# Patient Record
Sex: Male | Born: 1950 | Race: Black or African American | Hispanic: No | Smoking: Never smoker
Health system: Southern US, Community
[De-identification: ages and names within clinical notes are randomized; demographics above are authoritative.]

## PROBLEM LIST (undated history)

## (undated) ENCOUNTER — Emergency Department (HOSPITAL_COMMUNITY): Admission: EM | Payer: Medicare Other

## (undated) DIAGNOSIS — M1990 Unspecified osteoarthritis, unspecified site: Secondary | ICD-10-CM

## (undated) DIAGNOSIS — C61 Malignant neoplasm of prostate: Secondary | ICD-10-CM

## (undated) DIAGNOSIS — N186 End stage renal disease: Secondary | ICD-10-CM

## (undated) DIAGNOSIS — F32A Depression, unspecified: Secondary | ICD-10-CM

## (undated) DIAGNOSIS — R0600 Dyspnea, unspecified: Secondary | ICD-10-CM

## (undated) DIAGNOSIS — N289 Disorder of kidney and ureter, unspecified: Secondary | ICD-10-CM

## (undated) DIAGNOSIS — K279 Peptic ulcer, site unspecified, unspecified as acute or chronic, without hemorrhage or perforation: Secondary | ICD-10-CM

## (undated) DIAGNOSIS — I10 Essential (primary) hypertension: Secondary | ICD-10-CM

## (undated) DIAGNOSIS — H409 Unspecified glaucoma: Secondary | ICD-10-CM

## (undated) DIAGNOSIS — K861 Other chronic pancreatitis: Secondary | ICD-10-CM

## (undated) DIAGNOSIS — F419 Anxiety disorder, unspecified: Secondary | ICD-10-CM

## (undated) DIAGNOSIS — Z992 Dependence on renal dialysis: Secondary | ICD-10-CM

## (undated) HISTORY — PX: APPENDECTOMY: SHX54

## (undated) HISTORY — PX: OTHER SURGICAL HISTORY: SHX169

---

## 2017-08-13 DIAGNOSIS — H401133 Primary open-angle glaucoma, bilateral, severe stage: Secondary | ICD-10-CM | POA: Insufficient documentation

## 2019-12-09 ENCOUNTER — Ambulatory Visit: Payer: Medicare Other | Attending: Internal Medicine

## 2019-12-09 DIAGNOSIS — Z23 Encounter for immunization: Secondary | ICD-10-CM | POA: Insufficient documentation

## 2019-12-09 NOTE — Progress Notes (Signed)
   Covid-19 Vaccination Clinic  Name:  Manuel Ortega    MRN: [id number] DOB: 12/20/1950  12/09/2019  [name] was observed post Covid-19 immunization for 15 minutes without incidence. He was provided with Vaccine Information Sheet and instruction to access the V-Safe system.   [name] was instructed to call 911 with any severe reactions post vaccine: Marland Kitchen Difficulty breathing  . Swelling of your face and throat  . A fast heartbeat  . A bad rash all over your body  . Dizziness and weakness    Immunizations Administered    Name Date Dose VIS Date Route   Pfizer COVID-19 Vaccine 12/09/2019  8:26 AM 0.3 mL 09/23/2019 Intramuscular   Manufacturer: [place]   Lot: [id number]   [place]: [id number]

## 2020-01-03 ENCOUNTER — Ambulatory Visit: Payer: Medicare Other | Attending: Internal Medicine

## 2020-01-03 DIAGNOSIS — Z23 Encounter for immunization: Secondary | ICD-10-CM

## 2020-01-03 NOTE — Progress Notes (Signed)
   Covid-19 Vaccination Clinic  Name:  Armani Brar    MRN: [id number] DOB: 1951-09-21  01/03/2020  Mr. Cherian was observed post Covid-19 immunization for 15 minutes without incident. He was provided with Vaccine Information Sheet and instruction to access the V-Safe system.   Mr. Heppler was instructed to call 911 with any severe reactions post vaccine: Marland Kitchen Difficulty breathing  . Swelling of face and throat  . A fast heartbeat  . A bad rash all over body  . Dizziness and weakness   Immunizations Administered    Name Date Dose VIS Date Route   Pfizer COVID-19 Vaccine 01/03/2020  9:50 AM 0.3 mL 09/23/2019 Intramuscular   Manufacturer: [place]   Lot: [id number]   [place]: [id number]

## 2020-05-11 ENCOUNTER — Inpatient Hospital Stay (HOSPITAL_COMMUNITY)
Admission: EM | Admit: 2020-05-11 | Discharge: 2020-06-01 | DRG: 674 | Disposition: A | Discharge status: Skilled Nursing Facility | Payer: Medicare Other | Attending: Internal Medicine | Admitting: Internal Medicine

## 2020-05-11 ENCOUNTER — Emergency Department (HOSPITAL_COMMUNITY): Payer: Medicare Other

## 2020-05-11 ENCOUNTER — Other Ambulatory Visit: Payer: Self-pay

## 2020-05-11 ENCOUNTER — Encounter (HOSPITAL_COMMUNITY): Payer: Self-pay

## 2020-05-11 DIAGNOSIS — I10 Essential (primary) hypertension: Secondary | ICD-10-CM | POA: Diagnosis present

## 2020-05-11 DIAGNOSIS — E876 Hypokalemia: Secondary | ICD-10-CM | POA: Diagnosis present

## 2020-05-11 DIAGNOSIS — N179 Acute kidney failure, unspecified: Secondary | ICD-10-CM | POA: Diagnosis present

## 2020-05-11 DIAGNOSIS — I452 Bifascicular block: Secondary | ICD-10-CM | POA: Diagnosis present

## 2020-05-11 DIAGNOSIS — N3941 Urge incontinence: Secondary | ICD-10-CM | POA: Diagnosis present

## 2020-05-11 DIAGNOSIS — R55 Syncope and collapse: Secondary | ICD-10-CM | POA: Diagnosis not present

## 2020-05-11 DIAGNOSIS — R338 Other retention of urine: Secondary | ICD-10-CM | POA: Diagnosis present

## 2020-05-11 DIAGNOSIS — N401 Enlarged prostate with lower urinary tract symptoms: Secondary | ICD-10-CM | POA: Diagnosis present

## 2020-05-11 DIAGNOSIS — E871 Hypo-osmolality and hyponatremia: Secondary | ICD-10-CM | POA: Diagnosis not present

## 2020-05-11 DIAGNOSIS — R310 Gross hematuria: Secondary | ICD-10-CM | POA: Diagnosis present

## 2020-05-11 DIAGNOSIS — R9431 Abnormal electrocardiogram [ECG] [EKG]: Secondary | ICD-10-CM | POA: Diagnosis present

## 2020-05-11 DIAGNOSIS — D638 Anemia in other chronic diseases classified elsewhere: Secondary | ICD-10-CM | POA: Diagnosis not present

## 2020-05-11 DIAGNOSIS — Z9114 Patient's other noncompliance with medication regimen: Secondary | ICD-10-CM

## 2020-05-11 DIAGNOSIS — Z79899 Other long term (current) drug therapy: Secondary | ICD-10-CM

## 2020-05-11 DIAGNOSIS — N186 End stage renal disease: Secondary | ICD-10-CM | POA: Diagnosis present

## 2020-05-11 DIAGNOSIS — Z880 Allergy status to penicillin: Secondary | ICD-10-CM | POA: Diagnosis not present

## 2020-05-11 DIAGNOSIS — Z20822 Contact with and (suspected) exposure to covid-19: Secondary | ICD-10-CM | POA: Diagnosis present

## 2020-05-11 DIAGNOSIS — R066 Hiccough: Secondary | ICD-10-CM | POA: Diagnosis not present

## 2020-05-11 DIAGNOSIS — N136 Pyonephrosis: Secondary | ICD-10-CM | POA: Diagnosis present

## 2020-05-11 DIAGNOSIS — F419 Anxiety disorder, unspecified: Secondary | ICD-10-CM | POA: Diagnosis present

## 2020-05-11 DIAGNOSIS — F329 Major depressive disorder, single episode, unspecified: Secondary | ICD-10-CM | POA: Diagnosis present

## 2020-05-11 DIAGNOSIS — D62 Acute posthemorrhagic anemia: Secondary | ICD-10-CM | POA: Diagnosis not present

## 2020-05-11 DIAGNOSIS — R112 Nausea with vomiting, unspecified: Secondary | ICD-10-CM | POA: Diagnosis not present

## 2020-05-11 DIAGNOSIS — R748 Abnormal levels of other serum enzymes: Secondary | ICD-10-CM | POA: Diagnosis present

## 2020-05-11 DIAGNOSIS — D509 Iron deficiency anemia, unspecified: Secondary | ICD-10-CM | POA: Diagnosis present

## 2020-05-11 DIAGNOSIS — N19 Unspecified kidney failure: Secondary | ICD-10-CM | POA: Diagnosis not present

## 2020-05-11 DIAGNOSIS — E860 Dehydration: Secondary | ICD-10-CM | POA: Diagnosis present

## 2020-05-11 DIAGNOSIS — N138 Other obstructive and reflux uropathy: Secondary | ICD-10-CM | POA: Diagnosis not present

## 2020-05-11 DIAGNOSIS — E874 Mixed disorder of acid-base balance: Secondary | ICD-10-CM | POA: Diagnosis present

## 2020-05-11 DIAGNOSIS — N320 Bladder-neck obstruction: Secondary | ICD-10-CM | POA: Diagnosis present

## 2020-05-11 DIAGNOSIS — N1330 Unspecified hydronephrosis: Secondary | ICD-10-CM

## 2020-05-11 DIAGNOSIS — N1339 Other hydronephrosis: Secondary | ICD-10-CM | POA: Diagnosis not present

## 2020-05-11 DIAGNOSIS — N139 Obstructive and reflux uropathy, unspecified: Secondary | ICD-10-CM | POA: Diagnosis not present

## 2020-05-11 HISTORY — DX: Essential (primary) hypertension: I10

## 2020-05-11 HISTORY — DX: Unspecified glaucoma: H40.9

## 2020-05-11 HISTORY — DX: Depression, unspecified: F32.A

## 2020-05-11 LAB — COMPREHENSIVE METABOLIC PANEL
ALT: 9 U/L (ref 0–44)
AST: 12 U/L — ABNORMAL LOW (ref 15–41)
Albumin: 4.1 g/dL (ref 3.5–5.0)
Alkaline Phosphatase: 77 U/L (ref 38–126)
Anion gap: 29 — ABNORMAL HIGH (ref 5–15)
BUN: 127 mg/dL — ABNORMAL HIGH (ref 8–23)
CO2: 23 mmol/L (ref 22–32)
Calcium: 6.4 mg/dL — CL (ref 8.9–10.3)
Chloride: 85 mmol/L — ABNORMAL LOW (ref 98–111)
Creatinine, Ser: 18.17 mg/dL — ABNORMAL HIGH (ref 0.61–1.24)
GFR calc Af Amer: 3 mL/min — ABNORMAL LOW (ref >60)
GFR calc non Af Amer: 2 mL/min — ABNORMAL LOW (ref >60)
Glucose, Bld: 140 mg/dL — ABNORMAL HIGH (ref 70–99)
Potassium: 2.6 mmol/L — CL (ref 3.5–5.1)
Sodium: 137 mmol/L (ref 135–145)
Total Bilirubin: 0.6 mg/dL (ref 0.3–1.2)
Total Protein: 7.6 g/dL (ref 6.5–8.1)

## 2020-05-11 LAB — CBC
HCT: 29.5 % — ABNORMAL LOW (ref 39.0–52.0)
Hemoglobin: 9.8 g/dL — ABNORMAL LOW (ref 13.0–17.0)
MCH: 27.1 pg (ref 26.0–34.0)
MCHC: 33.2 g/dL (ref 30.0–36.0)
MCV: 81.5 fL (ref 80.0–100.0)
Platelets: 428 10*3/uL — ABNORMAL HIGH (ref 150–400)
RBC: 3.62 MIL/uL — ABNORMAL LOW (ref 4.22–5.81)
RDW: 12.6 % (ref 11.5–15.5)
WBC: 13.6 10*3/uL — ABNORMAL HIGH (ref 4.0–10.5)
nRBC: 0 % (ref 0.0–0.2)

## 2020-05-11 LAB — URINALYSIS, ROUTINE W REFLEX MICROSCOPIC
Bacteria, UA: NONE SEEN
Bilirubin Urine: NEGATIVE
Glucose, UA: NEGATIVE mg/dL
Ketones, ur: NEGATIVE mg/dL
Nitrite: NEGATIVE
Protein, ur: NEGATIVE mg/dL
Specific Gravity, Urine: 1.01 (ref 1.005–1.030)
pH: 6 (ref 5.0–8.0)

## 2020-05-11 LAB — RENAL FUNCTION PANEL
Albumin: 4.3 g/dL (ref 3.5–5.0)
Anion gap: 29 — ABNORMAL HIGH (ref 5–15)
BUN: 96 mg/dL — ABNORMAL HIGH (ref 8–23)
CO2: 22 mmol/L (ref 22–32)
Calcium: 6.4 mg/dL — CL (ref 8.9–10.3)
Chloride: 88 mmol/L — ABNORMAL LOW (ref 98–111)
Creatinine, Ser: 18.64 mg/dL — ABNORMAL HIGH (ref 0.61–1.24)
GFR calc Af Amer: 3 mL/min — ABNORMAL LOW (ref >60)
GFR calc non Af Amer: 2 mL/min — ABNORMAL LOW (ref >60)
Glucose, Bld: 138 mg/dL — ABNORMAL HIGH (ref 70–99)
Phosphorus: 5.4 mg/dL — ABNORMAL HIGH (ref 2.5–4.6)
Potassium: 2.7 mmol/L — CL (ref 3.5–5.1)
Sodium: 139 mmol/L (ref 135–145)

## 2020-05-11 LAB — LIPASE, BLOOD: Lipase: 190 U/L — ABNORMAL HIGH (ref 11–51)

## 2020-05-11 LAB — BLOOD GAS, VENOUS
Acid-Base Excess: 4.5 mmol/L — ABNORMAL HIGH (ref 0.0–2.0)
Bicarbonate: 25.4 mmol/L (ref 20.0–28.0)
O2 Saturation: 44.5 %
Patient temperature: 98.6
pCO2, Ven: 27.4 mmHg — ABNORMAL LOW (ref 44.0–60.0)
pH, Ven: 7.574 — ABNORMAL HIGH (ref 7.250–7.430)
pO2, Ven: 27.4 mmHg — CL (ref 32.0–45.0)

## 2020-05-11 LAB — PHOSPHORUS: Phosphorus: 5.2 mg/dL — ABNORMAL HIGH (ref 2.5–4.6)

## 2020-05-11 LAB — SARS CORONAVIRUS 2 BY RT PCR (HOSPITAL ORDER, PERFORMED IN ~~LOC~~ HOSPITAL LAB): SARS Coronavirus 2: NEGATIVE

## 2020-05-11 LAB — MAGNESIUM: Magnesium: 1.5 mg/dL — ABNORMAL LOW (ref 1.7–2.4)

## 2020-05-11 MED ORDER — POTASSIUM CHLORIDE 2 MEQ/ML IV SOLN
INTRAVENOUS | Status: DC
  Filled 2020-05-11 (×7): qty 1000

## 2020-05-11 MED ORDER — ZOLPIDEM TARTRATE 5 MG PO TABS
5.0000 mg | ORAL_TABLET | Freq: Once | ORAL | Status: AC
  Administered 2020-05-11: 5 mg via ORAL
  Filled 2020-05-11: qty 1

## 2020-05-11 MED ORDER — SODIUM CHLORIDE 0.9% FLUSH
3.0000 mL | Freq: Once | INTRAVENOUS | Status: AC
  Administered 2020-05-11: 3 mL via INTRAVENOUS

## 2020-05-11 MED ORDER — DOCUSATE SODIUM 100 MG PO CAPS
100.0000 mg | ORAL_CAPSULE | Freq: Two times a day (BID) | ORAL | Status: DC
  Administered 2020-05-13 – 2020-06-01 (×30): 100 mg via ORAL
  Filled 2020-05-11 (×34): qty 1

## 2020-05-11 MED ORDER — LACTATED RINGERS IV SOLN: INTRAVENOUS | Status: DC

## 2020-05-11 MED ORDER — ONDANSETRON HCL 4 MG/2ML IJ SOLN: 4.0000 mg | Freq: Once | INTRAMUSCULAR | Status: DC

## 2020-05-11 MED ORDER — CALCIUM GLUCONATE-NACL 2-0.675 GM/100ML-% IV SOLN
2.0000 g | Freq: Once | INTRAVENOUS | Status: AC
  Administered 2020-05-11: 2000 mg via INTRAVENOUS
  Filled 2020-05-11: qty 100

## 2020-05-11 MED ORDER — ACETAMINOPHEN 650 MG RE SUPP: 650.0000 mg | Freq: Four times a day (QID) | RECTAL | Status: DC | PRN

## 2020-05-11 MED ORDER — ACETAMINOPHEN 325 MG PO TABS: 650.0000 mg | ORAL_TABLET | Freq: Four times a day (QID) | ORAL | Status: DC | PRN

## 2020-05-11 MED ORDER — POTASSIUM CHLORIDE 10 MEQ/100ML IV SOLN
10.0000 meq | INTRAVENOUS | Status: AC
  Administered 2020-05-11: 10 meq via INTRAVENOUS
  Filled 2020-05-11: qty 100

## 2020-05-11 MED ORDER — LACTATED RINGERS IV BOLUS
1000.0000 mL | Freq: Once | INTRAVENOUS | Status: AC
  Administered 2020-05-11: 1000 mL via INTRAVENOUS

## 2020-05-11 MED ORDER — CALCIUM CARBONATE ANTACID 500 MG PO CHEW: 800.0000 mg | CHEWABLE_TABLET | Freq: Two times a day (BID) | ORAL | Status: DC

## 2020-05-11 MED ORDER — ONDANSETRON HCL 4 MG PO TABS: 4.0000 mg | ORAL_TABLET | Freq: Four times a day (QID) | ORAL | Status: DC | PRN

## 2020-05-11 MED ORDER — POTASSIUM CHLORIDE CRYS ER 20 MEQ PO TBCR
40.0000 meq | EXTENDED_RELEASE_TABLET | Freq: Once | ORAL | Status: AC
  Administered 2020-05-11: 40 meq via ORAL
  Filled 2020-05-11: qty 2

## 2020-05-11 MED ORDER — MAGNESIUM SULFATE 4 GM/100ML IV SOLN
4.0000 g | Freq: Once | INTRAVENOUS | Status: AC
  Administered 2020-05-11: 4 g via INTRAVENOUS
  Filled 2020-05-11: qty 100

## 2020-05-11 MED ORDER — POTASSIUM CHLORIDE 10 MEQ/100ML IV SOLN
10.0000 meq | INTRAVENOUS | Status: AC
  Administered 2020-05-11: 10 meq via INTRAVENOUS
  Filled 2020-05-11 (×2): qty 100

## 2020-05-11 MED ORDER — CALCIUM GLUCONATE-NACL 1-0.675 GM/50ML-% IV SOLN
1.0000 g | Freq: Once | INTRAVENOUS | Status: AC
  Administered 2020-05-11: 1000 mg via INTRAVENOUS
  Filled 2020-05-11: qty 50

## 2020-05-11 MED ORDER — LORAZEPAM 2 MG/ML IJ SOLN
1.0000 mg | Freq: Once | INTRAMUSCULAR | Status: AC
  Administered 2020-05-11: 1 mg via INTRAVENOUS
  Filled 2020-05-11: qty 1

## 2020-05-11 MED ORDER — ONDANSETRON HCL 4 MG/2ML IJ SOLN: 4.0000 mg | Freq: Four times a day (QID) | INTRAMUSCULAR | Status: DC | PRN

## 2020-05-11 NOTE — ED Notes (Signed)
Patient is resting comfortably. 

## 2020-05-11 NOTE — ED Provider Notes (Signed)
[facility] DEPT Provider Note   CSN: [id number] Arrival date & time: 05/11/20  1307     History Chief Complaint  Patient presents with  . Emesis  . Diarrhea    Manuel Ortega is a 69 y.o. male.  HPI      Manuel Ortega is a 69 y.o. male, with a history of HTN and depression, presenting to the ED with nausea and vomiting for the past 4 days. He also endorses decreased appetite, altered taste, and cough for the past week.  He states he had a fever up until yesterday.  He states he has not been followed by a medical provider for many years. He is treated for depression with a mental health agency out of [place].  Denies known history of renal disorder. He states he has had difficulty urinating for over a year.  He states he is sometimes incontinent of urine.  He states this will sometimes come when he feels very full in his bladder, but then he becomes incontinent of urine "and dribbles." Denies chest pain, shortness of breath, abdominal pain, hematochezia/melena, or any other complaints.  Past Medical History:  Diagnosis Date  . Depression   . Glaucoma   . Hypertension     There are no problems to display for this patient.   History reviewed. No pertinent surgical history.     Family History  Family history unknown: Yes    Social History   Tobacco Use  . Smoking status: Never Smoker  . Smokeless tobacco: Never Used  Vaping Use  . Vaping Use: Never used  Substance Use Topics  . Alcohol use: Never  . Drug use: Never    Home Medications Prior to Admission medications   Not on File    Allergies    Penicillins  Review of Systems   Review of Systems  Constitutional: Positive for appetite change, fatigue and fever (resolved). Negative for chills and diaphoresis.  Respiratory: Positive for cough. Negative for shortness of breath.   Cardiovascular: Negative for chest pain.  Gastrointestinal: Positive for nausea and vomiting.  Negative for abdominal pain and blood in stool.  Genitourinary: Positive for difficulty urinating. Negative for scrotal swelling and testicular pain.  Neurological: Positive for tremors and weakness.  All other systems reviewed and are negative.   Physical Exam Updated Vital Signs BP (!) 154/76 (BP Location: Left Arm)   Pulse 98   Temp 98.3 F (36.8 C) (Oral)   Resp 16   Ht 6\' 1"  (1.854 m)   SpO2 98%   Physical Exam Vitals and nursing note reviewed.  Constitutional:      General: He is not in acute distress.    Appearance: He is well-developed. He is not diaphoretic.  HENT:     Head: Normocephalic and atraumatic.     Mouth/Throat:     Mouth: Mucous membranes are moist.     Pharynx: Oropharynx is clear.  Eyes:     Conjunctiva/sclera: Conjunctivae normal.  Cardiovascular:     Rate and Rhythm: Normal rate and regular rhythm.     Pulses: Normal pulses.          Radial pulses are 2+ on the right side and 2+ on the left side.       Posterior tibial pulses are 2+ on the right side and 2+ on the left side.     Heart sounds: Normal heart sounds.     Comments: Tactile temperature in the extremities appropriate and equal bilaterally. Pulmonary:  Effort: Pulmonary effort is normal. No respiratory distress.     Breath sounds: Normal breath sounds.  Abdominal:     Palpations: Abdomen is soft.     Tenderness: There is no abdominal tenderness. There is no guarding.  Musculoskeletal:     Cervical back: Neck supple.     Right lower leg: No edema.     Left lower leg: No edema.  Lymphadenopathy:     Cervical: No cervical adenopathy.  Skin:    General: Skin is warm and dry.  Neurological:     Mental Status: He is alert and oriented to person, place, and time.     Comments: Tremors and muscle spasms present. Global muscular weakness.  Psychiatric:        Mood and Affect: Mood and affect normal.        Speech: Speech normal.        Behavior: Behavior normal.     ED Results /  Procedures / Treatments   Labs (all labs ordered are listed, but only abnormal results are displayed) Labs Reviewed  LIPASE, BLOOD - Abnormal; Notable for the following components:      Result Value   Lipase 190 (*)    All other components within normal limits  COMPREHENSIVE METABOLIC PANEL - Abnormal; Notable for the following components:   Potassium 2.6 (*)    Chloride 85 (*)    Glucose, Bld 140 (*)    BUN 127 (*)    Creatinine, Ser 18.17 (*)    Calcium 6.4 (*)    AST 12 (*)    GFR calc non Af Amer 2 (*)    GFR calc Af Amer 3 (*)    Anion gap 29 (*)    All other components within normal limits  CBC - Abnormal; Notable for the following components:   WBC 13.6 (*)    RBC 3.62 (*)    Hemoglobin 9.8 (*)    HCT 29.5 (*)    Platelets 428 (*)    All other components within normal limits  MAGNESIUM - Abnormal; Notable for the following components:   Magnesium 1.5 (*)    All other components within normal limits  PHOSPHORUS - Abnormal; Notable for the following components:   Phosphorus 5.2 (*)    All other components within normal limits  SARS CORONAVIRUS 2 BY RT PCR (HOSPITAL ORDER, [facility])  URINALYSIS, ROUTINE W REFLEX MICROSCOPIC    EKG EKG Interpretation  Date/Time:  Friday May 11 2020 16:53:55 EDT Ventricular Rate:  90 PR Interval:    QRS Duration: 113 QT Interval:  484 QTC Calculation: 593 R Axis:   -21 Text Interpretation: Sinus or ectopic atrial tachycardia Ventricular premature complex Incomplete RBBB and LAFB Prolonged QT No old tracing to compare Confirmed by Deno Etienne [phone]) on 05/11/2020 5:05:08 PM   Radiology CT ABDOMEN PELVIS WO CONTRAST  Result Date: 05/11/2020 CLINICAL DATA:  Acute renal failure. Nausea and vomiting. Diarrhea for 2 days. Dysuria. EXAM: CT ABDOMEN AND PELVIS WITHOUT CONTRAST TECHNIQUE: Multidetector CT imaging of the abdomen and pelvis was performed following the standard protocol without IV contrast.  COMPARISON:  None. FINDINGS: Lower chest: 5 mm pulmonary lymph node in the right lower lobe, series 4, image 2 is likely a perifissural intrapulmonary lymph node but nonspecific. Heart is normal in size. No pleural effusion. Hepatobiliary: No focal liver abnormality is seen. No gallstones, gallbladder wall thickening, or biliary dilatation. Pancreas: No ductal dilatation or inflammation. Single calcification in  the uncinate process of the pancreas. Spleen: Normal in size without focal abnormality. Adrenals/Urinary Tract: Bilateral adrenal thickening without dominant nodule. There is bilateral hydroureteronephrosis. Both ureters are dilated to bladder insertion. Bilateral perinephric edema that is symmetric. There is thinning of the bilateral renal parenchyma. Cortical cysts in the left kidney. There is marked urinary bladder distention with bladder spanning 23 cm cranial caudal extending into the right aspect of the abdomen. Bladder wall appears trabeculated inferiorly with probable multiple small diverticula. No definite bladder wall thickening or obvious focal bladder lesion. There is mass effect on the bladder base from prostate gland. Stomach/Bowel: Fluid levels in the stomach without evidence of gastric wall thickening. No abnormal gastric distension. Short of the small bowel is decompressed without obstruction, inflammation, or abnormal distention. Colonic diverticulosis throughout the entire colon, including the cecum. No diverticulitis. No colonic wall thickening or inflammatory change. Normal appendix. Vascular/Lymphatic: Aortic atherosclerosis. No aortic aneurysm. No bulky abdominopelvic adenopathy. Reproductive: Enlarged prostate gland spanning 5.5 x 5.3 x 5.4 cm (volume = 82 cm^3). Other: Fat in both inguinal canals. Tiny fat containing umbilical hernia. No ascites or free air. Musculoskeletal: Degenerative change throughout the spine. There are no acute or suspicious osseous abnormalities. IMPRESSION:  1. Marked urinary bladder distention with bladder wall trabeculation and probable multiple small diverticula. Bilateral hydroureteronephrosis, likely due to distended bladder. Suspect chronic bladder outlet obstruction. 2. Enlarged prostate gland causing mass effect on the bladder base. 3. Colonic diverticulosis without diverticulitis. 4. A 5 mm pulmonary lymph node in the right lower lobe is likely a perifissural intrapulmonary lymph node but nonspecific. Consider follow-up chest CT 1 year if patient is high-risk. Aortic Atherosclerosis (ICD10-I70.0). Electronically Signed   By: Keith Rake M.D.   On: 05/11/2020 17:59    Procedures Procedures (including critical care time)  Medications Ordered in ED Medications  calcium gluconate 1 g/ 50 mL sodium chloride IVPB (1,000 mg Intravenous New Bag/Given 05/11/20 1737)  potassium chloride 10 mEq in 100 mL IVPB (10 mEq Intravenous New Bag/Given 05/11/20 1802)  sodium chloride flush (NS) 0.9 % injection 3 mL (3 mLs Intravenous Given 05/11/20 1637)  lactated ringers bolus 1,000 mL (1,000 mLs Intravenous New Bag/Given 05/11/20 1806)  LORazepam (ATIVAN) injection 1 mg (1 mg Intravenous Given 05/11/20 1743)    ED Course  I have reviewed the triage vital signs and the nursing notes.  Pertinent labs & imaging results that were available during my care of the patient were reviewed by me and considered in my medical decision making (see chart for details).  Clinical Course as of May 11 1820  Fri May 11, 2020  1700 No abdominal tenderness.  Lipase(!): 190 [SJ]  51 Spoke with Dr. Joelyn Oms, nephrology. Replenish calcium and potassium. Rehydrate patient with a liter of LR. Patient will likely need dialysis.  Speak with the hospitalist to admit the patient to [facility] and facilitate transfer.   [SJ]  1751 Spoke with Dr. Dwyane Dee, hospitalist. Agrees to admit the patient to [facility].    [SJ]    Clinical Course User Index [SJ] Kaleea Penner, Helane Gunther, PA-C   MDM  Rules/Calculators/A&P                          Patient presents with a complaint of nausea and vomiting over the last several days.  I personally reviewed and interpreted the patient's imaging and lab results. Evidence of renal failure with significantly elevated creatinine and BUN. Hypocalcemia.  Hypokalemia.  Symptomatic  to this with tremors and muscular weakness.  Hypomagnesemia.  Prolonged QT interval. Distended bladder containing over 900 cc urine.  Foley catheter placed. Hydronephrosis also present.  Patient admitted via hospitalist with plan for transfer to [facility] to be evaluated by nephrology.  Expectation for dialysis.  Findings and plan of care discussed with Deno Etienne, DO. Dr. Tyrone Nine personally evaluated and examined this patient.  Vitals:   05/11/20 1411 05/11/20 1656 05/11/20 1715 05/11/20 1809  BP:  (!) 168/91 (!) 147/77 (!) 156/95  Pulse:  89  91  Resp:  15 (!) 9 17  Temp:      TempSrc:      SpO2:  100%  100%  Height: 6\' 1"  (1.854 m)        Final Clinical Impression(s) / ED Diagnoses Final diagnoses:  Acute renal failure, unspecified acute renal failure type (HCC)  Non-intractable vomiting with nausea, unspecified vomiting type  Hypocalcemia  Hypokalemia  Hypomagnesemia    Rx / DC Orders ED Discharge Orders    None       Lorayne Bender, PA-C 05/11/20 [place], [place], DO 05/11/20 2247

## 2020-05-11 NOTE — ED Notes (Signed)
Dr. Kennon Holter paged and has responded, patient is requesting something to help him sleep.

## 2020-05-11 NOTE — ED Notes (Signed)
Assumed care of patient at this time, nad noted, sr up x2, bed locked and low, call bell w/I reach.  Will continue to monitor. ° °

## 2020-05-11 NOTE — ED Triage Notes (Signed)
Per EMS- Patient c/o N/V/D x 2 days. Patient received zofran 4 mg IV prior to arrival to the ED.

## 2020-05-11 NOTE — H&P (Addendum)
History and Physical    Manuel Ortega ACZ:[id number] DOB: 10/12/1951 DOA: 05/11/2020  PCP: System, Pcp Not In   Patient coming from:  Home  I have personally briefly reviewed patient's old medical records in [place]  Chief Complaint: Nausea vomiting and abdominal pain.  HPI: Manuel Ortega is a 69 y.o. male with medical history significant of major depression and hypertension,  not taking any medication,  has no primary care physician, he has no medical care in the last 5 years presenting in the [facility] emergency department with nausea and vomiting for past 4 days.  He also report having decreased appetite,  intermittent cough but denies any weight loss.  He denies any history of kidney disease.  He also reports having difficulty urinating for over a year associated with intermittent dribbling of urine. He denies any family history of kidney disease, diabetes, Denies chest pain, shortness of breath, abdominal pain, hematochezia/melena, or any other complaints.  ED Course: He was hemodynamically stable except blood pressure. Heart rate 89, BP 147/77, RR 16, TEMP 98.3, SPO2 100% Labs: CBC: WBC 13.6, hemoglobin 9.8, hematocrit 29.5, platelet 428, sodium 137, potassium 2.6, chloride 85, bicarb 23, BUN 127, creatinine 18.17, calcium 6.4, anion gap 29, phosphorous 5.2, magnesium 1.5, lipase 190. CT Abdomen: Findings consistent with chronic bladder outlet obstruction.  Review of Systems: As per HPI otherwise 10 point review of systems negative.  Review of Systems  Constitutional: Positive for chills.  HENT: Negative.   Eyes: Negative.   Respiratory: Negative.   Cardiovascular: Negative.   Gastrointestinal: Positive for nausea and vomiting.  Genitourinary: Positive for dysuria, flank pain and frequency.  Neurological: Negative.   Psychiatric/Behavioral: Negative.     Past Medical History:  Diagnosis Date  . Depression   . Glaucoma   . Hypertension     History reviewed. No pertinent  surgical history.   reports that he has never smoked. He has never used smokeless tobacco. He reports that he does not drink alcohol and does not use drugs.  Allergies  Allergen Reactions  . Penicillins     Family History  Family history unknown: Yes   Family history reviewed and not pertinent   Prior to Admission medications   Medication Sig Start Date End Date Taking? Authorizing Provider  divalproex (DEPAKOTE) 125 MG DR tablet Take 125 mg by mouth 2 (two) times daily. 12/19/19  Yes [provider]  latanoprost (XALATAN) 0.005 % ophthalmic solution Place 1 drop into both eyes at bedtime.  12/19/19  Yes [provider]  QUEtiapine (SEROQUEL) 200 MG tablet Take 200 mg by mouth at bedtime.  11/14/19  Yes [provider]    Physical Exam: Vitals:   05/11/20 1656 05/11/20 1715 05/11/20 1809 05/11/20 1842  BP: (!) 168/91 (!) 147/77 (!) 156/95 (!) 151/83  Pulse: 89  91 83  Resp: 15 (!) 9 17 17   Temp:      TempSrc:      SpO2: 100%  100% 100%  Height:        Constitutional: NAD, calm, comfortable Vitals:   05/11/20 1656 05/11/20 1715 05/11/20 1809 05/11/20 1842  BP: (!) 168/91 (!) 147/77 (!) 156/95 (!) 151/83  Pulse: 89  91 83  Resp: 15 (!) 9 17 17   Temp:      TempSrc:      SpO2: 100%  100% 100%  Height:       Eyes: PERRL, lids and conjunctivae normal ENMT: Mucous membranes are moist. Posterior pharynx clear of  any exudate or lesions.Normal dentition.  Neck: normal, supple, no masses, no thyromegaly Respiratory: clear to auscultation bilaterally, no wheezing, no crackles. Normal respiratory effort. No accessory muscle use.  Cardiovascular: Regular rate and rhythm, no murmurs / rubs / gallops. No extremity edema. 2+ pedal pulses. No carotid bruits.  Abdomen:  Mild  Tenderness ++ , no masses palpated. No hepatosplenomegaly. Bowel sounds positive.  Musculoskeletal: no clubbing / cyanosis. No joint deformity upper and lower extremities. Good ROM, no  contractures. Normal muscle tone.  Skin: no rashes, lesions, ulcers. No induration Neurologic: CN 2-12 grossly intact. Sensation intact, DTR normal. Strength 5/5 in all 4.  Psychiatric: Normal judgment and insight. Alert and oriented x 3. Normal mood.     Labs on Admission: I have personally reviewed following labs and imaging studies  CBC: Recent Labs  Lab 05/11/20 1459  WBC 13.6*  HGB 9.8*  HCT 29.5*  MCV 81.5  PLT 604*   Basic Metabolic Panel: Recent Labs  Lab 05/11/20 1459  NA 137  K 2.6*  CL 85*  CO2 23  GLUCOSE 140*  BUN 127*  CREATININE 18.17*  CALCIUM 6.4*  MG 1.5*  PHOS 5.2*   GFR: CrCl cannot be calculated (Unknown ideal weight.). Liver Function Tests: Recent Labs  Lab 05/11/20 1459  AST 12*  ALT 9  ALKPHOS 77  BILITOT 0.6  PROT 7.6  ALBUMIN 4.1   Recent Labs  Lab 05/11/20 1459  LIPASE 190*   No results for input(s): AMMONIA in the last 168 hours. Coagulation Profile: No results for input(s): INR, PROTIME in the last 168 hours. Cardiac Enzymes: No results for input(s): CKTOTAL, CKMB, CKMBINDEX, TROPONINI in the last 168 hours. BNP (last 3 results) No results for input(s): PROBNP in the last 8760 hours. HbA1C: No results for input(s): HGBA1C in the last 72 hours. CBG: No results for input(s): GLUCAP in the last 168 hours. Lipid Profile: No results for input(s): CHOL, HDL, LDLCALC, TRIG, CHOLHDL, LDLDIRECT in the last 72 hours. Thyroid Function Tests: No results for input(s): TSH, T4TOTAL, FREET4, T3FREE, THYROIDAB in the last 72 hours. Anemia Panel: No results for input(s): VITAMINB12, FOLATE, FERRITIN, TIBC, IRON, RETICCTPCT in the last 72 hours. Urine analysis: No results found for: COLORURINE, APPEARANCEUR, LABSPEC, [place], GLUCOSEU, HGBUR, BILIRUBINUR, KETONESUR, PROTEINUR, UROBILINOGEN, NITRITE, LEUKOCYTESUR  Radiological Exams on Admission: CT ABDOMEN PELVIS WO CONTRAST  Result Date: 05/11/2020 CLINICAL DATA:  Acute renal  failure. Nausea and vomiting. Diarrhea for 2 days. Dysuria. EXAM: CT ABDOMEN AND PELVIS WITHOUT CONTRAST TECHNIQUE: Multidetector CT imaging of the abdomen and pelvis was performed following the standard protocol without IV contrast. COMPARISON:  None. FINDINGS: Lower chest: 5 mm pulmonary lymph node in the right lower lobe, series 4, image 2 is likely a perifissural intrapulmonary lymph node but nonspecific. Heart is normal in size. No pleural effusion. Hepatobiliary: No focal liver abnormality is seen. No gallstones, gallbladder wall thickening, or biliary dilatation. Pancreas: No ductal dilatation or inflammation. Single calcification in the uncinate process of the pancreas. Spleen: Normal in size without focal abnormality. Adrenals/Urinary Tract: Bilateral adrenal thickening without dominant nodule. There is bilateral hydroureteronephrosis. Both ureters are dilated to bladder insertion. Bilateral perinephric edema that is symmetric. There is thinning of the bilateral renal parenchyma. Cortical cysts in the left kidney. There is marked urinary bladder distention with bladder spanning 23 cm cranial caudal extending into the right aspect of the abdomen. Bladder wall appears trabeculated inferiorly with probable multiple small diverticula. No definite bladder wall thickening or obvious  focal bladder lesion. There is mass effect on the bladder base from prostate gland. Stomach/Bowel: Fluid levels in the stomach without evidence of gastric wall thickening. No abnormal gastric distension. Short of the small bowel is decompressed without obstruction, inflammation, or abnormal distention. Colonic diverticulosis throughout the entire colon, including the cecum. No diverticulitis. No colonic wall thickening or inflammatory change. Normal appendix. Vascular/Lymphatic: Aortic atherosclerosis. No aortic aneurysm. No bulky abdominopelvic adenopathy. Reproductive: Enlarged prostate gland spanning 5.5 x 5.3 x 5.4 cm (volume = 82  cm^3). Other: Fat in both inguinal canals. Tiny fat containing umbilical hernia. No ascites or free air. Musculoskeletal: Degenerative change throughout the spine. There are no acute or suspicious osseous abnormalities. IMPRESSION: 1. Marked urinary bladder distention with bladder wall trabeculation and probable multiple small diverticula. Bilateral hydroureteronephrosis, likely due to distended bladder. Suspect chronic bladder outlet obstruction. 2. Enlarged prostate gland causing mass effect on the bladder base. 3. Colonic diverticulosis without diverticulitis. 4. A 5 mm pulmonary lymph node in the right lower lobe is likely a perifissural intrapulmonary lymph node but nonspecific. Consider follow-up chest CT 1 year if patient is high-risk. Aortic Atherosclerosis (ICD10-I70.0). Electronically Signed   By: Keith Rake M.D.   On: 05/11/2020 17:59    EKG: Independently reviewed.  Ventricular premature complexes, incomplete right bundle branch block  Assessment/Plan Principal Problem:   Acute kidney injury ([place]) Active Problems:   Hypokalemia   Hypocalcemia   Hypomagnesemia   Nausea and vomiting   Acute kidney injury could be multifactorial: Patient might have CKD given uncontrolled hypertension. No baseline serum creatinine.  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 CT Abd: Findings consistent with chronic outlet obstruction Admit to progressive unit @ [facility] Continue IV fluids for hydration. Patient is hemodynamically stable, denies any chest pain, SOB, dizziness. Nephrology consulted, Dr. Joelyn Oms notified, Patient may need hemodialysis.  Hypokalemia :  This could be from nausea and vomiting. Replacement in progress,  recheck labs.  Hypocalcemia : Calcium Gluconate given once, recheck labs.  Nausea and vomiting: Continue IV Zofran and Phenergan as needed. CT abdomen finding consistent with chronic outlet  obstruction.  Leukocytosis: It could be reactive,  recheck CBC tomorrow. UA pending. Recheck result.  Elevated Lipase: Could have mild pancreatitis secondary to nausea and vomiting Denies central abdominal pain, recheck lipase. NPO except med, recheck lipase.   DVT prophylaxis:SCDS. Code Status: Full Family Communication: Discussed with patient at bed side. Disposition Plan: Anticipated discharge home once acute problem resolves. Consults called: Nephrology Dr. Joelyn Oms Admission status: Remain inpatient. Patient is not medically clear, acute renal failure requiring hemodialysis   Shawna Clamp MD [facility]   If 7PM-7AM, please contact night-coverage [email or web address] Password [facility]  05/11/2020, 6:43 PM

## 2020-05-11 NOTE — Consult Note (Addendum)
Manuel Ortega Admit Date: 05/11/2020 05/11/2020 Rexene Agent Requesting Physician:  Dwyane Dee MD  Reason for Consult:  Renal Failure, hypokalemia, hypocalcemia HPI:  84M PMH including glaucoma and depression who presented to the emergency room with several weeks of dysgeusia, nausea, vomiting, anorexia.  Symptoms are worsened over the past several days including 2 days ago and had severe vomiting for much of the night and after taking some over-the-counter sodium bicarbonate developed severe diarrhea persisting until presentation.  He does not follow regularly with primary care.  Patient has had weeks to months of urinary urge incontinence, weakened urinary stream.  No peripheral edema, no dyspnea, no chest pain.  He does not use NSAIDs.  No recent antibiotics.  No petechia, purpura.  ED evaluation revealed severe renal failure with creatinine of 18, BUN 127.  Electrolytes with a potassium of 2.6, bicarbonate 23, magnesium 1.5, calcium 6.4 with albumin of 4.1.  He has an anion gap of 29 and a venous blood gas pH of 7.57 consistent with a mixed metabolic alkalosis and acidosis.  CT of the abdomen demonstrated bilateral hydroureteronephrosis with both of them dilated to the bladder.  There was marked urinary bladder distention with a bladder spanning 23 cm.  There is mass-effect on the bladder from the prostate gland.  In the ED the patient has received 40 mEq of oral potassium chloride, 20 mEq potassium chloride IV, 1 g of calcium gluconate, 4 g of magnesium, 1 L of lactated Ringer's  Creatinine, Ser (mg/dL)  Date Value  05/11/2020 18.17 (H)  ] ROS IV Contrast no exposure Balance of 12 systems is negative w/ exceptions as above  PMH  Past Medical History:  Diagnosis Date  . Depression   . Glaucoma   . Hypertension    PSH History reviewed. No pertinent surgical history. FH  Family History  Family history unknown: Yes   [place]  reports that he has never smoked. He has never used  smokeless tobacco. He reports that he does not drink alcohol and does not use drugs. Allergies  Allergies  Allergen Reactions  . Penicillins    Home medications Prior to Admission medications   Medication Sig Start Date End Date Taking? Authorizing Provider  divalproex (DEPAKOTE) 125 MG DR tablet Take 125 mg by mouth 2 (two) times daily. 12/19/19  Yes [provider]  latanoprost (XALATAN) 0.005 % ophthalmic solution Place 1 drop into both eyes at bedtime.  12/19/19  Yes [provider]  QUEtiapine (SEROQUEL) 200 MG tablet Take 200 mg by mouth at bedtime.  11/14/19  Yes [provider]    Current Medications Scheduled Meds: . potassium chloride  40 mEq Oral Once   Continuous Infusions: . magnesium sulfate bolus IVPB 4 g (05/11/20 1929)   PRN Meds:.  CBC Recent Labs  Lab 05/11/20 1459  WBC 13.6*  HGB 9.8*  HCT 29.5*  MCV 81.5  PLT 008*   Basic Metabolic Panel Recent Labs  Lab 05/11/20 1459  NA 137  K 2.6*  CL 85*  CO2 23  GLUCOSE 140*  BUN 127*  CREATININE 18.17*  CALCIUM 6.4*  PHOS 5.2*    Physical Exam  Blood pressure (!) 160/84, pulse 84, temperature 99 F (37.2 C), temperature source Oral, resp. rate 16, height 6\' 1"  (1.854 m), SpO2 100 %. GEN: NAD, conversant, lying flat in bed ENT: NCAT EYES: EOMI CV: RRR without rub PULM: Clear bilaterally ABD: Mildly tender throughout, no suprapubic fullness GU: Foley catheter present SKIN: No rashes or  lesions EXT: No peripheral edema NEURO: Nonfocal  Assessment 61M severe renal failure, suspect subacute with uremia present.  Complicated by hypokalemia, hypocalcemia, mixed metabolic acidosis and alkalosis.  I think this is been going on for quite some time, supported by his hypocalcemia, and the severity of his bladder distention.  He might not recover quickly with placement of a Foley catheter, he might not recover at all.  I suspect that his acidosis with increased anion gap is from renal  failure and his alkalosis is a contraction alkalosis with loss of gastric contents from nausea and vomiting and potentially from his use of sodium bicarbonate.  Hypokalemia is a little bit unusual in the setting of obstruction but likely related to poor oral intake and gastrointestinal losses.  Similar from hypomagnesemia.  There is renal failure is severe and uremia is present, there is potential that I will been improve with relief of obstruction and hydration.  We will follow his course overnight, and consider dialysis again in the morning.  1. AKI from obstructive uropathy, significant uremia present 2. 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 Hypokalemia, likely due to gastrointestinal losses 4. Hypocalcemia from severity of renal failure 5. Hypomagnesemia from gastrointestinal losses 6. Metabolic acidosis and alkalosis, from renal failure and gatric osses + PO NaHCO3 intake 7. Depression  Plan 1. Continue volume expansion with LR, at 125 mL/h 2. Additional 20 mEq of IV potassium 3. 2 g of calcium gluconate in addition 4. Start twice daily Tums 5. Check PSA 6. Will eventually require urology eval 7. Trend BMP every 6 hours 8. Admit to [facility] for potential dialysis 9. N.p.o. after midnight for potential vascular access in the morning 10. Daily weights, Daily Renal Panel, Strict I/Os, Avoid nephrotoxins (NSAIDs, judicious IV Contrast)    Rexene Agent  05/11/2020, 8:02 PM

## 2020-05-11 NOTE — ED Notes (Signed)
Indwelling is patent and draining well with pink tinged drainage.  Will continue to monitor.

## 2020-05-12 ENCOUNTER — Inpatient Hospital Stay (HOSPITAL_COMMUNITY): Payer: Medicare Other

## 2020-05-12 HISTORY — PX: IR FLUORO GUIDE CV LINE RIGHT: IMG2283

## 2020-05-12 HISTORY — PX: IR US GUIDE VASC ACCESS RIGHT: IMG2390

## 2020-05-12 LAB — CBC
HCT: 28.4 % — ABNORMAL LOW (ref 39.0–52.0)
Hemoglobin: 9.5 g/dL — ABNORMAL LOW (ref 13.0–17.0)
MCH: 27.7 pg (ref 26.0–34.0)
MCHC: 33.5 g/dL (ref 30.0–36.0)
MCV: 82.8 fL (ref 80.0–100.0)
Platelets: 354 10*3/uL (ref 150–400)
RBC: 3.43 MIL/uL — ABNORMAL LOW (ref 4.22–5.81)
RDW: 13 % (ref 11.5–15.5)
WBC: 16.2 10*3/uL — ABNORMAL HIGH (ref 4.0–10.5)
nRBC: 0 % (ref 0.0–0.2)

## 2020-05-12 LAB — COMPREHENSIVE METABOLIC PANEL
ALT: 6 U/L (ref 0–44)
AST: 11 U/L — ABNORMAL LOW (ref 15–41)
Albumin: 3.4 g/dL — ABNORMAL LOW (ref 3.5–5.0)
Alkaline Phosphatase: 68 U/L (ref 38–126)
Anion gap: 22 — ABNORMAL HIGH (ref 5–15)
BUN: 131 mg/dL — ABNORMAL HIGH (ref 8–23)
CO2: 23 mmol/L (ref 22–32)
Calcium: 7.1 mg/dL — ABNORMAL LOW (ref 8.9–10.3)
Chloride: 91 mmol/L — ABNORMAL LOW (ref 98–111)
Creatinine, Ser: 17.02 mg/dL — ABNORMAL HIGH (ref 0.61–1.24)
GFR calc Af Amer: 3 mL/min — ABNORMAL LOW (ref >60)
GFR calc non Af Amer: 3 mL/min — ABNORMAL LOW (ref >60)
Glucose, Bld: 119 mg/dL — ABNORMAL HIGH (ref 70–99)
Potassium: 3.6 mmol/L (ref 3.5–5.1)
Sodium: 136 mmol/L (ref 135–145)
Total Bilirubin: 0.6 mg/dL (ref 0.3–1.2)
Total Protein: 6.1 g/dL — ABNORMAL LOW (ref 6.5–8.1)

## 2020-05-12 LAB — RENAL FUNCTION PANEL
Albumin: 3.4 g/dL — ABNORMAL LOW (ref 3.5–5.0)
Albumin: 3.5 g/dL (ref 3.5–5.0)
Anion gap: 25 — ABNORMAL HIGH (ref 5–15)
Anion gap: 21 — ABNORMAL HIGH (ref 5–15)
BUN: 102 mg/dL — ABNORMAL HIGH (ref 8–23)
BUN: 126 mg/dL — ABNORMAL HIGH (ref 8–23)
CO2: 22 mmol/L (ref 22–32)
CO2: 23 mmol/L (ref 22–32)
Calcium: 6.9 mg/dL — ABNORMAL LOW (ref 8.9–10.3)
Calcium: 6.7 mg/dL — ABNORMAL LOW (ref 8.9–10.3)
Chloride: 91 mmol/L — ABNORMAL LOW (ref 98–111)
Chloride: 93 mmol/L — ABNORMAL LOW (ref 98–111)
Creatinine, Ser: 17.6 mg/dL — ABNORMAL HIGH (ref 0.61–1.24)
Creatinine, Ser: 16.69 mg/dL — ABNORMAL HIGH (ref 0.61–1.24)
GFR calc Af Amer: 3 mL/min — ABNORMAL LOW (ref >60)
GFR calc Af Amer: 3 mL/min — ABNORMAL LOW (ref >60)
GFR calc non Af Amer: 2 mL/min — ABNORMAL LOW (ref >60)
GFR calc non Af Amer: 3 mL/min — ABNORMAL LOW (ref >60)
Glucose, Bld: 121 mg/dL — ABNORMAL HIGH (ref 70–99)
Glucose, Bld: 107 mg/dL — ABNORMAL HIGH (ref 70–99)
Phosphorus: 6.7 mg/dL — ABNORMAL HIGH (ref 2.5–4.6)
Phosphorus: 6.5 mg/dL — ABNORMAL HIGH (ref 2.5–4.6)
Potassium: 3.8 mmol/L (ref 3.5–5.1)
Potassium: 3.7 mmol/L (ref 3.5–5.1)
Sodium: 138 mmol/L (ref 135–145)
Sodium: 137 mmol/L (ref 135–145)

## 2020-05-12 LAB — APTT: aPTT: 28 seconds (ref 24–36)

## 2020-05-12 LAB — PROTIME-INR
INR: 1.1 (ref 0.8–1.2)
Prothrombin Time: 13.5 seconds (ref 11.4–15.2)

## 2020-05-12 LAB — HIV ANTIBODY (ROUTINE TESTING W REFLEX): HIV Screen 4th Generation wRfx: NONREACTIVE

## 2020-05-12 LAB — PSA: Prostatic Specific Antigen: 28.6 ng/mL — ABNORMAL HIGH (ref 0.00–4.00)

## 2020-05-12 MED ORDER — ALTEPLASE 2 MG IJ SOLR: 2.0000 mg | Freq: Once | INTRAMUSCULAR | Status: DC | PRN

## 2020-05-12 MED ORDER — CHLORHEXIDINE GLUCONATE CLOTH 2 % EX PADS
6.0000 | MEDICATED_PAD | Freq: Every day | CUTANEOUS | Status: DC
  Administered 2020-05-13 – 2020-05-15 (×3): 6 via TOPICAL

## 2020-05-12 MED ORDER — LIDOCAINE-EPINEPHRINE 1 %-1:100000 IJ SOLN
INTRAMUSCULAR | Status: AC | PRN
  Administered 2020-05-12: 10 mL

## 2020-05-12 MED ORDER — PENTAFLUOROPROP-TETRAFLUOROETH EX AERO: 1.0000 "application " | INHALATION_SPRAY | CUTANEOUS | Status: DC | PRN

## 2020-05-12 MED ORDER — SODIUM CHLORIDE 0.9 % IV SOLN: 100.0000 mL | INTRAVENOUS | Status: DC | PRN

## 2020-05-12 MED ORDER — SODIUM CHLORIDE 0.9 % IV SOLN
1.0000 g | Freq: Once | INTRAVENOUS | Status: AC
  Administered 2020-05-12: 1 g via INTRAVENOUS
  Filled 2020-05-12: qty 1

## 2020-05-12 MED ORDER — HEPARIN SODIUM (PORCINE) 1000 UNIT/ML IJ SOLN
INTRAMUSCULAR | Status: AC
  Filled 2020-05-12: qty 1

## 2020-05-12 MED ORDER — HEPARIN SODIUM (PORCINE) 1000 UNIT/ML DIALYSIS: 1000.0000 [IU] | INTRAMUSCULAR | Status: DC | PRN

## 2020-05-12 MED ORDER — LIDOCAINE HCL (PF) 1 % IJ SOLN: 5.0000 mL | INTRAMUSCULAR | Status: DC | PRN

## 2020-05-12 MED ORDER — LIDOCAINE-EPINEPHRINE 1 %-1:100000 IJ SOLN
INTRAMUSCULAR | Status: AC
  Filled 2020-05-12: qty 1

## 2020-05-12 MED ORDER — FENTANYL CITRATE (PF) 100 MCG/2ML IJ SOLN
INTRAMUSCULAR | Status: AC
  Filled 2020-05-12: qty 2

## 2020-05-12 MED ORDER — CALCIUM CARBONATE ANTACID 500 MG PO CHEW
800.0000 mg | CHEWABLE_TABLET | Freq: Three times a day (TID) | ORAL | Status: DC
  Administered 2020-05-13 – 2020-06-01 (×58): 800 mg via ORAL
  Filled 2020-05-12 (×58): qty 4

## 2020-05-12 MED ORDER — FENTANYL CITRATE (PF) 100 MCG/2ML IJ SOLN
INTRAMUSCULAR | Status: AC | PRN
  Administered 2020-05-12 (×2): 50 ug via INTRAVENOUS

## 2020-05-12 MED ORDER — DEXTROSE 5 % IV SOLN
0.5000 g | Freq: Two times a day (BID) | INTRAVENOUS | Status: DC
  Administered 2020-05-13 – 2020-05-14 (×3): 0.5 g via INTRAVENOUS
  Filled 2020-05-12 (×7): qty 0.5

## 2020-05-12 MED ORDER — LIDOCAINE-PRILOCAINE 2.5-2.5 % EX CREA
1.0000 "application " | TOPICAL_CREAM | CUTANEOUS | Status: DC | PRN
  Filled 2020-05-12: qty 5

## 2020-05-12 NOTE — Progress Notes (Signed)
PROGRESS NOTE    Manuel Ortega  ZOX:[id number] DOB: 02-02-1951 DOA: 05/11/2020.  PCP: System, Pcp Not In    Brief Narrative: Manuel Ortega is a 69 y.o. male with medical history significant of major depression and hypertension,  not taking any medication,  has no primary care physician, he has no medical care in the last 5 years presenting in the [facility] emergency department with nausea and vomiting for past 4 days.  He also report having decreased appetite,  intermittent cough but denies any weight loss.  He denies any history of kidney disease.  He also reports having difficulty urinating for over a year associated with intermittent dribbling of urine. He denies any family history of kidney disease, diabetes, Denies chest pain, shortness of breath, abdominal pain, hematochezia/melena, or any other complaints.  ED Course: He was hemodynamically stable except blood pressure. Heart rate 89, BP 147/77, RR 16, TEMP 98.3, SPO2 100% Labs: CBC: WBC 13.6, hemoglobin 9.8, hematocrit 29.5, platelet 428, sodium 137, potassium 2.6, chloride 85, bicarb 23, BUN 127, creatinine 18.17, calcium 6.4, anion gap 29, phosphorous 5.2, magnesium 1.5, lipase 190. CT Abdomen: Findings consistent with chronic bladder outlet obstruction.  Nephrology consulted, Awaiting transfer to [facility] for Hemodialysis, IR consulted for Tunneled catheter for HD. He denies any symptoms, feels fine. UA+ , Given penicillin allergy, pharmacy consulted for dosing , started on aztreonam.  Assessment & Plan:   Principal Problem:   Acute kidney injury ([place]) Active Problems:   Hypokalemia   Hypocalcemia   Hypomagnesemia   Nausea and vomiting   Acute kidney injury could be multifactorial: >>> Improving. Patient might have CKD given uncontrolled hypertension. No baseline serum creatinine.  Creatinine 18.0 POA  Trending down to 16.8 Dehydration from nausea and vomiting, chronic outlet obstruction from enlarged prostate. CT Abd: Findings  consistent with chronic outlet obstruction Admit to progressive unit @ [place] Continue IV fluids for hydration. Patient is hemodynamically stable, denies any chest pain, SOB, dizziness. Nephrology consulted, Dr. Joelyn Oms notified, Patient may need hemodialysis. Patient is going to get tunnelled catheter by IR for hemodialysis. Avoid nephrotoxic medications.  Hypokalemia : ->>  Improved. This could be from nausea and vomiting. K 2.6 >> 3.7   Hypocalcemia :->>  Improving Calcium Gluconate given once, recheck labs.  Nausea and vomiting: - >> Resolved.  Continue IV Zofran and Phenergan as needed. CT abdomen finding consistent with chronic outlet obstruction.  Leukocytosis: It could be reactive,  UA LE+, Hb+.  Pharmacy consulted for dosing. started on aztreonam  For UTI  Elevated Lipase: Could have mild pancreatitis secondary to nausea and vomiting Denies central abdominal pain, recheck lipase. NPO except med, recheck lipase.   DVT prophylaxis:SCDS. Code Status: Full Family Communication: Discussed with patient at bed side. Disposition Plan: Anticipated discharge home once acute problem resolves. Consults called: Nephrology Dr. Joelyn Oms Admission status: Remain inpatient. Patient is not medically clear, acute renal failure requiring hemodialysis   Consultants:    Nephrology  Procedures: acute renal failure requiring hemodialysis   Antimicrobials:  Anti-infectives (From admission, onward)   Start     Dose/Rate Route Frequency Ordered Stop   05/12/20 1545  aztreonam (AZACTAM) 1 g in sodium chloride 0.9 % 100 mL IVPB     Discontinue     1 g 200 mL/hr over 30 Minutes Intravenous  Once 05/12/20 1533       Subjective: Patient was seen and examined at bedside.  No overnight events.  Patient reports nausea and vomiting has resolved.  He feels little  better.  Objective: Vitals:   05/12/20 0655 05/12/20 0918 05/12/20 1155 05/12/20 1345  BP: (!) 167/88 (!) 129/80 (!)  142/74 (!) 135/64  Pulse: 80 79 87 83  Resp: 13 16 17 17   Temp:  98.2 F (36.8 C)    TempSrc:  Oral    SpO2: 99% 100% 100% 100%  Height:        Intake/Output Summary (Last 24 hours) at 05/12/2020 1520 Last data filed at 05/12/2020 0920 Gross per 24 hour  Intake 3591.2 ml  Output 1800 ml  Net 1791.2 ml   There were no vitals filed for this visit.  Examination:  General exam: Appears calm and comfortable  Respiratory system: Clear to auscultation. Respiratory effort normal. Cardiovascular system: S1 & S2 heard, RRR. No JVD, murmurs, rubs, gallops or clicks. No pedal edema. Gastrointestinal system: Abdomen is nondistended, soft and nontender. No organomegaly or masses felt. Normal bowel sounds heard. Central nervous system: Alert and oriented. No focal neurological deficits. Extremities: Symmetric 5 x 5 power. Skin: No rashes, lesions or ulcers Psychiatry: Judgement and insight appear normal. Mood & affect appropriate.     Data Reviewed: I have personally reviewed following labs and imaging studies  CBC: Recent Labs  Lab 05/11/20 1459 05/12/20 0502  WBC 13.6* 16.2*  HGB 9.8* 9.5*  HCT 29.5* 28.4*  MCV 81.5 82.8  PLT 428* 150   Basic Metabolic Panel: Recent Labs  Lab 05/11/20 1459 05/12/20 0035 05/12/20 0502 05/12/20 1042  NA 139  137 138 136 137  K 2.7*  2.6* 3.8 3.6 3.7  CL 88*  85* 91* 91* 93*  CO2 22  23 22 23 23   GLUCOSE 138*  140* 121* 119* 107*  BUN 96*  127* 102* 131* 126*  CREATININE 18.64*  18.17* 17.60* 17.02* 16.69*  CALCIUM 6.4*  6.4* 6.9* 7.1* 6.7*  MG 1.5*  --   --   --   PHOS 5.4*  5.2* 6.7*  --  6.5*   GFR: CrCl cannot be calculated (Unknown ideal weight.). Liver Function Tests: Recent Labs  Lab 05/11/20 1459 05/12/20 0035 05/12/20 0502 05/12/20 1042  AST 12*  --  11*  --   ALT 9  --  6  --   ALKPHOS 77  --  68  --   BILITOT 0.6  --  0.6  --   PROT 7.6  --  6.1*  --   ALBUMIN 4.3  4.1 3.4* 3.4* 3.5   Recent Labs  Lab  05/11/20 1459  LIPASE 190*   No results for input(s): AMMONIA in the last 168 hours. Coagulation Profile: Recent Labs  Lab 05/12/20 0502  INR 1.1   Cardiac Enzymes: No results for input(s): CKTOTAL, CKMB, CKMBINDEX, TROPONINI in the last 168 hours. BNP (last 3 results) No results for input(s): PROBNP in the last 8760 hours. HbA1C: No results for input(s): HGBA1C in the last 72 hours. CBG: No results for input(s): GLUCAP in the last 168 hours. Lipid Profile: No results for input(s): CHOL, HDL, LDLCALC, TRIG, CHOLHDL, LDLDIRECT in the last 72 hours. Thyroid Function Tests: No results for input(s): TSH, T4TOTAL, FREET4, T3FREE, THYROIDAB in the last 72 hours. Anemia Panel: No results for input(s): VITAMINB12, FOLATE, FERRITIN, TIBC, IRON, RETICCTPCT in the last 72 hours. Sepsis Labs: No results for input(s): PROCALCITON, LATICACIDVEN in the last 168 hours.  Recent Results (from the past 240 hour(s))  SARS Coronavirus 2 by RT PCR (hospital order, performed in [facility] hospital lab) Nasopharyngeal Nasopharyngeal Swab  Status: None   Collection Time: 05/11/20  5:41 PM   Specimen: Nasopharyngeal Swab  Result Value Ref Range Status   SARS Coronavirus 2 NEGATIVE NEGATIVE Final    Comment: (NOTE) SARS-CoV-2 target nucleic acids are NOT DETECTED.  The SARS-CoV-2 RNA is generally detectable in upper and lower respiratory specimens during the acute phase of infection. The lowest concentration of SARS-CoV-2 viral copies this assay can detect is 250 copies / mL. A negative result does not preclude SARS-CoV-2 infection and should not be used as the sole basis for treatment or other patient management decisions.  A negative result may occur with improper specimen collection / handling, submission of specimen other than nasopharyngeal swab, presence of viral mutation(s) within the areas targeted by this assay, and inadequate number of viral copies (<250 copies / mL). A negative  result must be combined with clinical observations, patient history, and epidemiological information.  Fact Sheet for Patients:   StrictlyIdeas.no  Fact Sheet for Healthcare Providers: BankingDealers.co.za  This test is not yet approved or  cleared by the [place] FDA and has been authorized for detection and/or diagnosis of SARS-CoV-2 by FDA under an Emergency Use Authorization (EUA).  This EUA will remain in effect (meaning this test can be used) for the duration of the COVID-19 declaration under Section 564(b)(1) of the Act, 21 U.S.C. section 360bbb-3(b)(1), unless the authorization is terminated or revoked sooner.  Performed at [facility], [place] [address] [place], [place] [address]      Radiology Studies: CT ABDOMEN PELVIS WO CONTRAST  Result Date: 05/11/2020 CLINICAL DATA:  Acute renal failure. Nausea and vomiting. Diarrhea for 2 days. Dysuria. EXAM: CT ABDOMEN AND PELVIS WITHOUT CONTRAST TECHNIQUE: Multidetector CT imaging of the abdomen and pelvis was performed following the standard protocol without IV contrast. COMPARISON:  None. FINDINGS: Lower chest: 5 mm pulmonary lymph node in the right lower lobe, series 4, image 2 is likely a perifissural intrapulmonary lymph node but nonspecific. Heart is normal in size. No pleural effusion. Hepatobiliary: No focal liver abnormality is seen. No gallstones, gallbladder wall thickening, or biliary dilatation. Pancreas: No ductal dilatation or inflammation. Single calcification in the uncinate process of the pancreas. Spleen: Normal in size without focal abnormality. Adrenals/Urinary Tract: Bilateral adrenal thickening without dominant nodule. There is bilateral hydroureteronephrosis. Both ureters are dilated to bladder insertion. Bilateral perinephric edema that is symmetric. There is thinning of the bilateral renal parenchyma. Cortical cysts in the left kidney. There is  marked urinary bladder distention with bladder spanning 23 cm cranial caudal extending into the right aspect of the abdomen. Bladder wall appears trabeculated inferiorly with probable multiple small diverticula. No definite bladder wall thickening or obvious focal bladder lesion. There is mass effect on the bladder base from prostate gland. Stomach/Bowel: Fluid levels in the stomach without evidence of gastric wall thickening. No abnormal gastric distension. Short of the small bowel is decompressed without obstruction, inflammation, or abnormal distention. Colonic diverticulosis throughout the entire colon, including the cecum. No diverticulitis. No colonic wall thickening or inflammatory change. Normal appendix. Vascular/Lymphatic: Aortic atherosclerosis. No aortic aneurysm. No bulky abdominopelvic adenopathy. Reproductive: Enlarged prostate gland spanning 5.5 x 5.3 x 5.4 cm (volume = 82 cm^3). Other: Fat in both inguinal canals. Tiny fat containing umbilical hernia. No ascites or free air. Musculoskeletal: Degenerative change throughout the spine. There are no acute or suspicious osseous abnormalities. IMPRESSION: 1. Marked urinary bladder distention with bladder wall trabeculation and probable multiple small diverticula. Bilateral hydroureteronephrosis, likely due to  distended bladder. Suspect chronic bladder outlet obstruction. 2. Enlarged prostate gland causing mass effect on the bladder base. 3. Colonic diverticulosis without diverticulitis. 4. A 5 mm pulmonary lymph node in the right lower lobe is likely a perifissural intrapulmonary lymph node but nonspecific. Consider follow-up chest CT 1 year if patient is high-risk. Aortic Atherosclerosis (ICD10-I70.0). Electronically Signed   By: Keith Rake M.D.   On: 05/11/2020 17:59    Scheduled Meds: . calcium carbonate  800 mg of elemental calcium Oral TID  . docusate sodium  100 mg Oral BID   Continuous Infusions: . lactated ringers with kcl 100 mL/hr  at 05/12/20 0025  . lactated ringers       LOS: 1 day    Time spent: 25 mins.    Shawna Clamp, MD [facility]   If 7PM-7AM, please contact night-coverage

## 2020-05-12 NOTE — Progress Notes (Signed)
MEDICATION-RELATED CONSULT NOTE   IR Procedure Consult - Anticoagulant/Antiplatelet PTA/Inpatient Med List Review by Pharmacist    Procedure: placement of non-tunneled HD catheter    Completed: 7/31 at 1730  Post-Procedural bleeding risk per IR MD assessment: STANDARD  Antithrombotic medications on inpatient or PTA profile prior to procedure:  None, SCDs only    Recommended restart time per IR Post-Procedure Guidelines:  n/a   Other considerations:  Spoke with MD, opted to continue SCDs only rather than starting UFH   Plan:     Continue SCDs; no further action needed  Reuel Boom, PharmD, BCPS [phone] 05/12/2020, 6:54 PM

## 2020-05-12 NOTE — Progress Notes (Addendum)
[facility] Progress Note   Subjective/Interval Events:   Remains in [facility] ED awaiting bed at [facility] for transfer.  I/Os 3.6 / 1.8 since presentation - all outs are urine.  Cr remains ~17 as of 10:40 this AM. K now normal after supplement.  He's feeling a bit improved this AM - no further emesis, diarrhea, perioral tingling.  Hopeful kidneys will recover.  Only c/o dysuria since foley placed.  Occ hiccups today.  Is NPO for HD catheter placement.  Objective Vitals:   05/12/20 0504 05/12/20 0554 05/12/20 0655 05/12/20 0918  BP:  (!) 138/78 (!) 167/88 (!) 129/80  Pulse: 85 78 80 79  Resp: 15 12 13 16   Temp:    98.2 F (36.8 C)  TempSrc:    Oral  SpO2: 100% 100% 99% 100%  Height:       Physical Exam General: nontoxic Heart: RRR, no rub Lungs: clear Abdomen: soft, NABS Extremities: no edema GU: foley draining blood tinged urine without clots Neuro: tremulous but no asterixis, some difficulty with quick recall of details but pretty sharp  Additional Objective Labs: Basic Metabolic Panel: Recent Labs  Lab 05/11/20 1459 05/11/20 1459 05/12/20 0035 05/12/20 0502 05/12/20 1042  NA 139   137   < > 138 136 137  K 2.7*   2.6*   < > 3.8 3.6 3.7  CL 88*   85*   < > 91* 91* 93*  CO2 22   23   < > 22 23 23   GLUCOSE 138*   140*   < > 121* 119* 107*  BUN 96*   127*   < > 102* 131* 126*  CREATININE 18.64*   18.17*   < > 17.60* 17.02* 16.69*  CALCIUM 6.4*   6.4*   < > 6.9* 7.1* 6.7*  PHOS 5.4*   5.2*  --  6.7*  --  6.5*   < > = values in this interval not displayed.   Liver Function Tests: Recent Labs  Lab 05/11/20 1459 05/11/20 1459 05/12/20 0035 05/12/20 0502 05/12/20 1042  AST 12*  --   --  11*  --   ALT 9  --   --  6  --   ALKPHOS 77  --   --  68  --   BILITOT 0.6  --   --  0.6  --   PROT 7.6  --   --  6.1*  --   ALBUMIN 4.3   4.1   < > 3.4* 3.4* 3.5   < > = values in this interval not displayed.   Recent Labs  Lab 05/11/20 1459  LIPASE 190*    CBC: Recent Labs  Lab 05/11/20 1459 05/12/20 0502  WBC 13.6* 16.2*  HGB 9.8* 9.5*  HCT 29.5* 28.4*  MCV 81.5 82.8  PLT 428* 354   Blood Culture No results found for: SDES, SPECREQUEST, CULT, REPTSTATUS  Cardiac Enzymes: No results for input(s): CKTOTAL, CKMB, CKMBINDEX, TROPONINI in the last 168 hours. CBG: No results for input(s): GLUCAP in the last 168 hours. Iron Studies: No results for input(s): IRON, TIBC, TRANSFERRIN, FERRITIN in the last 72 hours. @lablastinr3 @ Studies/Results: CT ABDOMEN PELVIS WO CONTRAST  Result Date: 05/11/2020 CLINICAL DATA:  Acute renal failure. Nausea and vomiting. Diarrhea for 2 days. Dysuria. EXAM: CT ABDOMEN AND PELVIS WITHOUT CONTRAST TECHNIQUE: Multidetector CT imaging of the abdomen and pelvis was performed following the standard protocol without IV contrast. COMPARISON:  None. FINDINGS: Lower chest: 5 mm  pulmonary lymph node in the right lower lobe, series 4, image 2 is likely a perifissural intrapulmonary lymph node but nonspecific. Heart is normal in size. No pleural effusion. Hepatobiliary: No focal liver abnormality is seen. No gallstones, gallbladder wall thickening, or biliary dilatation. Pancreas: No ductal dilatation or inflammation. Single calcification in the uncinate process of the pancreas. Spleen: Normal in size without focal abnormality. Adrenals/Urinary Tract: Bilateral adrenal thickening without dominant nodule. There is bilateral hydroureteronephrosis. Both ureters are dilated to bladder insertion. Bilateral perinephric edema that is symmetric. There is thinning of the bilateral renal parenchyma. Cortical cysts in the left kidney. There is marked urinary bladder distention with bladder spanning 23 cm cranial caudal extending into the right aspect of the abdomen. Bladder wall appears trabeculated inferiorly with probable multiple small diverticula. No definite bladder wall thickening or obvious focal bladder lesion. There is mass effect  on the bladder base from prostate gland. Stomach/Bowel: Fluid levels in the stomach without evidence of gastric wall thickening. No abnormal gastric distension. Short of the small bowel is decompressed without obstruction, inflammation, or abnormal distention. Colonic diverticulosis throughout the entire colon, including the cecum. No diverticulitis. No colonic wall thickening or inflammatory change. Normal appendix. Vascular/Lymphatic: Aortic atherosclerosis. No aortic aneurysm. No bulky abdominopelvic adenopathy. Reproductive: Enlarged prostate gland spanning 5.5 x 5.3 x 5.4 cm (volume = 82 cm^3). Other: Fat in both inguinal canals. Tiny fat containing umbilical hernia. No ascites or free air. Musculoskeletal: Degenerative change throughout the spine. There are no acute or suspicious osseous abnormalities. IMPRESSION: 1. Marked urinary bladder distention with bladder wall trabeculation and probable multiple small diverticula. Bilateral hydroureteronephrosis, likely due to distended bladder. Suspect chronic bladder outlet obstruction. 2. Enlarged prostate gland causing mass effect on the bladder base. 3. Colonic diverticulosis without diverticulitis. 4. A 5 mm pulmonary lymph node in the right lower lobe is likely a perifissural intrapulmonary lymph node but nonspecific. Consider follow-up chest CT 1 year if patient is high-risk. Aortic Atherosclerosis (ICD10-I70.0). Electronically Signed   By: Keith Rake M.D.   On: 05/11/2020 17:59   Medications:  lactated ringers with kcl 100 mL/hr at 05/12/20 0025   lactated ringers      calcium carbonate  800 mg of elemental calcium Oral BID   docusate sodium  100 mg Oral BID    Assessment 1. AKI from obstructive uropathy, uremia present without initial improvement with placement of foley 2. 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 Hypokalemia, likely due to gastrointestinal losses;  resolved with supplement 4. Hypocalcemia from severity of renal failure 5. Hypomagnesemia from gastrointestinal losses 6. Hyperphosphatemia secondary to #1 7. Metabolic acidosis and alkalosis, from renal failure and gatric losses + PO NaHCO3 intake 8. Depression 9. Normocytic anemia  Plan 1. Pending transfer to [facility] for dialysis.  Will need line placed, IR consult placed for [facility] TDC placement.   2. Continue MIVF today with LR, at 100 mL/h given NPO 3. ^d tums to TID 4. Check PTH 5. Trend phos - may need binder; will place on renal diet once eating 6. PSA pending - will need urologic evaluation regardless of result 7. Check iron indices.  No ESA at this time. 8. Daily weights, Daily Renal Panel, Strict I/Os, Avoid nephrotoxins (NSAIDs, judicious IV Contrast)  Jannifer Hick MD 05/12/2020, 11:27 AM  [facility] Pager: [phone]

## 2020-05-12 NOTE — Progress Notes (Signed)
PT Cancellation Note  Patient Details Name: Manuel Ortega MRN: [id number] DOB: [birth year]   Cancelled Treatment:    Reason Eval/Treat Not Completed: Patient not medically ready, noted that patient to transfer to [facility] for possible HD. Will initiate PT when medically ready.   Claretha Cooper 05/12/2020, 8:08 AM [place] Pager [phone] Office [phone]

## 2020-05-12 NOTE — Progress Notes (Signed)
Pharmacy Antibiotic Note  Manuel Ortega is a 69 y.o. male admitted on 05/11/2020 with renal failure. Plan to transfer to [facility] for hemodialysis. Pharmacy has been consulted for Aztreonam dosing due to concern for UTI.  Plan: Aztreonam 1g IV x 1, then 500mg  IV q12h   Height: 6\' 1"  (185.4 cm) Weight: 86.5 kg (190 lb 11.2 oz) IBW/kg (Calculated) : 79.9  Temp (24hrs), Avg:98.6 F (37 C), Min:98.2 F (36.8 C), Max:99 F (37.2 C)  Recent Labs  Lab 05/11/20 1459 05/12/20 0035 05/12/20 0502 05/12/20 1042  WBC 13.6*  --  16.2*  --   CREATININE 18.64*  18.17* 17.60* 17.02* 16.69*    Estimated Creatinine Clearance: 4.8 mL/min (A) (by C-G formula based on SCr of 16.69 mg/dL (H)).    Allergies  Allergen Reactions  . Penicillins     Antimicrobials this admission: 7/31 Aztreonam >>  Dose adjustments this admission: --  Microbiology results: 7/30 UCx: sent 7/30 COVID: negative   Thank you for allowing pharmacy to be a part of this patient's care.   Lindell Spar, PharmD, BCPS Clinical Pharmacist  05/12/2020 4:01 PM

## 2020-05-12 NOTE — Consult Note (Signed)
Chief Complaint: Acute on chronic renal failure. Request is for HD catheter placement for dialysis access..   Referring Physician(s): Dr. Leanora Cover  Supervising Physician: Sandi Mariscal  Patient Status: [facility] - ED. Bed boarded for [place] [facility]  History of Present Illness: Manuel Ortega is a 69 y.o. male  History of HTN and depression. Presented to the [facility] ED with abdominal pain nausea and vomiting. X 2 days and dysuria.  Team is requesting HD catheter placement for dialysis access..   Past Medical History:  Diagnosis Date  . Depression   . Glaucoma   . Hypertension     History reviewed. No pertinent surgical history.  Allergies: Penicillins  Medications: Prior to Admission medications   Medication Sig Start Date End Date Taking? Authorizing Provider  divalproex (DEPAKOTE) 125 MG DR tablet Take 125 mg by mouth 2 (two) times daily. 12/19/19  Yes [provider]  latanoprost (XALATAN) 0.005 % ophthalmic solution Place 1 drop into both eyes at bedtime.  12/19/19  Yes [provider]  QUEtiapine (SEROQUEL) 200 MG tablet Take 200 mg by mouth at bedtime.  11/14/19  Yes [provider]     Family History  Family history unknown: Yes    Social History   Socioeconomic History  . Marital status: Unknown    Spouse name: Not on file  . Number of children: Not on file  . Years of education: Not on file  . Highest education level: Not on file  Occupational History  . Not on file  Tobacco Use  . Smoking status: Never Smoker  . Smokeless tobacco: Never Used  Vaping Use  . Vaping Use: Never used  Substance and Sexual Activity  . Alcohol use: Never  . Drug use: Never  . Sexual activity: Not on file  Other Topics Concern  . Not on file  Social History Narrative  . Not on file   Social Determinants of Health   Financial Resource Strain:   . Difficulty of Paying Living Expenses:   Food Insecurity:   . Worried About Charity fundraiser in the Last Year:   .  Arboriculturist in the Last Year:   Transportation Needs:   . Film/video editor (Medical):   Marland Kitchen Lack of Transportation (Non-Medical):   Physical Activity:   . Days of Exercise per Week:   . Minutes of Exercise per Session:   Stress:   . Feeling of Stress :   Social Connections:   . Frequency of Communication with Friends and Family:   . Frequency of Social Gatherings with Friends and Family:   . Attends Religious Services:   . Active Member of Clubs or Organizations:   . Attends Archivist Meetings:   Marland Kitchen Marital Status:      Review of Systems: A 12 point ROS discussed and pertinent positives are indicated in the HPI above.  All other systems are negative.  Review of Systems  Constitutional: Positive for chills. Negative for fever.  HENT: Negative for congestion.   Respiratory: Negative for cough and shortness of breath.   Cardiovascular: Negative for chest pain.  Gastrointestinal: Negative for abdominal pain.  Neurological: Negative for headaches.  Psychiatric/Behavioral: Negative for behavioral problems and confusion.    Vital Signs: BP (!) 135/64 (BP Location: Left Arm)   Pulse 83   Temp 98.2 F (36.8 C) (Oral)   Resp 17   Ht 6\' 1"  (1.854 m)   SpO2 100%   Physical Exam Vitals and  nursing note reviewed.  Constitutional:      Appearance: He is well-developed.  HENT:     Head: Normocephalic.  Cardiovascular:     Rate and Rhythm: Normal rate and regular rhythm.  Pulmonary:     Effort: Pulmonary effort is normal.     Breath sounds: Normal breath sounds.  Musculoskeletal:        General: Normal range of motion.     Cervical back: Normal range of motion.  Skin:    General: Skin is dry.  Neurological:     Mental Status: He is alert and oriented to person, place, and time.     Imaging: CT ABDOMEN PELVIS WO CONTRAST  Result Date: 05/11/2020 CLINICAL DATA:  Acute renal failure. Nausea and vomiting. Diarrhea for 2 days. Dysuria. EXAM: CT ABDOMEN AND  PELVIS WITHOUT CONTRAST TECHNIQUE: Multidetector CT imaging of the abdomen and pelvis was performed following the standard protocol without IV contrast. COMPARISON:  None. FINDINGS: Lower chest: 5 mm pulmonary lymph node in the right lower lobe, series 4, image 2 is likely a perifissural intrapulmonary lymph node but nonspecific. Heart is normal in size. No pleural effusion. Hepatobiliary: No focal liver abnormality is seen. No gallstones, gallbladder wall thickening, or biliary dilatation. Pancreas: No ductal dilatation or inflammation. Single calcification in the uncinate process of the pancreas. Spleen: Normal in size without focal abnormality. Adrenals/Urinary Tract: Bilateral adrenal thickening without dominant nodule. There is bilateral hydroureteronephrosis. Both ureters are dilated to bladder insertion. Bilateral perinephric edema that is symmetric. There is thinning of the bilateral renal parenchyma. Cortical cysts in the left kidney. There is marked urinary bladder distention with bladder spanning 23 cm cranial caudal extending into the right aspect of the abdomen. Bladder wall appears trabeculated inferiorly with probable multiple small diverticula. No definite bladder wall thickening or obvious focal bladder lesion. There is mass effect on the bladder base from prostate gland. Stomach/Bowel: Fluid levels in the stomach without evidence of gastric wall thickening. No abnormal gastric distension. Short of the small bowel is decompressed without obstruction, inflammation, or abnormal distention. Colonic diverticulosis throughout the entire colon, including the cecum. No diverticulitis. No colonic wall thickening or inflammatory change. Normal appendix. Vascular/Lymphatic: Aortic atherosclerosis. No aortic aneurysm. No bulky abdominopelvic adenopathy. Reproductive: Enlarged prostate gland spanning 5.5 x 5.3 x 5.4 cm (volume = 82 cm^3). Other: Fat in both inguinal canals. Tiny fat containing umbilical hernia.  No ascites or free air. Musculoskeletal: Degenerative change throughout the spine. There are no acute or suspicious osseous abnormalities. IMPRESSION: 1. Marked urinary bladder distention with bladder wall trabeculation and probable multiple small diverticula. Bilateral hydroureteronephrosis, likely due to distended bladder. Suspect chronic bladder outlet obstruction. 2. Enlarged prostate gland causing mass effect on the bladder base. 3. Colonic diverticulosis without diverticulitis. 4. A 5 mm pulmonary lymph node in the right lower lobe is likely a perifissural intrapulmonary lymph node but nonspecific. Consider follow-up chest CT 1 year if patient is high-risk. Aortic Atherosclerosis (ICD10-I70.0). Electronically Signed   By: Keith Rake M.D.   On: 05/11/2020 17:59    Labs:  CBC: Recent Labs    05/11/20 1459 05/12/20 0502  WBC 13.6* 16.2*  HGB 9.8* 9.5*  HCT 29.5* 28.4*  PLT 428* 354    COAGS: Recent Labs    05/12/20 0502  INR 1.1  APTT 28    BMP: Recent Labs    05/11/20 1459 05/12/20 0035 05/12/20 0502 05/12/20 1042  NA 139  137 138 136 137  K 2.7*  2.6*  3.8 3.6 3.7  CL 88*  85* 91* 91* 93*  CO2 22  23 22 23 23   GLUCOSE 138*  140* 121* 119* 107*  BUN 96*  127* 102* 131* 126*  CALCIUM 6.4*  6.4* 6.9* 7.1* 6.7*  CREATININE 18.64*  18.17* 17.60* 17.02* 16.69*  GFRNONAA 2*  2* 2* 3* 3*  GFRAA 3*  3* 3* 3* 3*    LIVER FUNCTION TESTS: Recent Labs    05/11/20 1459 05/12/20 0035 05/12/20 0502 05/12/20 1042  BILITOT 0.6  --  0.6  --   AST 12*  --  11*  --   ALT 9  --  6  --   ALKPHOS 77  --  68  --   PROT 7.6  --  6.1*  --   ALBUMIN 4.3  4.1 3.4* 3.4* 3.5    Assessment and Plan: 69 y.o, male inpatient (Bed boarded at [facility] ED. Te be transferred to [facility]) . History of HTN and depression. Presented to the [facility] ED with abdominal pain nausea and vomiting. X 2 days and dysuria.  Team is requesting HD catheter placement for dialysis access..  Patient is  allergic to PCN. WBC is 16.2. Cr 16.69, BUN 126. PSA elevated.  No blood cultures ordered. All other labs and medications are within acceptable parameters. Patient is afebrile but reports chills.  IR consulted for possible HD catheter placement. Case has been reviewed and procedure approved by Dr. Pascal Lux. Temp versus Tunneled catheter to be determined.   Patient tentatively scheduled for 7.31.21.  Team instructed to: Keep Patient to be NPO after midnight Hold prophylactic anticoagulation 24 hours prior to scheduled procedure. IF started prior to IR procedure.   IR will call patient when ready.  Risks and benefits discussed with the patient including, but not limited to bleeding, infection, vascular injury, pneumothorax which may require chest tube placement, air embolism or even death  All of the patient's questions were answered, patient is agreeable to proceed.  Consent signed and in IR control room     Thank you for this interesting consult.  I greatly enjoyed meeting Carney Saxton and look forward to participating in their care.  A copy of this report was sent to the requesting provider on this date.  Electronically Signed: Jacqualine Mau, NP 05/12/2020, 3:48 PM   I spent a total of 40 Minutes    in face to face in clinical consultation, greater than 50% of which was counseling/coordinating care for HD catheter placement

## 2020-05-12 NOTE — Procedures (Signed)
Pre-procedure Diagnosis: Obstructive uropathy Post-procedure Diagnosis: Same  Successful placement of a non-tunneled HD catheter with tips terminating within the superior aspect of the right atrium.    Complications: None Immediate  EBL: Minimal   The catheter is ready for immediate use.   PLAN: PLAN:  - This catheter may be converted to a tunneled dialysis catheter at a later date as indicated.   - Note, given obstructive uropathy seen on preceding noncontrast abdominal CT performed 05/11/2020 would have low threshold for repeat noncontrast abdominal CT in the coming days to ensure adequate urinary diversion following Foley catheter insertion.  Otherwise, would give consideration for urologic referral for evaluation for potential bilateral double-J ureteral stent placement and ultimately if not a ureteral stent candidate, for bilateral percutaneous nephrostomy catheter placement.   Ronny Bacon, MD Pager #: [phone]

## 2020-05-12 NOTE — ED Notes (Signed)
Called to [facility] to give report, receiving Nurse Claiborne Billings unavailable at this time. Told to call back.

## 2020-05-13 DIAGNOSIS — R112 Nausea with vomiting, unspecified: Secondary | ICD-10-CM

## 2020-05-13 DIAGNOSIS — E876 Hypokalemia: Secondary | ICD-10-CM

## 2020-05-13 DIAGNOSIS — N179 Acute kidney failure, unspecified: Secondary | ICD-10-CM

## 2020-05-13 HISTORY — DX: Acute kidney failure, unspecified: N17.9

## 2020-05-13 LAB — HEPATITIS B CORE ANTIBODY, TOTAL: Hep B Core Total Ab: NONREACTIVE

## 2020-05-13 LAB — COMPREHENSIVE METABOLIC PANEL
ALT: 6 U/L (ref 0–44)
AST: 10 U/L — ABNORMAL LOW (ref 15–41)
Albumin: 3 g/dL — ABNORMAL LOW (ref 3.5–5.0)
Alkaline Phosphatase: 62 U/L (ref 38–126)
Anion gap: 17 — ABNORMAL HIGH (ref 5–15)
BUN: 114 mg/dL — ABNORMAL HIGH (ref 8–23)
CO2: 23 mmol/L (ref 22–32)
Calcium: 6.9 mg/dL — ABNORMAL LOW (ref 8.9–10.3)
Chloride: 98 mmol/L (ref 98–111)
Creatinine, Ser: 15.6 mg/dL — ABNORMAL HIGH (ref 0.61–1.24)
GFR calc Af Amer: 3 mL/min — ABNORMAL LOW (ref >60)
GFR calc non Af Amer: 3 mL/min — ABNORMAL LOW (ref >60)
Glucose, Bld: 104 mg/dL — ABNORMAL HIGH (ref 70–99)
Potassium: 3.7 mmol/L (ref 3.5–5.1)
Sodium: 138 mmol/L (ref 135–145)
Total Bilirubin: 1 mg/dL (ref 0.3–1.2)
Total Protein: 5.9 g/dL — ABNORMAL LOW (ref 6.5–8.1)

## 2020-05-13 LAB — HEPATITIS B SURFACE ANTIGEN: Hepatitis B Surface Ag: NONREACTIVE

## 2020-05-13 LAB — IRON AND TIBC
Iron: 44 ug/dL — ABNORMAL LOW (ref 45–182)
Saturation Ratios: 26 % (ref 17.9–39.5)
TIBC: 169 ug/dL — ABNORMAL LOW (ref 250–450)
UIBC: 125 ug/dL

## 2020-05-13 LAB — RENAL FUNCTION PANEL
Albumin: 2.9 g/dL — ABNORMAL LOW (ref 3.5–5.0)
Anion gap: 13 (ref 5–15)
BUN: 75 mg/dL — ABNORMAL HIGH (ref 8–23)
CO2: 23 mmol/L (ref 22–32)
Calcium: 7.2 mg/dL — ABNORMAL LOW (ref 8.9–10.3)
Chloride: 100 mmol/L (ref 98–111)
Creatinine, Ser: 10.85 mg/dL — ABNORMAL HIGH (ref 0.61–1.24)
GFR calc Af Amer: 5 mL/min — ABNORMAL LOW (ref >60)
GFR calc non Af Amer: 4 mL/min — ABNORMAL LOW (ref >60)
Glucose, Bld: 121 mg/dL — ABNORMAL HIGH (ref 70–99)
Phosphorus: 4.2 mg/dL (ref 2.5–4.6)
Potassium: 3.8 mmol/L (ref 3.5–5.1)
Sodium: 136 mmol/L (ref 135–145)

## 2020-05-13 LAB — URINE CULTURE: Culture: NO GROWTH

## 2020-05-13 LAB — FERRITIN: Ferritin: 235 ng/mL (ref 24–336)

## 2020-05-13 LAB — HEPATITIS B SURFACE ANTIBODY,QUALITATIVE: Hep B S Ab: NONREACTIVE

## 2020-05-13 LAB — CALCIUM, IONIZED: Calcium, Ionized, Serum: 3.4 mg/dL — ABNORMAL LOW (ref 4.5–5.6)

## 2020-05-13 MED ORDER — HEPARIN SODIUM (PORCINE) 1000 UNIT/ML IJ SOLN
INTRAMUSCULAR | Status: AC
  Filled 2020-05-13: qty 3

## 2020-05-13 NOTE — Progress Notes (Signed)
[facility] Progress Note   Subjective/Interval Events:     Made it to [facility]  Rec Temp HD Cath, no HD yet  Still with fatigue, malaise, hiccups, anorexia  SCr very slowly improved, BUN as well, K 3.7, Ca 6.9.  HCO3 23 with AG of 17  2.2L UOP  Objective Vitals:   05/13/20 1230 05/13/20 1300 05/13/20 1330 05/13/20 1400  BP: (!) 140/100 (!) 140/90 (!) 135/90 (!) 135/84  Pulse: 96 90 92 92  Resp:      Temp:      TempSrc:      SpO2:      Weight:      Height:       Physical Exam General: nad Heart: RRR, no rub Lungs: clear Abdomen: soft, NABS Extremities: no edema GU: foley draining blood tinged urine without clots Neuro: tremulous but no asterixis, some difficulty with quick recall of details but pretty sharp  Additional Objective Labs: Basic Metabolic Panel: Recent Labs  Lab 05/11/20 1459 05/11/20 1459 05/12/20 0035 05/12/20 0035 05/12/20 0502 05/12/20 1042 05/13/20 0730  NA 139  137   < > 138   < > 136 137 138  K 2.7*  2.6*   < > 3.8   < > 3.6 3.7 3.7  CL 88*  85*   < > 91*   < > 91* 93* 98  CO2 22  23   < > 22   < > _0 GLUCOSE 138*  140*   < > 121*   < > 119* 107* 104*  BUN 96*  127*   < > 102*   < > 131* 126* 114*  CREATININE 18.64*  18.17*   < > 17.60*   < > 17.02* 16.69* 15.60*  CALCIUM 6.4*  6.4*   < > 6.9*   < > 7.1* 6.7* 6.9*  PHOS 5.4*  5.2*  --  6.7*  --   --  6.5*  --    < > = values in this interval not displayed.   Liver Function Tests: Recent Labs  Lab 05/11/20 1459 05/12/20 0035 05/12/20 0502 05/12/20 1042 05/13/20 0730  AST 12*  --  11*  --  10*  ALT 9  --  6  --  6  ALKPHOS 77  --  68  --  62  BILITOT 0.6  --  0.6  --  1.0  PROT 7.6  --  6.1*  --  5.9*  ALBUMIN 4.3  4.1   < > 3.4* 3.5 3.0*   < > = values in this interval not displayed.   Recent Labs  Lab 05/11/20 1459  LIPASE 190*   CBC: Recent Labs  Lab 05/11/20 1459 05/12/20 0502  WBC 13.6* 16.2*  HGB 9.8* 9.5*  HCT 29.5* 28.4*  MCV  81.5 82.8  PLT 428* 354   Blood Culture    Component Value Date/Time   SDES  05/11/2020 1805    URINE, CLEAN CATCH Performed at [facility], [place] [address] [place], [place] [address]    SPECREQUEST  05/11/2020 1805    NONE Performed at [facility], [place] [address] [place], [place] [address]    CULT  05/11/2020 1805    NO GROWTH Performed at [facility] [address] [place], [place] [address]    REPTSTATUS 05/13/2020 FINAL 05/11/2020 1805    Cardiac Enzymes: No results for input(s): CKTOTAL, CKMB, CKMBINDEX, TROPONINI in the last 168  hours. CBG: No results for input(s): GLUCAP in the last 168 hours. Iron Studies:  Recent Labs    05/13/20 1318  IRON 44*  TIBC 169*  FERRITIN 235   _0 @ Studies/Results: CT ABDOMEN PELVIS WO CONTRAST  Result Date: 05/11/2020 CLINICAL DATA:  Acute renal failure. Nausea and vomiting. Diarrhea for 2 days. Dysuria. EXAM: CT ABDOMEN AND PELVIS WITHOUT CONTRAST TECHNIQUE: Multidetector CT imaging of the abdomen and pelvis was performed following the standard protocol without IV contrast. COMPARISON:  None. FINDINGS: Lower chest: 5 mm pulmonary lymph node in the right lower lobe, series 4, image 2 is likely a perifissural intrapulmonary lymph node but nonspecific. Heart is normal in size. No pleural effusion. Hepatobiliary: No focal liver abnormality is seen. No gallstones, gallbladder wall thickening, or biliary dilatation. Pancreas: No ductal dilatation or inflammation. Single calcification in the uncinate process of the pancreas. Spleen: Normal in size without focal abnormality. Adrenals/Urinary Tract: Bilateral adrenal thickening without dominant nodule. There is bilateral hydroureteronephrosis. Both ureters are dilated to bladder insertion. Bilateral perinephric edema that is symmetric. There is thinning of the bilateral renal parenchyma. Cortical cysts in the left kidney. There is marked  urinary bladder distention with bladder spanning 23 cm cranial caudal extending into the right aspect of the abdomen. Bladder wall appears trabeculated inferiorly with probable multiple small diverticula. No definite bladder wall thickening or obvious focal bladder lesion. There is mass effect on the bladder base from prostate gland. Stomach/Bowel: Fluid levels in the stomach without evidence of gastric wall thickening. No abnormal gastric distension. Short of the small bowel is decompressed without obstruction, inflammation, or abnormal distention. Colonic diverticulosis throughout the entire colon, including the cecum. No diverticulitis. No colonic wall thickening or inflammatory change. Normal appendix. Vascular/Lymphatic: Aortic atherosclerosis. No aortic aneurysm. No bulky abdominopelvic adenopathy. Reproductive: Enlarged prostate gland spanning 5.5 x 5.3 x 5.4 cm (volume = 82 cm^3). Other: Fat in both inguinal canals. Tiny fat containing umbilical hernia. No ascites or free air. Musculoskeletal: Degenerative change throughout the spine. There are no acute or suspicious osseous abnormalities. IMPRESSION: 1. Marked urinary bladder distention with bladder wall trabeculation and probable multiple small diverticula. Bilateral hydroureteronephrosis, likely due to distended bladder. Suspect chronic bladder outlet obstruction. 2. Enlarged prostate gland causing mass effect on the bladder base. 3. Colonic diverticulosis without diverticulitis. 4. A 5 mm pulmonary lymph node in the right lower lobe is likely a perifissural intrapulmonary lymph node but nonspecific. Consider follow-up chest CT 1 year if patient is high-risk. Aortic Atherosclerosis (ICD10-I70.0). Electronically Signed   By: Keith Rake M.D.   On: 05/11/2020 17:59   IR Fluoro Guide CV Line Right  Result Date: 05/12/2020 INDICATION: Obstructive uropathy secondary to BPH now with acute on chronic renal insufficiency, hopes to be improved with Foley  catheter insertion and initiation of dialysis. Patient also with elevated white blood cell count. As such, request made for placement of a temporary dialysis catheter for the initiation of dialysis. EXAM: NON-TUNNELED CENTRAL VENOUS HEMODIALYSIS CATHETER PLACEMENT WITH ULTRASOUND AND FLUOROSCOPIC GUIDANCE COMPARISON:  None. Anesthesia/sedation: Moderate (conscious) sedation was employed during this procedure. A total of Fentanyl 100 mcg was administered intravenously. Moderate Sedation Time: 10 minutes. The patient's level of consciousness and vital signs were monitored continuously by radiology nursing throughout the procedure under my direct supervision. MEDICATIONS: None FLUOROSCOPY TIME:  24 seconds (7 mGy) COMPLICATIONS: None immediate. PROCEDURE: Informed written consent was obtained from the patient after a discussion of the risks, benefits, and alternatives to treatment. Questions regarding the  procedure were encouraged and answered. The right neck and chest were prepped with chlorhexidine in a sterile fashion, and a sterile drape was applied covering the operative field. Maximum barrier sterile technique with sterile gowns and gloves were used for the procedure. A timeout was performed prior to the initiation of the procedure. After the overlying soft tissues were anesthetized, a small venotomy incision was created and a micropuncture kit was utilized to access the internal jugular vein. Real-time ultrasound guidance was utilized for vascular access including the acquisition of a permanent ultrasound image documenting patency of the accessed vessel. The microwire was utilized to measure appropriate catheter length. A stiff glidewire was advanced to the level of the IVC. Under fluoroscopic guidance, the venotomy was serially dilated, ultimately allowing placement of a 20 cm temporary Trialysis catheter with tip ultimately terminating within the superior aspect of the right atrium. Final catheter positioning  was confirmed and documented with a spot radiographic image. The catheter aspirates and flushes normally. The catheter was flushed with appropriate volume heparin dwells. The catheter exit site was secured with a 0-Prolene retention suture. A dressing was placed. The patient tolerated the procedure well without immediate post procedural complication. IMPRESSION: Successful placement of a right internal jugular approach 20 cm temporary dialysis catheter with tip terminating with in the superior aspect of the right atrium. The catheter is ready for immediate use. PLAN: This catheter may be converted to a tunneled dialysis catheter at a later date as indicated. Note, given obstructive uropathy seen on preceding noncontrast abdominal CT performed 05/11/2020 would have low threshold for repeat noncontrast abdominal CT in the coming days to ensure adequate urinary diversion following Foley catheter insertion. Otherwise, would give consideration for urologic referral for evaluation for potential bilateral double-J ureteral stent placement and ultimately if not a ureteral stent candidate, for bilateral percutaneous nephrostomy catheter placement. Electronically Signed   By: Sandi Mariscal M.D.   On: 05/12/2020 17:29   IR US Guide Vasc Access Right  Result Date: 05/12/2020 INDICATION: Obstructive uropathy secondary to BPH now with acute on chronic renal insufficiency, hopes to be improved with Foley catheter insertion and initiation of dialysis. Patient also with elevated white blood cell count. As such, request made for placement of a temporary dialysis catheter for the initiation of dialysis. EXAM: NON-TUNNELED CENTRAL VENOUS HEMODIALYSIS CATHETER PLACEMENT WITH ULTRASOUND AND FLUOROSCOPIC GUIDANCE COMPARISON:  None. Anesthesia/sedation: Moderate (conscious) sedation was employed during this procedure. A total of Fentanyl 100 mcg was administered intravenously. Moderate Sedation Time: 10 minutes. The patient's level of  consciousness and vital signs were monitored continuously by radiology nursing throughout the procedure under my direct supervision. MEDICATIONS: None FLUOROSCOPY TIME:  24 seconds (7 mGy) COMPLICATIONS: None immediate. PROCEDURE: Informed written consent was obtained from the patient after a discussion of the risks, benefits, and alternatives to treatment. Questions regarding the procedure were encouraged and answered. The right neck and chest were prepped with chlorhexidine in a sterile fashion, and a sterile drape was applied covering the operative field. Maximum barrier sterile technique with sterile gowns and gloves were used for the procedure. A timeout was performed prior to the initiation of the procedure. After the overlying soft tissues were anesthetized, a small venotomy incision was created and a micropuncture kit was utilized to access the internal jugular vein. Real-time ultrasound guidance was utilized for vascular access including the acquisition of a permanent ultrasound image documenting patency of the accessed vessel. The microwire was utilized to measure appropriate catheter length. A stiff glidewire  was advanced to the level of the IVC. Under fluoroscopic guidance, the venotomy was serially dilated, ultimately allowing placement of a 20 cm temporary Trialysis catheter with tip ultimately terminating within the superior aspect of the right atrium. Final catheter positioning was confirmed and documented with a spot radiographic image. The catheter aspirates and flushes normally. The catheter was flushed with appropriate volume heparin dwells. The catheter exit site was secured with a 0-Prolene retention suture. A dressing was placed. The patient tolerated the procedure well without immediate post procedural complication. IMPRESSION: Successful placement of a right internal jugular approach 20 cm temporary dialysis catheter with tip terminating with in the superior aspect of the right atrium. The  catheter is ready for immediate use. PLAN: This catheter may be converted to a tunneled dialysis catheter at a later date as indicated. Note, given obstructive uropathy seen on preceding noncontrast abdominal CT performed 05/11/2020 would have low threshold for repeat noncontrast abdominal CT in the coming days to ensure adequate urinary diversion following Foley catheter insertion. Otherwise, would give consideration for urologic referral for evaluation for potential bilateral double-J ureteral stent placement and ultimately if not a ureteral stent candidate, for bilateral percutaneous nephrostomy catheter placement. Electronically Signed   By: Sandi Mariscal M.D.   On: 05/12/2020 17:29   Medications: . sodium chloride    . sodium chloride    . aztreonam Stopped (05/13/20 6578)  . lactated ringers with kcl 100 mL/hr at 05/13/20 1100  . lactated ringers     . calcium carbonate  800 mg of elemental calcium Oral TID  . Chlorhexidine Gluconate Cloth  6 each Topical Q0600  . docusate sodium  100 mg Oral BID  . heparin sodium (porcine)       Results for BRENDA, COWHER (MRN [id number]) as of 05/13/2020 14:16  Ref. Range 05/12/2020 00:30  Prostatic Specific Antigen Latest Ref Range: 0.00 - 4.00 ng/mL 28.60 (H)    Assessment 1. AKI from obstructive uropathy, uremia present without significant initial improvement with placement of foley 2. 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 Elevated PSA from #2, probably, will need uro f/u 4. Hypokalemia, likely due to gastrointestinal losses; resolved with supplement 5. Hypocalcemia from severity of renal failure, improving 6. Hypomagnesemia from gastrointestinal losses, repleted 7. Hyperphosphatemia secondary to #1 8. Metabolic acidosis and alkalosis, from renal failure and gatric losses + PO NaHCO3 intake; AG down 9. Depression 10. Normocytic anemia, TSAT 26%  Plan 1. HD today 2/2 uremic  Sx, might recover GFR over the next few days to come off HD 2. For another dayc ontinue MIVF today with LR, at 100 mL/h 3. Daily weights, Daily Renal Panel, Strict I/Os, Avoid nephrotoxins (NSAIDs, judicious IV Contrast)  Rexene Agent  05/13/2020, 2:16 PM  [facility]

## 2020-05-13 NOTE — Evaluation (Signed)
Physical Therapy Evaluation Patient Details Name: Manuel Ortega MRN: [id number] DOB: [birth year] Today's Date: 05/13/2020   History of Present Illness  69 y.o. male with medical history significant of major depression and hypertension,  not taking any medication,  has no primary care physician, he has no medical care in the last 5 years presenting in the [facility] emergency department with nausea and vomiting for past 4 days.  He also report having decreased appetite,  intermittent cough but denies any weight loss.  He denies any history of kidney disease.  Clinical Impression  Pt presents to PT with deficits in functional mobility, gait, balance, endurance, strength, power, and cognition. Pt mobilizes with minG during session for safety reasons, no significant LOB or needs for assist to prevent fall. Pt with syncopal-like episode on commode as documented below in general comments. Pt does mention his history of depression multiple times during session, denies SI, but also reports he has no assistance. Pt may benefit from social work consult to discuss his depression. Pt will also benefit from continued acute PT POC to improve mobility and to reduce falls risk. PT recommends HHPT at this time in anticipation that the pt will make quick progress with mobility.    Follow Up Recommendations Home health PT    Equipment Recommendations  3in1 (PT) (further needs TBD)    Recommendations for Other Services Other (comment) (social work consult (mentions his depression multiple times))     Precautions / Restrictions Precautions Precautions: Fall Restrictions Weight Bearing Restrictions: No      Mobility  Bed Mobility Overal bed mobility: Needs Assistance Bed Mobility: Supine to Sit     Supine to sit: Supervision        Transfers Overall transfer level: Needs assistance Equipment used: None Transfers: Sit to/from Stand Sit to Stand: Min guard            Ambulation/Gait Ambulation/Gait  assistance: Min guard Gait Distance (Feet): 20 Feet Assistive device: None Gait Pattern/deviations: Step-to pattern Gait velocity: reduced Gait velocity interpretation: <1.8 ft/sec, indicate of risk for recurrent falls General Gait Details: pt with short step to gait, no significant balance deviations noted  Stairs            Wheelchair Mobility    Modified Rankin (Stroke Patients Only)       Balance Overall balance assessment: Needs assistance Sitting-balance support: No upper extremity supported;Feet supported Sitting balance-Leahy Scale: Fair     Standing balance support: No upper extremity supported Standing balance-Leahy Scale: Fair                               Pertinent Vitals/Pain Pain Assessment: No/denies pain    Home Living Family/patient expects to be discharged to:: Private residence Living Arrangements: Alone Available Help at Discharge: Family;Available PRN/intermittently (cousin who lives in [place]) Type of Home: House Home Access: Stairs to enter Entrance Stairs-Rails: None Entrance Stairs-Number of Steps: 1 Home Layout: Two level (has been sleeping on couch, no BR downstairs) Home Equipment: None      Prior Function Level of Independence: Independent         Comments: 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     [employer]        Extremity/Trunk Assessment   Upper Extremity Assessment Upper Extremity Assessment: Overall WFL for tasks assessed    Lower Extremity Assessment Lower Extremity Assessment: Generalized weakness    Cervical / Trunk Assessment  Cervical / Trunk Assessment: Normal  Communication   Communication: No difficulties  Cognition Arousal/Alertness: Awake/alert Behavior During Therapy: WFL for tasks assessed/performed Overall Cognitive Status: Impaired/Different from baseline Area of Impairment: Orientation;Memory;Following commands;Safety/judgement;Awareness;Problem  solving                 Orientation Level: Disoriented to;Place;Time (requires cues for which hospital and month)     Following Commands: Follows one step commands consistently Safety/Judgement: Decreased awareness of deficits Awareness: Emergent Problem Solving: Requires verbal cues        General Comments General comments (skin integrity, edema, etc.): pt reports some dizziness while ambulating to bathroom, PT assists pt into sitting on commode, pt has syncopal-like episode on commode with reduced responsiveness for ~10 seconds, is wide-eyed and BUE tremoring. Pt quickly becomes more alert and responsive with stimulation, back to baseline cognition. RN arrives to assist pt back to recliner where BP is found to be 131/84, no further symptoms    Exercises     Assessment/Plan    PT Assessment Patient needs continued PT services  PT Problem List Decreased strength;Decreased activity tolerance;Decreased balance;Decreased mobility;Decreased cognition;Decreased knowledge of use of DME;Decreased safety awareness;Decreased knowledge of precautions       PT Treatment Interventions DME instruction;Gait training;Stair training;Functional mobility training;Therapeutic activities;Therapeutic exercise;Balance training;Neuromuscular re-education;Patient/family education    PT Goals (Current goals can be found in the Care Plan section)  Acute Rehab PT Goals Patient Stated Goal: To improve mobility and find more assistance PT Goal Formulation: With patient Time For Goal Achievement: 05/27/20 Potential to Achieve Goals: Good    Frequency Min 3X/week   Barriers to discharge        Co-evaluation               AM-PAC PT "6 Clicks" Mobility  Outcome Measure Help needed turning from your back to your side while in a flat bed without using bedrails?: None Help needed moving from lying on your back to sitting on the side of a flat bed without using bedrails?: None Help needed  moving to and from a bed to a chair (including a wheelchair)?: A Little Help needed standing up from a chair using your arms (e.g., wheelchair or bedside chair)?: A Little Help needed to walk in hospital room?: A Little Help needed climbing 3-5 steps with a railing? : A Lot 6 Click Score: 19    End of Session   Activity Tolerance: Treatment limited secondary to medical complications (Comment) Patient left: in chair;with call bell/phone within reach Nurse Communication: Mobility status PT Visit Diagnosis: Unsteadiness on feet (R26.81);Other abnormalities of gait and mobility (R26.89);Muscle weakness (generalized) (M62.81);Other symptoms and signs involving the nervous system (R29.898)    Time: 9150-5697 PT Time Calculation (min) (ACUTE ONLY): 23 min   Charges:   PT Evaluation $PT Eval Moderate Complexity: 1 Mod PT Treatments $Therapeutic Activity: 8-22 mins        Zenaida Niece, PT, DPT Acute Rehabilitation Pager: [phone]   Zenaida Niece 05/13/2020, 9:07 AM

## 2020-05-13 NOTE — Progress Notes (Signed)
[facility] PROGRESS NOTE    Progress Note  Vishal Sandlin  AJO:[id number] DOB: 1951-07-21 DOA: 05/11/2020 PCP: System, Pcp Not In     Brief Narrative:   Manuel Ortega is an 69 y.o. male past medical history significant of depression and hypertension noncompliant with his medication with no primary care doctor for over the last 5 years presented to the [facility] long hospital with nausea vomiting that started 4 days prior to admission.  In the ED he was found to have a white count of 13 BUN of 127 creatinine of 18 potassium was 2.6 anion gap of 29 and a bicarb of 23.  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  Nephrology was consulted, as well as IR for tunneled HD catheter.  Assessment/Plan:   Acute kidney injury ([place]) agree due to obstructive uropathy/nausea and vomiting: No baseline creatinine to compare it with.  Likely obstructive uropathy due to BPH. Nausea vomiting likely due to uremic symptoms. IR was consulted and HD tunneled catheter placed on 05/12/2020. Nephrology has been consulted. Will be continued on IV fluids further management per nephrology. PSA 28.6, urine culture has been sent. Foley catheter in place and he has had about a 2 L output.  Hypokalemia/hypomagnesemia/hypocalcemia and secondary hyperphosphatemia secondary to #1: Check a PTH start Tums, continue follow electrolytes. Potassium is improved. Phosphorus persistently high, magnesium 1.2. Further management per renal.  Anemia of chronic renal disease vomiting: Iron studies have been sent, IV iron and iron as per renal.  Leukocytosis: Likely reactive.  He was started on aztreonam for possible UTI urine cultures are pending.  Elevated lipase: Likely due to nausea and vomiting he has no pancreatitis.  DVT prophylaxis: lovenox Family Communication:none Status is: Inpatient  Remains inpatient appropriate because:Hemodynamically unstable   Dispo: The patient is from:  Home              Anticipated d/c is to: Home              Anticipated d/c date is: > 3 days              Patient currently is not medically stable to d/c.        Code Status:     Code Status Orders  (From admission, onward)         Start     Ordered   05/11/20 2231  Full code  Continuous        05/11/20 2230        Code Status History    This patient has a current code status but no historical code status.   Advance Care Planning Activity        IV Access:    Peripheral IV   Procedures and diagnostic studies:   CT ABDOMEN PELVIS WO CONTRAST  Result Date: 05/11/2020 CLINICAL DATA:  Acute renal failure. Nausea and vomiting. Diarrhea for 2 days. Dysuria. EXAM: CT ABDOMEN AND PELVIS WITHOUT CONTRAST TECHNIQUE: Multidetector CT imaging of the abdomen and pelvis was performed following the standard protocol without IV contrast. COMPARISON:  None. FINDINGS: Lower chest: 5 mm pulmonary lymph node in the right lower lobe, series 4, image 2 is likely a perifissural intrapulmonary lymph node but nonspecific. Heart is normal in size. No pleural effusion. Hepatobiliary: No focal liver abnormality is seen. No gallstones, gallbladder wall thickening, or biliary dilatation. Pancreas: No ductal dilatation or inflammation. Single calcification in the uncinate process of the pancreas. Spleen: Normal in size without focal abnormality.  Adrenals/Urinary Tract: Bilateral adrenal thickening without dominant nodule. There is bilateral hydroureteronephrosis. Both ureters are dilated to bladder insertion. Bilateral perinephric edema that is symmetric. There is thinning of the bilateral renal parenchyma. Cortical cysts in the left kidney. There is marked urinary bladder distention with bladder spanning 23 cm cranial caudal extending into the right aspect of the abdomen. Bladder wall appears trabeculated inferiorly with probable multiple small diverticula. No definite bladder wall thickening or  obvious focal bladder lesion. There is mass effect on the bladder base from prostate gland. Stomach/Bowel: Fluid levels in the stomach without evidence of gastric wall thickening. No abnormal gastric distension. Short of the small bowel is decompressed without obstruction, inflammation, or abnormal distention. Colonic diverticulosis throughout the entire colon, including the cecum. No diverticulitis. No colonic wall thickening or inflammatory change. Normal appendix. Vascular/Lymphatic: Aortic atherosclerosis. No aortic aneurysm. No bulky abdominopelvic adenopathy. Reproductive: Enlarged prostate gland spanning 5.5 x 5.3 x 5.4 cm (volume = 82 cm^3). Other: Fat in both inguinal canals. Tiny fat containing umbilical hernia. No ascites or free air. Musculoskeletal: Degenerative change throughout the spine. There are no acute or suspicious osseous abnormalities. IMPRESSION: 1. Marked urinary bladder distention with bladder wall trabeculation and probable multiple small diverticula. Bilateral hydroureteronephrosis, likely due to distended bladder. Suspect chronic bladder outlet obstruction. 2. Enlarged prostate gland causing mass effect on the bladder base. 3. Colonic diverticulosis without diverticulitis. 4. A 5 mm pulmonary lymph node in the right lower lobe is likely a perifissural intrapulmonary lymph node but nonspecific. Consider follow-up chest CT 1 year if patient is high-risk. Aortic Atherosclerosis (ICD10-I70.0). Electronically Signed   By: Narda Rutherford M.D.   On: 05/11/2020 17:59   IR Fluoro Guide CV Line Right  Result Date: 05/12/2020 INDICATION: Obstructive uropathy secondary to BPH now with acute on chronic renal insufficiency, hopes to be improved with Foley catheter insertion and initiation of dialysis. Patient also with elevated white blood cell count. As such, request made for placement of a temporary dialysis catheter for the initiation of dialysis. EXAM: NON-TUNNELED CENTRAL VENOUS  HEMODIALYSIS CATHETER PLACEMENT WITH ULTRASOUND AND FLUOROSCOPIC GUIDANCE COMPARISON:  None. Anesthesia/sedation: Moderate (conscious) sedation was employed during this procedure. A total of Fentanyl 100 mcg was administered intravenously. Moderate Sedation Time: 10 minutes. The patient's level of consciousness and vital signs were monitored continuously by radiology nursing throughout the procedure under my direct supervision. MEDICATIONS: None FLUOROSCOPY TIME:  24 seconds (7 mGy) COMPLICATIONS: None immediate. PROCEDURE: Informed written consent was obtained from the patient after a discussion of the risks, benefits, and alternatives to treatment. Questions regarding the procedure were encouraged and answered. The right neck and chest were prepped with chlorhexidine in a sterile fashion, and a sterile drape was applied covering the operative field. Maximum barrier sterile technique with sterile gowns and gloves were used for the procedure. A timeout was performed prior to the initiation of the procedure. After the overlying soft tissues were anesthetized, a small venotomy incision was created and a micropuncture kit was utilized to access the internal jugular vein. Real-time ultrasound guidance was utilized for vascular access including the acquisition of a permanent ultrasound image documenting patency of the accessed vessel. The microwire was utilized to measure appropriate catheter length. A stiff glidewire was advanced to the level of the IVC. Under fluoroscopic guidance, the venotomy was serially dilated, ultimately allowing placement of a 20 cm temporary Trialysis catheter with tip ultimately terminating within the superior aspect of the right atrium. Final catheter positioning was confirmed and documented  with a spot radiographic image. The catheter aspirates and flushes normally. The catheter was flushed with appropriate volume heparin dwells. The catheter exit site was secured with a 0-Prolene retention  suture. A dressing was placed. The patient tolerated the procedure well without immediate post procedural complication. IMPRESSION: Successful placement of a right internal jugular approach 20 cm temporary dialysis catheter with tip terminating with in the superior aspect of the right atrium. The catheter is ready for immediate use. PLAN: This catheter may be converted to a tunneled dialysis catheter at a later date as indicated. Note, given obstructive uropathy seen on preceding noncontrast abdominal CT performed 05/11/2020 would have low threshold for repeat noncontrast abdominal CT in the coming days to ensure adequate urinary diversion following Foley catheter insertion. Otherwise, would give consideration for urologic referral for evaluation for potential bilateral double-J ureteral stent placement and ultimately if not a ureteral stent candidate, for bilateral percutaneous nephrostomy catheter placement. Electronically Signed   By: Sandi Mariscal M.D.   On: 05/12/2020 17:29   IR US Guide Vasc Access Right  Result Date: 05/12/2020 INDICATION: Obstructive uropathy secondary to BPH now with acute on chronic renal insufficiency, hopes to be improved with Foley catheter insertion and initiation of dialysis. Patient also with elevated white blood cell count. As such, request made for placement of a temporary dialysis catheter for the initiation of dialysis. EXAM: NON-TUNNELED CENTRAL VENOUS HEMODIALYSIS CATHETER PLACEMENT WITH ULTRASOUND AND FLUOROSCOPIC GUIDANCE COMPARISON:  None. Anesthesia/sedation: Moderate (conscious) sedation was employed during this procedure. A total of Fentanyl 100 mcg was administered intravenously. Moderate Sedation Time: 10 minutes. The patient's level of consciousness and vital signs were monitored continuously by radiology nursing throughout the procedure under my direct supervision. MEDICATIONS: None FLUOROSCOPY TIME:  24 seconds (7 mGy) COMPLICATIONS: None immediate. PROCEDURE:  Informed written consent was obtained from the patient after a discussion of the risks, benefits, and alternatives to treatment. Questions regarding the procedure were encouraged and answered. The right neck and chest were prepped with chlorhexidine in a sterile fashion, and a sterile drape was applied covering the operative field. Maximum barrier sterile technique with sterile gowns and gloves were used for the procedure. A timeout was performed prior to the initiation of the procedure. After the overlying soft tissues were anesthetized, a small venotomy incision was created and a micropuncture kit was utilized to access the internal jugular vein. Real-time ultrasound guidance was utilized for vascular access including the acquisition of a permanent ultrasound image documenting patency of the accessed vessel. The microwire was utilized to measure appropriate catheter length. A stiff glidewire was advanced to the level of the IVC. Under fluoroscopic guidance, the venotomy was serially dilated, ultimately allowing placement of a 20 cm temporary Trialysis catheter with tip ultimately terminating within the superior aspect of the right atrium. Final catheter positioning was confirmed and documented with a spot radiographic image. The catheter aspirates and flushes normally. The catheter was flushed with appropriate volume heparin dwells. The catheter exit site was secured with a 0-Prolene retention suture. A dressing was placed. The patient tolerated the procedure well without immediate post procedural complication. IMPRESSION: Successful placement of a right internal jugular approach 20 cm temporary dialysis catheter with tip terminating with in the superior aspect of the right atrium. The catheter is ready for immediate use. PLAN: This catheter may be converted to a tunneled dialysis catheter at a later date as indicated. Note, given obstructive uropathy seen on preceding noncontrast abdominal CT performed 05/11/2020  would have low  threshold for repeat noncontrast abdominal CT in the coming days to ensure adequate urinary diversion following Foley catheter insertion. Otherwise, would give consideration for urologic referral for evaluation for potential bilateral double-J ureteral stent placement and ultimately if not a ureteral stent candidate, for bilateral percutaneous nephrostomy catheter placement. Electronically Signed   By: Sandi Mariscal M.D.   On: 05/12/2020 17:29     Medical Consultants:    None.  Anti-Infectives:   none  Subjective:    Manuel Ortega he relates he still has no appetite no taste of smell.  Objective:    Vitals:   05/12/20 2146 05/13/20 0003 05/13/20 0014 05/13/20 0544  BP:  (!) 171/92  (!) 139/65  Pulse: 92 90  85  Resp: '17 20  20  '$ Temp:  98.6 F (37 C)  98.2 F (36.8 C)  TempSrc:  Oral  Oral  SpO2: 100% 100%  100%  Weight:   (!) 96.6 kg   Height:       SpO2: 100 % O2 Flow Rate (L/min): 2 L/min   Intake/Output Summary (Last 24 hours) at 05/13/2020 0720 Last data filed at 05/13/2020 0657 Gross per 24 hour  Intake 3100.52 ml  Output 2180 ml  Net 920.52 ml   Filed Weights   05/12/20 1554 05/13/20 0014  Weight: 86.5 kg (!) 96.6 kg    Exam: General exam: In no acute distress. Respiratory system: Good air movement and clear to auscultation. Cardiovascular system: S1 & S2 heard, RRR. No JVD. Gastrointestinal system: Abdomen is nondistended, soft and nontender.  Extremities: No pedal edema. Skin: No rashes, lesions or ulcers Psychiatry: Judgement and insight appear normal. Mood & affect appropriate.   Data Reviewed:    Labs: Basic Metabolic Panel: Recent Labs  Lab 05/11/20 1459 05/11/20 1459 05/12/20 0035 05/12/20 0035 05/12/20 0502 05/12/20 1042  NA 139  137  --  138  --  136 137  K 2.7*  2.6*   < > 3.8   < > 3.6 3.7  CL 88*  85*  --  91*  --  91* 93*  CO2 22  23  --  22  --  23 23  GLUCOSE 138*  140*  --  121*  --  119* 107*  BUN 96*   127*  --  102*  --  131* 126*  CREATININE 18.64*  18.17*  --  17.60*  --  17.02* 16.69*  CALCIUM 6.4*  6.4*  --  6.9*  --  7.1* 6.7*  MG 1.5*  --   --   --   --   --   PHOS 5.4*  5.2*  --  6.7*  --   --  6.5*   < > = values in this interval not displayed.   GFR Estimated Creatinine Clearance: 5.2 mL/min (A) (by C-G formula based on SCr of 16.69 mg/dL (H)). Liver Function Tests: Recent Labs  Lab 05/11/20 1459 05/12/20 0035 05/12/20 0502 05/12/20 1042  AST 12*  --  11*  --   ALT 9  --  6  --   ALKPHOS 77  --  68  --   BILITOT 0.6  --  0.6  --   PROT 7.6  --  6.1*  --   ALBUMIN 4.3  4.1 3.4* 3.4* 3.5   Recent Labs  Lab 05/11/20 1459  LIPASE 190*   No results for input(s): AMMONIA in the last 168 hours. Coagulation profile Recent Labs  Lab 05/12/20 0502  INR 1.1  COVID-19 Labs  No results for input(s): DDIMER, FERRITIN, LDH, CRP in the last 72 hours.  Lab Results  Component Value Date   [place] NEGATIVE 05/11/2020    CBC: Recent Labs  Lab 05/11/20 1459 05/12/20 0502  WBC 13.6* 16.2*  HGB 9.8* 9.5*  HCT 29.5* 28.4*  MCV 81.5 82.8  PLT 428* 354   Cardiac Enzymes: No results for input(s): CKTOTAL, CKMB, CKMBINDEX, TROPONINI in the last 168 hours. BNP (last 3 results) No results for input(s): PROBNP in the last 8760 hours. CBG: No results for input(s): GLUCAP in the last 168 hours. D-Dimer: No results for input(s): DDIMER in the last 72 hours. Hgb A1c: No results for input(s): HGBA1C in the last 72 hours. Lipid Profile: No results for input(s): CHOL, HDL, LDLCALC, TRIG, CHOLHDL, LDLDIRECT in the last 72 hours. Thyroid function studies: No results for input(s): TSH, T4TOTAL, T3FREE, THYROIDAB in the last 72 hours.  Invalid input(s): FREET3 Anemia work up: No results for input(s): VITAMINB12, FOLATE, FERRITIN, TIBC, IRON, RETICCTPCT in the last 72 hours. Sepsis Labs: Recent Labs  Lab 05/11/20 1459 05/12/20 0502  WBC 13.6* 16.2*    Microbiology Recent Results (from the past 240 hour(s))  SARS Coronavirus 2 by RT PCR (hospital order, performed in [facility] hospital lab) Nasopharyngeal Nasopharyngeal Swab     Status: None   Collection Time: 05/11/20  5:41 PM   Specimen: Nasopharyngeal Swab  Result Value Ref Range Status   SARS Coronavirus 2 NEGATIVE NEGATIVE Final    Comment: (NOTE) SARS-CoV-2 target nucleic acids are NOT DETECTED.  The SARS-CoV-2 RNA is generally detectable in upper and lower respiratory specimens during the acute phase of infection. The lowest concentration of SARS-CoV-2 viral copies this assay can detect is 250 copies / mL. A negative result does not preclude SARS-CoV-2 infection and should not be used as the sole basis for treatment or other patient management decisions.  A negative result may occur with improper specimen collection / handling, submission of specimen other than nasopharyngeal swab, presence of viral mutation(s) within the areas targeted by this assay, and inadequate number of viral copies (<250 copies / mL). A negative result must be combined with clinical observations, patient history, and epidemiological information.  Fact Sheet for Patients:   StrictlyIdeas.no  Fact Sheet for Healthcare Providers: BankingDealers.co.za  This test is not yet approved or  cleared by the [place] FDA and has been authorized for detection and/or diagnosis of SARS-CoV-2 by FDA under an Emergency Use Authorization (EUA).  This EUA will remain in effect (meaning this test can be used) for the duration of the COVID-19 declaration under Section 564(b)(1) of the Act, 21 U.S.C. section 360bbb-3(b)(1), unless the authorization is terminated or revoked sooner.  Performed at [facility], [place] [address], [place], [place] [address]      Medications:   . calcium carbonate  800 mg of elemental calcium Oral TID  .  Chlorhexidine Gluconate Cloth  6 each Topical Q0600  . docusate sodium  100 mg Oral BID   Continuous Infusions: . sodium chloride    . sodium chloride    . aztreonam    . lactated ringers with kcl 100 mL/hr at 05/12/20 2145  . lactated ringers        LOS: 2 days   Charlynne Cousins  [facility]  05/13/2020, 7:20 AM

## 2020-05-14 LAB — RENAL FUNCTION PANEL
Albumin: 2.8 g/dL — ABNORMAL LOW (ref 3.5–5.0)
Anion gap: 12 (ref 5–15)
BUN: 70 mg/dL — ABNORMAL HIGH (ref 8–23)
CO2: 24 mmol/L (ref 22–32)
Calcium: 7 mg/dL — ABNORMAL LOW (ref 8.9–10.3)
Chloride: 100 mmol/L (ref 98–111)
Creatinine, Ser: 10.84 mg/dL — ABNORMAL HIGH (ref 0.61–1.24)
GFR calc Af Amer: 5 mL/min — ABNORMAL LOW (ref >60)
GFR calc non Af Amer: 4 mL/min — ABNORMAL LOW (ref >60)
Glucose, Bld: 99 mg/dL (ref 70–99)
Phosphorus: 4.4 mg/dL (ref 2.5–4.6)
Potassium: 3.7 mmol/L (ref 3.5–5.1)
Sodium: 136 mmol/L (ref 135–145)

## 2020-05-14 LAB — PARATHYROID HORMONE, INTACT (NO CA): PTH: 266 pg/mL — ABNORMAL HIGH (ref 15–65)

## 2020-05-14 NOTE — Progress Notes (Signed)
[facility] PROGRESS NOTE    Progress Note  Manuel Ortega  PPI:[id number] DOB: 03-14-1951 DOA: 05/11/2020 PCP: System, Pcp Not In     Brief Narrative:   Manuel Ortega is an 69 y.o. male past medical history significant of depression and hypertension noncompliant with his medication with no primary care doctor for over the last 5 years presented to the [facility] long hospital with nausea vomiting that started 4 days prior to admission.  In the ED he was found to have a white count of 13 BUN of 127 creatinine of 18 potassium was 2.6 anion gap of 29 and a bicarb of 23.  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  Nephrology was consulted, as well as IR for tunneled HD catheter.  Assessment/Plan:   Acute kidney injury ([place]) agree due to obstructive uropathy/nausea and vomiting: Nausea vomiting likely due to uremic symptoms, now resolved. IR was consulted and HD tunneled catheter placed on 05/12/2020. Nephrology has been consulted. PSA of 28 urine cultures negative till date. Further management per dialysis appreciate assistance.  Hypokalemia/hypomagnesemia/hypocalcemia and secondary hyperphosphatemia secondary to #1: Continue follow electrolytes. Potassium is improved. Phosphorus persistently high, magnesium 1.2. Further management per renal.  Anemia of chronic renal disease vomiting: Iron studies have been sent, IV iron and iron as per renal.  Leukocytosis: Likely reactive.  He was started on aztreonam for possible UTI urine cultures are pending.  Elevated lipase: Likely due to nausea and vomiting he has no pancreatitis.  DVT prophylaxis: lovenox Family Communication:none Status is: Inpatient  Remains inpatient appropriate because:Hemodynamically unstable   Dispo: The patient is from: Home              Anticipated d/c is to: Home              Anticipated d/c date is: > 3 days              Patient currently is not medically stable to  d/c.        Code Status:     Code Status Orders  (From admission, onward)         Start     Ordered   05/11/20 2231  Full code  Continuous        05/11/20 2230        Code Status History    This patient has a current code status but no historical code status.   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 Patient also with elevated white blood cell count. As such, request made for placement of a temporary dialysis catheter for the initiation of dialysis. EXAM: NON-TUNNELED CENTRAL VENOUS HEMODIALYSIS CATHETER PLACEMENT WITH ULTRASOUND AND FLUOROSCOPIC GUIDANCE COMPARISON:  None. Anesthesia/sedation: Moderate (conscious) sedation was employed during this procedure. A total of Fentanyl 100 mcg was administered intravenously. Moderate Sedation Time: 10 minutes. The patient's level of consciousness and vital signs were monitored continuously by radiology nursing throughout the procedure under my direct supervision. MEDICATIONS: None FLUOROSCOPY TIME:  24 seconds (7 mGy) COMPLICATIONS: None immediate. PROCEDURE: Informed written consent was obtained from the patient after a discussion of the risks, benefits, and alternatives to treatment. Questions regarding the procedure were  encouraged and answered. The right neck and chest were prepped with chlorhexidine in a sterile fashion, and a sterile drape was applied covering the operative field. Maximum barrier sterile technique with sterile gowns and gloves were used for the procedure. A timeout was performed prior to the initiation of the procedure. After the overlying soft tissues were anesthetized, a small venotomy incision was created and a  micropuncture kit was utilized to access the internal jugular vein. Real-time ultrasound guidance was utilized for vascular access including the acquisition of a permanent ultrasound image documenting patency of the accessed vessel. The microwire was utilized to measure appropriate catheter length. A stiff glidewire was advanced to the level of the IVC. Under fluoroscopic guidance, the venotomy was serially dilated, ultimately allowing placement of a 20 cm temporary Trialysis catheter with tip ultimately terminating within the superior aspect of the right atrium. Final catheter positioning was confirmed and documented with a spot radiographic image. The catheter aspirates and flushes normally. The catheter was flushed with appropriate volume heparin dwells. The catheter exit site was secured with a 0-Prolene retention suture. A dressing was placed. The patient tolerated the procedure well without immediate post procedural complication. IMPRESSION: Successful placement of a right internal jugular approach 20 cm temporary dialysis catheter with tip terminating with in the superior aspect of the right atrium. The catheter is ready for immediate use. PLAN: This catheter may be converted to a tunneled dialysis catheter at a later date as indicated. Note, given obstructive uropathy seen on preceding noncontrast abdominal CT performed 05/11/2020 would have low threshold for repeat noncontrast abdominal CT in the coming days to ensure adequate urinary diversion following Foley catheter insertion. Otherwise, would give consideration for urologic referral for evaluation for potential bilateral double-J ureteral stent placement and ultimately if not a ureteral stent candidate, for bilateral percutaneous nephrostomy catheter placement. Electronically Signed   By: Sandi Mariscal M.D.   On: 05/12/2020 17:29   IR US Guide Vasc Access Right  Result Date: 05/12/2020 INDICATION: Obstructive uropathy secondary to BPH now with acute  on chronic renal insufficiency, hopes to be improved with Foley catheter insertion and initiation of dialysis. Patient also with elevated white blood cell count. As such, request made for placement of a temporary dialysis catheter for the initiation of dialysis. EXAM: NON-TUNNELED CENTRAL VENOUS HEMODIALYSIS CATHETER PLACEMENT WITH ULTRASOUND AND FLUOROSCOPIC GUIDANCE COMPARISON:  None. Anesthesia/sedation: Moderate (conscious) sedation was employed during this procedure. A total of Fentanyl 100 mcg was administered intravenously. Moderate Sedation Time: 10 minutes. The patient's level of consciousness and vital signs were monitored continuously by radiology nursing throughout the procedure under my direct supervision. MEDICATIONS: None FLUOROSCOPY TIME:  24 seconds (7 mGy) COMPLICATIONS: None immediate. PROCEDURE: Informed written consent was obtained from the patient after a discussion of the risks, benefits, and alternatives to treatment. Questions regarding the procedure were encouraged and answered. The right neck and chest were prepped with chlorhexidine in a sterile fashion, and a sterile drape was applied covering the operative field. Maximum barrier sterile technique with sterile gowns and gloves were used for the procedure. A timeout was performed prior to the initiation of the procedure. After the overlying soft tissues were anesthetized, a small venotomy incision was created and a micropuncture kit was utilized to access the internal jugular vein. Real-time ultrasound guidance was utilized for vascular access including the acquisition of a permanent ultrasound image documenting patency of the accessed vessel. The microwire was utilized to measure appropriate catheter length. A stiff glidewire was advanced  to the level of the IVC. Under fluoroscopic guidance, the venotomy was serially dilated, ultimately allowing placement of a 20 cm temporary Trialysis catheter with tip ultimately terminating within the  superior aspect of the right atrium. Final catheter positioning was confirmed and documented with a spot radiographic image. The catheter aspirates and flushes normally. The catheter was flushed with appropriate volume heparin dwells. The catheter exit site was secured with a 0-Prolene retention suture. A dressing was placed. The patient tolerated the procedure well without immediate post procedural complication. IMPRESSION: Successful placement of a right internal jugular approach 20 cm temporary dialysis catheter with tip terminating with in the superior aspect of the right atrium. The catheter is ready for immediate use. PLAN: This catheter may be converted to a tunneled dialysis catheter at a later date as indicated. Note, given obstructive uropathy seen on preceding noncontrast abdominal CT performed 05/11/2020 would have low threshold for repeat noncontrast abdominal CT in the coming days to ensure adequate urinary diversion following Foley catheter insertion. Otherwise, would give consideration for urologic referral for evaluation for potential bilateral double-J ureteral stent placement and ultimately if not a ureteral stent candidate, for bilateral percutaneous nephrostomy catheter placement. Electronically Signed   By: Sandi Mariscal M.D.   On: 05/12/2020 17:29     Medical Consultants:    None.  Anti-Infectives:   none  Subjective:    Manuel Ortega relates his appetite has returned.    Objective:    Vitals:   05/14/20 0044 05/14/20 0045 05/14/20 0528 05/14/20 0754  BP: (!) 152/78 (!) 152/78 (!) 150/78 (!) 157/87  Pulse: 83 83 84 88  Resp: _0 Temp: 97.6 F (36.4 C) 97.6 F (36.4 C) 98.8 F (37.1 C) 98.6 F (37 C)  TempSrc: Oral Oral Oral Oral  SpO2: 100% 100% 100% 100%  Weight: (!) 99.4 kg     Height:       SpO2: 100 % O2 Flow Rate (L/min): 2 L/min   Intake/Output Summary (Last 24 hours) at 05/14/2020 0825 Last data filed at 05/14/2020 0200 Gross per 24 hour   Intake 2678.3 ml  Output 1600 ml  Net 1078.3 ml   Filed Weights   05/13/20 1214 05/13/20 1414 05/14/20 0044  Weight: (!) 96.6 kg (!) 96.6 kg (!) 99.4 kg    Exam: General exam: In no acute distress. Respiratory system: Good air movement and clear to auscultation. Cardiovascular system: S1 & S2 heard, RRR. No JVD. Gastrointestinal system: Abdomen is nondistended, soft and nontender.  Extremities: No pedal edema. Skin: No rashes, lesions or ulcers Psychiatry: Judgement and insight appear normal. Mood & affect appropriate.   Data Reviewed:    Labs: Basic Metabolic Panel: Recent Labs  Lab 05/11/20 1459 05/11/20 1459 05/12/20 0035 05/12/20 0035 05/12/20 0502 05/12/20 0502 05/12/20 1042 05/12/20 1042 05/13/20 0730 05/13/20 0730 05/13/20 1455 05/14/20 0650  NA 139  137   < > 138   < > 136  --  137  --  138  --  136 136  K 2.7*  2.6*   < > 3.8   < > 3.6   < > 3.7   < > 3.7   < > 3.8 3.7  CL 88*  85*   < > 91*   < > 91*  --  93*  --  98  --  100 100  CO2 22  23   < > 22   < > 23  --  23  --  23  --  23 24  GLUCOSE 138*  140*   < > 121*   < > 119*  --  107*  --  104*  --  121* 99  BUN 96*  127*   < > 102*   < > 131*  --  126*  --  114*  --  75* 70*  CREATININE 18.64*  18.17*   < > 17.60*   < > 17.02*  --  16.69*  --  15.60*  --  10.85* 10.84*  CALCIUM 6.4*  6.4*   < > 6.9*   < > 7.1*  --  6.7*  --  6.9*  --  7.2* 7.0*  MG 1.5*  --   --   --   --   --   --   --   --   --   --   --   PHOS 5.4*  5.2*  --  6.7*  --   --   --  6.5*  --   --   --  4.2 4.4   < > = values in this interval not displayed.   GFR Estimated Creatinine Clearance: 8.1 mL/min (A) (by C-G formula based on SCr of 10.84 mg/dL (H)). Liver Function Tests: Recent Labs  Lab 05/11/20 1459 05/12/20 0035 05/12/20 0502 05/12/20 1042 05/13/20 0730 05/13/20 1455 05/14/20 0650  AST 12*  --  11*  --  10*  --   --   ALT 9  --  6  --  6  --   --   ALKPHOS 77  --  68  --  62  --   --   BILITOT 0.6  --   0.6  --  1.0  --   --   PROT 7.6  --  6.1*  --  5.9*  --   --   ALBUMIN 4.3  4.1   < > 3.4* 3.5 3.0* 2.9* 2.8*   < > = values in this interval not displayed.   Recent Labs  Lab 05/11/20 1459  LIPASE 190*   No results for input(s): AMMONIA in the last 168 hours. Coagulation profile Recent Labs  Lab 05/12/20 0502  INR 1.1   COVID-19 Labs  Recent Labs    05/13/20 1318  FERRITIN 235    Lab Results  Component Value Date   SARSCOV2NAA NEGATIVE 05/11/2020    CBC: Recent Labs  Lab 05/11/20 1459 05/12/20 0502  WBC 13.6* 16.2*  HGB 9.8* 9.5*  HCT 29.5* 28.4*  MCV 81.5 82.8  PLT 428* 354   Cardiac Enzymes: No results for input(s): CKTOTAL, CKMB, CKMBINDEX, TROPONINI in the last 168 hours. BNP (last 3 results) No results for input(s): PROBNP in the last 8760 hours. CBG: No results for input(s): GLUCAP in the last 168 hours. D-Dimer: No results for input(s): DDIMER in the last 72 hours. Hgb A1c: No results for input(s): HGBA1C in the last 72 hours. Lipid Profile: No results for input(s): CHOL, HDL, LDLCALC, TRIG, CHOLHDL, LDLDIRECT in the last 72 hours. Thyroid function studies: No results for input(s): TSH, T4TOTAL, T3FREE, THYROIDAB in the last 72 hours.  Invalid input(s): FREET3 Anemia work up: Recent Labs    05/13/20 1318  FERRITIN 235  TIBC 169*  IRON 44*   Sepsis Labs: Recent Labs  Lab 05/11/20 1459 05/12/20 0502  WBC 13.6* 16.2*   Microbiology Recent Results (from the past 240 hour(s))  SARS Coronavirus 2 by RT PCR (hospital order, performed in [facility] hospital lab) Nasopharyngeal Nasopharyngeal Swab     Status: None   Collection Time: 05/11/20  5:41 PM   Specimen: Nasopharyngeal Swab  Result Value Ref Range Status   SARS Coronavirus 2 NEGATIVE NEGATIVE Final    Comment: (NOTE) SARS-CoV-2 target nucleic acids are NOT DETECTED.  The SARS-CoV-2 RNA is generally detectable in upper and lower respiratory specimens during the acute phase of  infection. The lowest concentration of SARS-CoV-2 viral copies this assay can detect is 250 copies / mL. A negative result does not preclude SARS-CoV-2 infection and should not be used as the sole basis for treatment or other patient management decisions.  A negative result may occur with improper specimen collection / handling, submission of specimen other than nasopharyngeal swab, presence of viral mutation(s) within the areas targeted by this assay, and inadequate number of viral copies (<250 copies / mL). A negative result must be combined with clinical observations, patient history, and epidemiological information.  Fact Sheet for Patients:   StrictlyIdeas.no  Fact Sheet for Healthcare Providers: BankingDealers.co.za  This test is not yet approved or  cleared by the [place] FDA and has been authorized for detection and/or diagnosis of SARS-CoV-2 by FDA under an Emergency Use Authorization (EUA).  This EUA will remain in effect (meaning this test can be used) for the duration of the COVID-19 declaration under Section 564(b)(1) of the Act, 21 U.S.C. section 360bbb-3(b)(1), unless the authorization is terminated or revoked sooner.  Performed at [facility], [place] [address], [place], [place] [address]   Culture, Urine     Status: None   Collection Time: 05/11/20  6:05 PM   Specimen: Urine, Clean Catch  Result Value Ref Range Status   Specimen Description   Final    URINE, CLEAN CATCH Performed at [facility], [place] [address], [place], [place] [address]    Special Requests   Final    NONE Performed at [facility], [place] [address] [place], [place] [address]    Culture   Final    NO GROWTH Performed at [facility], [place] [address], [place],  [address]    Report Status 05/13/2020 FINAL  Final     Medications:   . calcium carbonate  800 mg of  elemental calcium Oral TID  . Chlorhexidine Gluconate Cloth  6 each Topical Q0600  . docusate sodium  100 mg Oral BID   Continuous Infusions: . sodium chloride    . sodium chloride    . aztreonam 0.5 g (05/13/20 2133)      LOS: 3 days   Charlynne Cousins  [facility]  05/14/2020, 8:25 AM

## 2020-05-14 NOTE — Progress Notes (Signed)
[facility] Progress Note   Subjective/Interval Events  Did short HD yesterday for uremia "it was easy !" -  BUN dec from 114 to 75.  1800 of  UOP   Labs pending this AM-  He feels better, feels like he wants to eat  Objective Vitals:   05/13/20 1945 05/14/20 0044 05/14/20 0045 05/14/20 0528  BP: (!) 160/97 (!) 152/78 (!) 152/78 (!) 150/78  Pulse: 95 83 83 84  Resp: '17 15 15 15  '$ Temp: 99.5 F (37.5 C) 97.6 F (36.4 C) 97.6 F (36.4 C) 98.8 F (37.1 C)  TempSrc: Oral Oral Oral Oral  SpO2: 100% 100% 100% 100%  Weight:  (!) 99.4 kg    Height:       Physical Exam General: nad-  Right sided vascath in place Heart: RRR, no rub Lungs: clear Abdomen: soft, NABS Extremities: no edema GU: foley draining blood tinged urine without clots Neuro: seeming better   Additional Objective Labs: Basic Metabolic Panel: Recent Labs  Lab 05/12/20 0035 05/12/20 0502 05/12/20 1042 05/13/20 0730 05/13/20 1455  NA 138   < > 137 138 136  K 3.8   < > 3.7 3.7 3.8  CL 91*   < > 93* 98 100  CO2 22   < > '23 23 23  '$ GLUCOSE 121*   < > 107* 104* 121*  BUN 102*   < > 126* 114* 75*  CREATININE 17.60*   < > 16.69* 15.60* 10.85*  CALCIUM 6.9*   < > 6.7* 6.9* 7.2*  PHOS 6.7*  --  6.5*  --  4.2   < > = values in this interval not displayed.   Liver Function Tests: Recent Labs  Lab 05/11/20 1459 05/12/20 0035 05/12/20 0502 05/12/20 0502 05/12/20 1042 05/13/20 0730 05/13/20 1455  AST 12*  --  11*  --   --  10*  --   ALT 9  --  6  --   --  6  --   ALKPHOS 77  --  68  --   --  62  --   BILITOT 0.6  --  0.6  --   --  1.0  --   PROT 7.6  --  6.1*  --   --  5.9*  --   ALBUMIN 4.3   4.1   < > 3.4*   < > 3.5 3.0* 2.9*   < > = values in this interval not displayed.   Recent Labs  Lab 05/11/20 1459  LIPASE 190*   CBC: Recent Labs  Lab 05/11/20 1459 05/12/20 0502  WBC 13.6* 16.2*  HGB 9.8* 9.5*  HCT 29.5* 28.4*  MCV 81.5 82.8  PLT 428* 354   Blood Culture     Component Value Date/Time   SDES  05/11/2020 1805    URINE, CLEAN CATCH Performed at [facility], [place] [address] [place], [place] [address]    SPECREQUEST  05/11/2020 1805    NONE Performed at [facility], [place] [address], [place], [place] [address]    CULT  05/11/2020 1805    NO GROWTH Performed at [facility] [address], [place], [place] [address]    REPTSTATUS 05/13/2020 FINAL 05/11/2020 1805    Cardiac Enzymes: No results for input(s): CKTOTAL, CKMB, CKMBINDEX, TROPONINI in the last 168 hours. CBG: No results for input(s): GLUCAP in the last 168 hours. Iron Studies:  Recent Labs    05/13/20 1318  IRON  44*  TIBC 169*  FERRITIN 235   '@lablastinr3'$ @ Studies/Results: IR Fluoro Guide CV Line Right  Result Date: 05/12/2020 INDICATION: Obstructive uropathy secondary to BPH now with acute on chronic renal insufficiency, hopes to be improved with Foley catheter insertion and initiation of dialysis. Patient also with elevated white blood cell count. As such, request made for placement of a temporary dialysis catheter for the initiation of dialysis. EXAM: NON-TUNNELED CENTRAL VENOUS HEMODIALYSIS CATHETER PLACEMENT WITH ULTRASOUND AND FLUOROSCOPIC GUIDANCE COMPARISON:  None. Anesthesia/sedation: Moderate (conscious) sedation was employed during this procedure. A total of Fentanyl 100 mcg was administered intravenously. Moderate Sedation Time: 10 minutes. The patient's level of consciousness and vital signs were monitored continuously by radiology nursing throughout the procedure under my direct supervision. MEDICATIONS: None FLUOROSCOPY TIME:  24 seconds (7 mGy) COMPLICATIONS: None immediate. PROCEDURE: Informed written consent was obtained from the patient after a discussion of the risks, benefits, and alternatives to treatment. Questions regarding the procedure were encouraged and answered. The right neck and chest were prepped with  chlorhexidine in a sterile fashion, and a sterile drape was applied covering the operative field. Maximum barrier sterile technique with sterile gowns and gloves were used for the procedure. A timeout was performed prior to the initiation of the procedure. After the overlying soft tissues were anesthetized, a small venotomy incision was created and a micropuncture kit was utilized to access the internal jugular vein. Real-time ultrasound guidance was utilized for vascular access including the acquisition of a permanent ultrasound image documenting patency of the accessed vessel. The microwire was utilized to measure appropriate catheter length. A stiff glidewire was advanced to the level of the IVC. Under fluoroscopic guidance, the venotomy was serially dilated, ultimately allowing placement of a 20 cm temporary Trialysis catheter with tip ultimately terminating within the superior aspect of the right atrium. Final catheter positioning was confirmed and documented with a spot radiographic image. The catheter aspirates and flushes normally. The catheter was flushed with appropriate volume heparin dwells. The catheter exit site was secured with a 0-Prolene retention suture. A dressing was placed. The patient tolerated the procedure well without immediate post procedural complication. IMPRESSION: Successful placement of a right internal jugular approach 20 cm temporary dialysis catheter with tip terminating with in the superior aspect of the right atrium. The catheter is ready for immediate use. PLAN: This catheter may be converted to a tunneled dialysis catheter at a later date as indicated. Note, given obstructive uropathy seen on preceding noncontrast abdominal CT performed 05/11/2020 would have low threshold for repeat noncontrast abdominal CT in the coming days to ensure adequate urinary diversion following Foley catheter insertion. Otherwise, would give consideration for urologic referral for evaluation for  potential bilateral double-J ureteral stent placement and ultimately if not a ureteral stent candidate, for bilateral percutaneous nephrostomy catheter placement. Electronically Signed   By: Sandi Mariscal M.D.   On: 05/12/2020 17:29   IR US Guide Vasc Access Right  Result Date: 05/12/2020 INDICATION: Obstructive uropathy secondary to BPH now with acute on chronic renal insufficiency, hopes to be improved with Foley catheter insertion and initiation of dialysis. Patient also with elevated white blood cell count. As such, request made for placement of a temporary dialysis catheter for the initiation of dialysis. EXAM: NON-TUNNELED CENTRAL VENOUS HEMODIALYSIS CATHETER PLACEMENT WITH ULTRASOUND AND FLUOROSCOPIC GUIDANCE COMPARISON:  None. Anesthesia/sedation: Moderate (conscious) sedation was employed during this procedure. A total of Fentanyl 100 mcg was administered intravenously. Moderate Sedation Time: 10 minutes. The patient's level of  consciousness and vital signs were monitored continuously by radiology nursing throughout the procedure under my direct supervision. MEDICATIONS: None FLUOROSCOPY TIME:  24 seconds (7 mGy) COMPLICATIONS: None immediate. PROCEDURE: Informed written consent was obtained from the patient after a discussion of the risks, benefits, and alternatives to treatment. Questions regarding the procedure were encouraged and answered. The right neck and chest were prepped with chlorhexidine in a sterile fashion, and a sterile drape was applied covering the operative field. Maximum barrier sterile technique with sterile gowns and gloves were used for the procedure. A timeout was performed prior to the initiation of the procedure. After the overlying soft tissues were anesthetized, a small venotomy incision was created and a micropuncture kit was utilized to access the internal jugular vein. Real-time ultrasound guidance was utilized for vascular access including the acquisition of a permanent  ultrasound image documenting patency of the accessed vessel. The microwire was utilized to measure appropriate catheter length. A stiff glidewire was advanced to the level of the IVC. Under fluoroscopic guidance, the venotomy was serially dilated, ultimately allowing placement of a 20 cm temporary Trialysis catheter with tip ultimately terminating within the superior aspect of the right atrium. Final catheter positioning was confirmed and documented with a spot radiographic image. The catheter aspirates and flushes normally. The catheter was flushed with appropriate volume heparin dwells. The catheter exit site was secured with a 0-Prolene retention suture. A dressing was placed. The patient tolerated the procedure well without immediate post procedural complication. IMPRESSION: Successful placement of a right internal jugular approach 20 cm temporary dialysis catheter with tip terminating with in the superior aspect of the right atrium. The catheter is ready for immediate use. PLAN: This catheter may be converted to a tunneled dialysis catheter at a later date as indicated. Note, given obstructive uropathy seen on preceding noncontrast abdominal CT performed 05/11/2020 would have low threshold for repeat noncontrast abdominal CT in the coming days to ensure adequate urinary diversion following Foley catheter insertion. Otherwise, would give consideration for urologic referral for evaluation for potential bilateral double-J ureteral stent placement and ultimately if not a ureteral stent candidate, for bilateral percutaneous nephrostomy catheter placement. Electronically Signed   By: Sandi Mariscal M.D.   On: 05/12/2020 17:29   Medications:  sodium chloride     sodium chloride     aztreonam 0.5 g (05/13/20 2133)   lactated ringers with kcl 100 mL/hr at 05/14/20 0511   lactated ringers      calcium carbonate  800 mg of elemental calcium Oral TID   Chlorhexidine Gluconate Cloth  6 each Topical Q0600    docusate sodium  100 mg Oral BID   Results for EMONI, YANG (MRN [id number]) as of 05/13/2020 14:16  Ref. Range 05/12/2020 00:30  Prostatic Specific Antigen Latest Ref Range: 0.00 - 4.00 ng/mL 28.60 (H)    Assessment/Plan 1. AKI from obstructive uropathy, uremia present without significant initial improvement with placement of foley.  Decided to do HD on 8/1 for uremia with clinical improvement-  Will not plan for HD today-  Want to see what BUN and crt do from today to tomorrow and then do HD PRN.  Unclear if HD will need to be continued 2. 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 Elevated PSA from #2, probably, will need uro f/u 4. Hypokalemia, likely due to gastrointestinal losses; resolved with supplement 5. Hypocalcemia from severity of renal failure, improving 6. Hypomagnesemia from gastrointestinal losses, repleted 7.  Hyperphosphatemia secondary to #1 8. Metabolic acidosis and alkalosis, from renal failure and gatric losses + PO NaHCO3 intake; AG down.  Was being repleted with lactated ringers, will stop now that eating and drinking 9. Depression 10. Normocytic anemia, TSAT 26%- no action yet     [name]  05/14/2020, 7:28 AM  [employer]

## 2020-05-15 LAB — RENAL FUNCTION PANEL
Albumin: 2.9 g/dL — ABNORMAL LOW (ref 3.5–5.0)
Anion gap: 11 (ref 5–15)
BUN: 70 mg/dL — ABNORMAL HIGH (ref 8–23)
CO2: 22 mmol/L (ref 22–32)
Calcium: 7.1 mg/dL — ABNORMAL LOW (ref 8.9–10.3)
Chloride: 100 mmol/L (ref 98–111)
Creatinine, Ser: 10.34 mg/dL — ABNORMAL HIGH (ref 0.61–1.24)
GFR calc Af Amer: 5 mL/min — ABNORMAL LOW (ref >60)
GFR calc non Af Amer: 5 mL/min — ABNORMAL LOW (ref >60)
Glucose, Bld: 91 mg/dL (ref 70–99)
Phosphorus: 4.8 mg/dL — ABNORMAL HIGH (ref 2.5–4.6)
Potassium: 3.5 mmol/L (ref 3.5–5.1)
Sodium: 133 mmol/L — ABNORMAL LOW (ref 135–145)

## 2020-05-15 LAB — PREPARE RBC (CROSSMATCH)

## 2020-05-15 LAB — CBC
HCT: 19.5 % — ABNORMAL LOW (ref 39.0–52.0)
Hemoglobin: 5.9 g/dL — CL (ref 13.0–17.0)
MCH: 26.8 pg (ref 26.0–34.0)
MCHC: 30.3 g/dL (ref 30.0–36.0)
MCV: 88.6 fL (ref 80.0–100.0)
Platelets: 243 10*3/uL (ref 150–400)
RBC: 2.2 MIL/uL — ABNORMAL LOW (ref 4.22–5.81)
RDW: 13.4 % (ref 11.5–15.5)
WBC: 12.2 10*3/uL — ABNORMAL HIGH (ref 4.0–10.5)
nRBC: 0 % (ref 0.0–0.2)

## 2020-05-15 LAB — ABO/RH: ABO/RH(D): O POS

## 2020-05-15 MED ORDER — TAMSULOSIN HCL 0.4 MG PO CAPS
0.8000 mg | ORAL_CAPSULE | Freq: Every day | ORAL | Status: DC
  Administered 2020-05-15 – 2020-05-31 (×17): 0.8 mg via ORAL
  Filled 2020-05-15 (×17): qty 2

## 2020-05-15 MED ORDER — CHLORHEXIDINE GLUCONATE CLOTH 2 % EX PADS
6.0000 | MEDICATED_PAD | Freq: Every day | CUTANEOUS | Status: DC
  Administered 2020-05-15 – 2020-05-25 (×7): 6 via TOPICAL

## 2020-05-15 MED ORDER — SODIUM CHLORIDE 0.9 % IV SOLN
510.0000 mg | Freq: Once | INTRAVENOUS | Status: AC
  Administered 2020-05-15: 510 mg via INTRAVENOUS
  Filled 2020-05-15: qty 17

## 2020-05-15 MED ORDER — SODIUM CHLORIDE 0.9% IV SOLUTION: Freq: Once | INTRAVENOUS | Status: DC

## 2020-05-15 MED ORDER — HEPARIN SODIUM (PORCINE) 1000 UNIT/ML IJ SOLN
INTRAMUSCULAR | Status: AC
  Administered 2020-05-15: 2800 [IU]
  Filled 2020-05-15: qty 3

## 2020-05-15 NOTE — Progress Notes (Signed)
[facility] Progress Note   Subjective/Interval Events  2600 of  UOP   BUN and crt essentially the same-  He continues to feel better   Objective Vitals:   05/14/20 1612 05/14/20 2030 05/15/20 0034 05/15/20 0628  BP: (!) 182/94 (!) 161/98 (!) 151/96 130/66  Pulse: 90 (!) 103 94 94  Resp: 16 17 18 20   Temp: 98.9 F (37.2 C) 98.7 F (37.1 C) 98.6 F (37 C) 98.6 F (37 C)  TempSrc: Oral Oral Oral Oral  SpO2: 100% 100% 100% 100%  Weight:    98.4 kg  Height:       Physical Exam General: nad-  Right sided vascath in place Heart: RRR, no rub Lungs: clear Abdomen: soft, NABS Extremities: no edema GU: foley draining blood tinged urine but clearing  Neuro: seeming better   Additional Objective Labs: Basic Metabolic Panel: Recent Labs  Lab 05/13/20 1455 05/14/20 0650 05/15/20 0640  NA 136 136 133*  K 3.8 3.7 3.5  CL 100 100 100  CO2 23 24 22   GLUCOSE 121* 99 91  BUN 75* 70* 70*  CREATININE 10.85* 10.84* 10.34*  CALCIUM 7.2* 7.0* 7.1*  PHOS 4.2 4.4 4.8*   Liver Function Tests: Recent Labs  Lab 05/11/20 1459 05/12/20 0035 05/12/20 0502 05/12/20 1042 05/13/20 0730 05/13/20 1455 05/14/20 0650  AST 12*  --  11*  --  10*  --   --   ALT 9  --  6  --  6  --   --   ALKPHOS 77  --  68  --  62  --   --   BILITOT 0.6  --  0.6  --  1.0  --   --   PROT 7.6  --  6.1*  --  5.9*  --   --   ALBUMIN 4.3  4.1   < > 3.4*   < > 3.0* 2.9* 2.8*   < > = values in this interval not displayed.   Recent Labs  Lab 05/11/20 1459  LIPASE 190*   CBC: Recent Labs  Lab 05/11/20 1459 05/12/20 0502  WBC 13.6* 16.2*  HGB 9.8* 9.5*  HCT 29.5* 28.4*  MCV 81.5 82.8  PLT 428* 354   Blood Culture    Component Value Date/Time   SDES  05/11/2020 1805    URINE, CLEAN CATCH Performed at [facility], [place] [address] [place], [place] [address]    SPECREQUEST  05/11/2020 1805    NONE Performed at [facility], [place] [address], [place], [place] [address]    CULT  05/11/2020 1805    NO GROWTH Performed at [facility] [address] [place],  [address]    REPTSTATUS 05/13/2020 FINAL 05/11/2020 1805    Cardiac Enzymes: No results for input(s): CKTOTAL, CKMB, CKMBINDEX, TROPONINI in the last 168 hours. CBG: No results for input(s): GLUCAP in the last 168 hours. Iron Studies:  Recent Labs    05/13/20 1318  IRON 44*  TIBC 169*  FERRITIN 235   @lablastinr3 @ Studies/Results: No results found. Medications:  . calcium carbonate  800 mg of elemental calcium Oral TID  . Chlorhexidine Gluconate Cloth  6 each Topical Q0600  . docusate sodium  100 mg Oral BID   Results for RAYQUON, USELMAN (MRN [id number]) as of 05/13/2020 14:16  Ref. Range 05/12/2020 00:30  Prostatic Specific Antigen Latest Ref Range: 0.00 - 4.00 ng/mL 28.60 (H)    Assessment/Plan 1. AKI  from obstructive uropathy, uremia present without significant initial improvement with placement of foley.  Decided to do HD on 8/1 for uremia with clinical improvement-  24 hours with good UOP but BUN and crt not better.  Want to do another HD treatment-  Either today or tomorrow based on the inpatient HD schedule  2. 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 Elevated PSA from #2, probably, will need uro f/u 4. Hypokalemia, likely due to gastrointestinal losses; resolved with supplement 5. Hypocalcemia from severity of renal failure, improving 6. Hypomagnesemia from gastrointestinal losses, repleted 7. Hyperphosphatemia secondary to #1 8. Metabolic acidosis and alkalosis, from renal failure and gatric losses + PO NaHCO3 intake; AG down.  Was being repleted with lactated ringers, will stop now that eating and drinking 9. Depression 10. Normocytic anemia, TSAT 26%- will go ahead and give IV iron      [name]  05/15/2020, 7:27 AM  [employer]

## 2020-05-15 NOTE — Progress Notes (Signed)
PT Cancellation Note  Patient Details Name: Jaziel Bennett MRN: [id number] DOB: 11-Jun-1951   Cancelled Treatment:    Reason Eval/Treat Not Completed: Patient not medically ready   [name] 05/15/2020, 10:00 AM  Anjel Pardo W,PT Acute Rehabilitation Services Pager:  [phone]  Office:  [phone]

## 2020-05-15 NOTE — Progress Notes (Signed)
[facility] PROGRESS NOTE    Progress Note  Hiroyuki Ozanich  DGL:[id number] DOB: [birth year] DOA: 05/11/2020 PCP: System, Pcp Not In     Brief Narrative:   Manuel Ortega is an 69 y.o. male past medical history significant of depression and hypertension noncompliant with his medication with no primary care doctor for over the last 5 years presented to the [facility] long hospital with nausea vomiting that started 4 days prior to admission.  In the ED he was found to have a white count of 13 BUN of 127 creatinine of 18 potassium was 2.6 anion gap of 29 and a bicarb of 23.  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  Nephrology was consulted, as well as IR for tunneled HD catheter.  Assessment/Plan:   Acute kidney injury ([place]) agree due to obstructive uropathy/nausea and vomiting/mild hematuria: - Nausea vomiting likely due to uremic symptoms, now resolved. - IR was consulted and HD tunneled catheter placed on 05/12/2020. - Nephrology has been consulted.  HD started on a 11/02/2019 for uremia.  His uremic symptoms are clinically improved his nausea is improved as well as his appetite.  His creatinine has slightly trended down overnight.  Continue to monitor closely. -His urine output is increasing about 2.5 in the last 24 hours will likely benefit from gentle IV fluid hydration. - PSA of 28 urine cultures negative till date. - Further management per renal, appreciate assistance.  Hypokalemia/hypomagnesemia/hypocalcemia and secondary hyperphosphatemia secondary to #1: - Continue follow electrolytes. - Potassium is improved. - Phosphorus  High. - Further management per HD.  Anemia of chronic renal disease vomiting: -Iron studies have been sent, IV iron and iron as per renal.  Leukocytosis: -Acute renal failure, reactive, is now improving.  Elevated lipase: -Likely due to nausea and vomiting he has no pancreatitis. -Tolerating his diet well.  DVT  prophylaxis: lovenox Family Communication:none Status is: Inpatient  Remains inpatient appropriate because:Hemodynamically unstable   Dispo: The patient is from: Home              Anticipated d/c is to: Home              Anticipated d/c date is: > 3 days              Patient currently is not medically stable to d/c.  Unsure if this position at all depends on his renal function recovery, renal dictating care.     Code Status:     Code Status Orders  (From admission, onward)         Start     Ordered   05/11/20 2231  Full code  Continuous        05/11/20 2230        Code Status History    This patient has a current code status but no historical code status.   Advance Care Planning Activity        IV Access:    Peripheral IV   Procedures and diagnostic studies:   No results found.   Medical Consultants:    None.  Anti-Infectives:   none  Subjective:    Salam Micucci relates he has a good appetite feeling better.  Objective:    Vitals:   05/14/20 2030 05/15/20 0034 05/15/20 0628 05/15/20 0732  BP: (!) 161/98 (!) 151/96 130/66 (!) 143/86  Pulse: (!) 103 94 94 94  Resp: 17 18 20 16   Temp: 98.7 F (37.1 C) 98.6 F (37 C) 98.6 F (  37 C) 98.3 F (36.8 C)  TempSrc: Oral Oral Oral Oral  SpO2: 100% 100% 100% 100%  Weight:   98.4 kg   Height:       SpO2: 100 % O2 Flow Rate (L/min): 2 L/min   Intake/Output Summary (Last 24 hours) at 05/15/2020 0825 Last data filed at 05/15/2020 0600 Gross per 24 hour  Intake 600 ml  Output 2650 ml  Net -2050 ml   Filed Weights   05/13/20 1414 05/14/20 0044 05/15/20 0628  Weight: (!) 96.6 kg (!) 99.4 kg 98.4 kg    Exam: General exam: In no acute distress. Respiratory system: Good air movement and clear to auscultation. Cardiovascular system: S1 & S2 heard, RRR. No JVD. Gastrointestinal system: Abdomen is nondistended, soft and nontender.  Extremities: No pedal edema. Skin: No rashes, lesions or  ulcers Psychiatry: Judgement and insight appear normal. Mood & affect appropriate.  Data Reviewed:    Labs: Basic Metabolic Panel: Recent Labs  Lab 05/11/20 1459 05/11/20 1459 05/12/20 0035 05/12/20 0502 05/12/20 1042 05/12/20 1042 05/13/20 0730 05/13/20 0730 05/13/20 1455 05/13/20 1455 05/14/20 0650 05/15/20 0640  NA 139  137   < > 138   < > 137  --  138  --  136  --  136 133*  K 2.7*  2.6*   < > 3.8   < > 3.7   < > 3.7   < > 3.8   < > 3.7 3.5  CL 88*  85*   < > 91*   < > 93*  --  98  --  100  --  100 100  CO2 22  23   < > 22   < > 23  --  23  --  23  --  24 22  GLUCOSE 138*  140*   < > 121*   < > 107*  --  104*  --  121*  --  99 91  BUN 96*  127*   < > 102*   < > 126*  --  114*  --  75*  --  70* 70*  CREATININE 18.64*  18.17*   < > 17.60*   < > 16.69*  --  15.60*  --  10.85*  --  10.84* 10.34*  CALCIUM 6.4*  6.4*   < > 6.9*   < > 6.7*  --  6.9*  --  7.2*  --  7.0* 7.1*  MG 1.5*  --   --   --   --   --   --   --   --   --   --   --   PHOS 5.4*  5.2*   < > 6.7*  --  6.5*  --   --   --  4.2  --  4.4 4.8*   < > = values in this interval not displayed.   GFR Estimated Creatinine Clearance: 8.4 mL/min (A) (by C-G formula based on SCr of 10.34 mg/dL (H)). Liver Function Tests: Recent Labs  Lab 05/11/20 1459 05/12/20 0035 05/12/20 0502 05/12/20 0502 05/12/20 1042 05/13/20 0730 05/13/20 1455 05/14/20 0650 05/15/20 0640  AST 12*  --  11*  --   --  10*  --   --   --   ALT 9  --  6  --   --  6  --   --   --   ALKPHOS 77  --  68  --   --  62  --   --   --  BILITOT 0.6  --  0.6  --   --  1.0  --   --   --   PROT 7.6  --  6.1*  --   --  5.9*  --   --   --   ALBUMIN 4.3  4.1   < > 3.4*   < > 3.5 3.0* 2.9* 2.8* 2.9*   < > = values in this interval not displayed.   Recent Labs  Lab 05/11/20 1459  LIPASE 190*   No results for input(s): AMMONIA in the last 168 hours. Coagulation profile Recent Labs  Lab 05/12/20 0502  INR 1.1   COVID-19 Labs  Recent Labs     05/13/20 1318  FERRITIN 235    Lab Results  Component Value Date   [place] NEGATIVE 05/11/2020    CBC: Recent Labs  Lab 05/11/20 1459 05/12/20 0502 05/15/20 0640  WBC 13.6* 16.2* 12.2*  HGB 9.8* 9.5* 5.9*  HCT 29.5* 28.4* 19.5*  MCV 81.5 82.8 88.6  PLT 428* 354 243   Cardiac Enzymes: No results for input(s): CKTOTAL, CKMB, CKMBINDEX, TROPONINI in the last 168 hours. BNP (last 3 results) No results for input(s): PROBNP in the last 8760 hours. CBG: No results for input(s): GLUCAP in the last 168 hours. D-Dimer: No results for input(s): DDIMER in the last 72 hours. Hgb A1c: No results for input(s): HGBA1C in the last 72 hours. Lipid Profile: No results for input(s): CHOL, HDL, LDLCALC, TRIG, CHOLHDL, LDLDIRECT in the last 72 hours. Thyroid function studies: No results for input(s): TSH, T4TOTAL, T3FREE, THYROIDAB in the last 72 hours.  Invalid input(s): FREET3 Anemia work up: Recent Labs    05/13/20 1318  FERRITIN 235  TIBC 169*  IRON 44*   Sepsis Labs: Recent Labs  Lab 05/11/20 1459 05/12/20 0502 05/15/20 0640  WBC 13.6* 16.2* 12.2*   Microbiology Recent Results (from the past 240 hour(s))  SARS Coronavirus 2 by RT PCR (hospital order, performed in [facility] hospital lab) Nasopharyngeal Nasopharyngeal Swab     Status: None   Collection Time: 05/11/20  5:41 PM   Specimen: Nasopharyngeal Swab  Result Value Ref Range Status   SARS Coronavirus 2 NEGATIVE NEGATIVE Final    Comment: (NOTE) SARS-CoV-2 target nucleic acids are NOT DETECTED.  The SARS-CoV-2 RNA is generally detectable in upper and lower respiratory specimens during the acute phase of infection. The lowest concentration of SARS-CoV-2 viral copies this assay can detect is 250 copies / mL. A negative result does not preclude SARS-CoV-2 infection and should not be used as the sole basis for treatment or other patient management decisions.  A negative result may occur with improper  specimen collection / handling, submission of specimen other than nasopharyngeal swab, presence of viral mutation(s) within the areas targeted by this assay, and inadequate number of viral copies (<250 copies / mL). A negative result must be combined with clinical observations, patient history, and epidemiological information.  Fact Sheet for Patients:   StrictlyIdeas.no  Fact Sheet for Healthcare Providers: BankingDealers.co.za  This test is not yet approved or  cleared by the [place] FDA and has been authorized for detection and/or diagnosis of SARS-CoV-2 by FDA under an Emergency Use Authorization (EUA).  This EUA will remain in effect (meaning this test can be used) for the duration of the COVID-19 declaration under Section 564(b)(1) of the Act, 21 U.S.C. section 360bbb-3(b)(1), unless the authorization is terminated or revoked sooner.  Performed at [facility], [place]  [address], [place], [place] [address]   Culture, Urine     Status: None   Collection Time: 05/11/20  6:05 PM   Specimen: Urine, Clean Catch  Result Value Ref Range Status   Specimen Description   Final    URINE, CLEAN CATCH Performed at [facility], [place] [address], [place], [place] [address]    Special Requests   Final    NONE Performed at [facility], [place] [address], [place], [place] [address]    Culture   Final    NO GROWTH Performed at [facility], [place] [address], [place], [place] [address]    Report Status 05/13/2020 FINAL  Final     Medications:   . sodium chloride   Intravenous Once  . calcium carbonate  800 mg of elemental calcium Oral TID  . Chlorhexidine Gluconate Cloth  6 each Topical Q0600  . docusate sodium  100 mg Oral BID   Continuous Infusions:     LOS: 4 days   [facility]  05/15/2020, 8:25 AM

## 2020-05-16 ENCOUNTER — Encounter (HOSPITAL_COMMUNITY): Payer: Self-pay | Admitting: Family Medicine

## 2020-05-16 DIAGNOSIS — R310 Gross hematuria: Secondary | ICD-10-CM

## 2020-05-16 DIAGNOSIS — N401 Enlarged prostate with lower urinary tract symptoms: Secondary | ICD-10-CM

## 2020-05-16 DIAGNOSIS — N19 Unspecified kidney failure: Secondary | ICD-10-CM

## 2020-05-16 DIAGNOSIS — D638 Anemia in other chronic diseases classified elsewhere: Secondary | ICD-10-CM

## 2020-05-16 DIAGNOSIS — N138 Other obstructive and reflux uropathy: Secondary | ICD-10-CM

## 2020-05-16 DIAGNOSIS — N1339 Other hydronephrosis: Secondary | ICD-10-CM

## 2020-05-16 LAB — RENAL FUNCTION PANEL
Albumin: 2.7 g/dL — ABNORMAL LOW (ref 3.5–5.0)
Anion gap: 10 (ref 5–15)
BUN: 34 mg/dL — ABNORMAL HIGH (ref 8–23)
CO2: 26 mmol/L (ref 22–32)
Calcium: 7.1 mg/dL — ABNORMAL LOW (ref 8.9–10.3)
Chloride: 100 mmol/L (ref 98–111)
Creatinine, Ser: 6.28 mg/dL — ABNORMAL HIGH (ref 0.61–1.24)
GFR calc Af Amer: 10 mL/min — ABNORMAL LOW (ref >60)
GFR calc non Af Amer: 8 mL/min — ABNORMAL LOW (ref >60)
Glucose, Bld: 96 mg/dL (ref 70–99)
Phosphorus: 3 mg/dL (ref 2.5–4.6)
Potassium: 3.4 mmol/L — ABNORMAL LOW (ref 3.5–5.1)
Sodium: 136 mmol/L (ref 135–145)

## 2020-05-16 LAB — CBC
HCT: 23.3 % — ABNORMAL LOW (ref 39.0–52.0)
Hemoglobin: 7.3 g/dL — ABNORMAL LOW (ref 13.0–17.0)
MCH: 27.5 pg (ref 26.0–34.0)
MCHC: 31.3 g/dL (ref 30.0–36.0)
MCV: 87.9 fL (ref 80.0–100.0)
Platelets: 192 10*3/uL (ref 150–400)
RBC: 2.65 MIL/uL — ABNORMAL LOW (ref 4.22–5.81)
RDW: 13.3 % (ref 11.5–15.5)
WBC: 10.8 10*3/uL — ABNORMAL HIGH (ref 4.0–10.5)
nRBC: 0 % (ref 0.0–0.2)

## 2020-05-16 LAB — HEMOGLOBIN AND HEMATOCRIT, BLOOD
HCT: 24.8 % — ABNORMAL LOW (ref 39.0–52.0)
Hemoglobin: 7.9 g/dL — ABNORMAL LOW (ref 13.0–17.0)

## 2020-05-16 LAB — MAGNESIUM: Magnesium: 1.7 mg/dL (ref 1.7–2.4)

## 2020-05-16 MED ORDER — NEPRO/CARBSTEADY PO LIQD
237.0000 mL | Freq: Two times a day (BID) | ORAL | Status: DC
  Administered 2020-05-16 – 2020-06-01 (×30): 237 mL via ORAL
  Filled 2020-05-16 (×2): qty 237

## 2020-05-16 MED ORDER — ZOLPIDEM TARTRATE 5 MG PO TABS
5.0000 mg | ORAL_TABLET | Freq: Every evening | ORAL | Status: DC | PRN
  Administered 2020-05-16 – 2020-05-30 (×6): 5 mg via ORAL
  Filled 2020-05-16 (×10): qty 1

## 2020-05-16 MED ORDER — DARBEPOETIN ALFA 150 MCG/0.3ML IJ SOSY
150.0000 ug | PREFILLED_SYRINGE | INTRAMUSCULAR | Status: DC
  Administered 2020-05-16: 150 ug via SUBCUTANEOUS
  Filled 2020-05-16 (×3): qty 0.3

## 2020-05-16 MED ORDER — SODIUM CHLORIDE 0.9 % IV SOLN: INTRAVENOUS | Status: DC

## 2020-05-16 NOTE — Progress Notes (Signed)
This RN was called to patient's room by Physical Therapist. Upon arrival to room, PT states that patient passed out while on the bedside commode having a bowel movement, patient was alert and oriented X4. Vitals were checked and are as follows:   05/16/20 0823  Vitals  Temp 98 F (36.7 C)  Temp Source Oral  BP 132/81  MAP (mmHg) 97  BP Location Left Arm  BP Method Automatic  Pulse Rate (!) 128  Level of Consciousness  Level of Consciousness Alert  MEWS COLOR  MEWS Score Color Yellow  Oxygen Therapy  SpO2 98 %  O2 Device Room Air  MEWS Score  MEWS Temp 0  MEWS Systolic 0  MEWS Pulse 2  MEWS RR 0  MEWS LOC 0  MEWS Score 2   Marylyn Ishihara, MD paged and is aware. Patient did not fall to floor, and was not injured during episode. Patient currently states he "feels fine" now and does not remember episode. Denies any chest pain, shortness of breath, or discomfort. Will continue to check vitals signs as per MEWS protocol and more often as needed. Bed alarm currently on, patient advised and educated to call before getting out of bed.

## 2020-05-16 NOTE — Progress Notes (Signed)
Pt is currently in Dialysis.  Pt is showing a MEWS score "Red", at 2200 due to respirations and elevated heart rate.  Pt left to go to dialysis @ 1950  Please refer to VS documented by dialysis staff.

## 2020-05-16 NOTE — Progress Notes (Signed)
[facility] Progress Note   Subjective/Interval Events  At least 925 of UOP   Did another HD treatment late yesterday to provide more clearance-  Also got blood-  2 units for a hgb of 5.9 (no labs from 7/31 to 8/3) probably a slow decline -  Continues to feel better-  Going to work with PT today   Objective Vitals:   05/15/20 2300 05/15/20 2306 05/16/20 0025 05/16/20 0426  BP: 108/70 119/67 (!) 135/91 (!) 142/80  Pulse: 99 100 94 (!) 101  Resp: 18 17 17 17   Temp:  98.9 F (37.2 C) 98.7 F (37.1 C) 99.2 F (37.3 C)  TempSrc:  Oral Oral Oral  SpO2:  98% 100% 98%  Weight:  100.1 kg 97.1 kg   Height:       Physical Exam General: nad-  Right sided vascath in place Heart: RRR, no rub Lungs: clear Abdomen: soft, NABS Extremities: no edema GU: foley draining blood tinged urine but clearing  Neuro:  better   Additional Objective Labs: Basic Metabolic Panel: Recent Labs  Lab 05/14/20 0650 05/15/20 0640 05/16/20 0550  NA 136 133* 136  K 3.7 3.5 3.4*  CL 100 100 100  CO2 24 22 26   GLUCOSE 99 91 96  BUN 70* 70* 34*  CREATININE 10.84* 10.34* 6.28*  CALCIUM 7.0* 7.1* 7.1*  PHOS 4.4 4.8* 3.0   Liver Function Tests: Recent Labs  Lab 05/11/20 1459 05/12/20 0035 05/12/20 0502 05/12/20 1042 05/13/20 0730 05/13/20 1455 05/14/20 0650 05/15/20 0640 05/16/20 0550  AST 12*  --  11*  --  10*  --   --   --   --   ALT 9  --  6  --  6  --   --   --   --   ALKPHOS 77  --  68  --  62  --   --   --   --   BILITOT 0.6  --  0.6  --  1.0  --   --   --   --   PROT 7.6  --  6.1*  --  5.9*  --   --   --   --   ALBUMIN 4.3  4.1   < > 3.4*   < > 3.0*   < > 2.8* 2.9* 2.7*   < > = values in this interval not displayed.   Recent Labs  Lab 05/11/20 1459  LIPASE 190*   CBC: Recent Labs  Lab 05/11/20 1459 05/11/20 1459 05/12/20 0502 05/15/20 0640 05/16/20 0550  WBC 13.6*   < > 16.2* 12.2* 10.8*  HGB 9.8*   < > 9.5* 5.9* 7.3*  HCT 29.5*   < > 28.4* 19.5* 23.3*   MCV 81.5  --  82.8 88.6 87.9  PLT 428*   < > 354 243 192   < > = values in this interval not displayed.   Blood Culture    Component Value Date/Time   SDES  05/11/2020 1805    URINE, CLEAN CATCH Performed at [facility], [place] [address] [place], [place] [address]    SPECREQUEST  05/11/2020 1805    NONE Performed at [facility], [place] [address] [place], [place] [address]    CULT  05/11/2020 1805    NO GROWTH Performed at [place] [address], [place], [place] [address]    REPTSTATUS 05/13/2020 FINAL 05/11/2020 1805    Cardiac Enzymes: No results  for input(s): CKTOTAL, CKMB, CKMBINDEX, TROPONINI in the last 168 hours. CBG: No results for input(s): GLUCAP in the last 168 hours. Iron Studies:  Recent Labs    05/13/20 1318  IRON 44*  TIBC 169*  FERRITIN 235   @[email or web address] @ Studies/Results: No results found. Medications:  . sodium chloride   Intravenous Once  . calcium carbonate  800 mg of elemental calcium Oral TID  . Chlorhexidine Gluconate Cloth  6 each Topical Q0600  . Chlorhexidine Gluconate Cloth  6 each Topical Q0600  . docusate sodium  100 mg Oral BID  . tamsulosin  0.8 mg Oral QPC supper   Results for YAZEED, PRYER (MRN [id number]) as of 05/13/2020 14:16  Ref. Range 05/12/2020 00:30  Prostatic Specific Antigen Latest Ref Range: 0.00 - 4.00 ng/mL 28.60 (H)    Assessment/Plan 1. AKI from obstructive uropathy, uremia present without significant initial improvement with placement of foley.  Did HD on 8/1 for uremia with clinical improvement-  24 hours with good UOP but BUN and crt not better.  another HD treatment on 8/3-  Now need to watch numbers to assess renal recovery  2. 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 Elevated PSA from #2, probably, will need uro f/u-  Would contact them to get an OP plan  4. Hypokalemia, likely due to  gastrointestinal losses; resolved with supplement 5. Hypocalcemia from severity of renal failure, improving 6. Hypomagnesemia from gastrointestinal losses, repleted 7. Hyperphosphatemia secondary to #1- better 8. Metabolic acidosis and alkalosis, from renal failure and gatric losses + PO NaHCO3 intake; AG down.  Was being repleted with lactated ringers, will stop now that eating and drinking- resolved 9. Depression 10. Normocytic anemia, TSAT 26%- gave IV iron -  Also discovered yest to have a hgb under 6-  Got 2 units PRBC-  hgb 7's today-  Will dose with ESA as well     [name]  05/16/2020, 7:55 AM  [facility]

## 2020-05-16 NOTE — Progress Notes (Signed)
   05/16/20 0823  Assess: MEWS Score  Temp 98 F (36.7 C)  BP 132/81  Pulse Rate (!) 128  Resp 18  Level of Consciousness Alert  SpO2 98 %  O2 Device Room Air  Patient Activity (if Appropriate) In bed  Assess: MEWS Score  MEWS Temp 0  MEWS Systolic 0  MEWS Pulse 2  MEWS RR 0  MEWS LOC 0  MEWS Score 2  MEWS Score Color Yellow  Assess: if the MEWS score is Yellow or Red  Were vital signs taken at a resting state? Yes  Focused Assessment Change from prior assessment (see assessment flowsheet)  Early Detection of Sepsis Score *See Row Information* Low  MEWS guidelines implemented *See Row Information* Yes  Treat  MEWS Interventions Escalated (See documentation below)  Pain Scale 0-10  Pain Score 0  Patients response to intervention Effective  Take Vital Signs  Increase Vital Sign Frequency  Yellow: Q 2hr X 2 then Q 4hr X 2, if remains yellow, continue Q 4hrs  Escalate  MEWS: Escalate Yellow: discuss with charge nurse/RN and consider discussing with provider and RRT  Notify: Charge Nurse/RN  Name of Charge Nurse/RN Notified Beverlee Nims RN  Date Charge Nurse/RN Notified 05/16/20  Time Charge Nurse/RN Notified [id number]  Notify: Provider  Provider Name/Title Kyle,MD  Date Provider Notified 05/16/20  Time Provider Notified [phone]  Notification Type Page  Notification Reason Change in status  Response See new orders  Date of Provider Response 05/16/20  Time of Provider Response 0825  Notify: Rapid Response  Name of Rapid Response RN Notified none  Document  Patient Outcome Stabilized after interventions  Progress note created (see row info) Yes

## 2020-05-16 NOTE — Progress Notes (Signed)
PROGRESS NOTE    Rafi Kenneth  LPF:[id number] DOB: [birth year] DOA: 05/11/2020 PCP: System, Pcp Not In   Brief Narrative:   Johnney Scarlata is an 69 y.o. male past medical history significant of depression and hypertension noncompliant with his medication with no primary care doctor for over the last 5 years presented to the [facility] long hospital with nausea vomiting that started 4 days prior to admission.  In the ED he was found to have a white count of 13 BUN of 127 creatinine of 18 potassium was 2.6 anion gap of 29 and a bicarb of 23.  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  Nephrology was consulted, as well as IR for tunneled HD catheter.  8/4: syncopal episode while having BM this AM. Denies symptoms now. Will get some fluids. Gross hematuria noted. Consulted urology. Appreciate assistance.    Assessment & Plan: Acute kidney injury due to obstructive uropathy Uremia N/V     - Nausea vomiting likely due to uremic symptoms, now resolved.     - IR was consulted and HD tunneled catheter placed on 05/12/2020.     - Nephrology onboard. HD for uremia.     - His uremic symptoms are clinically improved his nausea is improved as well as his appetite.     - appreciate nephro assistance  Obstructive uropathy/BPH Hematuria     - gross hematuria this AM     - have consulted urology; appreciate their assistance  Syncope     - episode while having BM this AM; likely vasovagal     - check orthostatics: tachy throughout test, but BP ok. Will get small amount of fluids.      - repeat H&H is stable  Hypokalemia Hypomagnesemia Hypocalcemia Secondary hyperphosphatemia     - management per nephrho/HD.  Anemia of chronic renal disease vomiting: Fe-deficiency     - Fe mildly low, s/p IV Fe supplement and 2 units pRBCs  Leukocytosis:     - likely reactive; WBC improving  Elevated lipase     - Likely due to nausea and vomiting he has no pancreatitis.     -  Tolerating his diet well.     - follow up lipase in AM  DVT prophylaxis: SCDs Code Status: FULL Family Communication: None at bedside.   Status is: Inpatient  Remains inpatient appropriate because:Inpatient level of care appropriate due to severity of illness   Dispo: The patient is from: Home              Anticipated d/c is to: Home              Anticipated d/c date is: 2 days              Patient currently is not medically stable to d/c.  Consultants:   Nephrology  Urology  IR  Procedures:   HD cath placement   ROS:  Denies CP, N, V, ab pain. Remainder ROS is negative for all not previously mentioned.  Subjective: "I feel fine. I'm ready to work with them."  Objective: Vitals:   05/16/20 0823 05/16/20 0840 05/16/20 1030 05/16/20 1212  BP: 132/81 115/87 116/68 130/89  Pulse: (!) 128 (!) 110 (!) 114 (!) 105  Resp: 18   18  Temp: 98 F (36.7 C)   98.7 F (37.1 C)  TempSrc: Oral   Oral  SpO2: 98% 98% 100% 100%  Weight:      Height:  Intake/Output Summary (Last 24 hours) at 05/16/2020 1312 Last data filed at 05/16/2020 1216 Gross per 24 hour  Intake 895 ml  Output 1025 ml  Net -130 ml   Filed Weights   05/15/20 1958 05/15/20 2306 05/16/20 0025  Weight: 100.1 kg 100.1 kg 97.1 kg    Examination:  General: 69 y.o. male resting in bed in NAD Cardiovascular: tachy, +S1, S2, no m/g/r, equal pulses throughout Respiratory: CTABL, no w/r/r, normal WOB GI: BS+, NDNT, no masses noted, no organomegaly noted MSK: No e/c/c Neuro: A&O x 3, no focal deficits Psyc: Appropriate interaction and affect, calm/cooperative   Data Reviewed: I have personally reviewed following labs and imaging studies.  CBC: Recent Labs  Lab 05/11/20 1459 05/12/20 0502 05/15/20 0640 05/16/20 0550 05/16/20 1136  WBC 13.6* 16.2* 12.2* 10.8*  --   HGB 9.8* 9.5* 5.9* 7.3* 7.9*  HCT 29.5* 28.4* 19.5* 23.3* 24.8*  MCV 81.5 82.8 88.6 87.9  --   PLT 428* 354 243 192  --    Basic  Metabolic Panel: Recent Labs  Lab 05/11/20 1459 05/12/20 0035 05/12/20 1042 05/12/20 1042 05/13/20 0730 05/13/20 1455 05/14/20 0650 05/15/20 0640 05/16/20 0550 05/16/20 1136  NA 139  137   < > 137   < > 138 136 136 133* 136  --   K 2.7*  2.6*   < > 3.7   < > 3.7 3.8 3.7 3.5 3.4*  --   CL 88*  85*   < > 93*   < > 98 100 100 100 100  --   CO2 22  23   < > 23   < > 23 23 24 22 26   --   GLUCOSE 138*  140*   < > 107*   < > 104* 121* 99 91 96  --   BUN 96*  127*   < > 126*   < > 114* 75* 70* 70* 34*  --   CREATININE 18.64*  18.17*   < > 16.69*   < > 15.60* 10.85* 10.84* 10.34* 6.28*  --   CALCIUM 6.4*  6.4*   < > 6.7*   < > 6.9* 7.2* 7.0* 7.1* 7.1*  --   MG 1.5*  --   --   --   --   --   --   --   --  1.7  PHOS 5.4*  5.2*   < > 6.5*  --   --  4.2 4.4 4.8* 3.0  --    < > = values in this interval not displayed.   GFR: Estimated Creatinine Clearance: 13.8 mL/min (A) (by C-G formula based on SCr of 6.28 mg/dL (H)). Liver Function Tests: Recent Labs  Lab 05/11/20 1459 05/12/20 0035 05/12/20 0502 05/12/20 1042 05/13/20 0730 05/13/20 1455 05/14/20 0650 05/15/20 0640 05/16/20 0550  AST 12*  --  11*  --  10*  --   --   --   --   ALT 9  --  6  --  6  --   --   --   --   ALKPHOS 77  --  68  --  62  --   --   --   --   BILITOT 0.6  --  0.6  --  1.0  --   --   --   --   PROT 7.6  --  6.1*  --  5.9*  --   --   --   --  ALBUMIN 4.3  4.1   < > 3.4*   < > 3.0* 2.9* 2.8* 2.9* 2.7*   < > = values in this interval not displayed.   Recent Labs  Lab 05/11/20 1459  LIPASE 190*   No results for input(s): AMMONIA in the last 168 hours. Coagulation Profile: Recent Labs  Lab 05/12/20 0502  INR 1.1   Cardiac Enzymes: No results for input(s): CKTOTAL, CKMB, CKMBINDEX, TROPONINI in the last 168 hours. BNP (last 3 results) No results for input(s): PROBNP in the last 8760 hours. HbA1C: No results for input(s): HGBA1C in the last 72 hours. CBG: No results for input(s): GLUCAP  in the last 168 hours. Lipid Profile: No results for input(s): CHOL, HDL, LDLCALC, TRIG, CHOLHDL, LDLDIRECT in the last 72 hours. Thyroid Function Tests: No results for input(s): TSH, T4TOTAL, FREET4, T3FREE, THYROIDAB in the last 72 hours. Anemia Panel: Recent Labs    05/13/20 1318  FERRITIN 235  TIBC 169*  IRON 44*   Sepsis Labs: No results for input(s): PROCALCITON, LATICACIDVEN in the last 168 hours.  Recent Results (from the past 240 hour(s))  SARS Coronavirus 2 by RT PCR (hospital order, performed in [facility] hospital lab) Nasopharyngeal Nasopharyngeal Swab     Status: None   Collection Time: 05/11/20  5:41 PM   Specimen: Nasopharyngeal Swab  Result Value Ref Range Status   SARS Coronavirus 2 NEGATIVE NEGATIVE Final    Comment: (NOTE) SARS-CoV-2 target nucleic acids are NOT DETECTED.  The SARS-CoV-2 RNA is generally detectable in upper and lower respiratory specimens during the acute phase of infection. The lowest concentration of SARS-CoV-2 viral copies this assay can detect is 250 copies / mL. A negative result does not preclude SARS-CoV-2 infection and should not be used as the sole basis for treatment or other patient management decisions.  A negative result may occur with improper specimen collection / handling, submission of specimen other than nasopharyngeal swab, presence of viral mutation(s) within the areas targeted by this assay, and inadequate number of viral copies (<250 copies / mL). A negative result must be combined with clinical observations, patient history, and epidemiological information.  Fact Sheet for Patients:   StrictlyIdeas.no  Fact Sheet for Healthcare Providers: BankingDealers.co.za  This test is not yet approved or  cleared by the [place] FDA and has been authorized for detection and/or diagnosis of SARS-CoV-2 by FDA under an Emergency Use Authorization (EUA).  This EUA will  remain in effect (meaning this test can be used) for the duration of the COVID-19 declaration under Section 564(b)(1) of the Act, 21 U.S.C. section 360bbb-3(b)(1), unless the authorization is terminated or revoked sooner.  Performed at [facility], [place] [address], [place], [place] [address]   Culture, Urine     Status: None   Collection Time: 05/11/20  6:05 PM   Specimen: Urine, Clean Catch  Result Value Ref Range Status   Specimen Description   Final    URINE, CLEAN CATCH Performed at [facility], [place] [address], [place], [place] [address]    Special Requests   Final    NONE Performed at [facility], [place] [address] [place], [place] [address]    Culture   Final    NO GROWTH Performed at [facility], [place] [address], [place], [place] [address]    Report Status 05/13/2020 FINAL  Final      Radiology Studies: No results found.   Scheduled Meds: . sodium  chloride   Intravenous Once  . calcium carbonate  800 mg of elemental calcium Oral TID  . Chlorhexidine Gluconate Cloth  6 each Topical Q0600  . Chlorhexidine Gluconate Cloth  6 each Topical Q0600  . darbepoetin (ARANESP) injection - NON-DIALYSIS  150 mcg Subcutaneous Q Wed-1800  . docusate sodium  100 mg Oral BID  . feeding supplement (NEPRO CARB STEADY)  237 mL Oral BID BM  . tamsulosin  0.8 mg Oral QPC supper   Continuous Infusions:   LOS: 5 days    Time spent: 35 minutes spent in the coordination of care today.   Jonnie Finner, DO [facility]  If 7PM-7AM, please contact night-coverage [email or web address] 05/16/2020, 1:12 PM

## 2020-05-16 NOTE — Consult Note (Signed)
Urology Consult   Physician requesting consult: Dr. 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  He was found to be in acute renal failure with a creatinine over 17.  CT imaging revealed an extremely distended bladder with bilateral hydroureteronephrosis.  A Foley catheter was placed and he was started on dialysis.  He subsequently developed gross hematuria.  He was also noted to be anemic with a hemoglobin under 6 prompting a blood transfusion with appropriate response.  He states that he did have difficulty urinating that has been chronic.  He typically would void with Crede maneuver and did not necessarily feel that he was emptying to completion.  He also began having incontinence at night recently.  There is no family history of prostate cancer.  He denies a history of voiding or storage urinary symptoms, hematuria, UTIs, STDs, urolithiasis, GU malignancy/trauma/surgery.  Past Medical History:  Diagnosis Date  . AKI (acute kidney injury) ([place]) 05/2020  . Depression   . Glaucoma   . Hypertension     Past Surgical History:  Procedure Laterality Date  . IR FLUORO GUIDE CV LINE RIGHT  05/12/2020  . IR US GUIDE VASC ACCESS RIGHT  05/12/2020    Medications:  Home meds:  No current facility-administered medications on file prior to encounter.   Current Outpatient Medications on File Prior to Encounter  Medication Sig Dispense Refill  . divalproex (DEPAKOTE) 125 MG DR tablet Take 125 mg by mouth 2 (two) times daily.    Marland Kitchen latanoprost (XALATAN) 0.005 % ophthalmic solution Place 1 drop into both eyes at bedtime.     Marland Kitchen QUEtiapine (SEROQUEL) 200 MG tablet Take 200 mg by mouth at bedtime.        Scheduled Meds: . sodium chloride   Intravenous Once  . calcium carbonate  800 mg of elemental calcium Oral TID  .  Chlorhexidine Gluconate Cloth  6 each Topical Q0600  . Chlorhexidine Gluconate Cloth  6 each Topical Q0600  . darbepoetin (ARANESP) injection - NON-DIALYSIS  150 mcg Subcutaneous Q Wed-1800  . docusate sodium  100 mg Oral BID  . feeding supplement (NEPRO CARB STEADY)  237 mL Oral BID BM  . tamsulosin  0.8 mg Oral QPC supper   Continuous Infusions: . sodium chloride 100 mL/hr at 05/16/20 1428   PRN Meds:.acetaminophen **OR** acetaminophen, ondansetron **OR** ondansetron (ZOFRAN) IV  Allergies:  Allergies  Allergen Reactions  . Penicillins     Family History  Family history unknown: Yes    Social History:  reports that he has never smoked. He has never used smokeless tobacco. He reports that he does not drink alcohol and does not use drugs.  ROS: A complete review of systems was performed.  All systems are negative except for pertinent findings as noted.  Physical Exam:  Vital signs in last 24 hours: Temp:  [98 F (36.7 C)-99.2 F (37.3 C)] 98.7 F (37.1 C) (08/04 1212) Pulse Rate:  [94-128] 109 (08/04 1528) Resp:  [14-27] 16 (08/04 1528) BP: (108-150)/(56-91) 116/77 (08/04 1528) SpO2:  [97 %-100 %] 100 % (08/04 1528) Weight:  [97.1 kg-100.1 kg] 97.1 kg (08/04 0025) Constitutional:  Alert and oriented, No acute distress Cardiovascular: Regular rate and rhythm, No JVD Respiratory: Normal respiratory effort GI: Abdomen is soft, nontender, nondistended, no abdominal masses Genitourinary: No CVAT. Normal male phallus, testes  are descended bilaterally and non-tender and without masses, scrotum is normal in appearance without lesions or masses, perineum is normal on inspection.  He has an indwelling 16 French catheter with grossly bloody urine. Rectal: Normal sphincter tone, no rectal masses, prostate is non tender and without nodularity. Prostate size is estimated to be 60 cc. Lymphatic: No lymphadenopathy Neurologic: Grossly intact, no focal deficits Psychiatric: Normal mood  and affect  Laboratory Data:  Recent Labs    05/15/20 0640 05/16/20 0550 05/16/20 1136  WBC 12.2* 10.8*  --   HGB 5.9* 7.3* 7.9*  HCT 19.5* 23.3* 24.8*  PLT 243 192  --     Recent Labs    05/14/20 0650 05/15/20 0640 05/16/20 0550  NA 136 133* 136  K 3.7 3.5 3.4*  CL 100 100 100  GLUCOSE 99 91 96  BUN 70* 70* 34*  CALCIUM 7.0* 7.1* 7.1*  CREATININE 10.84* 10.34* 6.28*     Results for orders placed or performed during the hospital encounter of 05/11/20 (from the past 24 hour(s))  Renal function panel     Status: Abnormal   Collection Time: 05/16/20  5:50 AM  Result Value Ref Range   Sodium 136 135 - 145 mmol/L   Potassium 3.4 (L) 3.5 - 5.1 mmol/L   Chloride 100 98 - 111 mmol/L   CO2 26 22 - 32 mmol/L   Glucose, Bld 96 70 - 99 mg/dL   BUN 34 (H) 8 - 23 mg/dL   Creatinine, Ser 6.28 (H) 0.61 - 1.24 mg/dL   Calcium 7.1 (L) 8.9 - 10.3 mg/dL   Phosphorus 3.0 2.5 - 4.6 mg/dL   Albumin 2.7 (L) 3.5 - 5.0 g/dL   GFR calc non Af Amer 8 (L) >60 mL/min   GFR calc Af Amer 10 (L) >60 mL/min   Anion gap 10 5 - 15  CBC     Status: Abnormal   Collection Time: 05/16/20  5:50 AM  Result Value Ref Range   WBC 10.8 (H) 4.0 - 10.5 K/uL   RBC 2.65 (L) 4.22 - 5.81 MIL/uL   Hemoglobin 7.3 (L) 13.0 - 17.0 g/dL   HCT 23.3 (L) 39 - 52 %   MCV 87.9 80.0 - 100.0 fL   MCH 27.5 26.0 - 34.0 pg   MCHC 31.3 30.0 - 36.0 g/dL   RDW 13.3 11.5 - 15.5 %   Platelets 192 150 - 400 K/uL   nRBC 0.0 0.0 - 0.2 %  Hemoglobin and hematocrit, blood     Status: Abnormal   Collection Time: 05/16/20 11:36 AM  Result Value Ref Range   Hemoglobin 7.9 (L) 13.0 - 17.0 g/dL   HCT 24.8 (L) 39 - 52 %  Magnesium     Status: None   Collection Time: 05/16/20 11:36 AM  Result Value Ref Range   Magnesium 1.7 1.7 - 2.4 mg/dL   Recent Results (from the past 240 hour(s))  SARS Coronavirus 2 by RT PCR (hospital order, performed in [place] hospital lab) Nasopharyngeal Nasopharyngeal Swab     Status: None    Collection Time: 05/11/20  5:41 PM   Specimen: Nasopharyngeal Swab  Result Value Ref Range Status   SARS Coronavirus 2 NEGATIVE NEGATIVE Final    Comment: (NOTE) SARS-CoV-2 target nucleic acids are NOT DETECTED.  The SARS-CoV-2 RNA is generally detectable in upper and lower respiratory specimens during the acute phase of infection. The lowest concentration of SARS-CoV-2 viral copies this assay can detect is 250 copies /  mL. A negative result does not preclude SARS-CoV-2 infection and should not be used as the sole basis for treatment or other patient management decisions.  A negative result may occur with improper specimen collection / handling, submission of specimen other than nasopharyngeal swab, presence of viral mutation(s) within the areas targeted by this assay, and inadequate number of viral copies (<250 copies / mL). A negative result must be combined with clinical observations, patient history, and epidemiological information.  Fact Sheet for Patients:   StrictlyIdeas.no  Fact Sheet for Healthcare Providers: BankingDealers.co.za  This test is not yet approved or  cleared by the [place] FDA and has been authorized for detection and/or diagnosis of SARS-CoV-2 by FDA under an Emergency Use Authorization (EUA).  This EUA will remain in effect (meaning this test can be used) for the duration of the COVID-19 declaration under Section 564(b)(1) of the Act, 21 U.S.C. section 360bbb-3(b)(1), unless the authorization is terminated or revoked sooner.  Performed at [facility], [place] [address] [place], [place] [address]   Culture, Urine     Status: None   Collection Time: 05/11/20  6:05 PM   Specimen: Urine, Clean Catch  Result Value Ref Range Status   Specimen Description   Final    URINE, CLEAN CATCH Performed at [facility], [place] [address] [place], [place] [address]    Special  Requests   Final    NONE Performed at [facility], [place] [address], [place], [place] [address]    Culture   Final    NO GROWTH Performed at [facility], [place] [address], [place], [place] [address]    Report Status 05/13/2020 FINAL  Final    Renal Function: Recent Labs    05/12/20 0502 05/12/20 1042 05/13/20 0730 05/13/20 1455 05/14/20 0650 05/15/20 0640 05/16/20 0550  CREATININE 17.02* 16.69* 15.60* 10.85* 10.84* 10.34* 6.28*   Estimated Creatinine Clearance: 13.8 mL/min (A) (by C-G formula based on SCr of 6.28 mg/dL (H)).   PSA: 28.6  Procedure: I hand irrigated his bladder and removed a moderate amount of clot until his catheter was draining well.  It is now draining pink.  Radiologic Imaging: Narrative & Impression  CLINICAL DATA:  Acute renal failure. Nausea and vomiting. Diarrhea for 2 days. Dysuria.  EXAM: CT ABDOMEN AND PELVIS WITHOUT CONTRAST  TECHNIQUE: Multidetector CT imaging of the abdomen and pelvis was performed following the standard protocol without IV contrast.  COMPARISON:  None.  FINDINGS: Lower chest: 5 mm pulmonary lymph node in the right lower lobe, series 4, image 2 is likely a perifissural intrapulmonary lymph node but nonspecific. Heart is normal in size. No pleural effusion.  Hepatobiliary: No focal liver abnormality is seen. No gallstones, gallbladder wall thickening, or biliary dilatation.  Pancreas: No ductal dilatation or inflammation. Single calcification in the uncinate process of the pancreas.  Spleen: Normal in size without focal abnormality.  Adrenals/Urinary Tract: Bilateral adrenal thickening without dominant nodule. There is bilateral hydroureteronephrosis. Both ureters are dilated to bladder insertion. Bilateral perinephric edema that is symmetric. There is thinning of the bilateral renal parenchyma. Cortical cysts in the left kidney.  There is marked urinary bladder distention with  bladder spanning 23 cm cranial caudal extending into the right aspect of the abdomen. Bladder wall appears trabeculated inferiorly with probable multiple small diverticula. No definite bladder wall thickening or obvious focal bladder lesion. There is mass effect on the bladder base from prostate gland.  Stomach/Bowel: Fluid  levels in the stomach without evidence of gastric wall thickening. No abnormal gastric distension. Short of the small bowel is decompressed without obstruction, inflammation, or abnormal distention. Colonic diverticulosis throughout the entire colon, including the cecum. No diverticulitis. No colonic wall thickening or inflammatory change. Normal appendix.  Vascular/Lymphatic: Aortic atherosclerosis. No aortic aneurysm. No bulky abdominopelvic adenopathy.  Reproductive: Enlarged prostate gland spanning 5.5 x 5.3 x 5.4 cm (volume = 82 cm^3).  Other: Fat in both inguinal canals. Tiny fat containing umbilical hernia. No ascites or free air.  Musculoskeletal: Degenerative change throughout the spine. There are no acute or suspicious osseous abnormalities.  IMPRESSION: 1. Marked urinary bladder distention with bladder wall trabeculation and probable multiple small diverticula. Bilateral hydroureteronephrosis, likely due to distended bladder. Suspect chronic bladder outlet obstruction. 2. Enlarged prostate gland causing mass effect on the bladder base. 3. Colonic diverticulosis without diverticulitis. 4. A 5 mm pulmonary lymph node in the right lower lobe is likely a perifissural intrapulmonary lymph node but nonspecific. Consider follow-up chest CT 1 year if patient is high-risk.  Aortic Atherosclerosis (ICD10-I70.0).   Electronically Signed   By: Keith Rake M.D.   On: 05/11/2020 17:59     Impression/Recommendation 1.  Urinary retention secondary to bladder outlet obstruction: Continue Foley catheter drainage and monitor his renal function  and for postobstructive diuresis.  Will further assess definitive plan as an outpatient.  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  2.  Hematuria: This is likely secondary to decompression of his very distended bladder.  His catheter is currently draining well enough to continue to monitor.  If he does develop clot obstruction of his catheter, this may need to be exchanged for a hematuria catheter.  We will consider further evaluation as an outpatient if this persists.  3.  Elevated PSA: This is not a useful test in the setting of recent catheterization and urinary retention and is likely to be falsely elevated.  His digital rectal exam is not concerning for prostate cancer.  This can be rechecked in the future as an outpatient.  Dutch Gray 05/16/2020, 4:39 PM    Pryor Curia MD  CC: Dr. Cherylann Ratel

## 2020-05-16 NOTE — Progress Notes (Signed)
Physical Therapy Treatment Patient Details Name: Manuel Ortega MRN: [id number] DOB: [birth year] Today's Date: 05/16/2020    History of Present Illness 69 y.o. male with medical history significant of major depression and hypertension,  not taking any medication,  has no primary care physician, he has no medical care in the last 5 years presenting in the [facility] emergency department with nausea and vomiting for past 4 days.  He also report having decreased appetite,  intermittent cough but denies any weight loss.  He denies any history of kidney disease.    PT Comments    Pt admitted with above diagnosis. Pt was able to get OOB and was doing well initialy.  Walked to door and asked to sit on 3N1.  Once on 3N1 had unresponsive episode (see below for further details). Nurse made aware and pt placed back in bed.   Pt currently with functional limitations due to balance and endurance deficits as well as dizziness. Pt will benefit from skilled PT to increase their independence and safety with mobility to allow discharge to the venue listed below.     Follow Up Recommendations  Home health PT     Equipment Recommendations  3in1 (PT) (further needs TBD)    Recommendations for Other Services Other (comment) (social work consult (mentions his depression multiple times))     Precautions / Restrictions Precautions Precautions: Fall Restrictions Weight Bearing Restrictions: No    Mobility  Bed Mobility Overal bed mobility: Needs Assistance Bed Mobility: Supine to Sit     Supine to sit: Supervision        Transfers Overall transfer level: Needs assistance Equipment used: None Transfers: Sit to/from Stand Sit to Stand: Min guard         General transfer comment: No physical assist but guard assist due to slight dizziness per pt  Ambulation/Gait Ambulation/Gait assistance: Min guard Gait Distance (Feet): 15 Feet Assistive device: None Gait Pattern/deviations: Step-to pattern Gait  velocity: reduced Gait velocity interpretation: <1.31 ft/sec, indicative of household ambulator General Gait Details: pt with short step to gait, no significant balance deviations noted.  Pt ambulated to door and then back to recliner and asked to get onto 3N1 as he thought he may have a BM.  Once on 3N1, pt states his dizziness has increased and PT notes that pt is unresponsive and eyes with blank stare and rolling back in head. Called nurse call bell for help and immediately lifted pts LEs and tryiing to arouse pt.  Took at least a min and 1/2 to get someone in room and pt had come to by this time.  BP was stable at 132/81 and pt talking and does not recall what happened.  Staff assisted PT in moving 3N1 beside bed.  Pt stood with min guard assist and pivoted back to bed with nursing cleaning pts bottom as he did have BM.  Nurse contacted MD.    Stairs             Wheelchair Mobility    Modified Rankin (Stroke Patients Only)       Balance Overall balance assessment: Needs assistance Sitting-balance support: No upper extremity supported;Feet supported Sitting balance-Leahy Scale: Fair     Standing balance support: No upper extremity supported Standing balance-Leahy Scale: Fair Standing balance comment: Static stance without difficulty. Dynamic gait with min guard assist for steadying due to dizziness.  Cognition Arousal/Alertness: Awake/alert Behavior During Therapy: WFL for tasks assessed/performed Overall Cognitive Status: Impaired/Different from baseline Area of Impairment: Orientation;Memory;Following commands;Safety/judgement;Awareness;Problem solving                 Orientation Level: Place     Following Commands: Follows one step commands consistently Safety/Judgement: Decreased awareness of deficits Awareness: Emergent Problem Solving: Requires verbal cues        Exercises      General Comments         Pertinent Vitals/Pain Pain Assessment: No/denies pain    Home Living                      Prior Function            PT Goals (current goals can now be found in the care plan section) Acute Rehab PT Goals Patient Stated Goal: To improve mobility and find more assistance Progress towards PT goals: Not progressing toward goals - comment (unresponsive episode limiting pts progression)    Frequency    Min 3X/week      PT Plan Current plan remains appropriate    Co-evaluation              AM-PAC PT "6 Clicks" Mobility   Outcome Measure  Help needed turning from your back to your side while in a flat bed without using bedrails?: None Help needed moving from lying on your back to sitting on the side of a flat bed without using bedrails?: None Help needed moving to and from a bed to a chair (including a wheelchair)?: A Little Help needed standing up from a chair using your arms (e.g., wheelchair or bedside chair)?: A Little Help needed to walk in hospital room?: A Little Help needed climbing 3-5 steps with a railing? : A Lot 6 Click Score: 19    End of Session Equipment Utilized During Treatment: Gait belt Activity Tolerance: Treatment limited secondary to medical complications (Comment) (pt had unresponsive episode on 3N1) Patient left: with call bell/phone within reach;in bed;with bed alarm set Nurse Communication: Mobility status PT Visit Diagnosis: Unsteadiness on feet (R26.81);Other abnormalities of gait and mobility (R26.89);Muscle weakness (generalized) (M62.81);Other symptoms and signs involving the nervous system (R29.898)     Time: 7654-6503 PT Time Calculation (min) (ACUTE ONLY): 30 min  Charges:  $Gait Training: 8-22 mins $Therapeutic Activity: 8-22 mins                     Geron Mulford W,PT Acute Rehabilitation Services Pager:  [phone]  Office:  [place] 05/16/2020, 9:32 AM

## 2020-05-17 LAB — RENAL FUNCTION PANEL
Albumin: 2.7 g/dL — ABNORMAL LOW (ref 3.5–5.0)
Anion gap: 13 (ref 5–15)
BUN: 38 mg/dL — ABNORMAL HIGH (ref 8–23)
CO2: 23 mmol/L (ref 22–32)
Calcium: 7.2 mg/dL — ABNORMAL LOW (ref 8.9–10.3)
Chloride: 99 mmol/L (ref 98–111)
Creatinine, Ser: 6.67 mg/dL — ABNORMAL HIGH (ref 0.61–1.24)
GFR calc Af Amer: 9 mL/min — ABNORMAL LOW (ref >60)
GFR calc non Af Amer: 8 mL/min — ABNORMAL LOW (ref >60)
Glucose, Bld: 83 mg/dL (ref 70–99)
Phosphorus: 2.8 mg/dL (ref 2.5–4.6)
Potassium: 3.2 mmol/L — ABNORMAL LOW (ref 3.5–5.1)
Sodium: 135 mmol/L (ref 135–145)

## 2020-05-17 LAB — CBC
HCT: 27.1 % — ABNORMAL LOW (ref 39.0–52.0)
Hemoglobin: 8.6 g/dL — ABNORMAL LOW (ref 13.0–17.0)
MCH: 27.7 pg (ref 26.0–34.0)
MCHC: 31.7 g/dL (ref 30.0–36.0)
MCV: 87.4 fL (ref 80.0–100.0)
Platelets: 178 10*3/uL (ref 150–400)
RBC: 3.1 MIL/uL — ABNORMAL LOW (ref 4.22–5.81)
RDW: 14 % (ref 11.5–15.5)
WBC: 13.1 10*3/uL — ABNORMAL HIGH (ref 4.0–10.5)
nRBC: 0.2 % (ref 0.0–0.2)

## 2020-05-17 LAB — CBC WITH DIFFERENTIAL/PLATELET
Abs Immature Granulocytes: 0.15 10*3/uL — ABNORMAL HIGH (ref 0.00–0.07)
Basophils Absolute: 0 10*3/uL (ref 0.0–0.1)
Basophils Relative: 0 %
Eosinophils Absolute: 0.3 10*3/uL (ref 0.0–0.5)
Eosinophils Relative: 2 %
HCT: 19 % — ABNORMAL LOW (ref 39.0–52.0)
Hemoglobin: 6.1 g/dL — CL (ref 13.0–17.0)
Immature Granulocytes: 1 %
Lymphocytes Relative: 29 %
Lymphs Abs: 3 10*3/uL (ref 0.7–4.0)
MCH: 28.4 pg (ref 26.0–34.0)
MCHC: 32.1 g/dL (ref 30.0–36.0)
MCV: 88.4 fL (ref 80.0–100.0)
Monocytes Absolute: 0.8 10*3/uL (ref 0.1–1.0)
Monocytes Relative: 8 %
Neutro Abs: 6.2 10*3/uL (ref 1.7–7.7)
Neutrophils Relative %: 60 %
Platelets: 167 10*3/uL (ref 150–400)
RBC: 2.15 MIL/uL — ABNORMAL LOW (ref 4.22–5.81)
RDW: 13.2 % (ref 11.5–15.5)
WBC: 10.4 10*3/uL (ref 4.0–10.5)
nRBC: 0 % (ref 0.0–0.2)

## 2020-05-17 LAB — PREPARE RBC (CROSSMATCH)

## 2020-05-17 LAB — MAGNESIUM: Magnesium: 1.6 mg/dL — ABNORMAL LOW (ref 1.7–2.4)

## 2020-05-17 MED ORDER — ACETAMINOPHEN 325 MG PO TABS
650.0000 mg | ORAL_TABLET | Freq: Once | ORAL | Status: AC
  Administered 2020-05-17: 650 mg via ORAL
  Filled 2020-05-17: qty 2

## 2020-05-17 MED ORDER — RENA-VITE PO TABS
1.0000 | ORAL_TABLET | Freq: Every day | ORAL | Status: DC
  Administered 2020-05-17 – 2020-05-31 (×15): 1 via ORAL
  Filled 2020-05-17 (×15): qty 1

## 2020-05-17 MED ORDER — POTASSIUM CHLORIDE CRYS ER 20 MEQ PO TBCR
40.0000 meq | EXTENDED_RELEASE_TABLET | Freq: Every day | ORAL | Status: AC
  Administered 2020-05-17 – 2020-05-18 (×2): 40 meq via ORAL
  Filled 2020-05-17 (×2): qty 2

## 2020-05-17 MED ORDER — SODIUM CHLORIDE 0.9% IV SOLUTION: Freq: Once | INTRAVENOUS | Status: AC

## 2020-05-17 MED ORDER — MAGNESIUM OXIDE 400 (241.3 MG) MG PO TABS
400.0000 mg | ORAL_TABLET | Freq: Every day | ORAL | Status: DC
  Administered 2020-05-17 – 2020-06-01 (×16): 400 mg via ORAL
  Filled 2020-05-17 (×16): qty 1

## 2020-05-17 NOTE — Progress Notes (Signed)
PT Cancellation Note  Patient Details Name: Manuel Ortega MRN: [id number] DOB: Feb 21, 1951   Cancelled Treatment:    Reason Eval/Treat Not Completed: Medical issues which prohibited therapy. Pt's Hgb down to 6.1. Will check back as time allows and when levels have improved.    Allena Katz 05/17/2020, 9:15 AM

## 2020-05-17 NOTE — Progress Notes (Signed)
Initial Nutrition Assessment  DOCUMENTATION CODES:   Not applicable  INTERVENTION:   -Continue Nepro Shake po BID, each supplement provides 425 kcal and 19 grams protein -Renal MVI daily  NUTRITION DIAGNOSIS:   Increased nutrient needs related to acute illness (AKI) as evidenced by estimated needs.  GOAL:   Patient will meet greater than or equal to 90% of their needs  MONITOR:   PO intake, Supplement acceptance, Labs, Weight trends, Skin, I & O's  REASON FOR ASSESSMENT:   Malnutrition Screening Tool    ASSESSMENT:   Manuel Ortega is a 69 y.o. male with medical history significant of major depression and hypertension,  not taking any medication,  has no primary care physician, he has no medical care in the last 5 years presenting in the [facility] emergency department with nausea and vomiting for past 4 days  Pt admitted with AKI.   7/31- s/p non tunneled HD catheter placement 8/1- first HD treatment  Reviewed I/O's: -1.6 L x 24 hours and +430 ml since admission  UOP: 1.8 L x 24 hours  Per nephrology notes, no HD needs today; continuing to determine length of therapy.   Spoke with pt at bedside, who was pleasant and good spirits today. He reports feeling "great" since being admitted to the hospital. Pt reports that he started to lose sense of taste and smell approximately 3 weeks ago, which he thought was a side effect related to his COVID vaccine. Pt reports he would still try to eat, however, this would become unbearable due to not having sense to enjoys food ("it felt like a mouthful of rubber and clay"). Pt reports he would try to consume fluids and protein shakes in attempt to stay hydrated and supplement intake. Prior to acute illness, pt reports consuming 2-3 meals per day- breakfast of cereal or eggs and toast and lunch and dinner of soups and salads, although he enjoys a wide variety foods and snacked on fresh fruits and vegetables.  Since being admitted, he has  regained his sense of taste and smell. He reports having no problems with meals and has been eating most of his meals (meal completion 90-100%). He enjoys his meals and the food selections despite being on a renal diet. He also enjoys his Nepro shakes, which he would like to continue.   Pt denies any weight loss. He reports UBW is around 200#.   Discussed importance of good meal and supplement intake to promote healing. He had no further questions, but appreciative of RD visit.   Medications reviewed and include calcium carbonate, aranesp, and colace.   Labs reviewed: K: 3.4.   NUTRITION - FOCUSED PHYSICAL EXAM:    Most Recent Value  Orbital Region No depletion  Upper Arm Region No depletion  Buccal Region No depletion  Temple Region No depletion  Clavicle Bone Region No depletion  Clavicle and Acromion Bone Region No depletion  Scapular Bone Region No depletion  Dorsal Hand No depletion  Patellar Region No depletion  Anterior Thigh Region No depletion  Posterior Calf Region No depletion  Edema (RD Assessment) Mild  Hair Reviewed  Eyes Reviewed  Mouth Reviewed  Skin Reviewed  Nails Reviewed       Diet Order:   Diet Order            Diet renal with fluid restriction Fluid restriction: 1200 mL Fluid; Room service appropriate? Yes; Fluid consistency: Thin  Diet effective now  EDUCATION NEEDS:   No education needs have been identified at this time  Skin:  Skin Assessment: Reviewed RN Assessment  Last BM:  05/16/20  Height:   Ht Readings from Last 1 Encounters:  05/12/20 6\' 1"  (1.854 m)    Weight:   Wt Readings from Last 1 Encounters:  05/17/20 98.6 kg    Ideal Body Weight:  83.6 kg  BMI:  Body mass index is 28.68 kg/m.  Estimated Nutritional Needs:   Kcal:  5476-8915  Protein:  110-125 grams  Fluid:  1000 ml + UOP    Loistine Chance, RD, LDN, [place] Registered Dietitian II Certified Diabetes Care and Education Specialist Please refer  to [facility] for RD and/or RD on-call/weekend/after hours pager

## 2020-05-17 NOTE — Progress Notes (Signed)
This note also relates to the following rows which could not be included: ECG Heart Rate - Cannot attach notes to unvalidated device data Resp - Cannot attach notes to unvalidated device data    05/17/20 6190  Provider Notification  Provider Name/Title Dr. Tonie Griffith  Date Provider Notified 05/17/20  Time Provider Notified 0630  Notification Type Page  Notification Reason Other (Comment) (hgb=6.1)  Response See new orders  Date of Provider Response 05/17/20  Time of Provider Response [phone]

## 2020-05-17 NOTE — Progress Notes (Signed)
PROGRESS NOTE    Manuel Ortega  FKC:[id number] DOB: March 23, 1951 DOA: 05/11/2020 PCP: System, Pcp Not In   Brief Narrative:   Manuel Ortega an 69 y.o.malepast medical history significant of depression and hypertension noncompliant with his medication with no primary care doctor for over the last 5 years presented to the [facility] long hospital with nausea vomiting that started 4 days prior to admission. In the ED he was found to have a white count of 13 BUN of 127 creatinine of 18 potassium was 2.6 anion gap of 29 and a bicarb of 23. 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 Nephrology was consulted, as well as IR for tunneled HD catheter.  8/5: Hgb down this morning. Source of bleed still believed to be bladder as his urine is still grossly bloody. Appreciate urology and nephrology assistance.   Assessment & Plan: Acute kidney injury due to obstructive uropathy Uremia N/V     - Nausea vomiting likely due to uremic symptoms, now resolved.     - IR was consulted and HD tunneled catheter placed on 05/12/2020.     - Nephrology onboard. HD for uremia.     - His uremic symptoms are clinically improved his nausea is improved as well as his appetite.     - appreciate nephro assistance  Obstructive uropathy/BPH Hematuria Urinary retention     - appreciate urology assistance     - continues to have gross hematuria; continue foley, urology to follow up outpt     - urology to follow up retention outpt.  Syncope     - episode while having BM this AM; likely vasovagal     - check orthostatics: tachy throughout test, but BP ok. Will get small amount of fluids.      - 8/5: orthostatics ok  Hypokalemia Hypomagnesemia Hypocalcemia Secondary hyperphosphatemia     - replace, monitor  Anemia of chronic renal disease vomiting: Fe-deficiency Acute blood loss anemia?     - Fe mildly low, s/p IV Fe supplement and 2 units pRBCs     - 8/5: Hgb back down to 6.1  this AM (likely d/t hematuria); transfuse 2 units pRBCs  Leukocytosis:     - likely reactive; white count has now normalized  Elevated lipase     - Likely due to nausea and vomiting he has no pancreatitis.     - Tolerating his diet well.  DVT prophylaxis: SCDs Code Status: FULL Family Communication: None at bedside.   Status is: Inpatient  Remains inpatient appropriate because:Inpatient level of care appropriate due to severity of illness   Dispo: The patient is from: Home              Anticipated d/c is to: Home              Anticipated d/c date is: 3 days              Patient currently is not medically stable to d/c.  Consultants:   Nephrology  Urology  ROS:  Denies CP, N, V, ab pain. Remainder ROS is negative for all not previously mentioned.  Subjective: "Yes, he told me all of that."  Objective: Vitals:   05/16/20 2016 05/17/20 0038 05/17/20 0500 05/17/20 0729  BP: (!) 146/81 (!) 144/67 127/73 (!) 163/92  Pulse: (!) 105 99 (!) 102 (!) 105  Resp: 18 18 18 18   Temp: 98.7 F (37.1 C) 98.8 F (37.1 C) 99.1 F (37.3 C) 98.7 F (37.1  C)  TempSrc: Oral Oral Oral Oral  SpO2: 100% 100% 100% 100%  Weight:   98.6 kg   Height:        Intake/Output Summary (Last 24 hours) at 05/17/2020 0747 Last data filed at 05/17/2020 1610 Gross per 24 hour  Intake 130 ml  Output 1750 ml  Net -1620 ml   Filed Weights   05/15/20 2306 05/16/20 0025 05/17/20 0500  Weight: 100.1 kg 97.1 kg 98.6 kg    Examination:  General: 69 y.o. male resting in bed in NAD Cardiovascular: tachy, +S1, S2, no m/g/r, equal pulses throughout Respiratory: CTABL, no w/r/r, normal WOB GI: BS+, NDNT, no masses noted, no organomegaly noted MSK: No e/c/c Neuro: A&O x 3, no focal deficits Psyc: Appropriate interaction and affect, calm/cooperative  Data Reviewed: I have personally reviewed following labs and imaging studies.  CBC: Recent Labs  Lab 05/11/20 1459 05/11/20 1459 05/12/20 0502  05/15/20 0640 05/16/20 0550 05/16/20 1136 05/17/20 0500  WBC 13.6*  --  16.2* 12.2* 10.8*  --  10.4  NEUTROABS  --   --   --   --   --   --  6.2  HGB 9.8*   < > 9.5* 5.9* 7.3* 7.9* 6.1*  HCT 29.5*   < > 28.4* 19.5* 23.3* 24.8* 19.0*  MCV 81.5  --  82.8 88.6 87.9  --  88.4  PLT 428*  --  354 243 192  --  167   < > = values in this interval not displayed.   Basic Metabolic Panel: Recent Labs  Lab 05/11/20 1459 05/12/20 0035 05/13/20 1455 05/14/20 0650 05/15/20 0640 05/16/20 0550 05/16/20 1136 05/17/20 0500  NA 139  137   < > 136 136 133* 136  --  135  K 2.7*  2.6*   < > 3.8 3.7 3.5 3.4*  --  3.2*  CL 88*  85*   < > 100 100 100 100  --  99  CO2 22  23   < > 23 24 22 26   --  23  GLUCOSE 138*  140*   < > 121* 99 91 96  --  83  BUN 96*  127*   < > 75* 70* 70* 34*  --  38*  CREATININE 18.64*  18.17*   < > 10.85* 10.84* 10.34* 6.28*  --  6.67*  CALCIUM 6.4*  6.4*   < > 7.2* 7.0* 7.1* 7.1*  --  7.2*  MG 1.5*  --   --   --   --   --  1.7 1.6*  PHOS 5.4*  5.2*   < > 4.2 4.4 4.8* 3.0  --  2.8   < > = values in this interval not displayed.   GFR: Estimated Creatinine Clearance: 13.1 mL/min (A) (by C-G formula based on SCr of 6.67 mg/dL (H)). Liver Function Tests: Recent Labs  Lab 05/11/20 1459 05/12/20 0035 05/12/20 0502 05/12/20 1042 05/13/20 0730 05/13/20 0730 05/13/20 1455 05/14/20 0650 05/15/20 0640 05/16/20 0550 05/17/20 0500  AST 12*  --  11*  --  10*  --   --   --   --   --   --   ALT 9  --  6  --  6  --   --   --   --   --   --   ALKPHOS 77  --  68  --  62  --   --   --   --   --   --  BILITOT 0.6  --  0.6  --  1.0  --   --   --   --   --   --   PROT 7.6  --  6.1*  --  5.9*  --   --   --   --   --   --   ALBUMIN 4.3  4.1   < > 3.4*   < > 3.0*   < > 2.9* 2.8* 2.9* 2.7* 2.7*   < > = values in this interval not displayed.   Recent Labs  Lab 05/11/20 1459  LIPASE 190*   No results for input(s): AMMONIA in the last 168 hours. Coagulation  Profile: Recent Labs  Lab 05/12/20 0502  INR 1.1   Cardiac Enzymes: No results for input(s): CKTOTAL, CKMB, CKMBINDEX, TROPONINI in the last 168 hours. BNP (last 3 results) No results for input(s): PROBNP in the last 8760 hours. HbA1C: No results for input(s): HGBA1C in the last 72 hours. CBG: No results for input(s): GLUCAP in the last 168 hours. Lipid Profile: No results for input(s): CHOL, HDL, LDLCALC, TRIG, CHOLHDL, LDLDIRECT in the last 72 hours. Thyroid Function Tests: No results for input(s): TSH, T4TOTAL, FREET4, T3FREE, THYROIDAB in the last 72 hours. Anemia Panel: No results for input(s): VITAMINB12, FOLATE, FERRITIN, TIBC, IRON, RETICCTPCT in the last 72 hours. Sepsis Labs: No results for input(s): PROCALCITON, LATICACIDVEN in the last 168 hours.  Recent Results (from the past 240 hour(s))  SARS Coronavirus 2 by RT PCR (hospital order, performed in [facility] hospital lab) Nasopharyngeal Nasopharyngeal Swab     Status: None   Collection Time: 05/11/20  5:41 PM   Specimen: Nasopharyngeal Swab  Result Value Ref Range Status   SARS Coronavirus 2 NEGATIVE NEGATIVE Final    Comment: (NOTE) SARS-CoV-2 target nucleic acids are NOT DETECTED.  The SARS-CoV-2 RNA is generally detectable in upper and lower respiratory specimens during the acute phase of infection. The lowest concentration of SARS-CoV-2 viral copies this assay can detect is 250 copies / mL. A negative result does not preclude SARS-CoV-2 infection and should not be used as the sole basis for treatment or other patient management decisions.  A negative result may occur with improper specimen collection / handling, submission of specimen other than nasopharyngeal swab, presence of viral mutation(s) within the areas targeted by this assay, and inadequate number of viral copies (<250 copies / mL). A negative result must be combined with clinical observations, patient history, and epidemiological  information.  Fact Sheet for Patients:   StrictlyIdeas.no  Fact Sheet for Healthcare Providers: BankingDealers.co.za  This test is not yet approved or  cleared by the [place] FDA and has been authorized for detection and/or diagnosis of SARS-CoV-2 by FDA under an Emergency Use Authorization (EUA).  This EUA will remain in effect (meaning this test can be used) for the duration of the COVID-19 declaration under Section 564(b)(1) of the Act, 21 U.S.C. section 360bbb-3(b)(1), unless the authorization is terminated or revoked sooner.  Performed at [facility], [place] [address], [place], [place] [address]   Culture, Urine     Status: None   Collection Time: 05/11/20  6:05 PM   Specimen: Urine, Clean Catch  Result Value Ref Range Status   Specimen Description   Final    URINE, CLEAN CATCH Performed at [facility], [place] [address], [place],  [address]    Special Requests   Final    NONE Performed at [facility]  [place] [address], [place], [place] [address]    Culture   Final    NO GROWTH Performed at [facility], [place] [address], [place], [place] [address]    Report Status 05/13/2020 FINAL  Final      Radiology Studies: No results found.   Scheduled Meds: . sodium chloride   Intravenous Once  . sodium chloride   Intravenous Once  . acetaminophen  650 mg Oral Once  . calcium carbonate  800 mg of elemental calcium Oral TID  . Chlorhexidine Gluconate Cloth  6 each Topical Q0600  . Chlorhexidine Gluconate Cloth  6 each Topical Q0600  . darbepoetin (ARANESP) injection - NON-DIALYSIS  150 mcg Subcutaneous Q Wed-1800  . docusate sodium  100 mg Oral BID  . feeding supplement (NEPRO CARB STEADY)  237 mL Oral BID BM  . tamsulosin  0.8 mg Oral QPC supper   Continuous Infusions: . sodium chloride 100 mL/hr at 05/16/20 1428     LOS: 6 days    Time spent: 35  minutes spent in the coordination of care today.    Jonnie Finner, DO [facility]  If 7PM-7AM, please contact night-coverage [email or web address] 05/17/2020, 7:47 AM

## 2020-05-17 NOTE — Plan of Care (Signed)
  Problem: Pain Managment: Goal: General experience of comfort will improve Outcome: Completed/Met   Problem: Skin Integrity: Goal: Risk for impaired skin integrity will decrease Outcome: Completed/Met

## 2020-05-17 NOTE — Progress Notes (Signed)
Patient ID: Manuel Ortega, male   DOB: 01-May-1951, 70 y.o.   MRN: [id number]    Subjective: Pt with ongoing hematuria.  Hgb dropped to 6.1 requiring 2 units PRBCs today with appropriate increase to 8.6 post-transfusion.    Objective: Vital signs in last 24 hours: Temp:  [98.7 F (37.1 C)-99.1 F (37.3 C)] 98.7 F (37.1 C) (08/05 1614) Pulse Rate:  [99-117] 105 (08/05 1614) Resp:  [16-18] 16 (08/05 1614) BP: (127-163)/(67-92) 139/81 (08/05 1614) SpO2:  [100 %] 100 % (08/05 1614) Weight:  [98.6 kg] 98.6 kg (08/05 0500)  Intake/Output from previous day: 08/04 0701 - 08/05 0700 In: 130 [P.O.:120] Out: 1750 [Urine:1750] Intake/Output this shift: Total I/O In: 2179.5 [P.O.:837; Blood:1342.5] Out: 450 [Urine:450]  Physical Exam:  General: Alert and oriented GU: Urine still red but draining  Lab Results: Recent Labs    05/16/20 1136 05/17/20 0500 05/17/20 1630  HGB 7.9* 6.1* 8.6*  HCT 24.8* 19.0* 27.1*   BMET Recent Labs    05/16/20 0550 05/17/20 0500  NA 136 135  K 3.4* 3.2*  CL 100 99  CO2 26 23  GLUCOSE 96 83  BUN 34* 38*  CREATININE 6.28* 6.67*  CALCIUM 7.1* 7.2*    Procedure: I irrigated multiple clots from bladder.  Assessment/Plan: 1.  Urinary retention secondary to bladder outlet obstruction: Continue Foley catheter drainage and monitor his renal function.  Renal function not improved today.  No indication for dialysis today.  Will further assess definitive plan as an outpatient.  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  2.  Hematuria: This is likely secondary to decompression of his very distended bladder.  However, with drop of Hgb again today, this may suggest ongoing continued bleeding which suggests a possible alternative etiology.  Will continue catheter drainage overnight and check Hgb in AM.  Will consider changing catheter to hematuria catheter tomorrow if not improving  and if Hgb continues to drop significantly, may require cysto in OR during hospitalization.  3.  Elevated PSA: This is not a useful test in the setting of recent catheterization and urinary retention and is likely to be falsely elevated.  His digital rectal exam is not concerning for prostate cancer.  This can be rechecked in the future as an outpatient.   LOS: 6 days   Dutch Gray 05/17/2020, 5:54 PM

## 2020-05-17 NOTE — Progress Notes (Signed)
I went down to get urology cart for tomorrow's procedure per Dr. Alinda Money. OR staff didn't want me to take urology cart unless we need it now. Night shift will have to come back tomorrow morning to get the cart for 6:00 am procedure. Will reported to night shift.

## 2020-05-17 NOTE — Progress Notes (Signed)
[facility] Progress Note   Subjective/Interval Events  1750 of UOP   BUN and crt stable -  Continues to feel better-  Hgb down again to 6.1  Objective Vitals:   05/17/20 0729 05/17/20 0805 05/17/20 0807 05/17/20 0826  BP: (!) 163/92 130/74  131/81  Pulse: (!) 105 (!) 109  (!) 117  Resp: 18  16   Temp: 98.7 F (37.1 C) 98.9 F (37.2 C)    TempSrc: Oral Oral    SpO2: 100% 100%  100%  Weight:      Height:       Physical Exam General: nad-  Right sided vascath in place Heart: RRR, no rub Lungs: clear Abdomen: soft, NABS Extremities: no edema GU: foley draining blood tinged urine but clearing  Neuro:  better   Additional Objective Labs: Basic Metabolic Panel: Recent Labs  Lab 05/15/20 0640 05/16/20 0550 05/17/20 0500  NA 133* 136 135  K 3.5 3.4* 3.2*  CL 100 100 99  CO2 22 26 23   GLUCOSE 91 96 83  BUN 70* 34* 38*  CREATININE 10.34* 6.28* 6.67*  CALCIUM 7.1* 7.1* 7.2*  PHOS 4.8* 3.0 2.8   Liver Function Tests: Recent Labs  Lab 05/11/20 1459 05/12/20 0035 05/12/20 0502 05/12/20 1042 05/13/20 0730 05/13/20 1455 05/15/20 0640 05/16/20 0550 05/17/20 0500  AST 12*  --  11*  --  10*  --   --   --   --   ALT 9  --  6  --  6  --   --   --   --   ALKPHOS 77  --  68  --  62  --   --   --   --   BILITOT 0.6  --  0.6  --  1.0  --   --   --   --   PROT 7.6  --  6.1*  --  5.9*  --   --   --   --   ALBUMIN 4.3  4.1   < > 3.4*   < > 3.0*   < > 2.9* 2.7* 2.7*   < > = values in this interval not displayed.   Recent Labs  Lab 05/11/20 1459  LIPASE 190*   CBC: Recent Labs  Lab 05/11/20 1459 05/11/20 1459 05/12/20 0502 05/12/20 0502 05/15/20 0640 05/15/20 0640 05/16/20 0550 05/16/20 1136 05/17/20 0500  WBC 13.6*   < > 16.2*   < > 12.2*  --  10.8*  --  10.4  NEUTROABS  --   --   --   --   --   --   --   --  6.2  HGB 9.8*   < > 9.5*   < > 5.9*   < > 7.3* 7.9* 6.1*  HCT 29.5*   < > 28.4*   < > 19.5*   < > 23.3* 24.8* 19.0*  MCV 81.5  --   82.8  --  88.6  --  87.9  --  88.4  PLT 428*   < > 354   < > 243  --  192  --  167   < > = values in this interval not displayed.   Blood Culture    Component Value Date/Time   SDES  05/11/2020 1805    URINE, CLEAN CATCH Performed at [facility], [place] [address] [place], [place] [address]    SPECREQUEST  05/11/2020 1805    NONE Performed at  [facility], [place] [address], [place], [place] [address]    CULT  05/11/2020 1805    NO GROWTH Performed at [facility] [address], [place], [place] [address]    REPTSTATUS 05/13/2020 FINAL 05/11/2020 1805    Cardiac Enzymes: No results for input(s): CKTOTAL, CKMB, CKMBINDEX, TROPONINI in the last 168 hours. CBG: No results for input(s): GLUCAP in the last 168 hours. Iron Studies:  No results for input(s): IRON, TIBC, TRANSFERRIN, FERRITIN in the last 72 hours. @lablastinr3 @ Studies/Results: No results found. Medications: . sodium chloride 100 mL/hr at 05/16/20 1428   . sodium chloride   Intravenous Once  . sodium chloride   Intravenous Once  . acetaminophen  650 mg Oral Once  . calcium carbonate  800 mg of elemental calcium Oral TID  . Chlorhexidine Gluconate Cloth  6 each Topical Q0600  . Chlorhexidine Gluconate Cloth  6 each Topical Q0600  . darbepoetin (ARANESP) injection - NON-DIALYSIS  150 mcg Subcutaneous Q Wed-1800  . docusate sodium  100 mg Oral BID  . feeding supplement (NEPRO CARB STEADY)  237 mL Oral BID BM  . magnesium oxide  400 mg Oral Daily  . potassium chloride  40 mEq Oral Daily  . tamsulosin  0.8 mg Oral QPC supper   Results for Manuel, Ortega (MRN [id number]) as of 05/13/2020 14:16  Ref. Range 05/12/2020 00:30  Prostatic Specific Antigen Latest Ref Range: 0.00 - 4.00 ng/mL 28.60 (H)    Assessment/Plan 1. AKI from obstructive uropathy, uremia present without significant initial improvement with placement of foley.  Did HD on 8/1 for uremia with clinical  improvement-  24 hours with good UOP but BUN and crt not better.  another HD treatment on 8/3-  Continues to hold his own, no need for HD today-  Too soon to declare what his dialysis needs will be-  Cont to monitor  2. 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 Elevated PSA from #2, probably, maintain foley-  uro f/u-    4. Hypokalemia, likely due to gastrointestinal losses;  Supplement ordered 5. Hypocalcemia from severity of renal failure, improving 6. Hypomagnesemia from gastrointestinal losses, on supplement 7. Hyperphosphatemia secondary to #1- better 8. Metabolic acidosis and alkalosis, from renal failure and gatric losses + PO NaHCO3 intake; AG down- resolved 9. Depression 10. Normocytic anemia, TSAT 26%- gave IV iron- feraheme 8/3 -  Also discovered  to have a hgb under 6-  Got 2 units PRBC on 8/3-  - dosed with ESA as well- hgb 6.1- gettting more blood ? Presumed GU source      Manuel Ortega  05/17/2020, 8:32 AM  [employer]

## 2020-05-18 LAB — TYPE AND SCREEN
ABO/RH(D): O POS
Antibody Screen: NEGATIVE
Unit division: 0
Unit division: 0
Unit division: 0
Unit division: 0

## 2020-05-18 LAB — BPAM RBC
Blood Product Expiration Date: 202108312359
Blood Product Expiration Date: 202108312359
Blood Product Expiration Date: 202108312359
Blood Product Expiration Date: 202108312359
ISSUE DATE / TIME: 202108031135
ISSUE DATE / TIME: 202108031456
ISSUE DATE / TIME: 202108050753
ISSUE DATE / TIME: 202108051046
Unit Type and Rh: 5100
Unit Type and Rh: 5100
Unit Type and Rh: 5100
Unit Type and Rh: 5100

## 2020-05-18 LAB — RENAL FUNCTION PANEL
Albumin: 3 g/dL — ABNORMAL LOW (ref 3.5–5.0)
Anion gap: 12 (ref 5–15)
BUN: 50 mg/dL — ABNORMAL HIGH (ref 8–23)
CO2: 21 mmol/L — ABNORMAL LOW (ref 22–32)
Calcium: 7.6 mg/dL — ABNORMAL LOW (ref 8.9–10.3)
Chloride: 101 mmol/L (ref 98–111)
Creatinine, Ser: 7.02 mg/dL — ABNORMAL HIGH (ref 0.61–1.24)
GFR calc Af Amer: 8 mL/min — ABNORMAL LOW (ref >60)
GFR calc non Af Amer: 7 mL/min — ABNORMAL LOW (ref >60)
Glucose, Bld: 94 mg/dL (ref 70–99)
Phosphorus: 2.5 mg/dL (ref 2.5–4.6)
Potassium: 3.5 mmol/L (ref 3.5–5.1)
Sodium: 134 mmol/L — ABNORMAL LOW (ref 135–145)

## 2020-05-18 LAB — CBC WITH DIFFERENTIAL/PLATELET
Abs Immature Granulocytes: 0.35 10*3/uL — ABNORMAL HIGH (ref 0.00–0.07)
Basophils Absolute: 0.1 10*3/uL (ref 0.0–0.1)
Basophils Relative: 1 %
Eosinophils Absolute: 0.2 10*3/uL (ref 0.0–0.5)
Eosinophils Relative: 2 %
HCT: 29.1 % — ABNORMAL LOW (ref 39.0–52.0)
Hemoglobin: 9.3 g/dL — ABNORMAL LOW (ref 13.0–17.0)
Immature Granulocytes: 3 %
Lymphocytes Relative: 15 %
Lymphs Abs: 1.7 10*3/uL (ref 0.7–4.0)
MCH: 27.4 pg (ref 26.0–34.0)
MCHC: 32 g/dL (ref 30.0–36.0)
MCV: 85.6 fL (ref 80.0–100.0)
Monocytes Absolute: 0.7 10*3/uL (ref 0.1–1.0)
Monocytes Relative: 6 %
Neutro Abs: 8.5 10*3/uL — ABNORMAL HIGH (ref 1.7–7.7)
Neutrophils Relative %: 73 %
Platelets: 191 10*3/uL (ref 150–400)
RBC: 3.4 MIL/uL — ABNORMAL LOW (ref 4.22–5.81)
RDW: 14.1 % (ref 11.5–15.5)
WBC: 11.5 10*3/uL — ABNORMAL HIGH (ref 4.0–10.5)
nRBC: 0 % (ref 0.0–0.2)

## 2020-05-18 MED ORDER — SODIUM CHLORIDE 0.9 % IR SOLN
3000.0000 mL | Status: DC
  Administered 2020-05-18: 3000 mL

## 2020-05-18 MED ORDER — WITCH HAZEL-GLYCERIN EX PADS
MEDICATED_PAD | CUTANEOUS | Status: DC | PRN
  Filled 2020-05-18: qty 100

## 2020-05-18 MED ORDER — SODIUM CHLORIDE 0.9 % IV SOLN
510.0000 mg | Freq: Once | INTRAVENOUS | Status: AC
  Administered 2020-05-18: 510 mg via INTRAVENOUS
  Filled 2020-05-18: qty 17

## 2020-05-18 MED ORDER — PSYLLIUM 95 % PO PACK
1.0000 | PACK | Freq: Every day | ORAL | Status: DC
  Administered 2020-05-19 – 2020-06-01 (×11): 1 via ORAL
  Filled 2020-05-18 (×13): qty 1

## 2020-05-18 NOTE — Progress Notes (Addendum)
Patient refuses peri/foley care this morning. He states he is too sensitive in that area. He states he will refuse peri/foley care his whole stay. NT offered for patient to clean himself it that made him more comfortable; he continues to refuse. Education given but patient refuses.

## 2020-05-18 NOTE — Progress Notes (Addendum)
Patient ID: Manuel Ortega, male   DOB: May 11, 1951, 69 y.o.   MRN: [id number]    Subjective: Pt without new complaints.    Objective: Vital signs in last 24 hours: Temp:  [98.4 F (36.9 C)-99.1 F (37.3 C)] 98.6 F (37 C) (08/06 0722) Pulse Rate:  [89-117] 98 (08/06 0722) Resp:  [16-20] 18 (08/06 0722) BP: (130-153)/(74-85) 153/77 (08/06 0722) SpO2:  [97 %-100 %] 99 % (08/06 0722) Weight:  [99.6 kg] 99.6 kg (08/06 0324)  Intake/Output from previous day: 08/05 0701 - 08/06 0700 In: 2379.5 [P.O.:1037; Blood:1342.5] Out: 2127 [Urine:2125; Stool:2] Intake/Output this shift: No intake/output data recorded.  Physical Exam:  General: Alert and oriented GU: Urine still red but draining but not improved  Lab Results: Recent Labs    05/16/20 1136 05/17/20 0500 05/17/20 1630  HGB 7.9* 6.1* 8.6*  HCT 24.8* 19.0* 27.1*   BMET Recent Labs    05/17/20 0500 05/18/20 0500  NA 135 134*  K 3.2* 3.5  CL 99 101  CO2 23 21*  GLUCOSE 83 94  BUN 38* 50*  CREATININE 6.67* 7.02*  CALCIUM 7.2* 7.6*     Studies/Results: No results found.  Assessment/Plan: 1) Hematuria: Still significant.  Hgb not drawn this morning.  Catheter changed to hematuria catheter and a large amount of clot removed.  Started CBI on slow drip with urine now mostly clear.  Will continue to titrate this down. If Hgb continues to drop significantly over next 24 hrs and hematuria persists, may need to consider cystoscopy in the OR for further evaluation of etiology.  Otherwise, will continue conservative management with outpatient evaluation if improving. 2) AKI/hydronephrosis: Renal function not improving.  Nephrology following and patient receiving HD. 3) Elevated PSA: Will eventually repeat but currently this is most likely false positive in setting of catheterization/retention.  DRE is unremarkable.   LOS: 7 days   Dutch Gray 05/18/2020, 8:02 AM    Pt checked on  This afternoon.  Urine now mostly clear with  CBI off.

## 2020-05-18 NOTE — Progress Notes (Signed)
Physical Therapy Treatment Patient Details Name: Manuel Ortega MRN: [id number] DOB: 10/16/1950 Today's Date: 05/18/2020    History of Present Illness 69 y.o. male with medical history significant of major depression and hypertension,  not taking any medication,  has no primary care physician, he has no medical care in the last 5 years presenting in the [facility] emergency department with nausea and vomiting for past 4 days.  He also report having decreased appetite, 68  intermittent cough but denies any weight loss.  He denies any history of kidney disease.    PT Comments    Patient progressing slowly towards PT goals. Pt highly anxious to perform mobility and has anticipatory pain at foley site afraid to mobilize or see any blood in foley bag. Improved ambulation distance slightly but stayed within room as pt declining to go into hallway, "I am trying to take baby steps." Reports dizziness after 40' resulting in need to return to supine. Pt pallor and BP 155/99. HR up to 135 bpm with activity. Encouraged increasing activity while in the hospital. Will follow.    Follow Up Recommendations  Home health PT     Equipment Recommendations  3in1 (PT)    Recommendations for Other Services       Precautions / Restrictions Precautions Precautions: Fall Precaution Comments: highly anxious, bladder irrigation Restrictions Weight Bearing Restrictions: No    Mobility  Bed Mobility Overal bed mobility: Needs Assistance Bed Mobility: Supine to Sit;Sit to Supine     Supine to sit: Min guard;HOB elevated Sit to supine: HOB elevated;Min assist   General bed mobility comments: Increased time, light assist with pt holding foley tubing due to fear of pain to get to EOB.  Transfers Overall transfer level: Needs assistance Equipment used: None Transfers: Sit to/from Stand Sit to Stand: Min assist         General transfer comment: Min A to steady in standing, slow to rise. No dizziness. Declined  transfer to chair, "which is lower?" "Don't want to have to squat down as low."  Ambulation/Gait Ambulation/Gait assistance: Min guard Gait Distance (Feet): 40 Feet Assistive device: None Gait Pattern/deviations: Step-to pattern;Step-through pattern;Decreased stride length Gait velocity: reduced   General Gait Details: Very slow, guarded gait within room only (declines leaving room, "I want to take baby steps first"); close Min guard for safety. A few standing rest breaks. + dizziness after 40 feet, pallor present. BP 155/99 ("that is low for me").   Stairs             Wheelchair Mobility    Modified Rankin (Stroke Patients Only)       Balance Overall balance assessment: Needs assistance Sitting-balance support: No upper extremity supported;Feet supported Sitting balance-Leahy Scale: Fair Sitting balance - Comments: requires UE support as position of comfort; reports pressure near foley site, lower abdomen   Standing balance support: During functional activity Standing balance-Leahy Scale: Fair Standing balance comment: Min guard for safety.                            Cognition Arousal/Alertness: Awake/alert Behavior During Therapy: Anxious Overall Cognitive Status: No family/caregiver present to determine baseline cognitive functioning Area of Impairment: Following commands;Safety/judgement;Problem solving;Awareness                       Following Commands: Follows one step commands consistently Safety/Judgement: Decreased awareness of deficits Awareness: Emergent Problem Solving: Requires verbal cues General Comments: Highly anxious  about mobility; anticipatory pain. Able to recall that he passed out yesterday but does not remember events. Increased time to perform all mobility. 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 Self limiting.      Exercises      General Comments General comments (skin integrity,  edema, etc.): Dizziness present after walking 40', also noted to be pallor. Returned to supine. BP 155/99.      Pertinent Vitals/Pain Pain Assessment: Faces Faces Pain Scale: Hurts little more Pain Location: foley catheter Pain Descriptors / Indicators: Sore Pain Intervention(s): Monitored during session;Repositioned    Home Living                      Prior Function            PT Goals (current goals can now be found in the care plan section) Progress towards PT goals: Progressing toward goals (slowly)    Frequency    Min 3X/week      PT Plan Current plan remains appropriate    Co-evaluation              AM-PAC PT "6 Clicks" Mobility   Outcome Measure  Help needed turning from your back to your side while in a flat bed without using bedrails?: None Help needed moving from lying on your back to sitting on the side of a flat bed without using bedrails?: A Little Help needed moving to and from a bed to a chair (including a wheelchair)?: A Little Help needed standing up from a chair using your arms (e.g., wheelchair or bedside chair)?: A Little Help needed to walk in hospital room?: A Little Help needed climbing 3-5 steps with a railing? : A Little 6 Click Score: 19    End of Session Equipment Utilized During Treatment: Gait belt Activity Tolerance: Other (comment);Treatment limited secondary to medical complications (Comment) (self limiting due to anxiety; dizziness) Patient left: in bed;with call bell/phone within reach;with bed alarm set;with SCD's reapplied Nurse Communication: Mobility status PT Visit Diagnosis: Unsteadiness on feet (R26.81);Other abnormalities of gait and mobility (R26.89);Muscle weakness (generalized) (M62.81);Other symptoms and signs involving the nervous system (R29.898)     Time: 3614-4315 PT Time Calculation (min) (ACUTE ONLY): 32 min  Charges:  $Gait Training: 8-22 mins $Therapeutic Activity: 8-22 mins                      Marisa Severin, PT, DPT Acute Rehabilitation Services Pager [phone] Office [place] 05/18/2020, 12:39 PM

## 2020-05-18 NOTE — Care Management Important Message (Signed)
Important Message  Patient Details  Name: Manuel Ortega MRN: [id number] Date of Birth: August 27, 1951   Medicare Important Message Given:  Yes     Shelda Altes 05/18/2020, 8:36 AM

## 2020-05-18 NOTE — Progress Notes (Signed)
[facility] Progress Note   Subjective/Interval Events  2100 of UOP   BUN and crt worse -  Continues to feel better-  Foley changed to a hematuria catheter and doing CBI-  Hgb 9.3  Objective Vitals:   05/18/20 0052 05/18/20 0319 05/18/20 0324 05/18/20 0722  BP: (!) 145/85 134/82  (!) 153/77  Pulse: 98 98  98  Resp: 18 20  18   Temp: 98.6 F (37 C) 98.6 F (37 C)  98.6 F (37 C)  TempSrc: Oral Oral  Oral  SpO2: 97% 100%  99%  Weight:   99.6 kg   Height:       Physical Exam General: nad-  Right sided vascath in place-  Placed 8/1 Heart: RRR, no rub Lungs: clear Abdomen: soft, NABS Extremities: no edema GU: foley draining blood tinged urine but clearing  Neuro:  better   Additional Objective Labs: Basic Metabolic Panel: Recent Labs  Lab 05/16/20 0550 05/17/20 0500 05/18/20 0500  NA 136 135 134*  K 3.4* 3.2* 3.5  CL 100 99 101  CO2 26 23 21*  GLUCOSE 96 83 94  BUN 34* 38* 50*  CREATININE 6.28* 6.67* 7.02*  CALCIUM 7.1* 7.2* 7.6*  PHOS 3.0 2.8 2.5   Liver Function Tests: Recent Labs  Lab 05/11/20 1459 05/12/20 0035 05/12/20 0502 05/12/20 1042 05/13/20 0730 05/13/20 1455 05/16/20 0550 05/17/20 0500 05/18/20 0500  AST 12*  --  11*  --  10*  --   --   --   --   ALT 9  --  6  --  6  --   --   --   --   ALKPHOS 77  --  68  --  62  --   --   --   --   BILITOT 0.6  --  0.6  --  1.0  --   --   --   --   PROT 7.6  --  6.1*  --  5.9*  --   --   --   --   ALBUMIN 4.3  4.1   < > 3.4*   < > 3.0*   < > 2.7* 2.7* 3.0*   < > = values in this interval not displayed.   Recent Labs  Lab 05/11/20 1459  LIPASE 190*   CBC: Recent Labs  Lab 05/15/20 0640 05/15/20 0640 05/16/20 0550 05/16/20 1136 05/17/20 0500 05/17/20 1630 05/18/20 0836  WBC 12.2*   < > 10.8*  --  10.4 13.1* 11.5*  NEUTROABS  --   --   --   --  6.2  --  8.5*  HGB 5.9*   < > 7.3*   < > 6.1* 8.6* 9.3*  HCT 19.5*   < > 23.3*   < > 19.0* 27.1* 29.1*  MCV 88.6  --  87.9  --   88.4 87.4 85.6  PLT 243   < > 192  --  167 178 191   < > = values in this interval not displayed.   Blood Culture    Component Value Date/Time   SDES  05/11/2020 1805    URINE, CLEAN CATCH Performed at [facility], [place] [address], [place], [place] [address]    SPECREQUEST  05/11/2020 1805    NONE Performed at [facility], [place] [address], [place],  [address]    CULT  05/11/2020 1805    NO GROWTH Performed at [facility], [address] [place], [place] [address]    REPTSTATUS 05/13/2020 FINAL 05/11/2020 1805    Cardiac Enzymes: No results for input(s): CKTOTAL, CKMB, CKMBINDEX, TROPONINI in the last 168 hours. CBG: No results for input(s): GLUCAP in the last 168 hours. Iron Studies:  No results for input(s): IRON, TIBC, TRANSFERRIN, FERRITIN in the last 72 hours. @lablastinr3 @ Studies/Results: No results found. Medications: . sodium chloride 100 mL/hr at 05/16/20 1428  . sodium chloride irrigation     . calcium carbonate  800 mg of elemental calcium Oral TID  . Chlorhexidine Gluconate Cloth  6 each Topical Q0600  . darbepoetin (ARANESP) injection - NON-DIALYSIS  150 mcg Subcutaneous Q Wed-1800  . docusate sodium  100 mg Oral BID  . feeding supplement (NEPRO CARB STEADY)  237 mL Oral BID BM  . magnesium oxide  400 mg Oral Daily  . multivitamin  1 tablet Oral QHS  . tamsulosin  0.8 mg Oral QPC supper   Results for MAZIAH, KEELING (MRN [id number]) as of 05/13/2020 14:16  Ref. Range 05/12/2020 00:30  Prostatic Specific Antigen Latest Ref Range: 0.00 - 4.00 ng/mL 28.60 (H)    Assessment/Plan 1. AKI from obstructive uropathy, uremia present without significant initial improvement with placement of foley.  Did HD on 8/1 for uremia with clinical improvement-  24 hours with good UOP but BUN and crt not better.  another HD treatment on 8/3-  Now seeing BUN and crt rise off of HD-  Suspect will need another HD tomorrow-  Too  soon to declare what his long term dialysis needs will be-  Cont to monitor  2. Obstructive uropathy presumed from BPH, severe, now with Foley catheter draining pink blood-tinged urine- urology involved - CBI 3. Elevated PSA from #2, probably, maintain foley-  uro f/u-    4. Hypokalemia, likely due to gastrointestinal losses;  Supplement ordered 5. Hypocalcemia from severity of renal failure, improving 6. Hypomagnesemia from gastrointestinal losses, on supplement 7. Hyperphosphatemia secondary to #1- better 8. Metabolic acidosis and alkalosis, from renal failure and gatric losses + PO NaHCO3 intake; AG down- resolved 9. Depression 10. Normocytic anemia, TSAT 26%- gave IV iron- feraheme 8/3 will redose today -  Also discovered  to have a hgb under 6-  Got 2 units PRBC on 8/3-  - dosed with ESA as well- hgb 6.1- more blood on 8/5? Presumed GU source- hgb better today       Manuel Ortega  05/18/2020, 10:33 AM  [facility]

## 2020-05-18 NOTE — Progress Notes (Signed)
PROGRESS NOTE    Manuel Ortega  ALP:[id number] DOB: [birth year] DOA: 05/11/2020 PCP: System, Pcp Not In   Brief Narrative:   Manuel Ortega an 69 y.o.malepast medical history significant of depression and hypertension noncompliant with his medication with no primary care doctor for over the last 5 years presented to the [facility] long hospital with nausea vomiting that started 4 days prior to admission. In the ED he was found to have a white count of 13 BUN of 127 creatinine of 18 potassium was 2.6 anion gap of 29 and a bicarb of 23. 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 Nephrology was consulted, as well as IR for tunneled HD catheter.  8/6: Now on CBI. Appreciate urology assistance. UOP is still pink tinged but improved. Hgb is holding at 9.3 this AM. Renal fxn sliding. Per nephro, may need HD tomorrow. Appreciate nephro assistance. He is otherwise ok. In pleasant mood this AM.    Assessment & Plan: Acute kidney injury due to obstructive uropathy Uremia N/V - Nausea vomiting likely due to uremic symptoms, now resolved. - IR was consulted and HD tunneled catheter placed on 05/12/2020. - Nephrology onboard. HD for uremia. - His uremic symptoms are clinically improved his nausea is improved as well as his appetite. - 8/6: renal fxn worse this AM. Probable HD in AM per nephro; appreciate assistance  Obstructive uropathy/BPH Hematuria Urinary retention - appreciate urology assistance     - continues to have gross hematuria; continue foley, urology to follow up outpt     - urology to follow up retention outpt.     - 8/6: now s/p 4 units pRBC; urology has changed cath to hematuria cath; he's on CBI, appreciate assistance  Syncope - episode while having BM this AM; likely vasovagal - check orthostatics: tachy throughout test, but BP ok. Will get small amount of fluids. - orthostatics ok     - no further episodes  noted  Hypokalemia Hypomagnesemia Hypocalcemia Hyponatremia Secondary hyperphosphatemia - replace per nephro, monitor  Anemia of chronic renal disease vomiting: Fe-deficiency Acute blood loss anemia - Fe mildly low, s/p IV Fesupplement     - blood loss from hematuria     - 8/6: now s/p 4 units pRBC; urology to exchange cath and take to OR today; appreciate assistance  Leukocytosis: - likely reactive; mildly elevated, follow  Elevated lipase - Likely due to nausea and vomiting he has no pancreatitis. - Tolerating his diet well.  DVT prophylaxis: SCDs Code Status: FULL Family Communication: None at bedside   Status is: Inpatient  Remains inpatient appropriate because:Inpatient level of care appropriate due to severity of illness   Dispo: The patient is from: Home              Anticipated d/c is to: Home              Anticipated d/c date is: > 3 days              Patient currently is not medically stable to d/c.  Consultants:   Nephrology  Urology  ROS:  Denies CP, N, V, ab pain . Remainder ROS is negative for all not previously mentioned.  Subjective: "I'm trying to stay positive."  Objective: Vitals:   05/18/20 0052 05/18/20 0319 05/18/20 0324 05/18/20 0722  BP: (!) 145/85 134/82  (!) 153/77  Pulse: 98 98  98  Resp: 18 20  18   Temp: 98.6 F (37 C) 98.6 F (37 C)  98.6 F (37 C)  TempSrc: Oral Oral  Oral  SpO2: 97% 100%  99%  Weight:   99.6 kg   Height:        Intake/Output Summary (Last 24 hours) at 05/18/2020 0737 Last data filed at 05/18/2020 0500 Gross per 24 hour  Intake 2379.5 ml  Output 2127 ml  Net 252.5 ml   Filed Weights   05/16/20 0025 05/17/20 0500 05/18/20 0324  Weight: 97.1 kg 98.6 kg 99.6 kg    Examination:  General: 69 y.o. male resting in bed in NAD Cardiovascular: RRR, +S1, S2, no m/g/r Respiratory: CTABL, no w/r/r, normal WOB GI: BS+, NDNT, no masses noted, no organomegaly noted MSK: No  e/c/c Neuro: A&O x 3, no focal deficits Psyc: Appropriate interaction and affect, calm/cooperative   Data Reviewed: I have personally reviewed following labs and imaging studies.  CBC: Recent Labs  Lab 05/12/20 0502 05/12/20 0502 05/15/20 0640 05/16/20 0550 05/16/20 1136 05/17/20 0500 05/17/20 1630  WBC 16.2*  --  12.2* 10.8*  --  10.4 13.1*  NEUTROABS  --   --   --   --   --  6.2  --   HGB 9.5*   < > 5.9* 7.3* 7.9* 6.1* 8.6*  HCT 28.4*   < > 19.5* 23.3* 24.8* 19.0* 27.1*  MCV 82.8  --  88.6 87.9  --  88.4 87.4  PLT 354  --  243 192  --  167 178   < > = values in this interval not displayed.   Basic Metabolic Panel: Recent Labs  Lab 05/11/20 1459 05/12/20 0035 05/14/20 0650 05/15/20 0640 05/16/20 0550 05/16/20 1136 05/17/20 0500 05/18/20 0500  NA 139  137   < > 136 133* 136  --  135 134*  K 2.7*  2.6*   < > 3.7 3.5 3.4*  --  3.2* 3.5  CL 88*  85*   < > 100 100 100  --  99 101  CO2 22  23   < > 24 22 26   --  23 21*  GLUCOSE 138*  140*   < > 99 91 96  --  83 94  BUN 96*  127*   < > 70* 70* 34*  --  38* 50*  CREATININE 18.64*  18.17*   < > 10.84* 10.34* 6.28*  --  6.67* 7.02*  CALCIUM 6.4*  6.4*   < > 7.0* 7.1* 7.1*  --  7.2* 7.6*  MG 1.5*  --   --   --   --  1.7 1.6*  --   PHOS 5.4*  5.2*   < > 4.4 4.8* 3.0  --  2.8 2.5   < > = values in this interval not displayed.   GFR: Estimated Creatinine Clearance: 12.5 mL/min (A) (by C-G formula based on SCr of 7.02 mg/dL (H)). Liver Function Tests: Recent Labs  Lab 05/11/20 1459 05/12/20 0035 05/12/20 0502 05/12/20 1042 05/13/20 0730 05/13/20 1455 05/14/20 0650 05/15/20 0640 05/16/20 0550 05/17/20 0500 05/18/20 0500  AST 12*  --  11*  --  10*  --   --   --   --   --   --   ALT 9  --  6  --  6  --   --   --   --   --   --   ALKPHOS 77  --  68  --  62  --   --   --   --   --   --  BILITOT 0.6  --  0.6  --  1.0  --   --   --   --   --   --   PROT 7.6  --  6.1*  --  5.9*  --   --   --   --   --   --    ALBUMIN 4.3  4.1   < > 3.4*   < > 3.0*   < > 2.8* 2.9* 2.7* 2.7* 3.0*   < > = values in this interval not displayed.   Recent Labs  Lab 05/11/20 1459  LIPASE 190*   No results for input(s): AMMONIA in the last 168 hours. Coagulation Profile: Recent Labs  Lab 05/12/20 0502  INR 1.1   Cardiac Enzymes: No results for input(s): CKTOTAL, CKMB, CKMBINDEX, TROPONINI in the last 168 hours. BNP (last 3 results) No results for input(s): PROBNP in the last 8760 hours. HbA1C: No results for input(s): HGBA1C in the last 72 hours. CBG: No results for input(s): GLUCAP in the last 168 hours. Lipid Profile: No results for input(s): CHOL, HDL, LDLCALC, TRIG, CHOLHDL, LDLDIRECT in the last 72 hours. Thyroid Function Tests: No results for input(s): TSH, T4TOTAL, FREET4, T3FREE, THYROIDAB in the last 72 hours. Anemia Panel: No results for input(s): VITAMINB12, FOLATE, FERRITIN, TIBC, IRON, RETICCTPCT in the last 72 hours. Sepsis Labs: No results for input(s): PROCALCITON, LATICACIDVEN in the last 168 hours.  Recent Results (from the past 240 hour(s))  SARS Coronavirus 2 by RT PCR (hospital order, performed in [facility] hospital lab) Nasopharyngeal Nasopharyngeal Swab     Status: None   Collection Time: 05/11/20  5:41 PM   Specimen: Nasopharyngeal Swab  Result Value Ref Range Status   SARS Coronavirus 2 NEGATIVE NEGATIVE Final    Comment: (NOTE) SARS-CoV-2 target nucleic acids are NOT DETECTED.  The SARS-CoV-2 RNA is generally detectable in upper and lower respiratory specimens during the acute phase of infection. The lowest concentration of SARS-CoV-2 viral copies this assay can detect is 250 copies / mL. A negative result does not preclude SARS-CoV-2 infection and should not be used as the sole basis for treatment or other patient management decisions.  A negative result may occur with improper specimen collection / handling, submission of specimen other than nasopharyngeal swab,  presence of viral mutation(s) within the areas targeted by this assay, and inadequate number of viral copies (<250 copies / mL). A negative result must be combined with clinical observations, patient history, and epidemiological information.  Fact Sheet for Patients:   StrictlyIdeas.no  Fact Sheet for Healthcare Providers: BankingDealers.co.za  This test is not yet approved or  cleared by the [place] FDA and has been authorized for detection and/or diagnosis of SARS-CoV-2 by FDA under an Emergency Use Authorization (EUA).  This EUA will remain in effect (meaning this test can be used) for the duration of the COVID-19 declaration under Section 564(b)(1) of the Act, 21 U.S.C. section 360bbb-3(b)(1), unless the authorization is terminated or revoked sooner.  Performed at [facility], [place] [address], [place], [place] [address]   Culture, Urine     Status: None   Collection Time: 05/11/20  6:05 PM   Specimen: Urine, Clean Catch  Result Value Ref Range Status   Specimen Description   Final    URINE, CLEAN CATCH Performed at [facility], [place] [address] [place], [place] [address]    Special Requests   Final    NONE Performed at [facility]  [place] [address] [place], [place] [address]    Culture   Final    NO GROWTH Performed at [facility], [place] [address], [place], [place] [address]    Report Status 05/13/2020 FINAL  Final      Radiology Studies: No results found.   Scheduled Meds: . calcium carbonate  800 mg of elemental calcium Oral TID  . Chlorhexidine Gluconate Cloth  6 each Topical Q0600  . darbepoetin (ARANESP) injection - NON-DIALYSIS  150 mcg Subcutaneous Q Wed-1800  . docusate sodium  100 mg Oral BID  . feeding supplement (NEPRO CARB STEADY)  237 mL Oral BID BM  . magnesium oxide  400 mg Oral Daily  . multivitamin  1 tablet Oral QHS  . potassium  chloride  40 mEq Oral Daily  . tamsulosin  0.8 mg Oral QPC supper   Continuous Infusions: . sodium chloride 100 mL/hr at 05/16/20 1428     LOS: 7 days    Time spent: 25 minutes spent in the coordination of care today.    Jonnie Finner, DO [facility]  If 7PM-7AM, please contact night-coverage [email or web address] 05/18/2020, 7:37 AM

## 2020-05-18 NOTE — Care Management (Signed)
05-18-20 1418 Case Manager offered choice for home health services. Provided patient with the Medicare.gov lis. Patient is from home alone and is unsure of plan at this time. Patient may have HD on Saturday. Case Manager will continue to follow for disposition needs. Patient is without a PCP and he does not drive since he had his syncopal episode. Patient states he has no friends and family here in [place]- he has been here for 5 years. Patient will benefit from Dr's [facility] calls, so they can visit him in the home. Case Manager will continue to follow for additional transition of care needs. Bethena Roys, RN,BSN Case Manager

## 2020-05-19 LAB — CBC WITH DIFFERENTIAL/PLATELET
Abs Immature Granulocytes: 0.38 10*3/uL — ABNORMAL HIGH (ref 0.00–0.07)
Basophils Absolute: 0.1 10*3/uL (ref 0.0–0.1)
Basophils Relative: 1 %
Eosinophils Absolute: 0.2 10*3/uL (ref 0.0–0.5)
Eosinophils Relative: 2 %
HCT: 28.7 % — ABNORMAL LOW (ref 39.0–52.0)
Hemoglobin: 9.1 g/dL — ABNORMAL LOW (ref 13.0–17.0)
Immature Granulocytes: 3 %
Lymphocytes Relative: 24 %
Lymphs Abs: 2.9 10*3/uL (ref 0.7–4.0)
MCH: 28.2 pg (ref 26.0–34.0)
MCHC: 31.7 g/dL (ref 30.0–36.0)
MCV: 88.9 fL (ref 80.0–100.0)
Monocytes Absolute: 1.1 10*3/uL — ABNORMAL HIGH (ref 0.1–1.0)
Monocytes Relative: 9 %
Neutro Abs: 7.4 10*3/uL (ref 1.7–7.7)
Neutrophils Relative %: 61 %
Platelets: 211 10*3/uL (ref 150–400)
RBC: 3.23 MIL/uL — ABNORMAL LOW (ref 4.22–5.81)
RDW: 14.5 % (ref 11.5–15.5)
WBC: 12.2 10*3/uL — ABNORMAL HIGH (ref 4.0–10.5)
nRBC: 0 % (ref 0.0–0.2)

## 2020-05-19 LAB — RENAL FUNCTION PANEL
Albumin: 2.8 g/dL — ABNORMAL LOW (ref 3.5–5.0)
Anion gap: 10 (ref 5–15)
BUN: 54 mg/dL — ABNORMAL HIGH (ref 8–23)
CO2: 20 mmol/L — ABNORMAL LOW (ref 22–32)
Calcium: 7.8 mg/dL — ABNORMAL LOW (ref 8.9–10.3)
Chloride: 104 mmol/L (ref 98–111)
Creatinine, Ser: 7.47 mg/dL — ABNORMAL HIGH (ref 0.61–1.24)
GFR calc Af Amer: 8 mL/min — ABNORMAL LOW (ref >60)
GFR calc non Af Amer: 7 mL/min — ABNORMAL LOW (ref >60)
Glucose, Bld: 91 mg/dL (ref 70–99)
Phosphorus: 3 mg/dL (ref 2.5–4.6)
Potassium: 3.7 mmol/L (ref 3.5–5.1)
Sodium: 134 mmol/L — ABNORMAL LOW (ref 135–145)

## 2020-05-19 LAB — MAGNESIUM: Magnesium: 1.7 mg/dL (ref 1.7–2.4)

## 2020-05-19 NOTE — Progress Notes (Deleted)
Ambulated pt per request, pts heart rate sustained in the 70-80 range. No discomfort walking.  Will continue to monitor

## 2020-05-19 NOTE — Discharge Instructions (Signed)
Indwelling Urinary Catheter Care, Adult An indwelling urinary catheter is a thin tube that is put into your bladder. The tube helps to drain pee (urine) out of your body. The tube goes in through your urethra. Your urethra is where pee comes out of your body. Your pee will come out through the catheter, then it will go into a bag (drainage bag). Take good care of your catheter so it will work well. How to wear your catheter and bag Supplies needed  Sticky tape (adhesive tape) or a leg strap.  Alcohol wipe or soap and water (if you use tape).  A clean towel (if you use tape).  Large overnight bag.  Smaller bag (leg bag). Wearing your catheter Attach your catheter to your leg with tape or a leg strap.  Make sure the catheter is not pulled tight.  If a leg strap gets wet, take it off and put on a dry strap.  If you use tape to hold the bag on your leg: 1. Use an alcohol wipe or soap and water to wash your skin where the tape made it sticky before. 2. Use a clean towel to pat-dry that skin. 3. Use new tape to make the bag stay on your leg. Wearing your bags You should have been given a large overnight bag.  You may wear the overnight bag in the day or night.  Always have the overnight bag lower than your bladder.  Do not let the bag touch the floor.  Before you go to sleep, put a clean plastic bag in a wastebasket. Then hang the overnight bag inside the wastebasket. You should also have a smaller leg bag that fits under your clothes.  Always wear the leg bag below your knee.  Do not wear your leg bag at night. How to care for your skin and catheter Supplies needed  A clean washcloth.  Water and mild soap.  A clean towel. Caring for your skin and catheter      Clean the skin around your catheter every day: 1. Wash your hands with soap and water. 2. Wet a clean washcloth in warm water and mild soap. 3. Clean the skin around your urethra.  If you are  male:  Gently spread the folds of skin around your vagina (labia).  With the washcloth in your other hand, wipe the inner side of your labia on each side. Wipe from front to back.  If you are male:  Pull back any skin that covers the end of your penis (foreskin).  With the washcloth in your other hand, wipe your penis in small circles. Start wiping at the tip of your penis, then move away from the catheter.  Move the foreskin back in place, if needed. 4. With your free hand, hold the catheter close to where it goes into your body.  Keep holding the catheter during cleaning so it does not get pulled out. 5. With the washcloth in your other hand, clean the catheter.  Only wipe downward on the catheter.  Do not wipe upward toward your body. Doing this may push germs into your urethra and cause infection. 6. Use a clean towel to pat-dry the catheter and the skin around it. Make sure to wipe off all soap. 7. Wash your hands with soap and water.  Shower every day. Do not take baths.  Do not use cream, ointment, or lotion on the area where the catheter goes into your body, unless your doctor tells you   to.  Do not use powders, sprays, or lotions on your genital area.  Check your skin around the catheter every day for signs of infection. Check for: ? Redness, swelling, or pain. ? Fluid or blood. ? Warmth. ? Pus or a bad smell. How to empty the bag Supplies needed  Rubbing alcohol.  Gauze pad or cotton ball.  Tape or a leg strap. Emptying the bag Pour the pee out of your bag when it is ?- full, or at least 2-3 times a day. Do this for your overnight bag and your leg bag. 1. Wash your hands with soap and water. 2. Separate (detach) the bag from your leg. 3. Hold the bag over the toilet or a clean pail. Keep the bag lower than your hips and bladder. This is so the pee (urine) does not go back into the tube. 4. Open the pour spout. It is at the bottom of the bag. 5. Empty the  pee into the toilet or pail. Do not let the pour spout touch any surface. 6. Put rubbing alcohol on a gauze pad or cotton ball. 7. Use the gauze pad or cotton ball to clean the pour spout. 8. Close the pour spout. 9. Attach the bag to your leg with tape or a leg strap. 10. Wash your hands with soap and water. Follow instructions for cleaning the drainage bag:  From the product maker.  As told by your doctor. How to change the bag Supplies needed  Alcohol wipes.  A clean bag.  Tape or a leg strap. Changing the bag Replace your bag when it starts to leak, smell bad, or look dirty. 1. Wash your hands with soap and water. 2. Separate the dirty bag from your leg. 3. Pinch the catheter with your fingers so that pee does not spill out. 4. Separate the catheter tube from the bag tube where these tubes connect (at the connection valve). Do not let the tubes touch any surface. 5. Clean the end of the catheter tube with an alcohol wipe. Use a different alcohol wipe to clean the end of the bag tube. 6. Connect the catheter tube to the tube of the clean bag. 7. Attach the clean bag to your leg with tape or a leg strap. Do not make the bag tight on your leg. 8. Wash your hands with soap and water. General rules   Never pull on your catheter. Never try to take it out. Doing that can hurt you.  Always wash your hands before and after you touch your catheter or bag. Use a mild, fragrance-free soap. If you do not have soap and water, use hand sanitizer.  Always make sure there are no twists or bends (kinks) in the catheter tube.  Always make sure there are no leaks in the catheter or bag.  Drink enough fluid to keep your pee pale yellow.  Do not take baths, swim, or use a hot tub.  If you are male, wipe from front to back after you poop (have a bowel movement). Contact a doctor if:  Your pee is cloudy.  Your pee smells worse than usual.  Your catheter gets clogged.  Your catheter  leaks.  Your bladder feels full. Get help right away if:  You have redness, swelling, or pain where the catheter goes into your body.  You have fluid, blood, pus, or a bad smell coming from the area where the catheter goes into your body.  Your skin feels warm where   the catheter goes into your body.  You have a fever.  You have pain in your: ? Belly (abdomen). ? Legs. ? Lower back. ? Bladder.  You see blood in the catheter.  Your pee is pink or red.  You feel sick to your stomach (nauseous).  You throw up (vomit).  You have chills.  Your pee is not draining into the bag.  Your catheter gets pulled out. Summary  An indwelling urinary catheter is a thin tube that is placed into the bladder to help drain pee (urine) out of the body.  The catheter is placed into the part of the body that drains pee from the bladder (urethra).  Taking good care of your catheter will keep it working properly and help prevent problems.  Always wash your hands before and after touching your catheter or bag.  Never pull on your catheter or try to take it out. This information is not intended to replace advice given to you by your health care provider. Make sure you discuss any questions you have with your health care provider. Document Revised: 01/21/2019 Document Reviewed: 05/15/2017 Elsevier Patient Education  2020 [employer] Inc.  

## 2020-05-19 NOTE — Progress Notes (Signed)
Patient ID: Manuel Ortega, male   DOB: Feb 19, 1951, 69 y.o.   MRN: [id number]    Subjective: Hematuria resolved following CBI and placement of hematuria catheter. Urine clear light pink this morning, CBI off all night and yesterday afternoon. Hb stable this morning, 9.1.   Patient would prefer to leave catheter in place for now, is not overly bothered by it.   Objective: Vital signs in last 24 hours: Temp:  [97.9 F (36.6 C)-99.5 F (37.5 C)] 98.2 F (36.8 C) (08/07 0408) Pulse Rate:  [91-105] 97 (08/07 0600) Resp:  [18-20] 20 (08/07 0408) BP: (145-168)/(87-95) 148/91 (08/07 0408) SpO2:  [98 %-100 %] 98 % (08/07 0408) Weight:  [99.2 kg] 99.2 kg (08/07 0411)  Intake/Output from previous day: 08/06 0701 - 08/07 0700 In: 1270 [P.O.:920] Out: 5251 [Urine:5250; Stool:1] Intake/Output this shift: Total I/O In: 240 [P.O.:240] Out: -   Physical Exam:  General: Alert and oriented GU: Urine clear light pink with CBI off  Lab Results: Recent Labs    05/17/20 1630 05/18/20 0836 05/19/20 0418  HGB 8.6* 9.3* 9.1*  HCT 27.1* 29.1* 28.7*   BMET Recent Labs    05/18/20 0500 05/19/20 0418  NA 134* 134*  K 3.5 3.7  CL 101 104  CO2 21* 20*  GLUCOSE 94 91  BUN 50* 54*  CREATININE 7.02* 7.47*  CALCIUM 7.6* 7.8*    Procedure: I irrigated multiple clots from bladder.  Assessment/Plan: 1.  Urinary retention secondary to bladder outlet obstruction: Continue Foley catheter drainage and monitor his renal function.  Renal function slightly increased today. Possible repeat dialysis Monday per Nephrology.   2.  Hematuria: This is likely secondary to decompression of his very distended bladder. This has improved significantly since clot irrigation yesterday, and Hb has been stable. CBI has been off overnight. Recommend continuing with CBI off today, but would leave catheter at this time. Ok to clamp the CBI port so patient can ambulate without irrigation bags.    3.  Elevated PSA: This  is not a useful test in the setting of recent catheterization and urinary retention and is likely to be falsely elevated.  His digital rectal exam is not concerning for prostate cancer.  This can be rechecked in the future as an outpatient.   LOS: 8 days   Carmie Kanner 05/19/2020, 10:48 AM

## 2020-05-19 NOTE — Progress Notes (Signed)
Pt refused foley care , pt educated Will continue to monitor

## 2020-05-19 NOTE — Progress Notes (Signed)
[facility] Progress Note   Subjective/Interval Events  5200 of UOP !!  BUN and crt slightly worse -  Continues to feel better-  Foley changed to a hematuria catheter and doing CBI-  Hgb 9.1 and stable   Objective Vitals:   05/19/20 0036 05/19/20 0408 05/19/20 0411 05/19/20 0600  BP:  (!) 148/91    Pulse: 91 (!) 105  97  Resp:  20    Temp:  98.2 F (36.8 C)    TempSrc:  Oral    SpO2:  98%    Weight:   99.2 kg   Height:       Physical Exam General: nad-  Right sided vascath in place-  Placed 8/1 Heart: RRR, no rub Lungs: clear Abdomen: soft, NABS Extremities: no edema GU: foley draining blood tinged urine but clearing  Neuro:  better   Additional Objective Labs: Basic Metabolic Panel: Recent Labs  Lab 05/17/20 0500 05/18/20 0500 05/19/20 0418  NA 135 134* 134*  K 3.2* 3.5 3.7  CL 99 101 104  CO2 23 21* 20*  GLUCOSE 83 94 91  BUN 38* 50* 54*  CREATININE 6.67* 7.02* 7.47*  CALCIUM 7.2* 7.6* 7.8*  PHOS 2.8 2.5 3.0   Liver Function Tests: Recent Labs  Lab 05/13/20 0730 05/13/20 1455 05/17/20 0500 05/18/20 0500 05/19/20 0418  AST 10*  --   --   --   --   ALT 6  --   --   --   --   ALKPHOS 62  --   --   --   --   BILITOT 1.0  --   --   --   --   PROT 5.9*  --   --   --   --   ALBUMIN 3.0*   < > 2.7* 3.0* 2.8*   < > = values in this interval not displayed.   No results for input(s): LIPASE, AMYLASE in the last 168 hours. CBC: Recent Labs  Lab 05/16/20 0550 05/16/20 1136 05/17/20 0500 05/17/20 0500 05/17/20 1630 05/18/20 0836 05/19/20 0418  WBC 10.8*  --  10.4   < > 13.1* 11.5* 12.2*  NEUTROABS  --   --  6.2  --   --  8.5* 7.4  HGB 7.3*   < > 6.1*   < > 8.6* 9.3* 9.1*  HCT 23.3*   < > 19.0*   < > 27.1* 29.1* 28.7*  MCV 87.9  --  88.4  --  87.4 85.6 88.9  PLT 192  --  167   < > 178 191 211   < > = values in this interval not displayed.   Blood Culture    Component Value Date/Time   SDES  05/11/2020 1805    URINE, CLEAN  CATCH Performed at [facility], [place] [address], [place], [place] [address]    SPECREQUEST  05/11/2020 1805    NONE Performed at [facility], [place] [address], [place], [place] [address]    CULT  05/11/2020 1805    NO GROWTH Performed at [facility] [address], [place], [place] [address]    REPTSTATUS 05/13/2020 FINAL 05/11/2020 1805    Cardiac Enzymes: No results for input(s): CKTOTAL, CKMB, CKMBINDEX, TROPONINI in the last 168 hours. CBG: No results for input(s): GLUCAP in the last 168 hours. Iron Studies:  No results for input(s): IRON, TIBC, TRANSFERRIN, FERRITIN in the last 72 hours. @lablastinr3 @ Studies/Results: No results found.  Medications: . sodium chloride 100 mL/hr at 05/16/20 1428  . sodium chloride irrigation     . calcium carbonate  800 mg of elemental calcium Oral TID  . Chlorhexidine Gluconate Cloth  6 each Topical Q0600  . darbepoetin (ARANESP) injection - NON-DIALYSIS  150 mcg Subcutaneous Q Wed-1800  . docusate sodium  100 mg Oral BID  . feeding supplement (NEPRO CARB STEADY)  237 mL Oral BID BM  . magnesium oxide  400 mg Oral Daily  . multivitamin  1 tablet Oral QHS  . psyllium  1 packet Oral Daily  . tamsulosin  0.8 mg Oral QPC supper   Results for Manuel Ortega, Manuel Ortega (MRN [id number]) as of 05/13/2020 14:16  Ref. Range 05/12/2020 00:30  Prostatic Specific Antigen Latest Ref Range: 0.00 - 4.00 ng/mL 28.60 (H)    Assessment/Plan 1. AKI from obstructive uropathy, uremia present without significant initial improvement with placement of foley.  Did HD on 8/1 for uremia with clinical improvement-  24 hours with good UOP but BUN and crt not better.  second HD treatment on 8/3-  Now seeing BUN and crt rise off of HD-  Suspect will need another HD at some point, possibly Monday, since labs not terrible will not do today-  Too soon to declare what his long term dialysis needs will be-  Cont to monitor  2. Obstructive  uropathy presumed from BPH, severe, now with Foley catheter draining pink blood-tinged urine- urology involved - CBI 3. Elevated PSA from #2, probably, maintain foley-  uro f/u-    4. Hypokalemia, likely due to gastrointestinal losses;  Supplement ordered 5. Hypocalcemia from severity of renal failure, improving 6. Hypomagnesemia from gastrointestinal losses, on supplement 7. Hyperphosphatemia secondary to #1- better 8. Metabolic acidosis and alkalosis, from renal failure and gatric losses + PO NaHCO3 intake; AG down- resolved 9. Depression 10. Normocytic anemia, TSAT 26%- gave IV iron- feraheme times 2 doses -   discovered  to have a hgb under 6-  Got 2 units PRBC on 8/3-  - dosed with ESA as well-- more blood on 8/5? Presumed GU source- hgb better today       Manuel Ortega  05/19/2020, 9:53 AM  [employer]

## 2020-05-19 NOTE — Progress Notes (Signed)
RN and NT offered to do peri/foley care. Education given. Pt refuses

## 2020-05-19 NOTE — Progress Notes (Signed)
PROGRESS NOTE    Manuel Ortega  VFI:[id number] DOB: 23-Jul-1951 DOA: 05/11/2020 PCP: System, Pcp Not In   Brief Narrative:   Manuel Ortega an 69 y.o.malepast medical history significant of depression and hypertension noncompliant with his medication with no primary care doctor for over the last 5 years presented to the [facility] long hospital with nausea vomiting that started 4 days prior to admission. In the ED he was found to have a white count of 13 BUN of 127 creatinine of 18 potassium was 2.6 anion gap of 29 and a bicarb of 23. 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 Nephrology was consulted, as well as IR for tunneled HD catheter.  8/7: In good spirits today. Hgb is stable. Urine looks much better. Per urology: VT in 5 days. Appreciate assistance. Nephro holding on HD. Appreciate assistance.   Assessment & Plan: Acute kidney injury due to obstructive uropathy Uremia N/V - Nausea vomiting likely due to uremic symptoms, now resolved. - IR was consulted and HD tunneled catheter placed on 05/12/2020. - Nephrology onboard. HD for uremia. - His uremic symptoms are clinically improved his nausea is improved as well as his appetite. - 8/7: per nephro; holding on HD for now. UOP is good.  Obstructive uropathy/BPH Hematuria Urinary retention -appreciate urology assistance - continues to have gross hematuria; continue foley, urology to follow up outpt - urology to follow up retention outpt.     - s/p 4 units pRBC; urology has changed cath to hematuria cath     - 8/7: CBI has been stopped; urine looks ok; Urology recs voiding trial in 5 days; appreciate assistance  Syncope - episode while having BM this AM; likely vasovagal - check orthostatics: tachy throughout test, but BP ok. Will get small amount of fluids. -orthostatics ok     - no further episodes  noted  Hypokalemia Hypomagnesemia Hypocalcemia Hyponatremia Secondary hyperphosphatemia -replace per nephro, monitor  Anemia of chronic renal disease vomiting: Fe-deficiency Acute blood loss anemia - Fe mildly low, s/p IV Fesupplement     - blood loss from hematuria     - s/p 4 units pRBC;     - Hgb stable  Leukocytosis: - likely reactive;mildly elevated, follow  Elevated lipase - Likely due to nausea and vomiting he has no pancreatitis. - Tolerating his diet well.  DVT prophylaxis: SCDs Code Status: FULL Family Communication: None at bedside   Status is: Inpatient  Remains inpatient appropriate because:Inpatient level of care appropriate due to severity of illness   Dispo: The patient is from: Home              Anticipated d/c is to: Home              Anticipated d/c date is: > 3 days              Patient currently is not medically stable to d/c.  Consultants:   Nephrology  Urology  ROS:  Denies CP, N, V, ab pain. Remainder ROS is negative for all not previously mentioned.  Subjective: "They took me of off this bag."  Objective: Vitals:   05/19/20 0036 05/19/20 0408 05/19/20 0411 05/19/20 0600  BP:  (!) 148/91    Pulse: 91 (!) 105  97  Resp:  20    Temp:  98.2 F (36.8 C)    TempSrc:  Oral    SpO2:  98%    Weight:   99.2 kg   Height:  Intake/Output Summary (Last 24 hours) at 05/19/2020 1308 Last data filed at 05/19/2020 1308 Gross per 24 hour  Intake 560 ml  Output 2251 ml  Net -1691 ml   Filed Weights   05/17/20 0500 05/18/20 0324 05/19/20 0411  Weight: 98.6 kg 99.6 kg 99.2 kg   Examination:  General: 69 y.o. male resting in bed in NAD Cardiovascular: RRR, +S1, S2, no m/g/r Respiratory: CTABL, no w/r/r, normal WOB GI: BS+, NDNT, no masses noted, no organomegaly noted MSK: No e/c/c Neuro: A&O x 3, no focal deficits Psyc: Appropriate interaction and affect, calm/cooperative  Data Reviewed: I have  personally reviewed following labs and imaging studies.  CBC: Recent Labs  Lab 05/16/20 0550 05/16/20 0550 05/16/20 1136 05/17/20 0500 05/17/20 1630 05/18/20 0836 05/19/20 0418  WBC 10.8*  --   --  10.4 13.1* 11.5* 12.2*  NEUTROABS  --   --   --  6.2  --  8.5* 7.4  HGB 7.3*   < > 7.9* 6.1* 8.6* 9.3* 9.1*  HCT 23.3*   < > 24.8* 19.0* 27.1* 29.1* 28.7*  MCV 87.9  --   --  88.4 87.4 85.6 88.9  PLT 192  --   --  167 178 191 211   < > = values in this interval not displayed.   Basic Metabolic Panel: Recent Labs  Lab 05/15/20 0640 05/16/20 0550 05/16/20 1136 05/17/20 0500 05/18/20 0500 05/19/20 0418  NA 133* 136  --  135 134* 134*  K 3.5 3.4*  --  3.2* 3.5 3.7  CL 100 100  --  99 101 104  CO2 22 26  --  23 21* 20*  GLUCOSE 91 96  --  83 94 91  BUN 70* 34*  --  38* 50* 54*  CREATININE 10.34* 6.28*  --  6.67* 7.02* 7.47*  CALCIUM 7.1* 7.1*  --  7.2* 7.6* 7.8*  MG  --   --  1.7 1.6*  --  1.7  PHOS 4.8* 3.0  --  2.8 2.5 3.0   GFR: Estimated Creatinine Clearance: 11.7 mL/min (A) (by C-G formula based on SCr of 7.47 mg/dL (H)). Liver Function Tests: Recent Labs  Lab 05/13/20 0730 05/13/20 1455 05/15/20 0640 05/16/20 0550 05/17/20 0500 05/18/20 0500 05/19/20 0418  AST 10*  --   --   --   --   --   --   ALT 6  --   --   --   --   --   --   ALKPHOS 62  --   --   --   --   --   --   BILITOT 1.0  --   --   --   --   --   --   PROT 5.9*  --   --   --   --   --   --   ALBUMIN 3.0*   < > 2.9* 2.7* 2.7* 3.0* 2.8*   < > = values in this interval not displayed.   No results for input(s): LIPASE, AMYLASE in the last 168 hours. No results for input(s): AMMONIA in the last 168 hours. Coagulation Profile: No results for input(s): INR, PROTIME in the last 168 hours. Cardiac Enzymes: No results for input(s): CKTOTAL, CKMB, CKMBINDEX, TROPONINI in the last 168 hours. BNP (last 3 results) No results for input(s): PROBNP in the last 8760 hours. HbA1C: No results for input(s):  HGBA1C in the last 72 hours. CBG: No results for input(s): GLUCAP  in the last 168 hours. Lipid Profile: No results for input(s): CHOL, HDL, LDLCALC, TRIG, CHOLHDL, LDLDIRECT in the last 72 hours. Thyroid Function Tests: No results for input(s): TSH, T4TOTAL, FREET4, T3FREE, THYROIDAB in the last 72 hours. Anemia Panel: No results for input(s): VITAMINB12, FOLATE, FERRITIN, TIBC, IRON, RETICCTPCT in the last 72 hours. Sepsis Labs: No results for input(s): PROCALCITON, LATICACIDVEN in the last 168 hours.  Recent Results (from the past 240 hour(s))  SARS Coronavirus 2 by RT PCR (hospital order, performed in [facility] hospital lab) Nasopharyngeal Nasopharyngeal Swab     Status: None   Collection Time: 05/11/20  5:41 PM   Specimen: Nasopharyngeal Swab  Result Value Ref Range Status   SARS Coronavirus 2 NEGATIVE NEGATIVE Final    Comment: (NOTE) SARS-CoV-2 target nucleic acids are NOT DETECTED.  The SARS-CoV-2 RNA is generally detectable in upper and lower respiratory specimens during the acute phase of infection. The lowest concentration of SARS-CoV-2 viral copies this assay can detect is 250 copies / mL. A negative result does not preclude SARS-CoV-2 infection and should not be used as the sole basis for treatment or other patient management decisions.  A negative result may occur with improper specimen collection / handling, submission of specimen other than nasopharyngeal swab, presence of viral mutation(s) within the areas targeted by this assay, and inadequate number of viral copies (<250 copies / mL). A negative result must be combined with clinical observations, patient history, and epidemiological information.  Fact Sheet for Patients:   StrictlyIdeas.no  Fact Sheet for Healthcare Providers: BankingDealers.co.za  This test is not yet approved or  cleared by the [place] FDA and has been authorized for detection and/or  diagnosis of SARS-CoV-2 by FDA under an Emergency Use Authorization (EUA).  This EUA will remain in effect (meaning this test can be used) for the duration of the COVID-19 declaration under Section 564(b)(1) of the Act, 21 U.S.C. section 360bbb-3(b)(1), unless the authorization is terminated or revoked sooner.  Performed at [facility], [place] [address] [place], [place] [address]   Culture, Urine     Status: None   Collection Time: 05/11/20  6:05 PM   Specimen: Urine, Clean Catch  Result Value Ref Range Status   Specimen Description   Final    URINE, CLEAN CATCH Performed at [facility], [place] [address] [place], [place] [address]    Special Requests   Final    NONE Performed at [facility], [place] [address], [place], [place] [address]    Culture   Final    NO GROWTH Performed at [facility], [place] [address], [place], [place] [address]    Report Status 05/13/2020 FINAL  Final      Radiology Studies: No results found.   Scheduled Meds: . calcium carbonate  800 mg of elemental calcium Oral TID  . Chlorhexidine Gluconate Cloth  6 each Topical Q0600  . darbepoetin (ARANESP) injection - NON-DIALYSIS  150 mcg Subcutaneous Q Wed-1800  . docusate sodium  100 mg Oral BID  . feeding supplement (NEPRO CARB STEADY)  237 mL Oral BID BM  . magnesium oxide  400 mg Oral Daily  . multivitamin  1 tablet Oral QHS  . psyllium  1 packet Oral Daily  . tamsulosin  0.8 mg Oral QPC supper   Continuous Infusions: . sodium chloride 100 mL/hr at 05/16/20 1428  . sodium chloride irrigation       LOS: 8 days  Time spent: 25 minutes spent in the coordination of care today.    Jonnie Finner, DO [facility]  If 7PM-7AM, please contact night-coverage [email or web address] 05/19/2020, 1:08 PM

## 2020-05-20 ENCOUNTER — Inpatient Hospital Stay (HOSPITAL_COMMUNITY): Payer: Medicare Other

## 2020-05-20 LAB — CBC WITH DIFFERENTIAL/PLATELET
Abs Immature Granulocytes: 0.33 10*3/uL — ABNORMAL HIGH (ref 0.00–0.07)
Basophils Absolute: 0.1 10*3/uL (ref 0.0–0.1)
Basophils Relative: 1 %
Eosinophils Absolute: 0.2 10*3/uL (ref 0.0–0.5)
Eosinophils Relative: 2 %
HCT: 30.5 % — ABNORMAL LOW (ref 39.0–52.0)
Hemoglobin: 9.7 g/dL — ABNORMAL LOW (ref 13.0–17.0)
Immature Granulocytes: 3 %
Lymphocytes Relative: 24 %
Lymphs Abs: 3.1 10*3/uL (ref 0.7–4.0)
MCH: 28.7 pg (ref 26.0–34.0)
MCHC: 31.8 g/dL (ref 30.0–36.0)
MCV: 90.2 fL (ref 80.0–100.0)
Monocytes Absolute: 1.3 10*3/uL — ABNORMAL HIGH (ref 0.1–1.0)
Monocytes Relative: 10 %
Neutro Abs: 8 10*3/uL — ABNORMAL HIGH (ref 1.7–7.7)
Neutrophils Relative %: 60 %
Platelets: 232 10*3/uL (ref 150–400)
RBC: 3.38 MIL/uL — ABNORMAL LOW (ref 4.22–5.81)
RDW: 14.8 % (ref 11.5–15.5)
WBC: 12.9 10*3/uL — ABNORMAL HIGH (ref 4.0–10.5)
nRBC: 0.2 % (ref 0.0–0.2)

## 2020-05-20 LAB — RENAL FUNCTION PANEL
Albumin: 3.1 g/dL — ABNORMAL LOW (ref 3.5–5.0)
Anion gap: 12 (ref 5–15)
BUN: 64 mg/dL — ABNORMAL HIGH (ref 8–23)
CO2: 19 mmol/L — ABNORMAL LOW (ref 22–32)
Calcium: 8.3 mg/dL — ABNORMAL LOW (ref 8.9–10.3)
Chloride: 104 mmol/L (ref 98–111)
Creatinine, Ser: 7.6 mg/dL — ABNORMAL HIGH (ref 0.61–1.24)
GFR calc Af Amer: 8 mL/min — ABNORMAL LOW (ref >60)
GFR calc non Af Amer: 7 mL/min — ABNORMAL LOW (ref >60)
Glucose, Bld: 99 mg/dL (ref 70–99)
Phosphorus: 2.7 mg/dL (ref 2.5–4.6)
Potassium: 3.8 mmol/L (ref 3.5–5.1)
Sodium: 135 mmol/L (ref 135–145)

## 2020-05-20 LAB — MAGNESIUM: Magnesium: 1.8 mg/dL (ref 1.7–2.4)

## 2020-05-20 MED ORDER — QUETIAPINE FUMARATE 100 MG PO TABS: 200.0000 mg | ORAL_TABLET | Freq: Every day | ORAL | Status: DC

## 2020-05-20 MED ORDER — QUETIAPINE FUMARATE 100 MG PO TABS
200.0000 mg | ORAL_TABLET | Freq: Every day | ORAL | Status: DC
  Administered 2020-05-23 – 2020-05-31 (×9): 200 mg via ORAL
  Filled 2020-05-20 (×10): qty 2

## 2020-05-20 MED ORDER — LORAZEPAM 0.5 MG PO TABS
0.5000 mg | ORAL_TABLET | Freq: Once | ORAL | Status: AC | PRN
  Administered 2020-05-20: 0.5 mg via ORAL
  Filled 2020-05-20: qty 1

## 2020-05-20 MED ORDER — QUETIAPINE FUMARATE 100 MG PO TABS
100.0000 mg | ORAL_TABLET | Freq: Every day | ORAL | Status: AC
  Administered 2020-05-20 – 2020-05-22 (×3): 100 mg via ORAL
  Filled 2020-05-20 (×3): qty 1

## 2020-05-20 MED ORDER — MELATONIN 5 MG PO TABS
5.0000 mg | ORAL_TABLET | Freq: Every day | ORAL | Status: DC
  Administered 2020-05-20 – 2020-05-31 (×12): 5 mg via ORAL
  Filled 2020-05-20 (×12): qty 1

## 2020-05-20 MED ORDER — DIVALPROEX SODIUM 125 MG PO DR TAB
125.0000 mg | DELAYED_RELEASE_TABLET | Freq: Two times a day (BID) | ORAL | Status: DC
  Administered 2020-05-20 – 2020-06-01 (×25): 125 mg via ORAL
  Filled 2020-05-20 (×26): qty 1

## 2020-05-20 MED ORDER — LATANOPROST 0.005 % OP SOLN
1.0000 [drp] | Freq: Every day | OPHTHALMIC | Status: DC
  Administered 2020-05-20 – 2020-05-31 (×12): 1 [drp] via OPHTHALMIC
  Filled 2020-05-20: qty 2.5

## 2020-05-20 MED ORDER — CHLORHEXIDINE GLUCONATE CLOTH 2 % EX PADS
6.0000 | MEDICATED_PAD | Freq: Every day | CUTANEOUS | Status: DC
  Administered 2020-05-20 – 2020-06-01 (×7): 6 via TOPICAL

## 2020-05-20 NOTE — Progress Notes (Signed)
PROGRESS NOTE    Manuel Ortega  JSE:[id number] DOB: 1950/12/06 DOA: 05/11/2020 PCP: System, Pcp Not In   Brief Narrative:   Manuel Ortega an 69 y.o.malepast medical history significant of depression and hypertension noncompliant with his medication with no primary care doctor for over the last 5 years presented to the [facility] long hospital with nausea vomiting that started 4 days prior to admission. In the ED he was found to have a white count of 13 BUN of 127 creatinine of 18 potassium was 2.6 anion gap of 29 and a bicarb of 23. 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 Nephrology was consulted, as well as IR for tunneled HD catheter.  8/8: Scr is holding right now. Will go for HD tomorrow. Hgb is stable. Urology to order renal US. Appreciate assistance.    Assessment & Plan: Acute kidney injury due to obstructive uropathy Uremia N/V - Nausea vomiting likely due to uremic symptoms, now resolved. - IR was consulted and HD tunneled catheter placed on 05/12/2020. - Nephrology onboard. HD for uremia. - His uremic symptoms are clinically improved his nausea is improved as well as his appetite. -8/8: going for HD tomorrow; appreciate nephro assistance  Obstructive uropathy/BPH Hematuria Urinary retention -appreciate urology assistance - continues to have gross hematuria; continue foley, urology to follow up outpt - urology to follow up retention outpt. - s/p 4 units pRBC; urologyhas changed cath to hematuria cath     - 88: No further bleed noted; renal US ordered, appreciate uro assistance  Syncope - episode while having BM this AM; likely vasovagal - check orthostatics: tachy throughout test, but BP ok. Will get small amount of fluids. -orthostatics ok - no further episodes noted  Hypokalemia Hypomagnesemia Hypocalcemia Hyponatremia Secondary hyperphosphatemia -replaceper  nephro, monitor  Anemia of chronic renal disease vomiting: Fe-deficiency Acute blood loss anemia - Fe mildly low, s/p IV Fesupplement - blood loss from hematuria - s/p 4 units pRBC;     - Hgb is stable, resolved  Leukocytosis: - likely reactive;mildly elevated, follow  Elevated lipase - Likely due to nausea and vomiting he has no pancreatitis. - Tolerating his diet well.  DVT prophylaxis: SCDs Code Status: FULL Family Communication: None at bedside.   Status is: Inpatient  Remains inpatient appropriate because:Inpatient level of care appropriate due to severity of illness   Dispo: The patient is from: Home              Anticipated d/c is to: Home              Anticipated d/c date is: > 3 days              Patient currently is not medically stable to d/c.  Consultants:   Nephrology  Urology  ROS:  Denies dyspnea, N, V, D, ab pain, CP . Remainder ROS is negative for all not previously mentioned.  Subjective: "I had some difficulty sleeping last night."  Objective: Vitals:   05/20/20 0041 05/20/20 0401 05/20/20 0815 05/20/20 1234  BP: (!) 147/99 (!) 141/83 136/73 122/84  Pulse: 99 (!) 103 (!) 109 (!) 110  Resp: 18 18 18 18   Temp: 98.5 F (36.9 C) 98.8 F (37.1 C) 98.5 F (36.9 C) 98.7 F (37.1 C)  TempSrc: Oral Oral Oral Oral  SpO2: 99% 99% 99% 98%  Weight: 100.8 kg     Height:        Intake/Output Summary (Last 24 hours) at 05/20/2020 1523 Last data filed  at 05/20/2020 1507 Gross per 24 hour  Intake 480 ml  Output 1900 ml  Net -1420 ml   Filed Weights   05/18/20 0324 05/19/20 0411 05/20/20 0041  Weight: 99.6 kg 99.2 kg 100.8 kg    Examination:  General: 69 y.o. male resting in bed in NAD Cardiovascular: RRR, +S1, S2, no m/g/r, equal pulses throughout Respiratory: CTABL, no w/r/r, normal WOB GI: BS+, NDNT, no masses noted, no organomegaly noted MSK: No e/c/c Neuro: A&O x 3, no focal deficits Psyc: Appropriate  interaction and affect, calm/cooperative   Data Reviewed: I have personally reviewed following labs and imaging studies.  CBC: Recent Labs  Lab 05/17/20 0500 05/17/20 1630 05/18/20 0836 05/19/20 0418 05/20/20 0358  WBC 10.4 13.1* 11.5* 12.2* 12.9*  NEUTROABS 6.2  --  8.5* 7.4 8.0*  HGB 6.1* 8.6* 9.3* 9.1* 9.7*  HCT 19.0* 27.1* 29.1* 28.7* 30.5*  MCV 88.4 87.4 85.6 88.9 90.2  PLT 167 178 191 211 093   Basic Metabolic Panel: Recent Labs  Lab 05/16/20 0550 05/16/20 1136 05/17/20 0500 05/18/20 0500 05/19/20 0418 05/20/20 0358  NA 136  --  135 134* 134* 135  K 3.4*  --  3.2* 3.5 3.7 3.8  CL 100  --  99 101 104 104  CO2 26  --  23 21* 20* 19*  GLUCOSE 96  --  83 94 91 99  BUN 34*  --  38* 50* 54* 64*  CREATININE 6.28*  --  6.67* 7.02* 7.47* 7.60*  CALCIUM 7.1*  --  7.2* 7.6* 7.8* 8.3*  MG  --  1.7 1.6*  --  1.7 1.8  PHOS 3.0  --  2.8 2.5 3.0 2.7   GFR: Estimated Creatinine Clearance: 11.6 mL/min (A) (by C-G formula based on SCr of 7.6 mg/dL (H)). Liver Function Tests: Recent Labs  Lab 05/16/20 0550 05/17/20 0500 05/18/20 0500 05/19/20 0418 05/20/20 0358  ALBUMIN 2.7* 2.7* 3.0* 2.8* 3.1*   No results for input(s): LIPASE, AMYLASE in the last 168 hours. No results for input(s): AMMONIA in the last 168 hours. Coagulation Profile: No results for input(s): INR, PROTIME in the last 168 hours. Cardiac Enzymes: No results for input(s): CKTOTAL, CKMB, CKMBINDEX, TROPONINI in the last 168 hours. BNP (last 3 results) No results for input(s): PROBNP in the last 8760 hours. HbA1C: No results for input(s): HGBA1C in the last 72 hours. CBG: No results for input(s): GLUCAP in the last 168 hours. Lipid Profile: No results for input(s): CHOL, HDL, LDLCALC, TRIG, CHOLHDL, LDLDIRECT in the last 72 hours. Thyroid Function Tests: No results for input(s): TSH, T4TOTAL, FREET4, T3FREE, THYROIDAB in the last 72 hours. Anemia Panel: No results for input(s): VITAMINB12, FOLATE,  FERRITIN, TIBC, IRON, RETICCTPCT in the last 72 hours. Sepsis Labs: No results for input(s): PROCALCITON, LATICACIDVEN in the last 168 hours.  Recent Results (from the past 240 hour(s))  SARS Coronavirus 2 by RT PCR (hospital order, performed in [facility] hospital lab) Nasopharyngeal Nasopharyngeal Swab     Status: None   Collection Time: 05/11/20  5:41 PM   Specimen: Nasopharyngeal Swab  Result Value Ref Range Status   SARS Coronavirus 2 NEGATIVE NEGATIVE Final    Comment: (NOTE) SARS-CoV-2 target nucleic acids are NOT DETECTED.  The SARS-CoV-2 RNA is generally detectable in upper and lower respiratory specimens during the acute phase of infection. The lowest concentration of SARS-CoV-2 viral copies this assay can detect is 250 copies / mL. A negative result does not preclude SARS-CoV-2 infection and  should not be used as the sole basis for treatment or other patient management decisions.  A negative result may occur with improper specimen collection / handling, submission of specimen other than nasopharyngeal swab, presence of viral mutation(s) within the areas targeted by this assay, and inadequate number of viral copies (<250 copies / mL). A negative result must be combined with clinical observations, patient history, and epidemiological information.  Fact Sheet for Patients:   StrictlyIdeas.no  Fact Sheet for Healthcare Providers: BankingDealers.co.za  This test is not yet approved or  cleared by the [place] FDA and has been authorized for detection and/or diagnosis of SARS-CoV-2 by FDA under an Emergency Use Authorization (EUA).  This EUA will remain in effect (meaning this test can be used) for the duration of the COVID-19 declaration under Section 564(b)(1) of the Act, 21 U.S.C. section 360bbb-3(b)(1), unless the authorization is terminated or revoked sooner.  Performed at [facility], [place]  [address] [place], [place] [address]   Culture, Urine     Status: None   Collection Time: 05/11/20  6:05 PM   Specimen: Urine, Clean Catch  Result Value Ref Range Status   Specimen Description   Final    URINE, CLEAN CATCH Performed at [facility], [place] [address] [place], [place] [address]    Special Requests   Final    NONE Performed at [facility], [place] [address] [place], [place] [address]    Culture   Final    NO GROWTH Performed at [facility], [place] [address] [place], [place] [address]    Report Status 05/13/2020 FINAL  Final      Radiology Studies: No results found.   Scheduled Meds: . calcium carbonate  800 mg of elemental calcium Oral TID  . Chlorhexidine Gluconate Cloth  6 each Topical Q0600  . Chlorhexidine Gluconate Cloth  6 each Topical Q0600  . darbepoetin (ARANESP) injection - NON-DIALYSIS  150 mcg Subcutaneous Q Wed-1800  . divalproex  125 mg Oral BID  . docusate sodium  100 mg Oral BID  . feeding supplement (NEPRO CARB STEADY)  237 mL Oral BID BM  . latanoprost  1 drop Both Eyes QHS  . magnesium oxide  400 mg Oral Daily  . melatonin  5 mg Oral QHS  . multivitamin  1 tablet Oral QHS  . psyllium  1 packet Oral Daily  . QUEtiapine  100 mg Oral QHS   Followed by  . [START ON 05/23/2020] QUEtiapine  200 mg Oral QHS  . tamsulosin  0.8 mg Oral QPC supper   Continuous Infusions: . sodium chloride 100 mL/hr at 05/16/20 1428  . sodium chloride irrigation       LOS: 9 days    Time spent: 25 minutes spent in the coordination of care today.    Jonnie Finner, DO [facility]  If 7PM-7AM, please contact night-coverage [email or web address] 05/20/2020, 3:23 PM

## 2020-05-20 NOTE — Progress Notes (Addendum)
Patient ID: Manuel Ortega, male   DOB: 1951-02-15, 69 y.o.   MRN: [id number]    Subjective: Patient reports difficulty falling asleep last night secondary to anxiety.   Urine clear yellow this morning, CBI off for past 2 days. Patient would prefer no manipulation of his catheter.   Creatinine remains elevated at 7.6 Hb stable at 9.7 1.05L urine output for past 24 hours  Objective: Vital signs in last 24 hours: Temp:  [97.7 F (36.5 C)-98.8 F (37.1 C)] 98.5 F (36.9 C) (08/08 0815) Pulse Rate:  [97-111] 109 (08/08 0815) Resp:  [18] 18 (08/08 0815) BP: (136-147)/(73-99) 136/73 (08/08 0815) SpO2:  [99 %-100 %] 99 % (08/08 0815) Weight:  [100.8 kg] 100.8 kg (08/08 0041)  Intake/Output from previous day: 08/07 0701 - 08/08 0700 In: 720 [P.O.:720] Out: 1050 [Urine:1050] Intake/Output this shift: No intake/output data recorded.  Physical Exam:  General: Alert and oriented GU: Urine clear yellow with CBI off  Lab Results: Recent Labs    05/18/20 0836 05/19/20 0418 05/20/20 0358  HGB 9.3* 9.1* 9.7*  HCT 29.1* 28.7* 30.5*   BMET Recent Labs    05/19/20 0418 05/20/20 0358  NA 134* 135  K 3.7 3.8  CL 104 104  CO2 20* 19*  GLUCOSE 91 99  BUN 54* 64*  CREATININE 7.47* 7.60*  CALCIUM 7.8* 8.3*    Assessment/Plan: 1.  Urinary retention secondary to bladder outlet obstruction, acute renal failure: Continue foley catheter while awaiting for creatinine to normalize. Recommend repeat RUS to evaluate for persistent hydronephrosis.   2.  Hematuria: Resolved, no further need for CBI  3.  Elevated PSA: Will plan to recheck as outpatient after resolution of acute urinary retention   LOS: 9 days   Carmie Kanner 05/20/2020, 9:27 AM

## 2020-05-20 NOTE — Progress Notes (Signed)
[facility] Progress Note   Subjective/Interval Events  1000 of UOP  BUN and crt slightly worse -  Continues to feel better-  Foley changed to a hematuria catheter and doing some CBI-  Hgb 9.7 and actually improved  Objective Vitals:   05/19/20 2118 05/20/20 0041 05/20/20 0401 05/20/20 0815  BP: (!) 143/82 (!) 147/99 (!) 141/83 136/73  Pulse: 97 99 (!) 103 (!) 109  Resp: 18 18 18 18   Temp: 97.7 F (36.5 C) 98.5 F (36.9 C) 98.8 F (37.1 C) 98.5 F (36.9 C)  TempSrc: Oral Oral Oral Oral  SpO2: 100% 99% 99% 99%  Weight:  100.8 kg    Height:       Physical Exam General: nad-  Right sided vascath in place-  Placed 8/1 Heart: RRR, no rub Lungs: clear Abdomen: soft, NABS Extremities: no edema GU: foley draining blood tinged urine but clearing  Neuro:  better   Additional Objective Labs: Basic Metabolic Panel: Recent Labs  Lab 05/18/20 0500 05/19/20 0418 05/20/20 0358  NA 134* 134* 135  K 3.5 3.7 3.8  CL 101 104 104  CO2 21* 20* 19*  GLUCOSE 94 91 99  BUN 50* 54* 64*  CREATININE 7.02* 7.47* 7.60*  CALCIUM 7.6* 7.8* 8.3*  PHOS 2.5 3.0 2.7   Liver Function Tests: Recent Labs  Lab 05/18/20 0500 05/19/20 0418 05/20/20 0358  ALBUMIN 3.0* 2.8* 3.1*   No results for input(s): LIPASE, AMYLASE in the last 168 hours. CBC: Recent Labs  Lab 05/17/20 0500 05/17/20 0500 05/17/20 1630 05/17/20 1630 05/18/20 0836 05/19/20 0418 05/20/20 0358  WBC 10.4   < > 13.1*   < > 11.5* 12.2* 12.9*  NEUTROABS 6.2   < >  --   --  8.5* 7.4 8.0*  HGB 6.1*   < > 8.6*   < > 9.3* 9.1* 9.7*  HCT 19.0*   < > 27.1*   < > 29.1* 28.7* 30.5*  MCV 88.4  --  87.4  --  85.6 88.9 90.2  PLT 167   < > 178   < > 191 211 232   < > = values in this interval not displayed.   Blood Culture    Component Value Date/Time   SDES  05/11/2020 1805    URINE, CLEAN CATCH Performed at [facility], [place] [address] [place], [place] [address]    SPECREQUEST   05/11/2020 1805    NONE Performed at [facility], [place] [address], [place], [place] [address]    CULT  05/11/2020 1805    NO GROWTH Performed at [place] [address], [place], [place] [address]    REPTSTATUS 05/13/2020 FINAL 05/11/2020 1805    Cardiac Enzymes: No results for input(s): CKTOTAL, CKMB, CKMBINDEX, TROPONINI in the last 168 hours. CBG: No results for input(s): GLUCAP in the last 168 hours. Iron Studies:  No results for input(s): IRON, TIBC, TRANSFERRIN, FERRITIN in the last 72 hours. @lablastinr3 @ Studies/Results: No results found. Medications: . sodium chloride 100 mL/hr at 05/16/20 1428  . sodium chloride irrigation     . calcium carbonate  800 mg of elemental calcium Oral TID  . Chlorhexidine Gluconate Cloth  6 each Topical Q0600  . darbepoetin (ARANESP) injection - NON-DIALYSIS  150 mcg Subcutaneous Q Wed-1800  . docusate sodium  100 mg Oral BID  . feeding supplement (NEPRO CARB STEADY)  237 mL Oral BID BM  . magnesium oxide  400 mg Oral  Daily  . melatonin  5 mg Oral QHS  . multivitamin  1 tablet Oral QHS  . psyllium  1 packet Oral Daily  . tamsulosin  0.8 mg Oral QPC supper   Results for KAYO, ZION (MRN [id number]) as of 05/13/2020 14:16  Ref. Range 05/12/2020 00:30  Prostatic Specific Antigen Latest Ref Range: 0.00 - 4.00 ng/mL 28.60 (H)    Assessment/Plan 1. AKI from obstructive uropathy, uremia present without significant initial improvement with placement of foley.  Did HD on 8/1 for uremia with clinical improvement.  second HD treatment on 8/3-  Now seeing BUN and crt rise off of HD-  crt too high to consider obs as OP at this point. will order HD for tomorrow and cont to watch-  Too soon to declare what his long term dialysis needs will be- but if needing this week still would change vascath to [facility] and try to arrange OP HD for AKI-  2. Obstructive uropathy presumed from BPH, severe, now with Foley catheter draining  pink blood-tinged urine- urology involved - CBI 3. Elevated PSA from #2, probably, maintain foley-  uro f/u-    4. Hypokalemia, likely due to gastrointestinal losses;  Supplement PRN-  High K bath  5. Hypocalcemia from severity of renal failure, improving 6. Hypomagnesemia from gastrointestinal losses, on supplement 7. Hyperphosphatemia secondary to #1- better 8. Metabolic acidosis and alkalosis, from renal failure and gatric losses + PO NaHCO3 intake; AG down- resolved 9. Depression 10. Normocytic anemia, TSAT 26%- gave IV iron- feraheme times 2 doses -   discovered  to have a hgb under 6-  Got 2 units PRBC on 8/3-  - dosed with ESA as well-- more blood on 8/5.  Presumed GU source- hgb better today       Manuel Ortega  05/20/2020, 9:18 AM  [employer]

## 2020-05-21 DIAGNOSIS — N139 Obstructive and reflux uropathy, unspecified: Secondary | ICD-10-CM

## 2020-05-21 DIAGNOSIS — R338 Other retention of urine: Secondary | ICD-10-CM

## 2020-05-21 DIAGNOSIS — R55 Syncope and collapse: Secondary | ICD-10-CM

## 2020-05-21 LAB — RENAL FUNCTION PANEL
Albumin: 2.8 g/dL — ABNORMAL LOW (ref 3.5–5.0)
Anion gap: 12 (ref 5–15)
BUN: 70 mg/dL — ABNORMAL HIGH (ref 8–23)
CO2: 19 mmol/L — ABNORMAL LOW (ref 22–32)
Calcium: 8 mg/dL — ABNORMAL LOW (ref 8.9–10.3)
Chloride: 105 mmol/L (ref 98–111)
Creatinine, Ser: 8.29 mg/dL — ABNORMAL HIGH (ref 0.61–1.24)
GFR calc Af Amer: 7 mL/min — ABNORMAL LOW (ref >60)
GFR calc non Af Amer: 6 mL/min — ABNORMAL LOW (ref >60)
Glucose, Bld: 88 mg/dL (ref 70–99)
Phosphorus: 4.2 mg/dL (ref 2.5–4.6)
Potassium: 3.8 mmol/L (ref 3.5–5.1)
Sodium: 136 mmol/L (ref 135–145)

## 2020-05-21 LAB — CBC WITH DIFFERENTIAL/PLATELET
Abs Immature Granulocytes: 0.21 10*3/uL — ABNORMAL HIGH (ref 0.00–0.07)
Basophils Absolute: 0.1 10*3/uL (ref 0.0–0.1)
Basophils Relative: 1 %
Eosinophils Absolute: 0.2 10*3/uL (ref 0.0–0.5)
Eosinophils Relative: 2 %
HCT: 29.1 % — ABNORMAL LOW (ref 39.0–52.0)
Hemoglobin: 9 g/dL — ABNORMAL LOW (ref 13.0–17.0)
Immature Granulocytes: 2 %
Lymphocytes Relative: 25 %
Lymphs Abs: 2.9 10*3/uL (ref 0.7–4.0)
MCH: 28.4 pg (ref 26.0–34.0)
MCHC: 30.9 g/dL (ref 30.0–36.0)
MCV: 91.8 fL (ref 80.0–100.0)
Monocytes Absolute: 1 10*3/uL (ref 0.1–1.0)
Monocytes Relative: 9 %
Neutro Abs: 7.2 10*3/uL (ref 1.7–7.7)
Neutrophils Relative %: 61 %
Platelets: 226 10*3/uL (ref 150–400)
RBC: 3.17 MIL/uL — ABNORMAL LOW (ref 4.22–5.81)
RDW: 15.6 % — ABNORMAL HIGH (ref 11.5–15.5)
WBC: 11.6 10*3/uL — ABNORMAL HIGH (ref 4.0–10.5)
nRBC: 0 % (ref 0.0–0.2)

## 2020-05-21 LAB — MAGNESIUM: Magnesium: 1.8 mg/dL (ref 1.7–2.4)

## 2020-05-21 NOTE — Progress Notes (Signed)
  Subjective: Events from weekend noted.  Urine has remained clear without a need for restarting CBI.  Hgb has stabilized.  Still requiring dialysis without improvement of renal function.  Objective: Vital signs in last 24 hours: Temp:  [98.1 F (36.7 C)-98.8 F (37.1 C)] 98.1 F (36.7 C) (08/09 0145) Pulse Rate:  [95-110] 109 (08/09 0145) Resp:  [18] 18 (08/08 2055) BP: (122-160)/(73-94) 124/78 (08/09 0145) SpO2:  [98 %-100 %] 98 % (08/09 0145)  Intake/Output from previous day: 08/08 0701 - 08/09 0700 In: 1120 [P.O.:1120] Out: 850 [Urine:850] Intake/Output this shift: Total I/O In: 200 [P.O.:200] Out: -   Physical Exam:  General: Alert and oriented GU: Urine clear in catheter tubing  Lab Results: Recent Labs    05/18/20 0836 05/19/20 0418 05/20/20 0358  HGB 9.3* 9.1* 9.7*  HCT 29.1* 28.7* 30.5*   BMET Recent Labs    05/19/20 0418 05/20/20 0358  NA 134* 135  K 3.7 3.8  CL 104 104  CO2 20* 19*  GLUCOSE 91 99  BUN 54* 64*  CREATININE 7.47* 7.60*  CALCIUM 7.8* 8.3*     Studies/Results: US RENAL  Result Date: 05/20/2020 CLINICAL DATA:  Inpatient.  Follow-up bilateral hydronephrosis. EXAM: RENAL / URINARY TRACT ULTRASOUND COMPLETE COMPARISON:  05/12/2019 CT abdomen/pelvis. FINDINGS: Right Kidney: Renal measurements: 10.3 x 6.6 x 6.2 cm = volume: 221 mL. Mildly echogenic right renal parenchyma. Mild hydronephrosis, improved from recent CT. No renal mass. Left Kidney: Renal measurements: 10.6 x 5.3 x 4.7 cm = volume: 137 mL. Mildly echogenic left renal parenchyma. Two simple upper left renal cysts, largest 3.4 x 3.3 x 2.7 cm in the posterior upper left kidney. Mild hydronephrosis, improved from recent CT. Bladder: Diffusely thick walled bladder collapsed by indwelling Foley catheter and not well evaluated. Other: None. IMPRESSION: 1. Mild bilateral hydronephrosis, improved bilaterally from recent CT. 2. 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 3. Bladder collapsed by indwelling Foley catheter and not well evaluated. Bladder appears diffusely thick walled, nonspecific, probably due to chronic bladder outlet obstruction. Electronically Signed   By: Ilona Sorrel M.D.   On: 05/20/2020 15:44    Assessment/Plan: 1) Urinary retention due to bladder outlet obstruction/renal failure: Continue catheter to drainage.  Renal ultrasound with improvement of hydronephrosis consistent with bladder outlet obstruction as the cause for hydronephrosis on admission.  Will eventually need outpatient follow up with urodynamic evaluation to determine best long term management strategy of bladder outlet obstruction.  Will continue to follow to assess for recovery of renal function but does not appear to be improving thus far.  2) Hematuria: Resolved. CBI off.  Etiology most likely related to bladder decompression after initial catheterization.  Will consider further evaluation as outpatient.    3) Elevated PSA:  Will plan to recheck as outpatient.  Result from hospitalization likely a false positive due to catheterization/retention. DRE is not suspicious for malignancy.   LOS: 10 days   Dutch Gray 05/21/2020, 1:49 AM

## 2020-05-21 NOTE — Progress Notes (Signed)
Pt refused foley care, pt educated. Will monitor.

## 2020-05-21 NOTE — Progress Notes (Signed)
PT Cancellation Note  Patient Details Name: Manuel Ortega MRN: [id number] DOB: 1951-07-23   Cancelled Treatment:    Reason Eval/Treat Not Completed: Other (comment) (Refused due to fatigue after dialysis.)   [name] 05/21/2020, 2:59 PM Taris Galindo W,PT Acute Rehabilitation Services Pager:  [phone]  Office:  [phone]

## 2020-05-21 NOTE — Progress Notes (Signed)
Patient refuses foley care. Provided patient with appropriate wipes and educated on how to perform foley care. He agrees to perform care for himself.

## 2020-05-21 NOTE — Progress Notes (Signed)
Patient left unit before morning shift report to hemodialysis, still currently off of unit.

## 2020-05-21 NOTE — Progress Notes (Signed)
[facility] Progress Note     Assessment/ Plan:   1. AKI from obstructive uropathy,uremia present without significant initial improvement with placement of foley.  Did HD on 8/1 for uremia with clinical improvement.  second HD treatment on 8/3  Seen on HD #3 today 132/83 Net 0 UF 3K bath RIJ temp cath No changes  Too soon to declare what his long term dialysis needs will be- but if needing this week then  would plan on changing vascath to [facility] and try to arrange OP HD for AKI. If obstructive uropathy has been present for a long time then this may be permanent req RRT indefinitely until transplant.  Holding HD tomorrow and will evaluate for e/o recovery with Wed labs.  2. Obstructive uropathy presumed from BPH, severe, now with Foley catheter draining clear now - urology involved - CBI 3. Elevated PSA from #2, probably, maintain foley-  uro f/u-    4. Hypokalemia, likely due to gastrointestinal losses;  Supplement PRN-  High K bath  5. Hypocalcemia from severity of renal failure, improving 6. Hypomagnesemia from gastrointestinal losses, on supplement 7. Hyperphosphatemia secondary to #1- better 8. Metabolic acidosis and alkalosis, from renal failure andgatriclosses+ PO NaHCO3 intake; AG down- resolved 9. Depression 10. Normocytic anemia, TSAT 26%- gave IV iron- feraheme times 2 doses -   discovered  to have a hgb under 6-  Got 2 units PRBC on 8/3-  - dosed with ESA as well-- more blood on 8/5.  Presumed GU source- hgb better today    Subjective:   Feeling better now that he got some sleep. Denies f/c/n/v/dyspnea Has great appetite   Objective:   BP 132/83 (BP Location: Left Arm)    Pulse (!) 112    Temp 98 F (36.7 C) (Axillary)    Resp 16    Ht 6\' 1"  (1.854 m)    Wt 102.6 kg    SpO2 98%    BMI 29.84 kg/m   Intake/Output Summary (Last 24 hours) at 05/21/2020 0941 Last data filed at 05/21/2020 0700 Gross per 24 hour  Intake 920 ml  Output 1425 ml  Net -505 ml    Weight change: 0.7 kg  Physical Exam: General: nad-  Right sided vascath in place-  Placed 8/1 Heart: RRR, no rub Lungs: clear Abdomen: soft, NABS Extremities: no edema GU: foley draining clear now Neuro:  better   Imaging: US RENAL  Result Date: 05/20/2020 CLINICAL DATA:  Inpatient.  Follow-up bilateral hydronephrosis. EXAM: RENAL / URINARY TRACT ULTRASOUND COMPLETE COMPARISON:  05/12/2019 CT abdomen/pelvis. FINDINGS: Right Kidney: Renal measurements: 10.3 x 6.6 x 6.2 cm = volume: 221 mL. Mildly echogenic right renal parenchyma. Mild hydronephrosis, improved from recent CT. No renal mass. Left Kidney: Renal measurements: 10.6 x 5.3 x 4.7 cm = volume: 137 mL. Mildly echogenic left renal parenchyma. Two simple upper left renal cysts, largest 3.4 x 3.3 x 2.7 cm in the posterior upper left kidney. Mild hydronephrosis, improved from recent CT. Bladder: Diffusely thick walled bladder collapsed by indwelling Foley catheter and not well evaluated. Other: None. IMPRESSION: 1. Mild bilateral hydronephrosis, improved bilaterally from recent CT. 2. 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 3. Bladder collapsed by indwelling Foley catheter and not well evaluated. Bladder appears diffusely thick walled, nonspecific, probably due to chronic bladder outlet obstruction. Electronically Signed   By: Ilona Sorrel M.D.   On: 05/20/2020 15:44    Labs: [employer] Recent Labs  Lab 05/15/20 [phone]  05/16/20 0550 05/17/20 0500 05/18/20 0500 05/19/20 0418 05/20/20 0358 05/21/20 0500  NA 133* 136 135 134* 134* 135 136  K 3.5 3.4* 3.2* 3.5 3.7 3.8 3.8  CL 100 100 99 101 104 104 105  CO2 22 26 23  21* 20* 19* 19*  GLUCOSE 91 96 83 94 91 99 88  BUN 70* 34* 38* 50* 54* 64* 70*  CREATININE 10.34* 6.28* 6.67* 7.02* 7.47* 7.60* 8.29*  CALCIUM 7.1* 7.1* 7.2* 7.6* 7.8* 8.3* 8.0*  PHOS 4.8* 3.0 2.8 2.5 3.0 2.7 4.2   CBC Recent Labs  Lab 05/18/20 0836 05/19/20 0418  05/20/20 0358 05/21/20 0500  WBC 11.5* 12.2* 12.9* 11.6*  NEUTROABS 8.5* 7.4 8.0* 7.2  HGB 9.3* 9.1* 9.7* 9.0*  HCT 29.1* 28.7* 30.5* 29.1*  MCV 85.6 88.9 90.2 91.8  PLT 191 211 232 226    Medications:     calcium carbonate  800 mg of elemental calcium Oral TID   Chlorhexidine Gluconate Cloth  6 each Topical Q0600   Chlorhexidine Gluconate Cloth  6 each Topical Q0600   darbepoetin (ARANESP) injection - NON-DIALYSIS  150 mcg Subcutaneous Q Wed-1800   divalproex  125 mg Oral BID   docusate sodium  100 mg Oral BID   feeding supplement (NEPRO CARB STEADY)  237 mL Oral BID BM   latanoprost  1 drop Both Eyes QHS   magnesium oxide  400 mg Oral Daily   melatonin  5 mg Oral QHS   multivitamin  1 tablet Oral QHS   psyllium  1 packet Oral Daily   QUEtiapine  100 mg Oral QHS   Followed by   Derrill Memo ON 05/23/2020] QUEtiapine  200 mg Oral QHS   tamsulosin  0.8 mg Oral QPC supper      Otelia Santee, MD 05/21/2020, 9:41 AM

## 2020-05-21 NOTE — Progress Notes (Signed)
PROGRESS NOTE    Manuel Ortega  VEL:[id number] DOB: 1950/10/15 DOA: 05/11/2020 PCP: System, Pcp Not In   Brief Narrative:   Manuel Ortega an 69 y.o.malepast medical history significant of depression and hypertension noncompliant with his medication with no primary care doctor for over the last 5 years presented to the [facility] long hospital with nausea vomiting that started 4 days prior to admission. In the ED he was found to have a white count of 13 BUN of 127 creatinine of 18 potassium was 2.6 anion gap of 29 and a bicarb of 23. 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 Nephrology was consulted, as well as IR for tunneled HD catheter.  8/9: Seen on HD today. Tolerating. Reports better sleep last night. In better spirits today.   Assessment & Plan: Acute kidney injury due to obstructive uropathy Uremia N/V - Nausea vomiting likely due to uremic symptoms, now resolved. - IR was consulted and HD tunneled catheter placed on 05/12/2020. - Nephrology onboard. HD for uremia. - His uremic symptoms are clinically improved his nausea is improved as well as his appetite. -8/9: seen on HD. Tolerating. Appreciate nephro assistance  Obstructive uropathy/BPH Hematuria Urinary retention -appreciate urology assistance - continues to have gross hematuria; continue foley, urology to follow up outpt - urology to follow up retention outpt. - s/p 4 units pRBC; urologyhas changed cath to hematuria cath - 8/9: No further bleeds noted. Renal US looks improved. Continue foley. Appreciate urology assistance  Syncope - episode while having BM this AM; likely vasovagal - check orthostatics: tachy throughout test, but BP ok. Will get small amount of fluids. -orthostatics ok - no further episodes noted  Hypokalemia Hypomagnesemia Hypocalcemia Hyponatremia Secondary hyperphosphatemia -replaceper  nephro, monitor  Anemia of chronic renal disease vomiting: Fe-deficiency Acute blood loss anemia - Fe mildly low, s/p IV Fesupplement - blood loss from hematuria - s/p 4 units pRBC; - Hgb is stable, resolved  Leukocytosis: - mildly elevated; no fever, follow  Elevated lipase - Likely due to nausea and vomiting he has no pancreatitis. - Tolerating his diet well.  DVT prophylaxis: SCDs Code Status: FULL Family Communication: None at bedside   Status is: Inpatient  Remains inpatient appropriate because:Inpatient level of care appropriate due to severity of illness   Dispo: The patient is from: Home              Anticipated d/c is to: Home              Anticipated d/c date is: > 3 days              Patient currently is not medically stable to d/c.  Consultants:   Urology  Nephrology  ROS:  Denies CP, n, v, dyspnea, ab pain . Remainder ROS is negative for all not previously mentioned.  Subjective: "I slept much better. Thank you."  Objective: Vitals:   05/20/20 2200 05/21/20 0145 05/21/20 0515 05/21/20 0600  BP:  124/78 124/76   Pulse: 95 (!) 109 99   Resp:   18   Temp:  98.1 F (36.7 C) 97.7 F (36.5 C)   TempSrc:  Oral Oral   SpO2:  98% 100%   Weight:    101.5 kg  Height:        Intake/Output Summary (Last 24 hours) at 05/21/2020 0724 Last data filed at 05/21/2020 0700 Gross per 24 hour  Intake 1240 ml  Output 1425 ml  Net -185 ml   [employer]  05/19/20 0411 05/20/20 0041 05/21/20 0600  Weight: 99.2 kg 100.8 kg 101.5 kg    Examination:  General: 69 y.o. male resting in bed in NAD Cardiovascular: RRR, +S1, S2, no m/g/r, equal pulses throughout Respiratory: CTABL, no w/r/r, normal WOB GI: BS+, NDNT, no masses noted, no organomegaly noted MSK: No e/c/c Neuro: Alert to name, follows commands Psyc: Appropriate interaction and affect, calm/cooperative   Data Reviewed: I have personally reviewed following labs and  imaging studies.  CBC: Recent Labs  Lab 05/17/20 0500 05/17/20 0500 05/17/20 1630 05/18/20 0836 05/19/20 0418 05/20/20 0358 05/21/20 0500  WBC 10.4   < > 13.1* 11.5* 12.2* 12.9* 11.6*  NEUTROABS 6.2  --   --  8.5* 7.4 8.0* 7.2  HGB 6.1*   < > 8.6* 9.3* 9.1* 9.7* 9.0*  HCT 19.0*   < > 27.1* 29.1* 28.7* 30.5* 29.1*  MCV 88.4   < > 87.4 85.6 88.9 90.2 91.8  PLT 167   < > 178 191 211 232 226   < > = values in this interval not displayed.   Basic Metabolic Panel: Recent Labs  Lab 05/16/20 0550 05/16/20 1136 05/17/20 0500 05/18/20 0500 05/19/20 0418 05/20/20 0358 05/21/20 0500  NA   < >  --  135 134* 134* 135 136  K   < >  --  3.2* 3.5 3.7 3.8 3.8  CL   < >  --  99 101 104 104 105  CO2   < >  --  23 21* 20* 19* 19*  GLUCOSE   < >  --  83 94 91 99 88  BUN   < >  --  38* 50* 54* 64* 70*  CREATININE   < >  --  6.67* 7.02* 7.47* 7.60* 8.29*  CALCIUM   < >  --  7.2* 7.6* 7.8* 8.3* 8.0*  MG  --  1.7 1.6*  --  1.7 1.8 1.8  PHOS   < >  --  2.8 2.5 3.0 2.7 4.2   < > = values in this interval not displayed.   GFR: Estimated Creatinine Clearance: 10.7 mL/min (A) (by C-G formula based on SCr of 8.29 mg/dL (H)). Liver Function Tests: Recent Labs  Lab 05/17/20 0500 05/18/20 0500 05/19/20 0418 05/20/20 0358 05/21/20 0500  ALBUMIN 2.7* 3.0* 2.8* 3.1* 2.8*   No results for input(s): LIPASE, AMYLASE in the last 168 hours. No results for input(s): AMMONIA in the last 168 hours. Coagulation Profile: No results for input(s): INR, PROTIME in the last 168 hours. Cardiac Enzymes: No results for input(s): CKTOTAL, CKMB, CKMBINDEX, TROPONINI in the last 168 hours. BNP (last 3 results) No results for input(s): PROBNP in the last 8760 hours. HbA1C: No results for input(s): HGBA1C in the last 72 hours. CBG: No results for input(s): GLUCAP in the last 168 hours. Lipid Profile: No results for input(s): CHOL, HDL, LDLCALC, TRIG, CHOLHDL, LDLDIRECT in the last 72 hours. Thyroid Function  Tests: No results for input(s): TSH, T4TOTAL, FREET4, T3FREE, THYROIDAB in the last 72 hours. Anemia Panel: No results for input(s): VITAMINB12, FOLATE, FERRITIN, TIBC, IRON, RETICCTPCT in the last 72 hours. Sepsis Labs: No results for input(s): PROCALCITON, LATICACIDVEN in the last 168 hours.  Recent Results (from the past 240 hour(s))  SARS Coronavirus 2 by RT PCR (hospital order, performed in [facility] hospital lab) Nasopharyngeal Nasopharyngeal Swab     Status: None   Collection Time: 05/11/20  5:41 PM   Specimen: Nasopharyngeal Swab  Result  Value Ref Range Status   SARS Coronavirus 2 NEGATIVE NEGATIVE Final    Comment: (NOTE) SARS-CoV-2 target nucleic acids are NOT DETECTED.  The SARS-CoV-2 RNA is generally detectable in upper and lower respiratory specimens during the acute phase of infection. The lowest concentration of SARS-CoV-2 viral copies this assay can detect is 250 copies / mL. A negative result does not preclude SARS-CoV-2 infection and should not be used as the sole basis for treatment or other patient management decisions.  A negative result may occur with improper specimen collection / handling, submission of specimen other than nasopharyngeal swab, presence of viral mutation(s) within the areas targeted by this assay, and inadequate number of viral copies (<250 copies / mL). A negative result must be combined with clinical observations, patient history, and epidemiological information.  Fact Sheet for Patients:   StrictlyIdeas.no  Fact Sheet for Healthcare Providers: BankingDealers.co.za  This test is not yet approved or  cleared by the [place] FDA and has been authorized for detection and/or diagnosis of SARS-CoV-2 by FDA under an Emergency Use Authorization (EUA).  This EUA will remain in effect (meaning this test can be used) for the duration of the COVID-19 declaration under Section 564(b)(1) of the  Act, 21 U.S.C. section 360bbb-3(b)(1), unless the authorization is terminated or revoked sooner.  Performed at [facility], [place] [address] [place], [place] [address]   Culture, Urine     Status: None   Collection Time: 05/11/20  6:05 PM   Specimen: Urine, Clean Catch  Result Value Ref Range Status   Specimen Description   Final    URINE, CLEAN CATCH Performed at [facility], [place] [address] [place], [place] [address]    Special Requests   Final    NONE Performed at [facility], [place] [address], [place], [place] [address]    Culture   Final    NO GROWTH Performed at [facility], [place] [address], [place], [place] [address]    Report Status 05/13/2020 FINAL  Final      Radiology Studies: US RENAL  Result Date: 05/20/2020 CLINICAL DATA:  Inpatient.  Follow-up bilateral hydronephrosis. EXAM: RENAL / URINARY TRACT ULTRASOUND COMPLETE COMPARISON:  05/12/2019 CT abdomen/pelvis. FINDINGS: Right Kidney: Renal measurements: 10.3 x 6.6 x 6.2 cm = volume: 221 mL. Mildly echogenic right renal parenchyma. Mild hydronephrosis, improved from recent CT. No renal mass. Left Kidney: Renal measurements: 10.6 x 5.3 x 4.7 cm = volume: 137 mL. Mildly echogenic left renal parenchyma. Two simple upper left renal cysts, largest 3.4 x 3.3 x 2.7 cm in the posterior upper left kidney. Mild hydronephrosis, improved from recent CT. Bladder: Diffusely thick walled bladder collapsed by indwelling Foley catheter and not well evaluated. Other: None. IMPRESSION: 1. Mild bilateral hydronephrosis, improved bilaterally from recent CT. 2. 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 3. Bladder collapsed by indwelling Foley catheter and not well evaluated. Bladder appears diffusely thick walled, nonspecific, probably due to chronic bladder outlet obstruction. Electronically Signed   By: Ilona Sorrel M.D.   On:  05/20/2020 15:44     Scheduled Meds: . calcium carbonate  800 mg of elemental calcium Oral TID  . Chlorhexidine Gluconate Cloth  6 each Topical Q0600  . Chlorhexidine Gluconate Cloth  6 each Topical Q0600  . darbepoetin (ARANESP) injection - NON-DIALYSIS  150 mcg Subcutaneous Q Wed-1800  . divalproex  125 mg Oral BID  . docusate sodium  100  mg Oral BID  . feeding supplement (NEPRO CARB STEADY)  237 mL Oral BID BM  . latanoprost  1 drop Both Eyes QHS  . magnesium oxide  400 mg Oral Daily  . melatonin  5 mg Oral QHS  . multivitamin  1 tablet Oral QHS  . psyllium  1 packet Oral Daily  . QUEtiapine  100 mg Oral QHS   Followed by  . [START ON 05/23/2020] QUEtiapine  200 mg Oral QHS  . tamsulosin  0.8 mg Oral QPC supper   Continuous Infusions: . sodium chloride 100 mL/hr at 05/16/20 1428  . sodium chloride irrigation       LOS: 10 days    Time spent: 25 minutes spent in the coordination of care today.    Jonnie Finner, DO [facility]  If 7PM-7AM, please contact night-coverage [email or web address] 05/21/2020, 7:24 AM

## 2020-05-21 NOTE — Progress Notes (Signed)
Patient refused foley care for today and states he "might let night shift staff do foley care". Patient educated on importance and purpose of proper peri care and foley care and continues to refuse, states it is too "sensative". Will continue to encourage and educate patient on foley care.

## 2020-05-21 NOTE — Progress Notes (Signed)
PT Cancellation Note  Patient Details Name: Manuel Ortega MRN: [id number] DOB: Feb 18, 1951   Cancelled Treatment:    Reason Eval/Treat Not Completed: Patient at procedure or test/unavailable (Pt in HD. Will return as able. )   [name] 05/21/2020, 8:19 AM [name],PT Acute Rehabilitation Services Pager:  [phone]  Office:  [phone]

## 2020-05-21 NOTE — Progress Notes (Signed)
Patient returned to unit, no complaints or distress at this time.

## 2020-05-22 DIAGNOSIS — N179 Acute kidney failure, unspecified: Secondary | ICD-10-CM | POA: Diagnosis not present

## 2020-05-22 DIAGNOSIS — E876 Hypokalemia: Secondary | ICD-10-CM | POA: Diagnosis not present

## 2020-05-22 LAB — CBC WITH DIFFERENTIAL/PLATELET
Abs Immature Granulocytes: 0.14 10*3/uL — ABNORMAL HIGH (ref 0.00–0.07)
Basophils Absolute: 0 10*3/uL (ref 0.0–0.1)
Basophils Relative: 0 %
Eosinophils Absolute: 0.1 10*3/uL (ref 0.0–0.5)
Eosinophils Relative: 1 %
HCT: 28.5 % — ABNORMAL LOW (ref 39.0–52.0)
Hemoglobin: 8.8 g/dL — ABNORMAL LOW (ref 13.0–17.0)
Immature Granulocytes: 1 %
Lymphocytes Relative: 17 %
Lymphs Abs: 1.9 10*3/uL (ref 0.7–4.0)
MCH: 28.1 pg (ref 26.0–34.0)
MCHC: 30.9 g/dL (ref 30.0–36.0)
MCV: 91.1 fL (ref 80.0–100.0)
Monocytes Absolute: 0.8 10*3/uL (ref 0.1–1.0)
Monocytes Relative: 8 %
Neutro Abs: 7.8 10*3/uL — ABNORMAL HIGH (ref 1.7–7.7)
Neutrophils Relative %: 73 %
Platelets: 191 10*3/uL (ref 150–400)
RBC: 3.13 MIL/uL — ABNORMAL LOW (ref 4.22–5.81)
RDW: 16 % — ABNORMAL HIGH (ref 11.5–15.5)
WBC: 10.7 10*3/uL — ABNORMAL HIGH (ref 4.0–10.5)
nRBC: 0 % (ref 0.0–0.2)

## 2020-05-22 LAB — RENAL FUNCTION PANEL
Albumin: 2.7 g/dL — ABNORMAL LOW (ref 3.5–5.0)
Anion gap: 11 (ref 5–15)
BUN: 49 mg/dL — ABNORMAL HIGH (ref 8–23)
CO2: 23 mmol/L (ref 22–32)
Calcium: 7.9 mg/dL — ABNORMAL LOW (ref 8.9–10.3)
Chloride: 100 mmol/L (ref 98–111)
Creatinine, Ser: 5.99 mg/dL — ABNORMAL HIGH (ref 0.61–1.24)
GFR calc Af Amer: 10 mL/min — ABNORMAL LOW (ref >60)
GFR calc non Af Amer: 9 mL/min — ABNORMAL LOW (ref >60)
Glucose, Bld: 98 mg/dL (ref 70–99)
Phosphorus: 3.5 mg/dL (ref 2.5–4.6)
Potassium: 3.9 mmol/L (ref 3.5–5.1)
Sodium: 134 mmol/L — ABNORMAL LOW (ref 135–145)

## 2020-05-22 LAB — MAGNESIUM: Magnesium: 1.9 mg/dL (ref 1.7–2.4)

## 2020-05-22 NOTE — TOC Initial Note (Signed)
Transition of Care [facility]) - Initial/Assessment Note    Patient Details  Name: Manuel Ortega MRN: [id number] Date of Birth: 1950-12-11  Transition of Care [facility]) CM/SW Contact:    Carles Collet, RN Phone Number: 05/22/2020, 4:21 PM  Clinical Narrative:                 Spoke w patient at bedside. He states that he is in need of PCP, [place] services, Meals on Wheels, and possible transportation. Explained to patient that TOC cannot arrange transportation to every appointment that he has after DC. He states that he was driving prior to admission.  Discussed possibility of assistance through [employer] etc, especially if he is deemed unable to drive at DC and needed HD. Referral made to doctors making housecalls. Faxed information to [phone]. They will contact patient with date and time of appointment IF they are able to schedule him. Janeece Riggers could not provide an estimated time of when appointment would be until forms are reviewed. Alvis Lemmings accepted for [facility] services. He does not have much social support in town.   Expected Discharge Plan: [place] Barriers to Discharge: Continued Medical Work up   Patient Goals and CMS Choice Patient states their goals for this hospitalization and ongoing recovery are:: to go home CMS Medicare.gov Compare Post Acute Care list provided to:: Patient Choice offered to / list presented to : Patient  Expected Discharge Plan and Services Expected Discharge Plan: [place] Arranged: PT, OT, Nurse's Aide [facility] Agency: [place] Date [facility] Agency Contacted: 05/22/20 Time HH Agency Contacted: 1203 Representative spoke with at [place]: Tommi Rumps  Prior Living Arrangements/Services                       Activities of Daily Living Home Assistive Devices/Equipment: Eyeglasses ADL Screening (condition at time of admission) Patient's cognitive ability adequate to safely complete  daily activities?: Yes Is the patient deaf or have difficulty hearing?: No Does the patient have difficulty seeing, even when wearing glasses/contacts?: No Does the patient have difficulty concentrating, remembering, or making decisions?: No Patient able to express need for assistance with ADLs?: Yes Does the patient have difficulty dressing or bathing?: No Independently performs ADLs?: Yes (appropriate for developmental age) Does the patient have difficulty walking or climbing stairs?: Yes Weakness of Legs: Both Weakness of Arms/Hands: None  Permission Sought/Granted                  Emotional Assessment              Admission diagnosis:  Hypocalcemia [E83.51] Hypokalemia [E87.6] Hypomagnesemia [E83.42] Prolonged Q-T interval on ECG [R94.31] AKI (acute kidney injury) ([place]) [N17.9] Acute kidney injury ([place]) [N17.9] Acute renal failure, unspecified acute renal failure type ([place]) [N17.9] Non-intractable vomiting with nausea, unspecified vomiting type [R11.2] Patient Active Problem List   Diagnosis Date Noted  . Acute kidney injury ([place]) 05/11/2020  . Hypokalemia 05/11/2020  . Hypocalcemia 05/11/2020  . Hypomagnesemia 05/11/2020  . Nausea and vomiting 05/11/2020   PCP:  System, Pcp Not In Pharmacy:   [place], [place] [id number] [place] [address] Phone: [phone] Fax: [phone]     Social Determinants  of Health (SDOH) Interventions    Readmission Risk Interventions No flowsheet data found.  

## 2020-05-22 NOTE — Progress Notes (Signed)
PROGRESS NOTE    Manuel Ortega  WJX:[id number] DOB: [birth year] DOA: 05/11/2020 PCP: System, Pcp Not In   Brief Narrative:   Manuel Ortega an 69 y.o.malepast medical history significant of depression and hypertension noncompliant with his medication with no primary care doctor for over the last 5 years presented to the [facility] long hospital with nausea vomiting that started 4 days prior to admission. In the ED he was found to have a white count of 13 BUN of 127 creatinine of 18 potassium was 2.6 anion gap of 29 and a bicarb of 23. 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 Nephrology was consulted, as well as IR for tunneled HD catheter.  8/10: No acute events ON. Reports sleeping well. No HD today. He is doing well. Continue current Tx.    Assessment & Plan: Acute kidney injury due to obstructive uropathy Uremia N/V - Nausea vomiting likely due to uremic symptoms, now resolved. - IR was consulted and HD tunneled catheter placed on 05/12/2020. - Nephrology onboard. HD for uremia. - His uremic symptoms are clinically improved his nausea is improved as well as his appetite. - 8/10: HD yesterday. Tolerated. No need for HD today. Appreciate nephrology assistance.  Obstructive uropathy/BPH Hematuria Urinary retention -appreciate urology assistance - continues to have gross hematuria; continue foley, urology to follow up outpt - urology to follow up retention outpt. - s/p 4 units pRBC; urologyhas changed cath to hematuria cath - 8/10: UOP is good. Urine clear. Appreciate urology assistance.   Syncope - episode while having BM this AM; likely vasovagal - check orthostatics: tachy throughout test, but BP ok. Will get small amount of fluids. -orthostatics ok - no further episodes noted  Hypokalemia Hypomagnesemia Hypocalcemia Hyponatremia Secondary hyperphosphatemia -replaceper  nephro, monitor  Anemia of chronic renal disease vomiting: Fe-deficiency Acute blood loss anemia - Fe mildly low, s/p IV Fesupplement - blood loss from hematuria - s/p 4 units pRBC; -Hgb is stable, resolved  Leukocytosis: - mildly elevated; no fever, follow  Elevated lipase - Likely due to nausea and vomiting he has no pancreatitis. - Tolerating his diet well.  DVT prophylaxis: SCDs Code Status: FULL Family Communication: None at bedside.    Status is: Inpatient  Remains inpatient appropriate because:Inpatient level of care appropriate due to severity of illness   Dispo: The patient is from: Home              Anticipated d/c is to: Home              Anticipated d/c date is: > 3 days              Patient currently is not medically stable to d/c.  Consultants:   Nephrology  Urology  ROS:  Denies CP, N, V, ab pain. Remainder ROS is negative for all not previously mentioned.  Subjective: "That's what the kidney doc said."  Objective: Vitals:   05/21/20 2031 05/22/20 0057 05/22/20 0530 05/22/20 0537  BP: 134/73 120/86 125/79   Pulse: (!) 109 (!) 110 95   Resp: 18  17   Temp: 99.2 F (37.3 C)  98.5 F (36.9 C)   TempSrc: Oral  Oral   SpO2: 100% 98% 99%   Weight:    98.4 kg  Height:        Intake/Output Summary (Last 24 hours) at 05/22/2020 0731 Last data filed at 05/22/2020 0538 Gross per 24 hour  Intake 1000 ml  Output 1500 ml  Net -500 ml  [employer]   05/21/20 0730 05/21/20 1107 05/22/20 0537  Weight: 102.6 kg 102.6 kg 98.4 kg    Examination:  General: 69 y.o. male resting in bed in NAD Cardiovascular: RRR, +S1, S2, no m/g/r Respiratory: CTABL, no w/r/r, normal WOB GI: BS+, NDNT, soft MSK: No e/c/c Neuro: Alert to name, follows commands Psyc: Appropriate interaction and affect, calm/cooperative   Data Reviewed: I have personally reviewed following labs and imaging studies.  CBC: Recent Labs  Lab  06/16/2020 0836 05/19/20 0418 05/20/20 0358 05/21/20 0500 05/22/20 0542  WBC 11.5* 12.2* 12.9* 11.6* 10.7*  NEUTROABS 8.5* 7.4 8.0* 7.2 7.8*  HGB 9.3* 9.1* 9.7* 9.0* 8.8*  HCT 29.1* 28.7* 30.5* 29.1* 28.5*  MCV 85.6 88.9 90.2 91.8 91.1  PLT 191 211 232 226 956   Basic Metabolic Panel: Recent Labs  Lab 05/17/20 0500 05/17/20 0500 Jun 16, 2020 0500 05/19/20 0418 05/20/20 0358 05/21/20 0500 05/22/20 0542  NA 135   < > 134* 134* 135 136 134*  K 3.2*   < > 3.5 3.7 3.8 3.8 3.9  CL 99   < > 101 104 104 105 100  CO2 23   < > 21* 20* 19* 19* 23  GLUCOSE 83   < > 94 91 99 88 98  BUN 38*   < > 50* 54* 64* 70* 49*  CREATININE 6.67*   < > 7.02* 7.47* 7.60* 8.29* 5.99*  CALCIUM 7.2*   < > 7.6* 7.8* 8.3* 8.0* 7.9*  MG 1.6*  --   --  1.7 1.8 1.8 1.9  PHOS 2.8   < > 2.5 3.0 2.7 4.2 3.5   < > = values in this interval not displayed.   GFR: Estimated Creatinine Clearance: 14.6 mL/min (A) (by C-G formula based on SCr of 5.99 mg/dL (H)). Liver Function Tests: Recent Labs  Lab 2020/06/16 0500 05/19/20 0418 05/20/20 0358 05/21/20 0500 05/22/20 0542  ALBUMIN 3.0* 2.8* 3.1* 2.8* 2.7*   No results for input(s): LIPASE, AMYLASE in the last 168 hours. No results for input(s): AMMONIA in the last 168 hours. Coagulation Profile: No results for input(s): INR, PROTIME in the last 168 hours. Cardiac Enzymes: No results for input(s): CKTOTAL, CKMB, CKMBINDEX, TROPONINI in the last 168 hours. BNP (last 3 results) No results for input(s): PROBNP in the last 8760 hours. HbA1C: No results for input(s): HGBA1C in the last 72 hours. CBG: No results for input(s): GLUCAP in the last 168 hours. Lipid Profile: No results for input(s): CHOL, HDL, LDLCALC, TRIG, CHOLHDL, LDLDIRECT in the last 72 hours. Thyroid Function Tests: No results for input(s): TSH, T4TOTAL, FREET4, T3FREE, THYROIDAB in the last 72 hours. Anemia Panel: No results for input(s): VITAMINB12, FOLATE, FERRITIN, TIBC, IRON, RETICCTPCT in  the last 72 hours. Sepsis Labs: No results for input(s): PROCALCITON, LATICACIDVEN in the last 168 hours.  No results found for this or any previous visit (from the past 240 hour(s)).    Radiology Studies: US RENAL  Result Date: 05/20/2020 CLINICAL DATA:  Inpatient.  Follow-up bilateral hydronephrosis. EXAM: RENAL / URINARY TRACT ULTRASOUND COMPLETE COMPARISON:  05/12/2019 CT abdomen/pelvis. FINDINGS: Right Kidney: Renal measurements: 10.3 x 6.6 x 6.2 cm = volume: 221 mL. Mildly echogenic right renal parenchyma. Mild hydronephrosis, improved from recent CT. No renal mass. Left Kidney: Renal measurements: 10.6 x 5.3 x 4.7 cm = volume: 137 mL. Mildly echogenic left renal parenchyma. Two simple upper left renal cysts, largest 3.4 x 3.3 x 2.7 cm in the posterior upper left kidney. Mild hydronephrosis,  improved from recent CT. Bladder: Diffusely thick walled bladder collapsed by indwelling Foley catheter and not well evaluated. Other: None. IMPRESSION: 1. Mild bilateral hydronephrosis, improved bilaterally from recent CT. 2. 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 3. Bladder collapsed by indwelling Foley catheter and not well evaluated. Bladder appears diffusely thick walled, nonspecific, probably due to chronic bladder outlet obstruction. Electronically Signed   By: Ilona Sorrel M.D.   On: 05/20/2020 15:44     Scheduled Meds: . calcium carbonate  800 mg of elemental calcium Oral TID  . Chlorhexidine Gluconate Cloth  6 each Topical Q0600  . Chlorhexidine Gluconate Cloth  6 each Topical Q0600  . darbepoetin (ARANESP) injection - NON-DIALYSIS  150 mcg Subcutaneous Q Wed-1800  . divalproex  125 mg Oral BID  . docusate sodium  100 mg Oral BID  . feeding supplement (NEPRO CARB STEADY)  237 mL Oral BID BM  . latanoprost  1 drop Both Eyes QHS  . magnesium oxide  400 mg Oral Daily  . melatonin  5 mg Oral QHS  . multivitamin  1 tablet Oral QHS  .  psyllium  1 packet Oral Daily  . QUEtiapine  100 mg Oral QHS   Followed by  . [START ON 05/23/2020] QUEtiapine  200 mg Oral QHS  . tamsulosin  0.8 mg Oral QPC supper   Continuous Infusions: . sodium chloride 100 mL/hr at 05/16/20 1428  . sodium chloride irrigation       LOS: 11 days    Time spent: 25 minutes spent in the coordination of care today.    Jonnie Finner, DO [facility]  If 7PM-7AM, please contact night-coverage [email or web address] 05/22/2020, 7:31 AM

## 2020-05-22 NOTE — Progress Notes (Signed)
Physical Therapy Treatment Patient Details Name: Manuel Ortega MRN: [id number] DOB: [birth year] Today's Date: 05/22/2020    History of Present Illness 69 y.o. male with medical history significant of major depression and hypertension,  not taking any medication,  has no primary care physician, he has no medical care in the last 5 years presenting in the [facility] emergency department with nausea and vomiting for past 4 days.  He also report having decreased appetite,  intermittent cough but denies any weight loss.  He denies any history of kidney disease.    PT Comments    Pt admitted with above diagnosis. Pt was able to ambulate with RW with min guard assist as he was unbalanced without RW.  Incr distance today and no episodes of dizziness. Left pt up in chair.  Pt currently with functional limitations due to the deficits listed below (see PT Problem List). Pt will benefit from skilled PT to increase their independence and safety with mobility to allow discharge to the venue listed below.   BP supine 14094, 116 bpm BP sitting 156/84, 122 bpm BP after walking 30 feet 158/82, 124 bpm BP after walking 90 feet 150/91, 128 bpm O2 sats 100% on RA.   Follow Up Recommendations  Home health PT     Equipment Recommendations  3in1 (PT)    Recommendations for Other Services Other (comment) (social work consult (mentions his depression multiple times))     Precautions / Restrictions Precautions Precautions: Fall Precaution Comments: highly anxious, bladder irrigation Restrictions Weight Bearing Restrictions: No    Mobility  Bed Mobility Overal bed mobility: Needs Assistance Bed Mobility: Supine to Sit;Sit to Supine     Supine to sit: Min guard;HOB elevated     General bed mobility comments: Increased time, light assist with pt holding foley tubing due to fear of pain to get to EOB.  Transfers Overall transfer level: Needs assistance Equipment used: None;Rolling walker (2  wheeled) Transfers: Sit to/from Stand Sit to Stand: Min assist         General transfer comment: Min A to steady in standing, slow to rise. No dizziness.   Ambulation/Gait Ambulation/Gait assistance: Min guard;Min assist;+2 safety/equipment Gait Distance (Feet): 120 Feet (30 feet then 90 feet) Assistive device: None;Rolling walker (2 wheeled) Gait Pattern/deviations: Step-to pattern;Step-through pattern;Decreased stride length;Drifts right/left;Shuffle Gait velocity: reduced Gait velocity interpretation: <1.31 ft/sec, indicative of household ambulator General Gait Details: Very slow, guarded gait within room only (declines leaving room); Pt initially did not use device but was unsteady and unsure on his feet.  Obtained RW and pt was able to ambulate with min guard assist making laps in room.  No dizziness.     Stairs             Wheelchair Mobility    Modified Rankin (Stroke Patients Only)       Balance Overall balance assessment: Needs assistance Sitting-balance support: No upper extremity supported;Feet supported Sitting balance-Leahy Scale: Fair     Standing balance support: During functional activity;Bilateral upper extremity supported;No upper extremity supported Standing balance-Leahy Scale: Fair Standing balance comment: can stand statically without UE support but needs support of UEs with gait.                             Cognition Arousal/Alertness: Awake/alert Behavior During Therapy: Anxious Overall Cognitive Status: No family/caregiver present to determine baseline cognitive functioning Area of Impairment: Following commands;Safety/judgement;Problem solving;Awareness  Orientation Level: Place     Following Commands: Follows one step commands consistently Safety/Judgement: Decreased awareness of deficits Awareness: Emergent Problem Solving: Requires verbal cues General Comments: Highly anxious about mobility       Exercises      General Comments        Pertinent Vitals/Pain Faces Pain Scale: Hurts little more Pain Location: foley catheter Pain Descriptors / Indicators: Sore Pain Intervention(s): Limited activity within patient's tolerance;Monitored during session;Repositioned    Home Living                      Prior Function            PT Goals (current goals can now be found in the care plan section) Acute Rehab PT Goals Patient Stated Goal: To improve mobility and find more assistance Progress towards PT goals: Progressing toward goals    Frequency    Min 3X/week      PT Plan Current plan remains appropriate    Co-evaluation              AM-PAC PT "6 Clicks" Mobility   Outcome Measure  Help needed turning from your back to your side while in a flat bed without using bedrails?: None Help needed moving from lying on your back to sitting on the side of a flat bed without using bedrails?: A Little Help needed moving to and from a bed to a chair (including a wheelchair)?: A Little Help needed standing up from a chair using your arms (e.g., wheelchair or bedside chair)?: A Little Help needed to walk in hospital room?: A Little Help needed climbing 3-5 steps with a railing? : A Little 6 Click Score: 19    End of Session Equipment Utilized During Treatment: Gait belt Activity Tolerance: Other (comment);Treatment limited secondary to medical complications (Comment);Patient limited by fatigue (self limiting due to anxiety; dizziness) Patient left: with call bell/phone within reach;in chair;with chair alarm set Nurse Communication: Mobility status PT Visit Diagnosis: Unsteadiness on feet (R26.81);Other abnormalities of gait and mobility (R26.89);Muscle weakness (generalized) (M62.81);Other symptoms and signs involving the nervous system (R29.898)     Time: 2863-8177 PT Time Calculation (min) (ACUTE ONLY): 28 min  Charges:  $Gait Training: 8-22  mins $Therapeutic Activity: 8-22 mins                     Davine Coba W,PT Acute Rehabilitation Services Pager:  [phone]  Office:  [place] 05/22/2020, 3:41 PM

## 2020-05-22 NOTE — Progress Notes (Signed)
Patient refuse CHG bath and foley care educate patient the importance of having CHG and foley care, patient stated he will do the CHG and foley care himself encourage him to let the staff do it to make sure is done properly ,patient want to wait till after kidney  Doctor round on him. Will continue to monitor .

## 2020-05-22 NOTE — Progress Notes (Signed)
[facility] Progress Note     Assessment/ Plan:   1. AKI from obstructive uropathy,uremia present without significant initial improvement with placement of foley. Did HD on 8/1 for uremia with clinical improvement. second HD treatment on 8/3  Last  HD #3 8/9  RIJ temp cath   Too soon to declare what his long term dialysis needs will be- but if needing this week then  would plan on changing vascath to [facility] and try to arrange OP HD for AKI. If obstructive uropathy has been present for a long time then this may be permanent req RRT indefinitely until transplant.  Holding HD unless there is an absolute indication and will monitor daily labs. I am hopeful he will start recovering in the next few days.   2. Obstructive uropathy presumed from BPH, severe, now with Foley catheter draining clear now - urology involved - CBI 3. Elevated PSA from #2, probably, maintain foley- uro f/u-  4. Hypokalemia, likely due to gastrointestinal losses; Supplement PRN- High K bath  5. Hypocalcemia from severity of renal failure, improving 6. Hypomagnesemia from gastrointestinal losses, on supplement 7. Hyperphosphatemia secondary to #1- better 8. Metabolic acidosis and alkalosis, from renal failure andgatriclosses+ PO NaHCO3 intake; AG down- resolved 9. Depression 10. Normocytic anemia, TSAT 26%- gave IV iron- feraheme times 2 doses - discovered to have a hgb under 6- Got 2 units PRBC on 8/3- - dosed with ESA as well-- more blood on 8/5.Presumed GU source- hgb better today    Subjective:   Feeling better now that he got some sleep. Denies f/c/n/v/dyspnea Has great appetite   Objective:   BP 136/67 (BP Location: Left Arm)   Pulse (!) 114   Temp 98.2 F (36.8 C) (Oral)   Resp 16   Ht 6\' 1"  (1.854 m)   Wt 98.4 kg   SpO2 99%   BMI 28.62 kg/m   Intake/Output Summary (Last 24 hours) at 05/22/2020 1208 Last data filed at 05/22/2020 0900 Gross per 24 hour  Intake  1120 ml  Output 1500 ml  Net -380 ml   Weight change: 1.1 kg  Physical Exam: General: nad- Right sided vascath in place- Placed 8/1 Heart: RRR, no rub Lungs: clear Abdomen: soft, NABS Extremities: no edema GU: foley draining clear now Neuro: better   Imaging: US RENAL  Result Date: 05/20/2020 CLINICAL DATA:  Inpatient.  Follow-up bilateral hydronephrosis. EXAM: RENAL / URINARY TRACT ULTRASOUND COMPLETE COMPARISON:  05/12/2019 CT abdomen/pelvis. FINDINGS: Right Kidney: Renal measurements: 10.3 x 6.6 x 6.2 cm = volume: 221 mL. Mildly echogenic right renal parenchyma. Mild hydronephrosis, improved from recent CT. No renal mass. Left Kidney: Renal measurements: 10.6 x 5.3 x 4.7 cm = volume: 137 mL. Mildly echogenic left renal parenchyma. Two simple upper left renal cysts, largest 3.4 x 3.3 x 2.7 cm in the posterior upper left kidney. Mild hydronephrosis, improved from recent CT. Bladder: Diffusely thick walled bladder collapsed by indwelling Foley catheter and not well evaluated. Other: None. IMPRESSION: 1. Mild bilateral hydronephrosis, improved bilaterally from recent CT. 2. 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 3. Bladder collapsed by indwelling Foley catheter and not well evaluated. Bladder appears diffusely thick walled, nonspecific, probably due to chronic bladder outlet obstruction. Electronically Signed   By: Ilona Sorrel M.D.   On: 05/20/2020 15:44    Labs: BMET Recent Labs  Lab 05/16/20 0550 05/17/20 0500 05/18/20 0500 05/19/20 0418 05/20/20 0358 05/21/20 0500 05/22/20 0542  NA 136 135  134* 134* 135 136 134*  K 3.4* 3.2* 3.5 3.7 3.8 3.8 3.9  CL 100 99 101 104 104 105 100  CO2 26 23 21* 20* 19* 19* 23  GLUCOSE 96 83 94 91 99 88 98  BUN 34* 38* 50* 54* 64* 70* 49*  CREATININE 6.28* 6.67* 7.02* 7.47* 7.60* 8.29* 5.99*  CALCIUM 7.1* 7.2* 7.6* 7.8* 8.3* 8.0* 7.9*  PHOS 3.0 2.8 2.5 3.0 2.7 4.2 3.5   CBC Recent Labs   Lab 05/19/20 0418 05/20/20 0358 05/21/20 0500 05/22/20 0542  WBC 12.2* 12.9* 11.6* 10.7*  NEUTROABS 7.4 8.0* 7.2 7.8*  HGB 9.1* 9.7* 9.0* 8.8*  HCT 28.7* 30.5* 29.1* 28.5*  MCV 88.9 90.2 91.8 91.1  PLT 211 232 226 191    Medications:    . calcium carbonate  800 mg of elemental calcium Oral TID  . Chlorhexidine Gluconate Cloth  6 each Topical Q0600  . Chlorhexidine Gluconate Cloth  6 each Topical Q0600  . darbepoetin (ARANESP) injection - NON-DIALYSIS  150 mcg Subcutaneous Q Wed-1800  . divalproex  125 mg Oral BID  . docusate sodium  100 mg Oral BID  . feeding supplement (NEPRO CARB STEADY)  237 mL Oral BID BM  . latanoprost  1 drop Both Eyes QHS  . magnesium oxide  400 mg Oral Daily  . melatonin  5 mg Oral QHS  . multivitamin  1 tablet Oral QHS  . psyllium  1 packet Oral Daily  . QUEtiapine  100 mg Oral QHS   Followed by  . [START ON 05/23/2020] QUEtiapine  200 mg Oral QHS  . tamsulosin  0.8 mg Oral QPC supper      Otelia Santee, MD 05/22/2020, 12:08 PM

## 2020-05-23 DIAGNOSIS — N179 Acute kidney failure, unspecified: Secondary | ICD-10-CM | POA: Diagnosis not present

## 2020-05-23 LAB — RENAL FUNCTION PANEL
Albumin: 2.9 g/dL — ABNORMAL LOW (ref 3.5–5.0)
Anion gap: 15 (ref 5–15)
BUN: 62 mg/dL — ABNORMAL HIGH (ref 8–23)
CO2: 18 mmol/L — ABNORMAL LOW (ref 22–32)
Calcium: 8.2 mg/dL — ABNORMAL LOW (ref 8.9–10.3)
Chloride: 101 mmol/L (ref 98–111)
Creatinine, Ser: 6.85 mg/dL — ABNORMAL HIGH (ref 0.61–1.24)
GFR calc Af Amer: 9 mL/min — ABNORMAL LOW (ref >60)
GFR calc non Af Amer: 8 mL/min — ABNORMAL LOW (ref >60)
Glucose, Bld: 89 mg/dL (ref 70–99)
Phosphorus: 4.6 mg/dL (ref 2.5–4.6)
Potassium: 4.2 mmol/L (ref 3.5–5.1)
Sodium: 134 mmol/L — ABNORMAL LOW (ref 135–145)

## 2020-05-23 LAB — CBC WITH DIFFERENTIAL/PLATELET
Abs Immature Granulocytes: 0.17 10*3/uL — ABNORMAL HIGH (ref 0.00–0.07)
Basophils Absolute: 0.1 10*3/uL (ref 0.0–0.1)
Basophils Relative: 1 %
Eosinophils Absolute: 0.1 10*3/uL (ref 0.0–0.5)
Eosinophils Relative: 1 %
HCT: 29.8 % — ABNORMAL LOW (ref 39.0–52.0)
Hemoglobin: 9.4 g/dL — ABNORMAL LOW (ref 13.0–17.0)
Immature Granulocytes: 1 %
Lymphocytes Relative: 19 %
Lymphs Abs: 2.4 10*3/uL (ref 0.7–4.0)
MCH: 28.9 pg (ref 26.0–34.0)
MCHC: 31.5 g/dL (ref 30.0–36.0)
MCV: 91.7 fL (ref 80.0–100.0)
Monocytes Absolute: 0.9 10*3/uL (ref 0.1–1.0)
Monocytes Relative: 7 %
Neutro Abs: 8.6 10*3/uL — ABNORMAL HIGH (ref 1.7–7.7)
Neutrophils Relative %: 71 %
Platelets: 204 10*3/uL (ref 150–400)
RBC: 3.25 MIL/uL — ABNORMAL LOW (ref 4.22–5.81)
RDW: 16.1 % — ABNORMAL HIGH (ref 11.5–15.5)
WBC: 12.3 10*3/uL — ABNORMAL HIGH (ref 4.0–10.5)
nRBC: 0 % (ref 0.0–0.2)

## 2020-05-23 LAB — MAGNESIUM: Magnesium: 2 mg/dL (ref 1.7–2.4)

## 2020-05-23 NOTE — Progress Notes (Signed)
Patient ID: Manuel Ortega, male   DOB: [birth year], 69 y.o.   MRN: [id number]    Subjective: Pt without new complaints.  No new hematuria.   Objective: Vital signs in last 24 hours: Temp:  [98.1 F (36.7 C)-98.9 F (37.2 C)] 98.1 F (36.7 C) (08/11 0623) Pulse Rate:  [94-114] 94 (08/11 0623) Resp:  [16] 16 (08/11 0623) BP: (122-136)/(67-82) 134/81 (08/11 0623) SpO2:  [98 %-100 %] 99 % (08/11 0623) Weight:  [99.9 kg] 99.9 kg (08/11 0623)  Intake/Output from previous day: 08/10 0701 - 08/11 0700 In: 1080 [P.O.:1080] Out: 400 [Urine:400] Intake/Output this shift: No intake/output data recorded.  Physical Exam:  General: Alert and oriented GU: Urethral catheter in place.  Urine is clear.  Lab Results: Recent Labs    05/21/20 0500 05/22/20 0542  HGB 9.0* 8.8*  HCT 29.1* 28.5*   BMET Recent Labs    05/21/20 0500 05/22/20 0542  NA 136 134*  K 3.8 3.9  CL 105 100  CO2 19* 23  GLUCOSE 88 98  BUN 70* 49*  CREATININE 8.29* 5.99*  CALCIUM 8.0* 7.9*     Studies/Results: No results found.  Assessment/Plan: 1) Urinary retention due to probably bladder outlet obstruction with obstructive uropathy:  Continue indwelling urethral catheter for now and discharge patient with catheter when appropriate.  Will arrange outpatient follow up in about 2-3 weeks for re-evaluation and to change catheter vs start CIC if urine still remains clear.  Pt remains dialysis dependent for now.  Will eventually proceed with outpatient urodynamics evaluation to assess detrusor function for long term bladder management strategy.  Discussed this with patient this morning.  2) Hydronephrosis: Improved on recent ultrasound indicating this was related to bladder outlet obstruction/retention.  Continue catheter as above.  3) Hematuria: Resolved.  Likely related to catheter trauma or distended bladder and decompression.  Will proceed with outpatient cystoscopy to confirm no other concerning cause.  4)  Elevated PSA: Likely false positive.  Will repeat in future as outpatient.   LOS: 12 days   Dutch Gray 05/23/2020, 7:40 AM

## 2020-05-23 NOTE — Progress Notes (Signed)
[facility] Progress Note     Assessment/ Plan:   1. AKI from obstructive uropathy,uremia present without significant initial improvement with placement of foley. Did HD on 8/1 for uremia with clinical improvement. second HD treatment on 8/3  Last  HD #3 8/9  RIJ temp cath   Too soon to declare what his long term dialysis needs will be- but if needs further dialysis later this weekthenwould plan onchangingvascath to [facility] and try to arrange OP HD for AKI.   If obstructive uropathy has been present for a long time then this may be permanent req RRT indefinitely until transplant.  Holding HD unless there is an absolute indication and will monitor daily labs; if no signs of recovery in next 48hrs then will plan on conversion to TC on Fr w/ VIR. I am hopeful he will start recovering in the next few days.   2. Obstructive uropathy presumed from BPH, severe, now with Foley catheter drainingclear now - urologyinvolved - CBI 3. Elevated PSA from #2, probably, maintain foley- uro f/u-  4. Hypokalemia, likely due to gastrointestinal losses; Supplement PRN- High K bath  5. Hypocalcemia from severity of renal failure, improving 6. Hypomagnesemia from gastrointestinal losses, on supplement 7. Hyperphosphatemia secondary to #1- better 8. Metabolic acidosis and alkalosis, from renal failure andgatriclosses+ PO NaHCO3 intake; AG down- resolved 9. Depression 10. Normocytic anemia, TSAT 26%- gave IV iron- feraheme times 2 doses - discovered to have a hgb under 6- Got 2 units PRBC on 8/3- - dosed with ESA as well-- more blood on 8/5.Presumed GU source- hgb better today  Subjective:   Feeling better; no events overnight Denies f/c/n/v/dyspnea; states his UOP is still great Has good appetite   Objective:   BP 134/81 (BP Location: Left Arm)   Pulse 94   Temp 98.1 F (36.7 C) (Oral)   Resp 16   Ht 6\' 1"  (1.854 m)   Wt 99.9 kg   SpO2 99%   BMI 29.06  kg/m   Intake/Output Summary (Last 24 hours) at 05/23/2020 0747 Last data filed at 05/23/2020 9323 Gross per 24 hour  Intake 1080 ml  Output 400 ml  Net 680 ml   Weight change: -2.7 kg  Physical Exam: General: nad- Right sided vascath in place- Placed 8/1 Heart: RRR, no rub Lungs: clear Abdomen: soft, NABS Extremities: no edema GU: foley drainingclear now Neuro: better  Imaging: No results found.  Labs: BMET Recent Labs  Lab 05/17/20 0500 05/18/20 0500 05/19/20 0418 05/20/20 0358 05/21/20 0500 05/22/20 0542  NA 135 134* 134* 135 136 134*  K 3.2* 3.5 3.7 3.8 3.8 3.9  CL 99 101 104 104 105 100  CO2 23 21* 20* 19* 19* 23  GLUCOSE 83 94 91 99 88 98  BUN 38* 50* 54* 64* 70* 49*  CREATININE 6.67* 7.02* 7.47* 7.60* 8.29* 5.99*  CALCIUM 7.2* 7.6* 7.8* 8.3* 8.0* 7.9*  PHOS 2.8 2.5 3.0 2.7 4.2 3.5   CBC Recent Labs  Lab 05/19/20 0418 05/20/20 0358 05/21/20 0500 05/22/20 0542  WBC 12.2* 12.9* 11.6* 10.7*  NEUTROABS 7.4 8.0* 7.2 7.8*  HGB 9.1* 9.7* 9.0* 8.8*  HCT 28.7* 30.5* 29.1* 28.5*  MCV 88.9 90.2 91.8 91.1  PLT 211 232 226 191    Medications:    . calcium carbonate  800 mg of elemental calcium Oral TID  . Chlorhexidine Gluconate Cloth  6 each Topical Q0600  . Chlorhexidine Gluconate Cloth  6 each Topical Q0600  . darbepoetin (ARANESP)  injection - NON-DIALYSIS  150 mcg Subcutaneous Q Wed-1800  . divalproex  125 mg Oral BID  . docusate sodium  100 mg Oral BID  . feeding supplement (NEPRO CARB STEADY)  237 mL Oral BID BM  . latanoprost  1 drop Both Eyes QHS  . magnesium oxide  400 mg Oral Daily  . melatonin  5 mg Oral QHS  . multivitamin  1 tablet Oral QHS  . psyllium  1 packet Oral Daily  . QUEtiapine  200 mg Oral QHS  . tamsulosin  0.8 mg Oral QPC supper      Otelia Santee, MD 05/23/2020, 7:47 AM

## 2020-05-23 NOTE — Progress Notes (Addendum)
PROGRESS NOTE    Manuel Ortega  BOF:[id number] DOB: 04-Nov-1950 DOA: 05/11/2020 PCP: System, Pcp Not In    Brief Narrative:  69 y.o.malepast medical history significant of depression and hypertension noncompliant with his medication with no primary care doctor for over the last 5 years presented to the [facility] with nausea vomiting that started 4 days prior to admission. In the ED he was found to have a white count of 13 BUN of 127 creatinine of 18 potassium was 2.6 anion gap of 29 and a bicarb of 23. 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 Nephrology was consulted, as well as IR for tunneled HD catheter  Assessment & Plan:   Principal Problem:   Acute kidney injury ([place]) Active Problems:   Hypokalemia   Hypocalcemia   Hypomagnesemia   Nausea and vomiting  Acute kidney injury due to obstructive uropathy Uremia N/V - Nausea vomiting likely due to uremic symptoms, now resolved. - IR was consulted and HD tunneled catheter placed on 05/12/2020. - Nephrology following. Per nephrology, continue with HD as needed. If HD is needed through this week, then plan for arranging outpt HD center  Obstructive uropathy/BPH Hematuria Urinary retention -appreciate urology assistance - continues to have gross hematuria; continue foley, urology to follow up outpt - urology to follow up retention outpt. - s/p 4 units pRBC; urologyhas changed cath to hematuria cath - Currently remains stable with cath  Syncope - episode while having BM this AM; likely vasovagal - check orthostatics: tachy throughout test, but BP ok. Will get small amount of fluids. -orthostatics ok - no further episodes noted  Hypokalemia Hypomagnesemia Hypocalcemia Hyponatremia Secondary hyperphosphatemia -Lytes stable this AM, potassium normalized -cont to follow lytes and replace as needed  Anemia of chronic  renal disease vomiting: Fe-deficiency Acute blood loss anemia - Fe mildly low, s/p IV Fesupplement - blood loss from hematuria - s/p 4 units pRBC; -Hgb remains stable  Leukocytosis: -mildly elevated; no fever, follow  Elevated lipase - Likely due to nausea and vomiting he has no pancreatitis. - Tolerating diet  Prolonged QTc -Recent EKG reviewed, QTC noted to be over 590 -Will repeat EKG for interval change  DVT prophylaxis: SCD's Code Status: Full Family Communication: Pt in room, family not at bedside  Status is: Inpatient  Remains inpatient appropriate because:Unsafe d/c plan, IV treatments appropriate due to intensity of illness or inability to take PO and Inpatient level of care appropriate due to severity of illness   Dispo: The patient is from: Home              Anticipated d/c is to: Home              Anticipated d/c date is: > 3 days              Patient currently is not medically stable to d/c.       Consultants:   Nephrology  Procedures:     Antimicrobials: Anti-infectives (From admission, onward)   Start     Dose/Rate Route Frequency Ordered Stop   05/13/20 0800  aztreonam (AZACTAM) 0.5 g in dextrose 5 % 50 mL IVPB  Status:  Discontinued        0.5 g 100 mL/hr over 30 Minutes Intravenous Every 12 hours 05/12/20 1810 05/14/20 1126   05/12/20 1545  aztreonam (AZACTAM) 1 g in sodium chloride 0.9 % 100 mL IVPB        1 g 200 mL/hr over 30  Minutes Intravenous  Once 05/12/20 1533 05/12/20 2237       Subjective: Denies chest pain or sob. Does feel more depressed about his renal function  Objective: Vitals:   05/22/20 2011 05/23/20 0623 05/23/20 0803 05/23/20 1307  BP: 122/72 134/81 (!) 147/78 119/74  Pulse: (!) 105 94 (!) 104 (!) 110  Resp: 16 16 18 18   Temp: 98.9 F (37.2 C) 98.1 F (36.7 C) 98.6 F (37 C) 98.2 F (36.8 C)  TempSrc: Oral Oral Oral Oral  SpO2: 99% 99% 95% 99%  Weight:  99.9 kg    Height:         Intake/Output Summary (Last 24 hours) at 05/23/2020 1630 Last data filed at 05/23/2020 1401 Gross per 24 hour  Intake 720 ml  Output 551 ml  Net 169 ml   Filed Weights   05/21/20 1107 05/22/20 0537 05/23/20 0623  Weight: 102.6 kg 98.4 kg 99.9 kg    Examination:  General exam: Appears calm and comfortable  Respiratory system: Clear to auscultation. Respiratory effort normal. Cardiovascular system: S1 & S2 heard, Regular Gastrointestinal system: Abdomen is nondistended, soft and nontender. No organomegaly or masses felt. Normal bowel sounds heard. Central nervous system: Alert and oriented. No focal neurological deficits. Extremities: Symmetric 5 x 5 power. Skin: No rashes, lesions Psychiatry: Judgement and insight appear normal. Mood & affect appropriate.   Data Reviewed: I have personally reviewed following labs and imaging studies  CBC: Recent Labs  Lab 05/19/20 0418 05/20/20 0358 05/21/20 0500 05/22/20 0542 05/23/20 0647  WBC 12.2* 12.9* 11.6* 10.7* 12.3*  NEUTROABS 7.4 8.0* 7.2 7.8* 8.6*  HGB 9.1* 9.7* 9.0* 8.8* 9.4*  HCT 28.7* 30.5* 29.1* 28.5* 29.8*  MCV 88.9 90.2 91.8 91.1 91.7  PLT 211 232 226 191 683   Basic Metabolic Panel: Recent Labs  Lab 05/19/20 0418 05/20/20 0358 05/21/20 0500 05/22/20 0542 05/23/20 0647  NA 134* 135 136 134* 134*  K 3.7 3.8 3.8 3.9 4.2  CL 104 104 105 100 101  CO2 20* 19* 19* 23 18*  GLUCOSE 91 99 88 98 89  BUN 54* 64* 70* 49* 62*  CREATININE 7.47* 7.60* 8.29* 5.99* 6.85*  CALCIUM 7.8* 8.3* 8.0* 7.9* 8.2*  MG 1.7 1.8 1.8 1.9 2.0  PHOS 3.0 2.7 4.2 3.5 4.6   GFR: Estimated Creatinine Clearance: 12.8 mL/min (A) (by C-G formula based on SCr of 6.85 mg/dL (H)). Liver Function Tests: Recent Labs  Lab 05/19/20 0418 05/20/20 0358 05/21/20 0500 05/22/20 0542 05/23/20 0647  ALBUMIN 2.8* 3.1* 2.8* 2.7* 2.9*   No results for input(s): LIPASE, AMYLASE in the last 168 hours. No results for input(s): AMMONIA in the last  168 hours. Coagulation Profile: No results for input(s): INR, PROTIME in the last 168 hours. Cardiac Enzymes: No results for input(s): CKTOTAL, CKMB, CKMBINDEX, TROPONINI in the last 168 hours. BNP (last 3 results) No results for input(s): PROBNP in the last 8760 hours. HbA1C: No results for input(s): HGBA1C in the last 72 hours. CBG: No results for input(s): GLUCAP in the last 168 hours. Lipid Profile: No results for input(s): CHOL, HDL, LDLCALC, TRIG, CHOLHDL, LDLDIRECT in the last 72 hours. Thyroid Function Tests: No results for input(s): TSH, T4TOTAL, FREET4, T3FREE, THYROIDAB in the last 72 hours. Anemia Panel: No results for input(s): VITAMINB12, FOLATE, FERRITIN, TIBC, IRON, RETICCTPCT in the last 72 hours. Sepsis Labs: No results for input(s): PROCALCITON, LATICACIDVEN in the last 168 hours.  No results found for this or any previous visit (from  the past 240 hour(s)).   Radiology Studies: No results found.  Scheduled Meds: . calcium carbonate  800 mg of elemental calcium Oral TID  . Chlorhexidine Gluconate Cloth  6 each Topical Q0600  . Chlorhexidine Gluconate Cloth  6 each Topical Q0600  . darbepoetin (ARANESP) injection - NON-DIALYSIS  150 mcg Subcutaneous Q Wed-1800  . divalproex  125 mg Oral BID  . docusate sodium  100 mg Oral BID  . feeding supplement (NEPRO CARB STEADY)  237 mL Oral BID BM  . latanoprost  1 drop Both Eyes QHS  . magnesium oxide  400 mg Oral Daily  . melatonin  5 mg Oral QHS  . multivitamin  1 tablet Oral QHS  . psyllium  1 packet Oral Daily  . QUEtiapine  200 mg Oral QHS  . tamsulosin  0.8 mg Oral QPC supper   Continuous Infusions: . sodium chloride 100 mL/hr at 05/16/20 1428  . sodium chloride irrigation       LOS: 12 days   Marylu Lund, MD [facility] Pager On Amion  If 7PM-7AM, please contact night-coverage 05/23/2020, 4:30 PM

## 2020-05-24 DIAGNOSIS — R112 Nausea with vomiting, unspecified: Secondary | ICD-10-CM | POA: Diagnosis not present

## 2020-05-24 DIAGNOSIS — N179 Acute kidney failure, unspecified: Secondary | ICD-10-CM | POA: Diagnosis not present

## 2020-05-24 DIAGNOSIS — R9431 Abnormal electrocardiogram [ECG] [EKG]: Secondary | ICD-10-CM

## 2020-05-24 LAB — RENAL FUNCTION PANEL
Albumin: 2.6 g/dL — ABNORMAL LOW (ref 3.5–5.0)
Anion gap: 12 (ref 5–15)
BUN: 73 mg/dL — ABNORMAL HIGH (ref 8–23)
CO2: 21 mmol/L — ABNORMAL LOW (ref 22–32)
Calcium: 8.2 mg/dL — ABNORMAL LOW (ref 8.9–10.3)
Chloride: 102 mmol/L (ref 98–111)
Creatinine, Ser: 7.62 mg/dL — ABNORMAL HIGH (ref 0.61–1.24)
GFR calc Af Amer: 8 mL/min — ABNORMAL LOW (ref >60)
GFR calc non Af Amer: 7 mL/min — ABNORMAL LOW (ref >60)
Glucose, Bld: 92 mg/dL (ref 70–99)
Phosphorus: 4.5 mg/dL (ref 2.5–4.6)
Potassium: 3.6 mmol/L (ref 3.5–5.1)
Sodium: 135 mmol/L (ref 135–145)

## 2020-05-24 MED ORDER — FLUOXETINE HCL 20 MG PO CAPS
40.0000 mg | ORAL_CAPSULE | Freq: Every day | ORAL | Status: DC
  Administered 2020-05-24 – 2020-06-01 (×9): 40 mg via ORAL
  Filled 2020-05-24 (×10): qty 2

## 2020-05-24 NOTE — Progress Notes (Signed)
PT Cancellation Note  Patient Details Name: Manuel Ortega MRN: [id number] DOB: 01-08-1951   Cancelled Treatment:    Reason Eval/Treat Not Completed: Other (comment) (Pt just got lunch.  Will return as able. )   [name] 05/24/2020, 12:14 PM Emilynn Srinivasan W,PT Acute Rehabilitation Services Pager:  [phone]  Office:  [phone]

## 2020-05-24 NOTE — Progress Notes (Addendum)
Nutrition Follow-up  RD working remotely.  DOCUMENTATION CODES:   Not applicable  INTERVENTION:   -Continue Nepro Shake po BID, each supplement provides 425 kcal and 19 grams protein -Continue renal MVI daily  NUTRITION DIAGNOSIS:   Increased nutrient needs related to acute illness (AKI) as evidenced by estimated needs.  Ongoing  GOAL:   Patient will meet greater than or equal to 90% of their needs  Progressing   MONITOR:   PO intake, Supplement acceptance, Labs, Weight trends, Skin, I & O's  REASON FOR ASSESSMENT:   Malnutrition Screening Tool    ASSESSMENT:   Manuel Ortega is a 69 y.o. male with medical history significant of major depression and hypertension,  not taking any medication,  has no primary care physician, he has no medical care in the last 5 years presenting in the [facility] emergency department with nausea and vomiting for past 4 days  Reviewed I/O's: -831 ml x 24 hours and -4.5 L since admission  UOP: 2.2 L x 24 hours  Attempted to speak with pt via phone call to hospital room phone, however, no answer.  Per nephrology notes, last HD treatment on 05/15/20 and continues to be hopeful for renal recovery, however, too soon to determine long term HD needs. Plan to change to [facility] if pt does not demonstrate signs of recovery in the next 48 hours.   Pt remains with good appetite; noted meal completion 90-100%. He continues to accept renal MVI and Nepro supplements.  Medications reviewed and include colace, mag-ox, and melatonin.   Labs reviewed.  Diet Order:   Diet Order            Diet renal with fluid restriction Fluid restriction: 1200 mL Fluid; Room service appropriate? Yes; Fluid consistency: Thin  Diet effective now                 EDUCATION NEEDS:   No education needs have been identified at this time  Skin:  Skin Assessment: Reviewed RN Assessment  Last BM:  05/23/20  Height:   Ht Readings from Last 1 Encounters:  05/12/20 6\' 1"  (1.854  m)    Weight:   Wt Readings from Last 1 Encounters:  05/23/20 99.9 kg    Ideal Body Weight:  83.6 kg  BMI:  Body mass index is 29.06 kg/m.  Estimated Nutritional Needs:   Kcal:  8412-8208  Protein:  110-125 grams  Fluid:  1000 ml + UOP    Loistine Chance, RD, LDN, [place] Registered Dietitian II Certified Diabetes Care and Education Specialist Please refer to [facility] for RD and/or RD on-call/weekend/after hours pager

## 2020-05-24 NOTE — Progress Notes (Signed)
Physical Therapy Treatment Patient Details Name: Manuel Ortega MRN: [id number] DOB: [birth year] Today's Date: 05/24/2020    History of Present Illness 69 y.o. male with medical history significant of major depression and hypertension,  not taking any medication,  has no primary care physician, he has no medical care in the last 5 years presenting in the [facility] emergency department with nausea and vomiting. He denies any history of kidney disease. Pt found to have acute kidney injury due to obstructive uropathy and uremia. HD initiated.     PT Comments    Pt making good progress. Did well with rollator and pt prefers since he knows he can sit down if he becomes dizzy. Able to walk length of hall and back with only mild dizziness after sitting back down which passed after several minutes.    Follow Up Recommendations  Home health PT     Equipment Recommendations  Other (comment);3in1 (PT) (rollator)    Recommendations for Other Services OT consult     Precautions / Restrictions Precautions Precautions: Fall    Mobility  Bed Mobility               General bed mobility comments: Pt up in chair  Transfers Overall transfer level: Needs assistance Equipment used: 4-wheeled walker Transfers: Sit to/from Stand Sit to Stand: Min guard         General transfer comment: Assist for safey  Ambulation/Gait Ambulation/Gait assistance: Min guard;Supervision Gait Distance (Feet): 350 Feet Assistive device: 4-wheeled walker Gait Pattern/deviations: Step-through pattern;Decreased stride length Gait velocity: decr Gait velocity interpretation: 1.31 - 2.62 ft/sec, indicative of limited community ambulator General Gait Details: Pt with good use of rollator. Less anxious knowing he can stop and sit if he gets dizzy. 2 short standing rest breaks.   Stairs             Wheelchair Mobility    Modified Rankin (Stroke Patients Only)       Balance Overall balance assessment:  Needs assistance Sitting-balance support: No upper extremity supported;Feet supported Sitting balance-Leahy Scale: Fair     Standing balance support: No upper extremity supported;During functional activity Standing balance-Leahy Scale: Fair Standing balance comment: needs UE support with dynamic activities                            Cognition Arousal/Alertness: Awake/alert Behavior During Therapy: Anxious Overall Cognitive Status: No family/caregiver present to determine baseline cognitive functioning                                 General Comments: anxious about mobility      Exercises      General Comments General comments (skin integrity, edema, etc.): Mild dizziness after he returned to sitting. Improved after a few minutes      Pertinent Vitals/Pain Pain Assessment: Faces Faces Pain Scale: No hurt    Home Living                      Prior Function            PT Goals (current goals can now be found in the care plan section) Acute Rehab PT Goals Patient Stated Goal: To improve mobility and find more assistance Progress towards PT goals: Progressing toward goals    Frequency    Min 3X/week      PT Plan Current plan remains appropriate  Co-evaluation              AM-PAC PT "6 Clicks" Mobility   Outcome Measure  Help needed turning from your back to your side while in a flat bed without using bedrails?: None Help needed moving from lying on your back to sitting on the side of a flat bed without using bedrails?: A Little Help needed moving to and from a bed to a chair (including a wheelchair)?: A Little Help needed standing up from a chair using your arms (e.g., wheelchair or bedside chair)?: A Little Help needed to walk in hospital room?: A Little Help needed climbing 3-5 steps with a railing? : A Little 6 Click Score: 19    End of Session Equipment Utilized During Treatment: Gait belt Activity Tolerance:  Patient tolerated treatment well Patient left: with call bell/phone within reach;in chair;with chair alarm set Nurse Communication: Mobility status PT Visit Diagnosis: Unsteadiness on feet (R26.81);Other abnormalities of gait and mobility (R26.89);Muscle weakness (generalized) (M62.81)     Time: 2836-6294 PT Time Calculation (min) (ACUTE ONLY): 18 min  Charges:  $Gait Training: 8-22 mins                     [place] Pager [phone] Office [place] 05/24/2020, 5:05 PM

## 2020-05-24 NOTE — Plan of Care (Signed)
  Problem: Education: ?Goal: Knowledge of General Education information will improve ?Description: Including pain rating scale, medication(s)/side effects and non-pharmacologic comfort measures ?Outcome: Progressing ?  ?Problem: Health Behavior/Discharge Planning: ?Goal: Ability to manage health-related needs will improve ?Outcome: Progressing ?  ?Problem: Clinical Measurements: ?Goal: Ability to maintain clinical measurements within normal limits will improve ?Outcome: Progressing ?Goal: Will remain free from infection ?Outcome: Progressing ?Goal: Diagnostic test results will improve ?Outcome: Progressing ?Goal: Respiratory complications will improve ?Outcome: Progressing ?Goal: Cardiovascular complication will be avoided ?Outcome: Progressing ?  ?Problem: Activity: ?Goal: Risk for activity intolerance will decrease ?Outcome: Progressing ?  ?Problem: Coping: ?Goal: Level of anxiety will decrease ?Outcome: Progressing ?  ?Problem: Elimination: ?Goal: Will not experience complications related to bowel motility ?Outcome: Progressing ?Goal: Will not experience complications related to urinary retention ?Outcome: Progressing ?  ?Problem: Safety: ?Goal: Ability to remain free from injury will improve ?Outcome: Progressing ?  ?

## 2020-05-24 NOTE — Progress Notes (Signed)
[facility] Progress Note     Assessment/ Plan:   1. AKI from obstructive uropathy,uremia present without significant initial improvement with placement of foley. Did HD on 8/1 for uremia with clinical improvement. second HD treatment on 8/3  LastHD #3 8/9 RIJ temp cath  Too soon to declare what his long term dialysis needs will be- but if needs further dialysis later this weekthenwould plan [facility] and try to arrange OP HD for AKI. I spoke with the pt today and he wants to wait till mon before converting the catheter.  Great UOP which is promising but clearance is not there yet; planning on holding HD for now but if numbers do not peak by Mon then will convert and HD. I've already spoken with the renal navigator and she is working on getting him a seat outpt HD as well in case renal function doesn't return this hospitalization.  If obstructive uropathy has been present for a long time then this may be permanent req RRT indefinitely until transplant.  2. Obstructive uropathy presumed from BPH, severe, now with Foley catheter drainingclear now - urologyinvolved - CBI 3. Elevated PSA from #2, probably, maintain foley- uro f/u-  4. Hypokalemia, likely due to gastrointestinal losses; Supplement PRN- High K bath  5. Hypocalcemia from severity of renal failure, improving 6. Hypomagnesemia from gastrointestinal losses, on supplement 7. Hyperphosphatemia secondary to #1- better 8. Metabolic acidosis and alkalosis, from renal failure andgatriclosses+ PO NaHCO3 intake; AG down- resolved 9. Depression 10. Normocytic anemia, TSAT 26%- gave IV iron- feraheme times 2 doses - discovered to have a hgb under 6- Got 2 units PRBC on 8/3- - dosed with ESA as well-- more blood on 8/5.Presumed GU source- hgb better today  Subjective:   Feeling good; no events overnight Denies f/c/n/v/dyspnea; states his UOP is still great and clear Has very  good appetite   Objective:   BP 132/74 (BP Location: Left Arm)   Pulse 100   Temp 98.4 F (36.9 C) (Oral)   Resp 16   Ht 6\' 1"  (1.854 m)   Wt 99.9 kg   SpO2 97%   BMI 29.06 kg/m   Intake/Output Summary (Last 24 hours) at 05/24/2020 0805 Last data filed at 05/24/2020 0636 Gross per 24 hour  Intake 1320 ml  Output 2151 ml  Net -831 ml   Weight change:   Physical Exam: General: nad- Right sided vascath in place- Placed 8/1 Heart: RRR, no rub Lungs: clear Abdomen: soft, NABS Extremities: no edema GU: foley drainingclear now Neuro: better  Imaging: No results found.  Labs: BMET Recent Labs  Lab 05/18/20 0500 05/19/20 0418 05/20/20 0358 05/21/20 0500 05/22/20 0542 05/23/20 0647 05/24/20 0443  NA 134* 134* 135 136 134* 134* 135  K 3.5 3.7 3.8 3.8 3.9 4.2 3.6  CL 101 104 104 105 100 101 102  CO2 21* 20* 19* 19* 23 18* 21*  GLUCOSE 94 91 99 88 98 89 92  BUN 50* 54* 64* 70* 49* 62* 73*  CREATININE 7.02* 7.47* 7.60* 8.29* 5.99* 6.85* 7.62*  CALCIUM 7.6* 7.8* 8.3* 8.0* 7.9* 8.2* 8.2*  PHOS 2.5 3.0 2.7 4.2 3.5 4.6 4.5   CBC Recent Labs  Lab 05/20/20 0358 05/21/20 0500 05/22/20 0542 05/23/20 0647  WBC 12.9* 11.6* 10.7* 12.3*  NEUTROABS 8.0* 7.2 7.8* 8.6*  HGB 9.7* 9.0* 8.8* 9.4*  HCT 30.5* 29.1* 28.5* 29.8*  MCV 90.2 91.8 91.1 91.7  PLT 232 226 191 204    Medications:    .  calcium carbonate  800 mg of elemental calcium Oral TID  . Chlorhexidine Gluconate Cloth  6 each Topical Q0600  . Chlorhexidine Gluconate Cloth  6 each Topical Q0600  . darbepoetin (ARANESP) injection - NON-DIALYSIS  150 mcg Subcutaneous Q Wed-1800  . divalproex  125 mg Oral BID  . docusate sodium  100 mg Oral BID  . feeding supplement (NEPRO CARB STEADY)  237 mL Oral BID BM  . FLUoxetine  40 mg Oral Daily  . latanoprost  1 drop Both Eyes QHS  . magnesium oxide  400 mg Oral Daily  . melatonin  5 mg Oral QHS  . multivitamin  1 tablet Oral QHS  . psyllium  1 packet Oral Daily   . QUEtiapine  200 mg Oral QHS  . tamsulosin  0.8 mg Oral QPC supper      Otelia Santee, MD 05/24/2020, 8:05 AM

## 2020-05-24 NOTE — Progress Notes (Signed)
PROGRESS NOTE    Manuel Ortega  GMW:[id number] DOB: Sep 21, 1951 DOA: 05/11/2020 PCP: System, Pcp Not In    Brief Narrative:  69 y.o.malepast medical history significant of depression and hypertension noncompliant with his medication with no primary care doctor for over the last 5 years presented to the [facility] long hospital with nausea vomiting that started 4 days prior to admission. In the ED he was found to have a white count of 13 BUN of 127 creatinine of 18 potassium was 2.6 anion gap of 29 and a bicarb of 23. 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 Nephrology was consulted, as well as IR for tunneled HD catheter  Assessment & Plan:   Principal Problem:   Acute kidney injury ([place]) Active Problems:   Hypokalemia   Hypocalcemia   Hypomagnesemia   Nausea and vomiting  Acute kidney injury due to obstructive uropathy Uremia N/V - Nausea vomiting likely due to uremic symptoms, now resolved. - IR was consulted and HD tunneled catheter placed on 05/12/2020. - Nephrology following.  -Creatinine is trending up, to over 7 today.  Patient continues to void well, planning to hold further hemodialysis for now per nephrology.  Per nephro, if renal function does not peak by Monday then will convert and transition to HD  Obstructive uropathy/BPH Hematuria Urinary retention -appreciate urology assistance - urology to follow up retention outpt. - s/p 4 units pRBC; urologyhas changed cath to hematuria cath - Currently remains stable with Foley cath  Syncope - episode while having BM this AM; likely vasovagal - check orthostatics: tachy throughout test, but BP ok. Will get small amount of fluids. -orthostatics ok -Remained stable thus far  Hypokalemia Hypomagnesemia Hypocalcemia Hyponatremia Secondary hyperphosphatemia -Lytes stable this AM, potassium normalized -cont to follow lytes and replace  as needed  Anemia of chronic renal disease vomiting: Fe-deficiency Acute blood loss anemia - Fe mildly low, s/p IV Fesupplement - blood loss from hematuria - s/p 4 units pRBC; -Hgb remains stable  Leukocytosis: -mildly elevated; no fever, follow  Elevated lipase - Likely due to nausea and vomiting he has no pancreatitis. - Tolerating diet  Prolonged QTc, resolved -Recent EKG reviewed, QTC noted to be over 590 -Reviewed recent EKG, QTC has normalized  Depression -Patient had noted feeling depressed regarding his worsening renal function and the prospects of needing hemodialysis long-term -EKG reviewed as per above, previously prolonged QTC has now normalized, have started SSRI.  DVT prophylaxis: SCD's Code Status: Full Family Communication: Pt in room, family not at bedside  Status is: Inpatient  Remains inpatient appropriate because:Unsafe d/c plan, IV treatments appropriate due to intensity of illness or inability to take PO and Inpatient level of care appropriate due to severity of illness   Dispo: The patient is from: Home              Anticipated d/c is to: Home              Anticipated d/c date is: > 3 days              Patient currently is not medically stable to d/c.       Consultants:   Nephrology  Procedures:     Antimicrobials: Anti-infectives (From admission, onward)   Start     Dose/Rate Route Frequency Ordered Stop   05/13/20 0800  aztreonam (AZACTAM) 0.5 g in dextrose 5 % 50 mL IVPB  Status:  Discontinued        0.5 g 100  mL/hr over 30 Minutes Intravenous Every 12 hours 05/12/20 1810 05/14/20 1126   05/12/20 1545  aztreonam (AZACTAM) 1 g in sodium chloride 0.9 % 100 mL IVPB        1 g 200 mL/hr over 30 Minutes Intravenous  Once 05/12/20 1533 05/12/20 2237      Subjective: Patient reports feeling tired this morning, did not sleep well last night  Objective: Vitals:   05/24/20 0018 05/24/20 0632  05/24/20 0735 05/24/20 1116  BP: 132/70 140/74 132/74 (!) 144/69  Pulse: (!) 103 (!) 102 100 (!) 102  Resp: 19 16 16 18   Temp: 97.8 F (36.6 C) 98.4 F (36.9 C) 98.4 F (36.9 C) 97.9 F (36.6 C)  TempSrc: Oral Oral Oral Oral  SpO2: 97% 99% 97% 99%  Weight:      Height:        Intake/Output Summary (Last 24 hours) at 05/24/2020 1531 Last data filed at 05/24/2020 3009 Gross per 24 hour  Intake 1180 ml  Output 1600 ml  Net -420 ml   Filed Weights   05/21/20 1107 05/22/20 0537 05/23/20 0623  Weight: 102.6 kg 98.4 kg 99.9 kg    Examination: General exam: Awake, laying in bed, in nad Respiratory system: Normal respiratory effort, no wheezing Cardiovascular system: regular rate, s1, s2 Gastrointestinal system: Soft, nondistended, positive BS Central nervous system: CN2-12 grossly intact, strength intact Extremities: Perfused, no clubbing Skin: Normal skin turgor, no notable skin lesions seen Psychiatry: Mood normal // no visual hallucinations   Data Reviewed: I have personally reviewed following labs and imaging studies  CBC: Recent Labs  Lab 05/19/20 0418 05/20/20 0358 05/21/20 0500 05/22/20 0542 05/23/20 0647  WBC 12.2* 12.9* 11.6* 10.7* 12.3*  NEUTROABS 7.4 8.0* 7.2 7.8* 8.6*  HGB 9.1* 9.7* 9.0* 8.8* 9.4*  HCT 28.7* 30.5* 29.1* 28.5* 29.8*  MCV 88.9 90.2 91.8 91.1 91.7  PLT 211 232 226 191 233   Basic Metabolic Panel: Recent Labs  Lab 05/19/20 0418 05/19/20 0418 05/20/20 0358 05/21/20 0500 05/22/20 0542 05/23/20 0647 05/24/20 0443  NA 134*   < > 135 136 134* 134* 135  K 3.7   < > 3.8 3.8 3.9 4.2 3.6  CL 104   < > 104 105 100 101 102  CO2 20*   < > 19* 19* 23 18* 21*  GLUCOSE 91   < > 99 88 98 89 92  BUN 54*   < > 64* 70* 49* 62* 73*  CREATININE 7.47*   < > 7.60* 8.29* 5.99* 6.85* 7.62*  CALCIUM 7.8*   < > 8.3* 8.0* 7.9* 8.2* 8.2*  MG 1.7  --  1.8 1.8 1.9 2.0  --   PHOS 3.0   < > 2.7 4.2 3.5 4.6 4.5   < > = values in this interval not displayed.    GFR: Estimated Creatinine Clearance: 11.5 mL/min (A) (by C-G formula based on SCr of 7.62 mg/dL (H)). Liver Function Tests: Recent Labs  Lab 05/20/20 0358 05/21/20 0500 05/22/20 0542 05/23/20 0647 05/24/20 0443  ALBUMIN 3.1* 2.8* 2.7* 2.9* 2.6*   No results for input(s): LIPASE, AMYLASE in the last 168 hours. No results for input(s): AMMONIA in the last 168 hours. Coagulation Profile: No results for input(s): INR, PROTIME in the last 168 hours. Cardiac Enzymes: No results for input(s): CKTOTAL, CKMB, CKMBINDEX, TROPONINI in the last 168 hours. BNP (last 3 results) No results for input(s): PROBNP in the last 8760 hours. HbA1C: No results for input(s): HGBA1C in  the last 72 hours. CBG: No results for input(s): GLUCAP in the last 168 hours. Lipid Profile: No results for input(s): CHOL, HDL, LDLCALC, TRIG, CHOLHDL, LDLDIRECT in the last 72 hours. Thyroid Function Tests: No results for input(s): TSH, T4TOTAL, FREET4, T3FREE, THYROIDAB in the last 72 hours. Anemia Panel: No results for input(s): VITAMINB12, FOLATE, FERRITIN, TIBC, IRON, RETICCTPCT in the last 72 hours. Sepsis Labs: No results for input(s): PROCALCITON, LATICACIDVEN in the last 168 hours.  No results found for this or any previous visit (from the past 240 hour(s)).   Radiology Studies: No results found.  Scheduled Meds: . calcium carbonate  800 mg of elemental calcium Oral TID  . Chlorhexidine Gluconate Cloth  6 each Topical Q0600  . Chlorhexidine Gluconate Cloth  6 each Topical Q0600  . darbepoetin (ARANESP) injection - NON-DIALYSIS  150 mcg Subcutaneous Q Wed-1800  . divalproex  125 mg Oral BID  . docusate sodium  100 mg Oral BID  . feeding supplement (NEPRO CARB STEADY)  237 mL Oral BID BM  . FLUoxetine  40 mg Oral Daily  . latanoprost  1 drop Both Eyes QHS  . magnesium oxide  400 mg Oral Daily  . melatonin  5 mg Oral QHS  . multivitamin  1 tablet Oral QHS  . psyllium  1 packet Oral Daily  .  QUEtiapine  200 mg Oral QHS  . tamsulosin  0.8 mg Oral QPC supper   Continuous Infusions: . sodium chloride 100 mL/hr at 05/16/20 1428  . sodium chloride irrigation       LOS: 13 days   Marylu Lund, MD [facility] Pager On Amion  If 7PM-7AM, please contact night-coverage 05/24/2020, 3:31 PM

## 2020-05-25 DIAGNOSIS — N179 Acute kidney failure, unspecified: Secondary | ICD-10-CM | POA: Diagnosis not present

## 2020-05-25 LAB — RENAL FUNCTION PANEL
Albumin: 2.7 g/dL — ABNORMAL LOW (ref 3.5–5.0)
Anion gap: 12 (ref 5–15)
BUN: 87 mg/dL — ABNORMAL HIGH (ref 8–23)
CO2: 20 mmol/L — ABNORMAL LOW (ref 22–32)
Calcium: 8.4 mg/dL — ABNORMAL LOW (ref 8.9–10.3)
Chloride: 102 mmol/L (ref 98–111)
Creatinine, Ser: 8.03 mg/dL — ABNORMAL HIGH (ref 0.61–1.24)
GFR calc Af Amer: 7 mL/min — ABNORMAL LOW (ref >60)
GFR calc non Af Amer: 6 mL/min — ABNORMAL LOW (ref >60)
Glucose, Bld: 86 mg/dL (ref 70–99)
Phosphorus: 5 mg/dL — ABNORMAL HIGH (ref 2.5–4.6)
Potassium: 3.8 mmol/L (ref 3.5–5.1)
Sodium: 134 mmol/L — ABNORMAL LOW (ref 135–145)

## 2020-05-25 MED ORDER — DARBEPOETIN ALFA 150 MCG/0.3ML IJ SOSY
150.0000 ug | PREFILLED_SYRINGE | Freq: Once | INTRAMUSCULAR | Status: AC
  Administered 2020-05-25: 150 ug via SUBCUTANEOUS
  Filled 2020-05-25: qty 0.3

## 2020-05-25 NOTE — Discharge Planning (Signed)
Patient referred to [facility]. Patient referred as AKI, Physician will advise if status changes to ESRD. Awaiting chair time.

## 2020-05-25 NOTE — Progress Notes (Signed)
PROGRESS NOTE    Manuel Ortega  QAS:[id number] DOB: 17-Jun-1951 DOA: 05/11/2020 PCP: System, Pcp Not In    Brief Narrative:  69 y.o.malepast medical history significant of depression and hypertension noncompliant with his medication with no primary care doctor for over the last 5 years presented to the [facility] long hospital with nausea vomiting that started 4 days prior to admission. In the ED he was found to have a white count of 13 BUN of 127 creatinine of 18 potassium was 2.6 anion gap of 29 and a bicarb of 23. 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 Nephrology was consulted, as well as IR for tunneled HD catheter  Assessment & Plan:   Principal Problem:   Acute kidney injury ([place]) Active Problems:   Hypokalemia   Hypocalcemia   Hypomagnesemia   Nausea and vomiting  Acute kidney injury due to obstructive uropathy Uremia N/V - Nausea vomiting likely due to uremic symptoms, now resolved. - IR was consulted and HD tunneled catheter placed on 05/12/2020. - Nephrology following.  -Creatinine is trending up, to over 8 today.  Patient continues to void well, planning to hold further hemodialysis for now per nephrology.   -Patient was seen at bedside with Nephrology, discussed with Dr. Augustin Coupe. Pt continuing to void. If there is no sign of recovery by Monday, then plan for arranging outpt HD  Obstructive uropathy/BPH Hematuria Urinary retention -appreciate urology assistance - urology to follow up retention outpt. - s/p 4 units pRBC; urologyhas changed cath to hematuria cath - Currently remains stable with Foley cath -cont to follow CBC  Syncope - episode while having BM this AM; likely vasovagal - check orthostatics: tachy throughout test, but BP ok. Will get small amount of fluids. -orthostatics ok -Remained stable thus far  Hypokalemia Hypomagnesemia Hypocalcemia Hyponatremia Secondary  hyperphosphatemia -Lytes stable this AM, potassium normalized -cont to follow lytes and replace as needed  Anemia of chronic renal disease vomiting: Fe-deficiency Acute blood loss anemia - Fe mildly low, s/p IV Fesupplement - blood loss from hematuria - s/p 4 units pRBC; -Hgb remains stable  Leukocytosis: -mildly elevated; no fever, follow  Elevated lipase - Likely due to nausea and vomiting he has no pancreatitis. - Continues to tolerate diet  Prolonged QTc, resolved -Recent EKG reviewed, QTC noted to be over 590 -Reviewed recent EKG, QTC has normalized  Depression -Patient had noted feeling depressed regarding his worsening renal function and the prospects of needing hemodialysis long-term -EKG reviewed as per above, previously prolonged QTC has now normalized, have started SSRI.  DVT prophylaxis: SCD's Code Status: Full Family Communication: Pt in room, family not at bedside  Status is: Inpatient  Remains inpatient appropriate because:Unsafe d/c plan, IV treatments appropriate due to intensity of illness or inability to take PO and Inpatient level of care appropriate due to severity of illness   Dispo: The patient is from: Home              Anticipated d/c is to: Home              Anticipated d/c date is: > 3 days              Patient currently is not medically stable to d/c.    Consultants:   Nephrology  Procedures:     Antimicrobials: Anti-infectives (From admission, onward)   Start     Dose/Rate Route Frequency Ordered Stop   05/13/20 0800  aztreonam (AZACTAM) 0.5 g in dextrose 5 %  50 mL IVPB  Status:  Discontinued        0.5 g 100 mL/hr over 30 Minutes Intravenous Every 12 hours 05/12/20 1810 05/14/20 1126   05/12/20 1545  aztreonam (AZACTAM) 1 g in sodium chloride 0.9 % 100 mL IVPB        1 g 200 mL/hr over 30 Minutes Intravenous  Once 05/12/20 1533 05/12/20 2237      Subjective: Without chest pains or  sob  Objective: Vitals:   05/24/20 2002 05/25/20 0609 05/25/20 0617 05/25/20 0735  BP: (!) 142/70 116/78  129/72  Pulse: (!) 102 87  93  Resp: 18 19  15   Temp: 98.2 F (36.8 C) 98.1 F (36.7 C)  98 F (36.7 C)  TempSrc: Oral Oral  Oral  SpO2: 98% 99%  97%  Weight:   100.6 kg   Height:        Intake/Output Summary (Last 24 hours) at 05/25/2020 1438 Last data filed at 05/25/2020 1251 Gross per 24 hour  Intake 840 ml  Output 2651 ml  Net -1811 ml   Filed Weights   05/22/20 0537 05/23/20 0623 05/25/20 0617  Weight: 98.4 kg 99.9 kg 100.6 kg    Examination: General exam: Conversant, in no acute distress Respiratory system: normal chest rise, clear, no audible wheezing Cardiovascular system: regular rhythm, s1-s2 Gastrointestinal system: Nondistended, nontender, pos BS Central nervous system: No seizures, no tremors Extremities: No cyanosis, no joint deformities Skin: No rashes, no pallor Psychiatry: Affect normal // no auditory hallucinations   Data Reviewed: I have personally reviewed following labs and imaging studies  CBC: Recent Labs  Lab 05/19/20 0418 05/20/20 0358 05/21/20 0500 05/22/20 0542 05/23/20 0647  WBC 12.2* 12.9* 11.6* 10.7* 12.3*  NEUTROABS 7.4 8.0* 7.2 7.8* 8.6*  HGB 9.1* 9.7* 9.0* 8.8* 9.4*  HCT 28.7* 30.5* 29.1* 28.5* 29.8*  MCV 88.9 90.2 91.8 91.1 91.7  PLT 211 232 226 191 811   Basic Metabolic Panel: Recent Labs  Lab 05/19/20 0418 05/19/20 0418 05/20/20 0358 05/20/20 0358 05/21/20 0500 05/22/20 0542 05/23/20 0647 05/24/20 0443 05/25/20 0446  NA 134*   < > 135   < > 136 134* 134* 135 134*  K 3.7   < > 3.8   < > 3.8 3.9 4.2 3.6 3.8  CL 104   < > 104   < > 105 100 101 102 102  CO2 20*   < > 19*   < > 19* 23 18* 21* 20*  GLUCOSE 91   < > 99   < > 88 98 89 92 86  BUN 54*   < > 64*   < > 70* 49* 62* 73* 87*  CREATININE 7.47*   < > 7.60*   < > 8.29* 5.99* 6.85* 7.62* 8.03*  CALCIUM 7.8*   < > 8.3*   < > 8.0* 7.9* 8.2* 8.2* 8.4*  MG  1.7  --  1.8  --  1.8 1.9 2.0  --   --   PHOS 3.0   < > 2.7   < > 4.2 3.5 4.6 4.5 5.0*   < > = values in this interval not displayed.   GFR: Estimated Creatinine Clearance: 11 mL/min (A) (by C-G formula based on SCr of 8.03 mg/dL (H)). Liver Function Tests: Recent Labs  Lab 05/21/20 0500 05/22/20 0542 05/23/20 0647 05/24/20 0443 05/25/20 0446  ALBUMIN 2.8* 2.7* 2.9* 2.6* 2.7*   No results for input(s): LIPASE, AMYLASE in the last 168 hours. No  results for input(s): AMMONIA in the last 168 hours. Coagulation Profile: No results for input(s): INR, PROTIME in the last 168 hours. Cardiac Enzymes: No results for input(s): CKTOTAL, CKMB, CKMBINDEX, TROPONINI in the last 168 hours. BNP (last 3 results) No results for input(s): PROBNP in the last 8760 hours. HbA1C: No results for input(s): HGBA1C in the last 72 hours. CBG: No results for input(s): GLUCAP in the last 168 hours. Lipid Profile: No results for input(s): CHOL, HDL, LDLCALC, TRIG, CHOLHDL, LDLDIRECT in the last 72 hours. Thyroid Function Tests: No results for input(s): TSH, T4TOTAL, FREET4, T3FREE, THYROIDAB in the last 72 hours. Anemia Panel: No results for input(s): VITAMINB12, FOLATE, FERRITIN, TIBC, IRON, RETICCTPCT in the last 72 hours. Sepsis Labs: No results for input(s): PROCALCITON, LATICACIDVEN in the last 168 hours.  No results found for this or any previous visit (from the past 240 hour(s)).   Radiology Studies: No results found.  Scheduled Meds: . calcium carbonate  800 mg of elemental calcium Oral TID  . Chlorhexidine Gluconate Cloth  6 each Topical Q0600  . Chlorhexidine Gluconate Cloth  6 each Topical Q0600  . darbepoetin (ARANESP) injection - NON-DIALYSIS  150 mcg Subcutaneous Once  . divalproex  125 mg Oral BID  . docusate sodium  100 mg Oral BID  . feeding supplement (NEPRO CARB STEADY)  237 mL Oral BID BM  . FLUoxetine  40 mg Oral Daily  . latanoprost  1 drop Both Eyes QHS  . magnesium oxide   400 mg Oral Daily  . melatonin  5 mg Oral QHS  . multivitamin  1 tablet Oral QHS  . psyllium  1 packet Oral Daily  . QUEtiapine  200 mg Oral QHS  . tamsulosin  0.8 mg Oral QPC supper   Continuous Infusions: . sodium chloride 100 mL/hr at 05/16/20 1428  . sodium chloride irrigation       LOS: 14 days   Marylu Lund, MD [facility] Pager On Amion  If 7PM-7AM, please contact night-coverage 05/25/2020, 2:38 PM

## 2020-05-25 NOTE — Evaluation (Signed)
Occupational Therapy Evaluation Patient Details Name: Manuel Ortega MRN: [id number] DOB: [birth year] Today's Date: 05/25/2020    History of Present Illness 69 y.o. male with medical history significant of major depression and hypertension,  not taking any medication,  has no primary care physician, he has no medical care in the last 5 years presenting in the [facility] emergency department with nausea and vomiting for past 4 days.  He also report having decreased appetite,  intermittent cough but denies any weight loss.  He denies any history of kidney disease.   Clinical Impression   PTA, patient was living alone in a private residence and was independent with BADLs/IADLs without use of AE/DME at baseline. With onset of illness, patient reports inability to complete some BADL and IADL tasks prompting visit to ED. Patient currently presents below baseline level of function requiring Min A grossly for LB BADLs and Min guard to supervision A for functional transfers and mobility. Patient also limited by generalized weakness, decreased dynamic standing balance, and increased need for assistance from others. Patient would benefit from continued acute OT services in prep for safe return home with recommendation for HHOT and intermittent supervision/assist.     Follow Up Recommendations  Home health OT    Equipment Recommendations  3 in 1 bedside commode    Recommendations for Other Services       Precautions / Restrictions Precautions Precautions: Fall Restrictions Weight Bearing Restrictions: No Other Position/Activity Restrictions: Monitor HR      Mobility Bed Mobility Overal bed mobility: Needs Assistance Bed Mobility: Supine to Sit     Supine to sit: Supervision Sit to supine: Supervision   General bed mobility comments: Supine <> sit EOB with supervision A and increased time/effort with use of bed rails.   Transfers Overall transfer level: Needs assistance Equipment used: Rolling  walker (2 wheeled) Transfers: Sit to/from Stand Sit to Stand: Min guard         General transfer comment: Sit <> stand x3 trials from various surfaces with Min guard and increased time    Balance Overall balance assessment: Needs assistance Sitting-balance support: No upper extremity supported;Feet supported Sitting balance-Leahy Scale: Fair     Standing balance support: No upper extremity supported Standing balance-Leahy Scale: Fair                             ADL either performed or assessed with clinical judgement   ADL Overall ADL's : Needs assistance/impaired     Grooming: Min guard;Standing           Upper Body Dressing : Set up;Sitting   Lower Body Dressing: Minimal assistance;Sitting/lateral leans;Sit to/from stand   Toilet Transfer: Designer, television/film set Details (indicate cue type and reason): Simulated with transfer to recliner    Toileting - Clothing Manipulation Details (indicate cue type and reason): Patient reports use of bedpan at bed level independently. Encouarged patient to complete toileting on standard commode in bathroom a few times during the day in prep for safe d/c home.      Functional mobility during ADLs: Min guard;Rolling walker General ADL Comments: Min guard for short-distance ambulation in room with use of RW.      Vision Baseline Vision/History: Wears glasses Wears Glasses: At all times Patient Visual Report: No change from baseline Vision Assessment?: No apparent visual deficits     Perception     Praxis      Pertinent Vitals/Pain Pain Assessment: 0-10  Pain Score: 6  Pain Location: foley catheter Pain Descriptors / Indicators: Sore Pain Intervention(s): Monitored during session     Hand Dominance Right   Extremity/Trunk Assessment Upper Extremity Assessment Upper Extremity Assessment: Generalized weakness   Lower Extremity Assessment Lower Extremity Assessment: Defer to PT evaluation   Cervical  / Trunk Assessment Cervical / Trunk Assessment: Normal   Communication Communication Communication: No difficulties   Cognition Arousal/Alertness: Awake/alert Behavior During Therapy: WFL for tasks assessed/performed Overall Cognitive Status: Within Functional Limits for tasks assessed                                     General Comments       Exercises     Shoulder Instructions      Home Living Family/patient expects to be discharged to:: Private residence Living Arrangements: Alone Available Help at Discharge: Family;Available PRN/intermittently (cousin who lives in [place]) Type of Home: House Home Access: Stairs to enter Technical brewer of Steps: 1 Entrance Stairs-Rails: None Home Layout: Two level (has been sleeping on couch, no BR downstairs) Alternate Level Stairs-Number of Steps: flight   Bathroom Shower/Tub: Teacher, early years/pre: Standard     Home Equipment: None          Prior Functioning/Environment Level of Independence: Independent        Comments: 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        OT Problem List: Decreased strength;Decreased activity tolerance;Impaired balance (sitting and/or standing);Decreased knowledge of use of DME or AE      OT Treatment/Interventions: Self-care/ADL training;Therapeutic exercise;Energy conservation;DME and/or AE instruction;Therapeutic activities;Patient/family education;Balance training    OT Goals(Current goals can be found in the care plan section) Acute Rehab OT Goals Patient Stated Goal: To retun home independently  OT Goal Formulation: With patient Time For Goal Achievement: 06/08/20 Potential to Achieve Goals: Good ADL Goals Pt Will Perform Grooming: with modified independence;standing Pt Will Perform Lower Body Bathing: with modified independence;with adaptive equipment;sitting/lateral leans;sit to/from stand Pt Will Perform Lower Body Dressing:  with modified independence;with adaptive equipment;sitting/lateral leans;sit to/from stand Pt Will Transfer to Toilet: with modified independence;ambulating;bedside commode Pt Will Perform Toileting - Clothing Manipulation and hygiene: with modified independence;sitting/lateral leans;sit to/from stand Pt Will Perform Tub/Shower Transfer: Tub transfer;3 in 1;rolling walker Pt/caregiver will Perform Home Exercise Program: Increased strength;Both right and left upper extremity;With theraband;With written HEP provided  OT Frequency: Min 2X/week   Barriers to D/C: Decreased caregiver support  Patient lives alone and needs to be Mod I with BADLs upon d/c       Co-evaluation              AM-PAC OT "6 Clicks" Daily Activity     Outcome Measure Help from another person eating meals?: None Help from another person taking care of personal grooming?: A Little Help from another person toileting, which includes using toliet, bedpan, or urinal?: A Little Help from another person bathing (including washing, rinsing, drying)?: A Lot Help from another person to put on and taking off regular upper body clothing?: None Help from another person to put on and taking off regular lower body clothing?: A Little 6 Click Score: 19   End of Session Equipment Utilized During Treatment: Gait belt;Rolling walker  Activity Tolerance: Patient tolerated treatment well Patient left: in bed;with call bell/phone within reach;with bed alarm set  OT Visit Diagnosis: Muscle weakness (generalized) (  M62.81);Unsteadiness on feet (R26.81)                Time: 5834-6219 OT Time Calculation (min): 27 min Charges:  OT General Charges $OT Visit: 1 Visit OT Evaluation $OT Eval Low Complexity: 1 Low OT Treatments $Self Care/Home Management : 8-22 mins  Avion Patella H. OTR/L Supplemental OT, Department of rehab services [phone]  Clayten Allcock R H. 05/25/2020, 10:29 AM

## 2020-05-25 NOTE — Progress Notes (Signed)
[facility] Progress Note     Assessment/ Plan:   1. AKI from obstructive uropathy,uremia present without significant initial improvement with placement of foley. Did HD on 8/1 for uremia with clinical improvement. second HD treatment on 8/3  LastHD #3 8/9 RIJ temp cath (8/1)  Too soon to declare what his long term dialysis needs will be.   Great UOP which is promising but clearance is not there yet; planning on holding HD for now but if numbers do not peak by Mon then will convert and HD. I've already spoken with the renal navigator and she is working on getting him a seat outpt HD as well in case renal function doesn't return this hospitalization.  He still wants to wait till mon before converting the catheter.   If obstructive uropathy has been present for a long time then this may be permanent req RRT indefinitely until transplant.  2. Obstructive uropathy presumed from BPH, severe, now with Foley catheter drainingclear now - urologyinvolved - CBI 3. Elevated PSA from #2, probably, maintain foley- uro f/u-  4. Hypokalemia, likely due to gastrointestinal losses; Supplement PRN- High K bath  5. Hypocalcemia from severity of renal failure, improving 6. Hypomagnesemia from gastrointestinal losses, on supplement 7. Hyperphosphatemia secondary to #1- better 8. Metabolic acidosis and alkalosis, from renal failure andgatriclosses+ PO NaHCO3 intake; AG down- resolved 9. Depression 10. Normocytic anemia, TSAT 26%- gave IV iron- feraheme times 2 doses - discovered to have a hgb under 6- Got 2 units PRBC on 8/3- - dosed with ESA as well-- more blood on 8/5.Presumed GU source- hgb better today  Subjective:   Feeling good; no events overnight Denies f/c/n/v/dyspnea; states his UOP is still great and clear Has very good appetite; weak and concerned about going home without incr mobility and strength   Objective:   BP 129/72 (BP Location: Left  Arm)   Pulse 93   Temp 98 F (36.7 C) (Oral)   Resp 15   Ht 6\' 1"  (1.854 m)   Wt 100.6 kg   SpO2 97%   BMI 29.26 kg/m   Intake/Output Summary (Last 24 hours) at 05/25/2020 1035 Last data filed at 05/25/2020 0813 Gross per 24 hour  Intake 620 ml  Output 2051 ml  Net -1431 ml   Weight change:   Physical Exam: General: nad- Right sided vascath in place- Placed 8/1 Heart: RRR, no rub Lungs: clear Abdomen: soft, NABS Extremities: no edema GU: foley drainingclear now Neuro: better  Imaging: No results found.  Labs: BMET Recent Labs  Lab 05/19/20 0418 05/20/20 0358 05/21/20 0500 05/22/20 0542 05/23/20 0647 05/24/20 0443 05/25/20 0446  NA 134* 135 136 134* 134* 135 134*  K 3.7 3.8 3.8 3.9 4.2 3.6 3.8  CL 104 104 105 100 101 102 102  CO2 20* 19* 19* 23 18* 21* 20*  GLUCOSE 91 99 88 98 89 92 86  BUN 54* 64* 70* 49* 62* 73* 87*  CREATININE 7.47* 7.60* 8.29* 5.99* 6.85* 7.62* 8.03*  CALCIUM 7.8* 8.3* 8.0* 7.9* 8.2* 8.2* 8.4*  PHOS 3.0 2.7 4.2 3.5 4.6 4.5 5.0*   CBC Recent Labs  Lab 05/20/20 0358 05/21/20 0500 05/22/20 0542 05/23/20 0647  WBC 12.9* 11.6* 10.7* 12.3*  NEUTROABS 8.0* 7.2 7.8* 8.6*  HGB 9.7* 9.0* 8.8* 9.4*  HCT 30.5* 29.1* 28.5* 29.8*  MCV 90.2 91.8 91.1 91.7  PLT 232 226 191 204    Medications:    . calcium carbonate  800 mg of elemental  calcium Oral TID  . Chlorhexidine Gluconate Cloth  6 each Topical Q0600  . Chlorhexidine Gluconate Cloth  6 each Topical Q0600  . darbepoetin (ARANESP) injection - NON-DIALYSIS  150 mcg Subcutaneous Q Wed-1800  . divalproex  125 mg Oral BID  . docusate sodium  100 mg Oral BID  . feeding supplement (NEPRO CARB STEADY)  237 mL Oral BID BM  . FLUoxetine  40 mg Oral Daily  . latanoprost  1 drop Both Eyes QHS  . magnesium oxide  400 mg Oral Daily  . melatonin  5 mg Oral QHS  . multivitamin  1 tablet Oral QHS  . psyllium  1 packet Oral Daily  . QUEtiapine  200 mg Oral QHS  . tamsulosin  0.8 mg Oral  QPC supper      Otelia Santee, MD 05/25/2020, 10:35 AM

## 2020-05-26 DIAGNOSIS — N179 Acute kidney failure, unspecified: Secondary | ICD-10-CM | POA: Diagnosis not present

## 2020-05-26 LAB — RENAL FUNCTION PANEL
Albumin: 2.7 g/dL — ABNORMAL LOW (ref 3.5–5.0)
Anion gap: 13 (ref 5–15)
BUN: 99 mg/dL — ABNORMAL HIGH (ref 8–23)
CO2: 21 mmol/L — ABNORMAL LOW (ref 22–32)
Calcium: 8.4 mg/dL — ABNORMAL LOW (ref 8.9–10.3)
Chloride: 102 mmol/L (ref 98–111)
Creatinine, Ser: 8.4 mg/dL — ABNORMAL HIGH (ref 0.61–1.24)
GFR calc Af Amer: 7 mL/min — ABNORMAL LOW (ref >60)
GFR calc non Af Amer: 6 mL/min — ABNORMAL LOW (ref >60)
Glucose, Bld: 86 mg/dL (ref 70–99)
Phosphorus: 4.9 mg/dL — ABNORMAL HIGH (ref 2.5–4.6)
Potassium: 3.8 mmol/L (ref 3.5–5.1)
Sodium: 136 mmol/L (ref 135–145)

## 2020-05-26 NOTE — Progress Notes (Signed)
PROGRESS NOTE    Manuel Ortega  DJT:[id number] DOB: July 27, 1951 DOA: 05/11/2020 PCP: System, Pcp Not In    Brief Narrative:  69 y.o.malepast medical history significant of depression and hypertension noncompliant with his medication with no primary care doctor for over the last 5 years presented to the [facility] long hospital with nausea vomiting that started 4 days prior to admission. In the ED he was found to have a white count of 13 BUN of 127 creatinine of 18 potassium was 2.6 anion gap of 29 and a bicarb of 23. 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 Nephrology was consulted, as well as IR for tunneled HD catheter  Assessment & Plan:   Principal Problem:   Acute kidney injury ([place]) Active Problems:   Hypokalemia   Hypocalcemia   Hypomagnesemia   Nausea and vomiting  Acute kidney injury due to obstructive uropathy Uremia N/V - Nausea vomiting likely due to uremic symptoms, now resolved. - IR was consulted and HD tunneled catheter placed on 05/12/2020. - Nephrology continues to follow.  -Creatinine is trending up, to over 8.4 today.  Patient continues to void well, planning to hold further hemodialysis for now per nephrology.   -Patient was seen at bedside with Nephrology, discussed with Dr. Augustin Coupe. Pt continuing to void. If there is no sign of recovery by Monday, then plan for arranging outpt HD  Obstructive uropathy/BPH Hematuria Urinary retention -appreciate urology assistance - urology to follow up retention outpt. - s/p 4 units pRBC; urologyhas changed cath to hematuria cath - Currently remains stable with Foley cath -repeat bmet and CBC in AM  Syncope - episode while having BM this AM; likely vasovagal - check orthostatics: tachy throughout test, but BP ok. Will get small amount of fluids. -orthostatics ok -Remained stable thus  far  Hypokalemia Hypomagnesemia Hypocalcemia Hyponatremia Secondary hyperphosphatemia -Lytes stable this AM, potassium normalized -cont to follow lytes and replace as needed  Anemia of chronic renal disease vomiting: Fe-deficiency Acute blood loss anemia - Fe mildly low, s/p IV Fesupplement - blood loss from hematuria - s/p 4 units pRBC; -Hgb remains stable  Leukocytosis: -mildly elevated; no fever, follow  Elevated lipase - Likely due to nausea and vomiting he has no pancreatitis. - Continues to tolerate diet  Prolonged QTc, resolved -Recent EKG reviewed, QTC noted to be over 590 -Reviewed recent EKG, QTC has normalized  Depression -Patient had noted feeling depressed regarding his worsening renal function and the prospects of needing hemodialysis long-term -EKG reviewed as per above, previously prolonged QTC has now normalized, have started SSRI.  DVT prophylaxis: SCD's Code Status: Full Family Communication: Pt in room, family not at bedside  Status is: Inpatient  Remains inpatient appropriate because:Unsafe d/c plan, IV treatments appropriate due to intensity of illness or inability to take PO and Inpatient level of care appropriate due to severity of illness   Dispo: The patient is from: Home              Anticipated d/c is to: Home              Anticipated d/c date is: > 3 days              Patient currently is not medically stable to d/c.    Consultants:   Nephrology  Procedures:     Antimicrobials: Anti-infectives (From admission, onward)   Start     Dose/Rate Route Frequency Ordered Stop   05/13/20 0800  aztreonam (AZACTAM) 0.5 g  in dextrose 5 % 50 mL IVPB  Status:  Discontinued        0.5 g 100 mL/hr over 30 Minutes Intravenous Every 12 hours 05/12/20 1810 05/14/20 1126   05/12/20 1545  aztreonam (AZACTAM) 1 g in sodium chloride 0.9 % 100 mL IVPB        1 g 200 mL/hr over 30 Minutes Intravenous  Once  05/12/20 1533 05/12/20 2237      Subjective: No chest pain this AM  Objective: Vitals:   05/25/20 2015 05/26/20 0549 05/26/20 0553 05/26/20 1208  BP: 136/67 128/71  128/74  Pulse: (!) 106 88  (!) 102  Resp: 19 20  18   Temp: 97.8 F (36.6 C) 97.8 F (36.6 C)  98.8 F (37.1 C)  TempSrc: Oral Oral  Oral  SpO2: 97% 99%  98%  Weight:   101 kg   Height:        Intake/Output Summary (Last 24 hours) at 05/26/2020 1514 Last data filed at 05/26/2020 1300 Gross per 24 hour  Intake 760 ml  Output 1450 ml  Net -690 ml   Filed Weights   05/23/20 0623 05/25/20 0617 05/26/20 0553  Weight: 99.9 kg 100.6 kg 101 kg    Examination: General exam: Conversant, in no acute distress Respiratory system: normal chest rise, clear, no audible wheezing Cardiovascular system: regular rhythm, s1-s2 Gastrointestinal system: Nondistended, nontender, pos BS Central nervous system: No seizures, no tremors Extremities: No cyanosis, no joint deformities Skin: No rashes, no pallor Psychiatry: Affect normal // no auditory hallucinations   Data Reviewed: I have personally reviewed following labs and imaging studies  CBC: Recent Labs  Lab 05/20/20 0358 05/21/20 0500 05/22/20 0542 05/23/20 0647  WBC 12.9* 11.6* 10.7* 12.3*  NEUTROABS 8.0* 7.2 7.8* 8.6*  HGB 9.7* 9.0* 8.8* 9.4*  HCT 30.5* 29.1* 28.5* 29.8*  MCV 90.2 91.8 91.1 91.7  PLT 232 226 191 875   Basic Metabolic Panel: Recent Labs  Lab 05/20/20 0358 05/20/20 0358 05/21/20 0500 05/21/20 0500 05/22/20 0542 05/23/20 0647 05/24/20 0443 05/25/20 0446 05/26/20 0446  NA 135   < > 136   < > 134* 134* 135 134* 136  K 3.8   < > 3.8   < > 3.9 4.2 3.6 3.8 3.8  CL 104   < > 105   < > 100 101 102 102 102  CO2 19*   < > 19*   < > 23 18* 21* 20* 21*  GLUCOSE 99   < > 88   < > 98 89 92 86 86  BUN 64*   < > 70*   < > 49* 62* 73* 87* 99*  CREATININE 7.60*   < > 8.29*   < > 5.99* 6.85* 7.62* 8.03* 8.40*  CALCIUM 8.3*   < > 8.0*   < > 7.9* 8.2*  8.2* 8.4* 8.4*  MG 1.8  --  1.8  --  1.9 2.0  --   --   --   PHOS 2.7   < > 4.2   < > 3.5 4.6 4.5 5.0* 4.9*   < > = values in this interval not displayed.   GFR: Estimated Creatinine Clearance: 10.5 mL/min (A) (by C-G formula based on SCr of 8.4 mg/dL (H)). Liver Function Tests: Recent Labs  Lab 05/22/20 0542 05/23/20 0647 05/24/20 0443 05/25/20 0446 05/26/20 0446  ALBUMIN 2.7* 2.9* 2.6* 2.7* 2.7*   No results for input(s): LIPASE, AMYLASE in the last 168 hours. No results for  input(s): AMMONIA in the last 168 hours. Coagulation Profile: No results for input(s): INR, PROTIME in the last 168 hours. Cardiac Enzymes: No results for input(s): CKTOTAL, CKMB, CKMBINDEX, TROPONINI in the last 168 hours. BNP (last 3 results) No results for input(s): PROBNP in the last 8760 hours. HbA1C: No results for input(s): HGBA1C in the last 72 hours. CBG: No results for input(s): GLUCAP in the last 168 hours. Lipid Profile: No results for input(s): CHOL, HDL, LDLCALC, TRIG, CHOLHDL, LDLDIRECT in the last 72 hours. Thyroid Function Tests: No results for input(s): TSH, T4TOTAL, FREET4, T3FREE, THYROIDAB in the last 72 hours. Anemia Panel: No results for input(s): VITAMINB12, FOLATE, FERRITIN, TIBC, IRON, RETICCTPCT in the last 72 hours. Sepsis Labs: No results for input(s): PROCALCITON, LATICACIDVEN in the last 168 hours.  No results found for this or any previous visit (from the past 240 hour(s)).   Radiology Studies: No results found.  Scheduled Meds: . calcium carbonate  800 mg of elemental calcium Oral TID  . Chlorhexidine Gluconate Cloth  6 each Topical Q0600  . Chlorhexidine Gluconate Cloth  6 each Topical Q0600  . divalproex  125 mg Oral BID  . docusate sodium  100 mg Oral BID  . feeding supplement (NEPRO CARB STEADY)  237 mL Oral BID BM  . FLUoxetine  40 mg Oral Daily  . latanoprost  1 drop Both Eyes QHS  . magnesium oxide  400 mg Oral Daily  . melatonin  5 mg Oral QHS  .  multivitamin  1 tablet Oral QHS  . psyllium  1 packet Oral Daily  . QUEtiapine  200 mg Oral QHS  . tamsulosin  0.8 mg Oral QPC supper   Continuous Infusions: . sodium chloride 100 mL/hr at 05/16/20 1428  . sodium chloride irrigation       LOS: 15 days   Marylu Lund, MD [facility] Pager On Amion  If 7PM-7AM, please contact night-coverage 05/26/2020, 3:14 PM

## 2020-05-26 NOTE — Progress Notes (Signed)
[facility] Progress Note     Assessment/ Plan:   1. AKI from obstructive uropathy,uremia present without significant initial improvement with placement of foley. Did HD on 8/1 for uremia with clinical improvement. second HD treatment on 8/3  LastHD #3 8/9 RIJ temp cath (8/1)  Too soon to declare what his long term dialysis needs will be.   Great UOP which is promising but clearance is not there yet; if there is no improvement then planning on converting the tunneled catheter on Monday followed by  HD + outpt placement for RRT.  Already spoken with the renal navigator and she is working on getting him a seat outpt HD as well in case renal function doesn't return this hospitalization. He would be an AKI; we can convert to ESRD designation at outpt center if no signs of renal recovery.  If obstructive uropathy has been present for a long time then this may be permanent req RRT indefinitely until transplant.  2. Obstructive uropathy presumed from BPH, severe, now with Foley catheter drainingclear now - urologyinvolved - CBI 3. Elevated PSA from #2, probably, maintain foley- uro f/u-  4. Hypokalemia, likely due to gastrointestinal losses; Supplement PRN- High K bath  5. Hypocalcemia from severity of renal failure, improving 6. Hypomagnesemia from gastrointestinal losses, on supplement 7. Hyperphosphatemia secondary to #1- better 8. Metabolic acidosis and alkalosis, from renal failure andgatriclosses+ PO NaHCO3 intake; AG down- resolved 9. Depression 10. Normocytic anemia, TSAT 26%- gave IV iron- feraheme times 2 doses - discovered to have a hgb under 6- Got 2 units PRBC on 8/3- - dosed with ESA as well-- more blood on 8/5.Presumed GU source- hgb better   Subjective:    Feelinggood; no events overnight Denies f/c/n/v/dyspnea; states his UOP is still greatand clear Hasvery good appetite; weak and still voiced concerns today about going home  without incr mobility and strength   Objective:   BP 128/71 (BP Location: Left Arm)   Pulse 88   Temp 97.8 F (36.6 C) (Oral)   Resp 20   Ht 6\' 1"  (1.854 m)   Wt 101 kg   SpO2 99%   BMI 29.38 kg/m   Intake/Output Summary (Last 24 hours) at 05/26/2020 1151 Last data filed at 05/26/2020 0900 Gross per 24 hour  Intake 740 ml  Output 2050 ml  Net -1310 ml   Weight change: 0.4 kg  Physical Exam: General: nad- Right sided vascath in place- Placed 8/1 Heart: RRR, no rub Lungs: clear Abdomen: soft, NABS Extremities: no edema GU: foley drainingclear now Neuro: better  Imaging: No results found.  Labs: BMET Recent Labs  Lab 05/20/20 0358 05/21/20 0500 05/22/20 0542 05/23/20 0647 05/24/20 0443 05/25/20 0446 05/26/20 0446  NA 135 136 134* 134* 135 134* 136  K 3.8 3.8 3.9 4.2 3.6 3.8 3.8  CL 104 105 100 101 102 102 102  CO2 19* 19* 23 18* 21* 20* 21*  GLUCOSE 99 88 98 89 92 86 86  BUN 64* 70* 49* 62* 73* 87* 99*  CREATININE 7.60* 8.29* 5.99* 6.85* 7.62* 8.03* 8.40*  CALCIUM 8.3* 8.0* 7.9* 8.2* 8.2* 8.4* 8.4*  PHOS 2.7 4.2 3.5 4.6 4.5 5.0* 4.9*   CBC Recent Labs  Lab 05/20/20 0358 05/21/20 0500 05/22/20 0542 05/23/20 0647  WBC 12.9* 11.6* 10.7* 12.3*  NEUTROABS 8.0* 7.2 7.8* 8.6*  HGB 9.7* 9.0* 8.8* 9.4*  HCT 30.5* 29.1* 28.5* 29.8*  MCV 90.2 91.8 91.1 91.7  PLT 232 226 191 204  Medications:    . calcium carbonate  800 mg of elemental calcium Oral TID  . Chlorhexidine Gluconate Cloth  6 each Topical Q0600  . Chlorhexidine Gluconate Cloth  6 each Topical Q0600  . divalproex  125 mg Oral BID  . docusate sodium  100 mg Oral BID  . feeding supplement (NEPRO CARB STEADY)  237 mL Oral BID BM  . FLUoxetine  40 mg Oral Daily  . latanoprost  1 drop Both Eyes QHS  . magnesium oxide  400 mg Oral Daily  . melatonin  5 mg Oral QHS  . multivitamin  1 tablet Oral QHS  . psyllium  1 packet Oral Daily  . QUEtiapine  200 mg Oral QHS  . tamsulosin  0.8 mg  Oral QPC supper      Otelia Santee, MD 05/26/2020, 11:51 AM

## 2020-05-27 DIAGNOSIS — N179 Acute kidney failure, unspecified: Secondary | ICD-10-CM | POA: Diagnosis not present

## 2020-05-27 LAB — RENAL FUNCTION PANEL
Albumin: 2.7 g/dL — ABNORMAL LOW (ref 3.5–5.0)
Anion gap: 12 (ref 5–15)
BUN: 110 mg/dL — ABNORMAL HIGH (ref 8–23)
CO2: 20 mmol/L — ABNORMAL LOW (ref 22–32)
Calcium: 8.5 mg/dL — ABNORMAL LOW (ref 8.9–10.3)
Chloride: 103 mmol/L (ref 98–111)
Creatinine, Ser: 8.48 mg/dL — ABNORMAL HIGH (ref 0.61–1.24)
GFR calc Af Amer: 7 mL/min — ABNORMAL LOW (ref >60)
GFR calc non Af Amer: 6 mL/min — ABNORMAL LOW (ref >60)
Glucose, Bld: 88 mg/dL (ref 70–99)
Phosphorus: 5 mg/dL — ABNORMAL HIGH (ref 2.5–4.6)
Potassium: 3.8 mmol/L (ref 3.5–5.1)
Sodium: 135 mmol/L (ref 135–145)

## 2020-05-27 NOTE — Consult Note (Signed)
Chief Complaint: Patient was seen in consultation today for AKI/tunneled HD catheter placement.  Referring Physician(s): Dwana Melena  Supervising Physician: Aletta Edouard  Patient Status: [facility] - In-pt  History of Present Illness: Manuel Ortega is a 69 y.o. male with a past medical history of hypertension, AKI, glaucoma, and depression. He has been admitted to [facility] since 05/11/2020 for management of AKI. He had a nontunneled HD catheter placed in IR 05/12/2020 by Dr. Pascal Lux and has undergone dialysis since. Per nephrology, no signs of renal recovery at this time.  IR requested by Dr. Augustin Coupe for possible image-guided nontunneled to tunneled HD catheter conversion. Patient awake and alert sitting in bed watching TV with no complaints at this time. Denies fever, chills, chest pain, dyspnea, abdominal pain, or headache.   Past Medical History:  Diagnosis Date  . AKI (acute kidney injury) ([place]) 05/2020  . Depression   . Glaucoma   . Hypertension     Past Surgical History:  Procedure Laterality Date  . IR FLUORO GUIDE CV LINE RIGHT  05/12/2020  . IR US GUIDE VASC ACCESS RIGHT  05/12/2020    Allergies: Penicillins  Medications: Prior to Admission medications   Medication Sig Start Date End Date Taking? Authorizing Provider  divalproex (DEPAKOTE) 125 MG DR tablet Take 125 mg by mouth 2 (two) times daily. 12/19/19  Yes [provider]  latanoprost (XALATAN) 0.005 % ophthalmic solution Place 1 drop into both eyes at bedtime.  12/19/19  Yes [provider]  QUEtiapine (SEROQUEL) 200 MG tablet Take 200 mg by mouth at bedtime.  11/14/19  Yes [provider]     Family History  Family history unknown: Yes    Social History   Socioeconomic History  . Marital status: Unknown    Spouse name: Not on file  . Number of children: Not on file  . Years of education: Not on file  . Highest education level: Not on file  Occupational History  . Not on file  Tobacco  Use  . Smoking status: Never Smoker  . Smokeless tobacco: Never Used  Vaping Use  . Vaping Use: Never used  Substance and Sexual Activity  . Alcohol use: Never  . Drug use: Never  . Sexual activity: Not on file  Other Topics Concern  . Not on file  Social History Narrative  . Not on file   Social Determinants of Health   Financial Resource Strain:   . Difficulty of Paying Living Expenses:   Food Insecurity:   . Worried About Charity fundraiser in the Last Year:   . Arboriculturist in the Last Year:   Transportation Needs:   . Film/video editor (Medical):   Marland Kitchen Lack of Transportation (Non-Medical):   Physical Activity:   . Days of Exercise per Week:   . Minutes of Exercise per Session:   Stress:   . Feeling of Stress :   Social Connections:   . Frequency of Communication with Friends and Family:   . Frequency of Social Gatherings with Friends and Family:   . Attends Religious Services:   . Active Member of Clubs or Organizations:   . Attends Archivist Meetings:   Marland Kitchen Marital Status:      Review of Systems: A 12 point ROS discussed and pertinent positives are indicated in the HPI above.  All other systems are negative.  Review of Systems  Constitutional: Negative for chills and fever.  Respiratory: Negative for shortness  of breath and wheezing.   Cardiovascular: Negative for chest pain and palpitations.  Gastrointestinal: Negative for abdominal pain.  Neurological: Negative for headaches.  Psychiatric/Behavioral: Negative for behavioral problems and confusion.    Vital Signs: BP 129/80 (BP Location: Left Arm)   Pulse 90   Temp 98.2 F (36.8 C) (Oral)   Resp 16   Ht '6\' 1"'$  (1.854 m)   Wt 224 lb 13.9 oz (102 kg)   SpO2 97%   BMI 29.67 kg/m   Physical Exam Vitals and nursing note reviewed.  Constitutional:      General: He is not in acute distress.    Appearance: Normal appearance.  Cardiovascular:     Rate and Rhythm: Normal rate and regular  rhythm.     Heart sounds: Normal heart sounds. No murmur heard.   Pulmonary:     Effort: Pulmonary effort is normal. No respiratory distress.     Breath sounds: Normal breath sounds. No wheezing.  Skin:    General: Skin is warm and dry.  Neurological:     Mental Status: He is alert and oriented to person, place, and time.      MD Evaluation Airway: WNL Heart: WNL Abdomen: WNL Chest/ Lungs: WNL ASA  Classification: 3 Mallampati/Airway Score: Two   Imaging: CT ABDOMEN PELVIS WO CONTRAST  Result Date: 05/11/2020 CLINICAL DATA:  Acute renal failure. Nausea and vomiting. Diarrhea for 2 days. Dysuria. EXAM: CT ABDOMEN AND PELVIS WITHOUT CONTRAST TECHNIQUE: Multidetector CT imaging of the abdomen and pelvis was performed following the standard protocol without IV contrast. COMPARISON:  None. FINDINGS: Lower chest: 5 mm pulmonary lymph node in the right lower lobe, series 4, image 2 is likely a perifissural intrapulmonary lymph node but nonspecific. Heart is normal in size. No pleural effusion. Hepatobiliary: No focal liver abnormality is seen. No gallstones, gallbladder wall thickening, or biliary dilatation. Pancreas: No ductal dilatation or inflammation. Single calcification in the uncinate process of the pancreas. Spleen: Normal in size without focal abnormality. Adrenals/Urinary Tract: Bilateral adrenal thickening without dominant nodule. There is bilateral hydroureteronephrosis. Both ureters are dilated to bladder insertion. Bilateral perinephric edema that is symmetric. There is thinning of the bilateral renal parenchyma. Cortical cysts in the left kidney. There is marked urinary bladder distention with bladder spanning 23 cm cranial caudal extending into the right aspect of the abdomen. Bladder wall appears trabeculated inferiorly with probable multiple small diverticula. No definite bladder wall thickening or obvious focal bladder lesion. There is mass effect on the bladder base from  prostate gland. Stomach/Bowel: Fluid levels in the stomach without evidence of gastric wall thickening. No abnormal gastric distension. Short of the small bowel is decompressed without obstruction, inflammation, or abnormal distention. Colonic diverticulosis throughout the entire colon, including the cecum. No diverticulitis. No colonic wall thickening or inflammatory change. Normal appendix. Vascular/Lymphatic: Aortic atherosclerosis. No aortic aneurysm. No bulky abdominopelvic adenopathy. Reproductive: Enlarged prostate gland spanning 5.5 x 5.3 x 5.4 cm (volume = 82 cm^3). Other: Fat in both inguinal canals. Tiny fat containing umbilical hernia. No ascites or free air. Musculoskeletal: Degenerative change throughout the spine. There are no acute or suspicious osseous abnormalities. IMPRESSION: 1. Marked urinary bladder distention with bladder wall trabeculation and probable multiple small diverticula. Bilateral hydroureteronephrosis, likely due to distended bladder. Suspect chronic bladder outlet obstruction. 2. Enlarged prostate gland causing mass effect on the bladder base. 3. Colonic diverticulosis without diverticulitis. 4. A 5 mm pulmonary lymph node in the right lower lobe is likely a perifissural intrapulmonary lymph  node but nonspecific. Consider follow-up chest CT 1 year if patient is high-risk. Aortic Atherosclerosis (ICD10-I70.0). Electronically Signed   By: Keith Rake M.D.   On: 05/11/2020 17:59   US RENAL  Result Date: 05/20/2020 CLINICAL DATA:  Inpatient.  Follow-up bilateral hydronephrosis. EXAM: RENAL / URINARY TRACT ULTRASOUND COMPLETE COMPARISON:  05/12/2019 CT abdomen/pelvis. FINDINGS: Right Kidney: Renal measurements: 10.3 x 6.6 x 6.2 cm = volume: 221 mL. Mildly echogenic right renal parenchyma. Mild hydronephrosis, improved from recent CT. No renal mass. Left Kidney: Renal measurements: 10.6 x 5.3 x 4.7 cm = volume: 137 mL. Mildly echogenic left renal parenchyma. Two simple upper left  renal cysts, largest 3.4 x 3.3 x 2.7 cm in the posterior upper left kidney. Mild hydronephrosis, improved from recent CT. Bladder: Diffusely thick walled bladder collapsed by indwelling Foley catheter and not well evaluated. Other: None. IMPRESSION: 1. Mild bilateral hydronephrosis, improved bilaterally from recent CT. 2. 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 3. Bladder collapsed by indwelling Foley catheter and not well evaluated. Bladder appears diffusely thick walled, nonspecific, probably due to chronic bladder outlet obstruction. Electronically Signed   By: Ilona Sorrel M.D.   On: 05/20/2020 15:44   IR Fluoro Guide CV Line Right  Result Date: 05/12/2020 INDICATION: Obstructive uropathy secondary to BPH now with acute on chronic renal insufficiency, hopes to be improved with Foley catheter insertion and initiation of dialysis. Patient also with elevated white blood cell count. As such, request made for placement of a temporary dialysis catheter for the initiation of dialysis. EXAM: NON-TUNNELED CENTRAL VENOUS HEMODIALYSIS CATHETER PLACEMENT WITH ULTRASOUND AND FLUOROSCOPIC GUIDANCE COMPARISON:  None. Anesthesia/sedation: Moderate (conscious) sedation was employed during this procedure. A total of Fentanyl 100 mcg was administered intravenously. Moderate Sedation Time: 10 minutes. The patient's level of consciousness and vital signs were monitored continuously by radiology nursing throughout the procedure under my direct supervision. MEDICATIONS: None FLUOROSCOPY TIME:  24 seconds (7 mGy) COMPLICATIONS: None immediate. PROCEDURE: Informed written consent was obtained from the patient after a discussion of the risks, benefits, and alternatives to treatment. Questions regarding the procedure were encouraged and answered. The right neck and chest were prepped with chlorhexidine in a sterile fashion, and a sterile drape was applied covering the operative  field. Maximum barrier sterile technique with sterile gowns and gloves were used for the procedure. A timeout was performed prior to the initiation of the procedure. After the overlying soft tissues were anesthetized, a small venotomy incision was created and a micropuncture kit was utilized to access the internal jugular vein. Real-time ultrasound guidance was utilized for vascular access including the acquisition of a permanent ultrasound image documenting patency of the accessed vessel. The microwire was utilized to measure appropriate catheter length. A stiff glidewire was advanced to the level of the IVC. Under fluoroscopic guidance, the venotomy was serially dilated, ultimately allowing placement of a 20 cm temporary Trialysis catheter with tip ultimately terminating within the superior aspect of the right atrium. Final catheter positioning was confirmed and documented with a spot radiographic image. The catheter aspirates and flushes normally. The catheter was flushed with appropriate volume heparin dwells. The catheter exit site was secured with a 0-Prolene retention suture. A dressing was placed. The patient tolerated the procedure well without immediate post procedural complication. IMPRESSION: Successful placement of a right internal jugular approach 20 cm temporary dialysis catheter with tip terminating with in the superior aspect of the right atrium. The catheter is ready for immediate use.  PLAN: This catheter may be converted to a tunneled dialysis catheter at a later date as indicated. Note, given obstructive uropathy seen on preceding noncontrast abdominal CT performed 05/11/2020 would have low threshold for repeat noncontrast abdominal CT in the coming days to ensure adequate urinary diversion following Foley catheter insertion. Otherwise, would give consideration for urologic referral for evaluation for potential bilateral double-J ureteral stent placement and ultimately if not a ureteral stent  candidate, for bilateral percutaneous nephrostomy catheter placement. Electronically Signed   By: Simonne Come M.D.   On: 05/12/2020 17:29   IR US Guide Vasc Access Right  Result Date: 05/12/2020 INDICATION: Obstructive uropathy secondary to BPH now with acute on chronic renal insufficiency, hopes to be improved with Foley catheter insertion and initiation of dialysis. Patient also with elevated white blood cell count. As such, request made for placement of a temporary dialysis catheter for the initiation of dialysis. EXAM: NON-TUNNELED CENTRAL VENOUS HEMODIALYSIS CATHETER PLACEMENT WITH ULTRASOUND AND FLUOROSCOPIC GUIDANCE COMPARISON:  None. Anesthesia/sedation: Moderate (conscious) sedation was employed during this procedure. A total of Fentanyl 100 mcg was administered intravenously. Moderate Sedation Time: 10 minutes. The patient's level of consciousness and vital signs were monitored continuously by radiology nursing throughout the procedure under my direct supervision. MEDICATIONS: None FLUOROSCOPY TIME:  24 seconds (7 mGy) COMPLICATIONS: None immediate. PROCEDURE: Informed written consent was obtained from the patient after a discussion of the risks, benefits, and alternatives to treatment. Questions regarding the procedure were encouraged and answered. The right neck and chest were prepped with chlorhexidine in a sterile fashion, and a sterile drape was applied covering the operative field. Maximum barrier sterile technique with sterile gowns and gloves were used for the procedure. A timeout was performed prior to the initiation of the procedure. After the overlying soft tissues were anesthetized, a small venotomy incision was created and a micropuncture kit was utilized to access the internal jugular vein. Real-time ultrasound guidance was utilized for vascular access including the acquisition of a permanent ultrasound image documenting patency of the accessed vessel. The microwire was utilized to  measure appropriate catheter length. A stiff glidewire was advanced to the level of the IVC. Under fluoroscopic guidance, the venotomy was serially dilated, ultimately allowing placement of a 20 cm temporary Trialysis catheter with tip ultimately terminating within the superior aspect of the right atrium. Final catheter positioning was confirmed and documented with a spot radiographic image. The catheter aspirates and flushes normally. The catheter was flushed with appropriate volume heparin dwells. The catheter exit site was secured with a 0-Prolene retention suture. A dressing was placed. The patient tolerated the procedure well without immediate post procedural complication. IMPRESSION: Successful placement of a right internal jugular approach 20 cm temporary dialysis catheter with tip terminating with in the superior aspect of the right atrium. The catheter is ready for immediate use. PLAN: This catheter may be converted to a tunneled dialysis catheter at a later date as indicated. Note, given obstructive uropathy seen on preceding noncontrast abdominal CT performed 05/11/2020 would have low threshold for repeat noncontrast abdominal CT in the coming days to ensure adequate urinary diversion following Foley catheter insertion. Otherwise, would give consideration for urologic referral for evaluation for potential bilateral double-J ureteral stent placement and ultimately if not a ureteral stent candidate, for bilateral percutaneous nephrostomy catheter placement. Electronically Signed   By: Simonne Come M.D.   On: 05/12/2020 17:29    Labs:  CBC: Recent Labs    05/20/20 0358 05/21/20 0500 05/22/20 0542  05/23/20 0647  WBC 12.9* 11.6* 10.7* 12.3*  HGB 9.7* 9.0* 8.8* 9.4*  HCT 30.5* 29.1* 28.5* 29.8*  PLT 232 226 191 204    COAGS: Recent Labs    05/12/20 0502  INR 1.1  APTT 28    BMP: Recent Labs    05/24/20 0443 05/25/20 0446 05/26/20 0446 05/27/20 0509  NA 135 134* 136 135  K 3.6  3.8 3.8 3.8  CL 102 102 102 103  CO2 21* 20* 21* 20*  GLUCOSE 92 86 86 88  BUN 73* 87* 99* 110*  CALCIUM 8.2* 8.4* 8.4* 8.5*  CREATININE 7.62* 8.03* 8.40* 8.48*  GFRNONAA 7* 6* 6* 6*  GFRAA 8* 7* 7* 7*    LIVER FUNCTION TESTS: Recent Labs    05/11/20 1459 05/12/20 0035 05/12/20 0502 05/12/20 1042 05/13/20 0730 05/13/20 1455 05/24/20 0443 05/25/20 0446 05/26/20 0446 05/27/20 0509  BILITOT 0.6  --  0.6  --  1.0  --   --   --   --   --   AST 12*  --  11*  --  10*  --   --   --   --   --   ALT 9  --  6  --  6  --   --   --   --   --   ALKPHOS 77  --  68  --  62  --   --   --   --   --   PROT 7.6  --  6.1*  --  5.9*  --   --   --   --   --   ALBUMIN 4.3  4.1   < > 3.4*   < > 3.0*   < > 2.6* 2.7* 2.7* 2.7*   < > = values in this interval not displayed.     Assessment and Plan:  AKI currently on HD via nontunneled HD catheter (placed in IR 05/12/2020 by Dr. Pascal Lux), with no signs of renal recovery per nephrology. Plan for image-guided nontunneled to tunneled HD catheter conversion versus new placement in IR tentatively for tomorrow 05/28/2020 pending IR scheduling. Patient will be NPO at midnight. Afebrile.  Risks and benefits discussed with the patient including, but not limited to bleeding, infection, vascular injury, pneumothorax which may require chest tube placement, air embolism or even death. All of the patient's questions were answered, patient is agreeable to proceed. Consent signed and in chart.   Thank you for this interesting consult.  I greatly enjoyed meeting Manuel Ortega and look forward to participating in their care.  A copy of this report was sent to the requesting provider on this date.  Electronically Signed: Earley Abide, PA-C 05/27/2020, 10:50 AM   I spent a total of 20 Minutes in face to face in clinical consultation, greater than 50% of which was counseling/coordinating care for AKI/tunneled HD catheter placement.

## 2020-05-27 NOTE — Progress Notes (Signed)
Patient has refused foley care and CHG bath at 0800, performed peri care. He relayed that he wanted to have it done after lunch. Patient refused when the NT and primary RN rounded on the patient after lunch. Would like at night with night shift. Relayed to oncoming RN and NT that it was not done on current shift.

## 2020-05-27 NOTE — Plan of Care (Signed)

## 2020-05-27 NOTE — Progress Notes (Signed)
PROGRESS NOTE    Manuel Ortega  XVQ:[id number] DOB: Apr 26, 1951 DOA: 05/11/2020 PCP: System, Pcp Not In    Brief Narrative:  69 y.o.malepast medical history significant of depression and hypertension noncompliant with his medication with no primary care doctor for over the last 5 years presented to the [facility] long hospital with nausea vomiting that started 4 days prior to admission. In the ED he was found to have a white count of 13 BUN of 127 creatinine of 18 potassium was 2.6 anion gap of 29 and a bicarb of 23. 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 Nephrology was consulted, as well as IR for tunneled HD catheter  Assessment & Plan:   Principal Problem:   Acute kidney injury ([place]) Active Problems:   Hypokalemia   Hypocalcemia   Hypomagnesemia   Nausea and vomiting  Acute kidney injury due to obstructive uropathy Uremia N/V - Nausea vomiting likely due to uremic symptoms, now resolved. - IR was consulted and HD tunneled catheter placed on 05/12/2020.  Per IR, plan for [facility] placement tomorrow - Nephrology continues to follow.  -Creatinine is trending up, to over 8.48 today.  Patient continues to void well, planning to hold further hemodialysis for now per nephrology.   -Anticipate arranging for outpatient dialysis  Obstructive uropathy/BPH Hematuria Urinary retention -appreciate urology assistance - urology to follow up retention outpt. - s/p 4 units pRBC; urologyhas changed cath to hematuria cath - Currently remains stable with Foley cath -Continue to follow CBC trends  Syncope - episode while having BM this AM; likely vasovagal - check orthostatics: tachy throughout test, but BP ok. Will get small amount of fluids. -orthostatics ok -Remained stable at this time  Hypokalemia Hypomagnesemia Hypocalcemia Hyponatremia Secondary hyperphosphatemia -Lytes stable this AM, potassium  normalized -cont to follow lytes and replace as needed  Anemia of chronic renal disease vomiting: Fe-deficiency Acute blood loss anemia - Fe mildly low, s/p IV Fesupplement - blood loss from hematuria - s/p 4 units pRBC given this admission -Hgb remains stable -Remains hemodynamically stable  Leukocytosis: -mildly elevated; no fever, follow  Elevated lipase - Likely due to nausea and vomiting he has no pancreatitis. - Continues to tolerate diet  Prolonged QTc, resolved -Recent EKG reviewed, QTC noted to be over 590 -Reviewed recent repeat EKG, QTC has normalized  Depression -Patient had noted feeling depressed regarding his worsening renal function and the prospects of needing hemodialysis long-term -EKG reviewed as per above, previously prolonged QTC has now normalized, have started SSRI.  DVT prophylaxis: SCD's Code Status: Full Family Communication: Pt in room, family not at bedside  Status is: Inpatient  Remains inpatient appropriate because:Unsafe d/c plan, IV treatments appropriate due to intensity of illness or inability to take PO and Inpatient level of care appropriate due to severity of illness   Dispo: The patient is from: Home              Anticipated d/c is to: Home              Anticipated d/c date is: > 3 days              Patient currently is not medically stable to d/c.    Consultants:   Nephrology  Procedures:     Antimicrobials: Anti-infectives (From admission, onward)   Start     Dose/Rate Route Frequency Ordered Stop   05/13/20 0800  aztreonam (AZACTAM) 0.5 g in dextrose 5 % 50 mL IVPB  Status:  Discontinued        0.5 g 100 mL/hr over 30 Minutes Intravenous Every 12 hours 05/12/20 1810 05/14/20 1126   05/12/20 1545  aztreonam (AZACTAM) 1 g in sodium chloride 0.9 % 100 mL IVPB        1 g 200 mL/hr over 30 Minutes Intravenous  Once 05/12/20 1533 05/12/20 2237      Subjective: Without chest pain or  sob  Objective: Vitals:   05/26/20 1208 05/26/20 1953 05/27/20 0522 05/27/20 1154  BP: 128/74 134/74 129/80 140/78  Pulse: (!) 102 99 90 94  Resp: 18 18 16 18   Temp: 98.8 F (37.1 C) 98.4 F (36.9 C) 98.2 F (36.8 C) 98.5 F (36.9 C)  TempSrc: Oral Oral Oral Oral  SpO2: 98% 98% 97% 99%  Weight:   102 kg   Height:        Intake/Output Summary (Last 24 hours) at 05/27/2020 1505 Last data filed at 05/27/2020 1030 Gross per 24 hour  Intake 1350 ml  Output 675 ml  Net 675 ml   Filed Weights   05/25/20 0617 05/26/20 0553 05/27/20 0522  Weight: 100.6 kg 101 kg 102 kg    Examination: General exam: Awake, laying in bed, in nad Respiratory system: Normal respiratory effort, no wheezing Cardiovascular system: regular rate, s1, s2 Gastrointestinal system: Soft, nondistended, positive BS Central nervous system: CN2-12 grossly intact, strength intact Extremities: Perfused, no clubbing Skin: Normal skin turgor, no notable skin lesions seen Psychiatry: Mood normal // no visual hallucinations   Data Reviewed: I have personally reviewed following labs and imaging studies  CBC: Recent Labs  Lab 05/21/20 0500 05/22/20 0542 05/23/20 0647  WBC 11.6* 10.7* 12.3*  NEUTROABS 7.2 7.8* 8.6*  HGB 9.0* 8.8* 9.4*  HCT 29.1* 28.5* 29.8*  MCV 91.8 91.1 91.7  PLT 226 191 932   Basic Metabolic Panel: Recent Labs  Lab 05/21/20 0500 05/21/20 0500 05/22/20 0542 05/22/20 0542 05/23/20 0647 05/24/20 0443 05/25/20 0446 05/26/20 0446 05/27/20 0509  NA 136   < > 134*   < > 134* 135 134* 136 135  K 3.8   < > 3.9   < > 4.2 3.6 3.8 3.8 3.8  CL 105   < > 100   < > 101 102 102 102 103  CO2 19*   < > 23   < > 18* 21* 20* 21* 20*  GLUCOSE 88   < > 98   < > 89 92 86 86 88  BUN 70*   < > 49*   < > 62* 73* 87* 99* 110*  CREATININE 8.29*   < > 5.99*   < > 6.85* 7.62* 8.03* 8.40* 8.48*  CALCIUM 8.0*   < > 7.9*   < > 8.2* 8.2* 8.4* 8.4* 8.5*  MG 1.8  --  1.9  --  2.0  --   --   --   --   PHOS 4.2    < > 3.5   < > 4.6 4.5 5.0* 4.9* 5.0*   < > = values in this interval not displayed.   GFR: Estimated Creatinine Clearance: 10.5 mL/min (A) (by C-G formula based on SCr of 8.48 mg/dL (H)). Liver Function Tests: Recent Labs  Lab 05/23/20 0647 05/24/20 0443 05/25/20 0446 05/26/20 0446 05/27/20 0509  ALBUMIN 2.9* 2.6* 2.7* 2.7* 2.7*   No results for input(s): LIPASE, AMYLASE in the last 168 hours. No results for input(s): AMMONIA in the last 168 hours. Coagulation Profile: No results  for input(s): INR, PROTIME in the last 168 hours. Cardiac Enzymes: No results for input(s): CKTOTAL, CKMB, CKMBINDEX, TROPONINI in the last 168 hours. BNP (last 3 results) No results for input(s): PROBNP in the last 8760 hours. HbA1C: No results for input(s): HGBA1C in the last 72 hours. CBG: No results for input(s): GLUCAP in the last 168 hours. Lipid Profile: No results for input(s): CHOL, HDL, LDLCALC, TRIG, CHOLHDL, LDLDIRECT in the last 72 hours. Thyroid Function Tests: No results for input(s): TSH, T4TOTAL, FREET4, T3FREE, THYROIDAB in the last 72 hours. Anemia Panel: No results for input(s): VITAMINB12, FOLATE, FERRITIN, TIBC, IRON, RETICCTPCT in the last 72 hours. Sepsis Labs: No results for input(s): PROCALCITON, LATICACIDVEN in the last 168 hours.  No results found for this or any previous visit (from the past 240 hour(s)).   Radiology Studies: No results found.  Scheduled Meds: . calcium carbonate  800 mg of elemental calcium Oral TID  . Chlorhexidine Gluconate Cloth  6 each Topical Q0600  . Chlorhexidine Gluconate Cloth  6 each Topical Q0600  . divalproex  125 mg Oral BID  . docusate sodium  100 mg Oral BID  . feeding supplement (NEPRO CARB STEADY)  237 mL Oral BID BM  . FLUoxetine  40 mg Oral Daily  . latanoprost  1 drop Both Eyes QHS  . magnesium oxide  400 mg Oral Daily  . melatonin  5 mg Oral QHS  . multivitamin  1 tablet Oral QHS  . psyllium  1 packet Oral Daily  .  QUEtiapine  200 mg Oral QHS  . tamsulosin  0.8 mg Oral QPC supper   Continuous Infusions: . sodium chloride 100 mL/hr at 05/16/20 1428     LOS: 16 days   Marylu Lund, MD [facility] Pager On Amion  If 7PM-7AM, please contact night-coverage 05/27/2020, 3:05 PM

## 2020-05-28 ENCOUNTER — Inpatient Hospital Stay (HOSPITAL_COMMUNITY): Payer: Medicare Other

## 2020-05-28 HISTORY — PX: IR FLUORO GUIDE CV LINE RIGHT: IMG2283

## 2020-05-28 HISTORY — PX: IR US GUIDE VASC ACCESS RIGHT: IMG2390

## 2020-05-28 LAB — COMPREHENSIVE METABOLIC PANEL
ALT: 14 U/L (ref 0–44)
AST: 11 U/L — ABNORMAL LOW (ref 15–41)
Albumin: 2.6 g/dL — ABNORMAL LOW (ref 3.5–5.0)
Alkaline Phosphatase: 57 U/L (ref 38–126)
Anion gap: 12 (ref 5–15)
BUN: 113 mg/dL — ABNORMAL HIGH (ref 8–23)
CO2: 20 mmol/L — ABNORMAL LOW (ref 22–32)
Calcium: 8.6 mg/dL — ABNORMAL LOW (ref 8.9–10.3)
Chloride: 105 mmol/L (ref 98–111)
Creatinine, Ser: 9.02 mg/dL — ABNORMAL HIGH (ref 0.61–1.24)
GFR calc Af Amer: 6 mL/min — ABNORMAL LOW (ref >60)
GFR calc non Af Amer: 5 mL/min — ABNORMAL LOW (ref >60)
Glucose, Bld: 86 mg/dL (ref 70–99)
Potassium: 3.9 mmol/L (ref 3.5–5.1)
Sodium: 137 mmol/L (ref 135–145)
Total Bilirubin: 0.3 mg/dL (ref 0.3–1.2)
Total Protein: 5.7 g/dL — ABNORMAL LOW (ref 6.5–8.1)

## 2020-05-28 LAB — CBC
HCT: 29.8 % — ABNORMAL LOW (ref 39.0–52.0)
Hemoglobin: 9.1 g/dL — ABNORMAL LOW (ref 13.0–17.0)
MCH: 27.9 pg (ref 26.0–34.0)
MCHC: 30.5 g/dL (ref 30.0–36.0)
MCV: 91.4 fL (ref 80.0–100.0)
Platelets: 215 10*3/uL (ref 150–400)
RBC: 3.26 MIL/uL — ABNORMAL LOW (ref 4.22–5.81)
RDW: 16.4 % — ABNORMAL HIGH (ref 11.5–15.5)
WBC: 8.1 10*3/uL (ref 4.0–10.5)
nRBC: 0.2 % (ref 0.0–0.2)

## 2020-05-28 LAB — PHOSPHORUS: Phosphorus: 5.8 mg/dL — ABNORMAL HIGH (ref 2.5–4.6)

## 2020-05-28 MED ORDER — HEPARIN SODIUM (PORCINE) 1000 UNIT/ML IJ SOLN
INTRAMUSCULAR | Status: AC
  Administered 2020-05-28: 2800 [IU]
  Filled 2020-05-28: qty 3

## 2020-05-28 MED ORDER — LIDOCAINE-EPINEPHRINE (PF) 1 %-1:200000 IJ SOLN
INTRAMUSCULAR | Status: AC | PRN
  Administered 2020-05-28: 10 mL

## 2020-05-28 MED ORDER — HEPARIN SODIUM (PORCINE) 1000 UNIT/ML IJ SOLN
INTRAMUSCULAR | Status: AC
  Filled 2020-05-28: qty 1

## 2020-05-28 MED ORDER — MIDAZOLAM HCL 2 MG/2ML IJ SOLN
INTRAMUSCULAR | Status: AC | PRN
  Administered 2020-05-28: 1 mg via INTRAVENOUS

## 2020-05-28 MED ORDER — VANCOMYCIN HCL IN DEXTROSE 1-5 GM/200ML-% IV SOLN
1000.0000 mg | Freq: Once | INTRAVENOUS | Status: AC
  Administered 2020-05-28: 1000 mg via INTRAVENOUS
  Filled 2020-05-28: qty 200

## 2020-05-28 MED ORDER — LIDOCAINE-EPINEPHRINE 1 %-1:100000 IJ SOLN
INTRAMUSCULAR | Status: AC
  Filled 2020-05-28: qty 1

## 2020-05-28 MED ORDER — MIDAZOLAM HCL 2 MG/2ML IJ SOLN
INTRAMUSCULAR | Status: AC
  Filled 2020-05-28: qty 2

## 2020-05-28 MED ORDER — FENTANYL CITRATE (PF) 100 MCG/2ML IJ SOLN
INTRAMUSCULAR | Status: AC | PRN
  Administered 2020-05-28: 25 ug via INTRAVENOUS

## 2020-05-28 MED ORDER — VANCOMYCIN HCL IN DEXTROSE 1-5 GM/200ML-% IV SOLN
INTRAVENOUS | Status: AC
  Filled 2020-05-28: qty 200

## 2020-05-28 MED ORDER — FENTANYL CITRATE (PF) 100 MCG/2ML IJ SOLN
INTRAMUSCULAR | Status: AC
  Filled 2020-05-28: qty 2

## 2020-05-28 MED ORDER — CHLORHEXIDINE GLUCONATE 4 % EX LIQD
CUTANEOUS | Status: AC
  Filled 2020-05-28: qty 15

## 2020-05-28 NOTE — Progress Notes (Signed)
PROGRESS NOTE    Manuel Ortega  ZHG:[id number] DOB: 1951/01/25 DOA: 05/11/2020 PCP: System, Pcp Not In    Brief Narrative:  69 y.o.malepast medical history significant of depression and hypertension noncompliant with his medication with no primary care doctor for over the last 5 years presented to the [facility] long hospital with nausea vomiting that started 4 days prior to admission. In the ED he was found to have a white count of 13 BUN of 127 creatinine of 18 potassium was 2.6 anion gap of 29 and a bicarb of 23. 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 Nephrology was consulted, as well as IR for tunneled HD catheter  Assessment & Plan:   Principal Problem:   Acute kidney injury ([place]) Active Problems:   Hypokalemia   Hypocalcemia   Hypomagnesemia   Nausea and vomiting  Acute kidney injury due to obstructive uropathy Uremia N/V - Nausea vomiting likely due to uremic symptoms, now resolved. - IR was consulted and HD tunneled catheter placed on 05/12/2020.  Per IR, Plan for [facility] placement per IR - Nephrology continues to follow.  -Creatinine is trending up, to over 9 today.  Patient continues to void wel. under -Still hopeful for eventual recovery, planning to arrange outpatient dialysis as pt recovers  Obstructive uropathy/BPH Hematuria Urinary retention -appreciate urology assistance - urology to follow up retention outpt. - s/p 4 units pRBC; urologyhas changed cath to hematuria cath - Currently remains stable with Foley cath -Will continue to follow CBC trends  Syncope - episode while having BM this AM; likely vasovagal - check orthostatics: tachy throughout test, but BP ok. Will get small amount of fluids. -orthostatics noted to be normal -Remained stable at this time  Hypokalemia Hypomagnesemia Hypocalcemia Hyponatremia Secondary hyperphosphatemia -Lytes stable this AM,  potassium normalized -cont to follow lytes and replace as needed  Anemia of chronic renal disease vomiting: Fe-deficiency Acute blood loss anemia - Fe mildly low, s/p IV Fesupplement - blood loss from hematuria - s/p 4 units pRBC given this admission -Hgb remains stable at this time -Remains hemodynamically stable  Leukocytosis: -mildly elevated; no fever, follow  Elevated lipase - Likely due to nausea and vomiting he has no pancreatitis. - Continues to tolerate diet  Prolonged QTc, resolved -Recent EKG reviewed, QTC noted to be over 590 -Reviewed recent repeat EKG, QTC has normalized  Depression -Patient had noted feeling depressed regarding his worsening renal function and the prospects of needing hemodialysis long-term -EKG reviewed as per above, previously prolonged QTC has now normalized, have started SSRI.  DVT prophylaxis: SCD's Code Status: Full Family Communication: Pt in room, family not at bedside  Status is: Inpatient  Remains inpatient appropriate because:Unsafe d/c plan, IV treatments appropriate due to intensity of illness or inability to take PO and Inpatient level of care appropriate due to severity of illness   Dispo: The patient is from: Home              Anticipated d/c is to: Home              Anticipated d/c date is: > 3 days              Patient currently is not medically stable to d/c.    Consultants:   Nephrology  Procedures:     Antimicrobials: Anti-infectives (From admission, onward)   Start     Dose/Rate Route Frequency Ordered Stop   05/28/20 1000  vancomycin (VANCOCIN) IVPB 1000 mg/200 mL premix  Discontinue     1,000 mg 200 mL/hr over 60 Minutes Intravenous  Once 05/28/20 0950     05/13/20 0800  aztreonam (AZACTAM) 0.5 g in dextrose 5 % 50 mL IVPB  Status:  Discontinued        0.5 g 100 mL/hr over 30 Minutes Intravenous Every 12 hours 05/12/20 1810 05/14/20 1126   05/12/20 1545  aztreonam  (AZACTAM) 1 g in sodium chloride 0.9 % 100 mL IVPB        1 g 200 mL/hr over 30 Minutes Intravenous  Once 05/12/20 1533 05/12/20 2237      Subjective: Seen in HD unit. Denies chest pain or sob  Objective: Vitals:   05/28/20 1030 05/28/20 1100 05/28/20 1130 05/28/20 1210  BP: 133/81 (!) 147/83 (!) 150/80 126/83  Pulse: 89 92 93 92  Resp:    17  Temp:    98.6 F (37 C)  TempSrc:    Oral  SpO2:    98%  Weight:      Height:        Intake/Output Summary (Last 24 hours) at 05/28/2020 1319 Last data filed at 05/28/2020 1213 Gross per 24 hour  Intake 605 ml  Output 2747 ml  Net -2142 ml   Filed Weights   05/27/20 0522 05/28/20 0444 05/28/20 0714  Weight: 102 kg 101.2 kg 103.2 kg    Examination: General exam: Conversant, in no acute distress Respiratory system: normal chest rise, clear, no audible wheezing Cardiovascular system: regular rhythm, s1-s2 Gastrointestinal system: Nondistended, nontender, pos BS Central nervous system: No seizures, no tremors Extremities: No cyanosis, no joint deformities Skin: No rashes, no pallor Psychiatry: Affect normal // no auditory hallucinations   Data Reviewed: I have personally reviewed following labs and imaging studies  CBC: Recent Labs  Lab 05/22/20 0542 05/23/20 0647 05/28/20 0801  WBC 10.7* 12.3* 8.1  NEUTROABS 7.8* 8.6*  --   HGB 8.8* 9.4* 9.1*  HCT 28.5* 29.8* 29.8*  MCV 91.1 91.7 91.4  PLT 191 204 761   Basic Metabolic Panel: Recent Labs  Lab 05/22/20 0542 05/22/20 0542 05/23/20 0647 05/23/20 0647 05/24/20 0443 05/25/20 0446 05/26/20 0446 05/27/20 0509 05/28/20 0418  NA 134*   < > 134*   < > 135 134* 136 135 137  K 3.9   < > 4.2   < > 3.6 3.8 3.8 3.8 3.9  CL 100   < > 101   < > 102 102 102 103 105  CO2 23   < > 18*   < > 21* 20* 21* 20* 20*  GLUCOSE 98   < > 89   < > 92 86 86 88 86  BUN 49*   < > 62*   < > 73* 87* 99* 110* 113*  CREATININE 5.99*   < > 6.85*   < > 7.62* 8.03* 8.40* 8.48* 9.02*  CALCIUM  7.9*   < > 8.2*   < > 8.2* 8.4* 8.4* 8.5* 8.6*  MG 1.9  --  2.0  --   --   --   --   --   --   PHOS 3.5   < > 4.6   < > 4.5 5.0* 4.9* 5.0* 5.8*   < > = values in this interval not displayed.   GFR: Estimated Creatinine Clearance: 9.9 mL/min (A) (by C-G formula based on SCr of 9.02 mg/dL (H)). Liver Function Tests: Recent Labs  Lab 05/24/20 9509 05/25/20 0446 05/26/20 0446 05/27/20 0509 05/28/20 0418  AST  --   --   --   --  11*  ALT  --   --   --   --  14  ALKPHOS  --   --   --   --  57  BILITOT  --   --   --   --  0.3  PROT  --   --   --   --  5.7*  ALBUMIN 2.6* 2.7* 2.7* 2.7* 2.6*   No results for input(s): LIPASE, AMYLASE in the last 168 hours. No results for input(s): AMMONIA in the last 168 hours. Coagulation Profile: No results for input(s): INR, PROTIME in the last 168 hours. Cardiac Enzymes: No results for input(s): CKTOTAL, CKMB, CKMBINDEX, TROPONINI in the last 168 hours. BNP (last 3 results) No results for input(s): PROBNP in the last 8760 hours. HbA1C: No results for input(s): HGBA1C in the last 72 hours. CBG: No results for input(s): GLUCAP in the last 168 hours. Lipid Profile: No results for input(s): CHOL, HDL, LDLCALC, TRIG, CHOLHDL, LDLDIRECT in the last 72 hours. Thyroid Function Tests: No results for input(s): TSH, T4TOTAL, FREET4, T3FREE, THYROIDAB in the last 72 hours. Anemia Panel: No results for input(s): VITAMINB12, FOLATE, FERRITIN, TIBC, IRON, RETICCTPCT in the last 72 hours. Sepsis Labs: No results for input(s): PROCALCITON, LATICACIDVEN in the last 168 hours.  No results found for this or any previous visit (from the past 240 hour(s)).   Radiology Studies: No results found.  Scheduled Meds: . calcium carbonate  800 mg of elemental calcium Oral TID  . Chlorhexidine Gluconate Cloth  6 each Topical Q0600  . divalproex  125 mg Oral BID  . docusate sodium  100 mg Oral BID  . feeding supplement (NEPRO CARB STEADY)  237 mL Oral BID BM  .  FLUoxetine  40 mg Oral Daily  . latanoprost  1 drop Both Eyes QHS  . magnesium oxide  400 mg Oral Daily  . melatonin  5 mg Oral QHS  . multivitamin  1 tablet Oral QHS  . psyllium  1 packet Oral Daily  . QUEtiapine  200 mg Oral QHS  . tamsulosin  0.8 mg Oral QPC supper   Continuous Infusions: . sodium chloride 100 mL/hr at 05/16/20 1428  . vancomycin       LOS: 17 days   Marylu Lund, MD [facility] Pager On Amion  If 7PM-7AM, please contact night-coverage 05/28/2020, 1:19 PM

## 2020-05-28 NOTE — Plan of Care (Signed)

## 2020-05-28 NOTE — Progress Notes (Signed)
PT Cancellation Note  Patient Details Name: Manuel Ortega MRN: [id number] DOB: 07/13/1951   Cancelled Treatment:    Reason Eval/Treat Not Completed: Patient at procedure or test/unavailable (Pt in HD.  Will return as able. )   [name] 05/28/2020, 8:17 AM  Bram Hottel W,PT Acute Rehabilitation Services Pager:  [phone]  Office:  [phone]

## 2020-05-28 NOTE — Progress Notes (Signed)
PT Cancellation Note  Patient Details Name: Theon Sobotka MRN: [id number] DOB: [birth year]   Cancelled Treatment:    Reason Eval/Treat Not Completed: Patient at procedure or test/unavailable (Pt still in HD.  )   [name] 05/28/2020, 12:05 PM Valentin Benney W,PT [place] Pager:  [phone]  Office:  [phone]

## 2020-05-28 NOTE — Procedures (Signed)
Interventional Radiology Procedure Note  Procedure: Right IJ Palindrome tunneled HD catheter, 19 cm.  Tip sin the cavoatrial jxn and ready for use.   Complications: None  Estimated Blood Loss: None  Recommendations:  - Routine line care   Signed,  Criselda Peaches, MD

## 2020-05-28 NOTE — Progress Notes (Signed)
[facility] Progress Note     Assessment/ Plan:   1. AKI from obstructive uropathy: Required hemodialysis first on 8/1 for uremia.  Last hemodialysis on 8/9 but due to rising creatinine despite good urine output dialysis again today on 8/16.  Difficult to say when the patient will recover but think he has a fair chance with time and therefore will not undergo graft or fistula placement at this time.  We will set up with outpatient dialysis given his prolonged but expected recovery 2. Obstructive uropathy presumed from BPH, severe, now with Foley catheter drainingclear now - urologyinvolved 3. Elevated PSA from #2, probably, maintain foley- uro f/u-  4. Hypokalemia, resolved with dialysis and supplementation 5. Hypocalcemia: Corrects to normal when accounting for albumin 6. Hyperphosphatemia secondary to #1-stable below 6.  No binders at this time 7. Metabolic acidosis: Minimal with bicarb of 20 secondary to renal failure.  Improved 8. Depression: Symptoms stable 9. Normocytic anemia, TSAT 26%- gave IV iron- feraheme times 2 doses - discovered to have a hgb under 6- Got 2 units PRBC on 8/3- - dosed with ESA as well-- more blood on 8/5.Presumed GU source- hgb stable at this time  Subjective:    Feeling good without specific complaints today.  Denies shortness of breath or chest pain.  Creatinine continues to rise but urine output is excellent with 2.2 L of urine made.   Objective:   BP 126/83 (BP Location: Left Arm)   Pulse 92   Temp 98.6 F (37 C) (Oral)   Resp 17   Ht 6\' 1"  (1.854 m)   Wt 103.2 kg   SpO2 98%   BMI 30.02 kg/m   Intake/Output Summary (Last 24 hours) at 05/28/2020 1215 Last data filed at 05/28/2020 1213 Gross per 24 hour  Intake 820 ml  Output 2747 ml  Net -1927 ml   Weight change: -0.8 kg  Physical Exam: General: nad- Right sided vascath in place- Placed 8/1 Heart: Normal rate, no rub Lungs: clear, no increased work of  breathing Abdomen: soft, NABS Extremities: no edema in the lower extremities, warm and well perfused GU: foley drainingclear now Neuro: Alert, answers questions appropriately  Imaging: No results found.  Labs: BMET Recent Labs  Lab 05/22/20 0542 05/23/20 0647 05/24/20 0443 05/25/20 0446 05/26/20 0446 05/27/20 0509 05/28/20 0418  NA 134* 134* 135 134* 136 135 137  K 3.9 4.2 3.6 3.8 3.8 3.8 3.9  CL 100 101 102 102 102 103 105  CO2 23 18* 21* 20* 21* 20* 20*  GLUCOSE 98 89 92 86 86 88 86  BUN 49* 62* 73* 87* 99* 110* 113*  CREATININE 5.99* 6.85* 7.62* 8.03* 8.40* 8.48* 9.02*  CALCIUM 7.9* 8.2* 8.2* 8.4* 8.4* 8.5* 8.6*  PHOS 3.5 4.6 4.5 5.0* 4.9* 5.0* 5.8*   CBC Recent Labs  Lab 05/22/20 0542 05/23/20 0647 05/28/20 0801  WBC 10.7* 12.3* 8.1  NEUTROABS 7.8* 8.6*  --   HGB 8.8* 9.4* 9.1*  HCT 28.5* 29.8* 29.8*  MCV 91.1 91.7 91.4  PLT 191 204 215    Medications:    . calcium carbonate  800 mg of elemental calcium Oral TID  . Chlorhexidine Gluconate Cloth  6 each Topical Q0600  . divalproex  125 mg Oral BID  . docusate sodium  100 mg Oral BID  . feeding supplement (NEPRO CARB STEADY)  237 mL Oral BID BM  . FLUoxetine  40 mg Oral Daily  . latanoprost  1 drop Both Eyes QHS  .  magnesium oxide  400 mg Oral Daily  . melatonin  5 mg Oral QHS  . multivitamin  1 tablet Oral QHS  . psyllium  1 packet Oral Daily  . QUEtiapine  200 mg Oral QHS  . tamsulosin  0.8 mg Oral QPC supper      Reesa Chew  05/28/2020, 12:15 PM

## 2020-05-29 LAB — RENAL FUNCTION PANEL
Albumin: 2.8 g/dL — ABNORMAL LOW (ref 3.5–5.0)
Anion gap: 11 (ref 5–15)
BUN: 48 mg/dL — ABNORMAL HIGH (ref 8–23)
CO2: 24 mmol/L (ref 22–32)
Calcium: 8.1 mg/dL — ABNORMAL LOW (ref 8.9–10.3)
Chloride: 100 mmol/L (ref 98–111)
Creatinine, Ser: 5.44 mg/dL — ABNORMAL HIGH (ref 0.61–1.24)
GFR calc Af Amer: 12 mL/min — ABNORMAL LOW (ref >60)
GFR calc non Af Amer: 10 mL/min — ABNORMAL LOW (ref >60)
Glucose, Bld: 89 mg/dL (ref 70–99)
Phosphorus: 3.3 mg/dL (ref 2.5–4.6)
Potassium: 4 mmol/L (ref 3.5–5.1)
Sodium: 135 mmol/L (ref 135–145)

## 2020-05-29 NOTE — Progress Notes (Signed)
Renal Navigator sent message to [facility] to request update on status of referral. Navigator will continue to follow closely.  Alphonzo Cruise, [facility] Navigator [phone]

## 2020-05-29 NOTE — Progress Notes (Signed)
Physical Therapy Treatment Patient Details Name: Manuel Ortega MRN: [id number] DOB: January 07, 1951 Today's Date: 05/29/2020    History of Present Illness 69 y.o. male with medical history significant of major depression and hypertension,  not taking any medication,  has no primary care physician, he has no medical care in the last 5 years presenting in the [facility] emergency department with nausea and vomiting. He denies any history of kidney disease. Pt found to have acute kidney injury due to obstructive uropathy and uremia. HD initiated.     PT Comments    Pt reports he is feeling weaker and is unsure how he will be able to make it safely to<>from HD. Pt is limited in safe mobility by 4/4 DoE with short distance ambulation as well as decreased strength and endurance. Pt is currently min A for sit<>stand and hands on min guard for ambulation of 40 feet with Rollator. Pt needs to be cued for sitting on Rollator when his knees start to buckle. PT changing recommendation to SNF level rehab to improve strength and endurance to be able to live alone and safely get himself to and from HD. PT will continue to follow acutely.    Follow Up Recommendations  SNF     Equipment Recommendations  Other (comment);3in1 (PT) (rollator)    Recommendations for Other Services       Precautions / Restrictions Precautions Precautions: Fall Precaution Comments: highly anxious, bladder irrigation Restrictions Weight Bearing Restrictions: No    Mobility  Bed Mobility               General bed mobility comments: sitting EoB on entry   Transfers Overall transfer level: Needs assistance Equipment used: 4-wheeled walker Transfers: Sit to/from Stand Sit to Stand: Min guard;Min assist         General transfer comment: min guard from bed, min A for initial power up from lower Rollator seat, 2 subsequent sit<>stand min guard   Ambulation/Gait Ambulation/Gait assistance: Min guard Gait Distance (Feet): 40  Feet (4x40') Assistive device: 4-wheeled walker Gait Pattern/deviations: Step-through pattern;Decreased stride length Gait velocity: decr Gait velocity interpretation: <1.8 ft/sec, indicate of risk for recurrent falls General Gait Details: hands on min guard for safety, pt with increased fatigue and weakness, knee buckling and 4/4 DOE noted after 40 feet and encouraged to sit, pt felt better after sitting ~90sec, continued with ambulation with similar response to ambulation 3 more times       Balance Overall balance assessment: Needs assistance Sitting-balance support: No upper extremity supported;Feet supported Sitting balance-Leahy Scale: Fair     Standing balance support: No upper extremity supported;During functional activity Standing balance-Leahy Scale: Fair Standing balance comment: needs UE support with dynamic activities                            Cognition Arousal/Alertness: Awake/alert Behavior During Therapy: Anxious;Impulsive   Area of Impairment: Problem solving                             Problem Solving: Requires verbal cues;Requires tactile cues;Slow processing General Comments: anxious about mobility         General Comments General comments (skin integrity, edema, etc.): Mild dizziness with ambulation, HR at rest 103 bpm, max noted HR 131bpm with 4/4 DoE with ambulation, SaO2 on RA 92%O2      Pertinent Vitals/Pain Pain Assessment: No/denies pain  PT Goals (current goals can now be found in the care plan section) Acute Rehab PT Goals Patient Stated Goal: To improve mobility and find more assistance PT Goal Formulation: With patient Time For Goal Achievement: 06/12/20 Potential to Achieve Goals: Good Progress towards PT goals: Not progressing toward goals - comment (pt with decreased safe ambulation distance)    Frequency    Min 2X/week      PT Plan Current plan remains appropriate       AM-PAC PT "6  Clicks" Mobility   Outcome Measure  Help needed turning from your back to your side while in a flat bed without using bedrails?: None Help needed moving from lying on your back to sitting on the side of a flat bed without using bedrails?: A Little Help needed moving to and from a bed to a chair (including a wheelchair)?: A Little Help needed standing up from a chair using your arms (e.g., wheelchair or bedside chair)?: A Little Help needed to walk in hospital room?: A Little Help needed climbing 3-5 steps with a railing? : A Little 6 Click Score: 19    End of Session Equipment Utilized During Treatment: Gait belt Activity Tolerance: Patient limited by fatigue Patient left: with call bell/phone within reach;in chair;with chair alarm set Nurse Communication: Mobility status PT Visit Diagnosis: Unsteadiness on feet (R26.81);Other abnormalities of gait and mobility (R26.89);Muscle weakness (generalized) (M62.81)     Time: 3754-3606 PT Time Calculation (min) (ACUTE ONLY): 25 min  Charges:  $Gait Training: 23-37 mins                     Taysom Glymph B. Migdalia Dk PT, DPT Acute Rehabilitation Services Pager [phone] Office [phone]    [place] 05/29/2020, 5:26 PM

## 2020-05-29 NOTE — Progress Notes (Signed)
[facility] Progress Note     Assessment/ Plan:   1. AKI from obstructive uropathy: Required hemodialysis first on 8/1 for uremia.  Last hemodialysis on 8/9 but due to rising creatinine despite good urine output dialysis again today on 8/16.  Difficult to say when the patient will recover but think he has a fair chance with time and therefore will not undergo graft or fistula placement at this time.  We will set up with outpatient dialysis given his prolonged but expected recovery.  Status post tunneled dialysis catheter 2. Obstructive uropathy presumed from BPH, severe, now with Foley catheter drainingclear now - urologyinvolved 3. Elevated PSA from #2, probably, maintain foley- uro f/u-  4. Hypocalcemia: Corrects to normal when accounting for albumin 5. Hyperphosphatemia secondary to #1-improved to 3.3 today.  No binders 6. Metabolic acidosis: Improved with dialysis.  Continue to monitor 7. Depression: Symptoms stable 8. Normocytic anemia, TSAT 26%- gave IV iron- feraheme times 2 doses - discovered to have a hgb under 6- Got 2 units PRBC on 8/3- - dosed with ESA as well-- more blood on 8/5.Presumed GU source- hgb stable at this time  Subjective:    Patient states he feels well without significant complaints at this time.  No shortness of breath or chest pain.  1.6 L of urine output yesterday.  Tolerated dialysis yesterday without issue.  Status post tunneled dialysis catheter yesterday   Objective:   BP 131/82 (BP Location: Left Arm)   Pulse 91   Temp 97.8 F (36.6 C) (Oral)   Resp 17   Ht _0  (1.854 m)   Wt 99 kg   SpO2 97%   BMI 28.80 kg/m   Intake/Output Summary (Last 24 hours) at 05/29/2020 1143 Last data filed at 05/29/2020 1112 Gross per 24 hour  Intake 2068.33 ml  Output 1865 ml  Net 203.33 ml   Weight change: 2 kg  Physical Exam: General: nad-TDC in place Heart: Normal rate, no rub Lungs: clear, no increased work of  breathing Abdomen: soft, NABS Extremities: no edema in the lower extremities, warm and well perfused GU: foley drainingclear now Neuro: Alert, answers questions appropriately  Imaging: IR Fluoro Guide CV Line Right  Result Date: 05/28/2020 INDICATION: 69 year old male with acute kidney injury requiring hemodialysis. He currently has a temporary right IJ HD catheter in place which was placed on 05/12/2020. he presents for placement of a more durable tunneled hemodialysis catheter. EXAM: TUNNELED CENTRAL VENOUS HEMODIALYSIS CATHETER PLACEMENT WITH ULTRASOUND AND FLUOROSCOPIC GUIDANCE MEDICATIONS: 1 g vancomycin. The antibiotic was given in an appropriate time interval prior to skin puncture. ANESTHESIA/SEDATION: Moderate (conscious) sedation was employed during this procedure. A total of Versed 1 mg and Fentanyl 25 mcg was administered intravenously. Moderate Sedation Time: 20 minutes. The patient's level of consciousness and vital signs were monitored continuously by radiology nursing throughout the procedure under my direct supervision. FLUOROSCOPY TIME:  Fluoroscopy Time: 0 minutes 36 seconds (2 mGy). COMPLICATIONS: None immediate. PROCEDURE: Informed written consent was obtained from the patient after a discussion of the risks, benefits, and alternatives to treatment. Questions regarding the procedure were encouraged and answered. The right neck and chest were prepped with chlorhexidine in a sterile fashion, and a sterile drape was applied covering the operative field. Maximum barrier sterile technique with sterile gowns and gloves were used for the procedure. A timeout was performed prior to the initiation of the procedure. After creating a small venotomy incision, a micropuncture kit was utilized to access the right  internal jugular vein under direct, real-time ultrasound guidance after the overlying soft tissues were anesthetized with 1% lidocaine with epinephrine. Ultrasound image documentation was  performed. The microwire was kinked to measure appropriate catheter length. A stiff Glidewire was advanced to the level of the IVC and the micropuncture sheath was exchanged for a peel-away sheath. A palindrome tunneled hemodialysis catheter measuring 19 cm from tip to cuff was tunneled in a retrograde fashion from the anterior chest wall to the venotomy incision. The catheter was then placed through the peel-away sheath with tips ultimately positioned within the superior aspect of the right atrium. Final catheter positioning was confirmed and documented with a spot radiographic image. The catheter aspirates and flushes normally. The catheter was flushed with appropriate volume heparin dwells. The catheter exit site was secured with a 0-Prolene retention suture. The venotomy incision was closed with Dermabond. Dressings were applied. The patient tolerated the procedure well without immediate post procedural complication. Following successful placement of the new tunneled hemodialysis catheter, the existing non tunneled temporary HD catheter was removed and hemostasis attained by manual pressure. IMPRESSION: Successful placement of 19 cm tip to cuff tunneled hemodialysis catheter via the right internal jugular vein with tips terminating within the superior aspect of the right atrium. The catheter is ready for immediate use. Electronically Signed   By: Jacqulynn Cadet M.D.   On: 05/28/2020 16:18   IR US Guide Vasc Access Right  Result Date: 05/28/2020 INDICATION: 69 year old male with acute kidney injury requiring hemodialysis. He currently has a temporary right IJ HD catheter in place which was placed on 05/12/2020. he presents for placement of a more durable tunneled hemodialysis catheter. EXAM: TUNNELED CENTRAL VENOUS HEMODIALYSIS CATHETER PLACEMENT WITH ULTRASOUND AND FLUOROSCOPIC GUIDANCE MEDICATIONS: 1 g vancomycin. The antibiotic was given in an appropriate time interval prior to skin puncture.  ANESTHESIA/SEDATION: Moderate (conscious) sedation was employed during this procedure. A total of Versed 1 mg and Fentanyl 25 mcg was administered intravenously. Moderate Sedation Time: 20 minutes. The patient's level of consciousness and vital signs were monitored continuously by radiology nursing throughout the procedure under my direct supervision. FLUOROSCOPY TIME:  Fluoroscopy Time: 0 minutes 36 seconds (2 mGy). COMPLICATIONS: None immediate. PROCEDURE: Informed written consent was obtained from the patient after a discussion of the risks, benefits, and alternatives to treatment. Questions regarding the procedure were encouraged and answered. The right neck and chest were prepped with chlorhexidine in a sterile fashion, and a sterile drape was applied covering the operative field. Maximum barrier sterile technique with sterile gowns and gloves were used for the procedure. A timeout was performed prior to the initiation of the procedure. After creating a small venotomy incision, a micropuncture kit was utilized to access the right internal jugular vein under direct, real-time ultrasound guidance after the overlying soft tissues were anesthetized with 1% lidocaine with epinephrine. Ultrasound image documentation was performed. The microwire was kinked to measure appropriate catheter length. A stiff Glidewire was advanced to the level of the IVC and the micropuncture sheath was exchanged for a peel-away sheath. A palindrome tunneled hemodialysis catheter measuring 19 cm from tip to cuff was tunneled in a retrograde fashion from the anterior chest wall to the venotomy incision. The catheter was then placed through the peel-away sheath with tips ultimately positioned within the superior aspect of the right atrium. Final catheter positioning was confirmed and documented with a spot radiographic image. The catheter aspirates and flushes normally. The catheter was flushed with appropriate volume heparin dwells. The  catheter exit site was secured with a 0-Prolene retention suture. The venotomy incision was closed with Dermabond. Dressings were applied. The patient tolerated the procedure well without immediate post procedural complication. Following successful placement of the new tunneled hemodialysis catheter, the existing non tunneled temporary HD catheter was removed and hemostasis attained by manual pressure. IMPRESSION: Successful placement of 19 cm tip to cuff tunneled hemodialysis catheter via the right internal jugular vein with tips terminating within the superior aspect of the right atrium. The catheter is ready for immediate use. Electronically Signed   By: Jacqulynn Cadet M.D.   On: 05/28/2020 16:18    Labs: BMET Recent Labs  Lab 05/23/20 0647 05/24/20 0443 05/25/20 0446 05/26/20 0446 05/27/20 0509 05/28/20 0418 05/29/20 0404  NA 134* 135 134* 136 135 137 135  K 4.2 3.6 3.8 3.8 3.8 3.9 4.0  CL 101 102 102 102 103 105 100  CO2 18* 21* 20* 21* 20* 20* 24  GLUCOSE 89 92 86 86 88 86 89  BUN 62* 73* 87* 99* 110* 113* 48*  CREATININE 6.85* 7.62* 8.03* 8.40* 8.48* 9.02* 5.44*  CALCIUM 8.2* 8.2* 8.4* 8.4* 8.5* 8.6* 8.1*  PHOS 4.6 4.5 5.0* 4.9* 5.0* 5.8* 3.3   CBC Recent Labs  Lab 05/23/20 0647 05/28/20 0801  WBC 12.3* 8.1  NEUTROABS 8.6*  --   HGB 9.4* 9.1*  HCT 29.8* 29.8*  MCV 91.7 91.4  PLT 204 215    Medications:    . calcium carbonate  800 mg of elemental calcium Oral TID  . Chlorhexidine Gluconate Cloth  6 each Topical Q0600  . divalproex  125 mg Oral BID  . docusate sodium  100 mg Oral BID  . feeding supplement (NEPRO CARB STEADY)  237 mL Oral BID BM  . FLUoxetine  40 mg Oral Daily  . latanoprost  1 drop Both Eyes QHS  . magnesium oxide  400 mg Oral Daily  . melatonin  5 mg Oral QHS  . multivitamin  1 tablet Oral QHS  . psyllium  1 packet Oral Daily  . QUEtiapine  200 mg Oral QHS  . tamsulosin  0.8 mg Oral QPC supper      Reesa Chew  05/29/2020, 11:43  AM

## 2020-05-29 NOTE — Progress Notes (Signed)
PROGRESS NOTE    Manuel Ortega  FTZ:[id number] DOB: [birth year] DOA: 05/11/2020 PCP: System, Pcp Not In    Brief Narrative:  69 y.o.malepast medical history significant of depression and hypertension noncompliant with his medication with no primary care doctor for over the last 5 years presented to the [facility] long hospital with nausea vomiting that started 4 days prior to admission. In the ED he was found to have a white count of 13 BUN of 127 creatinine of 18 potassium was 2.6 anion gap of 29 and a bicarb of 23. 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 Nephrology was consulted, as well as IR for tunneled HD catheter  Assessment & Plan:   Principal Problem:   Acute kidney injury (Elim) Active Problems:   Hypokalemia   Hypocalcemia   Hypomagnesemia   Nausea and vomiting  Acute kidney injury due to obstructive uropathy Uremia N/V - Nausea vomiting likely due to uremic symptoms, now resolved. - IR was consulted and HD tunneled catheter placed on 05/12/2020.  Per IR, Plan for [facility] placement per IR - Nephrology continues to follow.  -Creatinine is trending up, to over 9 today.  Patient continues to void well -Still hopeful for eventual recovery, planning to arrange outpatient dialysis as pt recovers. Pending arranging outpt HD  Obstructive uropathy/BPH Hematuria Urinary retention -appreciate urology assistance - urology to follow up retention outpt. - s/p 4 units pRBC; urologyhas changed cath to hematuria cath - Currently remains stable with Foley cath -Will continue to follow CBC trends  Syncope - episode while having BM this AM; likely vasovagal - check orthostatics: tachy throughout test, but BP ok. Will get small amount of fluids. -orthostatics noted to be normal -Pt has remained stable at this time  Hypokalemia Hypomagnesemia Hypocalcemia Hyponatremia Secondary  hyperphosphatemia -Lytes stable this AM, potassium normalized -cont to follow lytes and replace as needed  Anemia of chronic renal disease vomiting: Fe-deficiency Acute blood loss anemia - Fe mildly low, s/p IV Fesupplement - blood loss from hematuria - s/p 4 units pRBC given this admission -Hgb remains stable at this time -Remains hemodynamically stable currently  Leukocytosis: -resolved; no fever, follow  Elevated lipase - Likely due to nausea and vomiting he has no pancreatitis. - Continues to tolerate diet, now resolved  Prolonged QTc, resolved -Recent EKG reviewed, QTC noted to be over 590 -Reviewed recent repeat EKG, QTC has normalized  Depression -Patient had noted feeling depressed regarding his worsening renal function and the prospects of needing hemodialysis long-term -EKG reviewed as per above, previously prolonged QTC has now normalized, have started SSRI.  DVT prophylaxis: SCD's Code Status: Full Family Communication: Pt in room, family not at bedside  Status is: Inpatient  Remains inpatient appropriate because:Unsafe d/c plan, IV treatments appropriate due to intensity of illness or inability to take PO and Inpatient level of care appropriate due to severity of illness   Dispo: The patient is from: Home              Anticipated d/c is to: Home              Anticipated d/c date is: > 3 days              Patient currently is medically stable to d/c. Pending arrangement of outpt HD    Consultants:   Nephrology  Procedures:     Antimicrobials: Anti-infectives (From admission, onward)   Start     Dose/Rate Route Frequency Ordered Stop  05/28/20 1000  vancomycin (VANCOCIN) IVPB 1000 mg/200 mL premix        1,000 mg 200 mL/hr over 60 Minutes Intravenous  Once 05/28/20 0950 05/28/20 1604   05/13/20 0800  aztreonam (AZACTAM) 0.5 g in dextrose 5 % 50 mL IVPB  Status:  Discontinued        0.5 g 100 mL/hr over 30  Minutes Intravenous Every 12 hours 05/12/20 1810 05/14/20 1126   05/12/20 1545  aztreonam (AZACTAM) 1 g in sodium chloride 0.9 % 100 mL IVPB        1 g 200 mL/hr over 30 Minutes Intravenous  Once 05/12/20 1533 05/12/20 2237      Subjective: Without complaints.   Objective: Vitals:   05/28/20 1626 05/28/20 2035 05/29/20 0610 05/29/20 1110  BP: 135/76 121/74 140/80 131/82  Pulse: 90 99 97 91  Resp: _0 Temp: 98.5 F (36.9 C) 98.5 F (36.9 C) 98.2 F (36.8 C) 97.8 F (36.6 C)  TempSrc: Oral Oral Oral Oral  SpO2: 100% 98% 98% 97%  Weight:   99 kg   Height:        Intake/Output Summary (Last 24 hours) at 05/29/2020 1236 Last data filed at 05/29/2020 1112 Gross per 24 hour  Intake 2043.33 ml  Output 1320 ml  Net 723.33 ml   Filed Weights   05/28/20 0714 05/28/20 1146 05/29/20 0610  Weight: 103.2 kg 101.2 kg 99 kg    Examination: General exam: Awake, laying in bed, in nad Respiratory system: Normal respiratory effort, no wheezing Cardiovascular system: regular rate, s1, s2 Gastrointestinal system: Soft, nondistended, positive BS Central nervous system: CN2-12 grossly intact, strength intact Extremities: Perfused, no clubbing Skin: Normal skin turgor, no notable skin lesions seen Psychiatry: Mood normal // no visual hallucinations   Data Reviewed: I have personally reviewed following labs and imaging studies  CBC: Recent Labs  Lab 05/23/20 0647 05/28/20 0801  WBC 12.3* 8.1  NEUTROABS 8.6*  --   HGB 9.4* 9.1*  HCT 29.8* 29.8*  MCV 91.7 91.4  PLT 204 374   Basic Metabolic Panel: Recent Labs  Lab 05/23/20 0647 05/24/20 0443 05/25/20 0446 05/26/20 0446 05/27/20 0509 05/28/20 0418 05/29/20 0404  NA 134*   < > 134* 136 135 137 135  K 4.2   < > 3.8 3.8 3.8 3.9 4.0  CL 101   < > 102 102 103 105 100  CO2 18*   < > 20* 21* 20* 20* 24  GLUCOSE 89   < > 86 86 88 86 89  BUN 62*   < > 87* 99* 110* 113* 48*  CREATININE 6.85*   < > 8.03* 8.40* 8.48* 9.02*  5.44*  CALCIUM 8.2*   < > 8.4* 8.4* 8.5* 8.6* 8.1*  MG 2.0  --   --   --   --   --   --   PHOS 4.6   < > 5.0* 4.9* 5.0* 5.8* 3.3   < > = values in this interval not displayed.   GFR: Estimated Creatinine Clearance: 16.1 mL/min (A) (by C-G formula based on SCr of 5.44 mg/dL (H)). Liver Function Tests: Recent Labs  Lab 05/25/20 0446 05/26/20 0446 05/27/20 0509 05/28/20 0418 05/29/20 0404  AST  --   --   --  11*  --   ALT  --   --   --  14  --   ALKPHOS  --   --   --  57  --  BILITOT  --   --   --  0.3  --   PROT  --   --   --  5.7*  --   ALBUMIN 2.7* 2.7* 2.7* 2.6* 2.8*   No results for input(s): LIPASE, AMYLASE in the last 168 hours. No results for input(s): AMMONIA in the last 168 hours. Coagulation Profile: No results for input(s): INR, PROTIME in the last 168 hours. Cardiac Enzymes: No results for input(s): CKTOTAL, CKMB, CKMBINDEX, TROPONINI in the last 168 hours. BNP (last 3 results) No results for input(s): PROBNP in the last 8760 hours. HbA1C: No results for input(s): HGBA1C in the last 72 hours. CBG: No results for input(s): GLUCAP in the last 168 hours. Lipid Profile: No results for input(s): CHOL, HDL, LDLCALC, TRIG, CHOLHDL, LDLDIRECT in the last 72 hours. Thyroid Function Tests: No results for input(s): TSH, T4TOTAL, FREET4, T3FREE, THYROIDAB in the last 72 hours. Anemia Panel: No results for input(s): VITAMINB12, FOLATE, FERRITIN, TIBC, IRON, RETICCTPCT in the last 72 hours. Sepsis Labs: No results for input(s): PROCALCITON, LATICACIDVEN in the last 168 hours.  No results found for this or any previous visit (from the past 240 hour(s)).   Radiology Studies: IR Fluoro Guide CV Line Right  Result Date: 05/28/2020 INDICATION: 69 year old male with acute kidney injury requiring hemodialysis. He currently has a temporary right IJ HD catheter in place which was placed on 05/12/2020. he presents for placement of a more durable tunneled hemodialysis catheter.  EXAM: TUNNELED CENTRAL VENOUS HEMODIALYSIS CATHETER PLACEMENT WITH ULTRASOUND AND FLUOROSCOPIC GUIDANCE MEDICATIONS: 1 g vancomycin. The antibiotic was given in an appropriate time interval prior to skin puncture. ANESTHESIA/SEDATION: Moderate (conscious) sedation was employed during this procedure. A total of Versed 1 mg and Fentanyl 25 mcg was administered intravenously. Moderate Sedation Time: 20 minutes. The patient's level of consciousness and vital signs were monitored continuously by radiology nursing throughout the procedure under my direct supervision. FLUOROSCOPY TIME:  Fluoroscopy Time: 0 minutes 36 seconds (2 mGy). COMPLICATIONS: None immediate. PROCEDURE: Informed written consent was obtained from the patient after a discussion of the risks, benefits, and alternatives to treatment. Questions regarding the procedure were encouraged and answered. The right neck and chest were prepped with chlorhexidine in a sterile fashion, and a sterile drape was applied covering the operative field. Maximum barrier sterile technique with sterile gowns and gloves were used for the procedure. A timeout was performed prior to the initiation of the procedure. After creating a small venotomy incision, a micropuncture kit was utilized to access the right internal jugular vein under direct, real-time ultrasound guidance after the overlying soft tissues were anesthetized with 1% lidocaine with epinephrine. Ultrasound image documentation was performed. The microwire was kinked to measure appropriate catheter length. A stiff Glidewire was advanced to the level of the IVC and the micropuncture sheath was exchanged for a peel-away sheath. A palindrome tunneled hemodialysis catheter measuring 19 cm from tip to cuff was tunneled in a retrograde fashion from the anterior chest wall to the venotomy incision. The catheter was then placed through the peel-away sheath with tips ultimately positioned within the superior aspect of the right  atrium. Final catheter positioning was confirmed and documented with a spot radiographic image. The catheter aspirates and flushes normally. The catheter was flushed with appropriate volume heparin dwells. The catheter exit site was secured with a 0-Prolene retention suture. The venotomy incision was closed with Dermabond. Dressings were applied. The patient tolerated the procedure well without immediate post procedural complication. Following successful  placement of the new tunneled hemodialysis catheter, the existing non tunneled temporary HD catheter was removed and hemostasis attained by manual pressure. IMPRESSION: Successful placement of 19 cm tip to cuff tunneled hemodialysis catheter via the right internal jugular vein with tips terminating within the superior aspect of the right atrium. The catheter is ready for immediate use. Electronically Signed   By: Jacqulynn Cadet M.D.   On: 05/28/2020 16:18   IR US Guide Vasc Access Right  Result Date: 05/28/2020 INDICATION: 69 year old male with acute kidney injury requiring hemodialysis. He currently has a temporary right IJ HD catheter in place which was placed on 05/12/2020. he presents for placement of a more durable tunneled hemodialysis catheter. EXAM: TUNNELED CENTRAL VENOUS HEMODIALYSIS CATHETER PLACEMENT WITH ULTRASOUND AND FLUOROSCOPIC GUIDANCE MEDICATIONS: 1 g vancomycin. The antibiotic was given in an appropriate time interval prior to skin puncture. ANESTHESIA/SEDATION: Moderate (conscious) sedation was employed during this procedure. A total of Versed 1 mg and Fentanyl 25 mcg was administered intravenously. Moderate Sedation Time: 20 minutes. The patient's level of consciousness and vital signs were monitored continuously by radiology nursing throughout the procedure under my direct supervision. FLUOROSCOPY TIME:  Fluoroscopy Time: 0 minutes 36 seconds (2 mGy). COMPLICATIONS: None immediate. PROCEDURE: Informed written consent was obtained from  the patient after a discussion of the risks, benefits, and alternatives to treatment. Questions regarding the procedure were encouraged and answered. The right neck and chest were prepped with chlorhexidine in a sterile fashion, and a sterile drape was applied covering the operative field. Maximum barrier sterile technique with sterile gowns and gloves were used for the procedure. A timeout was performed prior to the initiation of the procedure. After creating a small venotomy incision, a micropuncture kit was utilized to access the right internal jugular vein under direct, real-time ultrasound guidance after the overlying soft tissues were anesthetized with 1% lidocaine with epinephrine. Ultrasound image documentation was performed. The microwire was kinked to measure appropriate catheter length. A stiff Glidewire was advanced to the level of the IVC and the micropuncture sheath was exchanged for a peel-away sheath. A palindrome tunneled hemodialysis catheter measuring 19 cm from tip to cuff was tunneled in a retrograde fashion from the anterior chest wall to the venotomy incision. The catheter was then placed through the peel-away sheath with tips ultimately positioned within the superior aspect of the right atrium. Final catheter positioning was confirmed and documented with a spot radiographic image. The catheter aspirates and flushes normally. The catheter was flushed with appropriate volume heparin dwells. The catheter exit site was secured with a 0-Prolene retention suture. The venotomy incision was closed with Dermabond. Dressings were applied. The patient tolerated the procedure well without immediate post procedural complication. Following successful placement of the new tunneled hemodialysis catheter, the existing non tunneled temporary HD catheter was removed and hemostasis attained by manual pressure. IMPRESSION: Successful placement of 19 cm tip to cuff tunneled hemodialysis catheter via the right  internal jugular vein with tips terminating within the superior aspect of the right atrium. The catheter is ready for immediate use. Electronically Signed   By: Jacqulynn Cadet M.D.   On: 05/28/2020 16:18    Scheduled Meds: . calcium carbonate  800 mg of elemental calcium Oral TID  . Chlorhexidine Gluconate Cloth  6 each Topical Q0600  . divalproex  125 mg Oral BID  . docusate sodium  100 mg Oral BID  . feeding supplement (NEPRO CARB STEADY)  237 mL Oral BID BM  . FLUoxetine  40 mg Oral Daily  . latanoprost  1 drop Both Eyes QHS  . magnesium oxide  400 mg Oral Daily  . melatonin  5 mg Oral QHS  . multivitamin  1 tablet Oral QHS  . psyllium  1 packet Oral Daily  . QUEtiapine  200 mg Oral QHS  . tamsulosin  0.8 mg Oral QPC supper   Continuous Infusions: . sodium chloride 100 mL/hr at 05/16/20 1428     LOS: 18 days   Marylu Lund, MD [facility] Pager On Amion  If 7PM-7AM, please contact night-coverage 05/29/2020, 12:36 PM

## 2020-05-29 NOTE — Progress Notes (Signed)
Patient has been accepted at [facility] for OP HD treatment for AKI on a TTS schedule with a seat time of 11:45am. He needs to arrive to his appointments at 11:25am.  On his first day at the clinic, patient needs to arrive at 10:45am to complete intake paperwork prior to his first treatment. Per Primary and Nephrologist, patient is otherwise ready for discharge, as HH has already been arranged. Nephrologist notified Navigator that patient may need transportation assistance.  Navigator met with patient to discuss OP HP plans. Patient informed Navigator that he will need transportation and that he is uncomfortable with discharge as he states he cannot walk and was under the impression that he would be getting a smaller "container" for his urine collection. He was quite angry with Renal Navigator initially at suggesting discharge planning, however, after a long discussion about how we can best care for him and address his concerns, patient was very appreciative and reasonable. Navigator sent secure message to MD and RN CM regarding mobility concerns and it was determined that a re-eval by PT would be appropriate. MD to discuss patient's concerns regarding his urine collection tomorrow during Rounds. Navigator has obtained signatures on [facility] [place] application and will submit for pre-certification. Patient is understanding of OP HD schedule and that it will commence when he is ready for OP HD. He was initially resistant to a change from MWF to TTS, but eventually understood the reasoning for this. Patient has questions on foods to eat and Navigator provided him a print out on basic Renal Diet and explained that he will receive more information from HD clinic Dietician.  Patient reports that his cousin has his house key (he rents a town house from his sister-in-law who resides in [place]) and that he has not been able to get in touch with his cousin in the past few days. He states he asked his cousin to keep  his key so that he could check in on his home while he was hospitalized. Cousin lives in [place]. Patient states he has tried to contact his cousin daily. Navigator asks that he keeps trying, and suggests that he may need to consider contacting a Locksmith if his cousin cannot be reached by time he is medically ready for discharge.  Navigator will follow closely for discharge/start date in clinic to assist with smooth transition from hospital to OP HD clinic.  Alphonzo Cruise, [place] Renal Navigator [phone]

## 2020-05-29 NOTE — TOC Progression Note (Addendum)
Transition of Care [facility]) - Progression Note    Patient Details  Name: Manuel Ortega MRN: [id number] Date of Birth: 06/09/1951  [facility] CM/SW Contact  Zenon Mayo, RN Phone Number: 05/29/2020, 3:22 PM  Clinical Narrative:    NCM called doctors making house calls to see if they were able to scheduled patient , left a message for [facility] to return call back.  16:33- per physical therapist recommendation is for SNF, notified MD and CSW.  Physical therapist states patient is agreeable.    Expected Discharge Plan: [place] Barriers to Discharge: Continued Medical Work up  Expected Discharge Plan and Services Expected Discharge Plan: [place] Arranged: PT, OT, Nurse's Aide [facility] Agency: [place] Date [place]: 05/22/20 Time [place]: 1203 Representative spoke with at [place]: [place] ([place]) Interventions    Readmission Risk Interventions No flowsheet data found.

## 2020-05-30 LAB — RENAL FUNCTION PANEL
Albumin: 2.7 g/dL — ABNORMAL LOW (ref 3.5–5.0)
Anion gap: 12 (ref 5–15)
BUN: 64 mg/dL — ABNORMAL HIGH (ref 8–23)
CO2: 22 mmol/L (ref 22–32)
Calcium: 8.2 mg/dL — ABNORMAL LOW (ref 8.9–10.3)
Chloride: 102 mmol/L (ref 98–111)
Creatinine, Ser: 6.52 mg/dL — ABNORMAL HIGH (ref 0.61–1.24)
GFR calc Af Amer: 9 mL/min — ABNORMAL LOW (ref >60)
GFR calc non Af Amer: 8 mL/min — ABNORMAL LOW (ref >60)
Glucose, Bld: 133 mg/dL — ABNORMAL HIGH (ref 70–99)
Phosphorus: 3.5 mg/dL (ref 2.5–4.6)
Potassium: 3.8 mmol/L (ref 3.5–5.1)
Sodium: 136 mmol/L (ref 135–145)

## 2020-05-30 LAB — CBC
HCT: 32.2 % — ABNORMAL LOW (ref 39.0–52.0)
Hemoglobin: 10 g/dL — ABNORMAL LOW (ref 13.0–17.0)
MCH: 28.6 pg (ref 26.0–34.0)
MCHC: 31.1 g/dL (ref 30.0–36.0)
MCV: 92 fL (ref 80.0–100.0)
Platelets: 201 10*3/uL (ref 150–400)
RBC: 3.5 MIL/uL — ABNORMAL LOW (ref 4.22–5.81)
RDW: 16.7 % — ABNORMAL HIGH (ref 11.5–15.5)
WBC: 9.5 10*3/uL (ref 4.0–10.5)
nRBC: 0 % (ref 0.0–0.2)

## 2020-05-30 MED ORDER — PENTAFLUOROPROP-TETRAFLUOROETH EX AERO: 1.0000 "application " | INHALATION_SPRAY | CUTANEOUS | Status: DC | PRN

## 2020-05-30 MED ORDER — HEPARIN SODIUM (PORCINE) 1000 UNIT/ML DIALYSIS: 1000.0000 [IU] | INTRAMUSCULAR | Status: DC | PRN

## 2020-05-30 MED ORDER — LIDOCAINE-PRILOCAINE 2.5-2.5 % EX CREA: 1.0000 "application " | TOPICAL_CREAM | CUTANEOUS | Status: DC | PRN

## 2020-05-30 MED ORDER — SODIUM CHLORIDE 0.9 % IV SOLN: 100.0000 mL | INTRAVENOUS | Status: DC | PRN

## 2020-05-30 MED ORDER — LIDOCAINE HCL (PF) 1 % IJ SOLN: 5.0000 mL | INTRAMUSCULAR | Status: DC | PRN

## 2020-05-30 MED ORDER — ALTEPLASE 2 MG IJ SOLR: 2.0000 mg | Freq: Once | INTRAMUSCULAR | Status: DC | PRN

## 2020-05-30 NOTE — Progress Notes (Signed)
[facility] Progress Note     Assessment/ Plan:   1. AKI from obstructive uropathy: Started dialysis on 8/1 for uremia with intermittent breaks due to good urine output.  However the patient has continued to have issues with poor clearance requiring scheduled dialysis.  Hopeful for the patient to recover with time.  Delaying fistula placement at this time.  Patient is set up for outpatient dialysis when he is ready to go for rehab placement 2. 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 Elevated PSA from #2, probably, maintain foley- uro f/u-  4. Hypocalcemia: Corrects to normal when accounting for albumin 5. Hyperphosphatemia secondary to #1-improved to 3.3 today.  No binders 6. Metabolic acidosis: Resolved with dialysis 7. Depression: Symptoms stable 8. Normocytic anemia, TSAT 26%- gave IV iron- feraheme times 2 doses - discovered to have a hgb under 6- Got 2 units PRBC on 8/3- - dosed with ESA as well-- more blood on 8/5.Continue to monitor hemoglobin dose ESA as needed  Subjective:    Patient feels well at this time.  Nervous about leaving the hospital.  Denies any specific complaints.   Objective:   BP 135/71 (BP Location: Right Arm)   Pulse 95   Temp 98 F (36.7 C) (Oral)   Resp 18   Ht $R'6\' 1"'Ps$  (1.854 m)   Wt 99.6 kg   SpO2 98%   BMI 28.97 kg/m   Intake/Output Summary (Last 24 hours) at 05/30/2020 1158 Last data filed at 05/30/2020 0900 Gross per 24 hour  Intake 720 ml  Output 800 ml  Net -80 ml   Weight change: -3.6 kg  Physical Exam: General: nad-TDC in place Heart: Normal rate Lungs: Bilateral chest rise, no increased work of breathing Abdomen: soft, NABS Extremities: no edema in the lower extremities, warm and well perfused GU: foley drainingclear Neuro: Alert, answers questions appropriately  Imaging: IR Fluoro Guide CV Line  Right  Result Date: 05/28/2020 INDICATION: 69 year old male with acute kidney injury requiring hemodialysis. He currently has a temporary right IJ HD catheter in place which was placed on 05/12/2020. he presents for placement of a more durable tunneled hemodialysis catheter. EXAM: TUNNELED CENTRAL VENOUS HEMODIALYSIS CATHETER PLACEMENT WITH ULTRASOUND AND FLUOROSCOPIC GUIDANCE MEDICATIONS: 1 g vancomycin. The antibiotic was given in an appropriate time interval prior to skin puncture. ANESTHESIA/SEDATION: Moderate (conscious) sedation was employed during this procedure. A total of Versed 1 mg and Fentanyl 25 mcg was administered intravenously. Moderate Sedation Time: 20 minutes. The patient's level of consciousness and vital signs were monitored continuously by radiology nursing throughout the procedure under my direct supervision. FLUOROSCOPY TIME:  Fluoroscopy Time: 0 minutes 36 seconds (2 mGy). COMPLICATIONS: None immediate. PROCEDURE: Informed written consent was obtained from the patient after a discussion of the risks, benefits, and alternatives to treatment. Questions regarding the procedure were encouraged and answered. The right neck and chest were prepped with chlorhexidine in a sterile fashion, and a sterile drape was applied covering the operative field. Maximum barrier sterile technique with sterile gowns and gloves were used for the procedure. A timeout was performed prior to the initiation of the procedure. After creating a small venotomy incision, a micropuncture kit was utilized to access the right internal jugular vein under direct, real-time ultrasound guidance after the overlying soft tissues were anesthetized with 1% lidocaine with epinephrine. Ultrasound image documentation was performed. The microwire was kinked to measure appropriate catheter length. A stiff  Glidewire was advanced to the level of the IVC and the micropuncture sheath was exchanged for a peel-away sheath. A palindrome  tunneled hemodialysis catheter measuring 19 cm from tip to cuff was tunneled in a retrograde fashion from the anterior chest wall to the venotomy incision. The catheter was then placed through the peel-away sheath with tips ultimately positioned within the superior aspect of the right atrium. Final catheter positioning was confirmed and documented with a spot radiographic image. The catheter aspirates and flushes normally. The catheter was flushed with appropriate volume heparin dwells. The catheter exit site was secured with a 0-Prolene retention suture. The venotomy incision was closed with Dermabond. Dressings were applied. The patient tolerated the procedure well without immediate post procedural complication. Following successful placement of the new tunneled hemodialysis catheter, the existing non tunneled temporary HD catheter was removed and hemostasis attained by manual pressure. IMPRESSION: Successful placement of 19 cm tip to cuff tunneled hemodialysis catheter via the right internal jugular vein with tips terminating within the superior aspect of the right atrium. The catheter is ready for immediate use. Electronically Signed   By: Jacqulynn Cadet M.D.   On: 05/28/2020 16:18   IR US Guide Vasc Access Right  Result Date: 05/28/2020 INDICATION: 69 year old male with acute kidney injury requiring hemodialysis. He currently has a temporary right IJ HD catheter in place which was placed on 05/12/2020. he presents for placement of a more durable tunneled hemodialysis catheter. EXAM: TUNNELED CENTRAL VENOUS HEMODIALYSIS CATHETER PLACEMENT WITH ULTRASOUND AND FLUOROSCOPIC GUIDANCE MEDICATIONS: 1 g vancomycin. The antibiotic was given in an appropriate time interval prior to skin puncture. ANESTHESIA/SEDATION: Moderate (conscious) sedation was employed during this procedure. A total of Versed 1 mg and Fentanyl 25 mcg was administered intravenously. Moderate Sedation Time: 20 minutes. The patient's level of  consciousness and vital signs were monitored continuously by radiology nursing throughout the procedure under my direct supervision. FLUOROSCOPY TIME:  Fluoroscopy Time: 0 minutes 36 seconds (2 mGy). COMPLICATIONS: None immediate. PROCEDURE: Informed written consent was obtained from the patient after a discussion of the risks, benefits, and alternatives to treatment. Questions regarding the procedure were encouraged and answered. The right neck and chest were prepped with chlorhexidine in a sterile fashion, and a sterile drape was applied covering the operative field. Maximum barrier sterile technique with sterile gowns and gloves were used for the procedure. A timeout was performed prior to the initiation of the procedure. After creating a small venotomy incision, a micropuncture kit was utilized to access the right internal jugular vein under direct, real-time ultrasound guidance after the overlying soft tissues were anesthetized with 1% lidocaine with epinephrine. Ultrasound image documentation was performed. The microwire was kinked to measure appropriate catheter length. A stiff Glidewire was advanced to the level of the IVC and the micropuncture sheath was exchanged for a peel-away sheath. A palindrome tunneled hemodialysis catheter measuring 19 cm from tip to cuff was tunneled in a retrograde fashion from the anterior chest wall to the venotomy incision. The catheter was then placed through the peel-away sheath with tips ultimately positioned within the superior aspect of the right atrium. Final catheter positioning was confirmed and documented with a spot radiographic image. The catheter aspirates and flushes normally. The catheter was flushed with appropriate volume heparin dwells. The catheter exit site was secured with a 0-Prolene retention suture. The venotomy incision was closed with Dermabond. Dressings were applied. The patient tolerated the procedure well without immediate post procedural  complication. Following successful placement of the  new tunneled hemodialysis catheter, the existing non tunneled temporary HD catheter was removed and hemostasis attained by manual pressure. IMPRESSION: Successful placement of 19 cm tip to cuff tunneled hemodialysis catheter via the right internal jugular vein with tips terminating within the superior aspect of the right atrium. The catheter is ready for immediate use. Electronically Signed   By: Jacqulynn Cadet M.D.   On: 05/28/2020 16:18    Labs: BMET Recent Labs  Lab 05/24/20 0443 05/25/20 0446 05/26/20 0446 05/27/20 0509 05/28/20 0418 05/29/20 0404 05/30/20 0911  NA 135 134* 136 135 137 135 136  K 3.6 3.8 3.8 3.8 3.9 4.0 3.8  CL 102 102 102 103 105 100 102  CO2 21* 20* 21* 20* 20* 24 22  GLUCOSE 92 86 86 88 86 89 133*  BUN 73* 87* 99* 110* 113* 48* 64*  CREATININE 7.62* 8.03* 8.40* 8.48* 9.02* 5.44* 6.52*  CALCIUM 8.2* 8.4* 8.4* 8.5* 8.6* 8.1* 8.2*  PHOS 4.5 5.0* 4.9* 5.0* 5.8* 3.3 3.5   CBC Recent Labs  Lab 05/28/20 0801  WBC 8.1  HGB 9.1*  HCT 29.8*  MCV 91.4  PLT 215    Medications:    . calcium carbonate  800 mg of elemental calcium Oral TID  . Chlorhexidine Gluconate Cloth  6 each Topical Q0600  . divalproex  125 mg Oral BID  . docusate sodium  100 mg Oral BID  . feeding supplement (NEPRO CARB STEADY)  237 mL Oral BID BM  . FLUoxetine  40 mg Oral Daily  . latanoprost  1 drop Both Eyes QHS  . magnesium oxide  400 mg Oral Daily  . melatonin  5 mg Oral QHS  . multivitamin  1 tablet Oral QHS  . psyllium  1 packet Oral Daily  . QUEtiapine  200 mg Oral QHS  . tamsulosin  0.8 mg Oral QPC supper      Reesa Chew  05/30/2020, 11:58 AM

## 2020-05-30 NOTE — NC FL2 (Addendum)
[place] LEVEL OF CARE SCREENING TOOL     IDENTIFICATION  Patient Name: Manuel Ortega Birthdate: [birth year] Sex: male Admission Date (Current Location): 05/11/2020  [facility] and [place] Number:  Herbalist and Address:  [facility] [place], [place] [address] [place],  [address]      Provider Number: [id number]  Attending Physician Name and Address:  Flora Lipps, MD  Relative Name and Phone Number:  Braulio Conte [phone]    Current Level of Care: Hospital Recommended Level of Care: [place] Prior Approval Number:    Date Approved/Denied:   PASRR Number: [id number] (expires 06/29/20)  Discharge Plan: SNF    Current Diagnoses: Patient Active Problem List   Diagnosis Date Noted  . Acute kidney injury (Start) 05/11/2020  . Hypokalemia 05/11/2020  . Hypocalcemia 05/11/2020  . Hypomagnesemia 05/11/2020  . Nausea and vomiting 05/11/2020    Orientation RESPIRATION BLADDER Height & Weight     Self, Time, Situation, Place  Normal Continent, External catheter Weight: 219 lb 9.3 oz (99.6 kg) Height:  6\' 1"  (185.4 cm)  BEHAVIORAL SYMPTOMS/MOOD NEUROLOGICAL BOWEL NUTRITION STATUS      Continent Diet (See discharge summary)  AMBULATORY STATUS COMMUNICATION OF NEEDS Skin     Verbally Normal                       Personal Care Assistance Level of Assistance  Bathing, Feeding, Dressing Bathing Assistance: Limited assistance Feeding assistance: Independent Dressing Assistance: Limited assistance     Functional Limitations Info  Sight, Hearing, Speech Sight Info: Impaired Hearing Info: Adequate Speech Info: Adequate    SPECIAL CARE FACTORS FREQUENCY  PT (By licensed PT), OT (By licensed OT)     PT Frequency: 5x week OT Frequency: 5x week            Contractures Contractures Info: Not present    Additional Factors Info  Code Status, Allergies, Psychotropic Code Status Info:  Full Allergies Info: Penicillins Psychotropic Info: FLUoxetine (PROZAC) capsule 40 mg daily, QUEtiapine (SEROQUEL) tablet 200 mg at bedtime, divalproex (DEPAKOTE) DR tablet 125 mg twice daily         Current Medications (05/30/2020):  This is the current hospital active medication list Current Facility-Administered Medications  Medication Dose Route Frequency Provider Last Rate Last Admin  . acetaminophen (TYLENOL) tablet 650 mg  650 mg Oral Q6H PRN Shawna Clamp, MD       Or  . acetaminophen (TYLENOL) suppository 650 mg  650 mg Rectal Q6H PRN Shawna Clamp, MD      . calcium carbonate (TUMS - dosed in mg elemental calcium) chewable tablet 800 mg of elemental calcium  800 mg of elemental calcium Oral TID Justin Mend, MD   800 mg of elemental calcium at 05/30/20 0920  . Chlorhexidine Gluconate Cloth 2 % PADS 6 each  6 each Topical Q0600 Corliss Parish, MD   6 each at 05/30/20 0517  . divalproex (DEPAKOTE) DR tablet 125 mg  125 mg Oral BID Marylyn Ishihara, Tyrone A, DO   125 mg at 05/30/20 0920  . docusate sodium (COLACE) capsule 100 mg  100 mg Oral BID Shawna Clamp, MD   100 mg at 05/29/20 0925  . feeding supplement (NEPRO CARB STEADY) liquid 237 mL  237 mL Oral BID BM Kyle, Tyrone A, DO   237 mL at 05/30/20 0923  . FLUoxetine (PROZAC) capsule 40 mg  40 mg Oral Daily Marylu Lund  K, MD   40 mg at 05/30/20 0921  . latanoprost (XALATAN) 0.005 % ophthalmic solution 1 drop  1 drop Both Eyes QHS Kyle, Tyrone A, DO   1 drop at 05/29/20 2024  . magnesium oxide (MAG-OX) tablet 400 mg  400 mg Oral Daily Kyle, Tyrone A, DO   400 mg at 05/30/20 0921  . melatonin tablet 5 mg  5 mg Oral QHS Kyle, Tyrone A, DO   5 mg at 05/29/20 2023  . multivitamin (RENA-VIT) tablet 1 tablet  1 tablet Oral QHS Kyle, Tyrone A, DO   1 tablet at 05/29/20 2023  . ondansetron (ZOFRAN) tablet 4 mg  4 mg Oral Q6H PRN Shawna Clamp, MD       Or  . ondansetron [facility]) injection 4 mg  4 mg Intravenous Q6H PRN Shawna Clamp,  MD      . psyllium (HYDROCIL/METAMUCIL) packet 1 packet  1 packet Oral Daily Cherylann Ratel A, DO   1 packet at 05/30/20 [phone]  . QUEtiapine (SEROQUEL) tablet 200 mg  200 mg Oral QHS Kyle, Tyrone A, DO   200 mg at 05/29/20 2023  . tamsulosin (FLOMAX) capsule 0.8 mg  0.8 mg Oral QPC supper Charlynne Cousins, MD   0.8 mg at 05/29/20 1740  . witch hazel-glycerin (TUCKS) pad   Topical PRN Cherylann Ratel A, DO   Given at 05/24/20 1314  . zolpidem (AMBIEN) tablet 5 mg  5 mg Oral QHS PRN Chotiner, Yevonne Aline, MD   5 mg at 05/30/20 0142     Discharge Medications: Please see discharge summary for a list of discharge medications.  Relevant Imaging Results:  Relevant Lab Results:   Additional Information ssn [phone], needs transportation [facility]  T, TH, Sat at 11:25 (first appointment to arrive at 10:45 am).  [place], LCSWA

## 2020-05-30 NOTE — Progress Notes (Signed)
Occupational Therapy Treatment Patient Details Name: Manuel Ortega MRN: [id number] DOB: [birth year] Today's Date: 05/30/2020    History of present illness 69 y.o. male with medical history significant of major depression and hypertension,  not taking any medication,  has no primary care physician, he has no medical care in the last 5 years presenting in the [facility] emergency department with nausea and vomiting. He denies any history of kidney disease. Pt found to have acute kidney injury due to obstructive uropathy and uremia. HD initiated.    OT comments  Pt received sitting up in bed, awake and alert with no complaints of pain and agreeable to session to address UE exercise to improve strength for ADL engagement. Pt educated of there ex with demonstration, receiving HEP. Pt tolerated session well and is motivated to engage. OT will continue to follow to address established deficits and to increase independence prior to dc. Pt reports interest in SNF setting at [place].    Follow Up Recommendations  Home health OT;Supervision - Intermittent    Equipment Recommendations  3 in 1 bedside commode    Recommendations for Other Services      Precautions / Restrictions Precautions Precautions: Fall Precaution Comments: highly anxious, bladder irrigation Restrictions Other Position/Activity Restrictions: Monitor HR       Mobility Bed Mobility Overal bed mobility: Needs Assistance             General bed mobility comments: bed level ther ex due to pt decline because of drowsiness from medications.  Transfers Overall transfer level: Needs assistance               General transfer comment: pt declined OOB ADL tasks    Balance Overall balance assessment: Needs assistance Sitting-balance support: No upper extremity supported;Feet supported Sitting balance-Leahy Scale: Fair Sitting balance - Comments: requires UE support as position of comfort; reports pressure near foley site, lower  abdomen   Standing balance support: No upper extremity supported;During functional activity Standing balance-Leahy Scale: Fair Standing balance comment: needs UE support with dynamic activities                           ADL either performed or assessed with clinical judgement   ADL Overall ADL's : Needs assistance/impaired                                       General ADL Comments: session address UE ther ex with thera band to improve strength for ADL engagement and functional transfers.      Vision   Vision Assessment?: No apparent visual deficits   Perception     Praxis      Cognition Arousal/Alertness: Awake/alert Behavior During Therapy: Anxious;Impulsive Overall Cognitive Status: Within Functional Limits for tasks assessed Area of Impairment: Problem solving                 Orientation Level: Place     Following Commands: Follows one step commands consistently Safety/Judgement: Decreased awareness of deficits Awareness: Emergent Problem Solving: Requires verbal cues;Requires tactile cues;Slow processing General Comments: anxious about mobility        Exercises Exercises: General Upper Extremity General Exercises - Upper Extremity Shoulder Flexion: AROM;Strengthening;Both;10 reps;Theraband Theraband Level (Shoulder Flexion): Level 1 (Yellow) (with 3 sec hold) Shoulder Extension: AROM;Strengthening;Both;10 reps;Theraband Theraband Level (Shoulder Extension): Level 1 (Yellow) (3 sec hold) Shoulder Horizontal ABduction: AROM;Both;Strengthening;10 reps;Theraband  Theraband Level (Shoulder Horizontal Abduction): Level 1 (Yellow) (3 sec hold)   Shoulder Instructions       General Comments drowsiness    Pertinent Vitals/ Pain       Faces Pain Scale: No hurt Pain Location: foley catheter Pain Descriptors / Indicators: Sore  Home Living                                          Prior Functioning/Environment               Frequency  Min 2X/week        Progress Toward Goals  OT Goals(current goals can now be found in the care plan section)  Progress towards OT goals: Progressing toward goals  Acute Rehab OT Goals Patient Stated Goal: To improve mobility and find more assistance OT Goal Formulation: With patient Time For Goal Achievement: 06/08/20 Potential to Achieve Goals: Good ADL Goals Pt Will Perform Grooming: with modified independence;standing Pt Will Perform Lower Body Bathing: with modified independence;with adaptive equipment;sitting/lateral leans;sit to/from stand Pt Will Perform Lower Body Dressing: with modified independence;with adaptive equipment;sitting/lateral leans;sit to/from stand Pt Will Transfer to Toilet: with modified independence;ambulating;bedside commode Pt Will Perform Toileting - Clothing Manipulation and hygiene: with modified independence;sitting/lateral leans;sit to/from stand Pt Will Perform Tub/Shower Transfer: Tub transfer;3 in 1;rolling walker Pt/caregiver will Perform Home Exercise Program: Increased strength;Both right and left upper extremity;With theraband;With written HEP provided  Plan Discharge plan remains appropriate    Co-evaluation                 AM-PAC OT "6 Clicks" Daily Activity     Outcome Measure   Help from another person eating meals?: None Help from another person taking care of personal grooming?: A Little Help from another person toileting, which includes using toliet, bedpan, or urinal?: A Little Help from another person bathing (including washing, rinsing, drying)?: A Lot Help from another person to put on and taking off regular upper body clothing?: None Help from another person to put on and taking off regular lower body clothing?: A Little 6 Click Score: 19    End of Session    OT Visit Diagnosis: Muscle weakness (generalized) (M62.81);Unsteadiness on feet (R26.81)   Activity Tolerance Patient tolerated  treatment well   Patient Left in bed;with call bell/phone within reach;with bed alarm set   Nurse Communication          Time: 5053-9767 OT Time Calculation (min): 21 min  Charges: OT General Charges $OT Visit: 1 Visit OT Treatments $Therapeutic Exercise: 8-22 mins  Minus Breeding, MSOT, OTR/L  Supplemental Rehabilitation Services  [phone]    Marius Ditch 05/30/2020, 4:51 PM

## 2020-05-30 NOTE — Progress Notes (Signed)
RE: Manuel Ortega Date of Birth: 10/24/1950 Date: 05/30/20  Please be advised that the above-named patient will require a short-term nursing home stay - anticipated 30 days or less for rehabilitation and strengthening.  The plan is for return home.

## 2020-05-30 NOTE — TOC Initial Note (Signed)
Transition of Care [facility]) - Initial/Assessment Note    Patient Details  Name: Manuel Ortega MRN: [id number] Date of Birth: Mar 01, 1951  Transition of Care [facility]) CM/SW Contact:    [address], [place] Phone Number: 05/30/2020, 2:41 PM  Clinical Narrative:            CSW received consult for possible SNF placement at time of discharge. CSW spoke with patient regarding PT recommendation of SNF placement at time of discharge. Patient reported that he lives alone and has no family here in the state. Patient expressed understanding of PT recommendation and is agreeable to SNF placement at time of discharge. CSW provided bed offers to patient and patient said that he wanted to send them to family to help him make a decision. Patient stated he would have a decision by tomorrow. CSW discussed insurance authorization process and provided Medicare SNF ratings list. Patient expressed being hopeful for rehab and to feel better soon. Answered patient's question in regards to transportation to SNF as well as dialysis appointments. No further questions reported at this time. CSW to continue to follow and assist with discharge planning needs.        Expected Discharge Plan: Acute to Acute Transfer Barriers to Discharge: Insurance Authorization, Continued Medical Work up   Patient Goals and CMS Choice Patient states their goals for this hospitalization and ongoing recovery are:: Get stronger enough to go home. CMS Medicare.gov Compare Post Acute Care list provided to:: Patient Choice offered to / list presented to : Patient  Expected Discharge Plan and Services Expected Discharge Plan: Acute to Acute Transfer In-house Referral: Clinical Social Work   Post Acute Care Choice: [place] Living arrangements for the past 2 months: [place]: PT, OT, Nurse's Aide [place] Agency: [place] Date [place]: 05/22/20 Time HH  Agency Contacted: 1203 Representative spoke with at [place]: Tommi Rumps  Prior Living Arrangements/Services Living arrangements for the past 2 months: [place] Lives with:: Self Patient language and need for interpreter reviewed:: Yes Do you feel safe going back to the place where you live?: Yes      Need for Family Participation in Patient Care: No (Comment) Care giver support system in place?: No (comment) (Lives alone, family lives in another state)   Criminal Activity/Legal Involvement Pertinent to Current Situation/Hospitalization: No - Comment as needed  Activities of Daily Living Home Assistive Devices/Equipment: Eyeglasses ADL Screening (condition at time of admission) Patient's cognitive ability adequate to safely complete daily activities?: Yes Is the patient deaf or have difficulty hearing?: No Does the patient have difficulty seeing, even when wearing glasses/contacts?: No Does the patient have difficulty concentrating, remembering, or making decisions?: No Patient able to express need for assistance with ADLs?: Yes Does the patient have difficulty dressing or bathing?: No Independently performs ADLs?: Yes (appropriate for developmental age) Does the patient have difficulty walking or climbing stairs?: Yes Weakness of Legs: Both Weakness of Arms/Hands: None  Permission Sought/Granted Permission sought to share information with : Facility Art therapist granted to share information with : Yes, Verbal Permission Granted     Permission granted to share info w AGENCY: SNF        Emotional Assessment Appearance:: Appears stated age Attitude/Demeanor/Rapport: Gracious Affect (typically observed): Pleasant Orientation: : Oriented to Self, Oriented to  Place, Oriented to  Time, Oriented to Situation Alcohol / Substance Use: Not Applicable Psych Involvement: No (comment)  Admission diagnosis:  Hypocalcemia [E83.51] Hypokalemia  [E87.6] Hypomagnesemia [E83.42] Prolonged Q-T interval on ECG [R94.31] AKI (acute kidney injury) ([place]) [N17.9] Acute kidney injury ([place]) [N17.9] Acute renal failure, unspecified acute renal failure type ([place]) [N17.9] Non-intractable vomiting with nausea, unspecified vomiting type [R11.2] Patient Active Problem List   Diagnosis Date Noted   Acute kidney injury ([place]) 05/11/2020   Hypokalemia 05/11/2020   Hypocalcemia 05/11/2020   Hypomagnesemia 05/11/2020   Nausea and vomiting 05/11/2020   PCP:  System, Pcp Not In Pharmacy:   [facility], [id number] [place] [address] Phone: [phone] Fax: [phone]     Social Determinants of Health (SDOH) Interventions    Readmission Risk Interventions Readmission Risk Prevention Plan 05/30/2020  Transportation Screening Complete  PCP or Specialist Appt within 3-5 Days Complete  HRI or [place] Complete  Social Work Consult for [place] Planning/Counseling Complete  Palliative Care Screening Not Applicable  Medication Review Press photographer) Complete

## 2020-05-30 NOTE — Progress Notes (Addendum)
PROGRESS NOTE  Manuel Ortega ZOX:[id number] DOB: 1951-06-19 DOA: 05/11/2020 PCP: System, Pcp Not In   LOS: 19 days   Brief narrative: As per HPI,  69 y.o.malepast medical history significant of depression and hypertension, noncompliant with  medication with no primary care doctor for over the last 5 years, presented to the [facility] long hospital with nausea, vomiting that started 4 days prior to admission. In the ED, he was found to have a white count of 13, BUN of 127, creatinine of 18.0, potassium was 2.6, anion gap of 29 and a bicarb of 23. 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 Nephrology was consulted, as well as IR for tunneled HD catheter.  Patient subsequently underwent hemodialysis.  He was then considered for outpatient hemodialysis.  Currently, awaiting for outpatient hemodialysis center.  Assessment/Plan:  Principal Problem:   Acute kidney injury ([place]) Active Problems:   Hypokalemia   Hypocalcemia   Hypomagnesemia   Nausea and vomiting   Acute kidney injury due to obstructive uropathy resulting in uremia and nausea vomiting. Patient underwent hemodialysis catheter placement and hemodialysis on 05/12/2000. Nephrology on board.  Patient has been having good urinary output.  Hoping for eventual recovery.  Pending outpatient hemodialysis arrangement.  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 Patient received 4 units of packed RBC during hospitalization.  Currently on a Foley catheter.  Has been seen by urology.  Will need outpatient follow-up with urology after discharge.  Will need Foley catheter on discharge.  Patient wishes to have a leg bag on discharge  Vasovagal syncope During bowel movement on 05/29/2020.  No orthostatic hypotension    Hypokalemia/Hypomagnesemia/Hypocalcemia/Hyponatremia/Secondary hyperphosphatemia Closely monitor electrolytes.  Sodium of 135, potassium 4.0 today.  Anemia of chronic renal disease and iron deficiency with acute blood loss anemia. Status post 4 packed RBC.  Latest hemoglobin of 10.0 after transfusion.  Leukocytosis: -resolved; likely reactive.  WBC of 9.5 at this time.  Elevated lipase Likely secondary nausea vomiting.  Prolonged QTc, resolved  Depression Has been started on SSRI since QTC has improved  DVT prophylaxis: SCDs Start: 05/11/20 2231   Code Status: Full code  Family Communication: None  Status is: Inpatient  Remains inpatient appropriate because:Awaiting for outpatient hemodialysis arrangement   Dispo: The patient is from: Home              Anticipated d/c is to: SNF              Anticipated d/c date is: 2 days              Patient currently is medically stable to d/c.  Consultants:  Nephrology  Procedures:  Hemodialysis   Right internal jugular hemodialysis catheter placement 7/30  Urethral catheter  Transfusion of PRBC  Antibiotics:  . None  Anti-infectives (From admission, onward)   Start     Dose/Rate Route Frequency Ordered Stop   05/28/20 1000  vancomycin (VANCOCIN) IVPB 1000 mg/200 mL premix        1,000 mg 200 mL/hr over 60 Minutes Intravenous  Once 05/28/20 0950 05/28/20 1604   05/13/20 0800  aztreonam (AZACTAM) 0.5 g in dextrose 5 % 50 mL IVPB  Status:  Discontinued        0.5 g 100 mL/hr over 30 Minutes Intravenous Every 12 hours 05/12/20  1810 05/14/20 1126   05/12/20 1545  aztreonam (AZACTAM) 1 g in sodium chloride 0.9 % 100 mL IVPB        1 g 200 mL/hr over 30 Minutes Intravenous  Once 05/12/20 1533 05/12/20 2237     Subjective: Today, patient was seen and examined at bedside.  Patient is concerned about having Foley catheter and wishes a leg bag on discharge..  Wishes to go to rehab for  weakness.  Objective: Vitals:   05/29/20 2030 05/30/20 0618  BP: 138/77 128/81  Pulse: 94 99  Resp: 17 19  Temp: 99.1 F (37.3 C) 99.3 F (37.4 C)  SpO2: 99% 100%    Intake/Output Summary (Last 24 hours) at 05/30/2020 0745 Last data filed at 05/30/2020 0149 Gross per 24 hour  Intake 480 ml  Output 1075 ml  Net -595 ml   Filed Weights   05/28/20 1146 05/29/20 0610 05/30/20 0618  Weight: 101.2 kg 99 kg 99.6 kg   Body mass index is 28.97 kg/m.   Physical Exam: GENERAL: Patient is alert awake and oriented. Not in obvious distress. HENT: No scleral pallor or icterus. Pupils equally reactive to light. Oral mucosa is moist NECK: is supple, no gross swelling noted.  Right internal jugular hemodialysis catheter in place CHEST: Clear to auscultation. No crackles or wheezes.  Diminished breath sounds bilaterally. CVS: S1 and S2 heard, no murmur. Regular rate and rhythm.  ABDOMEN: Soft, non-tender, bowel sounds are present.  Foley catheter in place EXTREMITIES: No edema. CNS: Cranial nerves are intact. No focal motor deficits. SKIN: warm and dry without rashes.  Data Review: I have personally reviewed the following laboratory data and studies,  CBC: Recent Labs  Lab 05/28/20 0801  WBC 8.1  HGB 9.1*  HCT 29.8*  MCV 91.4  PLT 626   Basic Metabolic Panel: Recent Labs  Lab 05/25/20 0446 05/26/20 0446 05/27/20 0509 05/28/20 0418 05/29/20 0404  NA 134* 136 135 137 135  K 3.8 3.8 3.8 3.9 4.0  CL 102 102 103 105 100  CO2 20* 21* 20* 20* 24  GLUCOSE 86 86 88 86 89  BUN 87* 99* 110* 113* 48*  CREATININE 8.03* 8.40* 8.48* 9.02* 5.44*  CALCIUM 8.4* 8.4* 8.5* 8.6* 8.1*  PHOS 5.0* 4.9* 5.0* 5.8* 3.3   Liver Function Tests: Recent Labs  Lab 05/25/20 0446 05/26/20 0446 05/27/20 0509 05/28/20 0418 05/29/20 0404  AST  --   --   --  11*  --   ALT  --   --   --  14  --   ALKPHOS  --   --   --  57  --   BILITOT  --   --   --  0.3  --   PROT  --   --   --  5.7*  --    ALBUMIN 2.7* 2.7* 2.7* 2.6* 2.8*   No results for input(s): LIPASE, AMYLASE in the last 168 hours. No results for input(s): AMMONIA in the last 168 hours. Cardiac Enzymes: No results for input(s): CKTOTAL, CKMB, CKMBINDEX, TROPONINI in the last 168 hours. BNP (last 3 results) No results for input(s): BNP in the last 8760 hours.  ProBNP (last 3 results) No results for input(s): PROBNP in the last 8760 hours.  CBG: No results for input(s): GLUCAP in the last 168 hours. No results found for this or any previous visit (from the past 240 hour(s)).   Studies: IR Fluoro Guide CV Line Right  Result Date: 05/28/2020  INDICATION: 69 year old male with acute kidney injury requiring hemodialysis. He currently has a temporary right IJ HD catheter in place which was placed on 05/12/2020. he presents for placement of a more durable tunneled hemodialysis catheter. EXAM: TUNNELED CENTRAL VENOUS HEMODIALYSIS CATHETER PLACEMENT WITH ULTRASOUND AND FLUOROSCOPIC GUIDANCE MEDICATIONS: 1 g vancomycin. The antibiotic was given in an appropriate time interval prior to skin puncture. ANESTHESIA/SEDATION: Moderate (conscious) sedation was employed during this procedure. A total of Versed 1 mg and Fentanyl 25 mcg was administered intravenously. Moderate Sedation Time: 20 minutes. The patient's level of consciousness and vital signs were monitored continuously by radiology nursing throughout the procedure under my direct supervision. FLUOROSCOPY TIME:  Fluoroscopy Time: 0 minutes 36 seconds (2 mGy). COMPLICATIONS: None immediate. PROCEDURE: Informed written consent was obtained from the patient after a discussion of the risks, benefits, and alternatives to treatment. Questions regarding the procedure were encouraged and answered. The right neck and chest were prepped with chlorhexidine in a sterile fashion, and a sterile drape was applied covering the operative field. Maximum barrier sterile technique with sterile gowns and  gloves were used for the procedure. A timeout was performed prior to the initiation of the procedure. After creating a small venotomy incision, a micropuncture kit was utilized to access the right internal jugular vein under direct, real-time ultrasound guidance after the overlying soft tissues were anesthetized with 1% lidocaine with epinephrine. Ultrasound image documentation was performed. The microwire was kinked to measure appropriate catheter length. A stiff Glidewire was advanced to the level of the IVC and the micropuncture sheath was exchanged for a peel-away sheath. A palindrome tunneled hemodialysis catheter measuring 19 cm from tip to cuff was tunneled in a retrograde fashion from the anterior chest wall to the venotomy incision. The catheter was then placed through the peel-away sheath with tips ultimately positioned within the superior aspect of the right atrium. Final catheter positioning was confirmed and documented with a spot radiographic image. The catheter aspirates and flushes normally. The catheter was flushed with appropriate volume heparin dwells. The catheter exit site was secured with a 0-Prolene retention suture. The venotomy incision was closed with Dermabond. Dressings were applied. The patient tolerated the procedure well without immediate post procedural complication. Following successful placement of the new tunneled hemodialysis catheter, the existing non tunneled temporary HD catheter was removed and hemostasis attained by manual pressure. IMPRESSION: Successful placement of 19 cm tip to cuff tunneled hemodialysis catheter via the right internal jugular vein with tips terminating within the superior aspect of the right atrium. The catheter is ready for immediate use. Electronically Signed   By: Jacqulynn Cadet M.D.   On: 05/28/2020 16:18   IR US Guide Vasc Access Right  Result Date: 05/28/2020 INDICATION: 69 year old male with acute kidney injury requiring hemodialysis. He  currently has a temporary right IJ HD catheter in place which was placed on 05/12/2020. he presents for placement of a more durable tunneled hemodialysis catheter. EXAM: TUNNELED CENTRAL VENOUS HEMODIALYSIS CATHETER PLACEMENT WITH ULTRASOUND AND FLUOROSCOPIC GUIDANCE MEDICATIONS: 1 g vancomycin. The antibiotic was given in an appropriate time interval prior to skin puncture. ANESTHESIA/SEDATION: Moderate (conscious) sedation was employed during this procedure. A total of Versed 1 mg and Fentanyl 25 mcg was administered intravenously. Moderate Sedation Time: 20 minutes. The patient's level of consciousness and vital signs were monitored continuously by radiology nursing throughout the procedure under my direct supervision. FLUOROSCOPY TIME:  Fluoroscopy Time: 0 minutes 36 seconds (2 mGy). COMPLICATIONS: None immediate. PROCEDURE: Informed written consent was  obtained from the patient after a discussion of the risks, benefits, and alternatives to treatment. Questions regarding the procedure were encouraged and answered. The right neck and chest were prepped with chlorhexidine in a sterile fashion, and a sterile drape was applied covering the operative field. Maximum barrier sterile technique with sterile gowns and gloves were used for the procedure. A timeout was performed prior to the initiation of the procedure. After creating a small venotomy incision, a micropuncture kit was utilized to access the right internal jugular vein under direct, real-time ultrasound guidance after the overlying soft tissues were anesthetized with 1% lidocaine with epinephrine. Ultrasound image documentation was performed. The microwire was kinked to measure appropriate catheter length. A stiff Glidewire was advanced to the level of the IVC and the micropuncture sheath was exchanged for a peel-away sheath. A palindrome tunneled hemodialysis catheter measuring 19 cm from tip to cuff was tunneled in a retrograde fashion from the anterior  chest wall to the venotomy incision. The catheter was then placed through the peel-away sheath with tips ultimately positioned within the superior aspect of the right atrium. Final catheter positioning was confirmed and documented with a spot radiographic image. The catheter aspirates and flushes normally. The catheter was flushed with appropriate volume heparin dwells. The catheter exit site was secured with a 0-Prolene retention suture. The venotomy incision was closed with Dermabond. Dressings were applied. The patient tolerated the procedure well without immediate post procedural complication. Following successful placement of the new tunneled hemodialysis catheter, the existing non tunneled temporary HD catheter was removed and hemostasis attained by manual pressure. IMPRESSION: Successful placement of 19 cm tip to cuff tunneled hemodialysis catheter via the right internal jugular vein with tips terminating within the superior aspect of the right atrium. The catheter is ready for immediate use. Electronically Signed   By: Jacqulynn Cadet M.D.   On: 05/28/2020 16:18      Manuel Lipps, MD  [facility] 05/30/2020

## 2020-05-31 LAB — CBC
HCT: 32.6 % — ABNORMAL LOW (ref 39.0–52.0)
Hemoglobin: 10.2 g/dL — ABNORMAL LOW (ref 13.0–17.0)
MCH: 28.8 pg (ref 26.0–34.0)
MCHC: 31.3 g/dL (ref 30.0–36.0)
MCV: 92.1 fL (ref 80.0–100.0)
Platelets: 209 10*3/uL (ref 150–400)
RBC: 3.54 MIL/uL — ABNORMAL LOW (ref 4.22–5.81)
RDW: 16.4 % — ABNORMAL HIGH (ref 11.5–15.5)
WBC: 10.4 10*3/uL (ref 4.0–10.5)
nRBC: 0 % (ref 0.0–0.2)

## 2020-05-31 LAB — RENAL FUNCTION PANEL
Albumin: 2.9 g/dL — ABNORMAL LOW (ref 3.5–5.0)
Anion gap: 14 (ref 5–15)
BUN: 75 mg/dL — ABNORMAL HIGH (ref 8–23)
CO2: 21 mmol/L — ABNORMAL LOW (ref 22–32)
Calcium: 8.6 mg/dL — ABNORMAL LOW (ref 8.9–10.3)
Chloride: 100 mmol/L (ref 98–111)
Creatinine, Ser: 7.35 mg/dL — ABNORMAL HIGH (ref 0.61–1.24)
GFR calc Af Amer: 8 mL/min — ABNORMAL LOW (ref >60)
GFR calc non Af Amer: 7 mL/min — ABNORMAL LOW (ref >60)
Glucose, Bld: 90 mg/dL (ref 70–99)
Phosphorus: 4.5 mg/dL (ref 2.5–4.6)
Potassium: 4.1 mmol/L (ref 3.5–5.1)
Sodium: 135 mmol/L (ref 135–145)

## 2020-05-31 LAB — MAGNESIUM: Magnesium: 2.5 mg/dL — ABNORMAL HIGH (ref 1.7–2.4)

## 2020-05-31 LAB — SARS CORONAVIRUS 2 (TAT 6-24 HRS): SARS Coronavirus 2: NEGATIVE

## 2020-05-31 NOTE — Progress Notes (Signed)
[facility] Progress Note     Assessment/ Plan:   1. AKI from obstructive uropathy: Started dialysis on 8/1 for uremia with intermittent breaks due to good urine output.  However the patient has continued to have issues with poor clearance requiring scheduled dialysis.  Hopeful for the patient to recover with time.  Delaying fistula placement at this time.  Patient is set up for outpatient dialysis once outpatient rehab has been arranged.  Dialysis TTS schedule.  Will have dialysis today. 2. 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 Elevated PSA from #2, probably, maintain foley- uro f/u-  4. Depression: Symptoms stable 5. Normocytic anemia, TSAT 26%- gave IV iron- feraheme times 2 doses - discovered to have a hgb under 6- Got 2 units PRBC on 8/3- - dosed with ESA as well-- more blood on 8/5.Continue to monitor hemoglobin dose ESA as needed  Subjective:    Patient feels well without significant complaints today.  Some decreased appetite related to worry because he may have lost his keys.  Otherwise doing okay   Objective:   BP (!) 141/82 (BP Location: Left Arm)   Pulse (!) 101   Temp 98 F (36.7 C) (Oral)   Resp 19   Ht 6\' 1"  (1.854 m)   Wt 99.6 kg   SpO2 96%   BMI 28.97 kg/m   Intake/Output Summary (Last 24 hours) at 05/31/2020 1002 Last data filed at 05/31/2020 0816 Gross per 24 hour  Intake 840 ml  Output 1500 ml  Net -660 ml   Weight change:   Physical Exam: General: nad-TDC in place Heart: Normal rate Lungs: Bilateral chest rise, no increased work of breathing Abdomen: soft, NABS Extremities: no edema in the lower extremities, warm and well perfused GU: foley drainingclear Neuro: Alert, answers questions appropriately  Imaging: No results found.  Labs: BMET Recent Labs  Lab 05/25/20 0446 05/26/20 0446 05/27/20 0509  05/28/20 0418 05/29/20 0404 05/30/20 0911 05/31/20 0430  NA 134* 136 135 137 135 136 135  K 3.8 3.8 3.8 3.9 4.0 3.8 4.1  CL 102 102 103 105 100 102 100  CO2 20* 21* 20* 20* 24 22 21*  GLUCOSE 86 86 88 86 89 133* 90  BUN 87* 99* 110* 113* 48* 64* 75*  CREATININE 8.03* 8.40* 8.48* 9.02* 5.44* 6.52* 7.35*  CALCIUM 8.4* 8.4* 8.5* 8.6* 8.1* 8.2* 8.6*  PHOS 5.0* 4.9* 5.0* 5.8* 3.3 3.5 4.5   CBC Recent Labs  Lab 05/28/20 0801 05/30/20 1219 05/31/20 0430  WBC 8.1 9.5 10.4  HGB 9.1* 10.0* 10.2*  HCT 29.8* 32.2* 32.6*  MCV 91.4 92.0 92.1  PLT 215 201 209    Medications:    . calcium carbonate  800 mg of elemental calcium Oral TID  . Chlorhexidine Gluconate Cloth  6 each Topical Q0600  . divalproex  125 mg Oral BID  . docusate sodium  100 mg Oral BID  . feeding supplement (NEPRO CARB STEADY)  237 mL Oral BID BM  . FLUoxetine  40 mg Oral Daily  . latanoprost  1 drop Both Eyes QHS  . magnesium oxide  400 mg Oral Daily  . melatonin  5 mg Oral QHS  . multivitamin  1 tablet Oral QHS  . psyllium  1 packet Oral Daily  . QUEtiapine  200 mg Oral QHS  . tamsulosin  0.8 mg Oral QPC supper      Shaune Pollack  Tonie Vizcarrondo  05/31/2020, 10:02 AM

## 2020-05-31 NOTE — Progress Notes (Signed)
Nutrition Follow-up  RD working remotely.  DOCUMENTATION CODES:   Not applicable  INTERVENTION:   -Continue Nepro Shake po BID, each supplement provides 425 kcal and 19 grams protein -Continue renal MVI daily  NUTRITION DIAGNOSIS:   Increased nutrient needs related to acute illness (AKI) as evidenced by estimated needs.  Ongoing  GOAL:   Patient will meet greater than or equal to 90% of their needs  Progressing   MONITOR:   PO intake, Supplement acceptance, Labs, Weight trends, Skin, I & O's  REASON FOR ASSESSMENT:   Malnutrition Screening Tool    ASSESSMENT:   Manuel Ortega is a 69 y.o. male with medical history significant of major depression and hypertension,  not taking any medication,  has no primary care physician, he has no medical care in the last 5 years presenting in the [facility] emergency department with nausea and vomiting for past 4 days  8/16- s/p Right IJ Palindrome tunneled HD catheter placement  Reviewed I/O's: -130 ml x 24 hours and -8.6 L since 05/17/20  UOP: 850 ml x 24 hours  Attempted to speak with pt via call to hospital room phone, however, no answer.   Per nephrology notes, still hopeful for renal recovery (holding off on fistula placement at this time). Pt will require outpatient HD at discharge.  Pt remains with good appetite; noted meal completion 85-100%. Pt continues to consume Nepro supplements.    Wt has been stable over the past week.   Per TOC team notes, pt amenable to SNF placement. Awaiting placement and HD arrangements.   Labs reviewed.   Diet Order:   Diet Order            Diet renal with fluid restriction Fluid restriction: 1200 mL Fluid; Room service appropriate? Yes; Fluid consistency: Thin  Diet effective now                 EDUCATION NEEDS:   No education needs have been identified at this time  Skin:  Skin Assessment: Reviewed RN Assessment  Last BM:  05/29/20  Height:   Ht Readings from Last 1  Encounters:  05/12/20 6\' 1"  (1.854 m)    Weight:   Wt Readings from Last 1 Encounters:  05/30/20 99.6 kg    Ideal Body Weight:  83.6 kg  BMI:  Body mass index is 28.97 kg/m.  Estimated Nutritional Needs:   Kcal:  1884-1660  Protein:  110-125 grams  Fluid:  1000 ml + UOP    Loistine Chance, RD, LDN, [place] Registered Dietitian II Certified Diabetes Care and Education Specialist Please refer to [facility] for RD and/or RD on-call/weekend/after hours pager

## 2020-05-31 NOTE — Social Work (Addendum)
SW spoke with pt re current SNF offers and choice. Pt states his family is assisting him in decision and while he will likely accept offer from [facility], he will not know for certain until tomorrow morning as he is awaiting return call from family this evening.   Per nephrology SW note, HD has been arranged at  [facility] TTS at 11:25am. Will f/u with pt tomorrow morning.   Wandra Feinstein, MSW, LCSW [phone] (coverage)

## 2020-05-31 NOTE — Progress Notes (Signed)
Patient refused foley care. RN educated patient.

## 2020-05-31 NOTE — Progress Notes (Signed)
PROGRESS NOTE  Manuel Ortega OHY:[id number] DOB: Jan 05, 1951 DOA: 05/11/2020 PCP: System, Pcp Not In   LOS: 20 days   Brief narrative: As per HPI,  69 y.o.malepast medical history significant of depression and hypertension, noncompliant with  medication with no primary care doctor for over the last 5 years, presented to the [facility] long hospital with nausea, vomiting that started 4 days prior to admission. In the ED, he was found to have a white count of 13, BUN of 127, creatinine of 18.0, potassium was 2.6, anion gap of 29 and a bicarb of 23. 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 Nephrology was consulted, as well as IR for tunneled HD catheter.  Patient subsequently underwent hemodialysis.  He was then considered for outpatient hemodialysis.  Currently, awaiting for outpatient hemodialysis center.  Assessment/Plan:  Principal Problem:   Acute kidney injury ([place]) Active Problems:   Hypokalemia   Hypocalcemia   Hypomagnesemia   Nausea and vomiting  Acute kidney injury due to obstructive uropathy resulting in uremia and nausea vomiting. Patient underwent hemodialysis catheter placement and hemodialysis on 05/12/2000. Nephrology on board.  Patient has been having good urinary output.  Hoping for eventual recovery.  Pending outpatient hemodialysis arrangement.  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 Patient received 4 units of packed RBC during hospitalization.  Currently on a Foley catheter.  Has been seen by urology.  Will need outpatient follow-up with urology after discharge.  Will need Foley catheter on discharge.  Patient wishes to have a leg bag on discharge  Vasovagal syncope During bowel movement on 05/29/2020.  No orthostatic hypotension    Hypokalemia/Hypomagnesemia/Hypocalcemia/Hyponatremia/Secondary hyperphosphatemia Closely monitor electrolytes.  Sodium of 135, potassium 4.1 today.  Anemia of chronic renal disease and iron deficiency with acute blood loss anemia. Status post 4 packed RBC.  Latest hemoglobin of 10.2 after transfusion.  Leukocytosis: -resolved; likely reactive.    Elevated lipase Likely secondary nausea vomiting.  Prolonged QTc, resolved  Depression Has been started on SSRI since QTC has improved  DVT prophylaxis: SCDs Start: 05/11/20 2231   Code Status: Full code  Family Communication: None  Status is: Inpatient  Remains inpatient appropriate because:Awaiting for outpatient hemodialysis arrangement with SNF   Dispo: The patient is from: Home              Anticipated d/c is to: SNF              Anticipated d/c date is: 2 days              Patient currently is medically stable to d/c.  Consultants:  Nephrology  Procedures:  Hemodialysis   Right internal jugular hemodialysis catheter placement 7/30  Urethral catheter  Transfusion of PRBC  Antibiotics:   None  Anti-infectives (From admission, onward)   Start     Dose/Rate Route Frequency Ordered Stop   05/28/20 1000  vancomycin (VANCOCIN) IVPB 1000 mg/200 mL premix        1,000 mg 200 mL/hr over 60 Minutes Intravenous  Once 05/28/20 0950 05/28/20 1604   05/13/20 0800  aztreonam (AZACTAM) 0.5 g in dextrose 5 % 50 mL IVPB  Status:  Discontinued        0.5 g 100 mL/hr over 30 Minutes Intravenous Every 12 hours 05/12/20 1810 05/14/20 1126  05/12/20 1545  aztreonam (AZACTAM) 1 g in sodium chloride 0.9 % 100 mL IVPB        1 g 200 mL/hr over 30 Minutes Intravenous  Once 05/12/20 1533 05/12/20 2237     Subjective: Today, patient was seen and examined at bedside.  Patient denies any chest pain, shortness of breath, fever or chills.  Objective: Vitals:   05/30/20 2027 05/31/20 0620  BP: (!) 146/89 (!)  141/82  Pulse: 93 (!) 101  Resp: 20 19  Temp: 98.5 F (36.9 C) 98 F (36.7 C)  SpO2: 98% 96%    Intake/Output Summary (Last 24 hours) at 05/31/2020 1029 Last data filed at 05/31/2020 0816 Gross per 24 hour  Intake 840 ml  Output 1500 ml  Net -660 ml   Filed Weights   05/28/20 1146 05/29/20 0610 05/30/20 0618  Weight: 101.2 kg 99 kg 99.6 kg   Body mass index is 28.97 kg/m.   Physical Exam: GENERAL: Patient is alert awake and oriented. Not in obvious distress. HENT: No scleral pallor or icterus. Pupils equally reactive to light. Oral mucosa is moist NECK: is supple, no gross swelling noted.  Right internal jugular hemodialysis catheter in place CHEST: Clear to auscultation. No crackles or wheezes.  Diminished breath sounds bilaterally. CVS: S1 and S2 heard, no murmur. Regular rate and rhythm.  ABDOMEN: Soft, non-tender, bowel sounds are present.  Foley catheter in place EXTREMITIES: No edema. CNS: Cranial nerves are intact. No focal motor deficits. SKIN: warm and dry without rashes.  Data Review: I have personally reviewed the following laboratory data and studies,  CBC: Recent Labs  Lab 05/28/20 0801 05/30/20 1219 05/31/20 0430  WBC 8.1 9.5 10.4  HGB 9.1* 10.0* 10.2*  HCT 29.8* 32.2* 32.6*  MCV 91.4 92.0 92.1  PLT 215 201 694   Basic Metabolic Panel: Recent Labs  Lab 05/27/20 0509 05/28/20 0418 05/29/20 0404 05/30/20 0911 05/31/20 0430  NA 135 137 135 136 135  K 3.8 3.9 4.0 3.8 4.1  CL 103 105 100 102 100  CO2 20* 20* 24 22 21*  GLUCOSE 88 86 89 133* 90  BUN 110* 113* 48* 64* 75*  CREATININE 8.48* 9.02* 5.44* 6.52* 7.35*  CALCIUM 8.5* 8.6* 8.1* 8.2* 8.6*  MG  --   --   --   --  2.5*  PHOS 5.0* 5.8* 3.3 3.5 4.5   Liver Function Tests: Recent Labs  Lab 05/27/20 0509 05/28/20 0418 05/29/20 0404 05/30/20 0911 05/31/20 0430  AST  --  11*  --   --   --   ALT  --  14  --   --   --   ALKPHOS  --  57  --   --   --   BILITOT  --  0.3  --   --   --    PROT  --  5.7*  --   --   --   ALBUMIN 2.7* 2.6* 2.8* 2.7* 2.9*   No results for input(s): LIPASE, AMYLASE in the last 168 hours. No results for input(s): AMMONIA in the last 168 hours. Cardiac Enzymes: No results for input(s): CKTOTAL, CKMB, CKMBINDEX, TROPONINI in the last 168 hours. BNP (last 3 results) No results for input(s): BNP in the last 8760 hours.  ProBNP (last 3 results) No results for input(s): PROBNP in the last 8760 hours.  CBG: No results for input(s): GLUCAP in the last 168 hours. No results found for this or any previous visit (from the  past 240 hour(s)).   Studies: No results found.    Flora Lipps, MD  [facility] 05/31/2020

## 2020-05-31 NOTE — Progress Notes (Signed)
Physical Therapy Treatment Patient Details Name: Manuel Ortega MRN: [id number] DOB: 16-Aug-1951 Today's Date: 05/31/2020    History of Present Illness 69 y.o. male with medical history significant of major depression and hypertension,  not taking any medication,  has no primary care physician, he has no medical care in the last 5 years presenting in the [facility] emergency department with nausea and vomiting. He denies any history of kidney disease. Pt found to have acute kidney injury due to obstructive uropathy and uremia. HD initiated.     PT Comments    Pt admitted with above diagnosis. Pt was able to ambulate with rollator with min guard assist with multiple sitting rest breaks with pt able to judge when he needed to rest.  Pt incr distance but definitely needs breaks and fatigues easily. Continue to progress pt.    Pt currently with functional limitations due to the deficits listed below (see PT Problem List). Pt will benefit from skilled PT to increase their independence and safety with mobility to allow discharge to the venue listed below.     Follow Up Recommendations  SNF     Equipment Recommendations  Other (comment);3in1 (PT) (rollator)    Recommendations for Other Services OT consult     Precautions / Restrictions Precautions Precautions: Fall Precaution Comments: highly anxious, bladder irrigation Restrictions Other Position/Activity Restrictions: Monitor HR    Mobility  Bed Mobility Overal bed mobility: Needs Assistance Bed Mobility: Supine to Sit     Supine to sit: Supervision Sit to supine: Supervision      Transfers Overall transfer level: Needs assistance Equipment used: 4-wheeled walker Transfers: Sit to/from Stand Sit to Stand: Min guard;Min assist         General transfer comment: cues for hand placement and steadying assist prn  Ambulation/Gait Ambulation/Gait assistance: Min guard Gait Distance (Feet): 350 Feet (50, 100, 50, 100, 50 ) Assistive  device: 4-wheeled walker Gait Pattern/deviations: Step-through pattern;Decreased stride length Gait velocity: decr Gait velocity interpretation: <1.8 ft/sec, indicate of risk for recurrent falls General Gait Details: hands on min guard for safety with pt judging distances entire time of when to sit and rest on rollator.  Pt had no dizziness until he was back in bed in room. No episodes of imbalance as well.  Pt was able to monitor his seated rest breaks with out assist.  Was fatigued at end of walk.     Stairs             Wheelchair Mobility    Modified Rankin (Stroke Patients Only)       Balance Overall balance assessment: Needs assistance Sitting-balance support: No upper extremity supported;Feet supported Sitting balance-Leahy Scale: Fair Sitting balance - Comments: requires UE support as position of comfort   Standing balance support: No upper extremity supported;During functional activity Standing balance-Leahy Scale: Fair Standing balance comment: needs UE support with dynamic activities                            Cognition Arousal/Alertness: Awake/alert Behavior During Therapy: Anxious;Impulsive Overall Cognitive Status: Within Functional Limits for tasks assessed Area of Impairment: Problem solving                 Orientation Level: Place     Following Commands: Follows one step commands consistently Safety/Judgement: Decreased awareness of deficits Awareness: Emergent Problem Solving: Requires verbal cues;Requires tactile cues;Slow processing General Comments: anxious about mobility      Exercises  General Comments        Pertinent Vitals/Pain Pain Assessment: No/denies pain    Home Living                      Prior Function            PT Goals (current goals can now be found in the care plan section) Acute Rehab PT Goals Patient Stated Goal: To improve mobility and find more assistance Progress towards PT  goals: Progressing toward goals    Frequency    Min 2X/week      PT Plan Current plan remains appropriate    Co-evaluation              AM-PAC PT "6 Clicks" Mobility   Outcome Measure  Help needed turning from your back to your side while in a flat bed without using bedrails?: None Help needed moving from lying on your back to sitting on the side of a flat bed without using bedrails?: A Little Help needed moving to and from a bed to a chair (including a wheelchair)?: A Little Help needed standing up from a chair using your arms (e.g., wheelchair or bedside chair)?: A Little Help needed to walk in hospital room?: A Little Help needed climbing 3-5 steps with a railing? : A Little 6 Click Score: 19    End of Session Equipment Utilized During Treatment: Gait belt Activity Tolerance: Patient limited by fatigue;Patient tolerated treatment well Patient left: with call bell/phone within reach;in bed;with bed alarm set;with SCD's reapplied Nurse Communication: Mobility status PT Visit Diagnosis: Unsteadiness on feet (R26.81);Other abnormalities of gait and mobility (R26.89);Muscle weakness (generalized) (M62.81)     Time: 0867-6195 PT Time Calculation (min) (ACUTE ONLY): 31 min  Charges:  $Gait Training: 23-37 mins                     Aundria Bitterman W,PT [place] Pager:  [phone]  Office:  [place] 05/31/2020, 1:36 PM

## 2020-06-01 LAB — BASIC METABOLIC PANEL
Anion gap: 11 (ref 5–15)
BUN: 29 mg/dL — ABNORMAL HIGH (ref 8–23)
CO2: 26 mmol/L (ref 22–32)
Calcium: 8.4 mg/dL — ABNORMAL LOW (ref 8.9–10.3)
Chloride: 99 mmol/L (ref 98–111)
Creatinine, Ser: 3.79 mg/dL — ABNORMAL HIGH (ref 0.61–1.24)
GFR calc Af Amer: 18 mL/min — ABNORMAL LOW (ref >60)
GFR calc non Af Amer: 15 mL/min — ABNORMAL LOW (ref >60)
Glucose, Bld: 92 mg/dL (ref 70–99)
Potassium: 3.3 mmol/L — ABNORMAL LOW (ref 3.5–5.1)
Sodium: 136 mmol/L (ref 135–145)

## 2020-06-01 LAB — CBC
HCT: 31.1 % — ABNORMAL LOW (ref 39.0–52.0)
Hemoglobin: 9.7 g/dL — ABNORMAL LOW (ref 13.0–17.0)
MCH: 28.1 pg (ref 26.0–34.0)
MCHC: 31.2 g/dL (ref 30.0–36.0)
MCV: 90.1 fL (ref 80.0–100.0)
Platelets: 176 10*3/uL (ref 150–400)
RBC: 3.45 MIL/uL — ABNORMAL LOW (ref 4.22–5.81)
RDW: 16.1 % — ABNORMAL HIGH (ref 11.5–15.5)
WBC: 12.1 10*3/uL — ABNORMAL HIGH (ref 4.0–10.5)
nRBC: 0 % (ref 0.0–0.2)

## 2020-06-01 LAB — RENAL FUNCTION PANEL
Albumin: 2.7 g/dL — ABNORMAL LOW (ref 3.5–5.0)
Anion gap: 13 (ref 5–15)
BUN: 29 mg/dL — ABNORMAL HIGH (ref 8–23)
CO2: 24 mmol/L (ref 22–32)
Calcium: 8.4 mg/dL — ABNORMAL LOW (ref 8.9–10.3)
Chloride: 99 mmol/L (ref 98–111)
Creatinine, Ser: 3.72 mg/dL — ABNORMAL HIGH (ref 0.61–1.24)
GFR calc Af Amer: 18 mL/min — ABNORMAL LOW (ref >60)
GFR calc non Af Amer: 16 mL/min — ABNORMAL LOW (ref >60)
Glucose, Bld: 91 mg/dL (ref 70–99)
Phosphorus: 2.4 mg/dL — ABNORMAL LOW (ref 2.5–4.6)
Potassium: 3.3 mmol/L — ABNORMAL LOW (ref 3.5–5.1)
Sodium: 136 mmol/L (ref 135–145)

## 2020-06-01 LAB — MAGNESIUM: Magnesium: 1.8 mg/dL (ref 1.7–2.4)

## 2020-06-01 MED ORDER — MAGNESIUM OXIDE 400 (241.3 MG) MG PO TABS
400.0000 mg | ORAL_TABLET | Freq: Every day | ORAL | Status: DC
Start: 2020-06-02 — End: 2023-10-16

## 2020-06-01 MED ORDER — MELATONIN 5 MG PO TABS
5.0000 mg | ORAL_TABLET | Freq: Every day | ORAL | 0 refills | Status: DC
Start: 2020-06-01 — End: 2023-10-16

## 2020-06-01 MED ORDER — DOCUSATE SODIUM 100 MG PO CAPS
100.0000 mg | ORAL_CAPSULE | Freq: Two times a day (BID) | ORAL | Status: DC
Start: 2020-06-01 — End: 2023-10-16

## 2020-06-01 MED ORDER — HEPARIN SODIUM (PORCINE) 1000 UNIT/ML IJ SOLN
INTRAMUSCULAR | Status: AC
  Administered 2020-06-01: 3200 [IU] via INTRAVENOUS_CENTRAL
  Filled 2020-06-01: qty 4

## 2020-06-01 MED ORDER — NEPRO/CARBSTEADY PO LIQD
237.0000 mL | Freq: Two times a day (BID) | ORAL | 0 refills | Status: DC
Start: 2020-06-01 — End: 2023-10-10

## 2020-06-01 MED ORDER — TAMSULOSIN HCL 0.4 MG PO CAPS
0.8000 mg | ORAL_CAPSULE | Freq: Every day | ORAL | Status: DC
Start: 2020-06-01 — End: 2023-10-16

## 2020-06-01 MED ORDER — POTASSIUM CHLORIDE CRYS ER 20 MEQ PO TBCR
40.0000 meq | EXTENDED_RELEASE_TABLET | Freq: Once | ORAL | Status: AC
  Administered 2020-06-01: 40 meq via ORAL
  Filled 2020-06-01: qty 2

## 2020-06-01 MED ORDER — FLUOXETINE HCL 40 MG PO CAPS: 40.0000 mg | ORAL_CAPSULE | Freq: Every day | ORAL | Status: DC

## 2020-06-01 NOTE — Discharge Summary (Addendum)
Physician Discharge Summary  Manuel Ortega VXB:[id number] DOB: Apr 20, 1951 DOA: 05/11/2020  PCP: System, Pcp Not In  Admit date: 05/11/2020 Discharge date: 06/01/2020  Admitted From: Home  Discharge disposition: Skilled nursing facility   Recommendations for Outpatient Follow-Up:   Follow up with your primary care provider at the skilled nursing facility in 3 to 5 days Check CBC, BMP, magnesium in the next visit Follow-up with urology clinic with Dr Alinda Money in 2 weeks to address Foley catheter.  Continue Foley catheter until seen by urology.  Please consider leg bag for ease with ambulation.   Discharge Diagnosis:   Principal Problem:   Acute kidney injury ([place]) Active Problems:   Hypokalemia   Hypocalcemia   Hypomagnesemia   Nausea and vomiting  Discharge Condition: Improved.  Diet recommendation: Low sodium, heart healthy.    Wound care: None.  Code status: Full.   History of Present Illness:   69 y.o. male past medical history significant of depression and hypertension, noncompliant with  medication with no primary care doctor for over the last 5 years, presented to the [facility] long hospital with nausea, vomiting that started 4 days prior to admission.  In the ED, he was found to have a white count of 13, BUN of 127, creatinine of 18.0, potassium was 2.6, anion gap of 29 and a bicarb of 23.  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  Patient was then admitted to the hospital.  Nephrology was consulted, as well as IR for tunneled HD catheter.  Patient subsequently underwent hemodialysis.  Hospital Course:   Following conditions were addressed during hospitalization as listed below,  Acute kidney injury due to obstructive uropathy resulting in uremia and nausea vomiting. Patient underwent hemodialysis catheter placement and hemodialysis on 05/12/2000.  Patient was continued on hemodialysis during hospitalization..  Patient has been having  good urinary output.  Hoping for eventual recovery.  Latest  creatinine of 3.7.  Patient will resume hemodialysis as outpatient.  Patient did have a tunneled hemodialysis catheter 3 days back.  Obstructive uropathy/BPH with hematuria and urinary retention. Patient received 4 units of packed RBC during hospitalization.  Currently on a Foley catheter.  Has been seen by urology.  Will need outpatient follow-up with urology after discharge.     Vasovagal syncope During bowel movement on 05/29/2020.  No orthostatic hypotension    Hypokalemia/Hypomagnesemia/Hypocalcemia/Hyponatremia/Secondary hyperphosphatemia Improved..  Sodium of 136, potassium 3.3 today and received 1 dose of 40 mEq of potassium prior to discharge.  Potassium can be adjusted with hemodialysis.    iron deficiency anemia with acute blood loss anemia. Status post 4 packed RBC.  Latest hemoglobin of 9.7 prior to discharge.  Monitor as outpatient.   Leukocytosis:     - resolved; likely reactive.     Elevated lipase    Likely secondary nausea vomiting.   Prolonged QTc, resolved.  Monitor electrolytes as outpatient.   Depression Has been started on SSRI since QTC has improved.  Will need to closely monitor electrolytes as outpatient.  Currently on Prozac 40 mg daily.  Disposition.  At this time, patient is stable for disposition to skilled nursing facility.  Patient will need to follow-up with primary care provider, urology as outpatient.  Medical Consultants:   Nephrology Interventional radiology  Procedures:    Hemodialysis  Right internal jugular hemodialysis catheter placement 7/30 Tunneled right internal jugular hemodialysis catheter placement Urethral catheter Transfusion of PRBC Subjective:   Today, patient seen and examined at bedside.  Patient feels okay.  No shortness of breath, fever, chills or rigor.  Discharge Exam:   Vitals:   06/01/20 0440 06/01/20 1126  BP: 123/73 115/71  Pulse: 96 94  Resp: 16  18  Temp: 98.2 F (36.8 C) 98.7 F (37.1 C)  SpO2: 99% 100%   Vitals:   06/01/20 0400 06/01/20 0420 06/01/20 0440 06/01/20 1126  BP: 106/63 130/71 123/73 115/71  Pulse: 93 92 96 94  Resp:  $Remo'14 16 18  'McFKk$ Temp:  98.4 F (36.9 C) 98.2 F (36.8 C) 98.7 F (37.1 C)  TempSrc:  Oral Oral Oral  SpO2:  94% 99% 100%  Weight:  101 kg 99.3 kg   Height:        General: Alert awake, not in obvious distress HENT: pupils equally reacting to light, mild pallor noted. Oral mucosa is moist.  Right internal jugular hemodialysis catheter in place. Chest:  Clear breath sounds.  Diminished breath sounds bilaterally. No crackles or wheezes.  CVS: S1 &S2 heard. No murmur.  Regular rate and rhythm. Abdomen: Soft, nontender, nondistended.  Bowel sounds are heard.  Foley catheter in place. Extremities: No cyanosis, clubbing or edema.  Peripheral pulses are palpable. Psych: Alert, awake and oriented, normal mood CNS:  No cranial nerve deficits.  Power equal in all extremities.   Skin: Warm and dry.  No rashes noted.  The results of significant diagnostics from this hospitalization (including imaging, microbiology, ancillary and laboratory) are listed below for reference.     Diagnostic Studies:   CT ABDOMEN PELVIS WO CONTRAST  Result Date: 05/11/2020 CLINICAL DATA:  Acute renal failure. Nausea and vomiting. Diarrhea for 2 days. Dysuria. EXAM: CT ABDOMEN AND PELVIS WITHOUT CONTRAST TECHNIQUE: Multidetector CT imaging of the abdomen and pelvis was performed following the standard protocol without IV contrast. COMPARISON:  None. FINDINGS: Lower chest: 5 mm pulmonary lymph node in the right lower lobe, series 4, image 2 is likely a perifissural intrapulmonary lymph node but nonspecific. Heart is normal in size. No pleural effusion. Hepatobiliary: No focal liver abnormality is seen. No gallstones, gallbladder wall thickening, or biliary dilatation. Pancreas: No ductal dilatation or inflammation. Single calcification  in the uncinate process of the pancreas. Spleen: Normal in size without focal abnormality. Adrenals/Urinary Tract: Bilateral adrenal thickening without dominant nodule. There is bilateral hydroureteronephrosis. Both ureters are dilated to bladder insertion. Bilateral perinephric edema that is symmetric. There is thinning of the bilateral renal parenchyma. Cortical cysts in the left kidney. There is marked urinary bladder distention with bladder spanning 23 cm cranial caudal extending into the right aspect of the abdomen. Bladder wall appears trabeculated inferiorly with probable multiple small diverticula. No definite bladder wall thickening or obvious focal bladder lesion. There is mass effect on the bladder base from prostate gland. Stomach/Bowel: Fluid levels in the stomach without evidence of gastric wall thickening. No abnormal gastric distension. Short of the small bowel is decompressed without obstruction, inflammation, or abnormal distention. Colonic diverticulosis throughout the entire colon, including the cecum. No diverticulitis. No colonic wall thickening or inflammatory change. Normal appendix. Vascular/Lymphatic: Aortic atherosclerosis. No aortic aneurysm. No bulky abdominopelvic adenopathy. Reproductive: Enlarged prostate gland spanning 5.5 x 5.3 x 5.4 cm (volume = 82 cm^3). Other: Fat in both inguinal canals. Tiny fat containing umbilical hernia. No ascites or free air. Musculoskeletal: Degenerative change throughout the spine. There are no acute or suspicious osseous abnormalities. IMPRESSION: 1. Marked urinary bladder distention with bladder wall trabeculation and probable multiple small diverticula. Bilateral hydroureteronephrosis, likely due to  distended bladder. Suspect chronic bladder outlet obstruction. 2. Enlarged prostate gland causing mass effect on the bladder base. 3. Colonic diverticulosis without diverticulitis. 4. A 5 mm pulmonary lymph node in the right lower lobe is likely a  perifissural intrapulmonary lymph node but nonspecific. Consider follow-up chest CT 1 year if patient is high-risk. Aortic Atherosclerosis (ICD10-I70.0). Electronically Signed   By: Keith Rake M.D.   On: 05/11/2020 17:59   IR Fluoro Guide CV Line Right  Result Date: 05/12/2020 INDICATION: Obstructive uropathy secondary to BPH now with acute on chronic renal insufficiency, hopes to be improved with Foley catheter insertion and initiation of dialysis. Patient also with elevated white blood cell count. As such, request made for placement of a temporary dialysis catheter for the initiation of dialysis. EXAM: NON-TUNNELED CENTRAL VENOUS HEMODIALYSIS CATHETER PLACEMENT WITH ULTRASOUND AND FLUOROSCOPIC GUIDANCE COMPARISON:  None. Anesthesia/sedation: Moderate (conscious) sedation was employed during this procedure. A total of Fentanyl 100 mcg was administered intravenously. Moderate Sedation Time: 10 minutes. The patient's level of consciousness and vital signs were monitored continuously by radiology nursing throughout the procedure under my direct supervision. MEDICATIONS: None FLUOROSCOPY TIME:  24 seconds (7 mGy) COMPLICATIONS: None immediate. PROCEDURE: Informed written consent was obtained from the patient after a discussion of the risks, benefits, and alternatives to treatment. Questions regarding the procedure were encouraged and answered. The right neck and chest were prepped with chlorhexidine in a sterile fashion, and a sterile drape was applied covering the operative field. Maximum barrier sterile technique with sterile gowns and gloves were used for the procedure. A timeout was performed prior to the initiation of the procedure. After the overlying soft tissues were anesthetized, a small venotomy incision was created and a micropuncture kit was utilized to access the internal jugular vein. Real-time ultrasound guidance was utilized for vascular access including the acquisition of a permanent  ultrasound image documenting patency of the accessed vessel. The microwire was utilized to measure appropriate catheter length. A stiff glidewire was advanced to the level of the IVC. Under fluoroscopic guidance, the venotomy was serially dilated, ultimately allowing placement of a 20 cm temporary Trialysis catheter with tip ultimately terminating within the superior aspect of the right atrium. Final catheter positioning was confirmed and documented with a spot radiographic image. The catheter aspirates and flushes normally. The catheter was flushed with appropriate volume heparin dwells. The catheter exit site was secured with a 0-Prolene retention suture. A dressing was placed. The patient tolerated the procedure well without immediate post procedural complication. IMPRESSION: Successful placement of a right internal jugular approach 20 cm temporary dialysis catheter with tip terminating with in the superior aspect of the right atrium. The catheter is ready for immediate use. PLAN: This catheter may be converted to a tunneled dialysis catheter at a later date as indicated. Note, given obstructive uropathy seen on preceding noncontrast abdominal CT performed 05/11/2020 would have low threshold for repeat noncontrast abdominal CT in the coming days to ensure adequate urinary diversion following Foley catheter insertion. Otherwise, would give consideration for urologic referral for evaluation for potential bilateral double-J ureteral stent placement and ultimately if not a ureteral stent candidate, for bilateral percutaneous nephrostomy catheter placement. Electronically Signed   By: Sandi Mariscal M.D.   On: 05/12/2020 17:29   IR US Guide Vasc Access Right  Result Date: 05/12/2020 INDICATION: Obstructive uropathy secondary to BPH now with acute on chronic renal insufficiency, hopes to be improved with Foley catheter insertion and initiation of dialysis. Patient also with elevated  white blood cell count. As such,  request made for placement of a temporary dialysis catheter for the initiation of dialysis. EXAM: NON-TUNNELED CENTRAL VENOUS HEMODIALYSIS CATHETER PLACEMENT WITH ULTRASOUND AND FLUOROSCOPIC GUIDANCE COMPARISON:  None. Anesthesia/sedation: Moderate (conscious) sedation was employed during this procedure. A total of Fentanyl 100 mcg was administered intravenously. Moderate Sedation Time: 10 minutes. The patient's level of consciousness and vital signs were monitored continuously by radiology nursing throughout the procedure under my direct supervision. MEDICATIONS: None FLUOROSCOPY TIME:  24 seconds (7 mGy) COMPLICATIONS: None immediate. PROCEDURE: Informed written consent was obtained from the patient after a discussion of the risks, benefits, and alternatives to treatment. Questions regarding the procedure were encouraged and answered. The right neck and chest were prepped with chlorhexidine in a sterile fashion, and a sterile drape was applied covering the operative field. Maximum barrier sterile technique with sterile gowns and gloves were used for the procedure. A timeout was performed prior to the initiation of the procedure. After the overlying soft tissues were anesthetized, a small venotomy incision was created and a micropuncture kit was utilized to access the internal jugular vein. Real-time ultrasound guidance was utilized for vascular access including the acquisition of a permanent ultrasound image documenting patency of the accessed vessel. The microwire was utilized to measure appropriate catheter length. A stiff glidewire was advanced to the level of the IVC. Under fluoroscopic guidance, the venotomy was serially dilated, ultimately allowing placement of a 20 cm temporary Trialysis catheter with tip ultimately terminating within the superior aspect of the right atrium. Final catheter positioning was confirmed and documented with a spot radiographic image. The catheter aspirates and flushes normally.  The catheter was flushed with appropriate volume heparin dwells. The catheter exit site was secured with a 0-Prolene retention suture. A dressing was placed. The patient tolerated the procedure well without immediate post procedural complication. IMPRESSION: Successful placement of a right internal jugular approach 20 cm temporary dialysis catheter with tip terminating with in the superior aspect of the right atrium. The catheter is ready for immediate use. PLAN: This catheter may be converted to a tunneled dialysis catheter at a later date as indicated. Note, given obstructive uropathy seen on preceding noncontrast abdominal CT performed 05/11/2020 would have low threshold for repeat noncontrast abdominal CT in the coming days to ensure adequate urinary diversion following Foley catheter insertion. Otherwise, would give consideration for urologic referral for evaluation for potential bilateral double-J ureteral stent placement and ultimately if not a ureteral stent candidate, for bilateral percutaneous nephrostomy catheter placement. Electronically Signed   By: Sandi Mariscal M.D.   On: 05/12/2020 17:29     Labs:   Basic Metabolic Panel: Recent Labs  Lab 05/28/20 0418 05/28/20 0418 05/29/20 0404 05/29/20 0404 05/30/20 0911 05/30/20 0911 05/31/20 0430 06/01/20 0540  NA 137  --  135  --  136  --  135 136  136  K 3.9   < > 4.0   < > 3.8   < > 4.1 3.3*  3.3*  CL 105  --  100  --  102  --  100 99  99  CO2 20*  --  24  --  22  --  21* 26  24  GLUCOSE 86  --  89  --  133*  --  90 92  91  BUN 113*  --  48*  --  64*  --  75* 29*  29*  CREATININE 9.02*  --  5.44*  --  6.52*  --  7.35* 3.79*  3.72*  CALCIUM 8.6*  --  8.1*  --  8.2*  --  8.6* 8.4*  8.4*  MG  --   --   --   --   --   --  2.5* 1.8  PHOS 5.8*  --  3.3  --  3.5  --  4.5 2.4*   < > = values in this interval not displayed.   GFR Estimated Creatinine Clearance: 23.6 mL/min (A) (by C-G formula based on SCr of 3.72 mg/dL (H)). Liver  Function Tests: Recent Labs  Lab 05/28/20 0418 05/29/20 0404 05/30/20 0911 05/31/20 0430 06/01/20 0540  AST 11*  --   --   --   --   ALT 14  --   --   --   --   ALKPHOS 57  --   --   --   --   BILITOT 0.3  --   --   --   --   PROT 5.7*  --   --   --   --   ALBUMIN 2.6* 2.8* 2.7* 2.9* 2.7*   No results for input(s): LIPASE, AMYLASE in the last 168 hours. No results for input(s): AMMONIA in the last 168 hours. Coagulation profile No results for input(s): INR, PROTIME in the last 168 hours.  CBC: Recent Labs  Lab 05/28/20 0801 05/30/20 1219 05/31/20 0430 06/01/20 0540  WBC 8.1 9.5 10.4 12.1*  HGB 9.1* 10.0* 10.2* 9.7*  HCT 29.8* 32.2* 32.6* 31.1*  MCV 91.4 92.0 92.1 90.1  PLT 215 201 209 176   Cardiac Enzymes: No results for input(s): CKTOTAL, CKMB, CKMBINDEX, TROPONINI in the last 168 hours. BNP: Invalid input(s): POCBNP CBG: No results for input(s): GLUCAP in the last 168 hours. D-Dimer No results for input(s): DDIMER in the last 72 hours. Hgb A1c No results for input(s): HGBA1C in the last 72 hours. Lipid Profile No results for input(s): CHOL, HDL, LDLCALC, TRIG, CHOLHDL, LDLDIRECT in the last 72 hours. Thyroid function studies No results for input(s): TSH, T4TOTAL, T3FREE, THYROIDAB in the last 72 hours.  Invalid input(s): FREET3 Anemia work up No results for input(s): VITAMINB12, FOLATE, FERRITIN, TIBC, IRON, RETICCTPCT in the last 72 hours. Microbiology Recent Results (from the past 240 hour(s))  SARS CORONAVIRUS 2 (TAT 6-24 HRS) Nasopharyngeal Nasopharyngeal Swab     Status: None   Collection Time: 05/31/20 11:41 AM   Specimen: Nasopharyngeal Swab  Result Value Ref Range Status   SARS Coronavirus 2 NEGATIVE NEGATIVE Final    Comment: (NOTE) SARS-CoV-2 target nucleic acids are NOT DETECTED.  The SARS-CoV-2 RNA is generally detectable in upper and lower respiratory specimens during the acute phase of infection. Negative results do not preclude  SARS-CoV-2 infection, do not rule out co-infections with other pathogens, and should not be used as the sole basis for treatment or other patient management decisions. Negative results must be combined with clinical observations, patient history, and epidemiological information. The expected result is Negative.  Fact Sheet for Patients: HairSlick.no  Fact Sheet for Healthcare Providers: quierodirigir.com  This test is not yet approved or cleared by the [place] FDA and  has been authorized for detection and/or diagnosis of SARS-CoV-2 by FDA under an Emergency Use Authorization (EUA). This EUA will remain  in effect (meaning this test can be used) for the duration of the COVID-19 declaration under Se ction 564(b)(1) of the Act, 21 U.S.C. section 360bbb-3(b)(1), unless the authorization is terminated or revoked sooner.  Performed at [facility]  [facility], [place] [address] [place], [place] [address]      Discharge Instructions:   Discharge Instructions     Diet - low sodium heart healthy   Complete by: As directed    Discharge instructions   Complete by: As directed    Continue hemodialysis as planned.  Check CBC ,BMP magnesium phosphorus in the next visit.  Follow-up with  the primary care provider at the skilled nursing facility in 3 to 5 days.  Continue to use a Foley catheter until seen by urologist in 2 weeks   Increase activity slowly   Complete by: As directed    No wound care   Complete by: As directed       Allergies as of 06/01/2020       Reactions   Penicillins         Medication List     TAKE these medications    divalproex 125 MG DR tablet Commonly known as: DEPAKOTE Take 125 mg by mouth 2 (two) times daily.   docusate sodium 100 MG capsule Commonly known as: COLACE Take 1 capsule (100 mg total) by mouth 2 (two) times daily.   feeding supplement (NEPRO CARB STEADY) Liqd Take 237 mLs by  mouth 2 (two) times daily between meals.   FLUoxetine 40 MG capsule Commonly known as: PROZAC Take 1 capsule (40 mg total) by mouth daily. Start taking on: June 02, 2020   latanoprost 0.005 % ophthalmic solution Commonly known as: XALATAN Place 1 drop into both eyes at bedtime.   magnesium oxide 400 (241.3 Mg) MG tablet Commonly known as: MAG-OX Take 1 tablet (400 mg total) by mouth daily. Start taking on: June 02, 2020   melatonin 5 MG Tabs Take 1 tablet (5 mg total) by mouth at bedtime.   QUEtiapine 200 MG tablet Commonly known as: SEROQUEL Take 200 mg by mouth at bedtime.   tamsulosin 0.4 MG Caps capsule Commonly known as: FLOMAX Take 2 capsules (0.8 mg total) by mouth daily after supper.        Follow-up Information     Raynelle Bring, MD. Schedule an appointment as soon as possible for a visit in 2 week(s).   Specialty: Urology Why: 2 weeks to adderess foley catheter Contact information: [place] [address] [phone]         [facility], [facility] Follow up.   Specialty: Home Health Services Contact information: [place] [address] [phone]         [facility] Follow up.   Specialty: Geriatric Medicine Why: [phone]  Contact information: [address] [place] [address] [phone]                  Time coordinating discharge: 39 minutes  Signed:  Lavontay Kirk  [facility] 06/01/2020, 12:23 PM

## 2020-06-01 NOTE — TOC Transition Note (Signed)
Transition of Care [facility]) - CM/SW Discharge Note   Patient Details  Name: Manuel Ortega MRN: [id number] Date of Birth: 1951/10/06  [facility] CM/SW Contact:  Amador Cunas, [place] Phone Number: 06/01/2020, 12:11 PM   Clinical Narrative:   Pt for dc to [facility] today. Confirmed with Juliann Pulse at [facility] they will admit pt to room 104 today. RN provided with number for report [phone]. HD SW arranged outpt HD and transportation (see note for details). Pt aware of dc and reports agreeable. SW signing off at dc.   Wandra Feinstein, MSW, LCSW [phone] (coverage)       Final next level of care: Skilled Nursing Facility Barriers to Discharge: No Barriers Identified   Patient Goals and CMS Choice Patient states their goals for this hospitalization and ongoing recovery are:: Get stronger enough to go home. CMS Medicare.gov Compare Post Acute Care list provided to:: Patient Choice offered to / list presented to : Patient  Discharge Placement              Patient chooses bed at: [facility] Patient to be transferred to facility by: [place] Name of family member notified: Pt to notify family Patient and family notified of of transfer: 06/01/20  Discharge Plan and Services In-house Referral: Clinical Social Work   Post Acute Care Choice: [place]                    HH Arranged: PT, OT, Nurse's Aide [place] Agency: [place] Date [place]: 05/22/20 Time [place]: 1203 Representative spoke with at [place]: [place] ([place]) Interventions     Readmission Risk Interventions Readmission Risk Prevention Plan 05/30/2020  Transportation Screening Complete  PCP or Specialist Appt within 3-5 Days Complete  HRI or [place] Complete  Social Work Consult for [place] Planning/Counseling Complete  Palliative Care Screening Not Applicable  Medication Review Press photographer)  Complete

## 2020-06-01 NOTE — Progress Notes (Signed)
Renal Navigator collaborated with CSW/J. Drema Dallas, who states patient will discharge to [facility] today. Navigator arranged transportation to and from OP HD/NW clinic tomorrow, 06/02/20. He will be picked up at [facility] between 9:15-9:45am and then taken back to [facility] from [place] between 4:00-4:15pm.  Navigator asked Renal PA/D. Zeyfang to send HD orders.  Alphonzo Cruise, [place] Renal Navigator [phone]

## 2020-06-01 NOTE — Progress Notes (Signed)
Occupational Therapy Treatment Patient Details Name: Manuel Ortega MRN: [id number] DOB: September 05, 1951 Today's Date: 06/01/2020    History of present illness 69 y.o. male with medical history significant of major depression and hypertension,  not taking any medication,  has no primary care physician, he has no medical care in the last 5 years presenting in the [facility] emergency department with nausea and vomiting. He denies any history of kidney disease. Pt found to have acute kidney injury due to obstructive uropathy and uremia. HD initiated.    OT comments  Pt. Was ed on increasing I and safety with ADLs and ADL transfers. Pt. Is doing well with mobility in room. Pt. Has decreased tolerance for activity and needs to take rest during activities. Pt. Wants to go to SNF for rehab prior to dc home.   Follow Up Recommendations  SNF (Pt. want to go to SNF for rehab prior to returning home. )    Equipment Recommendations  3 in 1 bedside commode    Recommendations for Other Services      Precautions / Restrictions Precautions Precautions: Fall Precaution Comments: highly anxious, bladder irrigation Restrictions Weight Bearing Restrictions: No       Mobility Bed Mobility         Supine to sit: Modified independent (Device/Increase time) Sit to supine: Modified independent (Device/Increase time)      Transfers Overall transfer level: Needs assistance   Transfers: Sit to/from Stand;Stand Pivot Transfers Sit to Stand: Supervision Stand pivot transfers: Min guard            Balance     Sitting balance-Leahy Scale: Good       Standing balance-Leahy Scale: Fair                             ADL either performed or assessed with clinical judgement   ADL       Grooming: Supervision/safety;Standing;Wash/dry hands;Wash/dry face               Lower Body Dressing: Minimal assistance;Sit to/from stand   Toilet Transfer: Social research officer, government and Hygiene: Supervision/safety       Functional mobility during ADLs: Min guard General ADL Comments: Pt. sat EOB for LE ADLs. Pt. AMB into bathroom.      Vision   Vision Assessment?: No apparent visual deficits   Perception     Praxis      Cognition Arousal/Alertness: Awake/alert Behavior During Therapy: WFL for tasks assessed/performed                                            Exercises     Shoulder Instructions       General Comments      Pertinent Vitals/ Pain       Pain Assessment: No/denies pain  Home Living                                          Prior Functioning/Environment              Frequency  Min 2X/week        Progress Toward Goals  OT Goals(current goals can now be found in the care plan section)  Progress towards  OT goals: Progressing toward goals  Acute Rehab OT Goals Patient Stated Goal: To improve mobility and find more assistance OT Goal Formulation: With patient Time For Goal Achievement: 06/08/20 Potential to Achieve Goals: Good ADL Goals Pt Will Perform Grooming: with modified independence;standing Pt Will Perform Lower Body Bathing: with modified independence;with adaptive equipment;sitting/lateral leans;sit to/from stand Pt Will Perform Lower Body Dressing: with modified independence;with adaptive equipment;sitting/lateral leans;sit to/from stand Pt Will Transfer to Toilet: with modified independence;ambulating;bedside commode Pt Will Perform Toileting - Clothing Manipulation and hygiene: with modified independence;sitting/lateral leans;sit to/from stand Pt Will Perform Tub/Shower Transfer: Tub transfer;3 in 1;rolling walker Pt/caregiver will Perform Home Exercise Program: Increased strength;Both right and left upper extremity;With theraband;With written HEP provided  Plan Discharge plan remains appropriate    Co-evaluation                 AM-PAC OT "6 Clicks"  Daily Activity     Outcome Measure   Help from another person eating meals?: None Help from another person taking care of personal grooming?: A Little Help from another person toileting, which includes using toliet, bedpan, or urinal?: A Little Help from another person bathing (including washing, rinsing, drying)?: A Little Help from another person to put on and taking off regular upper body clothing?: None Help from another person to put on and taking off regular lower body clothing?: A Little 6 Click Score: 20    End of Session    OT Visit Diagnosis: Muscle weakness (generalized) (M62.81);Unsteadiness on feet (R26.81)   Activity Tolerance Patient tolerated treatment well   Patient Left in bed;with call bell/phone within reach;with bed alarm set   Nurse Communication          Time: 0037-0488 OT Time Calculation (min): 40 min  Charges: OT General Charges $OT Visit: 1 Visit OT Treatments $Self Care/Home Management : 38-52 mins  Reece Packer OT/L   Lorrinda Ramstad 06/01/2020, 10:01 AM

## 2020-06-01 NOTE — Care Management Important Message (Signed)
Important Message  Patient Details  Name: Manuel Ortega MRN: [id number] Date of Birth: 09-20-1951   Medicare Important Message Given:  Yes     Shelda Altes 06/01/2020, 1:45 PM

## 2020-06-01 NOTE — Social Work (Signed)
Pt has accepted offer from [facility] HD arranged at [facility] TTS at 11:25am and transport arranged with [facility]. Spoke to Tyndall with [facility] who confirmed they are able to admit pt to room 104 today. Nephrology SW arranged transportation with [facility] for HD tomorrow, [facility] aware of details.   Wandra Feinstein, MSW, LCSW [phone] (coverage)

## 2020-06-01 NOTE — Plan of Care (Signed)
  Problem: Education: ?Goal: Knowledge of General Education information will improve ?Description: Including pain rating scale, medication(s)/side effects and non-pharmacologic comfort measures ?Outcome: Adequate for Discharge ?  ?Problem: Health Behavior/Discharge Planning: ?Goal: Ability to manage health-related needs will improve ?Outcome: Adequate for Discharge ?  ?Problem: Clinical Measurements: ?Goal: Ability to maintain clinical measurements within normal limits will improve ?Outcome: Adequate for Discharge ?Goal: Will remain free from infection ?Outcome: Adequate for Discharge ?Goal: Diagnostic test results will improve ?Outcome: Adequate for Discharge ?Goal: Respiratory complications will improve ?Outcome: Adequate for Discharge ?Goal: Cardiovascular complication will be avoided ?Outcome: Adequate for Discharge ?  ?Problem: Activity: ?Goal: Risk for activity intolerance will decrease ?Outcome: Adequate for Discharge ?  ?Problem: Coping: ?Goal: Level of anxiety will decrease ?Outcome: Adequate for Discharge ?  ?Problem: Elimination: ?Goal: Will not experience complications related to bowel motility ?Outcome: Adequate for Discharge ?Goal: Will not experience complications related to urinary retention ?Outcome: Adequate for Discharge ?  ?Problem: Safety: ?Goal: Ability to remain free from injury will improve ?Outcome: Adequate for Discharge ?  ?

## 2020-06-01 NOTE — Progress Notes (Signed)
D/C instructions printed and placed in packet at nurse's stations for transport to SNF. Tele and IV removed, tolerated well.

## 2020-06-01 NOTE — Progress Notes (Signed)
[facility] Progress Note     Assessment/ Plan:   1. AKI from obstructive uropathy: Started dialysis on 8/1 for uremia with intermittent breaks due to good urine output.  However the patient has continued to have issues with poor clearance requiring scheduled dialysis.  Hopeful for the patient to recover with time.  Delaying fistula placement at this time.  Patient is set up for outpatient dialysis once outpatient rehab has been arranged.  Dialysis TTS schedule.  Monitoring labs daily for potential recovery.  Plan for dialysis tomorrow unless creatinine improves on its own. 2. 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 Elevated PSA from #2, probably, maintain foley- uro f/u-  4. Depression: Symptoms stable 5. Normocytic anemia, TSAT 26%- gave IV iron- feraheme times 2 doses - discovered to have a hgb under 6- Got 2 units PRBC on 8/3- - dosed with ESA as well-- more blood on 8/5.Continue to monitor hemoglobin dose ESA as needed  Subjective:    Stable over the past 24 hours.  Tolerated dialysis yesterday.  Urine output of 1.8 L.  Patient feels well without complaints today.   Objective:   BP 123/73 (BP Location: Left Arm)   Pulse 96   Temp 98.2 F (36.8 C) (Oral)   Resp 16   Ht 6\' 1"  (1.854 m)   Wt 99.3 kg   SpO2 99%   BMI 28.88 kg/m   Intake/Output Summary (Last 24 hours) at 06/01/2020 1112 Last data filed at 06/01/2020 0920 Gross per 24 hour  Intake 1200 ml  Output 1700 ml  Net -500 ml   Weight change:   Physical Exam: General: nad-TDC in place Heart: Normal rate Lungs: Bilateral chest rise, no increased work of breathing Abdomen: soft, NABS Extremities: no edema in the lower extremities, warm and well perfused GU: foley drainingclear Neuro: Alert, answers questions appropriately  Imaging: No results found.  Labs: BMET Recent Labs  Lab  05/26/20 0446 05/27/20 0509 05/28/20 0418 05/29/20 0404 05/30/20 0911 05/31/20 0430 06/01/20 0540  NA 136 135 137 135 136 135 136  136  K 3.8 3.8 3.9 4.0 3.8 4.1 3.3*  3.3*  CL 102 103 105 100 102 100 99  99  CO2 21* 20* 20* 24 22 21* 26  24  GLUCOSE 86 88 86 89 133* 90 92  91  BUN 99* 110* 113* 48* 64* 75* 29*  29*  CREATININE 8.40* 8.48* 9.02* 5.44* 6.52* 7.35* 3.79*  3.72*  CALCIUM 8.4* 8.5* 8.6* 8.1* 8.2* 8.6* 8.4*  8.4*  PHOS 4.9* 5.0* 5.8* 3.3 3.5 4.5 2.4*   CBC Recent Labs  Lab 05/28/20 0801 05/30/20 1219 05/31/20 0430 06/01/20 0540  WBC 8.1 9.5 10.4 12.1*  HGB 9.1* 10.0* 10.2* 9.7*  HCT 29.8* 32.2* 32.6* 31.1*  MCV 91.4 92.0 92.1 90.1  PLT 215 201 209 176    Medications:    . calcium carbonate  800 mg of elemental calcium Oral TID  . Chlorhexidine Gluconate Cloth  6 each Topical Q0600  . divalproex  125 mg Oral BID  . docusate sodium  100 mg Oral BID  . feeding supplement (NEPRO CARB STEADY)  237 mL Oral BID BM  . FLUoxetine  40 mg Oral Daily  . latanoprost  1 drop Both Eyes QHS  . magnesium oxide  400 mg Oral Daily  . melatonin  5 mg Oral QHS  . multivitamin  1 tablet Oral QHS  .  psyllium  1 packet Oral Daily  . QUEtiapine  200 mg Oral QHS  . tamsulosin  0.8 mg Oral QPC supper      Reesa Chew  06/01/2020, 11:12 AM

## 2020-06-01 NOTE — Progress Notes (Signed)
Called report to [facility] [phone]. Spoke with Alma Downs, gave report.

## 2020-06-01 NOTE — Progress Notes (Signed)
   06/01/20 0440  Vitals  Temp 98.2 F (36.8 C)  Temp Source Oral  BP 123/73  MAP (mmHg) 86  BP Location Left Arm  BP Method Automatic  Patient Position (if appropriate) Lying  Pulse Rate 96  Pulse Rate Source Monitor  Resp 16  MEWS COLOR  MEWS Score Color Green  Oxygen Therapy  SpO2 99 %  O2 Device Room Air  MEWS Score  MEWS Temp 0  MEWS Systolic 0  MEWS Pulse 0  MEWS RR 0  MEWS LOC 0  MEWS Score 0  Patient returned from dialysis. Resting in bed no complaints at this time. Will continue to monitor.

## 2020-06-02 DIAGNOSIS — R52 Pain, unspecified: Secondary | ICD-10-CM | POA: Insufficient documentation

## 2020-06-02 DIAGNOSIS — Z23 Encounter for immunization: Secondary | ICD-10-CM | POA: Insufficient documentation

## 2020-06-02 DIAGNOSIS — D689 Coagulation defect, unspecified: Secondary | ICD-10-CM | POA: Insufficient documentation

## 2020-06-02 DIAGNOSIS — N189 Chronic kidney disease, unspecified: Secondary | ICD-10-CM | POA: Insufficient documentation

## 2020-06-02 DIAGNOSIS — T7840XS Allergy, unspecified, sequela: Secondary | ICD-10-CM | POA: Insufficient documentation

## 2020-06-02 DIAGNOSIS — Z87892 Personal history of anaphylaxis: Secondary | ICD-10-CM | POA: Insufficient documentation

## 2020-06-02 DIAGNOSIS — D631 Anemia in chronic kidney disease: Secondary | ICD-10-CM | POA: Insufficient documentation

## 2020-06-05 DIAGNOSIS — E440 Moderate protein-calorie malnutrition: Secondary | ICD-10-CM | POA: Insufficient documentation

## 2020-06-10 ENCOUNTER — Emergency Department (HOSPITAL_COMMUNITY): Payer: Medicare Other

## 2020-06-10 ENCOUNTER — Inpatient Hospital Stay (HOSPITAL_COMMUNITY)
Admission: EM | Admit: 2020-06-10 | Discharge: 2020-06-13 | DRG: 698 | Disposition: A | Discharge status: Skilled Nursing Facility | Payer: Medicare Other | Attending: Family Medicine | Admitting: Family Medicine

## 2020-06-10 DIAGNOSIS — R34 Anuria and oliguria: Secondary | ICD-10-CM | POA: Diagnosis present

## 2020-06-10 DIAGNOSIS — E8889 Other specified metabolic disorders: Secondary | ICD-10-CM | POA: Diagnosis present

## 2020-06-10 DIAGNOSIS — F32A Depression, unspecified: Secondary | ICD-10-CM | POA: Diagnosis present

## 2020-06-10 DIAGNOSIS — R310 Gross hematuria: Secondary | ICD-10-CM | POA: Diagnosis present

## 2020-06-10 DIAGNOSIS — T83511A Infection and inflammatory reaction due to indwelling urethral catheter, initial encounter: Secondary | ICD-10-CM | POA: Diagnosis not present

## 2020-06-10 DIAGNOSIS — F329 Major depressive disorder, single episode, unspecified: Secondary | ICD-10-CM | POA: Diagnosis present

## 2020-06-10 DIAGNOSIS — N138 Other obstructive and reflux uropathy: Secondary | ICD-10-CM | POA: Diagnosis present

## 2020-06-10 DIAGNOSIS — Z992 Dependence on renal dialysis: Secondary | ICD-10-CM

## 2020-06-10 DIAGNOSIS — D638 Anemia in other chronic diseases classified elsewhere: Secondary | ICD-10-CM | POA: Diagnosis present

## 2020-06-10 DIAGNOSIS — R0602 Shortness of breath: Secondary | ICD-10-CM

## 2020-06-10 DIAGNOSIS — Z880 Allergy status to penicillin: Secondary | ICD-10-CM

## 2020-06-10 DIAGNOSIS — N400 Enlarged prostate without lower urinary tract symptoms: Secondary | ICD-10-CM | POA: Diagnosis present

## 2020-06-10 DIAGNOSIS — N401 Enlarged prostate with lower urinary tract symptoms: Secondary | ICD-10-CM | POA: Diagnosis present

## 2020-06-10 DIAGNOSIS — Y846 Urinary catheterization as the cause of abnormal reaction of the patient, or of later complication, without mention of misadventure at the time of the procedure: Secondary | ICD-10-CM | POA: Diagnosis present

## 2020-06-10 DIAGNOSIS — I10 Essential (primary) hypertension: Secondary | ICD-10-CM | POA: Diagnosis present

## 2020-06-10 DIAGNOSIS — D649 Anemia, unspecified: Secondary | ICD-10-CM | POA: Diagnosis present

## 2020-06-10 DIAGNOSIS — R9431 Abnormal electrocardiogram [ECG] [EKG]: Secondary | ICD-10-CM | POA: Diagnosis present

## 2020-06-10 DIAGNOSIS — Z823 Family history of stroke: Secondary | ICD-10-CM

## 2020-06-10 DIAGNOSIS — N170 Acute kidney failure with tubular necrosis: Secondary | ICD-10-CM

## 2020-06-10 DIAGNOSIS — R52 Pain, unspecified: Secondary | ICD-10-CM

## 2020-06-10 DIAGNOSIS — R319 Hematuria, unspecified: Secondary | ICD-10-CM

## 2020-06-10 DIAGNOSIS — Z6828 Body mass index (BMI) 28.0-28.9, adult: Secondary | ICD-10-CM

## 2020-06-10 DIAGNOSIS — Z79899 Other long term (current) drug therapy: Secondary | ICD-10-CM

## 2020-06-10 DIAGNOSIS — B965 Pseudomonas (aeruginosa) (mallei) (pseudomallei) as the cause of diseases classified elsewhere: Secondary | ICD-10-CM | POA: Diagnosis present

## 2020-06-10 DIAGNOSIS — R55 Syncope and collapse: Secondary | ICD-10-CM

## 2020-06-10 DIAGNOSIS — R630 Anorexia: Secondary | ICD-10-CM | POA: Diagnosis present

## 2020-06-10 DIAGNOSIS — N2581 Secondary hyperparathyroidism of renal origin: Secondary | ICD-10-CM | POA: Diagnosis present

## 2020-06-10 DIAGNOSIS — R7989 Other specified abnormal findings of blood chemistry: Secondary | ICD-10-CM | POA: Diagnosis present

## 2020-06-10 DIAGNOSIS — N186 End stage renal disease: Secondary | ICD-10-CM | POA: Diagnosis present

## 2020-06-10 DIAGNOSIS — Z20822 Contact with and (suspected) exposure to covid-19: Secondary | ICD-10-CM | POA: Diagnosis present

## 2020-06-10 DIAGNOSIS — N139 Obstructive and reflux uropathy, unspecified: Secondary | ICD-10-CM | POA: Diagnosis present

## 2020-06-10 DIAGNOSIS — G4700 Insomnia, unspecified: Secondary | ICD-10-CM | POA: Diagnosis present

## 2020-06-10 DIAGNOSIS — I120 Hypertensive chronic kidney disease with stage 5 chronic kidney disease or end stage renal disease: Secondary | ICD-10-CM | POA: Diagnosis present

## 2020-06-10 DIAGNOSIS — N390 Urinary tract infection, site not specified: Secondary | ICD-10-CM | POA: Diagnosis present

## 2020-06-10 LAB — BASIC METABOLIC PANEL
Anion gap: 16 — ABNORMAL HIGH (ref 5–15)
BUN: 26 mg/dL — ABNORMAL HIGH (ref 8–23)
CO2: 25 mmol/L (ref 22–32)
Calcium: 8.1 mg/dL — ABNORMAL LOW (ref 8.9–10.3)
Chloride: 96 mmol/L — ABNORMAL LOW (ref 98–111)
Creatinine, Ser: 6.2 mg/dL — ABNORMAL HIGH (ref 0.61–1.24)
GFR calc Af Amer: 10 mL/min — ABNORMAL LOW (ref >60)
GFR calc non Af Amer: 8 mL/min — ABNORMAL LOW (ref >60)
Glucose, Bld: 94 mg/dL (ref 70–99)
Potassium: 3.7 mmol/L (ref 3.5–5.1)
Sodium: 137 mmol/L (ref 135–145)

## 2020-06-10 LAB — TROPONIN I (HIGH SENSITIVITY)
Troponin I (High Sensitivity): 10 ng/L (ref <18)
Troponin I (High Sensitivity): 10 ng/L (ref <18)

## 2020-06-10 LAB — CBC
HCT: 38.2 % — ABNORMAL LOW (ref 39.0–52.0)
Hemoglobin: 11.6 g/dL — ABNORMAL LOW (ref 13.0–17.0)
MCH: 28.9 pg (ref 26.0–34.0)
MCHC: 30.4 g/dL (ref 30.0–36.0)
MCV: 95 fL (ref 80.0–100.0)
Platelets: 209 10*3/uL (ref 150–400)
RBC: 4.02 MIL/uL — ABNORMAL LOW (ref 4.22–5.81)
RDW: 17.2 % — ABNORMAL HIGH (ref 11.5–15.5)
WBC: 11.9 10*3/uL — ABNORMAL HIGH (ref 4.0–10.5)
nRBC: 0 % (ref 0.0–0.2)

## 2020-06-10 LAB — SARS CORONAVIRUS 2 BY RT PCR (HOSPITAL ORDER, PERFORMED IN ~~LOC~~ HOSPITAL LAB): SARS Coronavirus 2: NEGATIVE

## 2020-06-10 LAB — URINALYSIS, ROUTINE W REFLEX MICROSCOPIC
Bacteria, UA: NONE SEEN
Bilirubin Urine: NEGATIVE
Glucose, UA: NEGATIVE mg/dL
Ketones, ur: 5 mg/dL — AB
Nitrite: NEGATIVE
Protein, ur: 100 mg/dL — AB
RBC / HPF: >50 RBC/hpf — ABNORMAL HIGH (ref 0–5)
Specific Gravity, Urine: 1.011 (ref 1.005–1.030)
WBC, UA: >50 WBC/hpf — ABNORMAL HIGH (ref 0–5)
pH: 9 — ABNORMAL HIGH (ref 5.0–8.0)

## 2020-06-10 LAB — D-DIMER, QUANTITATIVE: D-Dimer, Quant: 2.88 ug/mL-FEU — ABNORMAL HIGH (ref 0.00–0.50)

## 2020-06-10 LAB — BRAIN NATRIURETIC PEPTIDE: B Natriuretic Peptide: 53.3 pg/mL (ref 0.0–100.0)

## 2020-06-10 MED ORDER — SODIUM CHLORIDE 0.9 % IV SOLN
1.0000 g | Freq: Once | INTRAVENOUS | Status: AC
  Administered 2020-06-11: 1 g via INTRAVENOUS
  Filled 2020-06-10: qty 10

## 2020-06-10 MED ORDER — LORAZEPAM 1 MG PO TABS
1.0000 mg | ORAL_TABLET | Freq: Once | ORAL | Status: AC
  Administered 2020-06-10: 1 mg via ORAL
  Filled 2020-06-10: qty 1

## 2020-06-10 MED ORDER — OXYCODONE-ACETAMINOPHEN 5-325 MG PO TABS
1.0000 | ORAL_TABLET | Freq: Once | ORAL | Status: AC
  Administered 2020-06-10: 1 via ORAL
  Filled 2020-06-10: qty 1

## 2020-06-10 NOTE — ED Provider Notes (Signed)
[facility] EMERGENCY DEPARTMENT Provider Note   CSN: [id number] Arrival date & time: 06/10/20  1133     History Chief Complaint  Patient presents with  . Shortness of Breath  . Dizziness    Manuel Ortega is a 69 y.o. male past medical history of obstructive nephropathy, complicated by Foley placement, hematuria requiring blood transfusion before for depression and hypertension presents to the ED today with complaint of hematuria, dizziness, shortness of breath.  Patient states that he tolerated a full session of dialysis yesterday, has had no complaints since then until this morning he noted a substantial amount of blood in his Foley.  Endorses "big clots" and since then has been feeling progressively more dizzy, worse with standing or movement, endorses this is a sensation of feeling about to pass out.  Denies associated chest pain but does endorse shortness of breath with this.  The history is provided by the patient.  Shortness of Breath Severity:  Moderate Onset quality:  Gradual Duration:  10 hours Timing:  Constant Progression:  Worsening Chronicity:  New Context comment:  Hematuria dizziness Associated symptoms: no abdominal pain, no chest pain, no cough, no fever, no headaches, no rash and no vomiting   Dizziness Associated symptoms: shortness of breath   Associated symptoms: no chest pain, no headaches, no palpitations and no vomiting        Past Medical History:  Diagnosis Date  . AKI (acute kidney injury) ([place]) 05/2020  . Depression   . Glaucoma   . Hypertension     Patient Active Problem List   Diagnosis Date Noted  . Shortness of breath 06/10/2020  . Acute kidney injury ([place]) 05/11/2020  . Hypokalemia 05/11/2020  . Hypocalcemia 05/11/2020  . Hypomagnesemia 05/11/2020  . Nausea and vomiting 05/11/2020    Past Surgical History:  Procedure Laterality Date  . IR FLUORO GUIDE CV LINE RIGHT  05/12/2020  . IR FLUORO GUIDE CV LINE RIGHT   05/28/2020  . IR US GUIDE VASC ACCESS RIGHT  05/12/2020  . IR US GUIDE VASC ACCESS RIGHT  05/28/2020       Family History  Family history unknown: Yes    Social History   Tobacco Use  . Smoking status: Never Smoker  . Smokeless tobacco: Never Used  Vaping Use  . Vaping Use: Never used  Substance Use Topics  . Alcohol use: Never  . Drug use: Never    Home Medications Prior to Admission medications   Medication Sig Start Date End Date Taking? Authorizing Provider  divalproex (DEPAKOTE) 125 MG DR tablet Take 125 mg by mouth 2 (two) times daily. 12/19/19   [provider]  docusate sodium (COLACE) 100 MG capsule Take 1 capsule (100 mg total) by mouth 2 (two) times daily. 06/01/20   Pokhrel, Corrie Mckusick, MD  FLUoxetine (PROZAC) 40 MG capsule Take 1 capsule (40 mg total) by mouth daily. 06/02/20   Pokhrel, Laxman, MD  latanoprost (XALATAN) 0.005 % ophthalmic solution Place 1 drop into both eyes at bedtime.  12/19/19   [provider]  magnesium oxide (MAG-OX) 400 (241.3 Mg) MG tablet Take 1 tablet (400 mg total) by mouth daily. 06/02/20   Pokhrel, Laxman, MD  melatonin 5 MG TABS Take 1 tablet (5 mg total) by mouth at bedtime. 06/01/20   Pokhrel, Corrie Mckusick, MD  Nutritional Supplements (FEEDING SUPPLEMENT, NEPRO CARB STEADY,) LIQD Take 237 mLs by mouth 2 (two) times daily between meals. 06/01/20   Pokhrel, Corrie Mckusick, MD  QUEtiapine (SEROQUEL) 200  MG tablet Take 200 mg by mouth at bedtime.  11/14/19   [provider]  tamsulosin (FLOMAX) 0.4 MG CAPS capsule Take 2 capsules (0.8 mg total) by mouth daily after supper. 06/01/20   Pokhrel, Corrie Mckusick, MD    Allergies    Penicillins  Review of Systems   Review of Systems  Constitutional: Negative for chills and fever.  HENT: Negative for facial swelling and voice change.   Eyes: Negative for redness and visual disturbance.  Respiratory: Positive for shortness of breath. Negative for cough.   Cardiovascular: Negative for chest pain and  palpitations.  Gastrointestinal: Negative for abdominal pain and vomiting.  Genitourinary: Positive for hematuria and penile pain. Negative for difficulty urinating, dysuria, penile swelling, scrotal swelling and testicular pain.  Musculoskeletal: Negative for gait problem and joint swelling.  Skin: Negative for rash and wound.  Neurological: Positive for dizziness. Negative for headaches.  Psychiatric/Behavioral: Negative for confusion and suicidal ideas.    Physical Exam Updated Vital Signs BP (!) 157/91   Pulse 91   Temp 98.4 F (36.9 C) (Oral)   Resp 19   SpO2 99%   Physical Exam Constitutional:      General: He is not in acute distress.    Appearance: He is ill-appearing.     Comments: Chronically ill-appearing, requiring significant help to transfer from wheelchair to bed  HENT:     Head: Normocephalic and atraumatic.     Mouth/Throat:     Mouth: Mucous membranes are moist.     Pharynx: Oropharynx is clear.  Eyes:     General: No scleral icterus.    Pupils: Pupils are equal, round, and reactive to light.  Cardiovascular:     Rate and Rhythm: Regular rhythm. Tachycardia present.     Pulses: Normal pulses.  Pulmonary:     Effort: Tachypnea present. No respiratory distress.     Breath sounds: No rales.  Abdominal:     General: There is no distension.     Tenderness: There is no abdominal tenderness.  Genitourinary:    Comments: Chaperone present, no suprapubic fullness or significant tenderness, Foley in place otherwise normal male genitalia, mild penile tenderness.  Foley bag with bloodstained urine, approximately 300 cc which per patient has not been emptied yet today. Musculoskeletal:        General: No tenderness or deformity.     Cervical back: Normal range of motion and neck supple.  Neurological:     General: No focal deficit present.     Mental Status: He is alert and oriented to person, place, and time.     Cranial Nerves: No cranial nerve deficit.      Motor: No weakness.     Comments: No nystagmus  Psychiatric:        Mood and Affect: Mood normal.        Behavior: Behavior normal.     ED Results / Procedures / Treatments   Labs (all labs ordered are listed, but only abnormal results are displayed) Labs Reviewed  BASIC METABOLIC PANEL - Abnormal; Notable for the following components:      Result Value   Chloride 96 (*)    BUN 26 (*)    Creatinine, Ser 6.20 (*)    Calcium 8.1 (*)    GFR calc non Af Amer 8 (*)    GFR calc Af Amer 10 (*)    Anion gap 16 (*)    All other components within normal limits  CBC - Abnormal; Notable  for the following components:   WBC 11.9 (*)    RBC 4.02 (*)    Hemoglobin 11.6 (*)    HCT 38.2 (*)    RDW 17.2 (*)    All other components within normal limits  URINALYSIS, ROUTINE W REFLEX MICROSCOPIC - Abnormal; Notable for the following components:   Color, Urine AMBER (*)    APPearance CLOUDY (*)    pH 9.0 (*)    Hgb urine dipstick LARGE (*)    Ketones, ur 5 (*)    Protein, ur 100 (*)    Leukocytes,Ua LARGE (*)    RBC / HPF >50 (*)    WBC, UA >50 (*)    All other components within normal limits  D-DIMER, QUANTITATIVE (NOT AT [facility]) - Abnormal; Notable for the following components:   D-Dimer, Quant 2.88 (*)    All other components within normal limits  SARS CORONAVIRUS 2 BY RT PCR (HOSPITAL ORDER, [facility])  URINE CULTURE  BRAIN NATRIURETIC PEPTIDE  CBG MONITORING, ED  TROPONIN I (HIGH SENSITIVITY)  TROPONIN I (HIGH SENSITIVITY)    EKG EKG Interpretation  Date/Time:  Sunday June 10 2020 11:40:14 EDT Ventricular Rate:  99 PR Interval:  168 QRS Duration: 92 QT Interval:  398 QTC Calculation: 510 R Axis:   -42 Text Interpretation: Sinus rhythm with Premature supraventricular complexes Left axis deviation Prolonged QT Abnormal ECG Confirmed by Gerlene Fee [phone]) on 06/10/2020 6:05:59 PM   Radiology DG Chest Port 1 View  Result Date:  06/10/2020 CLINICAL DATA:  Dizziness and shortness of breath.  Dialysis today. EXAM: PORTABLE CHEST 1 VIEW COMPARISON:  None. FINDINGS: Right-sided dialysis catheter tip in the mid SVC. Heart is normal in size. Normal mediastinal contours. No pulmonary edema. No focal airspace disease, large pleural effusion or pneumothorax. No acute osseous abnormalities are seen. IMPRESSION: No acute chest findings. Electronically Signed   By: Keith Rake M.D.   On: 06/10/2020 18:18    Procedures Procedures (including critical care time)  Medications Ordered in ED Medications  cefTRIAXone (ROCEPHIN) 1 g in sodium chloride 0.9 % 100 mL IVPB (has no administration in time range)  oxyCODONE-acetaminophen (PERCOCET/ROXICET) 5-325 MG per tablet 1 tablet (1 tablet Oral Given 06/10/20 1834)    ED Course  I have reviewed the triage vital signs and the nursing notes.  Pertinent labs & imaging results that were available during my care of the patient were reviewed by me and considered in my medical decision making (see chart for details).    MDM Rules/Calculators/A&P                         EKG findings by my read: Compared to prior: 05/23/20.  Rate: 99 rhythm: sinus Axis: appropriate  PR: 160 QRS: 92 QTc: 510.  No evidence of ischemia or arrhythmia, nor any other pathologic findings concerning considering patient presentation. Findings discussed with attending who agrees.  Differential diagnosis considered: Blood loss anemia, arrhythmia, ACS, PE, electrolyte disturbance, volume overload, COVID-19, pneumonia, sepsis  Patient is presenting with postural dizziness and presyncope and associated shortness of breath, worsened with standing, this is in the setting of some degree of gross hematuria though a review of the patient's Foley bag this has been relatively low volume despite the patient's history of significant hematuria requiring transfusion.  He is mildly tachycardic and does appear to be in some degree of  respiratory distress when stood and requires extensive help with ambulation.  Broad infectious/encephalopathy work-up initiated given that the patient is hemodynamically stable PE remains in the differential given the patient's tachypnea shortness of breath being a major component of his symptoms while standing and he has been mildly tachycardic.  CBC relatively unremarkable, some leukocytosis of 52.8 of uncertain significance, hemoglobin improved from prior at 11.6.  Chemistries largely unremarkable consistent with ESRD status with no evident indicators for need for emergent dialysis.  D-dimer obtained and positive at 2.8, urine studies obtained from Foley catheter though not Foley bag given the patient's refusal for our catheter exchange which disintegrates understandable given his history of traumatic Foley placement with exsanguinating hemorrhage.  Urinalysis does appear suggestive of infection given large leukocytes, protein, WBCs.  Will antibiosis with Rocephin and add on culture.  Patient again trialed for ambulation, extremely uncomfortable becoming tachypneic again, stating that he is unable to tolerate getting around at this time.  Given positive D-dimer and profound symptoms, feel the patient would benefit from admission for VQ scan.  Risks and benefits of contrasted study discussed with the patient as well as admitting team, given that the patient is holding out hope for renal recovery and does still produce urine, feel that risks of contrasted CT PE study outweigh benefits for this patient.  Hospitalist agreed to admit.  Final Clinical Impression(s) / ED Diagnoses Final diagnoses:  Postural dizziness with presyncope  Shortness of breath    Rx / DC Orders ED Discharge Orders    None     Labs, studies and imaging reviewed by myself and considered in medical decision making if ordered. Imaging interpreted by radiology. Pt was discussed with my attending, Dr.  Sedonia Small  Electronically signed by:  Roderic Palau [place]8/29/202110:39 PM       Renold Genta, MD 06/10/20 2239    Maudie Flakes, MD 06/11/20 0001

## 2020-06-10 NOTE — ED Triage Notes (Signed)
EMS stated, he has dizziness and SOB , started today. Had dialysis yesterday. Marland Kitchen He has blood in his urine at the present.

## 2020-06-10 NOTE — ED Notes (Signed)
Admitting provider bedside 

## 2020-06-10 NOTE — ED Triage Notes (Signed)
Pt. Stated, when I stand I get very dizzy started 2 weeks ago when I was here.

## 2020-06-10 NOTE — ED Notes (Signed)
Patient c/o "dizziness". VSS. Patient appears anxious and agitated. Triage RN made aware of patient's complaint.

## 2020-06-11 ENCOUNTER — Other Ambulatory Visit: Payer: Self-pay

## 2020-06-11 ENCOUNTER — Encounter (HOSPITAL_COMMUNITY): Payer: Self-pay | Admitting: Internal Medicine

## 2020-06-11 DIAGNOSIS — N170 Acute kidney failure with tubular necrosis: Secondary | ICD-10-CM

## 2020-06-11 DIAGNOSIS — R7989 Other specified abnormal findings of blood chemistry: Secondary | ICD-10-CM | POA: Diagnosis not present

## 2020-06-11 DIAGNOSIS — N401 Enlarged prostate with lower urinary tract symptoms: Secondary | ICD-10-CM | POA: Diagnosis not present

## 2020-06-11 DIAGNOSIS — T83511A Infection and inflammatory reaction due to indwelling urethral catheter, initial encounter: Principal | ICD-10-CM

## 2020-06-11 DIAGNOSIS — F329 Major depressive disorder, single episode, unspecified: Secondary | ICD-10-CM | POA: Diagnosis present

## 2020-06-11 DIAGNOSIS — I10 Essential (primary) hypertension: Secondary | ICD-10-CM | POA: Diagnosis present

## 2020-06-11 DIAGNOSIS — N138 Other obstructive and reflux uropathy: Secondary | ICD-10-CM

## 2020-06-11 DIAGNOSIS — F32A Depression, unspecified: Secondary | ICD-10-CM | POA: Diagnosis present

## 2020-06-11 DIAGNOSIS — R9431 Abnormal electrocardiogram [ECG] [EKG]: Secondary | ICD-10-CM | POA: Diagnosis present

## 2020-06-11 DIAGNOSIS — R310 Gross hematuria: Secondary | ICD-10-CM

## 2020-06-11 DIAGNOSIS — N390 Urinary tract infection, site not specified: Secondary | ICD-10-CM

## 2020-06-11 LAB — COMPREHENSIVE METABOLIC PANEL
ALT: 12 U/L (ref 0–44)
AST: 15 U/L (ref 15–41)
Albumin: 3.1 g/dL — ABNORMAL LOW (ref 3.5–5.0)
Alkaline Phosphatase: 70 U/L (ref 38–126)
Anion gap: 15 (ref 5–15)
BUN: 34 mg/dL — ABNORMAL HIGH (ref 8–23)
CO2: 25 mmol/L (ref 22–32)
Calcium: 8 mg/dL — ABNORMAL LOW (ref 8.9–10.3)
Chloride: 98 mmol/L (ref 98–111)
Creatinine, Ser: 7.64 mg/dL — ABNORMAL HIGH (ref 0.61–1.24)
GFR calc Af Amer: 8 mL/min — ABNORMAL LOW (ref >60)
GFR calc non Af Amer: 7 mL/min — ABNORMAL LOW (ref >60)
Glucose, Bld: 115 mg/dL — ABNORMAL HIGH (ref 70–99)
Potassium: 3.9 mmol/L (ref 3.5–5.1)
Sodium: 138 mmol/L (ref 135–145)
Total Bilirubin: 0.5 mg/dL (ref 0.3–1.2)
Total Protein: 6.2 g/dL — ABNORMAL LOW (ref 6.5–8.1)

## 2020-06-11 LAB — CBC WITH DIFFERENTIAL/PLATELET
Abs Immature Granulocytes: 0.08 10*3/uL — ABNORMAL HIGH (ref 0.00–0.07)
Basophils Absolute: 0.1 10*3/uL (ref 0.0–0.1)
Basophils Relative: 1 %
Eosinophils Absolute: 0.2 10*3/uL (ref 0.0–0.5)
Eosinophils Relative: 2 %
HCT: 36.4 % — ABNORMAL LOW (ref 39.0–52.0)
Hemoglobin: 10.9 g/dL — ABNORMAL LOW (ref 13.0–17.0)
Immature Granulocytes: 1 %
Lymphocytes Relative: 16 %
Lymphs Abs: 1.9 10*3/uL (ref 0.7–4.0)
MCH: 28.2 pg (ref 26.0–34.0)
MCHC: 29.9 g/dL — ABNORMAL LOW (ref 30.0–36.0)
MCV: 94.3 fL (ref 80.0–100.0)
Monocytes Absolute: 0.7 10*3/uL (ref 0.1–1.0)
Monocytes Relative: 6 %
Neutro Abs: 8.8 10*3/uL — ABNORMAL HIGH (ref 1.7–7.7)
Neutrophils Relative %: 74 %
Platelets: 196 10*3/uL (ref 150–400)
RBC: 3.86 MIL/uL — ABNORMAL LOW (ref 4.22–5.81)
RDW: 17 % — ABNORMAL HIGH (ref 11.5–15.5)
WBC: 11.7 10*3/uL — ABNORMAL HIGH (ref 4.0–10.5)
nRBC: 0 % (ref 0.0–0.2)

## 2020-06-11 LAB — MAGNESIUM: Magnesium: 2.6 mg/dL — ABNORMAL HIGH (ref 1.7–2.4)

## 2020-06-11 LAB — PROTIME-INR
INR: 1 (ref 0.8–1.2)
Prothrombin Time: 12.9 seconds (ref 11.4–15.2)

## 2020-06-11 LAB — APTT: aPTT: 29 seconds (ref 24–36)

## 2020-06-11 MED ORDER — ONDANSETRON HCL 4 MG/2ML IJ SOLN: 4.0000 mg | Freq: Four times a day (QID) | INTRAMUSCULAR | Status: DC | PRN

## 2020-06-11 MED ORDER — CHLORHEXIDINE GLUCONATE CLOTH 2 % EX PADS
6.0000 | MEDICATED_PAD | Freq: Every day | CUTANEOUS | Status: DC
  Administered 2020-06-11 – 2020-06-13 (×3): 6 via TOPICAL

## 2020-06-11 MED ORDER — ZOLPIDEM TARTRATE 5 MG PO TABS
5.0000 mg | ORAL_TABLET | Freq: Every evening | ORAL | Status: DC | PRN
  Administered 2020-06-11 – 2020-06-12 (×3): 5 mg via ORAL
  Filled 2020-06-11 (×3): qty 1

## 2020-06-11 MED ORDER — NEPRO/CARBSTEADY PO LIQD
237.0000 mL | Freq: Two times a day (BID) | ORAL | Status: DC
  Administered 2020-06-11 – 2020-06-13 (×5): 237 mL via ORAL
  Filled 2020-06-11 (×6): qty 237

## 2020-06-11 MED ORDER — ACETAMINOPHEN 650 MG RE SUPP: 650.0000 mg | Freq: Four times a day (QID) | RECTAL | Status: DC | PRN

## 2020-06-11 MED ORDER — TIMOLOL MALEATE 0.5 % OP SOLN
1.0000 [drp] | Freq: Two times a day (BID) | OPHTHALMIC | Status: DC
  Administered 2020-06-11 – 2020-06-13 (×6): 1 [drp] via OPHTHALMIC
  Filled 2020-06-11: qty 5

## 2020-06-11 MED ORDER — ONDANSETRON HCL 4 MG PO TABS: 4.0000 mg | ORAL_TABLET | Freq: Four times a day (QID) | ORAL | Status: DC | PRN

## 2020-06-11 MED ORDER — QUETIAPINE FUMARATE 200 MG PO TABS
200.0000 mg | ORAL_TABLET | Freq: Every day | ORAL | Status: DC
  Filled 2020-06-11: qty 1

## 2020-06-11 MED ORDER — POLYETHYLENE GLYCOL 3350 17 G PO PACK
17.0000 g | PACK | Freq: Every day | ORAL | Status: DC | PRN
  Administered 2020-06-11: 17 g via ORAL
  Filled 2020-06-11: qty 1

## 2020-06-11 MED ORDER — DOCUSATE SODIUM 100 MG PO CAPS
100.0000 mg | ORAL_CAPSULE | Freq: Two times a day (BID) | ORAL | Status: DC
  Administered 2020-06-11 – 2020-06-13 (×5): 100 mg via ORAL
  Filled 2020-06-11 (×4): qty 1

## 2020-06-11 MED ORDER — MELATONIN 5 MG PO TABS
5.0000 mg | ORAL_TABLET | Freq: Every day | ORAL | Status: DC
  Administered 2020-06-11 – 2020-06-12 (×3): 5 mg via ORAL
  Filled 2020-06-11 (×4): qty 1

## 2020-06-11 MED ORDER — LATANOPROST 0.005 % OP SOLN
1.0000 [drp] | Freq: Every day | OPHTHALMIC | Status: DC
  Administered 2020-06-11 – 2020-06-12 (×3): 1 [drp] via OPHTHALMIC
  Filled 2020-06-11: qty 2.5

## 2020-06-11 MED ORDER — FLUOXETINE HCL 20 MG PO CAPS
40.0000 mg | ORAL_CAPSULE | Freq: Every day | ORAL | Status: DC
  Administered 2020-06-11 – 2020-06-13 (×3): 40 mg via ORAL
  Filled 2020-06-11 (×3): qty 2

## 2020-06-11 MED ORDER — SODIUM CHLORIDE 0.9 % IV SOLN
1.0000 g | INTRAVENOUS | Status: DC
  Administered 2020-06-11: 1 g via INTRAVENOUS
  Filled 2020-06-11 (×3): qty 10

## 2020-06-11 MED ORDER — DIVALPROEX SODIUM 125 MG PO DR TAB
125.0000 mg | DELAYED_RELEASE_TABLET | Freq: Two times a day (BID) | ORAL | Status: DC
  Administered 2020-06-11 – 2020-06-13 (×6): 125 mg via ORAL
  Filled 2020-06-11 (×7): qty 1

## 2020-06-11 MED ORDER — TAMSULOSIN HCL 0.4 MG PO CAPS
0.8000 mg | ORAL_CAPSULE | Freq: Every day | ORAL | Status: DC
  Administered 2020-06-11 – 2020-06-12 (×2): 0.8 mg via ORAL
  Filled 2020-06-11 (×2): qty 2

## 2020-06-11 MED ORDER — ACETAMINOPHEN 325 MG PO TABS: 650.0000 mg | ORAL_TABLET | Freq: Four times a day (QID) | ORAL | Status: DC | PRN

## 2020-06-11 NOTE — ED Notes (Addendum)
Pt had multiple attempts by RN, previous RN and phlebotomy staff to draw blood culture without success, this RN placed IV team result for difficulty start with labs. Per IV team doesn't draw cultures or start lines for labs draws. Provider to be made aware of status of blood cultures at this time, antibiotics started by previous RN Anderson Malta, RN as to not delay pt care.

## 2020-06-11 NOTE — Progress Notes (Signed)
CSW notes patient arrived to the hospital from [facility]. Per [facility], he is able to return at discharge. CSW will continue to follow.   Keller Bounds LCSW

## 2020-06-11 NOTE — ED Notes (Signed)
Bladder scan resulted 37mls

## 2020-06-11 NOTE — Consult Note (Signed)
I have been asked to see the patient by Dr. Inda Merlin, for evaluation and management of gross hematuria.    History of present illness: 69 year old man with recent hospitalization 05/11/20-06/01/20 with weakness and nausea found to have acute renal failure with creatinine over 17.  CT at that time revealed very distended bladder with bilateral hydronephrosis.  Foley catheter was placed and he was started on dialysis.  He then developed gross hematuria and required blood transfusion.  He was placed on a short course of continuous bladder irrigation and discharged home with a Foley catheter.  Patient presented to ER yesterday with worsening weakness and fatigue.  Urology was then called for gross hematuria associated with clots. Patient is feeling stronger when seen and examined at the bedside today.  His urine is clear and yellow in the tubing without clots.   Review of systems: A 12 point comprehensive review of systems was obtained and is negative unless otherwise stated in the history of present illness.  Patient Active Problem List   Diagnosis Date Noted  . Essential hypertension 06/11/2020  . Acute kidney injury (AKI) with acute tubular necrosis (ATN) ([place]) 06/11/2020  . Gross hematuria 06/11/2020  . Major depressive disorder 06/11/2020  . D-dimer, elevated 06/11/2020  . 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 Prolonged QT interval 06/11/2020  . Urinary tract infection associated with catheterization of urinary tract, initial encounter ([place]) 06/10/2020  . Acute kidney injury ([place]) 05/11/2020  . Hypokalemia 05/11/2020  . Hypocalcemia 05/11/2020  . Hypomagnesemia 05/11/2020  . Nausea and vomiting 05/11/2020    No current facility-administered medications on file prior to encounter.   Current Outpatient Medications on File Prior to Encounter  Medication Sig Dispense Refill  . divalproex (DEPAKOTE) 125 MG DR tablet Take 125 mg by mouth 2 (two) times daily.     Marland Kitchen docusate sodium (COLACE) 100 MG capsule Take 1 capsule (100 mg total) by mouth 2 (two) times daily.    Marland Kitchen FLUoxetine (PROZAC) 40 MG capsule Take 1 capsule (40 mg total) by mouth daily.    Marland Kitchen latanoprost (XALATAN) 0.005 % ophthalmic solution Place 1 drop into both eyes at bedtime.     . magnesium oxide (MAG-OX) 400 (241.3 Mg) MG tablet Take 1 tablet (400 mg total) by mouth daily. 30 tablet   . melatonin 5 MG TABS Take 1 tablet (5 mg total) by mouth at bedtime. 30 tablet 0  . Nutritional Supplements (FEEDING SUPPLEMENT, NEPRO CARB STEADY,) LIQD Take 237 mLs by mouth 2 (two) times daily between meals.  0  . QUEtiapine (SEROQUEL) 200 MG tablet Take 200 mg by mouth at bedtime.     . tamsulosin (FLOMAX) 0.4 MG CAPS capsule Take 2 capsules (0.8 mg total) by mouth daily after supper.    . zolpidem (AMBIEN) 5 MG tablet Take 5 mg by mouth at bedtime as needed for sleep.    Marland Kitchen timolol (TIMOPTIC) 0.5 % ophthalmic solution Place 1 drop into both eyes in the morning and at bedtime.      Past Medical History:  Diagnosis Date  . AKI (acute kidney injury) ([place]) 05/2020  . Depression   . Glaucoma   . Hypertension     Past Surgical History:  Procedure Laterality Date  . IR FLUORO GUIDE CV LINE RIGHT  05/12/2020  . IR FLUORO GUIDE CV LINE RIGHT  05/28/2020  . IR US GUIDE VASC ACCESS RIGHT  05/12/2020  . IR US GUIDE VASC ACCESS RIGHT  05/28/2020  Social History   Tobacco Use  . Smoking status: Never Smoker  . Smokeless tobacco: Never Used  Vaping Use  . Vaping Use: Never used  Substance Use Topics  . Alcohol use: Never  . Drug use: Never    Family History  Problem Relation Age of Onset  . Stroke Mother     PE: Vitals:   06/11/20 1045 06/11/20 1100 06/11/20 1115 06/11/20 1130  BP: 108/63 120/73 120/81 101/86  Pulse: 81 82 80 80  Resp: (!) 21 15 18 17   Temp:      TempSrc:      SpO2: 93% 98% 99% 100%   Patient appears to be in no acute distress  patient is alert and oriented  x3 Atraumatic normocephalic head No cervical or supraclavicular lymphadenopathy appreciated No increased work of breathing, no audible wheezes/rhonchi Regular sinus rhythm/rate Abdomen is soft,  nondistended GU foley draining clear yellow urine without clot Lower extremities are symmetric  Grossly neurologically intact No identifiable skin lesions  Recent Labs    06/10/20 1156 06/11/20 0837  WBC 11.9* 11.7*  HGB 11.6* 10.9*  HCT 38.2* 36.4*   Recent Labs    06/10/20 1156 06/11/20 0837  NA 137 138  K 3.7 3.9  CL 96* 98  CO2 25 25  GLUCOSE 94 115*  BUN 26* 34*  CREATININE 6.20* 7.64*  CALCIUM 8.1* 8.0*   Recent Labs    06/11/20 0837  INR 1.0   No results for input(s): LABURIN in the last 72 hours. Results for orders placed or performed during the hospital encounter of 06/10/20  SARS Coronavirus 2 by RT PCR (hospital order, performed in [facility] hospital lab) Nasopharyngeal Nasopharyngeal Swab     Status: None   Collection Time: 06/10/20  6:32 PM   Specimen: Nasopharyngeal Swab  Result Value Ref Range Status   SARS Coronavirus 2 NEGATIVE NEGATIVE Final    Comment: (NOTE) SARS-CoV-2 target nucleic acids are NOT DETECTED.  The SARS-CoV-2 RNA is generally detectable in upper and lower respiratory specimens during the acute phase of infection. The lowest concentration of SARS-CoV-2 viral copies this assay can detect is 250 copies / mL. A negative result does not preclude SARS-CoV-2 infection and should not be used as the sole basis for treatment or other patient management decisions.  A negative result may occur with improper specimen collection / handling, submission of specimen other than nasopharyngeal swab, presence of viral mutation(s) within the areas targeted by this assay, and inadequate number of viral copies (<250 copies / mL). A negative result must be combined with clinical observations, patient history, and epidemiological information.  Fact Sheet  for Patients:   StrictlyIdeas.no  Fact Sheet for Healthcare Providers: BankingDealers.co.za  This test is not yet approved or  cleared by the [place] FDA and has been authorized for detection and/or diagnosis of SARS-CoV-2 by FDA under an Emergency Use Authorization (EUA).  This EUA will remain in effect (meaning this test can be used) for the duration of the COVID-19 declaration under Section 564(b)(1) of the Act, 21 U.S.C. section 360bbb-3(b)(1), unless the authorization is terminated or revoked sooner.  Performed at [facility], [place] [address] [place], [place] [address]     Imaging: No recent imaging from this hospital admission   Imp: 69 year old man with acute urinary retention and AKI secondary to obstructive uropathy who was readmitted for overall weakness.  Urology reconsulted for gross hematuria which has since subsided.  Hemoglobin remained stable.  Recommendations: -Continue  Foley catheter until patient's follow-up at [facility] which is scheduled for 06/27/2020 with Dr. Alinda Money. -Patient has a hematuria Foley catheter in place which can be hand irrigated if hematuria with clots recurs -No further urologic recommendations at this time  Thank you for involving me in this patient's care.  Please page with any further questions or concerns. Genese Quebedeaux D Gunner Iodice

## 2020-06-11 NOTE — Progress Notes (Signed)
Consult placed for 2nd IV due to need for blood cultures. RN notified that IV Team does not place IVs for blood work. Pt has adequate access for med needs.   Randol Kern, RN VAST

## 2020-06-11 NOTE — ED Notes (Signed)
Pt's foley was irrigated per admitting provider's request.  Urine does not appear bloody, yellow w/ sediment.  Pt is comfortable, foley is appears to be draining appropriately.

## 2020-06-11 NOTE — NC FL2 (Signed)
[place] LEVEL OF CARE SCREENING TOOL     IDENTIFICATION  Patient Name: Manuel Ortega Birthdate: 20-Dec-1950 Sex: male Admission Date (Current Location): 06/10/2020  [facility] [place] Number:  Herbalist and Address:  [facility], [place] [address] [place], [place] [address]      Provider Number: [id number]  Attending Physician Name and Address:  Vernelle Emerald, MD  Relative Name and Phone Number:  Braulio Conte [phone]    Current Level of Care: Hospital Recommended Level of Care: [place] Prior Approval Number:    Date Approved/Denied:   PASRR Number: [id number] E Ends 06/29/20  Discharge Plan: SNF    Current Diagnoses: Patient Active Problem List   Diagnosis Date Noted  . Essential hypertension 06/11/2020  . Acute kidney injury (AKI) with acute tubular necrosis (ATN) ([place]) 06/11/2020  . Gross hematuria 06/11/2020  . Major depressive disorder 06/11/2020  . D-dimer, elevated 06/11/2020  . 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 Prolonged QT interval 06/11/2020  . Urinary tract infection associated with catheterization of urinary tract, initial encounter ([place]) 06/10/2020  . Acute kidney injury ([place]) 05/11/2020  . Hypokalemia 05/11/2020  . Hypocalcemia 05/11/2020  . Hypomagnesemia 05/11/2020  . Nausea and vomiting 05/11/2020    Orientation RESPIRATION BLADDER Height & Weight     Self, Time, Situation, Place  Normal Continent, Indwelling catheter Weight:   Height:     BEHAVIORAL SYMPTOMS/MOOD NEUROLOGICAL BOWEL NUTRITION STATUS      Continent Diet (Please see DC Summary)  AMBULATORY STATUS COMMUNICATION OF NEEDS Skin   Limited Assist Verbally Normal                       Personal Care Assistance Level of Assistance  Bathing, Feeding, Dressing Bathing Assistance: Limited assistance Feeding assistance: Independent Dressing Assistance: Limited assistance      Functional Limitations Info  Sight, Hearing, Speech Sight Info: Impaired Hearing Info: Adequate Speech Info: Adequate    SPECIAL CARE FACTORS FREQUENCY  PT (By licensed PT), OT (By licensed OT)     PT Frequency: 5x/week OT Frequency: 5x/week            Contractures Contractures Info: Not present    Additional Factors Info  Code Status, Allergies, Psychotropic Code Status Info: Full Allergies Info: Penicillins Psychotropic Info: Depakote; Prozac         Current Medications (06/11/2020):  This is the current hospital active medication list Current Facility-Administered Medications  Medication Dose Route Frequency Provider Last Rate Last Admin  . acetaminophen (TYLENOL) tablet 650 mg  650 mg Oral Q6H PRN Shalhoub, Sherryll Burger, MD       Or  . acetaminophen (TYLENOL) suppository 650 mg  650 mg Rectal Q6H PRN Shalhoub, Sherryll Burger, MD      . cefTRIAXone (ROCEPHIN) 1 g in sodium chloride 0.9 % 100 mL IVPB  1 g Intravenous Q24H Shalhoub, Sherryll Burger, MD      . Chlorhexidine Gluconate Cloth 2 % PADS 6 each  6 each Topical Daily Shalhoub, Sherryll Burger, MD      . divalproex (DEPAKOTE) DR tablet 125 mg  125 mg Oral BID Vernelle Emerald, MD   125 mg at 06/11/20 1004  . docusate sodium (COLACE) capsule 100 mg  100 mg Oral BID Vernelle Emerald, MD   100 mg at 06/11/20 1009  . feeding supplement (NEPRO CARB STEADY) liquid 237 mL  237 mL Oral  BID BM Vernelle Emerald, MD   237 mL at 06/11/20 1352  . FLUoxetine (PROZAC) capsule 40 mg  40 mg Oral Daily Shalhoub, Sherryll Burger, MD   40 mg at 06/11/20 1004  . latanoprost (XALATAN) 0.005 % ophthalmic solution 1 drop  1 drop Both Eyes QHS Shalhoub, Sherryll Burger, MD   1 drop at 06/11/20 0157  . melatonin tablet 5 mg  5 mg Oral QHS Shalhoub, Sherryll Burger, MD   5 mg at 06/11/20 0157  . ondansetron (ZOFRAN) tablet 4 mg  4 mg Oral Q6H PRN Shalhoub, Sherryll Burger, MD       Or  . ondansetron [facility]) injection 4 mg  4 mg Intravenous Q6H PRN Shalhoub, Sherryll Burger, MD      .  polyethylene glycol (MIRALAX / GLYCOLAX) packet 17 g  17 g Oral Daily PRN Vernelle Emerald, MD   17 g at 06/11/20 1058  . tamsulosin (FLOMAX) capsule 0.8 mg  0.8 mg Oral QPC supper Shalhoub, Sherryll Burger, MD      . timolol (TIMOPTIC) 0.5 % ophthalmic solution 1 drop  1 drop Both Eyes BID Shalhoub, Sherryll Burger, MD   1 drop at 06/11/20 1005  . zolpidem (AMBIEN) tablet 5 mg  5 mg Oral QHS PRN Vernelle Emerald, MD   5 mg at 06/11/20 0209     Discharge Medications: Please see discharge summary for a list of discharge medications.  Relevant Imaging Results:  Relevant Lab Results:   Additional Information ssn [phone], needs transportation to dialysis [facility]  T, TH, Sat at 11:25.  Benard Halsted, LCSW

## 2020-06-11 NOTE — Consult Note (Signed)
[facility] Renal Consultation Note  Indication for Consultation:  Management of ESRD/hemodialysis; anemia, hypertension/volume and secondary hyperparathyroidism  HPI: Manuel Ortega is a 69 y.o. male  with recent AKI (requiring hemodialysis [facility] ) with question of-obstructive uropathy 2/2 BPH, now with foley cath and urology =Dr. Alinda Money following. Scr peaked at 9.02,  Cr 7.32 on 06/07/20. No known hx of underlying CKD. Multiple electrolyte abnormalities on admission, history of hypokalemia  on 3K bath.  He attends dialysis without problems living in rehab center for weakness and rehab.  Now admitted with urinary tract infection.  Michela Pitcher he woke him from sleep on Sunday with generalized weakness malaise his Foley catheter showed bloody discharge previous 24 hours was clear.  He denies any fevers chills shortness of breath chest pain abdominal pain.  We are consulted for HD /AKI management.  K3.9, BUN 34, creatinine 7.64 Hgb 10.9 WBC 11.7, chest x-ray no acute findings      Past Medical History:  Diagnosis Date  . AKI (acute kidney injury) ([facility]) 05/2020  . Depression   . Glaucoma   . Hypertension     Past Surgical History:  Procedure Laterality Date  . IR FLUORO GUIDE CV LINE RIGHT  05/12/2020  . IR FLUORO GUIDE CV LINE RIGHT  05/28/2020  . IR US GUIDE VASC ACCESS RIGHT  05/12/2020  . IR US GUIDE VASC ACCESS RIGHT  05/28/2020      Family History  Problem Relation Age of Onset  . Stroke Mother       reports that he has never smoked. He has never used smokeless tobacco. He reports that he does not drink alcohol and does not use drugs.   Allergies  Allergen Reactions  . Penicillins     Prior to Admission medications   Medication Sig Start Date End Date Taking? Authorizing Provider  divalproex (DEPAKOTE) 125 MG DR tablet Take 125 mg by mouth 2 (two) times daily. 12/19/19  Yes [provider]  docusate sodium (COLACE) 100 MG capsule Take 1 capsule (100  mg total) by mouth 2 (two) times daily. 06/01/20  Yes Pokhrel, Laxman, MD  FLUoxetine (PROZAC) 40 MG capsule Take 1 capsule (40 mg total) by mouth daily. 06/02/20  Yes Pokhrel, Laxman, MD  latanoprost (XALATAN) 0.005 % ophthalmic solution Place 1 drop into both eyes at bedtime.  12/19/19  Yes [provider]  magnesium oxide (MAG-OX) 400 (241.3 Mg) MG tablet Take 1 tablet (400 mg total) by mouth daily. 06/02/20  Yes Pokhrel, Laxman, MD  melatonin 5 MG TABS Take 1 tablet (5 mg total) by mouth at bedtime. 06/01/20  Yes Pokhrel, Laxman, MD  Nutritional Supplements (FEEDING SUPPLEMENT, NEPRO CARB STEADY,) LIQD Take 237 mLs by mouth 2 (two) times daily between meals. 06/01/20  Yes Pokhrel, Laxman, MD  QUEtiapine (SEROQUEL) 200 MG tablet Take 200 mg by mouth at bedtime.  11/14/19  Yes [provider]  tamsulosin (FLOMAX) 0.4 MG CAPS capsule Take 2 capsules (0.8 mg total) by mouth daily after supper. 06/01/20  Yes Pokhrel, Laxman, MD  zolpidem (AMBIEN) 5 MG tablet Take 5 mg by mouth at bedtime as needed for sleep.   Yes [provider]  timolol (TIMOPTIC) 0.5 % ophthalmic solution Place 1 drop into both eyes in the morning and at bedtime. 07/14/19   [provider]     Anti-infectives (From admission, onward)   Start     Dose/Rate Route Frequency Ordered Stop   06/11/20 2200  cefTRIAXone (ROCEPHIN) 1  g in sodium chloride 0.9 % 100 mL IVPB        1 g 200 mL/hr over 30 Minutes Intravenous Every 24 hours 06/11/20 0015 06/17/20 2159   06/10/20 2215  cefTRIAXone (ROCEPHIN) 1 g in sodium chloride 0.9 % 100 mL IVPB        1 g 200 mL/hr over 30 Minutes Intravenous  Once 06/10/20 2214 06/11/20 0048      Results for orders placed or performed during the hospital encounter of 06/10/20 (from the past 48 hour(s))  Basic metabolic panel     Status: Abnormal   Collection Time: 06/10/20 11:56 AM  Result Value Ref Range   Sodium 137 135 - 145 mmol/L   Potassium 3.7 3.5 - 5.1 mmol/L    Chloride 96 (L) 98 - 111 mmol/L   CO2 25 22 - 32 mmol/L   Glucose, Bld 94 70 - 99 mg/dL    Comment: Glucose reference range applies only to samples taken after fasting for at least 8 hours.   BUN 26 (H) 8 - 23 mg/dL   Creatinine, Ser 6.20 (H) 0.61 - 1.24 mg/dL   Calcium 8.1 (L) 8.9 - 10.3 mg/dL   GFR calc non Af Amer 8 (L) >60 mL/min   GFR calc Af Amer 10 (L) >60 mL/min   Anion gap 16 (H) 5 - 15    Comment: Performed at [facility] [address], [place], [place] [address]  CBC     Status: Abnormal   Collection Time: 06/10/20 11:56 AM  Result Value Ref Range   WBC 11.9 (H) 4.0 - 10.5 K/uL   RBC 4.02 (L) 4.22 - 5.81 MIL/uL   Hemoglobin 11.6 (L) 13.0 - 17.0 g/dL   HCT 38.2 (L) 39 - 52 %   MCV 95.0 80.0 - 100.0 fL   MCH 28.9 26.0 - 34.0 pg   MCHC 30.4 30.0 - 36.0 g/dL   RDW 17.2 (H) 11.5 - 15.5 %   Platelets 209 150 - 400 K/uL   nRBC 0.0 0.0 - 0.2 %    Comment: Performed at [facility] [address], [place], [place] [address]  D-dimer, quantitative (not at [facility])     Status: Abnormal   Collection Time: 06/10/20  6:25 PM  Result Value Ref Range   D-Dimer, Quant 2.88 (H) 0.00 - 0.50 ug/mL-FEU    Comment: (NOTE) At the manufacturer cut-off of 0.50 ug/mL FEU, this assay has been documented to exclude PE with a sensitivity and negative predictive value of 97 to 99%.  At this time, this assay has not been approved by the FDA to exclude DVT/VTE. Results should be correlated with clinical presentation. Performed at [facility], [place] [address] [place], [place] [address]   Brain natriuretic peptide     Status: None   Collection Time: 06/10/20  6:25 PM  Result Value Ref Range   B Natriuretic Peptide 53.3 0.0 - 100.0 pg/mL    Comment: Performed at [facility] [address], [place], [place] [address]  Troponin I (High Sensitivity)     Status: None   Collection Time: 06/10/20  6:25 PM  Result Value Ref Range   Troponin I (High Sensitivity) 10 <18 ng/L     Comment: (NOTE) Elevated high sensitivity troponin I (hsTnI) values and significant  changes across serial measurements may suggest ACS but many other  chronic and acute conditions are known to elevate hsTnI results.  Refer to the "  Links" section for chest pain algorithms and additional  guidance. Performed at [facility], [place] [address] [place], [place] [address]   SARS Coronavirus 2 by RT PCR (hospital order, performed in [facility] hospital lab) Nasopharyngeal Nasopharyngeal Swab     Status: None   Collection Time: 06/10/20  6:32 PM   Specimen: Nasopharyngeal Swab  Result Value Ref Range   SARS Coronavirus 2 NEGATIVE NEGATIVE    Comment: (NOTE) SARS-CoV-2 target nucleic acids are NOT DETECTED.  The SARS-CoV-2 RNA is generally detectable in upper and lower respiratory specimens during the acute phase of infection. The lowest concentration of SARS-CoV-2 viral copies this assay can detect is 250 copies / mL. A negative result does not preclude SARS-CoV-2 infection and should not be used as the sole basis for treatment or other patient management decisions.  A negative result may occur with improper specimen collection / handling, submission of specimen other than nasopharyngeal swab, presence of viral mutation(s) within the areas targeted by this assay, and inadequate number of viral copies (<250 copies / mL). A negative result must be combined with clinical observations, patient history, and epidemiological information.  Fact Sheet for Patients:   StrictlyIdeas.no  Fact Sheet for Healthcare Providers: BankingDealers.co.za  This test is not yet approved or  cleared by the [place] FDA and has been authorized for detection and/or diagnosis of SARS-CoV-2 by FDA under an Emergency Use Authorization (EUA).  This EUA will remain in effect (meaning this test can be used) for the duration of the COVID-19 declaration under  Section 564(b)(1) of the Act, 21 U.S.C. section 360bbb-3(b)(1), unless the authorization is terminated or revoked sooner.  Performed at [facility], [place] [address] [place], [place] [address]   Urinalysis, Routine w reflex microscopic Urine, Bag (ped)     Status: Abnormal   Collection Time: 06/10/20  9:43 PM  Result Value Ref Range   Color, Urine AMBER (A) YELLOW    Comment: BIOCHEMICALS MAY BE AFFECTED BY COLOR   APPearance CLOUDY (A) CLEAR   Specific Gravity, Urine 1.011 1.005 - 1.030   pH 9.0 (H) 5.0 - 8.0   Glucose, UA NEGATIVE NEGATIVE mg/dL   Hgb urine dipstick LARGE (A) NEGATIVE   Bilirubin Urine NEGATIVE NEGATIVE   Ketones, ur 5 (A) NEGATIVE mg/dL   Protein, ur 100 (A) NEGATIVE mg/dL   Nitrite NEGATIVE NEGATIVE   Leukocytes,Ua LARGE (A) NEGATIVE   RBC / HPF >50 (H) 0 - 5 RBC/hpf   WBC, UA >50 (H) 0 - 5 WBC/hpf   Bacteria, UA NONE SEEN NONE SEEN   Mucus PRESENT     Comment: Performed at [facility], [address] [place], [place] [address]  Troponin I (High Sensitivity)     Status: None   Collection Time: 06/10/20 10:20 PM  Result Value Ref Range   Troponin I (High Sensitivity) 10 <18 ng/L    Comment: (NOTE) Elevated high sensitivity troponin I (hsTnI) values and significant  changes across serial measurements may suggest ACS but many other  chronic and acute conditions are known to elevate hsTnI results.  Refer to the "Links" section for chest pain algorithms and additional  guidance. Performed at [facility], [place] [address] [place], [place] [address]   Magnesium     Status: Abnormal   Collection Time: 06/11/20  8:37 AM  Result Value Ref Range   Magnesium 2.6 (H) 1.7 - 2.4 mg/dL    Comment: Performed at [facility], [place] [address], [place], [place] [address]  CBC WITH DIFFERENTIAL     Status: Abnormal   Collection Time: 06/11/20  8:37 AM  Result Value Ref Range   WBC 11.7 (H) 4.0 - 10.5 K/uL   RBC 3.86 (L) 4.22 - 5.81 MIL/uL    Hemoglobin 10.9 (L) 13.0 - 17.0 g/dL   HCT 36.4 (L) 39 - 52 %   MCV 94.3 80.0 - 100.0 fL   MCH 28.2 26.0 - 34.0 pg   MCHC 29.9 (L) 30.0 - 36.0 g/dL   RDW 17.0 (H) 11.5 - 15.5 %   Platelets 196 150 - 400 K/uL   nRBC 0.0 0.0 - 0.2 %   Neutrophils Relative % 74 %   Neutro Abs 8.8 (H) 1.7 - 7.7 K/uL   Lymphocytes Relative 16 %   Lymphs Abs 1.9 0.7 - 4.0 K/uL   Monocytes Relative 6 %   Monocytes Absolute 0.7 0 - 1 K/uL   Eosinophils Relative 2 %   Eosinophils Absolute 0.2 0 - 0 K/uL   Basophils Relative 1 %   Basophils Absolute 0.1 0 - 0 K/uL   Immature Granulocytes 1 %   Abs Immature Granulocytes 0.08 (H) 0.00 - 0.07 K/uL    Comment: Performed at [facility], [address] [place], [place] [address]  Comprehensive metabolic panel     Status: Abnormal   Collection Time: 06/11/20  8:37 AM  Result Value Ref Range   Sodium 138 135 - 145 mmol/L   Potassium 3.9 3.5 - 5.1 mmol/L   Chloride 98 98 - 111 mmol/L   CO2 25 22 - 32 mmol/L   Glucose, Bld 115 (H) 70 - 99 mg/dL    Comment: Glucose reference range applies only to samples taken after fasting for at least 8 hours.   BUN 34 (H) 8 - 23 mg/dL   Creatinine, Ser 7.64 (H) 0.61 - 1.24 mg/dL   Calcium 8.0 (L) 8.9 - 10.3 mg/dL   Total Protein 6.2 (L) 6.5 - 8.1 g/dL   Albumin 3.1 (L) 3.5 - 5.0 g/dL   AST 15 15 - 41 U/L   ALT 12 0 - 44 U/L   Alkaline Phosphatase 70 38 - 126 U/L   Total Bilirubin 0.5 0.3 - 1.2 mg/dL   GFR calc non Af Amer 7 (L) >60 mL/min   GFR calc Af Amer 8 (L) >60 mL/min   Anion gap 15 5 - 15    Comment: Performed at [facility] [address] [place], [place] [address]  Protime-INR     Status: None   Collection Time: 06/11/20  8:37 AM  Result Value Ref Range   Prothrombin Time 12.9 11.4 - 15.2 seconds   INR 1.0 0.8 - 1.2    Comment: (NOTE) INR goal varies based on device and disease states. Performed at [facility], [place] [address], [place], [place] [address]   APTT     Status: None    Collection Time: 06/11/20  8:37 AM  Result Value Ref Range   aPTT 29 24 - 36 seconds    Comment: Performed at [facility] [address], [place], [place] [address]     ROS: See HPI  Physical Exam: Vitals:   06/11/20 1045 06/11/20 1100  BP: 108/63 120/73  Pulse: 81 82  Resp: (!) 21 15  Temp:    SpO2: 93% 98%     General: Alert pleasant well-developed  well-nourished male NAD HEENT: See, PERRLA, EOMI, nonicteric, membranes moist Neck: No JVD, supple Heart: RRR, no MRG Lungs: CTA nonlabored breathing room air oxygen Abdomen: Bowel sounds normoactive, ND, NT no HSM  Extremities: No pedal edema Skin: Warm dry no acute rash or wounds appreciated Neuro: Alert oriented x3, appropriate no acute focal deficits appreciated Dialysis Access: IJ PermCath dressing dry and clear nontender  Dialysis Orders: Center: [facility] Center on TTS 3 hours 45 minutes 99.0 kg  last 3 treatments post weight 97.5 and 97.7 and tolerated, 3K, 2.5 calcium bath, Access right IJ PermCath, Heparin 2000 units, Venofer 50 mg q. weekly, Mircera 150 MCG given every every 2 weeks last given 06/02/2020   Assessment/Plan 1. AKI(2/2 struct of uropathy BPH indwelling catheter/currently on hemodialysis TTS= next dialysis tomorrow on schedule, monitor I's and O's and labs 2. Urinary tract infection with generalized weakness dizziness= blood cultures urine culture sent IV antibiotics per admit team urology be consulted during Foley catheter exchange per admit team 3. Hypertension/volume  -BP stable not requiring meds, no excess volume 4. Anemia  -Hgb 10.9, last Mircera 150 given 8/21, follow-up hemoglobin trend hold weekly iron with infection 5. Metabolic bone disease -calcium corrected 8.7, phosphorus 2.4, no outpatient phosphate binder or  vitamin D 6. Nutrition -Albumin 3.1 renal diet renal vitamin 7. History of depression on Seroquel/Prozac and Prozac= meds per admitting  Ernest Haber, PA-C [place] [phone] 06/11/2020, 11:40 AM

## 2020-06-11 NOTE — H&P (Addendum)
History and Physical    Manuel Ortega MWU:[id number] DOB: 1951-03-11 DOA: 06/10/2020  PCP: System, Pcp Not In  Patient coming from: Home   Chief Complaint:  Chief Complaint  Patient presents with  . Shortness of Breath  . Dizziness     HPI:    69 year old male with past medical history of obstructive uropathy secondary to benign prostatic hyperplasia, depression, hypertension, recent diagnosis of severe acute kidney injury in early August currently receiving dialysis (Tues, Thurs, Saturday) who presents to [facility] emergency department complaints of lightheadedness, generalized weakness and hematuria.  Of note, patient was recently hospitalized at [facility] from 7/30-8/20 for severe acute kidney injury secondary to obstructive uropathy thought to be secondary to BPH.  Patient was followed by Dr. Alinda Money with urology and a Foley catheter was placed.  After renal function failed to improve patient was initiated on dialysis 8/1.  Later, a right IJ tunneled hemodialysis catheter was placed.  Patient still being managed as "acute kidney injury" in the hopes that renal function will improve and dialysis will be discontinued.  Hospitalization was also complicated by significant hematuria resulting in enough blood loss anemia to require blood cell transfusions on 8/3 and 8/5 with eventual discharge to skilled nursing facility on 8/20 with continued hemodialysis.  Outpatient follow-up with Dr. Alinda Money with urology was also arranged.   Patient reports that he awoke from sleep this morning with a feeling of generalized malaise and weakness.  Patient noticed that he had blood in his Foley catheter bag with several clots.  Patient attempted to get out of bed and immediately noted that he was extremely weak and felt very lightheaded upon standing from a seated position.  Patient immediately sat down with improvement in his symptoms.  Patient continued to feel severe generalized weakness and  poor appetite throughout the day.  Patient denies nausea, vomiting, abdominal pain, fevers, sick contacts or contacts with confirmed COVID-19 infection.  Due to patient's progressive symptoms throughout the day the patient eventually presented to [facility] emergency department for evaluation.  Upon evaluation in the emergency department patient was found to have a leukocytosis of 11.9 with notable gross hematuria in his Foley catheter bag.  Urinalysis was found to be suggestive of urinary tract infection and patient was initiated on ceftriaxone 1 g IV administered.  Patient has refused Foley catheter placement in the emergency department.  The hospitalist group has now been called to assess the patient for admission to the hospital.  Review of Systems: \  Review of Systems  Constitutional: Positive for malaise/fatigue.  Genitourinary: Positive for hematuria.  Musculoskeletal: Positive for myalgias.  Neurological: Positive for weakness.  All other systems reviewed and are negative.     Past Medical History:  Diagnosis Date  . AKI (acute kidney injury) ([place]) 05/2020  . Depression   . Glaucoma   . Hypertension     Past Surgical History:  Procedure Laterality Date  . IR FLUORO GUIDE CV LINE RIGHT  05/12/2020  . IR FLUORO GUIDE CV LINE RIGHT  05/28/2020  . IR US GUIDE VASC ACCESS RIGHT  05/12/2020  . IR US GUIDE VASC ACCESS RIGHT  05/28/2020     reports that he has never smoked. He has never used smokeless tobacco. He reports that he does not drink alcohol and does not use drugs.  Allergies  Allergen Reactions  . Penicillins     Family History  Problem Relation Age of Onset  . Stroke Mother  Prior to Admission medications   Medication Sig Start Date End Date Taking? Authorizing Provider  divalproex (DEPAKOTE) 125 MG DR tablet Take 125 mg by mouth 2 (two) times daily. 12/19/19  Yes [provider]  docusate sodium (COLACE) 100 MG capsule Take 1 capsule (100  mg total) by mouth 2 (two) times daily. 06/01/20  Yes Pokhrel, Laxman, MD  FLUoxetine (PROZAC) 40 MG capsule Take 1 capsule (40 mg total) by mouth daily. 06/02/20  Yes Pokhrel, Laxman, MD  latanoprost (XALATAN) 0.005 % ophthalmic solution Place 1 drop into both eyes at bedtime.  12/19/19  Yes [provider]  magnesium oxide (MAG-OX) 400 (241.3 Mg) MG tablet Take 1 tablet (400 mg total) by mouth daily. 06/02/20  Yes Pokhrel, Laxman, MD  melatonin 5 MG TABS Take 1 tablet (5 mg total) by mouth at bedtime. 06/01/20  Yes Pokhrel, Laxman, MD  Nutritional Supplements (FEEDING SUPPLEMENT, NEPRO CARB STEADY,) LIQD Take 237 mLs by mouth 2 (two) times daily between meals. 06/01/20  Yes Pokhrel, Laxman, MD  QUEtiapine (SEROQUEL) 200 MG tablet Take 200 mg by mouth at bedtime.  11/14/19  Yes [provider]  tamsulosin (FLOMAX) 0.4 MG CAPS capsule Take 2 capsules (0.8 mg total) by mouth daily after supper. 06/01/20  Yes Pokhrel, Laxman, MD  zolpidem (AMBIEN) 5 MG tablet Take 5 mg by mouth at bedtime as needed for sleep.   Yes [provider]  timolol (TIMOPTIC) 0.5 % ophthalmic solution Place 1 drop into both eyes in the morning and at bedtime. 07/14/19   [provider]    Physical Exam: Vitals:   06/10/20 2145 06/10/20 2200 06/10/20 2215 06/10/20 2230  BP: (!) 135/92 139/81 138/84 (!) 157/91  Pulse: 80 78 80 91  Resp: 15 17 14 19   Temp:      TempSrc:      SpO2: 99% 98% 98% 99%    Constitutional: Acute alert and oriented x3, no associated distress.   Skin: no rashes, no lesions, good skin turgor noted. Eyes: Pupils are equally reactive to light.  No evidence of scleral icterus or conjunctival pallor.  ENMT: Slightly dry mucous membranes noted.  Posterior pharynx clear of any exudate or lesions.   Neck: normal, supple, no masses, no thyromegaly.  No evidence of jugular venous distension.   Respiratory: clear to auscultation bilaterally, no wheezing, no crackles. Normal  respiratory effort. No accessory muscle use.  Cardiovascular: Regular rate and rhythm, no murmurs / rubs / gallops. No extremity edema. 2+ pedal pulses. No carotid bruits.  Chest:   Nontender without crepitus or deformity.   Back:   Nontender without crepitus or deformity. Abdomen: Abdomen is soft and nontender.  No evidence of intra-abdominal masses.  Positive bowel sounds noted in all quadrants.   GU: Foley catheter in place draining blood-tinged urine without clots. Musculoskeletal: No joint deformity upper and lower extremities. Good ROM, no contractures. Normal muscle tone.  Neurologic: CN 2-12 grossly intact. Sensation intact, strength noted to be 5 out of 5 in all 4 extremities.  Patient is following all commands.  Patient is responsive to verbal stimuli.   Psychiatric: Notable anxious mood with labile affect.  Patient seems to possess insight as to theircurrent situation.     Labs on Admission: I have personally reviewed following labs and imaging studies -   CBC: Recent Labs  Lab 06/10/20 1156  WBC 11.9*  HGB 11.6*  HCT 38.2*  MCV 95.0  PLT 789   Basic Metabolic Panel: Recent Labs  Lab 06/10/20 1156  NA 137  K 3.7  CL 96*  CO2 25  GLUCOSE 94  BUN 26*  CREATININE 6.20*  CALCIUM 8.1*   GFR: Estimated Creatinine Clearance: 14.1 mL/min (A) (by C-G formula based on SCr of 6.2 mg/dL (H)). Liver Function Tests: No results for input(s): AST, ALT, ALKPHOS, BILITOT, PROT, ALBUMIN in the last 168 hours. No results for input(s): LIPASE, AMYLASE in the last 168 hours. No results for input(s): AMMONIA in the last 168 hours. Coagulation Profile: No results for input(s): INR, PROTIME in the last 168 hours. Cardiac Enzymes: No results for input(s): CKTOTAL, CKMB, CKMBINDEX, TROPONINI in the last 168 hours. BNP (last 3 results) No results for input(s): PROBNP in the last 8760 hours. HbA1C: No results for input(s): HGBA1C in the last 72 hours. CBG: No results for input(s):  GLUCAP in the last 168 hours. Lipid Profile: No results for input(s): CHOL, HDL, LDLCALC, TRIG, CHOLHDL, LDLDIRECT in the last 72 hours. Thyroid Function Tests: No results for input(s): TSH, T4TOTAL, FREET4, T3FREE, THYROIDAB in the last 72 hours. Anemia Panel: No results for input(s): VITAMINB12, FOLATE, FERRITIN, TIBC, IRON, RETICCTPCT in the last 72 hours. Urine analysis:    Component Value Date/Time   COLORURINE AMBER (A) 06/10/2020 2143   APPEARANCEUR CLOUDY (A) 06/10/2020 2143   LABSPEC 1.011 06/10/2020 2143   PHURINE 9.0 (H) 06/10/2020 2143   GLUCOSEU NEGATIVE 06/10/2020 2143   HGBUR LARGE (A) 06/10/2020 2143   BILIRUBINUR NEGATIVE 06/10/2020 2143   KETONESUR 5 (A) 06/10/2020 2143   PROTEINUR 100 (A) 06/10/2020 2143   NITRITE NEGATIVE 06/10/2020 2143   LEUKOCYTESUR LARGE (A) 06/10/2020 2143    Radiological Exams on Admission - Personally Reviewed: [facility] Chest Port 1 View  Result Date: 06/10/2020 CLINICAL DATA:  Dizziness and shortness of breath.  Dialysis today. EXAM: PORTABLE CHEST 1 VIEW COMPARISON:  None. FINDINGS: Right-sided dialysis catheter tip in the mid SVC. Heart is normal in size. Normal mediastinal contours. No pulmonary edema. No focal airspace disease, large pleural effusion or pneumothorax. No acute osseous abnormalities are seen. IMPRESSION: No acute chest findings. Electronically Signed   By: Keith Rake M.D.   On: 06/10/2020 18:18    EKG: Personally reviewed.  Rhythm is normal sinus rhythm with heart rate of 99 bpm.  No dynamic ST segment changes appreciated.  Notable PACs and prolonged QTC of 510 ms.  Assessment/Plan Principal Problem:   Urinary tract infection associated with catheterization of urinary tract, initial encounter [facility])   Patient presenting with hematuria, generalized weakness and lightheadedness several weeks after having a Foley catheter placed for obstructive uropathy  Urinalysis suggestive of urinary tract infection with patient  possessing a leukocytosis of 11.9.  Intravenous ceftriaxone initiated in the emergency department which will be continued, blood and urine cultures have also been obtained and antibiotic therapy will be deescalated based on these results.  We will abstain from intravenous volume resuscitation as patient has advanced renal disease and is on dialysis.  I feel it would be best to exchange the patient's Foley catheter however he is adamantly refusing but states that he would be willing to let his urologist do it  -therefore we will consult urology in the morning for consideration of Foley catheter exchange.     Active Problems:   Gross hematuria   This seems to have improved on arrival in the emergency department somewhat since arrival in the emergency department -we will continue to flush Foley catheter.    Since there are no  clots and I believe that continuous bladder irrigation is necessary at this time.  We will continue to manually flush Foley catheter and monitor for symptomatic improvement as we treat underlying urinary tract infection.  As mentioned above, will consult urology in the morning for Foley catheter exchange and for their recommendations on hematuria.  Monitoring hemoglobin and hematocrit with serial CBCs   Acute kidney injury (AKI) with acute tubular necrosis (ATN) ([place])   Per my review of notes during recent hospitalization with discharge on 8/20, patient is still being managed to someone with severe acute kidney injury in the hopes that patient's renal function will still recover.  Patient is still making urine albeit slightly oliguric.  Will consult nephrology in the morning for resumption of dialysis during this hospitalization  Strict input and output monitoring  Monitor renal function and electrolytes with serial chemistries.    Major depressive disorder   Discontinuing home regimen of Seroquel due to prolonged QT interval  Consideration of reduction or  discontinuation of Prozac as well based on repeat EKG in the morning.    D-dimer, elevated   Patient exhibits a somewhat elevated D-dimer -this is likely secondary to active infection and less likely secondary to thromboembolic disease  bedside pulse oximetry is 100% on no evidence of tachycardia.  BPH with obstruction/lower urinary tract symptoms   Maintain Foley catheter placement during this hospitalization  As mentioned above, will speak to urology in the morning about possible Foley catheter exchange and for their input on patient's ongoing hematuria.  Prolonged QT interval  Patient had to have somewhat prolonged QT interval of 510 ms on arrival on EKG.  Discontinuing home regimen of Seroquel based on medication reconciliation, repeat EKG in the morning  Monitoring patient on telemetry  If EKG is still markedly prolonged, consider discontinuation of Prozac as well.    Code Status:  Full code Family Communication: deferred   Status is: Observation  The patient remains OBS appropriate and will d/c before 2 midnights.  Dispo: The patient is from: SNF              Anticipated d/c is to: SNF              Anticipated d/c date is: 2 days              Patient currently is not medically stable to d/c.        Vernelle Emerald MD [facility] Pager [phone]  If 7PM-7AM, please contact night-coverage [email or web address] Use universal [place] password for that web site. If you do not have the password, please call the hospital operator.  06/11/2020, 12:18 AM

## 2020-06-11 NOTE — Evaluation (Addendum)
I agree with the following treatment note after review of the documentation. This session was performed under the supervision of a licensed clinician.   Lou Miner, DPT  Acute Rehabilitation Services  Pager: [phone]  Physical Therapy Evaluation Patient Details Name: Manuel Ortega MRN: [id number] DOB: Mar 14, 1951 Today's Date: 06/11/2020   History of Present Illness  Pt is a 69 yo male who presents to the hospital with malaise, fatigue, and generalized weakness due to a UTI. Pt was noted to have hematuria and a recent AKI 05/13/2020. Pt was admitted from [facility] and is currently on hemodialysis for T,Th, and Saturday. Pt has a PMH of HTn and depression  Clinical Impression  Pt was evaluated for the above diagnosis and impairments below. Pt required supervision to min guard assit with all mobility tasks using RW. Pt limited this session secondary to fatigue. Required seated rest break during mobility. Weakness noted in BLE. Reviewed HEP with pt. He lives alone at a townhouse with a flight of stairs to the second floor. Recommend returning to SNF to finish rehab prior to return home. Pt would continue to benefit from acute therapy in order to increase independence with functional tasks. Will continue to follow acutely.     Follow Up Recommendations SNF    Equipment Recommendations  None recommended by PT    Recommendations for Other Services       Precautions / Restrictions Precautions Precautions: Fall Restrictions Weight Bearing Restrictions: No      Mobility  Bed Mobility Overal bed mobility: Modified Independent             General bed mobility comments: pt was in bathroom when therapy started and returned to bed after ambulation  Transfers Overall transfer level: Needs assistance Equipment used: None Transfers: Sit to/from Stand Sit to Stand: Min guard         General transfer comment: Min guard for safety. Required grab bars to pull into  standing from toilet.   Ambulation/Gait Ambulation/Gait assistance: Min guard Gait Distance (Feet): 50 Feet (X2) Assistive device: Rolling walker (2 wheeled) Gait Pattern/deviations: Decreased stride length;Step-through pattern;Trunk flexed Gait velocity: decreased   General Gait Details: pt required min guard assist with RW for ambulation. Pt was noted to be slow and unsteady with gait with noticable fatigue after 50 ft of ambulation. Pt required seated rest break secondary to fatigue.   Stairs            Wheelchair Mobility    Modified Rankin (Stroke Patients Only)       Balance Overall balance assessment: Needs assistance Sitting-balance support: No upper extremity supported;Feet supported Sitting balance-Leahy Scale: Fair     Standing balance support: Bilateral upper extremity supported;No upper extremity supported;During functional activity Standing balance-Leahy Scale: Fair Standing balance comment: pt was able to statically stand without UE support; however, he required BUE support on RW with dyanmic mobility            Pertinent Vitals/Pain Pain Assessment: Faces Faces Pain Scale: No hurt    Home Living Family/patient expects to be discharged to:: Private residence Living Arrangements: Alone Available Help at Discharge:  (cousin lives in [place]) Type of Home: House Home Access: Stairs to enter Entrance Stairs-Rails: None Entrance Stairs-Number of Steps: 1 Home Layout: Two level Home Equipment: Grab bars - tub/shower      Prior Function Level of Independence: Needs assistance   Gait / Transfers Assistance Needed: Pt currently in rehab working with therapy. Has been using RW  while at SNF           Hand Dominance        Extremity/Trunk Assessment   Upper Extremity Assessment Upper Extremity Assessment: Generalized weakness    Lower Extremity Assessment Lower Extremity Assessment: Generalized weakness    Cervical / Trunk  Assessment Cervical / Trunk Assessment: Normal  Communication   Communication: No difficulties  Cognition Arousal/Alertness: Awake/alert Behavior During Therapy: WFL for tasks assessed/performed Overall Cognitive Status: Within Functional Limits for tasks assessed                 General Comments General comments (skin integrity, edema, etc.): pt noted SOB, weakness and fatigue. Pt was educated on some HEP for further LE strengthening     Exercises General Exercises - Lower Extremity Ankle Circles/Pumps: AROM;Both;10 reps;Supine Heel Slides: AROM;Both;5 reps Straight Leg Raises: AROM;Both;5 reps;Supine Other Exercises Other Exercises: glute bridges in supine x 5    Assessment/Plan    PT Assessment Patient needs continued PT services  PT Problem List Decreased strength;Decreased range of motion;Decreased activity tolerance;Decreased balance;Decreased mobility;Decreased coordination;Decreased knowledge of use of DME;Decreased safety awareness;Cardiopulmonary status limiting activity       PT Treatment Interventions DME instruction;Gait training;Stair training;Functional mobility training;Therapeutic activities;Therapeutic exercise;Balance training;Neuromuscular re-education;Patient/family education    PT Goals (Current goals can be found in the Care Plan section)  Acute Rehab PT Goals Patient Stated Goal: to improve endurance and tolerance for activity PT Goal Formulation: With patient Time For Goal Achievement: 06/25/20 Potential to Achieve Goals: Good    Frequency Min 2X/week   Barriers to discharge Decreased caregiver support pt lives alone at home    Co-evaluation          AM-PAC PT "6 Clicks" Mobility  Outcome Measure Help needed turning from your back to your side while in a flat bed without using bedrails?: None Help needed moving from lying on your back to sitting on the side of a flat bed without using bedrails?: None Help needed moving to and from a bed  to a chair (including a wheelchair)?: A Little Help needed standing up from a chair using your arms (e.g., wheelchair or bedside chair)?: A Little Help needed to walk in hospital room?: A Little Help needed climbing 3-5 steps with a railing? : A Lot 6 Click Score: 19    End of Session Equipment Utilized During Treatment: Gait belt Activity Tolerance: Patient tolerated treatment well Patient left: in bed;with call bell/phone within reach;with bed alarm set Nurse Communication: Mobility status PT Visit Diagnosis: Unsteadiness on feet (R26.81);Muscle weakness (generalized) (M62.81)    Time: 7253-6644 PT Time Calculation (min) (ACUTE ONLY): 27 min   Charges:   PT Evaluation $PT Eval Moderate Complexity: 1 Mod PT Treatments $Gait Training: 8-22 mins        Gloriann Loan, SPT  Acute Rehabilitation Services  Office: [phone]  06/11/2020, 3:59 PM

## 2020-06-11 NOTE — Progress Notes (Signed)
69 year old male with past medical history of obstructive uropathy secondary to benign prostatic hyperplasia, depression, hypertension, recent diagnosis of severe acute kidney injury in early August currently receiving dialysis (Tues, Thurs, Saturday) who presents to [facility] emergency department complaints of lightheadedness, generalized weakness and hematuria.  Plan:  Nephrology and urology consulted , continue with IV antibiotics.    Hosie Poisson, MD

## 2020-06-12 DIAGNOSIS — I120 Hypertensive chronic kidney disease with stage 5 chronic kidney disease or end stage renal disease: Secondary | ICD-10-CM | POA: Diagnosis present

## 2020-06-12 DIAGNOSIS — N170 Acute kidney failure with tubular necrosis: Secondary | ICD-10-CM | POA: Diagnosis present

## 2020-06-12 DIAGNOSIS — N400 Enlarged prostate without lower urinary tract symptoms: Secondary | ICD-10-CM | POA: Diagnosis present

## 2020-06-12 DIAGNOSIS — R310 Gross hematuria: Secondary | ICD-10-CM | POA: Diagnosis present

## 2020-06-12 DIAGNOSIS — G4700 Insomnia, unspecified: Secondary | ICD-10-CM | POA: Diagnosis present

## 2020-06-12 DIAGNOSIS — N2581 Secondary hyperparathyroidism of renal origin: Secondary | ICD-10-CM | POA: Diagnosis present

## 2020-06-12 DIAGNOSIS — R0602 Shortness of breath: Secondary | ICD-10-CM | POA: Diagnosis present

## 2020-06-12 DIAGNOSIS — N390 Urinary tract infection, site not specified: Secondary | ICD-10-CM | POA: Diagnosis present

## 2020-06-12 DIAGNOSIS — N186 End stage renal disease: Secondary | ICD-10-CM | POA: Diagnosis present

## 2020-06-12 DIAGNOSIS — T83511A Infection and inflammatory reaction due to indwelling urethral catheter, initial encounter: Secondary | ICD-10-CM | POA: Diagnosis present

## 2020-06-12 DIAGNOSIS — N139 Obstructive and reflux uropathy, unspecified: Secondary | ICD-10-CM | POA: Diagnosis present

## 2020-06-12 DIAGNOSIS — N138 Other obstructive and reflux uropathy: Secondary | ICD-10-CM | POA: Diagnosis present

## 2020-06-12 DIAGNOSIS — B965 Pseudomonas (aeruginosa) (mallei) (pseudomallei) as the cause of diseases classified elsewhere: Secondary | ICD-10-CM | POA: Diagnosis present

## 2020-06-12 DIAGNOSIS — F329 Major depressive disorder, single episode, unspecified: Secondary | ICD-10-CM | POA: Diagnosis present

## 2020-06-12 DIAGNOSIS — D649 Anemia, unspecified: Secondary | ICD-10-CM | POA: Diagnosis present

## 2020-06-12 DIAGNOSIS — D638 Anemia in other chronic diseases classified elsewhere: Secondary | ICD-10-CM | POA: Diagnosis present

## 2020-06-12 DIAGNOSIS — Z880 Allergy status to penicillin: Secondary | ICD-10-CM | POA: Diagnosis not present

## 2020-06-12 DIAGNOSIS — Z20822 Contact with and (suspected) exposure to covid-19: Secondary | ICD-10-CM | POA: Diagnosis present

## 2020-06-12 DIAGNOSIS — E8889 Other specified metabolic disorders: Secondary | ICD-10-CM | POA: Diagnosis present

## 2020-06-12 DIAGNOSIS — R630 Anorexia: Secondary | ICD-10-CM | POA: Diagnosis present

## 2020-06-12 DIAGNOSIS — Z823 Family history of stroke: Secondary | ICD-10-CM | POA: Diagnosis not present

## 2020-06-12 DIAGNOSIS — R34 Anuria and oliguria: Secondary | ICD-10-CM | POA: Diagnosis present

## 2020-06-12 DIAGNOSIS — Y846 Urinary catheterization as the cause of abnormal reaction of the patient, or of later complication, without mention of misadventure at the time of the procedure: Secondary | ICD-10-CM | POA: Diagnosis present

## 2020-06-12 DIAGNOSIS — Z79899 Other long term (current) drug therapy: Secondary | ICD-10-CM | POA: Diagnosis not present

## 2020-06-12 DIAGNOSIS — Z992 Dependence on renal dialysis: Secondary | ICD-10-CM | POA: Diagnosis not present

## 2020-06-12 DIAGNOSIS — I10 Essential (primary) hypertension: Secondary | ICD-10-CM | POA: Diagnosis not present

## 2020-06-12 DIAGNOSIS — N401 Enlarged prostate with lower urinary tract symptoms: Secondary | ICD-10-CM | POA: Diagnosis present

## 2020-06-12 LAB — RENAL FUNCTION PANEL
Albumin: 2.9 g/dL — ABNORMAL LOW (ref 3.5–5.0)
Anion gap: 16 — ABNORMAL HIGH (ref 5–15)
BUN: 51 mg/dL — ABNORMAL HIGH (ref 8–23)
CO2: 22 mmol/L (ref 22–32)
Calcium: 7.5 mg/dL — ABNORMAL LOW (ref 8.9–10.3)
Chloride: 99 mmol/L (ref 98–111)
Creatinine, Ser: 8.22 mg/dL — ABNORMAL HIGH (ref 0.61–1.24)
GFR calc Af Amer: 7 mL/min — ABNORMAL LOW (ref >60)
GFR calc non Af Amer: 6 mL/min — ABNORMAL LOW (ref >60)
Glucose, Bld: 87 mg/dL (ref 70–99)
Phosphorus: 7.2 mg/dL — ABNORMAL HIGH (ref 2.5–4.6)
Potassium: 3.6 mmol/L (ref 3.5–5.1)
Sodium: 137 mmol/L (ref 135–145)

## 2020-06-12 LAB — CBC
HCT: 33.6 % — ABNORMAL LOW (ref 39.0–52.0)
Hemoglobin: 10.2 g/dL — ABNORMAL LOW (ref 13.0–17.0)
MCH: 28.3 pg (ref 26.0–34.0)
MCHC: 30.4 g/dL (ref 30.0–36.0)
MCV: 93.3 fL (ref 80.0–100.0)
Platelets: 203 10*3/uL (ref 150–400)
RBC: 3.6 MIL/uL — ABNORMAL LOW (ref 4.22–5.81)
RDW: 16 % — ABNORMAL HIGH (ref 11.5–15.5)
WBC: 11.2 10*3/uL — ABNORMAL HIGH (ref 4.0–10.5)
nRBC: 0 % (ref 0.0–0.2)

## 2020-06-12 LAB — URINE CULTURE: Culture: >=100000 — AB

## 2020-06-12 LAB — HEPATITIS B SURFACE ANTIGEN: Hepatitis B Surface Ag: NONREACTIVE

## 2020-06-12 MED ORDER — SODIUM CHLORIDE 0.9 % IV SOLN: 2.0000 g | INTRAVENOUS | Status: DC

## 2020-06-12 MED ORDER — SODIUM CHLORIDE 0.9 % IV SOLN
2.0000 g | INTRAVENOUS | Status: AC
  Administered 2020-06-12: 2 g via INTRAVENOUS
  Filled 2020-06-12: qty 2

## 2020-06-12 NOTE — Progress Notes (Signed)
Renal Navigator received call from CSW/N. Rayyan stating patient says he needs to apply for [employer] [place] again as his expires on 9/19. Navigator expects he is talking about his pre-certification and sent secure message to [employer] [place] Eligibility Coordinator/C. Rorie to see if there is anything patient or Navigator needs to do prior to 9/19, explaining that patient is currently back in the hospital with the plan to return to SNF at discharge. Renal Navigator will follow.  Alphonzo Cruise, [place] Renal Navigator [phone]

## 2020-06-12 NOTE — Progress Notes (Signed)
Renal Navigator received secure message back from [employer] [place] Eligibility Coordinator/C. Rorie that patient does not need to do anything for full certification, since in-person interviews are currently suspended due to [place] pandemic. She states his application for full certification will be processed next week and he will get a letter to his home address. Navigator informed CSW/N. Rayyan.  Manuel Ortega, [place] Renal Navigator [phone]

## 2020-06-12 NOTE — Progress Notes (Signed)
Pharmacy Antibiotic Note  Manuel Ortega is a 69 y.o. male admitted on 06/10/2020 with generalized weakness and dizziness.  Patient was recently hospitalized for severe AKI secondary to obstructive uropathy thought due to BPH.  Patient was discharged home with a foley.  Now with Pseudomonas growing in the urine culture, so Pharmacy has been consulted for cefepime dosing.    Patient has ESRD on TTS HD - tolerated today's HD session.  Afebrile, WBC 11.2.  Plan: Cefepime 2gm IV qHD TTS Pharmacy will sign off.  Thank you for the consult!  Height: 6\' 1"  (185.4 cm) Weight: 97.6 kg (215 lb 2.7 oz) IBW/kg (Calculated) : 79.9  Temp (24hrs), Avg:98.1 F (36.7 C), Min:97.6 F (36.4 C), Max:98.7 F (37.1 C)  Recent Labs  Lab 06/10/20 1156 06/11/20 0837 06/12/20 0644  WBC 11.9* 11.7* 11.2*  CREATININE 6.20* 7.64* 8.22*    Estimated Creatinine Clearance: 10.6 mL/min (A) (by C-G formula based on SCr of 8.22 mg/dL (H)).    Allergies  Allergen Reactions  . Penicillins     CTX 8/30 Cefepime 8/31 >>   8/29 UCx - Pseu (pan sensitive) 8/30 BCx - NGTD  Juancarlos Crescenzo D. Mina Marble, PharmD, BCPS, [place] 06/12/2020, 12:49 PM

## 2020-06-12 NOTE — TOC Initial Note (Signed)
Transition of Care [facility]) - Initial/Assessment Note    Patient Details  Name: Manuel Ortega MRN: [id number] Date of Birth: 03-Nov-1950  Transition of [facility]) CM/SW Contact:    Benard Halsted, [place] Phone Number: 06/12/2020, 12:09 PM  Clinical Narrative:                 Patient requesting assistance with his [employer] [place] transportation. He reports that it will expire on 9/19. Renal Navigator reaching out to [employer] [place] to check to see if anything needs to be done for his re-certification.   Expected Discharge Plan: Skilled Nursing Facility Barriers to Discharge: Continued Medical Work up   Patient Goals and CMS Choice Patient states their goals for this hospitalization and ongoing recovery are:: Return to SNF CMS Medicare.gov Compare Post Acute Care list provided to:: Patient Choice offered to / list presented to : Patient  Expected Discharge Plan and Services Expected Discharge Plan: [place] In-house Referral: Clinical Social Work   Post Acute Care Choice: [place] Living arrangements for the past 2 months: [place], [place]                                      Prior Living Arrangements/Services Living arrangements for the past 2 months: [place], [place] Lives with:: Self Patient language and need for interpreter reviewed:: Yes        Need for Family Participation in Patient Care: No (Comment) Care giver support system in place?: No (comment)   Criminal Activity/Legal Involvement Pertinent to Current Situation/Hospitalization: No - Comment as needed  Activities of Daily Living Home Assistive Devices/Equipment: Eyeglasses ADL Screening (condition at time of admission) Patient's cognitive ability adequate to safely complete daily activities?: Yes Is the patient deaf or have difficulty hearing?: No Does the patient have difficulty seeing, even when wearing glasses/contacts?:  No Does the patient have difficulty concentrating, remembering, or making decisions?: No Patient able to express need for assistance with ADLs?: Yes Does the patient have difficulty dressing or bathing?: No Independently performs ADLs?: Yes (appropriate for developmental age) Does the patient have difficulty walking or climbing stairs?: Yes Weakness of Legs: Both Weakness of Arms/Hands: None  Permission Sought/Granted         Permission granted to share info w AGENCY: [facility]        Emotional Assessment Appearance:: Appears stated age Attitude/Demeanor/Rapport: Engaged Affect (typically observed): Accepting, Appropriate Orientation: : Oriented to Place, Oriented to  Time, Oriented to Situation, Oriented to Self Alcohol / Substance Use: Not Applicable Psych Involvement: No (comment)  Admission diagnosis:  Shortness of breath [R06.02] Pain [R52] Postural dizziness with presyncope [R42, R55] UTI (urinary tract infection) [N39.0] Patient Active Problem List   Diagnosis Date Noted  . UTI (urinary tract infection) 06/12/2020  . Essential hypertension 06/11/2020  . Acute kidney injury (AKI) with acute tubular necrosis (ATN) ([place]) 06/11/2020  . Gross hematuria 06/11/2020  . Major depressive disorder 06/11/2020  . D-dimer, elevated 06/11/2020  . 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 Prolonged QT interval 06/11/2020  . Urinary tract infection associated with catheterization of urinary tract, initial encounter ([place]) 06/10/2020  . Acute kidney injury ([place]) 05/11/2020  . Hypokalemia 05/11/2020  . Hypocalcemia 05/11/2020  . Hypomagnesemia 05/11/2020  . Nausea and vomiting 05/11/2020   PCP:  System, Pcp Not In Pharmacy:   [facility]  [place], [place] [address] [place] [address] Phone: [phone] Fax: [phone]     Social Determinants of Health (SDOH) Interventions    Readmission  Risk Interventions Readmission Risk Prevention Plan 06/12/2020 05/30/2020  Transportation Screening Complete Complete  PCP or Specialist Appt within 3-5 Days Complete Complete  HRI or Home Care Consult Complete Complete  Social Work Consult for [place] Planning/Counseling Complete Complete  Palliative Care Screening Not Applicable Not Applicable  Medication Review Press [employer]) Referral to Pharmacy Complete

## 2020-06-12 NOTE — Procedures (Signed)
I was present at this dialysis session. I have reviewed the session itself and made appropriate changes.   1L UF. TDC. Made 1L UOP from admit but delta SCr and BUN not consistent with adequate GFR, cont HD.  GNRs on UCx, on Ceftriaxone.  Next HD 9/2  Filed Weights   06/11/20 1754 06/12/20 0710  Weight: 99.5 kg 99.3 kg    Recent Labs  Lab 06/12/20 0644  NA 137  K 3.6  CL 99  CO2 22  GLUCOSE 87  BUN 51*  CREATININE 8.22*  CALCIUM 7.5*  PHOS 7.2*    Recent Labs  Lab 06/10/20 1156 06/11/20 0837 06/12/20 0644  WBC 11.9* 11.7* 11.2*  NEUTROABS  --  8.8*  --   HGB 11.6* 10.9* 10.2*  HCT 38.2* 36.4* 33.6*  MCV 95.0 94.3 93.3  PLT 209 196 203    Scheduled Meds: . Chlorhexidine Gluconate Cloth  6 each Topical Daily  . divalproex  125 mg Oral BID  . docusate sodium  100 mg Oral BID  . feeding supplement (NEPRO CARB STEADY)  237 mL Oral BID BM  . FLUoxetine  40 mg Oral Daily  . latanoprost  1 drop Both Eyes QHS  . melatonin  5 mg Oral QHS  . tamsulosin  0.8 mg Oral QPC supper  . timolol  1 drop Both Eyes BID   Continuous Infusions: . cefTRIAXone (ROCEPHIN)  IV 1 g (06/11/20 2156)   PRN Meds:.acetaminophen **OR** acetaminophen, ondansetron **OR** ondansetron (ZOFRAN) IV, polyethylene glycol, zolpidem   Pearson Grippe  MD 06/12/2020, 8:59 AM

## 2020-06-12 NOTE — Progress Notes (Signed)
PROGRESS NOTE    Manuel Ortega  AUQ:[id number] DOB: 08/26/1951 DOA: 06/10/2020 PCP: System, Pcp Not In    Chief Complaint  Patient presents with  . Shortness of Breath  . 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  He was started empirically on IV Rocephin for possible urinary tract infection. Urology consulted for further evaluation recommending to irrigate the hematuria catheter for any blood clots.  Nephrology consulted to initiate dialysis. He was found to have Pseudomonas urinary tract infection and IV Rocephin was changed to IV cefepime today. Assessment & Plan:   Principal Problem:   Urinary tract infection associated with catheterization of urinary tract, initial encounter [facility]) Active Problems:   Essential hypertension   Acute kidney injury (AKI) with acute tubular necrosis (ATN) ([facility])   Gross hematuria   Major depressive disorder   D-dimer, elevated   BPH with obstruction/lower urinary tract symptoms   Prolonged QT interval   UTI (urinary tract infection)   Urinary tract infection Urine cultures grew Pseudomonas sensitive to cefepime IV Rocephin transition to IV cefepime. Hematuria has improved and urine in the bag is clear. Appreciate urology recommendations.  We will continue to monitor at this time.     Acute kidney injury with acute tubular necrosis Initiation of dialysis on last admission and to continue.  Nephrology on board.     Hypertension Blood pressure parameters are well controlled.    Anemia of chronic disease Hemoglobin stable at 10.    Gross hematuria Possible secondary to infection.    Depression Continue with Depakote and Prozac.    BPH Continue with  Flomax.      DVT prophylaxis:SCDs Code Status: Full code Family Communication: (None at bedside Disposition:   Status is: Inpatient  Remains inpatient appropriate because:IV treatments appropriate due to intensity of illness or inability to take PO   Dispo: The patient is from: SNF              Anticipated d/c is to: SNF              Anticipated d/c date is: 3 days              Patient currently is not medically stable to d/c.       Consultants:   Nephrology  Urology   Procedures: none.    Antimicrobials: Anti-infectives (From admission, onward)   Start     Dose/Rate Route Frequency Ordered Stop   06/14/20 1800  ceFEPIme (MAXIPIME) 2 g in sodium chloride 0.9 % 100 mL IVPB        2 g 200 mL/hr over 30 Minutes Intravenous Every T-Th-Sa (1800) 06/12/20 1252     06/12/20 1300  ceFEPIme (MAXIPIME) 2 g in sodium chloride 0.9 % 100 mL IVPB        2 g 200 mL/hr over 30 Minutes Intravenous NOW 06/12/20 1252 06/12/20 1423   06/11/20 2200  cefTRIAXone (ROCEPHIN) 1 g in sodium chloride 0.9 % 100 mL IVPB  Status:  Discontinued        1 g 200 mL/hr over 30 Minutes Intravenous Every 24 hours 06/11/20 0015 06/12/20 1211   06/10/20 2215  cefTRIAXone (ROCEPHIN) 1 g in sodium chloride 0.9 % 100 mL IVPB        1 g 200 mL/hr over  30 Minutes Intravenous  Once 06/10/20 2214 06/11/20 0048          Subjective: No Chest pain or sob, no nausea, vomiting or abd pain.   Objective: Vitals:   06/12/20 1000 06/12/20 1030 06/12/20 1046 06/12/20 1119  BP: 118/75 108/69 107/73 110/72  Pulse: 72 72 71 75  Resp:   18 17  Temp:   98.1 F (36.7 C) 98.6 F (37 C)  TempSrc:   Oral Oral  SpO2:   98% 100%  Weight:   97.6 kg   Height:        Intake/Output Summary (Last 24 hours) at 06/12/2020 1520 Last data filed at 06/12/2020 1400 Gross per 24 hour  Intake 100 ml  Output 1379 ml  Net -1279 ml   Filed Weights   06/11/20 1754 06/12/20 0710 06/12/20 1046  Weight: 99.5 kg 99.3 kg  97.6 kg    Examination:  General exam: Appears calm and comfortable  Respiratory system: Clear to auscultation. Respiratory effort normal. Cardiovascular system: S1 & S2 heard, RRR. No JVD,No pedal edema. Gastrointestinal system: Abdomen is nondistended, soft and nontender.  Normal bowel sounds heard. Central nervous system: Alert and oriented. No focal neurological deficits. Extremities: Symmetric 5 x 5 power. Skin: No rashes, lesions or ulcers Psychiatry: Mood & affect appropriate.     Data Reviewed: I have personally reviewed following labs and imaging studies  CBC: Recent Labs  Lab 06/10/20 1156 06/11/20 0837 06/12/20 0644  WBC 11.9* 11.7* 11.2*  NEUTROABS  --  8.8*  --   HGB 11.6* 10.9* 10.2*  HCT 38.2* 36.4* 33.6*  MCV 95.0 94.3 93.3  PLT 209 196 144    Basic Metabolic Panel: Recent Labs  Lab 06/10/20 1156 06/11/20 0837 06/12/20 0644  NA 137 138 137  K 3.7 3.9 3.6  CL 96* 98 99  CO2 25 25 22   GLUCOSE 94 115* 87  BUN 26* 34* 51*  CREATININE 6.20* 7.64* 8.22*  CALCIUM 8.1* 8.0* 7.5*  MG  --  2.6*  --   PHOS  --   --  7.2*    GFR: Estimated Creatinine Clearance: 10.6 mL/min (A) (by C-G formula based on SCr of 8.22 mg/dL (H)).  Liver Function Tests: Recent Labs  Lab 06/11/20 0837 06/12/20 0644  AST 15  --   ALT 12  --   ALKPHOS 70  --   BILITOT 0.5  --   PROT 6.2*  --   ALBUMIN 3.1* 2.9*    CBG: No results for input(s): GLUCAP in the last 168 hours.   Recent Results (from the past 240 hour(s))  SARS Coronavirus 2 by RT PCR (hospital order, performed in [facility] hospital lab) Nasopharyngeal Nasopharyngeal Swab     Status: None   Collection Time: 06/10/20  6:32 PM   Specimen: Nasopharyngeal Swab  Result Value Ref Range Status   SARS Coronavirus 2 NEGATIVE NEGATIVE Final    Comment: (NOTE) SARS-CoV-2 target nucleic acids are NOT DETECTED.  The SARS-CoV-2 RNA is generally detectable in upper and lower respiratory specimens during the  acute phase of infection. The lowest concentration of SARS-CoV-2 viral copies this assay can detect is 250 copies / mL. A negative result does not preclude SARS-CoV-2 infection and should not be used as the sole basis for treatment or other patient management decisions.  A negative result may occur with improper specimen collection / handling, submission of specimen other than nasopharyngeal swab, presence of viral mutation(s) within the areas targeted by this  assay, and inadequate number of viral copies (<250 copies / mL). A negative result must be combined with clinical observations, patient history, and epidemiological information.  Fact Sheet for Patients:   StrictlyIdeas.no  Fact Sheet for Healthcare Providers: BankingDealers.co.za  This test is not yet approved or  cleared by the [place] FDA and has been authorized for detection and/or diagnosis of SARS-CoV-2 by FDA under an Emergency Use Authorization (EUA).  This EUA will remain in effect (meaning this test can be used) for the duration of the COVID-19 declaration under Section 564(b)(1) of the Act, 21 U.S.C. section 360bbb-3(b)(1), unless the authorization is terminated or revoked sooner.  Performed at [facility], [place] [address], [place], [place] [address]   Urine culture     Status: Abnormal   Collection Time: 06/10/20 10:32 PM   Specimen: Urine, Random  Result Value Ref Range Status   Specimen Description URINE, RANDOM  Final   Special Requests   Final    NONE Performed at [facility], [place] [address], [place], [place] [address]    Culture >=100,000 COLONIES/mL PSEUDOMONAS AERUGINOSA (A)  Final   Report Status 06/12/2020 FINAL  Final   Organism ID, Bacteria PSEUDOMONAS AERUGINOSA (A)  Final      Susceptibility   Pseudomonas aeruginosa - MIC*    CEFTAZIDIME 4 SENSITIVE Sensitive     CIPROFLOXACIN <=0.25 SENSITIVE Sensitive     GENTAMICIN <=1  SENSITIVE Sensitive     IMIPENEM 2 SENSITIVE Sensitive     PIP/TAZO 16 SENSITIVE Sensitive     CEFEPIME 2 SENSITIVE Sensitive     * >=100,000 COLONIES/mL PSEUDOMONAS AERUGINOSA  Culture, blood (Routine X 2) w Reflex to ID Panel     Status: None (Preliminary result)   Collection Time: 06/11/20  8:37 AM   Specimen: BLOOD  Result Value Ref Range Status   Specimen Description BLOOD RIGHT ANTECUBITAL  Final   Special Requests   Final    BOTTLES DRAWN AEROBIC AND ANAEROBIC Blood Culture adequate volume   Culture   Final    NO GROWTH < 24 HOURS Performed at [facility], [place] [address], [place], [place] [address]    Report Status PENDING  Incomplete  Culture, blood (Routine X 2) w Reflex to ID Panel     Status: None (Preliminary result)   Collection Time: 06/11/20  9:17 AM   Specimen: BLOOD RIGHT HAND  Result Value Ref Range Status   Specimen Description BLOOD RIGHT HAND  Final   Special Requests   Final    BOTTLES DRAWN AEROBIC AND ANAEROBIC Blood Culture results may not be optimal due to an excessive volume of blood received in culture bottles   Culture   Final    NO GROWTH < 24 HOURS Performed at [facility], [place] [address] [place], [place] [address]    Report Status PENDING  Incomplete         Radiology Studies: DG Chest Port 1 View  Result Date: 06/10/2020 CLINICAL DATA:  Dizziness and shortness of breath.  Dialysis today. EXAM: PORTABLE CHEST 1 VIEW COMPARISON:  None. FINDINGS: Right-sided dialysis catheter tip in the mid SVC. Heart is normal in size. Normal mediastinal contours. No pulmonary edema. No focal airspace disease, large pleural effusion or pneumothorax. No acute osseous abnormalities are seen. IMPRESSION: No acute chest findings. Electronically Signed   By: Keith Rake M.D.   On: 06/10/2020 18:18        Scheduled Meds: .  Chlorhexidine Gluconate Cloth  6 each Topical Daily  . divalproex  125 mg Oral BID  . docusate sodium  100 mg Oral BID    . feeding supplement (NEPRO CARB STEADY)  237 mL Oral BID BM  . FLUoxetine  40 mg Oral Daily  . latanoprost  1 drop Both Eyes QHS  . melatonin  5 mg Oral QHS  . tamsulosin  0.8 mg Oral QPC supper  . timolol  1 drop Both Eyes BID   Continuous Infusions: . [START ON 06/14/2020] ceFEPime (MAXIPIME) IV       LOS: 0 days       Hosie Poisson, MD [facility]   To contact the attending provider between 7A-7P or the covering provider during after hours 7P-7A, please log into the web site [email or web address] and access using universal [place] password for that web site. If you do not have the password, please call the hospital operator.  06/12/2020, 3:20 PM

## 2020-06-13 MED ORDER — CIPROFLOXACIN HCL 500 MG PO TABS: 500.0000 mg | ORAL_TABLET | Freq: Every day | ORAL | Status: AC

## 2020-06-13 MED ORDER — ZOLPIDEM TARTRATE 5 MG PO TABS: 5.0000 mg | ORAL_TABLET | Freq: Every evening | ORAL | 0 refills | Status: DC | PRN

## 2020-06-13 NOTE — Discharge Summary (Signed)
Physician Discharge Summary  Manuel Ortega TGP:[id number] DOB: 09/06/1951 DOA: 06/10/2020  PCP: System, Pcp Not In  Admit date: 06/10/2020 Discharge date: 06/13/2020  Admitted From: SNF Disposition: SNF  Recommendations for Outpatient Follow-up:  1. Follow up with PCP in 1 week 2. Follow up with Urology on 9/15 with Dr. Alinda Money for urinary retention/ hydronephrosis/ hematuria 3. Please obtain BMP/CBC in one week 4. 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  Of note, patient was recently hospitalized at [facility] from 7/30-8/20 for severe acute kidney injury secondary to obstructive uropathy thought to be secondary to BPH.  Patient was followed by Dr. Alinda Money with urology and a Foley catheter was placed.  After renal function failed to improve patient was initiated on dialysis 8/1.  Later, a right IJ tunneled hemodialysis catheter was placed.  Patient still being managed as "acute kidney injury" in the hopes that renal function will improve and dialysis will be discontinued.  Hospitalization was also complicated by significant hematuria resulting in enough blood loss anemia to require blood cell transfusions on 8/3 and 8/5 with eventual discharge to skilled nursing facility on 8/20 with continued hemodialysis.  Outpatient follow-up with Dr. Alinda Money with urology was also arranged.   Patient reports that he awoke from sleep this  morning with a feeling of generalized malaise and weakness.  Patient noticed that he had blood in his Foley catheter bag with several clots.  Patient attempted to get out of bed and immediately noted that he was extremely weak and felt very lightheaded upon standing from a seated position.  Patient immediately sat down with improvement in his symptoms.  Patient continued to feel severe generalized weakness and poor appetite throughout the day.  Patient denies nausea, vomiting, abdominal pain, fevers, sick contacts or contacts with confirmed COVID-19 infection.  Due to patient's progressive symptoms throughout the day the patient eventually presented to [facility] emergency department for evaluation.  Upon evaluation in the emergency department patient was found to have a leukocytosis of 11.9 with notable gross hematuria in his Foley catheter bag.  Urinalysis was found to be suggestive of urinary tract infection and patient was initiated on ceftriaxone 1 g IV administered.  Patient has refused Foley catheter placement in the emergency department.  The hospitalist group has now been called to assess the patient for admission to the hospital.    Hospital course:  UTI, complicated Hematuria Chronic foley Patient empirically treated with Ceftriaxone IV on admission. Urine culture significant for Pseudomonas aeruginosa and antibiotics switched to Cefepime IV on hospital day #2, dosed with hemodialysis. Patient to be discharged on Ciprofloxacin 500 mg qHS (renally/HD dosed) to complete a 7 day course. Urology consulted and recommend outpatient follow-up. Urology recommends not to change foley prior to discharge; will manage as an outpatient.  AKI secondary to ATN Previously diagnosed prior to admission. Currently on HD. Will continue on discharge.  Essential hypertension Not on medication. BP has been well controlled.  Anemia of chronic disease Stable.  Depression Continue Prozac, Depakote,  Seroquel  Insomnia Continue Ambien prn  BPH Continue Flomax  Discharge Diagnoses:  Principal Problem:   Urinary tract infection associated with catheterization of urinary tract, initial encounter [facility]) Active Problems:   Essential hypertension   Acute kidney injury (AKI) with acute tubular necrosis (ATN) (HCC)   Gross hematuria   Major depressive disorder   D-dimer, elevated   BPH with obstruction/lower urinary tract symptoms   Prolonged QT interval   UTI (urinary tract infection)    Discharge Instructions   Allergies as of 06/13/2020      Reactions   Penicillins       Medication List    TAKE these medications   ciprofloxacin 500 MG tablet Commonly known as: CIPRO Take 1 tablet (500 mg total) by mouth at bedtime for 5 days. First dose on 06/14/2020 Start taking on: June 14, 2020   divalproex 125 MG DR tablet Commonly known as: DEPAKOTE Take 125 mg by mouth 2 (two) times daily.   docusate sodium 100 MG capsule Commonly known as: COLACE Take 1 capsule (100 mg total) by mouth 2 (two) times daily.   feeding supplement (NEPRO CARB STEADY) Liqd Take 237 mLs by mouth 2 (two) times daily between meals.   FLUoxetine 40 MG capsule Commonly known as: PROZAC Take 1 capsule (40 mg total) by mouth daily.   latanoprost 0.005 % ophthalmic solution Commonly known as: XALATAN Place 1 drop into both eyes at bedtime.   magnesium oxide 400 (241.3 Mg) MG tablet Commonly known as: MAG-OX Take 1 tablet (400 mg total) by mouth daily.   melatonin 5 MG Tabs Take 1 tablet (5 mg total) by mouth at bedtime.   QUEtiapine 200 MG tablet Commonly known as: SEROQUEL Take 200 mg by mouth at bedtime.   tamsulosin 0.4 MG Caps capsule Commonly known as: FLOMAX Take 2 capsules (0.8 mg total) by mouth daily after supper.   timolol 0.5 % ophthalmic solution Commonly known as: TIMOPTIC Place 1 drop into both eyes in the morning and at bedtime.   zolpidem 5 MG tablet Commonly known  as: AMBIEN Take 1 tablet (5 mg total) by mouth at bedtime as needed for sleep.       Allergies  Allergen Reactions  . Penicillins     Consultations:  Urology  Nephrology   Procedures/Studies: US RENAL  Result Date: 05/20/2020 CLINICAL DATA:  Inpatient.  Follow-up bilateral hydronephrosis. EXAM: RENAL / URINARY TRACT ULTRASOUND COMPLETE COMPARISON:  05/12/2019 CT abdomen/pelvis. FINDINGS: Right Kidney: Renal measurements: 10.3 x 6.6 x 6.2 cm = volume: 221 mL. Mildly echogenic right renal parenchyma. Mild hydronephrosis, improved from recent CT. No renal mass. Left Kidney: Renal measurements: 10.6 x 5.3 x 4.7 cm = volume: 137 mL. Mildly echogenic left renal parenchyma. Two simple upper left renal cysts, largest 3.4 x 3.3 x 2.7 cm in the posterior upper left kidney. Mild hydronephrosis, improved from recent CT. Bladder: Diffusely thick walled bladder collapsed by indwelling Foley catheter and not well evaluated. Other: None. IMPRESSION: 1. Mild bilateral hydronephrosis, improved bilaterally from recent CT. 2. 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 3. Bladder collapsed by indwelling Foley catheter and not well evaluated. Bladder appears diffusely thick walled, nonspecific, probably due to chronic bladder outlet obstruction. Electronically Signed   By: Ilona Sorrel M.D.   On: 05/20/2020 15:44   IR Fluoro Guide CV Line Right  Result Date: 05/28/2020 INDICATION: 69 year old male with acute kidney injury requiring hemodialysis. He currently has a temporary right IJ HD catheter  in place which was placed on 05/12/2020. he presents for placement of a more durable tunneled hemodialysis catheter. EXAM: TUNNELED CENTRAL VENOUS HEMODIALYSIS CATHETER PLACEMENT WITH ULTRASOUND AND FLUOROSCOPIC GUIDANCE MEDICATIONS: 1 g vancomycin. The antibiotic was given in an appropriate time interval prior to skin puncture. ANESTHESIA/SEDATION: Moderate (conscious)  sedation was employed during this procedure. A total of Versed 1 mg and Fentanyl 25 mcg was administered intravenously. Moderate Sedation Time: 20 minutes. The patient's level of consciousness and vital signs were monitored continuously by radiology nursing throughout the procedure under my direct supervision. FLUOROSCOPY TIME:  Fluoroscopy Time: 0 minutes 36 seconds (2 mGy). COMPLICATIONS: None immediate. PROCEDURE: Informed written consent was obtained from the patient after a discussion of the risks, benefits, and alternatives to treatment. Questions regarding the procedure were encouraged and answered. The right neck and chest were prepped with chlorhexidine in a sterile fashion, and a sterile drape was applied covering the operative field. Maximum barrier sterile technique with sterile gowns and gloves were used for the procedure. A timeout was performed prior to the initiation of the procedure. After creating a small venotomy incision, a micropuncture kit was utilized to access the right internal jugular vein under direct, real-time ultrasound guidance after the overlying soft tissues were anesthetized with 1% lidocaine with epinephrine. Ultrasound image documentation was performed. The microwire was kinked to measure appropriate catheter length. A stiff Glidewire was advanced to the level of the IVC and the micropuncture sheath was exchanged for a peel-away sheath. A palindrome tunneled hemodialysis catheter measuring 19 cm from tip to cuff was tunneled in a retrograde fashion from the anterior chest wall to the venotomy incision. The catheter was then placed through the peel-away sheath with tips ultimately positioned within the superior aspect of the right atrium. Final catheter positioning was confirmed and documented with a spot radiographic image. The catheter aspirates and flushes normally. The catheter was flushed with appropriate volume heparin dwells. The catheter exit site was secured with a  0-Prolene retention suture. The venotomy incision was closed with Dermabond. Dressings were applied. The patient tolerated the procedure well without immediate post procedural complication. Following successful placement of the new tunneled hemodialysis catheter, the existing non tunneled temporary HD catheter was removed and hemostasis attained by manual pressure. IMPRESSION: Successful placement of 19 cm tip to cuff tunneled hemodialysis catheter via the right internal jugular vein with tips terminating within the superior aspect of the right atrium. The catheter is ready for immediate use. Electronically Signed   By: Jacqulynn Cadet M.D.   On: 05/28/2020 16:18   IR US Guide Vasc Access Right  Result Date: 05/28/2020 INDICATION: 69 year old male with acute kidney injury requiring hemodialysis. He currently has a temporary right IJ HD catheter in place which was placed on 05/12/2020. he presents for placement of a more durable tunneled hemodialysis catheter. EXAM: TUNNELED CENTRAL VENOUS HEMODIALYSIS CATHETER PLACEMENT WITH ULTRASOUND AND FLUOROSCOPIC GUIDANCE MEDICATIONS: 1 g vancomycin. The antibiotic was given in an appropriate time interval prior to skin puncture. ANESTHESIA/SEDATION: Moderate (conscious) sedation was employed during this procedure. A total of Versed 1 mg and Fentanyl 25 mcg was administered intravenously. Moderate Sedation Time: 20 minutes. The patient's level of consciousness and vital signs were monitored continuously by radiology nursing throughout the procedure under my direct supervision. FLUOROSCOPY TIME:  Fluoroscopy Time: 0 minutes 36 seconds (2 mGy). COMPLICATIONS: None immediate. PROCEDURE: Informed written consent was obtained from the patient after a discussion of the risks, benefits, and alternatives to treatment. Questions regarding the  procedure were encouraged and answered. The right neck and chest were prepped with chlorhexidine in a sterile fashion, and a sterile drape  was applied covering the operative field. Maximum barrier sterile technique with sterile gowns and gloves were used for the procedure. A timeout was performed prior to the initiation of the procedure. After creating a small venotomy incision, a micropuncture kit was utilized to access the right internal jugular vein under direct, real-time ultrasound guidance after the overlying soft tissues were anesthetized with 1% lidocaine with epinephrine. Ultrasound image documentation was performed. The microwire was kinked to measure appropriate catheter length. A stiff Glidewire was advanced to the level of the IVC and the micropuncture sheath was exchanged for a peel-away sheath. A palindrome tunneled hemodialysis catheter measuring 19 cm from tip to cuff was tunneled in a retrograde fashion from the anterior chest wall to the venotomy incision. The catheter was then placed through the peel-away sheath with tips ultimately positioned within the superior aspect of the right atrium. Final catheter positioning was confirmed and documented with a spot radiographic image. The catheter aspirates and flushes normally. The catheter was flushed with appropriate volume heparin dwells. The catheter exit site was secured with a 0-Prolene retention suture. The venotomy incision was closed with Dermabond. Dressings were applied. The patient tolerated the procedure well without immediate post procedural complication. Following successful placement of the new tunneled hemodialysis catheter, the existing non tunneled temporary HD catheter was removed and hemostasis attained by manual pressure. IMPRESSION: Successful placement of 19 cm tip to cuff tunneled hemodialysis catheter via the right internal jugular vein with tips terminating within the superior aspect of the right atrium. The catheter is ready for immediate use. Electronically Signed   By: Jacqulynn Cadet M.D.   On: 05/28/2020 16:18   DG Chest Port 1 View  Result Date:  06/10/2020 CLINICAL DATA:  Dizziness and shortness of breath.  Dialysis today. EXAM: PORTABLE CHEST 1 VIEW COMPARISON:  None. FINDINGS: Right-sided dialysis catheter tip in the mid SVC. Heart is normal in size. Normal mediastinal contours. No pulmonary edema. No focal airspace disease, large pleural effusion or pneumothorax. No acute osseous abnormalities are seen. IMPRESSION: No acute chest findings. Electronically Signed   By: Keith Rake M.D.   On: 06/10/2020 18:18      Subjective: No issues overnight.  Discharge Exam: Vitals:   06/13/20 0457 06/13/20 0750  BP: 118/74 101/65  Pulse: 75 81  Resp: 20 18  Temp: 98.5 F (36.9 C) 97.7 F (36.5 C)  SpO2: 99% 100%   Vitals:   06/12/20 1935 06/12/20 2320 06/13/20 0457 06/13/20 0750  BP: 119/78 118/60 118/74 101/65  Pulse: 73 71 75 81  Resp: _0 Temp: 98.7 F (37.1 C) 98.3 F (36.8 C) 98.5 F (36.9 C) 97.7 F (36.5 C)  TempSrc: Oral Oral Oral Oral  SpO2: 98% 97% 99% 100%  Weight:      Height:        General: Pt is alert, awake, not in acute distress Cardiovascular: RRR, S1/S2 +, no rubs, no gallops Respiratory: CTA bilaterally, no wheezing, no rhonchi Abdominal: Soft, NT, ND, bowel sounds + Extremities: no edema, no cyanosis    The results of significant diagnostics from this hospitalization (including imaging, microbiology, ancillary and laboratory) are listed below for reference.     Microbiology: Recent Results (from the past 240 hour(s))  SARS Coronavirus 2 by RT PCR (hospital order, performed in [facility] hospital lab) Nasopharyngeal Nasopharyngeal Swab  Status: None   Collection Time: 06/10/20  6:32 PM   Specimen: Nasopharyngeal Swab  Result Value Ref Range Status   SARS Coronavirus 2 NEGATIVE NEGATIVE Final    Comment: (NOTE) SARS-CoV-2 target nucleic acids are NOT DETECTED.  The SARS-CoV-2 RNA is generally detectable in upper and lower respiratory specimens during the acute phase of  infection. The lowest concentration of SARS-CoV-2 viral copies this assay can detect is 250 copies / mL. A negative result does not preclude SARS-CoV-2 infection and should not be used as the sole basis for treatment or other patient management decisions.  A negative result may occur with improper specimen collection / handling, submission of specimen other than nasopharyngeal swab, presence of viral mutation(s) within the areas targeted by this assay, and inadequate number of viral copies (<250 copies / mL). A negative result must be combined with clinical observations, patient history, and epidemiological information.  Fact Sheet for Patients:   StrictlyIdeas.no  Fact Sheet for Healthcare Providers: BankingDealers.co.za  This test is not yet approved or  cleared by the [place] FDA and has been authorized for detection and/or diagnosis of SARS-CoV-2 by FDA under an Emergency Use Authorization (EUA).  This EUA will remain in effect (meaning this test can be used) for the duration of the COVID-19 declaration under Section 564(b)(1) of the Act, 21 U.S.C. section 360bbb-3(b)(1), unless the authorization is terminated or revoked sooner.  Performed at [facility], [place] [address], [place], [place] [address]   Urine culture     Status: Abnormal   Collection Time: 06/10/20 10:32 PM   Specimen: Urine, Random  Result Value Ref Range Status   Specimen Description URINE, RANDOM  Final   Special Requests   Final    NONE Performed at [facility], [place] [address] [place], [place] [address]    Culture >=100,000 COLONIES/mL PSEUDOMONAS AERUGINOSA (A)  Final   Report Status 06/12/2020 FINAL  Final   Organism ID, Bacteria PSEUDOMONAS AERUGINOSA (A)  Final      Susceptibility   Pseudomonas aeruginosa - MIC*    CEFTAZIDIME 4 SENSITIVE Sensitive     CIPROFLOXACIN <=0.25 SENSITIVE Sensitive     GENTAMICIN <=1 SENSITIVE Sensitive       IMIPENEM 2 SENSITIVE Sensitive     PIP/TAZO 16 SENSITIVE Sensitive     CEFEPIME 2 SENSITIVE Sensitive     * >=100,000 COLONIES/mL PSEUDOMONAS AERUGINOSA  Culture, blood (Routine X 2) w Reflex to ID Panel     Status: None (Preliminary result)   Collection Time: 06/11/20  8:37 AM   Specimen: BLOOD  Result Value Ref Range Status   Specimen Description BLOOD RIGHT ANTECUBITAL  Final   Special Requests   Final    BOTTLES DRAWN AEROBIC AND ANAEROBIC Blood Culture adequate volume   Culture   Final    NO GROWTH 2 DAYS Performed at [facility], [address], [place], [place] [address]    Report Status PENDING  Incomplete  Culture, blood (Routine X 2) w Reflex to ID Panel     Status: None (Preliminary result)   Collection Time: 06/11/20  9:17 AM   Specimen: BLOOD RIGHT HAND  Result Value Ref Range Status   Specimen Description BLOOD RIGHT HAND  Final   Special Requests   Final    BOTTLES DRAWN AEROBIC AND ANAEROBIC Blood Culture results may not be optimal due to an excessive volume of blood received in culture bottles   Culture   Final  NO GROWTH 2 DAYS Performed at [facility], [place] [address], [place], [place] [address]    Report Status PENDING  Incomplete     Labs: BNP (last 3 results) Recent Labs    06/10/20 1825  BNP 47.8   Basic Metabolic Panel: Recent Labs  Lab 06/10/20 1156 06/11/20 0837 06/12/20 0644  NA 137 138 137  K 3.7 3.9 3.6  CL 96* 98 99  CO2 _0 GLUCOSE 94 115* 87  BUN 26* 34* 51*  CREATININE 6.20* 7.64* 8.22*  CALCIUM 8.1* 8.0* 7.5*  MG  --  2.6*  --   PHOS  --   --  7.2*   Liver Function Tests: Recent Labs  Lab 06/11/20 0837 06/12/20 0644  AST 15  --   ALT 12  --   ALKPHOS 70  --   BILITOT 0.5  --   PROT 6.2*  --   ALBUMIN 3.1* 2.9*   No results for input(s): LIPASE, AMYLASE in the last 168 hours. No results for input(s): AMMONIA in the last 168 hours. CBC: Recent Labs  Lab 06/10/20 1156 06/11/20 0837  06/12/20 0644  WBC 11.9* 11.7* 11.2*  NEUTROABS  --  8.8*  --   HGB 11.6* 10.9* 10.2*  HCT 38.2* 36.4* 33.6*  MCV 95.0 94.3 93.3  PLT 209 196 203   Cardiac Enzymes: No results for input(s): CKTOTAL, CKMB, CKMBINDEX, TROPONINI in the last 168 hours. BNP: Invalid input(s): POCBNP CBG: No results for input(s): GLUCAP in the last 168 hours. D-Dimer Recent Labs    06/10/20 1825  DDIMER 2.88*   Hgb A1c No results for input(s): HGBA1C in the last 72 hours. Lipid Profile No results for input(s): CHOL, HDL, LDLCALC, TRIG, CHOLHDL, LDLDIRECT in the last 72 hours. Thyroid function studies No results for input(s): TSH, T4TOTAL, T3FREE, THYROIDAB in the last 72 hours.  Invalid input(s): FREET3 Anemia work up No results for input(s): VITAMINB12, FOLATE, FERRITIN, TIBC, IRON, RETICCTPCT in the last 72 hours. Urinalysis    Component Value Date/Time   COLORURINE AMBER (A) 06/10/2020 2143   APPEARANCEUR CLOUDY (A) 06/10/2020 2143   LABSPEC 1.011 06/10/2020 2143   PHURINE 9.0 (H) 06/10/2020 2143   GLUCOSEU NEGATIVE 06/10/2020 2143   HGBUR LARGE (A) 06/10/2020 2143   BILIRUBINUR NEGATIVE 06/10/2020 2143   KETONESUR 5 (A) 06/10/2020 2143   PROTEINUR 100 (A) 06/10/2020 2143   NITRITE NEGATIVE 06/10/2020 2143   LEUKOCYTESUR LARGE (A) 06/10/2020 2143   Sepsis Labs Invalid input(s): PROCALCITONIN,  WBC,  LACTICIDVEN Microbiology Recent Results (from the past 240 hour(s))  SARS Coronavirus 2 by RT PCR (hospital order, performed in [place] hospital lab) Nasopharyngeal Nasopharyngeal Swab     Status: None   Collection Time: 06/10/20  6:32 PM   Specimen: Nasopharyngeal Swab  Result Value Ref Range Status   SARS Coronavirus 2 NEGATIVE NEGATIVE Final    Comment: (NOTE) SARS-CoV-2 target nucleic acids are NOT DETECTED.  The SARS-CoV-2 RNA is generally detectable in upper and lower respiratory specimens during the acute phase of infection. The lowest concentration of SARS-CoV-2 viral  copies this assay can detect is 250 copies / mL. A negative result does not preclude SARS-CoV-2 infection and should not be used as the sole basis for treatment or other patient management decisions.  A negative result may occur with improper specimen collection / handling, submission of specimen other than nasopharyngeal swab, presence of viral mutation(s) within the areas targeted by this assay, and inadequate number  of viral copies (<250 copies / mL). A negative result must be combined with clinical observations, patient history, and epidemiological information.  Fact Sheet for Patients:   StrictlyIdeas.no  Fact Sheet for Healthcare Providers: BankingDealers.co.za  This test is not yet approved or  cleared by the [place] FDA and has been authorized for detection and/or diagnosis of SARS-CoV-2 by FDA under an Emergency Use Authorization (EUA).  This EUA will remain in effect (meaning this test can be used) for the duration of the COVID-19 declaration under Section 564(b)(1) of the Act, 21 U.S.C. section 360bbb-3(b)(1), unless the authorization is terminated or revoked sooner.  Performed at [facility], [place] [address] [place], [place] [address]   Urine culture     Status: Abnormal   Collection Time: 06/10/20 10:32 PM   Specimen: Urine, Random  Result Value Ref Range Status   Specimen Description URINE, RANDOM  Final   Special Requests   Final    NONE Performed at [facility], [place] [address], [place], [place] [address]    Culture >=100,000 COLONIES/mL PSEUDOMONAS AERUGINOSA (A)  Final   Report Status 06/12/2020 FINAL  Final   Organism ID, Bacteria PSEUDOMONAS AERUGINOSA (A)  Final      Susceptibility   Pseudomonas aeruginosa - MIC*    CEFTAZIDIME 4 SENSITIVE Sensitive     CIPROFLOXACIN <=0.25 SENSITIVE Sensitive     GENTAMICIN <=1 SENSITIVE Sensitive     IMIPENEM 2 SENSITIVE Sensitive     PIP/TAZO 16  SENSITIVE Sensitive     CEFEPIME 2 SENSITIVE Sensitive     * >=100,000 COLONIES/mL PSEUDOMONAS AERUGINOSA  Culture, blood (Routine X 2) w Reflex to ID Panel     Status: None (Preliminary result)   Collection Time: 06/11/20  8:37 AM   Specimen: BLOOD  Result Value Ref Range Status   Specimen Description BLOOD RIGHT ANTECUBITAL  Final   Special Requests   Final    BOTTLES DRAWN AEROBIC AND ANAEROBIC Blood Culture adequate volume   Culture   Final    NO GROWTH 2 DAYS Performed at [facility], [address], [place], [place] [address]    Report Status PENDING  Incomplete  Culture, blood (Routine X 2) w Reflex to ID Panel     Status: None (Preliminary result)   Collection Time: 06/11/20  9:17 AM   Specimen: BLOOD RIGHT HAND  Result Value Ref Range Status   Specimen Description BLOOD RIGHT HAND  Final   Special Requests   Final    BOTTLES DRAWN AEROBIC AND ANAEROBIC Blood Culture results may not be optimal due to an excessive volume of blood received in culture bottles   Culture   Final    NO GROWTH 2 DAYS Performed at [facility], [place] [address], [place], [place] [address]    Report Status PENDING  Incomplete     Time coordinating discharge: 30 minutes  SIGNED:   Cordelia Poche, MD [facility] 06/13/2020, 11:19 AM

## 2020-06-13 NOTE — Progress Notes (Signed)
Subjective:  Feels better, no cos ,tolertaed HD yesterday on schedle  Objective Vital signs in last 24 hours: Vitals:   06/12/20 1935 06/12/20 2320 06/13/20 0457 06/13/20 0750  BP: 119/78 118/60 118/74 101/65  Pulse: 73 71 75 81  Resp: 18 20 20 18   Temp: 98.7 F (37.1 C) 98.3 F (36.8 C) 98.5 F (36.9 C) 97.7 F (36.5 C)  TempSrc: Oral Oral Oral Oral  SpO2: 98% 97% 99% 100%  Weight:      Height:       Weight change: -0.2 kg  Physical Exam: General: Alert pleasant well-developed well-nourished male NAD Heart: RRR, no MRG Lungs: CTA nonlabored breathing room air oxygen Abdomen: Bowel sounds normoactive, ND, NT no HSM  Extremities: No pedal edema Dialysis Access: IJ PermCath dressing dry and clear nontender  200 cc uop this am  700 cc uop yest .997 cc uf hd= 8/31 750 cc uop   8/30   OP Dialysis Orders: Center: [facility] on TTS 3 hours 45 minutes 99.0 kg  last 3 treatments post weight 97.5 and 97.7 and tolerated, 3K, 2.5 calcium bath, Access right IJ PermCath, Heparin 2000 units, Venofer 50 mg q. weekly, Mircera 150 MCG given every every 2 weeks last given 06/02/2020   Problem/Plan: 1. AKI(2/2 struct of uropathy BPH indwelling catheter/currently on hemodialysis TTS= next dialysis tomorrow on schedule, monitor I's and O's and labs scr 8.22 bun 51  Pre hd yest. 2. Pseudomonas Urinary tract infection with generalized weakness dizziness= BC NGTD , symptoms resolved , on Cefepime  IV antibiotics per admit team urology  FU as OP  . Have apt 06/27/20   Dr Alinda Money  3. Hypertension/volume  -BP stable not requiring meds, no excess volume 4. Anemia  -Hgb 10.2, last Mircera 150 given 8/21, follow-up hemoglobin trend hold weekly iron with infection 5. Metabolic bone disease -calcium corrected 8.7, phosphorus 2.4 to 7.2 ?? No binder as op fu trend start t as Op if still ^ , no    vitamin D 6. Nutrition -Albumin 2.9  renal diet renal vitamin add nepro  7. History of depression on  Seroquel/Prozac and Prozac= meds per admitting   Manuel Haber, PA-C [place] [phone] 06/13/2020,11:15 AM  LOS: 1 day   Labs: Basic Metabolic Panel: Recent Labs  Lab 06/10/20 1156 06/11/20 0837 06/12/20 0644  NA 137 138 137  K 3.7 3.9 3.6  CL 96* 98 99  CO2 25 25 22   GLUCOSE 94 115* 87  BUN 26* 34* 51*  CREATININE 6.20* 7.64* 8.22*  CALCIUM 8.1* 8.0* 7.5*  PHOS  --   --  7.2*   Liver Function Tests: Recent Labs  Lab 06/11/20 0837 06/12/20 0644  AST 15  --   ALT 12  --   ALKPHOS 70  --   BILITOT 0.5  --   PROT 6.2*  --   ALBUMIN 3.1* 2.9*   No results for input(s): LIPASE, AMYLASE in the last 168 hours. No results for input(s): AMMONIA in the last 168 hours. CBC: Recent Labs  Lab 06/10/20 1156 06/11/20 0837 06/12/20 0644  WBC 11.9* 11.7* 11.2*  NEUTROABS  --  8.8*  --   HGB 11.6* 10.9* 10.2*  HCT 38.2* 36.4* 33.6*  MCV 95.0 94.3 93.3  PLT 209 196 203   Cardiac Enzymes: No results for input(s): CKTOTAL, CKMB, CKMBINDEX, TROPONINI in the last 168 hours. CBG: No results for input(s): GLUCAP in the last 168 hours.  Studies/Results: No results  found. Medications:  [START ON 06/14/2020] ceFEPime (MAXIPIME) IV      Chlorhexidine Gluconate Cloth  6 each Topical Daily   divalproex  125 mg Oral BID   docusate sodium  100 mg Oral BID   feeding supplement (NEPRO CARB STEADY)  237 mL Oral BID BM   FLUoxetine  40 mg Oral Daily   latanoprost  1 drop Both Eyes QHS   melatonin  5 mg Oral QHS   tamsulosin  0.8 mg Oral QPC supper   timolol  1 drop Both Eyes BID

## 2020-06-13 NOTE — TOC Progression Note (Addendum)
Transition of Care [facility]) - Progression Note    Patient Details  Name: Manuel Ortega MRN: [id number] Date of Birth: 1951-03-30  Transition of Care [facility]) CM/SW [place], LCSW Phone Number: 06/13/2020, 12:58 PM  Clinical Narrative:    CSW and Renal Navigator, Jaclyn Shaggy, spoke with patient and addressed his request to switch dialysis centers. Colleen explained to him that the center he is set at currently will only be a few miles away from his house, though it currently takes the SNF longer to take him there because it is farther away. He expressed understanding and stated he liked his current clinic. He requested CSW ask MD for an increased dose of Ambien for sleep. RN messaged MD about it. CSW placed information on AVS for [facility] for patient to arrange a PCP once he discharges from SNF. [place] is ready for patient to return today.    Expected Discharge Plan: [place] Barriers to Discharge: Barriers Resolved  Expected Discharge Plan and Services Expected Discharge Plan: [place] In-house Referral: Clinical Social Work   Post Acute Care Choice: [place] Living arrangements for the past 2 months: [place], [place] Expected Discharge Date: 06/13/20                                     Social Determinants of Health (SDOH) Interventions    Readmission Risk Interventions Readmission Risk Prevention Plan 06/12/2020 05/30/2020  Transportation Screening Complete Complete  PCP or Specialist Appt within 3-5 Days Complete Complete  HRI or Home Care Consult Complete Complete  Social Work Consult for [place] Planning/Counseling Complete Complete  Palliative Care Screening Not Applicable Not Applicable  Medication Review Press photographer) Referral to Pharmacy Complete

## 2020-06-13 NOTE — Discharge Instructions (Signed)

## 2020-06-13 NOTE — Progress Notes (Signed)
Rn gave report to receiving facility Nurse. Patient transported via [place] with medical personnel. Patient alert and oriented and no c/o pain at the time of discharged. Patient with all his belongings during discharged.

## 2020-06-13 NOTE — Progress Notes (Signed)
Physical Therapy Treatment Patient Details Name: Manuel Ortega MRN: [id number] DOB: Jul 26, 1951 Today's Date: 06/13/2020    History of Present Illness Pt is a 69 yo male who presents to the hospital with malaise, fatigue, and generalized weakness due to a UTI. Pt was noted to have hematuria and a recent AKI 05/13/2020. Pt was admitted from [facility] and is currently on hemodialysis for T,Th, and Saturday. Pt has a PMH of HTn and depression    PT Comments    Pt progressing slowly towards physical therapy goals. Was able to perform transfers and ambulation with gross supervision for safety and RW for support. Pt reports feeling very fatigued today and states he did not sleep at all last night. He required a standing rest break after every 2 steps he took during gait training today. Based on performance today, tolerance for functional activity is the main limiting factor and SNF level rehab remains appropriate at this time. Will continue to follow.     Follow Up Recommendations  SNF     Equipment Recommendations  None recommended by PT    Recommendations for Other Services OT consult     Precautions / Restrictions Precautions Precautions: Fall Restrictions Weight Bearing Restrictions: No    Mobility  Bed Mobility Overal bed mobility: Modified Independent Bed Mobility: Supine to Sit           General bed mobility comments: Pt was able to transition to EOB without assistance. Increased time required.   Transfers Overall transfer level: Needs assistance Equipment used: None Transfers: Sit to/from Stand Sit to Stand: Supervision         General transfer comment: Light supervision for safety. No assist required to power-up to full stand and gain/maintain balance.   Ambulation/Gait Ambulation/Gait assistance: Supervision Gait Distance (Feet): 80 Feet Assistive device: Rolling walker (2 wheeled) Gait Pattern/deviations: Decreased stride length;Step-through  pattern;Trunk flexed Gait velocity: decreased Gait velocity interpretation: <1.31 ft/sec, indicative of household ambulator General Gait Details: Supervision for safety as pt reports lightheadedness. Pt moving extremely slow and requiring a standing rest break after every 2 steps. Distance limited due to fatigue - pt states "I'm ready to turn around, I'm exhausted."   Stairs             Wheelchair Mobility    Modified Rankin (Stroke Patients Only)       Balance Overall balance assessment: Needs assistance Sitting-balance support: No upper extremity supported;Feet supported Sitting balance-Leahy Scale: Fair Sitting balance - Comments: requires UE support as position of comfort   Standing balance support: Bilateral upper extremity supported;No upper extremity supported;During functional activity Standing balance-Leahy Scale: Fair Standing balance comment: pt was able to statically stand without UE support; however, he required BUE support on RW with dyanmic mobility                            Cognition Arousal/Alertness: Awake/alert Behavior During Therapy: Flat affect Overall Cognitive Status: Within Functional Limits for tasks assessed                                        Exercises      General Comments        Pertinent Vitals/Pain Pain Assessment: Faces Faces Pain Scale: Hurts a little bit Pain Location: General moaning throughout mobility Pain Descriptors / Indicators: Moaning Pain Intervention(s): Limited activity within  patient's tolerance;Monitored during session;Repositioned    Home Living                      Prior Function            PT Goals (current goals can now be found in the care plan section) Acute Rehab PT Goals Patient Stated Goal: to improve endurance and tolerance for activity PT Goal Formulation: With patient Time For Goal Achievement: 06/25/20 Potential to Achieve Goals: Good Progress towards  PT goals: Progressing toward goals    Frequency    Min 2X/week      PT Plan Current plan remains appropriate    Co-evaluation              AM-PAC PT "6 Clicks" Mobility   Outcome Measure  Help needed turning from your back to your side while in a flat bed without using bedrails?: None Help needed moving from lying on your back to sitting on the side of a flat bed without using bedrails?: None Help needed moving to and from a bed to a chair (including a wheelchair)?: None Help needed standing up from a chair using your arms (e.g., wheelchair or bedside chair)?: None Help needed to walk in hospital room?: None Help needed climbing 3-5 steps with a railing? : A Lot 6 Click Score: 22    End of Session Equipment Utilized During Treatment: Gait belt Activity Tolerance: Patient limited by fatigue Patient left: in chair;with call bell/phone within reach Nurse Communication: Mobility status PT Visit Diagnosis: Unsteadiness on feet (R26.81);Muscle weakness (generalized) (M62.81)     Time: 8242-3536 PT Time Calculation (min) (ACUTE ONLY): 18 min  Charges:  $Gait Training: 8-22 mins                     Rolinda Roan, PT, DPT Acute Rehabilitation Services Pager: [phone] Office: [phone]    Thelma Comp 06/13/2020, 9:11 AM

## 2020-06-13 NOTE — TOC Transition Note (Signed)
Transition of Care [facility]) - CM/SW Discharge Note   Patient Details  Name: Manuel Ortega MRN: [id number] Date of Birth: 1950/12/04  Transition of Care [facility]) CM/SW Contact:  Benard Halsted, LCSW Phone Number: 06/13/2020, 1:01 PM   Clinical Narrative:    Patient will DC to: [place] Anticipated DC date: 06/13/20: Family notified: Pt notifying family Transport by: Corey Harold   Per MD patient ready for DC to [facility]. RN to call report prior to discharge [phone] Room 112). RN, patient, patient's family, and facility notified of DC. Discharge Summary and FL2 sent to facility. DC packet on chart. Ambulance transport requested for patient.   CSW will sign off for now as social work intervention is no longer needed. Please consult [place] again if new needs arise.      Final next level of care: Skilled Nursing Facility Barriers to Discharge: Barriers Resolved   Patient Goals and CMS Choice Patient states their goals for this hospitalization and ongoing recovery are:: Return to SNF CMS Medicare.gov Compare Post Acute Care list provided to:: Patient Choice offered to / list presented to : Patient  Discharge Placement   Existing PASRR number confirmed : 06/13/20          Patient chooses bed at: [facility] Patient to be transferred to facility by: [place] Name of family member notified: Pt to notify family Patient and family notified of of transfer: 06/13/20  Discharge Plan and Services In-house Referral: Clinical Social Work   Post Acute Care Choice: [place]                               Social Determinants of Health (SDOH) Interventions     Readmission Risk Interventions Readmission Risk Prevention Plan 06/12/2020 05/30/2020  Transportation Screening Complete Complete  PCP or Specialist Appt within 3-5 Days Complete Complete  HRI or Home Care Consult Complete Complete  Social Work Consult for [place] Planning/Counseling Complete  Complete  Palliative Care Screening Not Applicable Not Applicable  Medication Review Press photographer) Referral to Pharmacy Complete

## 2020-06-13 NOTE — Progress Notes (Signed)
Renal Navigator met with patient to inform him that nothing is needed for him to continue [facility] transportation. Navigator had also been informed by CSW that patient was requesting an OP HD clinic closer to home once he is discharged from SNF. Renal Navigator explained to patient that he is [place]mi from [place] (current clinic) and 19mi from [facility] (closest to his home at discharge from [facility]). He did not realize that he would be much closer to his OP HD clinic from his home than he currently is from the SNF. He confirms that he does not want to switch clinics. Navigator explained that if he ever wants to change clinics, that he can request this of staff at [facility], and it will depend on seat availability at the clinic he is requesting. He states the staff take good care of him at [facility] and he does not anticipate wanting to change. He asked about transportation resumption. Navigator arranged his transportation for his next 3 HD appointments (9/2, 9/4, 9/7) with [facility] and confirmed with patient who is appreciative. They will pick up from [facility] between 10:00-10:30am and at [facility] clinic between 4:15-4:45pm.  Alphonzo Cruise, [place] Renal Navigator [phone]

## 2020-06-16 LAB — CULTURE, BLOOD (ROUTINE X 2)
Culture: NO GROWTH
Culture: NO GROWTH
Special Requests: ADEQUATE

## 2020-06-26 DIAGNOSIS — D509 Iron deficiency anemia, unspecified: Secondary | ICD-10-CM | POA: Insufficient documentation

## 2020-07-16 DIAGNOSIS — U071 COVID-19: Secondary | ICD-10-CM | POA: Insufficient documentation

## 2020-07-30 DIAGNOSIS — N2581 Secondary hyperparathyroidism of renal origin: Secondary | ICD-10-CM | POA: Insufficient documentation

## 2020-08-14 ENCOUNTER — Other Ambulatory Visit: Payer: Self-pay | Admitting: Urology

## 2020-08-30 NOTE — Progress Notes (Addendum)
PCP - NO PCP  Cardiologist - no Dialysis- Tue,thu, Sat   PPM/ICD -  Device Orders -  Rep Notified -   Chest x-ray - 06-10-20 epic EKG - 06-11-20 epic Stress Test -  ECHO -  Cardiac Cath -   Sleep Study -  CPAP -   Fasting Blood Sugar -  Checks Blood Sugar _____ times a day  Blood Thinner Instructions: Aspirin Instructions:  ERAS Protcol - PRE-SURGERY   Fully vaccinated  Last dose March had booster 08-19-20 COVID TEST- 09-10-20 epic  Activity-Can walk a flight of stairs without SOB, Does own housework. SOB after Dialysis normal for pt. Per pt. Anesthesia review: Dialysis tue, sat ,sat., Labs DOS  Patient denies shortness of breath, fever, cough and chest pain at PAT appointment  SOB says its allergies and he just left Dialysis. Willeen Cass ,NP  spoke with pt. At preop pt. States," SOB is normal after Dialysis."    All instructions explained to the patient, with a verbal understanding of the material. Patient agrees to go over the instructions while at home for a better understanding. Patient also instructed to self quarantine after being tested for COVID-19. The opportunity to ask questions was provided.

## 2020-08-30 NOTE — Patient Instructions (Addendum)
DUE TO COVID-19 ONLY ONE VISITOR IS ALLOWED TO COME WITH YOU AND STAY IN THE WAITING ROOM ONLY DURING PRE OP AND PROCEDURE DAY OF SURGERY. THE 1 VISITOR  MAY VISIT WITH YOU AFTER SURGERY IN YOUR PRIVATE ROOM DURING VISITING HOURS ONLY!  YOU NEED TO HAVE A COVID 19 TEST ON_11-29-21______ @_______ , THIS TEST MUST BE DONE BEFORE SURGERY,  COVID TESTING SITE [address]  [address], IT IS ON THE RIGHT GOING OUT [address] APPROXIMATELY  2 MINUTES PAST [employer] ON THE RIGHT. ONCE YOUR COVID TEST IS COMPLETED,  PLEASE BEGIN THE QUARANTINE INSTRUCTIONS AS OUTLINED IN YOUR HANDOUT.                Manuel Ortega  08/30/2020   Your procedure is scheduled on: 09-13-20   Report to [facility] Main  Entrance   Report to admitting at       0930  AM     Call this number if you have problems the morning of surgery [phone]    Remember: Do not eat food :After Midnight. You may have clear liquids until  0830 am then nothing by mouth     CLEAR LIQUID DIET   Foods Allowed                                                                          Black Coffee and tea, regular and decaf                              Plain Jell-O any favor except red or purple                                     Fruit ices (not with fruit pulp)                                                     Iced Popsicles                                                        Carbonated beverages, regular and diet                                    Cranberry, grape and apple juices Sports drinks like Gatorade Lightly seasoned clear broth or consume(fat free) Sugar, honey syrup   _____________________________________________________________________      BRUSH YOUR TEETH MORNING OF SURGERY AND RINSE YOUR MOUTH OUT, NO CHEWING GUM CANDY OR MINTS.     Take these medicines the morning of surgery with A SIP OF WATER: eye drops as usual  You may not  have any metal on your body including hair pins and              piercings  Do not wear jewelry,  lotions, powders or perfumes, deodorant                        Men may shave face and neck.   Do not bring valuables to the hospital. [place].  Contacts, dentures or bridgework may not be worn into surgery.      Patients discharged the day of surgery will not be allowed to drive home. IF YOU ARE HAVING SURGERY AND GOING HOME THE SAME DAY, YOU MUST HAVE AN ADULT TO DRIVE YOU HOME AND BE WITH YOU FOR 24 HOURS. YOU MAY GO HOME BY TAXI OR UBER OR ORTHERWISE, BUT AN ADULT MUST ACCOMPANY YOU HOME AND STAY WITH YOU FOR 24 HOURS.  Name and phone number of your driver:  Special Instructions: N/A              Please read over the following fact sheets you were given: _____________________________________________________________________             [facility] - Preparing for Surgery Before surgery, you can play an important role.  Because skin is not sterile, your skin needs to be as free of germs as possible.  You can reduce the number of germs on your skin by washing with CHG (chlorahexidine gluconate) soap before surgery.  CHG is an antiseptic cleaner which kills germs and bonds with the skin to continue killing germs even after washing. Please DO NOT use if you have an allergy to CHG or antibacterial soaps.  If your skin becomes reddened/irritated stop using the CHG and inform your nurse when you arrive at Short Stay. Do not shave (including legs and underarms) for at least 48 hours prior to the first CHG shower.  You may shave your face/neck. Please follow these instructions carefully:  1.  Shower with CHG Soap the night before surgery and the  morning of Surgery.  2.  If you choose to wash your hair, wash your hair first as usual with your  normal  shampoo.  3.  After you shampoo, rinse your hair and body thoroughly to remove the  shampoo.                            4.  Use CHG as you would any other liquid soap.  You can apply chg directly  to the skin and wash                       Gently with a scrungie or clean washcloth.  5.  Apply the CHG Soap to your body ONLY FROM THE NECK DOWN.   Do not use on face/ open                           Wound or open sores. Avoid contact with eyes, ears mouth and genitals (private parts).                       Wash face,  Genitals (private parts) with your normal soap.  6.  Wash thoroughly, paying special attention to the area where your surgery  will be performed.  7.  Thoroughly rinse your body with warm water from the neck down.  8.  DO NOT shower/wash with your normal soap after using and rinsing off  the CHG Soap.                9.  Pat yourself dry with a clean towel.            10.  Wear clean pajamas.            11.  Place clean sheets on your bed the night of your first shower and do not  sleep with pets. Day of Surgery : Do not apply any lotions/deodorants the morning of surgery.  Please wear clean clothes to the hospital/surgery center.  FAILURE TO FOLLOW THESE INSTRUCTIONS MAY RESULT IN THE CANCELLATION OF YOUR SURGERY PATIENT SIGNATURE_________________________________  NURSE SIGNATURE__________________________________  ________________________________________________________________________

## 2020-09-03 ENCOUNTER — Encounter (HOSPITAL_COMMUNITY)
Admission: RE | Admit: 2020-09-03 | Discharge: 2020-09-03 | Disposition: A | Discharge status: Home / Self Care | Payer: Medicare Other | Source: Ambulatory Visit | Attending: Urology | Admitting: Urology

## 2020-09-03 ENCOUNTER — Encounter (HOSPITAL_COMMUNITY): Payer: Self-pay

## 2020-09-03 ENCOUNTER — Other Ambulatory Visit: Payer: Self-pay

## 2020-09-03 DIAGNOSIS — Z01812 Encounter for preprocedural laboratory examination: Secondary | ICD-10-CM | POA: Diagnosis present

## 2020-09-03 HISTORY — DX: Anxiety disorder, unspecified: F41.9

## 2020-09-03 HISTORY — DX: Unspecified osteoarthritis, unspecified site: M19.90

## 2020-09-10 ENCOUNTER — Other Ambulatory Visit (HOSPITAL_COMMUNITY)
Admission: RE | Admit: 2020-09-10 | Discharge: 2020-09-10 | Disposition: A | Discharge status: Home / Self Care | Payer: Medicare Other | Source: Ambulatory Visit | Attending: Urology | Admitting: Urology

## 2020-09-10 DIAGNOSIS — Z01812 Encounter for preprocedural laboratory examination: Secondary | ICD-10-CM | POA: Insufficient documentation

## 2020-09-10 DIAGNOSIS — Z20822 Contact with and (suspected) exposure to covid-19: Secondary | ICD-10-CM | POA: Insufficient documentation

## 2020-09-10 LAB — SARS CORONAVIRUS 2 (TAT 6-24 HRS): SARS Coronavirus 2: NEGATIVE

## 2020-09-12 NOTE — H&P (Signed)
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 He continues receiving dialysis Tuesday, Thursday, and Saturdays.  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  He has elected to proceed with TURP.    ALLERGIES: Penicillin    MEDICATIONS: No Medications    GU PSH: Complex cystometrogram, w/ void pressure and urethral pressure profile studies, any technique - 07/18/2020 Complex Uroflow - 07/18/2020 Cystoscopy - 06/27/2020 Emg surf Electrd - 07/18/2020 Inject For cystogram - 07/18/2020 Insert Bladder Cath; Complex - 06/27/2020 Intrabd voidng Press - 07/18/2020     NON-GU PSH: No Non-GU PSH    GU PMH: Urinary Retention - 07/18/2020, - 06/27/2020 Elevated PSA - 06/27/2020 Gross hematuria - 06/27/2020 Acute kidney failure End-Stage Renal Disease      PMH Notes:   1) Urinary retention due to probably bladder outlet obstruction with obstructive uropathy: He presented in August 2021 with acute renal failure likely related to chronic bladder outlet obstruction. He remained dialysis dependent even after catheter drainage.   Oct 2021: Urodynamics - Obstructed, Good detrusor pressure but unable to void   2) Hematuria: This developed likely related to catheter trauma or distended bladder and decompression. However, he did require CBI during his initial hospitalization.   3) Elevated PSA: His PSA was noted to be elevated during his hospitalization although was checked after catheterization. DRE was normal.      NON-GU PMH: Anxiety Depression Glaucoma    FAMILY HISTORY: 1 Daughter - Runs in Family Hypertension - Mother   SOCIAL HISTORY: Marital Status: Divorced Current Smoking Status: Patient has never smoked.   Tobacco Use Assessment Completed: Used Tobacco in last 30 days? Has never drank.  Drinks 1 caffeinated drink per day.    REVIEW OF SYSTEMS:    GU Review Male:   Patient denies frequent urination,  hard to postpone urination, burning/ pain with urination, get up at night to urinate, leakage of urine, stream starts and stops, trouble starting your streams, and have to strain to urinate .  Gastrointestinal (Lower):   Patient denies diarrhea and constipation.  Gastrointestinal (Upper):   Patient denies nausea and vomiting.  Constitutional:   Patient denies fever, night sweats, weight loss, and fatigue.  Skin:   Patient denies skin rash/ lesion and itching.  Eyes:   Patient denies blurred vision and double vision.  Ears/ Nose/ Throat:   Patient denies sore throat and sinus problems.  Hematologic/Lymphatic:   Patient denies swollen glands and easy bruising.  Cardiovascular:   Patient denies leg swelling and chest pains.  Respiratory:   Patient denies cough and shortness of breath.  Endocrine:   Patient denies excessive thirst.  Musculoskeletal:   Patient denies back pain and joint pain.  Neurological:   Patient denies headaches and dizziness.  Psychologic:   Patient denies depression and anxiety.   VITAL SIGNS:     Weight 220 lb / 99.79 kg  Height 73 in / 185.42 cm  BMI 29.0 kg/m   MULTI-SYSTEM PHYSICAL EXAMINATION:    Constitutional: Well-nourished. No physical deformities. Normally developed. Good grooming.  Respiratory: No labored breathing, no use of accessory muscles. Clear bilaterally.  Cardiovascular: Normal temperature, normal extremity pulses, no swelling, no varicosities. Regular rate and rhythm.     Complexity of Data:  Records Review:   Previous Patient Records  Urodynamics Review:   Review Urodynamics Tests     ASSESSMENT:  ICD-10 Details  1 GU:   Urinary Retention - R33.8   2   BPH w/LUTS - N40.1    PLAN:         1. BPH/urinary retention: We reviewed his urodynamic study which demonstrates excellent detrusor function despite his inability to void. This is consistent with bladder outlet obstruction and his cystoscopic findings today due confirm BPH is the  most likely source. I have recommended that he consider a transurethral resection of the prostate considering the severity of his bladder outlet obstruction and his resultant renal failure. We have reviewed the potential risks, complications, and the expected recovery process associated with this procedure. He gives informed consent to proceed.

## 2020-09-13 ENCOUNTER — Encounter (HOSPITAL_COMMUNITY): Payer: Self-pay | Admitting: Urology

## 2020-09-13 ENCOUNTER — Ambulatory Visit (HOSPITAL_COMMUNITY): Payer: Medicare Other | Admitting: Physician Assistant

## 2020-09-13 ENCOUNTER — Encounter (HOSPITAL_COMMUNITY)
Admission: RE | Disposition: A | Discharge status: Home / Self Care | Payer: Self-pay | Source: Home / Self Care | Attending: Urology

## 2020-09-13 ENCOUNTER — Inpatient Hospital Stay (HOSPITAL_COMMUNITY)
Admission: RE | Admit: 2020-09-13 | Discharge: 2020-09-15 | DRG: 713 | Disposition: A | Discharge status: Home / Self Care | Payer: Medicare Other | Attending: Urology | Admitting: Urology

## 2020-09-13 ENCOUNTER — Ambulatory Visit (HOSPITAL_COMMUNITY): Payer: Medicare Other | Admitting: Certified Registered Nurse Anesthetist

## 2020-09-13 DIAGNOSIS — Z823 Family history of stroke: Secondary | ICD-10-CM

## 2020-09-13 DIAGNOSIS — M1990 Unspecified osteoarthritis, unspecified site: Secondary | ICD-10-CM | POA: Diagnosis present

## 2020-09-13 DIAGNOSIS — N401 Enlarged prostate with lower urinary tract symptoms: Principal | ICD-10-CM | POA: Diagnosis present

## 2020-09-13 DIAGNOSIS — N320 Bladder-neck obstruction: Secondary | ICD-10-CM | POA: Diagnosis present

## 2020-09-13 DIAGNOSIS — N186 End stage renal disease: Secondary | ICD-10-CM | POA: Diagnosis present

## 2020-09-13 DIAGNOSIS — Z992 Dependence on renal dialysis: Secondary | ICD-10-CM

## 2020-09-13 DIAGNOSIS — Z880 Allergy status to penicillin: Secondary | ICD-10-CM

## 2020-09-13 DIAGNOSIS — E875 Hyperkalemia: Secondary | ICD-10-CM | POA: Diagnosis not present

## 2020-09-13 DIAGNOSIS — C61 Malignant neoplasm of prostate: Secondary | ICD-10-CM | POA: Diagnosis present

## 2020-09-13 DIAGNOSIS — F32A Depression, unspecified: Secondary | ICD-10-CM | POA: Diagnosis present

## 2020-09-13 DIAGNOSIS — Z8249 Family history of ischemic heart disease and other diseases of the circulatory system: Secondary | ICD-10-CM

## 2020-09-13 DIAGNOSIS — Z79899 Other long term (current) drug therapy: Secondary | ICD-10-CM

## 2020-09-13 DIAGNOSIS — I120 Hypertensive chronic kidney disease with stage 5 chronic kidney disease or end stage renal disease: Secondary | ICD-10-CM | POA: Diagnosis present

## 2020-09-13 DIAGNOSIS — F419 Anxiety disorder, unspecified: Secondary | ICD-10-CM | POA: Diagnosis present

## 2020-09-13 DIAGNOSIS — Z20822 Contact with and (suspected) exposure to covid-19: Secondary | ICD-10-CM | POA: Diagnosis present

## 2020-09-13 DIAGNOSIS — H409 Unspecified glaucoma: Secondary | ICD-10-CM | POA: Diagnosis present

## 2020-09-13 DIAGNOSIS — N138 Other obstructive and reflux uropathy: Secondary | ICD-10-CM | POA: Diagnosis present

## 2020-09-13 DIAGNOSIS — R338 Other retention of urine: Secondary | ICD-10-CM | POA: Diagnosis present

## 2020-09-13 HISTORY — PX: TRANSURETHRAL RESECTION OF PROSTATE: SHX73

## 2020-09-13 HISTORY — DX: Dyspnea, unspecified: R06.00

## 2020-09-13 LAB — BASIC METABOLIC PANEL
Anion gap: 17 — ABNORMAL HIGH (ref 5–15)
Anion gap: 14 (ref 5–15)
BUN: 51 mg/dL — ABNORMAL HIGH (ref 8–23)
BUN: 54 mg/dL — ABNORMAL HIGH (ref 8–23)
CO2: 16 mmol/L — ABNORMAL LOW (ref 22–32)
CO2: 21 mmol/L — ABNORMAL LOW (ref 22–32)
Calcium: 8.6 mg/dL — ABNORMAL LOW (ref 8.9–10.3)
Calcium: 8 mg/dL — ABNORMAL LOW (ref 8.9–10.3)
Chloride: 104 mmol/L (ref 98–111)
Chloride: 105 mmol/L (ref 98–111)
Creatinine, Ser: 7.05 mg/dL — ABNORMAL HIGH (ref 0.61–1.24)
Creatinine, Ser: 7.06 mg/dL — ABNORMAL HIGH (ref 0.61–1.24)
GFR, Estimated: 8 mL/min — ABNORMAL LOW (ref >60)
GFR, Estimated: 8 mL/min — ABNORMAL LOW (ref >60)
Glucose, Bld: 77 mg/dL (ref 70–99)
Glucose, Bld: 94 mg/dL (ref 70–99)
Potassium: 5.3 mmol/L — ABNORMAL HIGH (ref 3.5–5.1)
Potassium: 4.3 mmol/L (ref 3.5–5.1)
Sodium: 137 mmol/L (ref 135–145)
Sodium: 140 mmol/L (ref 135–145)

## 2020-09-13 LAB — CBC
HCT: 36.1 % — ABNORMAL LOW (ref 39.0–52.0)
Hemoglobin: 11.6 g/dL — ABNORMAL LOW (ref 13.0–17.0)
MCH: 28.9 pg (ref 26.0–34.0)
MCHC: 32.1 g/dL (ref 30.0–36.0)
MCV: 89.8 fL (ref 80.0–100.0)
Platelets: 263 10*3/uL (ref 150–400)
RBC: 4.02 MIL/uL — ABNORMAL LOW (ref 4.22–5.81)
RDW: 15.9 % — ABNORMAL HIGH (ref 11.5–15.5)
WBC: 8 10*3/uL (ref 4.0–10.5)
nRBC: 0 % (ref 0.0–0.2)

## 2020-09-13 LAB — GLUCOSE, CAPILLARY: Glucose-Capillary: 81 mg/dL (ref 70–99)

## 2020-09-13 SURGERY — TURP (TRANSURETHRAL RESECTION OF PROSTATE)
Anesthesia: General | Site: Urethra

## 2020-09-13 MED ORDER — OXYCODONE HCL 5 MG PO TABS: 5.0000 mg | ORAL_TABLET | Freq: Once | ORAL | Status: AC | PRN

## 2020-09-13 MED ORDER — PROCHLORPERAZINE EDISYLATE 10 MG/2ML IJ SOLN: 5.0000 mg | INTRAMUSCULAR | Status: DC | PRN

## 2020-09-13 MED ORDER — SODIUM CHLORIDE 0.9 % IV SOLN
2.0000 g | INTRAVENOUS | Status: AC
  Administered 2020-09-13: 2 g via INTRAVENOUS
  Filled 2020-09-13: qty 20

## 2020-09-13 MED ORDER — ZOLPIDEM TARTRATE 5 MG PO TABS
5.0000 mg | ORAL_TABLET | Freq: Every evening | ORAL | Status: DC | PRN
  Administered 2020-09-13 – 2020-09-14 (×2): 5 mg via ORAL
  Filled 2020-09-13 (×2): qty 1

## 2020-09-13 MED ORDER — HYDRALAZINE HCL 20 MG/ML IJ SOLN
INTRAMUSCULAR | Status: AC
  Filled 2020-09-13: qty 1

## 2020-09-13 MED ORDER — LIDOCAINE HCL (PF) 2 % IJ SOLN
INTRAMUSCULAR | Status: AC
  Filled 2020-09-13: qty 5

## 2020-09-13 MED ORDER — SODIUM CHLORIDE 0.9 % IV SOLN: 250.0000 mL | INTRAVENOUS | Status: DC | PRN

## 2020-09-13 MED ORDER — DOCUSATE SODIUM 100 MG PO CAPS
100.0000 mg | ORAL_CAPSULE | Freq: Two times a day (BID) | ORAL | Status: DC
  Administered 2020-09-13 – 2020-09-15 (×4): 100 mg via ORAL
  Filled 2020-09-13 (×4): qty 1

## 2020-09-13 MED ORDER — LACTATED RINGERS IV SOLN: INTRAVENOUS | Status: DC

## 2020-09-13 MED ORDER — PHENYLEPHRINE 40 MCG/ML (10ML) SYRINGE FOR IV PUSH (FOR BLOOD PRESSURE SUPPORT)
PREFILLED_SYRINGE | INTRAVENOUS | Status: DC | PRN
  Administered 2020-09-13: 40 ug via INTRAVENOUS
  Administered 2020-09-13 (×2): 120 ug via INTRAVENOUS
  Administered 2020-09-13: 80 ug via INTRAVENOUS
  Administered 2020-09-13: 120 ug via INTRAVENOUS
  Administered 2020-09-13: 40 ug via INTRAVENOUS
  Administered 2020-09-13: 120 ug via INTRAVENOUS

## 2020-09-13 MED ORDER — FENTANYL CITRATE (PF) 100 MCG/2ML IJ SOLN
INTRAMUSCULAR | Status: AC
  Filled 2020-09-13: qty 2

## 2020-09-13 MED ORDER — PHENYLEPHRINE 40 MCG/ML (10ML) SYRINGE FOR IV PUSH (FOR BLOOD PRESSURE SUPPORT)
PREFILLED_SYRINGE | INTRAVENOUS | Status: AC
  Filled 2020-09-13: qty 10

## 2020-09-13 MED ORDER — SODIUM CHLORIDE 0.9% FLUSH: 3.0000 mL | INTRAVENOUS | Status: DC | PRN

## 2020-09-13 MED ORDER — FERRIC CITRATE 1 GM 210 MG(FE) PO TABS
420.0000 mg | ORAL_TABLET | Freq: Three times a day (TID) | ORAL | Status: DC
  Administered 2020-09-14 – 2020-09-15 (×3): 420 mg via ORAL
  Filled 2020-09-13 (×6): qty 2

## 2020-09-13 MED ORDER — DIPHENHYDRAMINE HCL 12.5 MG/5ML PO ELIX: 12.5000 mg | ORAL_SOLUTION | Freq: Four times a day (QID) | ORAL | Status: DC | PRN

## 2020-09-13 MED ORDER — SODIUM CHLORIDE 0.9% FLUSH
3.0000 mL | Freq: Two times a day (BID) | INTRAVENOUS | Status: DC
  Administered 2020-09-13 – 2020-09-15 (×3): 3 mL via INTRAVENOUS

## 2020-09-13 MED ORDER — OXYCODONE HCL 5 MG/5ML PO SOLN: 5.0000 mg | Freq: Once | ORAL | Status: AC | PRN

## 2020-09-13 MED ORDER — ACETAMINOPHEN 325 MG PO TABS
650.0000 mg | ORAL_TABLET | ORAL | Status: DC | PRN
  Administered 2020-09-15: 650 mg via ORAL
  Filled 2020-09-13: qty 2

## 2020-09-13 MED ORDER — DIPHENHYDRAMINE HCL 50 MG/ML IJ SOLN: 12.5000 mg | Freq: Four times a day (QID) | INTRAMUSCULAR | Status: DC | PRN

## 2020-09-13 MED ORDER — ONDANSETRON HCL 4 MG/2ML IJ SOLN
INTRAMUSCULAR | Status: DC | PRN
  Administered 2020-09-13: 4 mg via INTRAVENOUS

## 2020-09-13 MED ORDER — CHLORHEXIDINE GLUCONATE 0.12 % MT SOLN
15.0000 mL | Freq: Once | OROMUCOSAL | Status: AC
  Administered 2020-09-13: 15 mL via OROMUCOSAL

## 2020-09-13 MED ORDER — ONDANSETRON HCL 4 MG/2ML IJ SOLN
INTRAMUSCULAR | Status: AC
  Filled 2020-09-13: qty 2

## 2020-09-13 MED ORDER — PROPOFOL 10 MG/ML IV BOLUS
INTRAVENOUS | Status: DC | PRN
  Administered 2020-09-13: 100 mg via INTRAVENOUS
  Administered 2020-09-13: 200 mg via INTRAVENOUS

## 2020-09-13 MED ORDER — OXYCODONE HCL 5 MG PO TABS
ORAL_TABLET | ORAL | Status: AC
  Administered 2020-09-13: 5 mg via ORAL
  Filled 2020-09-13: qty 1

## 2020-09-13 MED ORDER — FENTANYL CITRATE (PF) 100 MCG/2ML IJ SOLN: 25.0000 ug | INTRAMUSCULAR | Status: DC | PRN

## 2020-09-13 MED ORDER — SODIUM CHLORIDE 0.9 % IV SOLN: INTRAVENOUS | Status: DC

## 2020-09-13 MED ORDER — SUCCINYLCHOLINE CHLORIDE 20 MG/ML IJ SOLN
INTRAMUSCULAR | Status: DC | PRN
  Administered 2020-09-13: 120 mg via INTRAVENOUS

## 2020-09-13 MED ORDER — FLUTICASONE PROPIONATE 50 MCG/ACT NA SUSP: 1.0000 | Freq: Every day | NASAL | Status: DC | PRN

## 2020-09-13 MED ORDER — PROPOFOL 10 MG/ML IV BOLUS
INTRAVENOUS | Status: AC
  Filled 2020-09-13: qty 20

## 2020-09-13 MED ORDER — LATANOPROST 0.005 % OP SOLN
1.0000 [drp] | Freq: Every day | OPHTHALMIC | Status: DC
  Administered 2020-09-13 – 2020-09-14 (×2): 1 [drp] via OPHTHALMIC
  Filled 2020-09-13: qty 2.5

## 2020-09-13 MED ORDER — TIMOLOL MALEATE 0.5 % OP SOLN
1.0000 [drp] | Freq: Two times a day (BID) | OPHTHALMIC | Status: DC
  Administered 2020-09-13 – 2020-09-15 (×4): 1 [drp] via OPHTHALMIC
  Filled 2020-09-13: qty 5

## 2020-09-13 MED ORDER — LIDOCAINE 2% (20 MG/ML) 5 ML SYRINGE
INTRAMUSCULAR | Status: DC | PRN
  Administered 2020-09-13: 100 mg via INTRAVENOUS

## 2020-09-13 MED ORDER — BELLADONNA ALKALOIDS-OPIUM 16.2-60 MG RE SUPP: 1.0000 | Freq: Four times a day (QID) | RECTAL | Status: DC | PRN

## 2020-09-13 MED ORDER — OXYBUTYNIN CHLORIDE 5 MG PO TABS
5.0000 mg | ORAL_TABLET | Freq: Four times a day (QID) | ORAL | Status: DC | PRN
  Administered 2020-09-13: 5 mg via ORAL
  Filled 2020-09-13: qty 1

## 2020-09-13 MED ORDER — HYDRALAZINE HCL 20 MG/ML IJ SOLN
INTRAMUSCULAR | Status: DC | PRN
  Administered 2020-09-13: 4 mg via INTRAVENOUS

## 2020-09-13 MED ORDER — SODIUM CHLORIDE 0.9 % IR SOLN
3000.0000 mL | Status: DC
  Administered 2020-09-13 – 2020-09-14 (×11): 3000 mL

## 2020-09-13 MED ORDER — ONDANSETRON HCL 4 MG/2ML IJ SOLN: 4.0000 mg | INTRAMUSCULAR | Status: DC | PRN

## 2020-09-13 MED ORDER — ORAL CARE MOUTH RINSE: 15.0000 mL | Freq: Once | OROMUCOSAL | Status: AC

## 2020-09-13 MED ORDER — HYDROCODONE-ACETAMINOPHEN 5-325 MG PO TABS
1.0000 | ORAL_TABLET | ORAL | Status: DC | PRN
  Administered 2020-09-13 – 2020-09-14 (×4): 2 via ORAL
  Filled 2020-09-13 (×4): qty 2

## 2020-09-13 MED ORDER — FENTANYL CITRATE (PF) 100 MCG/2ML IJ SOLN
INTRAMUSCULAR | Status: DC | PRN
  Administered 2020-09-13: 50 ug via INTRAVENOUS
  Administered 2020-09-13: 100 ug via INTRAVENOUS

## 2020-09-13 MED ORDER — SODIUM CHLORIDE 0.9 % IR SOLN
Status: DC | PRN
  Administered 2020-09-13 (×4): 6000 mL via INTRAVESICAL

## 2020-09-13 MED ORDER — SUCCINYLCHOLINE CHLORIDE 200 MG/10ML IV SOSY
PREFILLED_SYRINGE | INTRAVENOUS | Status: AC
  Filled 2020-09-13: qty 10

## 2020-09-13 SURGICAL SUPPLY — 16 items
BAG URINE DRAIN 2000ML AR STRL (UROLOGICAL SUPPLIES) ×3 IMPLANT
BAG URO CATCHER STRL LF (MISCELLANEOUS) ×3 IMPLANT
CATH HEMA 3WAY 30CC 22FR COUDE (CATHETERS) ×3 IMPLANT
GLOVE BIO SURGEON STRL SZ7.5 (GLOVE) ×3 IMPLANT
GOWN STRL REUS W/TWL LRG LVL3 (GOWN DISPOSABLE) ×9 IMPLANT
HOLDER FOLEY CATH W/STRAP (MISCELLANEOUS) ×3 IMPLANT
KIT TURNOVER KIT A (KITS) IMPLANT
LOOP CUT BIPOLAR 24F LRG (ELECTROSURGICAL) ×3 IMPLANT
MANIFOLD NEPTUNE II (INSTRUMENTS) ×3 IMPLANT
PACK CYSTO (CUSTOM PROCEDURE TRAY) ×3 IMPLANT
SYR 30ML LL (SYRINGE) ×3 IMPLANT
SYR TOOMEY IRRIG 70ML (MISCELLANEOUS) ×3
SYRINGE TOOMEY IRRIG 70ML (MISCELLANEOUS) ×1 IMPLANT
TUBING CONNECTING 10 (TUBING) ×2 IMPLANT
TUBING CONNECTING 10' (TUBING) ×1
TUBING UROLOGY SET (TUBING) ×3 IMPLANT

## 2020-09-13 NOTE — Progress Notes (Signed)
Patient ID: Manuel Ortega, male   DOB: 11-29-1950, 68 y.o.   MRN: [id number]  Post-op note  Subjective: The patient is doing well.  No complaints.  Objective: Vital signs in last 24 hours: Temp:  [97.6 F (36.4 C)-98.3 F (36.8 C)] 98 F (36.7 C) (12/02 1400) Pulse Rate:  [90-104] 90 (12/02 1400) Resp:  [15-26] 20 (12/02 1400) BP: (146-179)/(91-99) 179/99 (12/02 1400) SpO2:  [96 %-100 %] 99 % (12/02 1400) Weight:  [102.1 kg] 102.1 kg (12/02 0956)  Intake/Output from previous day: No intake/output data recorded. Intake/Output this shift: Total I/O In: 700 [I.V.:600; IV Piggyback:100] Out: 350 [Urine:300; Blood:50]  Physical Exam:  General: Alert and oriented. Abdomen: Soft, Nondistended. GU: Urine pink on moderate CBI  Lab Results: Recent Labs    09/13/20 1000  HGB 11.6*  HCT 36.1*   Lab Results  Component Value Date   CREATININE 7.06 (H) 09/13/2020   BUN 54 (H) 09/13/2020   NA 140 09/13/2020   K 4.3 09/13/2020   CL 105 09/13/2020   CO2 21 (L) 09/13/2020     Assessment/Plan: POD#0 s/p TUPR  - Continue CBI and titrate off overnight - Dr. Jonnie Finner to see in consultation to determine when he would need dialysis (last HD was Tuesday).  Review of postoperative labs indicate no apparent acute need for dialysis now.  SL IV, fluid restriction, renal diet, labs in AM.   Pryor Curia. MD   LOS: 0 days   Dutch Gray 09/13/2020, 2:34 PM

## 2020-09-13 NOTE — Op Note (Signed)
Preoperative diagnosis: 1. Bladder outlet obstruction secondary to BPH  Postoperative diagnosis:  1. Bladder outlet obstruction secondary to BPH  Procedure:  1. Cystoscopy 2. Transurethral resection of the prostate  Surgeon: Pryor Curia. M.D.  Anesthesia: General  Complications: None  EBL: Minimal  Specimens: 1. Prostate chips  Disposition of specimens: Pathology  Indication: Manuel Ortega is a patient with bladder outlet obstruction secondary to benign prostatic hyperplasia. He developed renal failure due to urinary retention.  After reviewing the management options for treatment, he elected to proceed with the above surgical procedure(s). We have discussed the potential benefits and risks of the procedure, side effects of the proposed treatment, the likelihood of the patient achieving the goals of the procedure, and any potential problems that might occur during the procedure or recuperation. Informed consent has been obtained.  Description of procedure:  The patient was taken to the operating room and general anesthesia was induced.  The patient was placed in the dorsal lithotomy position, prepped and draped in the usual sterile fashion, and preoperative antibiotics were administered. A preoperative time-out was performed.   Cystourethroscopy was performed.  The patient's urethra was examined and was normal.   The bladder was then systematically examined in its entirety. There was no evidence of any bladder tumors, stones, or other mucosal pathology.  The ureteral orifices were identified and marked so as to be avoided during the procedure.  The prostate adenoma was then resected utilizing loop cautery resection with the bipolar cutting loop.  The prostate adenoma from the bladder neck back to the verumontanum was resected beginning at the six o'clock position and then extended to include the right and left lobes of the prostate and anterior prostate. Care was taken  not to resect distal to the verumontanum.  Hemostasis was then achieved with the cautery and the bladder was emptied and reinspected with no significant bleeding noted at the end of the procedure.    A 3 way catheter was then placed into the bladder and placed on continuous bladder irrigation.  The patient appeared to tolerate the procedure well and without complications.  The patient was able to be awakened and transferred to the recovery unit in satisfactory condition.

## 2020-09-13 NOTE — Anesthesia Postprocedure Evaluation (Signed)
Anesthesia Post Note  Patient: Manuel Ortega  Procedure(s) Performed: TRANSURETHRAL RESECTION OF THE PROSTATE (TURP) WITH CYSTOSCOPY (N/A Urethra)     Patient location during evaluation: PACU Anesthesia Type: General Level of consciousness: awake and alert Pain management: pain level controlled Vital Signs Assessment: post-procedure vital signs reviewed and stable Respiratory status: spontaneous breathing, nonlabored ventilation, respiratory function stable and patient connected to nasal cannula oxygen Cardiovascular status: blood pressure returned to baseline and stable Postop Assessment: no apparent nausea or vomiting Anesthetic complications: no   No complications documented.  Last Vitals:  Vitals:   09/13/20 1330 09/13/20 1400  BP: (!) 170/91 (!) 179/99  Pulse: 97 90  Resp: 18 20  Temp:  36.7 C  SpO2: 100% 99%    Last Pain:  Vitals:   09/13/20 1400  TempSrc:   PainSc: 5                  Devaughn Savant S

## 2020-09-13 NOTE — Anesthesia Procedure Notes (Signed)
Procedure Name: Intubation Date/Time: 09/13/2020 11:42 AM Performed by: Maxwell Caul, CRNA Pre-anesthesia Checklist: Patient identified, Emergency Drugs available, Suction available and Patient being monitored Patient Re-evaluated:Patient Re-evaluated prior to induction Oxygen Delivery Method: Circle system utilized Preoxygenation: Pre-oxygenation with 100% oxygen Induction Type: IV induction Ventilation: Mask ventilation without difficulty Laryngoscope Size: Mac and 4 Grade View: Grade I Tube type: Oral Tube size: 7.5 mm Number of attempts: 1 Airway Equipment and Method: Stylet Placement Confirmation: ETT inserted through vocal cords under direct vision,  positive ETCO2 and breath sounds checked- equal and bilateral Secured at: 21 cm Tube secured with: Tape Dental Injury: Teeth and Oropharynx as per pre-operative assessment

## 2020-09-13 NOTE — Discharge Instructions (Signed)

## 2020-09-13 NOTE — Transfer of Care (Signed)
Immediate Anesthesia Transfer of Care Note  Patient: Manuel Ortega  Procedure(s) Performed: TRANSURETHRAL RESECTION OF THE PROSTATE (TURP) WITH CYSTOSCOPY (N/A Urethra)  Patient Location: PACU  Anesthesia Type:General  Level of Consciousness: awake, alert  and oriented  Airway & Oxygen Therapy: Patient Spontanous Breathing  Post-op Assessment: Report given to RN and Post -op Vital signs reviewed and stable  Post vital signs: Reviewed and stable  Last Vitals:  Vitals Value Taken Time  BP 157/96 09/13/20 1253  Temp    Pulse 103 09/13/20 1255  Resp 19 09/13/20 1255  SpO2 99 % 09/13/20 1255  Vitals shown include unvalidated device data.  Last Pain:  Vitals:   09/13/20 0956  TempSrc: Oral  PainSc: 0-No pain         Complications: No complications documented.

## 2020-09-13 NOTE — Anesthesia Preprocedure Evaluation (Addendum)
Anesthesia Evaluation  Patient identified by MRN, date of birth, ID band Patient awake    Reviewed: Allergy & Precautions, NPO status , Patient's Chart, lab work & pertinent test results  Airway Mallampati: II  TM Distance: >3 FB Neck ROM: Full    Dental no notable dental hx.    Pulmonary neg pulmonary ROS,    Pulmonary exam normal breath sounds clear to auscultation       Cardiovascular hypertension, Normal cardiovascular exam Rhythm:Regular Rate:Normal     Neuro/Psych negative neurological ROS  negative psych ROS   GI/Hepatic negative GI ROS, Neg liver ROS,   Endo/Other  negative endocrine ROS  Renal/GU DialysisRenal disease  negative genitourinary   Musculoskeletal negative musculoskeletal ROS (+)   Abdominal   Peds negative pediatric ROS (+)  Hematology negative hematology ROS (+)   Anesthesia Other Findings   Reproductive/Obstetrics negative OB ROS                            Anesthesia Physical Anesthesia Plan  ASA: III  Anesthesia Plan: General   Post-op Pain Management:    Induction: Intravenous  PONV Risk Score and Plan: 2 and Ondansetron, Dexamethasone and Treatment may vary due to age or medical condition  Airway Management Planned: LMA  Additional Equipment:   Intra-op Plan:   Post-operative Plan: Extubation in OR  Informed Consent: I have reviewed the patients History and Physical, chart, labs and discussed the procedure including the risks, benefits and alternatives for the proposed anesthesia with the patient or authorized representative who has indicated his/her understanding and acceptance.     Dental advisory given  Plan Discussed with: CRNA and Surgeon  Anesthesia Plan Comments:        Anesthesia Quick Evaluation

## 2020-09-14 ENCOUNTER — Other Ambulatory Visit: Payer: Self-pay

## 2020-09-14 ENCOUNTER — Encounter (HOSPITAL_COMMUNITY): Payer: Self-pay | Admitting: Urology

## 2020-09-14 DIAGNOSIS — N320 Bladder-neck obstruction: Secondary | ICD-10-CM | POA: Diagnosis present

## 2020-09-14 DIAGNOSIS — N186 End stage renal disease: Secondary | ICD-10-CM | POA: Diagnosis present

## 2020-09-14 DIAGNOSIS — R338 Other retention of urine: Secondary | ICD-10-CM | POA: Diagnosis present

## 2020-09-14 DIAGNOSIS — Z79899 Other long term (current) drug therapy: Secondary | ICD-10-CM | POA: Diagnosis not present

## 2020-09-14 DIAGNOSIS — C61 Malignant neoplasm of prostate: Secondary | ICD-10-CM | POA: Diagnosis present

## 2020-09-14 DIAGNOSIS — Z880 Allergy status to penicillin: Secondary | ICD-10-CM | POA: Diagnosis not present

## 2020-09-14 DIAGNOSIS — Z20822 Contact with and (suspected) exposure to covid-19: Secondary | ICD-10-CM | POA: Diagnosis present

## 2020-09-14 DIAGNOSIS — I120 Hypertensive chronic kidney disease with stage 5 chronic kidney disease or end stage renal disease: Secondary | ICD-10-CM | POA: Diagnosis present

## 2020-09-14 DIAGNOSIS — Z8249 Family history of ischemic heart disease and other diseases of the circulatory system: Secondary | ICD-10-CM | POA: Diagnosis not present

## 2020-09-14 DIAGNOSIS — H409 Unspecified glaucoma: Secondary | ICD-10-CM | POA: Diagnosis present

## 2020-09-14 DIAGNOSIS — N138 Other obstructive and reflux uropathy: Secondary | ICD-10-CM | POA: Diagnosis present

## 2020-09-14 DIAGNOSIS — N401 Enlarged prostate with lower urinary tract symptoms: Secondary | ICD-10-CM | POA: Diagnosis present

## 2020-09-14 DIAGNOSIS — Z823 Family history of stroke: Secondary | ICD-10-CM | POA: Diagnosis not present

## 2020-09-14 DIAGNOSIS — F419 Anxiety disorder, unspecified: Secondary | ICD-10-CM | POA: Diagnosis present

## 2020-09-14 DIAGNOSIS — M1990 Unspecified osteoarthritis, unspecified site: Secondary | ICD-10-CM | POA: Diagnosis present

## 2020-09-14 DIAGNOSIS — Z992 Dependence on renal dialysis: Secondary | ICD-10-CM | POA: Diagnosis not present

## 2020-09-14 DIAGNOSIS — E875 Hyperkalemia: Secondary | ICD-10-CM | POA: Diagnosis not present

## 2020-09-14 DIAGNOSIS — F32A Depression, unspecified: Secondary | ICD-10-CM | POA: Diagnosis present

## 2020-09-14 LAB — POCT I-STAT, CHEM 8
BUN: 28 mg/dL — ABNORMAL HIGH (ref 8–23)
Calcium, Ion: 1.15 mmol/L (ref 1.15–1.40)
Chloride: 100 mmol/L (ref 98–111)
Creatinine, Ser: 4.2 mg/dL — ABNORMAL HIGH (ref 0.61–1.24)
Glucose, Bld: 84 mg/dL (ref 70–99)
HCT: 33 % — ABNORMAL LOW (ref 39.0–52.0)
Hemoglobin: 11.2 g/dL — ABNORMAL LOW (ref 13.0–17.0)
Potassium: 2.9 mmol/L — ABNORMAL LOW (ref 3.5–5.1)
Sodium: 140 mmol/L (ref 135–145)
TCO2: 25 mmol/L (ref 22–32)

## 2020-09-14 LAB — BASIC METABOLIC PANEL
Anion gap: 11 (ref 5–15)
BUN: 56 mg/dL — ABNORMAL HIGH (ref 8–23)
CO2: 20 mmol/L — ABNORMAL LOW (ref 22–32)
Calcium: 8.2 mg/dL — ABNORMAL LOW (ref 8.9–10.3)
Chloride: 105 mmol/L (ref 98–111)
Creatinine, Ser: 7.88 mg/dL — ABNORMAL HIGH (ref 0.61–1.24)
GFR, Estimated: 7 mL/min — ABNORMAL LOW (ref >60)
Glucose, Bld: 104 mg/dL — ABNORMAL HIGH (ref 70–99)
Potassium: 6.6 mmol/L (ref 3.5–5.1)
Sodium: 136 mmol/L (ref 135–145)

## 2020-09-14 LAB — GLUCOSE, CAPILLARY: Glucose-Capillary: 77 mg/dL (ref 70–99)

## 2020-09-14 LAB — SURGICAL PATHOLOGY

## 2020-09-14 MED ORDER — CHLORHEXIDINE GLUCONATE CLOTH 2 % EX PADS
6.0000 | MEDICATED_PAD | Freq: Every day | CUTANEOUS | Status: DC
  Administered 2020-09-14: 6 via TOPICAL

## 2020-09-14 MED ORDER — HEPARIN SODIUM (PORCINE) 1000 UNIT/ML IJ SOLN
INTRAMUSCULAR | Status: AC
  Filled 2020-09-14: qty 4

## 2020-09-14 NOTE — Discharge Summary (Signed)
Date of admission: 09/13/2020  Date of discharge: 09/15/2020  Admission diagnosis: Urinary retention due to BPH  Discharge diagnosis: Urinary retention due to BPH, prostate cancer  Secondary diagnoses: ESRD, depression  History and Physical: For full details, please see admission history and physical. Briefly, Manuel Ortega is a 69 y.o. 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  He has been receiving HD and managed with an indwelling catheter.  Urodynamic evaluation demonstrated preserved detrusor function.  He presents now for TURP for definitive treatment of his bladder outlet obstruction from BPH.   Hospital Course: He underwent TURP on 09/13/20.  He tolerated this well and without complications.  He was maintained on CBI overnight.  Postoperatively, I consulted nephrology for inpatient HD needs.  He did not require HD on 09/13/20.  On POD #1, his potassium had increased to 6.6 and he underwent inpatient HD.  His CBI was weaned. On POD# 2, he was able to be weaned off CBI and discharged home with his catheter.  Outpatient HD was coordinated by nephrology.  He was also incidentally noted to have prostate cancer on his pathology.  This diagnosis was discussed with him and further evaluation will be performed as an outpatient.  Laboratory values:  Recent Labs    09/13/20 1000 09/14/20 1633  HGB 11.6* 11.2*  HCT 36.1* 33.0*   Recent Labs    09/14/20 0439 09/14/20 1633  CREATININE 7.88* 4.20*    Disposition: Home  Discharge instruction: The patient was instructed to be ambulatory but told to refrain from heavy lifting, strenuous activity, or driving.  Discharge medications:  Allergies as of 09/15/2020      Reactions   Penicillins Other (See Comments)   unknown      Medication List    TAKE these medications   Auryxia 1 GM 210 MG(Fe) tablet Generic drug: ferric citrate Take 420 mg by mouth 3 (three) times daily with meals.    diphenhydrAMINE 25 mg capsule Commonly known as: BENADRYL Take 50 mg by mouth every 4 (four) hours as needed for itching or allergies.   docusate sodium 100 MG capsule Commonly known as: COLACE Take 1 capsule (100 mg total) by mouth 2 (two) times daily.   feeding supplement (NEPRO CARB STEADY) Liqd Take 237 mLs by mouth 2 (two) times daily between meals.   FLUoxetine 40 MG capsule Commonly known as: PROZAC Take 1 capsule (40 mg total) by mouth daily.   fluticasone 50 MCG/ACT nasal spray Commonly known as: FLONASE Place 1 spray into both nostrils daily as needed for allergies or rhinitis.   latanoprost 0.005 % ophthalmic solution Commonly known as: XALATAN Place 1 drop into the right eye at bedtime.   magnesium oxide 400 (241.3 Mg) MG tablet Commonly known as: MAG-OX Take 1 tablet (400 mg total) by mouth daily.   melatonin 5 MG Tabs Take 1 tablet (5 mg total) by mouth at bedtime. What changed:   when to take this  reasons to take this   QUEtiapine 200 MG tablet Commonly known as: SEROQUEL Take 200 mg by mouth at bedtime.   tamsulosin 0.4 MG Caps capsule Commonly known as: FLOMAX Take 2 capsules (0.8 mg total) by mouth daily after supper.   timolol 0.5 % ophthalmic solution Commonly known as: TIMOPTIC Place 1 drop into the right eye in the morning and at bedtime.   zolpidem 5 MG tablet Commonly known as: AMBIEN Take 1 tablet (5 mg total) by mouth at bedtime  as needed for sleep.       Followup:   Follow-up Information    Raynelle Bring, MD.   Specialty: Urology Why: 10/03/20 @ 12:45 PM Contact information: [address] [place] [address] [phone]        [facility].   Why: Will call to schedule cathter removal for next week. Contact information: [place] [phone]

## 2020-09-14 NOTE — Consult Note (Signed)
Renal Service Consult Note [facility]  Manuel Ortega 09/14/2020 Sol Blazing, MD Requesting Physician: Dr Alinda Money, Carlean Jews.  Reason for Consult: ESRD pt needs inpatient HD after prostate surgery HPI: The patient is a 69 y.o. year-old w/ hx of recent AKI Aug 2021 requiring hemodialysis [place]  (suspected obstructive uropathy 2/2 BPH) now with foley cath and urology. Dr. Alinda Money following for urologic issues and patient was admitted for TURP which he underwent yesterday w/o issues.  Asked to see for HD.   Pt on HD at [facility] on TTS schedule.  Missed HD yest w/ surgery planned. Doing well w/ HD.  Makes "a lot" of urine. Doesn't tolerate UF "more than 2kg" on HD due to passing out per pt.  No sob, CP, abd pain. Has CBI going now and had some blood clots in the foley overnight but that has resolved.     ROS  denies CP  no joint pain   no HA  no blurry vision  no rash  no diarrhea  no nausea/ vomiting  no dysuria  no difficulty voiding  no change in urine color    Past Medical History  Past Medical History:  Diagnosis Date  . AKI (acute kidney injury) ([place]) 05/2020   possibly due to prostate blockage  . Anxiety   . Arthritis   . Depression   . Dyspnea   . Glaucoma   . Hypertension    Past Surgical History  Past Surgical History:  Procedure Laterality Date  . growth in nostril     biopsied negatiove  . IR FLUORO GUIDE CV LINE RIGHT  05/12/2020  . IR FLUORO GUIDE CV LINE RIGHT  05/28/2020  . IR US GUIDE VASC ACCESS RIGHT  05/12/2020  . IR US GUIDE VASC ACCESS RIGHT  05/28/2020  . TRANSURETHRAL RESECTION OF PROSTATE N/A 09/13/2020   Procedure: TRANSURETHRAL RESECTION OF THE PROSTATE (TURP) WITH CYSTOSCOPY;  Surgeon: Raynelle Bring, MD;  Location: [facility]S;  Service: Urology;  Laterality: N/A;   Family History  Family History  Problem Relation Age of Onset  . Stroke Mother    Social History  reports that he has never smoked. He has never used smokeless  tobacco. He reports previous alcohol use of about 1.0 standard drink of alcohol per week. He reports previous drug use. Allergies  Allergies  Allergen Reactions  . Penicillins Other (See Comments)    unknown   Home medications Prior to Admission medications   Medication Sig Start Date End Date Taking? Authorizing Provider  diphenhydrAMINE (BENADRYL) 25 mg capsule Take 50 mg by mouth every 4 (four) hours as needed for itching or allergies.    Yes [provider]  ferric citrate (AURYXIA) 1 GM 210 MG(Fe) tablet Take 420 mg by mouth 3 (three) times daily with meals.    Yes [provider]  fluticasone (FLONASE) 50 MCG/ACT nasal spray Place 1 spray into both nostrils daily as needed for allergies or rhinitis.   Yes [provider]  latanoprost (XALATAN) 0.005 % ophthalmic solution Place 1 drop into the right eye at bedtime.  12/19/19  Yes [provider]  melatonin 5 MG TABS Take 1 tablet (5 mg total) by mouth at bedtime. Patient taking differently: Take 5 mg by mouth at bedtime as needed (sleep).  06/01/20  Yes Pokhrel, Laxman, MD  QUEtiapine (SEROQUEL) 200 MG tablet Take 200 mg by mouth at bedtime.  11/14/19  Yes [provider]  timolol (TIMOPTIC) 0.5 %  ophthalmic solution Place 1 drop into the right eye in the morning and at bedtime.  07/14/19  Yes [provider]  docusate sodium (COLACE) 100 MG capsule Take 1 capsule (100 mg total) by mouth 2 (two) times daily. Patient not taking: Reported on 09/13/2020 06/01/20   Flora Lipps, MD  FLUoxetine (PROZAC) 40 MG capsule Take 1 capsule (40 mg total) by mouth daily. Patient not taking: Reported on 09/13/2020 06/02/20   Flora Lipps, MD  magnesium oxide (MAG-OX) 400 (241.3 Mg) MG tablet Take 1 tablet (400 mg total) by mouth daily. Patient not taking: Reported on 09/13/2020 06/02/20   Flora Lipps, MD  Nutritional Supplements (FEEDING SUPPLEMENT, NEPRO CARB STEADY,) LIQD Take 237 mLs by mouth 2  (two) times daily between meals. Patient not taking: Reported on 09/03/2020 06/01/20   Flora Lipps, MD  tamsulosin (FLOMAX) 0.4 MG CAPS capsule Take 2 capsules (0.8 mg total) by mouth daily after supper. Patient not taking: Reported on 09/13/2020 06/01/20   Flora Lipps, MD  zolpidem (AMBIEN) 5 MG tablet Take 1 tablet (5 mg total) by mouth at bedtime as needed for sleep. Patient not taking: Reported on 09/13/2020 06/13/20   Mariel Aloe, MD     Vitals:   09/13/20 1635 09/13/20 1700 09/13/20 2101 09/14/20 0506  BP: (!) 158/91 (!) 175/94 (!) 156/84 (!) 148/84  Pulse: 87 85 75 74  Resp: 15 13 20 20   Temp: 98.2 F (36.8 C) 98.2 F (36.8 C) (!) 97.5 F (36.4 C) 97.8 F (36.6 C)  TempSrc:  Oral Oral Oral  SpO2: 99% 100% 100% 100%  Weight:  100.9 kg    Height:  6\' 1"  (1.854 m)     Exam Gen aler No rash, cyanosis or gangrene Sclera anicteric, throat clear  No jvd or bruits Chest clear bilat RRR no MRG Abd soft ntnd no mass or ascites +bs GU normal male w foley cath / CBI MS no joint effusions or deformity  Ext no leg or UE edema, no wounds or ulcers  Neuro is alert, Ox 3 , nf  R IJ TDC       OP HD: [facility]  4h  97.5kg  Hep 5000   RIJ TDC  - hectorol 3  - mircera 75 q2 wks  Assessment/ Plan: 1. BPH sp TURP - on 12/2 yesterday, per urology 2. AKI - HD dependent, hoping for renal recovery. On HD since Aug '21. TTS HD. Plan HD today off schedule at [facility]. OK for dc after HD today. Pt will go to his usual outpt HD tomorrow assuming dc'd later today.  3. Hyperkalemia - K+ 6.6, get EKG.  Rx w/ HD 2nd shift today.  4. HTN/ vol - no vol excess on exam, up 3kg by wts. Get stand wt pre hd. Not on BP lowering meds at home, BP's a little high here.       Kelly Splinter  MD 09/14/2020, 8:55 AM  Recent Labs  Lab 09/13/20 1000  WBC 8.0  HGB 11.6*   Recent Labs  Lab 09/13/20 1316 09/14/20 0439  K 4.3 6.6*  BUN 54* 56*  CREATININE 7.06* 7.88*  CALCIUM 8.0* 8.2*

## 2020-09-14 NOTE — Progress Notes (Signed)
Chaplain engaged in initial visit with Manuel Ortega.  Chaplain spoke with him about Advanced Directive and he noted he would be going to dialysis soon.  Chaplain left paperwork in the room with him and explained to have nurse to input a consult or have chaplain paged when he is ready to go over it.    Chaplain will follow-up.    09/14/20 1400  Clinical Encounter Type  Visited With Patient  Visit Type Initial

## 2020-09-14 NOTE — Progress Notes (Signed)
Pt leaving off unit to go to dialysis at this time. Stable at time of departure.

## 2020-09-14 NOTE — Progress Notes (Signed)
Patient returned to unit at this time from Dialysis .

## 2020-09-14 NOTE — Plan of Care (Signed)

## 2020-09-14 NOTE — Progress Notes (Signed)
Patient ID: Manuel Ortega, male   DOB: [birth year], 69 y.o.   MRN: [id number]  1 Day Post-Op Subjective: Pt doing well.  Did require hand irrigation of clots overnight.  Currently on moderate CBI.  Did not require dialysis yesterday.  Objective: Vital signs in last 24 hours: Temp:  [97.5 F (36.4 C)-98.3 F (36.8 C)] 97.8 F (36.6 C) (12/03 0506) Pulse Rate:  [74-104] 74 (12/03 0506) Resp:  [12-26] 20 (12/03 0506) BP: (146-179)/(84-109) 148/84 (12/03 0506) SpO2:  [96 %-100 %] 100 % (12/03 0506) Weight:  [100.9 kg-102.1 kg] 100.9 kg (12/02 1700)  Intake/Output from previous day: 12/02 0701 - 12/03 0700 In: 26415 [P.O.:420; I.V.:600; IV Piggyback:100] Out: 31225 [Urine:31175; Blood:50] Intake/Output this shift: Total I/O In: -  Out: 2575 [Urine:2575]  Physical Exam:  General: Alert and oriented Abd: Soft, ND GU: Urine light pink on moderate to minimal CBI, I hand irrigated bladder with no clots noted this morning  Lab Results: Recent Labs    09/13/20 1000  HGB 11.6*  HCT 36.1*   BMET Recent Labs    09/13/20 1316 09/14/20 0439  NA 140 136  K 4.3 6.6*  CL 105 105  CO2 21* 20*  GLUCOSE 94 104*  BUN 54* 56*  CREATININE 7.06* 7.88*  CALCIUM 8.0* 8.2*     Studies/Results: No results found.  Assessment/Plan: 1) Urinary retention due to BPH: Continue to wean CBI off.  Plan to d/c home with catheter once no longer requiring CBI, hopefully later today. 2) ESRD: K is 6.6 this morning.  I anticipate he will need HD today.  Will await nephrology input (consulted yesterday).  Will also get their input on recommendations for whether he needs to resume outpatient HD as normally scheduled tomorrow to resume T-Th-S schedule.  Continue fluid restrictions.   LOS: 0 days   Dutch Gray 09/14/2020, 8:11 AM

## 2020-09-14 NOTE — Progress Notes (Signed)
Patient ID: Manuel Ortega, male   DOB: [birth year], 69 y.o.   MRN: [id number]  Pt doing well.  Has not gone to dialysis yet.  Urine pink on medium CBI.  I hand irrigated a decent amount of old appearing clot from the bladder.  CBI restarted.  Now light pink on light CBI.  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  Will plan to keep overnight.  Will keep CBI going at same rate during dialysis to avoid risk of clotting off at [facility] with plans to resume aggressive weaning of CBI once back at [facility].  Plan for discharge home tomorrow with Foley if off CBI at that time.  Labs will be ordered for AM.  Will ask nephrology input regarding plans for when to resume outpatient dialysis (? Possibly Monday) since he is going late this afternoon.  I also discussed his pathology that did indicate prostate cancer.  Will plan to proceed with further evaluation once his urinary retention is resolved.

## 2020-09-15 LAB — BASIC METABOLIC PANEL
Anion gap: 11 (ref 5–15)
BUN: 34 mg/dL — ABNORMAL HIGH (ref 8–23)
CO2: 24 mmol/L (ref 22–32)
Calcium: 8.4 mg/dL — ABNORMAL LOW (ref 8.9–10.3)
Chloride: 104 mmol/L (ref 98–111)
Creatinine, Ser: 5.93 mg/dL — ABNORMAL HIGH (ref 0.61–1.24)
GFR, Estimated: 10 mL/min — ABNORMAL LOW (ref >60)
Glucose, Bld: 83 mg/dL (ref 70–99)
Potassium: 3.6 mmol/L (ref 3.5–5.1)
Sodium: 139 mmol/L (ref 135–145)

## 2020-09-15 MED ORDER — MENTHOL 3 MG MT LOZG
1.0000 | LOZENGE | OROMUCOSAL | Status: DC | PRN
  Administered 2020-09-15: 3 mg via ORAL
  Filled 2020-09-15: qty 9

## 2020-09-15 MED ORDER — LEVOFLOXACIN 500 MG PO TABS: 500.0000 mg | ORAL_TABLET | Freq: Every day | ORAL | 0 refills | Status: AC

## 2020-09-15 NOTE — Progress Notes (Signed)
Pt discharged to home at this time. Prior to dc, pt Iv was removed. Pt drainage bag was changed to leg bag at his request and he was given extra leg and drainage bags. Pt was given dc paperwork regarding medications, care, and follow up apt. Pt verbalized understanding and stated no other concerns at this time. Pt stable at time of discharge and left in his personal vehicle driven by him.

## 2020-09-15 NOTE — Progress Notes (Signed)
[facility] Progress Note  Subjective: doing well, K 3.5 this am.  Wants to go home, we were able to get him 2nd shift spot on Monday.   Vitals:   09/14/20 1825 09/14/20 2023 09/15/20 0516 09/15/20 1217  BP: (!) 167/100 (!) 168/72 133/84 (!) 150/87  Pulse: 88 93 87 87  Resp: 18 18 20 18   Temp:  98.4 F (36.9 C) 98.4 F (36.9 C) 98.9 F (37.2 C)  TempSrc:  Oral Oral Oral  SpO2: 97% 99% 99% 100%  Weight: 96.9 kg     Height:        Exam: Gen aler No rash, cyanosis or gangrene Sclera anicteric, throat clear  No jvd or bruits Chest clear bilat RRR no MRG Abd soft ntnd no mass or ascites +bs GU normal male w foley cath / CBI MS no joint effusions or deformity  Ext no leg or UE edema, no wounds or ulcers  Neuro is alert, Ox 3 , nf  R IJ TDC       OP HD: TTS [facility]  4h  97.5kg  Hep 5000   RIJ TDC  - hectorol 3  - mircera 75 q2 wks  Assessment: 1. BPH sp TURP - on 12/2 yesterday, per urology 2. AKI - HD dependent since Aug 2021, hoping for renal recovery. TTS HD. Had HD yest/ Friday off schedule. Does not need hospital HD today, we have set him up for Monday HD at 11:40 am, pt is aware. OK for dc today from renal standpoint.   3. Hyperkalemia - K+ 6.6 > 3.5 today, resolved 4. HTN/ vol - no vol on exam, 1 kg under dry today      Rob Freja Faro 09/15/2020, 1:03 PM   Recent Labs  Lab 09/13/20 1000 09/13/20 1316 09/14/20 0439 09/14/20 0439 09/14/20 1633 09/15/20 1049  K 5.3*   < > 6.6*   < > 2.9* 3.6  BUN 51*   < > 56*   < > 28* 34*  CREATININE 7.05*   < > 7.88*   < > 4.20* 5.93*  CALCIUM 8.6*   < > 8.2*  --   --  8.4*  HGB 11.6*  --   --   --  11.2*  --    < > = values in this interval not displayed.   Inpatient medications: . Chlorhexidine Gluconate Cloth  6 each Topical Q0600  . docusate sodium  100 mg Oral BID  . ferric citrate  420 mg Oral TID WC  . latanoprost  1 drop Right Eye QHS  . sodium chloride flush  3 mL Intravenous Q12H  .  timolol  1 drop Right Eye BID   . sodium chloride    . sodium chloride irrigation     sodium chloride, acetaminophen, fluticasone, HYDROcodone-acetaminophen, menthol-cetylpyridinium, oxybutynin, prochlorperazine, sodium chloride flush, zolpidem

## 2020-10-31 DIAGNOSIS — N186 End stage renal disease: Secondary | ICD-10-CM | POA: Insufficient documentation

## 2020-11-08 ENCOUNTER — Encounter: Payer: Self-pay | Admitting: Medical Oncology

## 2020-11-08 NOTE — Progress Notes (Signed)
I  introduced myself as the Prostate Nurse Navigator and the Coordinator of the Prostate [place].  1. I confirmed with the patient he is aware of his referral to the clinic 11/23/2020, arriving at  8 am.  2. I discussed the format of the clinic and the physicians he will be seeing that day.  3. I discussed where the clinic is located and how to contact me.  4. He states he not longer has a mailbox so he will not be able to get packet. I will provide forms for him day of clinic.   He voiced understanding of the above. I asked him to call me if he has any questions or concerns regarding his appointments or the forms he needs to complete.

## 2020-11-08 NOTE — Progress Notes (Signed)
Left message requesting a return call to discuss referral to [facility] 2/11.

## 2020-11-16 ENCOUNTER — Encounter: Payer: Self-pay | Admitting: Medical Oncology

## 2020-11-22 ENCOUNTER — Encounter: Payer: Self-pay | Admitting: Medical Oncology

## 2020-11-22 ENCOUNTER — Encounter: Payer: Self-pay | Admitting: Radiation Oncology

## 2020-11-22 NOTE — Progress Notes (Signed)
Left voicemail reminder for [facility] appt 2/11, arriving @ 8 am. I reviewed location, registration and COVID protocol. I reminded him, I will have medical forms for him to  complete upon arrival.He  was unable to receive by mail. I asked him to call back to confirm.

## 2020-11-22 NOTE — Progress Notes (Signed)
GU Location of Tumor / Histology: prostatic adenocarcinoma  If Prostate Cancer, Gleason Score is (3 + 4) and PSA is (1.77). Prostate volume approximately 60 grams.  05/12/2020 PSA 28.60  Manuel Ortega presented with urinary retention related to bladder outlet obstruction causing acute renal failure. He remained dialysis dependent even after catheter drainage. Unfortunately, in October 2021 the patient was unable to void. TURP performed 09/2020 at which time prostatic adenocarcinoma was discovered.  FINAL MICROSCOPIC DIAGNOSIS:   A. 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   Past/Anticipated interventions by urology, if any: TURP, referral to [facility] to determine if prostate biopsy is needed.  Past/Anticipated interventions by medical oncology, if any: no  Weight changes, if any: denies  Bowel/Bladder complaints, if any: Dialysis Tuesday, Thursday, and Saturday   Nausea/Vomiting, if any: denies  Pain issues, if any:  denies  SAFETY ISSUES:  Prior radiation? denies  Pacemaker/ICD? denies  Possible current pregnancy? no, male patient  Is the patient on methotrexate? denies  Current Complaints / other details:  70 year old male. Divorced with one daughter.

## 2020-11-22 NOTE — Progress Notes (Signed)
Pt called to confirm appointment for [facility] 2/11.

## 2020-11-23 ENCOUNTER — Inpatient Hospital Stay: Payer: Medicare Other | Attending: Oncology | Admitting: Oncology

## 2020-11-23 ENCOUNTER — Encounter: Payer: Self-pay | Admitting: Radiation Oncology

## 2020-11-23 ENCOUNTER — Other Ambulatory Visit: Payer: Self-pay | Admitting: Urology

## 2020-11-23 ENCOUNTER — Encounter: Payer: Self-pay | Admitting: General Practice

## 2020-11-23 ENCOUNTER — Encounter: Payer: Self-pay | Admitting: Medical Oncology

## 2020-11-23 ENCOUNTER — Ambulatory Visit
Admission: RE | Admit: 2020-11-23 | Discharge: 2020-11-23 | Disposition: A | Discharge status: Home / Self Care | Payer: Medicare Other | Source: Ambulatory Visit | Attending: Radiation Oncology | Admitting: Radiation Oncology

## 2020-11-23 ENCOUNTER — Other Ambulatory Visit: Payer: Self-pay

## 2020-11-23 VITALS — BP 120/80 | HR 90 | Temp 97.7°F | Resp 20 | Ht 73.0 in | Wt 209.4 lb

## 2020-11-23 DIAGNOSIS — Z992 Dependence on renal dialysis: Secondary | ICD-10-CM | POA: Diagnosis not present

## 2020-11-23 DIAGNOSIS — I10 Essential (primary) hypertension: Secondary | ICD-10-CM | POA: Insufficient documentation

## 2020-11-23 DIAGNOSIS — I120 Hypertensive chronic kidney disease with stage 5 chronic kidney disease or end stage renal disease: Secondary | ICD-10-CM | POA: Insufficient documentation

## 2020-11-23 DIAGNOSIS — N186 End stage renal disease: Secondary | ICD-10-CM | POA: Insufficient documentation

## 2020-11-23 DIAGNOSIS — Z79899 Other long term (current) drug therapy: Secondary | ICD-10-CM | POA: Diagnosis not present

## 2020-11-23 DIAGNOSIS — C61 Malignant neoplasm of prostate: Secondary | ICD-10-CM | POA: Insufficient documentation

## 2020-11-23 DIAGNOSIS — F418 Other specified anxiety disorders: Secondary | ICD-10-CM | POA: Insufficient documentation

## 2020-11-23 DIAGNOSIS — N401 Enlarged prostate with lower urinary tract symptoms: Secondary | ICD-10-CM | POA: Insufficient documentation

## 2020-11-23 DIAGNOSIS — R338 Other retention of urine: Secondary | ICD-10-CM | POA: Insufficient documentation

## 2020-11-23 HISTORY — DX: Malignant neoplasm of prostate: C61

## 2020-11-23 NOTE — Consult Note (Signed)
[facility]     11/23/2020   --------------------------------------------------------------------------------   Manuel Ortega  MRN: [id number]  DOB: 10/04/1951, 70 year old Male  SSN: [id number]   PRIMARY CARE:    REFERRING:  Luvenia Redden  PROVIDER:  Raynelle Bring, M.D.  LOCATION:  [facility] [phone]     --------------------------------------------------------------------------------   CC/HPI: CC: Prostate Cancer   Location of consult: [facility] - Prostate Cancer Multidisciplinary [facility]   Manuel Ortega is a 70 year old anxious gentleman who initially presented to the hospital with renal failure and urinary retention with about two liters of urine in his bladder. Despite catheter drainage, he did not recover his renal function and continues to have dialysis-dependent ESRD managed with hemodialysis T-TH-SAT. His PSA was noted to be elevated at the time of his initial presentation at 28.6 although this was felt to most likely be related to his retention and catheterization. His DRE was normal. He eventually proceeded to TURP after his urodynamic evaluation indicated preserved detrusor function. This was performed in December 2021 and 23 g of tissue was removed and he was able to begin voiding again. Incidentally, he was found to have Gleason 3+4=7 adenocarcinoma in < 5% of the tissue (90% pattern 3 disease). His repeat PSA in January 2022 after TURP and catheter removal was 1.77.   Currently, he continues to void well following his TURP.   Family history: None.   Imaging studies: CT abdomen and pelvis (July 2021): No pelvic lymphadenopathy. Under review today, he was noted to have some small sclerotic lesions that were thought to likely represent benign bone islands. However, in retrospect with his prostate cancer diagnosis, metastatic disease cannot be completely ruled out.   PMH: He has a history of ESRD, anxiety, depression, and  glaucoma.  PSH: No abdominal surgeries.   TNM stage: cT1b N0 Mx  PSA: 1.77  Gleason score: 3+4=7  Biopsy (TURP - Dec 2021): 5% of tissue (23 g resected), 3+4=7 (90% pattern 3)  Prostate volume: NR  PSAD: N/A     ALLERGIES: Penicillin    MEDICATIONS: No Medications    GU PSH: Complex cystometrogram, w/ void pressure and urethral pressure profile studies, any technique - 07/18/2020 Complex Uroflow - 07/18/2020 Cystoscopy - 08/03/2020, 06/27/2020 Cystoscopy TURP - 09/13/2020 Emg surf Electrd - 07/18/2020 Inject For cystogram - 07/18/2020 Insert Bladder Cath; Complex - 06/27/2020 Intrabd voidng Press - 07/18/2020     NON-GU PSH: No Non-GU PSH    GU PMH: Prostate Cancer - 10/03/2020 Urinary Retention - 10/03/2020, - 09/28/2020, - 08/03/2020, - 07/18/2020, - 06/27/2020 BPH w/LUTS - 09/28/2020, - 08/03/2020 Elevated PSA - 06/27/2020 Gross hematuria - 06/27/2020 Acute kidney failure End-Stage Renal Disease      PMH Notes:   1) Urinary retention due to probably bladder outlet obstruction with obstructive uropathy: He presented in August 2021 with acute renal failure likely related to chronic bladder outlet obstruction. He remained dialysis dependent even after catheter drainage.   Oct 2021: Urodynamics - Obstructed, Good detrusor pressure but unable to void  Dec 2021: TURP - 23 g, Gleason 3+4=7 adenocarcinoma in < 5% (90% pattern3, 10% pattern 4)   2) Hematuria: This developed likely related to catheter trauma or distended bladder and decompression. However, he did require CBI during his initial hospitalization.        NON-GU PMH: Anxiety Depression Glaucoma    FAMILY HISTORY: 1 Daughter - Runs in Family Hypertension - Mother   SOCIAL  HISTORY: Marital Status: Divorced Current Smoking Status: Patient has never smoked.   Tobacco Use Assessment Completed: Used Tobacco in last 30 days? Has never drank.  Drinks 1 caffeinated drink per day.    REVIEW OF SYSTEMS:    GU Review  Male:   Patient denies frequent urination, hard to postpone urination, burning/ pain with urination, get up at night to urinate, leakage of urine, stream starts and stops, trouble starting your streams, and have to strain to urinate .  Gastrointestinal (Lower):   Patient denies diarrhea and constipation.  Gastrointestinal (Upper):   Patient denies nausea and vomiting.  Constitutional:   Patient denies fever, night sweats, weight loss, and fatigue.  Skin:   Patient denies skin rash/ lesion and itching.  Eyes:   Patient denies blurred vision and double vision.  Ears/ Nose/ Throat:   Patient denies sore throat and sinus problems.  Hematologic/Lymphatic:   Patient denies swollen glands and easy bruising.  Cardiovascular:   Patient denies chest pains and leg swelling.  Respiratory:   Patient denies cough and shortness of breath.  Endocrine:   Patient denies excessive thirst.  Musculoskeletal:   Patient denies back pain and joint pain.  Neurological:   Patient denies headaches and dizziness.  Psychologic:   Patient denies depression and anxiety.   VITAL SIGNS: None   MULTI-SYSTEM PHYSICAL EXAMINATION:    Constitutional: Well-nourished. No physical deformities. Normally developed. Good grooming.     Complexity of Data:  Lab Test Review:   PSA  Records Review:   Pathology Reports, Previous Patient Records  Notes:                     His PSA was 1.77.   PROCEDURES: None   ASSESSMENT:      ICD-10 Details  1 GU:   Prostate Cancer - C61    PLAN:           Orders X-Rays: MRI Prostate GSORAD With and Without I.V. Contrast          Schedule         Document Letter(s):  Created for Patient: Clinical Summary         Notes:   1. Prostate cancer: We had a detailed discussion today regarding his prostate cancer diagnosis. We discussed the unconventional way which is prostate cancer was diagnosed and the possible need for further evaluation to further stratify his risk. Ultimately, he  understands the option proceeding directly to treatment as well and we discussed the pros and cons of treatment versus continued surveillance with further evaluation necessary if he chooses the latter option. We also discussed the issue of his end-stage renal disease and the possibility that he may require treatment to become a candidate for a possible renal transplant.   The patient was counseled about the natural history of prostate cancer and the standard treatment options that are available for prostate cancer. It was explained to him how his age and life expectancy, clinical stage, Gleason score, and PSA affect his prognosis, the decision to proceed with additional staging studies, as well as how that information influences recommended treatment strategies. We discussed the roles for active surveillance, radiation therapy, surgical therapy, androgen deprivation, as well as ablative therapy options for the treatment of prostate cancer as appropriate to his individual cancer situation. We discussed the risks and benefits of these options with regard to their impact on cancer control and also in terms of potential adverse events, complications, and impact on quality  of life particularly related to urinary and sexual function. The patient was encouraged to ask questions throughout the discussion today and all questions were answered to his stated satisfaction. In addition, the patient was provided with and/or directed to appropriate resources and literature for further education about prostate cancer and treatment options.   After discussion, he is not particularly interested in proceeding with a radical prostatectomy. He may potentially have interest in radiation therapy but is interested in proceeding with further evaluation to further assess his overall risk stratification. As such, he will be scheduled for a prostate MRI. Subsequently, we will then proceed with a transrectal ultrasound-guided biopsy  possibly with MR/ultrasound fusion if indicated for further evaluation and risk stratification. We will then further discuss his options. He appears to be leaning between active surveillance and treatment with external beam radiation.   Cc: Dr. Tyler Pita  Dr. Zola Button

## 2020-11-23 NOTE — Progress Notes (Signed)
                               Care Plan Summary  Name: [name] DOB: 1951/03/18   Your Medical Team:   Urologist -  Dr. Raynelle Bring, [facility]  Radiation Oncologist - Dr. Tyler Pita, [facility]   Medical Oncologist - Dr. Zola Button, [facility]  Recommendations: 1) MRI to further evaluate cancer     * These recommendations are based on information available as of today's consult.      Recommendations may change depending on the results of further tests or exams.    Next Steps: 1) Dr. Alinda Money will schedule MRI    When appointments need to be scheduled, you will be contacted by [facility] and/or [facility].  Questions?  Please do not hesitate to call Cira Rue, RN, BSN, OCN at [phone] any questions or concerns.  Shirlean Mylar is your Oncology Nurse Navigator and is available to assist you while you're receiving your medical care at [facility]

## 2020-11-23 NOTE — Progress Notes (Signed)
[place] Psychosocial Distress Screening Spiritual Care  Met with Manuel Ortega in [facility] to introduce [place] team/resources, reviewing distress screen per protocol.  The patient scored a 7 on the Psychosocial Distress Thermometer which indicates severe distress. Also assessed for distress and other psychosocial needs.   ONCBCN DISTRESS SCREENING 11/23/2020  Screening Type Initial Screening  Distress experienced in past week (1-10) 7  Family Problem type Children  Emotional problem type Depression;Adjusting to illness;Isolation/feeling alone  Information Concerns Type Lack of info about complementary therapy choices  Physical Problem type Tingling hands/feet  Referral to support programs Yes   Manuel Ortega reports that meeting team and learning about treatment plan in [facility] helped reduce his distress to a 4-5. 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 His faith is a key coping tool.  Two healthcare goals in addition to those addressed at [facility] include establishing a relationship with a PCP and having a colonoscopy.  Follow up needed: No. Per Manuel Ortega, no other needs or concerns at this time, but he is aware of ongoing team availability, should needs arise or circumstances change.   [address], [place], [facility] Pager [phone] Voicemail [phone]

## 2020-11-23 NOTE — Progress Notes (Signed)
Radiation Oncology         [phone] ________________________________  Multidisciplinary Prostate Cancer Clinic  Initial Radiation Oncology Consultation  Name: Manuel Ortega MRN: [id number]  Date: 11/23/2020  DOB: 02-Feb-1951  CC:Pcp, No  Manuel Bring, MD   REFERRING PHYSICIAN: Raynelle Bring, MD  DIAGNOSIS: 70 y.o. gentleman with stage T1c adenocarcinoma of the prostate with a Gleason's score of 3+4 and a PSA of 1.77    ICD-10-CM   1. Malignant neoplasm of prostate ([place])  C61   2. Prostate cancer ([place])  C61     HISTORY OF PRESENT ILLNESS::Manuel Ortega is a 70 y.o. gentleman.  He initially presented to the ED on 05/11/20 with urinary retention, as well as acute renal failure.  A CT A/P revealed marked urinary bladder distention with bilateral hydroureteronephrosis and an enlarged prostate gland causing mass effect on the bladder base.  There was also noted a nonspecific 5 mm pulmonary lymph node in the right lower lobe, felt likely to represent a perifissural intrapulmonary lymph node. PSA was performed at that time and was noted to be significantly elevated at 28.6, but most likely related to his urinary retention and recent catheterization. He was seen as an inpatient by Dr. Alinda Ortega on 05/16/20, and digital rectal examination was benign.  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  He followed up with Dr. Alinda Ortega as an outpatient and underwent surveillance cystoscopy on 08/03/20 showing severe prostate hyperplasia. He proceeded with a TURP procedure on 09/13/20 to help alleviate the bladder outlet obstruction, and final surgical pathology revealed a small amount of Gleason 3+4 prostate cancer, involving approximately 5% of the tissue. His most recent PSA from 11/09/2020 had returned to normal at 1.77.  The patient reviewed the biopsy results with his urologist and he has kindly been referred today to the  multidisciplinary prostate cancer clinic for presentation of pathology and radiology studies in our conference for discussion of potential radiation treatment options and clinical evaluation.  PREVIOUS RADIATION THERAPY: No  PAST MEDICAL HISTORY:  has a past medical history of AKI (acute kidney injury) ([place]) (05/2020), Anxiety, Arthritis, Depression, Dyspnea, Glaucoma, Hypertension, and Prostate cancer ([place]).    PAST SURGICAL HISTORY: Past Surgical History:  Procedure Laterality Date  . growth in nostril     biopsied negatiove  . IR FLUORO GUIDE CV LINE RIGHT  05/12/2020  . IR FLUORO GUIDE CV LINE RIGHT  05/28/2020  . IR US GUIDE VASC ACCESS RIGHT  05/12/2020  . IR US GUIDE VASC ACCESS RIGHT  05/28/2020  . TRANSURETHRAL RESECTION OF PROSTATE N/A 09/13/2020   Procedure: TRANSURETHRAL RESECTION OF THE PROSTATE (TURP) WITH CYSTOSCOPY;  Surgeon: Manuel Bring, MD;  Location: [facility];  Service: Urology;  Laterality: N/A;    FAMILY HISTORY: family history includes Stroke in his mother.  SOCIAL HISTORY:  reports that he has never smoked. He has never used smokeless tobacco. He reports current alcohol use of about 1.0 standard drink of alcohol per week. He reports that he does not use drugs.  ALLERGIES: Penicillins  MEDICATIONS:  Current Outpatient Medications  Medication Sig Dispense Refill  . diphenhydrAMINE (BENADRYL) 25 mg capsule Take 50 mg by mouth every 4 (four) hours as needed for itching or allergies.     Marland Kitchen divalproex (DEPAKOTE SPRINKLE) 125 MG capsule Take by mouth.    . docusate calcium (SURFAK) 240 MG capsule Take by mouth.    . doxercalciferol (HECTOROL) 0.5 MCG capsule Doxercalciferol (Hectorol)    .  ferric citrate (AURYXIA) 1 GM 210 MG(Fe) tablet Take 420 mg by mouth 3 (three) times daily with meals.     . fluticasone (FLONASE) 50 MCG/ACT nasal spray Place 1 spray into both nostrils daily as needed for allergies or rhinitis.    . heparin 1000 unit/mL SOLN injection Heparin Sodium  (Porcine) 1,000 Units/mL Catheter Lock Arterial    . latanoprost (XALATAN) 0.005 % ophthalmic solution Place 1 drop into the right eye at bedtime.     . Magnesium Oxide 400 MG CAPS Take by mouth.    . melatonin 5 MG TABS Take 1 tablet (5 mg total) by mouth at bedtime. (Patient taking differently: Take 5 mg by mouth at bedtime as needed (sleep).) 30 tablet 0  . Psyllium 0.36 g CAPS Take by mouth.    . QUEtiapine (SEROQUEL) 200 MG tablet Take 200 mg by mouth at bedtime.     . timolol (TIMOPTIC) 0.5 % ophthalmic solution Place 1 drop into the right eye in the morning and at bedtime.     . docusate sodium (COLACE) 100 MG capsule Take 1 capsule (100 mg total) by mouth 2 (two) times daily. (Patient not taking: No sig reported)    . FLUoxetine (PROZAC) 40 MG capsule Take 1 capsule (40 mg total) by mouth daily. (Patient not taking: No sig reported)    . magnesium oxide (MAG-OX) 400 (241.3 Mg) MG tablet Take 1 tablet (400 mg total) by mouth daily. (Patient not taking: No sig reported) 30 tablet   . Nutritional Supplements (FEEDING SUPPLEMENT, NEPRO CARB STEADY,) LIQD Take 237 mLs by mouth 2 (two) times daily between meals. (Patient not taking: No sig reported)  0  . tamsulosin (FLOMAX) 0.4 MG CAPS capsule Take 2 capsules (0.8 mg total) by mouth daily after supper. (Patient not taking: No sig reported)    . zolpidem (AMBIEN) 5 MG tablet Take 1 tablet (5 mg total) by mouth at bedtime as needed for sleep. (Patient not taking: No sig reported) 5 tablet 0   No current facility-administered medications for this encounter.    REVIEW OF SYSTEMS:  On review of systems, the patient reports that he is doing well overall. He denies any chest pain, shortness of breath, cough, fevers, chills, night sweats, unintended weight changes. He denies any bowel disturbances, and denies abdominal pain, nausea or vomiting. He denies any new musculoskeletal or joint aches or pains. He reports improvement in his urinary symptoms since  the time of his TURP and is now able to void on his own without difficulty.  Unfortunately, his renal function has not significantly improved and he remains on dialysis three days a week. A complete review of systems is obtained and is otherwise negative.   PHYSICAL EXAM:  Wt Readings from Last 3 Encounters:  11/23/20 209 lb 6.4 oz (95 kg)  09/14/20 213 lb 10 oz (96.9 kg)  09/03/20 218 lb (98.9 kg)   Temp Readings from Last 3 Encounters:  11/23/20 97.7 F (36.5 C)  09/15/20 98.9 F (37.2 C) (Oral)  09/03/20 98.8 F (37.1 C) (Oral)   BP Readings from Last 3 Encounters:  11/23/20 120/80  09/15/20 (!) 150/87  09/03/20 (!) 158/84   Pulse Readings from Last 3 Encounters:  11/23/20 90  09/15/20 87  06/13/20 83   Pain Assessment Pain Score: 0-No pain/10  In general this is a well appearing African-American male in no acute distress. He's alert and oriented x4 and appropriate throughout the examination. Cardiopulmonary assessment is negative for  acute distress and he exhibits normal effort.    KPS = 90  100 - Normal; no complaints; no evidence of disease. 90   - Able to carry on normal activity; minor signs or symptoms of disease. 80   - Normal activity with effort; some signs or symptoms of disease. 52   - Cares for self; unable to carry on normal activity or to do active work. 60   - Requires occasional assistance, but is able to care for most of his personal needs. 50   - Requires considerable assistance and frequent medical care. 91   - Disabled; requires special care and assistance. 58   - Severely disabled; hospital admission is indicated although death not imminent. 36   - Very sick; hospital admission necessary; active supportive treatment necessary. 10   - Moribund; fatal processes progressing rapidly. 0     - Dead  Karnofsky DA, [facility], Craver LS and [facility] [phone]) The use of the nitrogen mustards in the palliative treatment of carcinoma: with particular  reference to bronchogenic carcinoma Cancer 1 [id number]   LABORATORY DATA:  Lab Results  Component Value Date   WBC 8.0 09/13/2020   HGB 11.2 (L) 09/14/2020   HCT 33.0 (L) 09/14/2020   MCV 89.8 09/13/2020   PLT 263 09/13/2020   Lab Results  Component Value Date   NA 139 09/15/2020   K 3.6 09/15/2020   CL 104 09/15/2020   CO2 24 09/15/2020   Lab Results  Component Value Date   ALT 12 06/11/2020   AST 15 06/11/2020   ALKPHOS 70 06/11/2020   BILITOT 0.5 06/11/2020     RADIOGRAPHY: No results found.    IMPRESSION/PLAN: 70 y.o. gentleman with Stage T1c adenocarcinoma of the prostate with a Gleason score of 3+4 and a PSA of 1.77.    We discussed the patient's workup and outlined the nature of prostate cancer in this setting. The patient's T stage, Gleason's score, and PSA put him into the favorable intermediate risk group. Accordingly, he is eligible for a variety of potential treatment options including active surveillance, brachytherapy, 5.5 weeks of external radiation, or prostatectomy. We discussed the available radiation techniques, and focused on the details and logistics of delivery. The patient is not an ideal candidate for brachytherapy given his recent TURP procedure.  Therefore, we discussed and outlined the risks, benefits, short and long-term effects associated with external beam radiotherapy and compared and contrasted these with prostatectomy. We discussed the role of SpaceOAR gel in reducing the rectal toxicity associated with radiotherapy. He appears to have a good understanding of his disease and our treatment recommendations which are of curative intent.  He was encouraged to ask questions that were answered to his stated satisfaction.  At the end of the conversation, the patient is interested in moving forward with active surveillance for now. He will be scheduled for a prostate MRI and fusion/surveillance prostate biopsy and pending those results, will make his decision  regarding continuing in active surveillance versus proceeding with treatment. He does appear to be leaning towards external beam radiation therapy as his form of curative treatment if/when treatment is deemed necessary. We enjoyed meeting him today and look forward to following along in his care.     Nicholos Johns, PA-C    Tyler Pita, MD  [facility] Direct Dial: [phone]  Fax: [phone] .com  Skype  LinkedIn   This document serves as a record of services personally performed by  Tyler Pita, MD and Freeman Caldron, PA-C. It was created on their behalf by Wilburn Mylar, a trained medical scribe. The creation of this record is based on the scribe's personal observations and the provider's statements to them. This document has been checked and approved by the attending provider.

## 2020-11-23 NOTE — Progress Notes (Signed)
Reason for the request:    Prostate cancer  HPI: I was asked by Dr. Alinda Money to evaluate [name] with evaluation of prostate cancer.  He is a pleasant 70 year old gentleman who presented with acute renal failure related to bladder outlet obstruction diagnosed in August 2021.  He has sustained permanent kidney damage and currently on dialysis.  He underwent a TURP procedure under the care of Dr. Alinda Money in December 2021 and the pathology showed Gleason score 3+4 equal 7 adenocarcinoma with less than 5% involvement.  His PSA at that time was 1.77.  His voiding symptoms improved after this procedure but continues to require dialysis at this time.  Clinically he reports no other complaints other than lower abdominal discomfort at times.  He denies any hematochezia, melena or hemoptysis.    Past Medical History:  Diagnosis Date  . AKI (acute kidney injury) ([place]) 05/2020   possibly due to prostate blockage  . Anxiety   . Arthritis   . Depression   . Dyspnea   . Glaucoma   . Hypertension   . Prostate cancer [facility])   :  Past Surgical History:  Procedure Laterality Date  . growth in nostril     biopsied negatiove  . IR FLUORO GUIDE CV LINE RIGHT  05/12/2020  . IR FLUORO GUIDE CV LINE RIGHT  05/28/2020  . IR US GUIDE VASC ACCESS RIGHT  05/12/2020  . IR US GUIDE VASC ACCESS RIGHT  05/28/2020  . TRANSURETHRAL RESECTION OF PROSTATE N/A 09/13/2020   Procedure: TRANSURETHRAL RESECTION OF THE PROSTATE (TURP) WITH CYSTOSCOPY;  Surgeon: Raynelle Bring, MD;  Location: [facility];  Service: Urology;  Laterality: N/A;  :   Current Outpatient Medications:  .  diphenhydrAMINE (BENADRYL) 25 mg capsule, Take 50 mg by mouth every 4 (four) hours as needed for itching or allergies. , Disp: , Rfl:  .  divalproex (DEPAKOTE SPRINKLE) 125 MG capsule, Take by mouth., Disp: , Rfl:  .  docusate calcium (SURFAK) 240 MG capsule, Take by mouth., Disp: , Rfl:  .  docusate sodium (COLACE) 100 MG capsule, Take 1 capsule (100 mg  total) by mouth 2 (two) times daily. (Patient not taking: No sig reported), Disp: , Rfl:  .  doxercalciferol (HECTOROL) 0.5 MCG capsule, Doxercalciferol (Hectorol), Disp: , Rfl:  .  ferric citrate (AURYXIA) 1 GM 210 MG(Fe) tablet, Take 420 mg by mouth 3 (three) times daily with meals. , Disp: , Rfl:  .  FLUoxetine (PROZAC) 40 MG capsule, Take 1 capsule (40 mg total) by mouth daily. (Patient not taking: No sig reported), Disp: , Rfl:  .  fluticasone (FLONASE) 50 MCG/ACT nasal spray, Place 1 spray into both nostrils daily as needed for allergies or rhinitis., Disp: , Rfl:  .  heparin 1000 unit/mL SOLN injection, Heparin Sodium (Porcine) 1,000 Units/mL Catheter Lock Arterial, Disp: , Rfl:  .  latanoprost (XALATAN) 0.005 % ophthalmic solution, Place 1 drop into the right eye at bedtime. , Disp: , Rfl:  .  magnesium oxide (MAG-OX) 400 (241.3 Mg) MG tablet, Take 1 tablet (400 mg total) by mouth daily. (Patient not taking: No sig reported), Disp: 30 tablet, Rfl:  .  Magnesium Oxide 400 MG CAPS, Take by mouth., Disp: , Rfl:  .  melatonin 5 MG TABS, Take 1 tablet (5 mg total) by mouth at bedtime. (Patient taking differently: Take 5 mg by mouth at bedtime as needed (sleep).), Disp: 30 tablet, Rfl: 0 .  Nutritional Supplements (FEEDING SUPPLEMENT, NEPRO CARB STEADY,) LIQD, Take  237 mLs by mouth 2 (two) times daily between meals. (Patient not taking: No sig reported), Disp: , Rfl: 0 .  Psyllium 0.36 g CAPS, Take by mouth., Disp: , Rfl:  .  QUEtiapine (SEROQUEL) 200 MG tablet, Take 200 mg by mouth at bedtime. , Disp: , Rfl:  .  tamsulosin (FLOMAX) 0.4 MG CAPS capsule, Take 2 capsules (0.8 mg total) by mouth daily after supper. (Patient not taking: No sig reported), Disp: , Rfl:  .  timolol (TIMOPTIC) 0.5 % ophthalmic solution, Place 1 drop into the right eye in the morning and at bedtime. , Disp: , Rfl:  .  zolpidem (AMBIEN) 5 MG tablet, Take 1 tablet (5 mg total) by mouth at bedtime as needed for sleep. (Patient  not taking: No sig reported), Disp: 5 tablet, Rfl: 0:  Allergies  Allergen Reactions  . Penicillins Other (See Comments)    unknown  :  Family History  Problem Relation Age of Onset  . Stroke Mother   . Breast cancer Neg Hx   . Colon cancer Neg Hx   . Prostate cancer Neg Hx   . Pancreatic cancer Neg Hx   :  Social History   Socioeconomic History  . Marital status: Divorced    Spouse name: Not on file  . Number of children: Not on file  . Years of education: Not on file  . Highest education level: Not on file  Occupational History  . Occupation: Chief Financial Officer  Tobacco Use  . Smoking status: Never Smoker  . Smokeless tobacco: Never Used  Vaping Use  . Vaping Use: Never used  Substance and Sexual Activity  . Alcohol use: Yes    Alcohol/week: 1.0 standard drink    Types: 1 Glasses of wine per week    Comment: rare  . Drug use: Never  . Sexual activity: Not Currently    Birth control/protection: None  Other Topics Concern  . Not on file  Social History Narrative  . Not on file   Social Determinants of Health   Financial Resource Strain: Not on file  Food Insecurity: Not on file  Transportation Needs: Not on file  Physical Activity: Not on file  Stress: Not on file  Social Connections: Not on file  Intimate Partner Violence: Not on file  :  Pertinent items are noted in HPI.  Exam:  General appearance: alert and cooperative appeared without distress. Head: atraumatic without any abnormalities. Eyes: conjunctivae/corneas clear. PERRL.  Sclera anicteric. Throat: lips, mucosa, and tongue normal; without oral thrush or ulcers. Resp: clear to auscultation bilaterally without rhonchi, wheezes or dullness to percussion. Cardio: regular rate and rhythm, S1, S2 normal, no murmur, click, rub or gallop GI: soft, non-tender; bowel sounds normal; no masses,  no organomegaly Skin: Skin color, texture, turgor normal. No rashes or lesions Lymph nodes: Cervical,  supraclavicular, and axillary nodes normal. Neurologic: Grossly normal without any motor, sensory or deep tendon reflexes. Musculoskeletal: No joint deformity or effusion.    Assessment and Plan:    70 year old man with prostate cancer was diagnosed in December 2021.  He was found to have Gleason score 3+4 = 7 and less than 5% of the submitted tissue on a TURP procedure.  PSA was 1.77.   His case was discussed today the prostate cancer multidisciplinary clinic including review of his pathology results and laboratory data with the reviewing pathologist.  Treatment options were reviewed which include continued active surveillance at this time and repeat MRI for better staging purposes.  Repeat biopsy indicated in the future to better stage him and better elucidate his treatment options moving forward.  Based on the limited prostate cancer and low PSA it is reasonable to continue with active surveillance at this time.  He is agreeable with this plan.  All his questions were answered to his satisfaction.  There is no role for any systemic therapy at this time based on his limited disease.  The risk of her developing metastatic disease at this time remains low unless his repeat biopsy showed otherwise more aggressive cancer.  30  minutes were dedicated to this visit. The time was spent on reviewing laboratory data, imaging studies, discussing treatment options, and answering questions regarding future plan.    A copy of this consult has been forwarded to the requesting physician.

## 2020-11-26 ENCOUNTER — Encounter: Payer: Self-pay | Admitting: Medical Oncology

## 2020-11-26 NOTE — Progress Notes (Signed)
Pt was seen in [facility] and voiced interest in getting established with a primary care. He could not remember the names of the practices and called back. I provided numbers and asked him tocall me if he has trouble. He voiced understanding. He is also schedueld for MRI of the prostate 3/6.

## 2020-11-29 DIAGNOSIS — L299 Pruritus, unspecified: Secondary | ICD-10-CM | POA: Insufficient documentation

## 2020-12-16 ENCOUNTER — Other Ambulatory Visit: Payer: Self-pay

## 2020-12-16 ENCOUNTER — Ambulatory Visit
Admission: RE | Admit: 2020-12-16 | Discharge: 2020-12-16 | Disposition: A | Discharge status: Home / Self Care | Payer: Medicare Other | Source: Ambulatory Visit | Attending: Urology | Admitting: Urology

## 2020-12-16 DIAGNOSIS — C61 Malignant neoplasm of prostate: Secondary | ICD-10-CM

## 2020-12-20 ENCOUNTER — Encounter: Payer: Self-pay | Admitting: Medical Oncology

## 2020-12-20 NOTE — Progress Notes (Signed)
Returned call and left message regarding primary care physician. He asked for a name in the [place]. I gave him the name of [facility].

## 2021-03-12 ENCOUNTER — Ambulatory Visit: Payer: Medicare Other | Admitting: Family Medicine

## 2021-03-12 ENCOUNTER — Telehealth: Payer: Self-pay

## 2021-03-12 NOTE — Telephone Encounter (Signed)
Left patient a vm to give me a call back in regard to getting scheduled at another office location.

## 2021-03-13 NOTE — Telephone Encounter (Signed)
Left pt vm to call back

## 2021-03-13 NOTE — Telephone Encounter (Signed)
Pt returned your call. Please advise.

## 2021-03-14 NOTE — Telephone Encounter (Signed)
I have gotten patient scheduled with Dr. Carlota Raspberry at [facility].

## 2021-03-20 ENCOUNTER — Ambulatory Visit: Payer: Medicare Other | Admitting: Family Medicine

## 2021-03-30 DIAGNOSIS — Z992 Dependence on renal dialysis: Secondary | ICD-10-CM | POA: Insufficient documentation

## 2021-04-16 DIAGNOSIS — F331 Major depressive disorder, recurrent, moderate: Secondary | ICD-10-CM | POA: Insufficient documentation

## 2021-04-16 DIAGNOSIS — H409 Unspecified glaucoma: Secondary | ICD-10-CM | POA: Insufficient documentation

## 2021-07-29 DIAGNOSIS — M25562 Pain in left knee: Secondary | ICD-10-CM | POA: Insufficient documentation

## 2021-08-08 IMAGING — MR MR PROSTATE W/O CM
8 series · 48 of 48 positions shown · non-contrast
Comparison: CT abdomen/pelvis dated 05/11/2020.

CLINICAL DATA: Elevated PSA (1.77). Prostate cancer, Gleason score
7, involving 5% of submitted tissue on TURP dated 09/13/2020.

EXAM:
MR PROSTATE WITHOUT CONTRAST
TECHNIQUE: Multiplanar multisequence MRI images were obtained of the pelvis
centered about the prostate, without contrast administration.

[Series 3: T2 · coronal · 3.0mm · 0.56mm/px · 4 of 30 slices shown (1 of 3)]
[im 1/30]
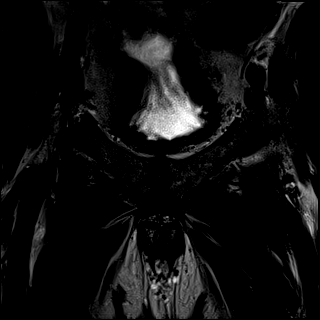
[im 10/30]
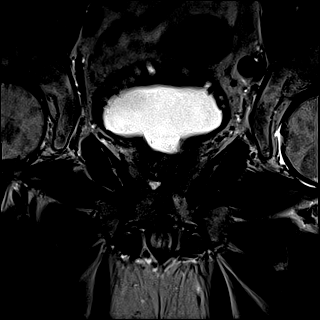
[im 20/30]
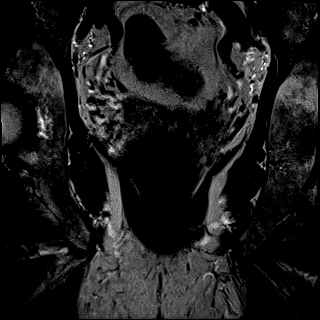
[im 30/30]
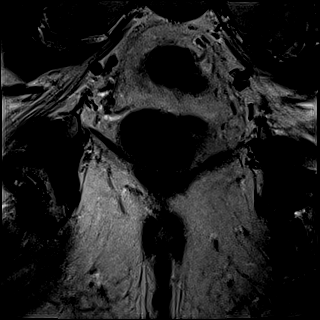

[Series 4: T1 · axial · 5.0mm · 1.25mm/px · z∈[-94,+121]mm · 11 of 88 slices shown]
[im 1/88]
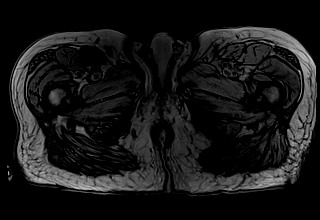
[im 9/88]
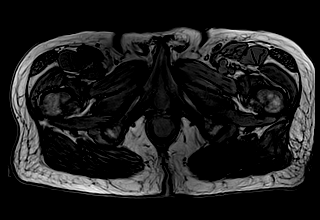
[im 18/88]
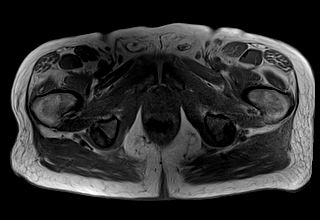
[im 27/88]
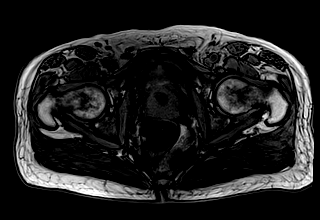
[im 35/88]
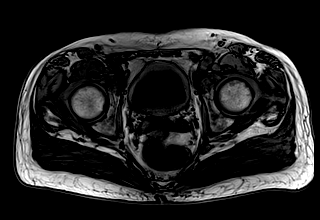
[im 44/88]
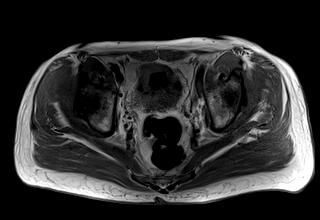
[im 53/88]
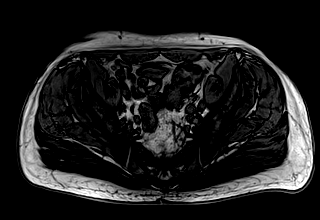
[im 61/88]
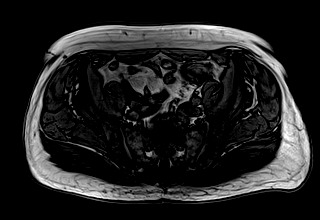
[im 70/88]
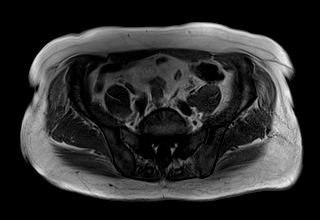
[im 79/88]
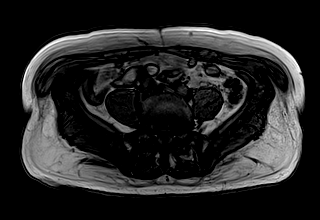
[im 88/88]
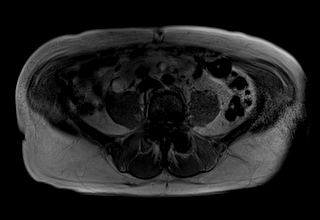

[Series 5: DWI · axial · 3.0mm · 1.75mm/px · z∈[-101,-14]mm · 10 of 90 slices shown (1 of 3)]
[im 1/90]
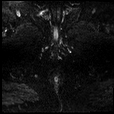
[im 10/90]
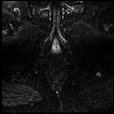
[im 20/90]
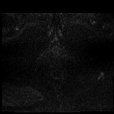
[im 30/90]
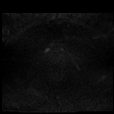
[im 40/90]
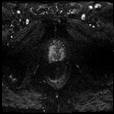
[im 50/90]
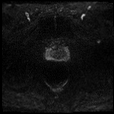
[im 60/90]
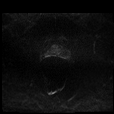
[im 70/90]
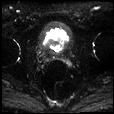
[im 80/90]
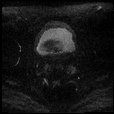
[im 90/90]
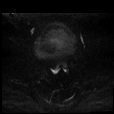

[Series 6: DWI · axial · 3.0mm · 1.75mm/px · z∈[-101,-14]mm · 3 of 30 slices shown (2 of 3)]
[im 1/30]
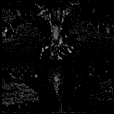
[im 15/30]
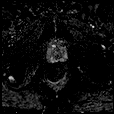
[im 30/30]
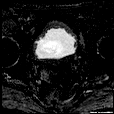

[Series 7: DWI · axial · 3.0mm · 1.75mm/px · z∈[-101,-14]mm · 3 of 30 slices shown (3 of 3)]
[im 1/30]
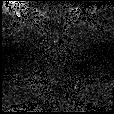
[im 15/30]
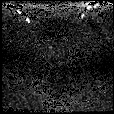
[im 30/30]
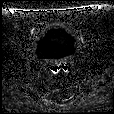

[Series 8: T2 · axial · 3.0mm · 0.56mm/px · z∈[-84,-12]mm · 3 of 25 slices shown (2 of 3)]
[im 1/25]
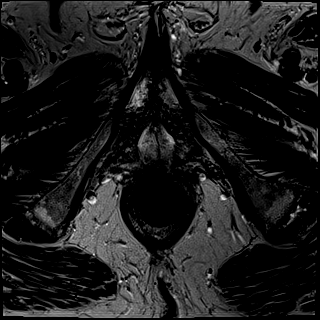
[im 13/25]
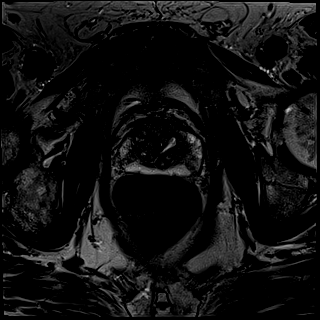
[im 25/25]
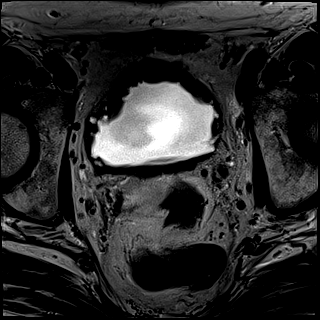

[Series 9: T2 · axial · 1.0mm · 1.04mm/px · z∈[-93,-6]mm · 10 of 88 slices shown (3 of 3)]
[im 1/88]
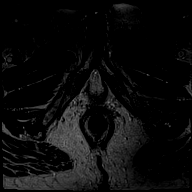
[im 10/88]
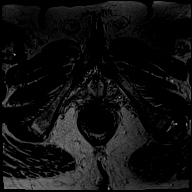
[im 20/88]
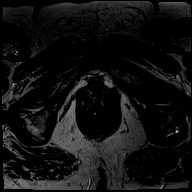
[im 30/88]
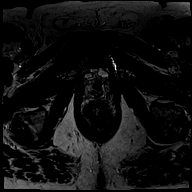
[im 39/88]
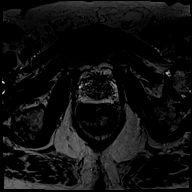
[im 49/88]
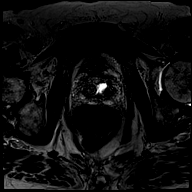
[im 59/88]
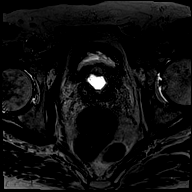
[im 68/88]
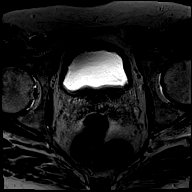
[im 78/88]
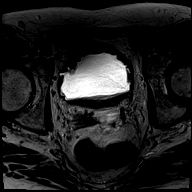
[im 88/88]
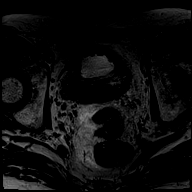

[Series 10: pre t1_twist_tra_dyn · axial · non-contrast · 3.5mm · 0.83mm/px · z∈[-104,+4]mm · 4 of 32 slices shown]
[im 1/32]
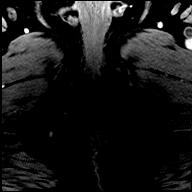
[im 11/32]
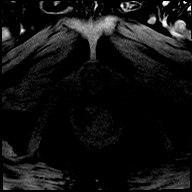
[im 21/32]
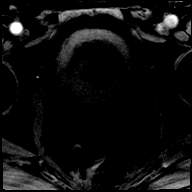
[im 32/32]
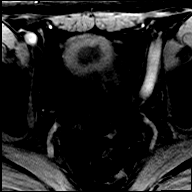

[48 of 48 positions shown; findings below may reference images not displayed]

FINDINGS: Limited evaluation due to lack of intravenous contrast
administration.

Prostate: Postsurgical changes related to TURP.

2.2 x 1.4 x 1.0 cm central gland nodules along the right aspect of
the TURP bed, fairly well circumscribed. Associated mild low
ADC/restricted diffusion (series 6/image 12). PI-RADS 3. Given the
overall clinical picture, these are likely benign BPH nodules.

2.1 x 1.0 x 1.9 cm nodule at the extreme right apex of the
peripheral zone (series 8/image 17). Associated mild restricted
diffusion/low ADC (series 5/image 76). PI-RADS 5. Associated
extracapsular extension at the extreme anterior apex (series 8/image
18).

Additional small subcentimeter focal low T2 lesions in the mid gland
to base bilaterally (series 8/images 12, 14, and 15).

Volume: 5.1 x 3.4 x 4.0 cm (calculated volume 36.3 mL)

Transcapsular spread:  Suspected at the extreme right anterior apex.

Seminal vesicle involvement: Absent.

Neurovascular bundle involvement: Absent.

Pelvic adenopathy: Absent.

Bone metastasis: 9 mm sclerotic lesion along the right
parasymphyseal region (series 8/image 13), poorly evaluated.

Other findings: Mild fat in the bilateral inguinal canals.
IMPRESSION: 2.1 cm lesion at the extreme right apex of the peripheral zone,
highly suspicious for high-grade macroscopic prostate cancer
(PI-RADS 5). Associated extracapsular extension at the extreme
anterior apex.

Postsurgical changes related to TURP. Residual nodules along the
right TURP bed favor BPH nodules but are technically indeterminate
(PI-RADS 3).

9 mm sclerotic lesion along the right parasymphyseal region, poorly
evaluated on unenhanced MRI. While this may reflect a benign bone
island, metastasis certainly not excluded. Consider whole-body bone
scan for further evaluation.

## 2022-01-30 ENCOUNTER — Ambulatory Visit: Payer: Medicare Other | Admitting: Gastroenterology

## 2022-02-19 DIAGNOSIS — R197 Diarrhea, unspecified: Secondary | ICD-10-CM | POA: Insufficient documentation

## 2022-11-20 ENCOUNTER — Ambulatory Visit: Payer: Medicare Other | Admitting: Podiatry

## 2022-11-20 ENCOUNTER — Encounter: Payer: Self-pay | Admitting: Podiatry

## 2022-11-20 DIAGNOSIS — B351 Tinea unguium: Secondary | ICD-10-CM | POA: Diagnosis not present

## 2022-11-20 DIAGNOSIS — N186 End stage renal disease: Secondary | ICD-10-CM | POA: Diagnosis not present

## 2022-11-20 DIAGNOSIS — D689 Coagulation defect, unspecified: Secondary | ICD-10-CM | POA: Diagnosis not present

## 2022-11-20 DIAGNOSIS — M79674 Pain in right toe(s): Secondary | ICD-10-CM | POA: Diagnosis not present

## 2022-11-20 DIAGNOSIS — M79675 Pain in left toe(s): Secondary | ICD-10-CM

## 2022-11-20 DIAGNOSIS — L84 Corns and callosities: Secondary | ICD-10-CM | POA: Diagnosis not present

## 2022-11-20 MED ORDER — CLOTRIMAZOLE-BETAMETHASONE 1-0.05 % EX CREA: 1.0000 | TOPICAL_CREAM | Freq: Every day | CUTANEOUS | 0 refills | Status: DC

## 2022-11-20 MED ORDER — FLUCONAZOLE 150 MG PO TABS: 150.0000 mg | ORAL_TABLET | ORAL | 0 refills | Status: DC

## 2022-11-20 NOTE — Progress Notes (Signed)
  Subjective:  Patient ID: Manuel Ortega, male    DOB: March 15, 1951,  MRN: [id number]  Chief Complaint  Patient presents with   Nail Problem    np onchoymycosis    72 y.o. male presents with the above complaint. History confirmed with patient.   Objective:  Physical Exam: warm, good capillary refill, no trophic changes or ulcerative lesions, normal DP and PT pulses, and normal sensory exam. Left Foot: dystrophic yellowed discolored nail plates with subungual debris and severe hyperkeratosis medial first MTPJ Right Foot: dystrophic yellowed discolored nail plates with subungual debris    Assessment:   1. Pain due to onychomycosis of toenails of both feet   2. Callus of foot   3. Coagulation defect, unspecified ([place])   4. End stage renal disease ([place])      Plan:  Patient was evaluated and treated and all questions answered.  Discussed the etiology and treatment options for the condition in detail with the patient. Educated patient on the topical and oral treatment options for mycotic nails. Recommended debridement of the nails today. Sharp and mechanical debridement performed of all painful and mycotic nails today. Nails debrided in length and thickness using a nail nipper to level of comfort. Discussed treatment options including appropriate shoe gear. Follow up as needed for painful nails.  Rx for fluconazole modified dosing to use with hemodialysis was sent to pharmacy  All symptomatic hyperkeratoses were safely debrided with a sterile #15 blade to patient's level of comfort without incident. We discussed preventative and palliative care of these lesions including supportive and accommodative shoegear, padding, use of a pumice stone and lotions/creams daily.  Urea and salicylic acid cream recommended     Return in about 6 months (around 05/21/2023) for follow up after nail fungus treatment.

## 2022-11-20 NOTE — Patient Instructions (Signed)
Look for urea 40% with 2% salicylic acid cream or ointment and apply to the thickened dry skin / calluses. This can be bought over the counter, at a pharmacy or online such as [employer].  

## 2022-11-24 ENCOUNTER — Other Ambulatory Visit: Payer: Self-pay | Admitting: Nurse Practitioner

## 2022-11-24 DIAGNOSIS — C61 Malignant neoplasm of prostate: Secondary | ICD-10-CM

## 2022-12-30 DIAGNOSIS — M25561 Pain in right knee: Secondary | ICD-10-CM | POA: Insufficient documentation

## 2023-01-14 ENCOUNTER — Other Ambulatory Visit: Payer: Self-pay | Admitting: Podiatry

## 2023-01-19 ENCOUNTER — Telehealth: Payer: Self-pay

## 2023-01-19 MED ORDER — FLUCONAZOLE 150 MG PO TABS: 150.0000 mg | ORAL_TABLET | ORAL | 0 refills | Status: AC

## 2023-01-19 NOTE — Telephone Encounter (Signed)
On his dialysis days correct? That is what I was telling him since he does dialysis 3 times a week.

## 2023-01-19 NOTE — Telephone Encounter (Signed)
Patient is aware 

## 2023-01-19 NOTE — Telephone Encounter (Signed)
Patient is going to need refills by next week due to him taking the medication incorrectly at the beginning.

## 2023-01-19 NOTE — Addendum Note (Signed)
Addended byLilian Kapur, Tydus Sanmiguel R on: 01/19/2023 02:50 PM   Modules accepted: Orders

## 2023-01-21 ENCOUNTER — Ambulatory Visit
Admission: RE | Admit: 2023-01-21 | Discharge: 2023-01-21 | Disposition: A | Discharge status: Home / Self Care | Payer: Medicare Other | Source: Ambulatory Visit | Attending: Nurse Practitioner | Admitting: Nurse Practitioner

## 2023-01-21 DIAGNOSIS — C61 Malignant neoplasm of prostate: Secondary | ICD-10-CM

## 2023-01-21 MED ORDER — GADOPICLENOL 0.5 MMOL/ML IV SOLN
9.0000 mL | Freq: Once | INTRAVENOUS | Status: AC | PRN
  Administered 2023-01-21: 9 mL via INTRAVENOUS

## 2023-03-17 ENCOUNTER — Encounter: Payer: Self-pay | Admitting: Physician Assistant

## 2023-03-25 ENCOUNTER — Ambulatory Visit: Payer: Medicare Other | Admitting: Physician Assistant

## 2023-03-25 ENCOUNTER — Encounter: Payer: Self-pay | Admitting: Physician Assistant

## 2023-03-25 VITALS — BP 102/61 | HR 96 | Resp 18 | Ht 73.0 in | Wt 211.0 lb

## 2023-03-25 DIAGNOSIS — R4789 Other speech disturbances: Secondary | ICD-10-CM | POA: Diagnosis not present

## 2023-03-25 DIAGNOSIS — R404 Transient alteration of awareness: Secondary | ICD-10-CM | POA: Diagnosis not present

## 2023-03-25 NOTE — Progress Notes (Addendum)
[facility] Clinic Note - Initial Visit   Date: 03/25/23  Manuel Ortega MRN: [id number] DOB: [birth year]       IMPRESSION/PLAN:  Speech difficulty, single episode.  Weakness Concerning for TIA versus polypharmacy  This  episode was preceded by increase in Zoloft  dose and Ambien. No LOC or seizure like activity. No further events. He is asymptomatic for stroke or seizures.  MRI brain to evaluate for structural abnormalities and vascular load.  Carotid dopplers   Pending  on results will consider other cardiac studies  Recommend resuming Psychotherapy for anxiety Recommend going over meds with prescribing provider. He discontinued Ambien, Zoloft, Seroquel and Depakote at once 4 days ago. Recommend good control of cardiovascular risk factors.   Return to clinic in 3 months.   History of Present Illness:  Manuel Ortega is a 72 y.o. R-handed male with a history of hypertension, ESRD on HD 3 times a week, anemia of chronic disease, anxiety, depression, glaucoma, and a history of prostate enlargement,  presenting for evaluation of an episode difficulty speaking. The patient reports that he noticed "jumbled words" as he was talking out loud to himself.  He laid down for about 5 minutes, and the symptoms   resolved without recurrence.  He denied any facial droop, muscle weakness, numbness or tingling.  Patient  never had a similar episode. Denies any history of TIA. Denies vertigo dizziness. Denies headaches, or dysphagia. No confusion or seizures. Denies any chest pain, or shortness of breath. Denies any fever or chills, or night sweats. No tobacco. +new changes in the meds prior to the event "possibly, cannot keep up with it, because they are to many". "I just took myself out of them at once for the last 2 days to clean myself from them". No hormonal supplements. Does not take a regular ASA a day or other anticoagulation. Denies any recent long distance trips or  recent surgeries. No sick contacts.  He does report a strong family of stroke on his mother and father.  He reports significant stress in his life lately, especially with his daughter whom he is estranged  Patient is no very active despite having a membership to a local gym. He is not a diabetic. No memory loss. .Sleeps well recently took himself from Ambien, right before the episode. No ETOH or tobacco or recreational drugs. He is an Art gallery manager.     Past Medical History:  Diagnosis Date   AKI (acute kidney injury) (HCC) 05/2020   possibly due to prostate blockage   Anxiety    Arthritis    Depression    Dyspnea    Glaucoma    Hypertension    Prostate cancer [facility])     Past Surgical History:  Procedure Laterality Date   growth in nostril     biopsied negatiove   IR FLUORO GUIDE CV LINE RIGHT  05/12/2020   IR FLUORO GUIDE CV LINE RIGHT  05/28/2020   IR US GUIDE VASC ACCESS RIGHT  05/12/2020   IR US GUIDE VASC ACCESS RIGHT  05/28/2020   TRANSURETHRAL RESECTION OF PROSTATE N/A 09/13/2020   Procedure: TRANSURETHRAL RESECTION OF THE PROSTATE (TURP) WITH CYSTOSCOPY;  Surgeon: Heloise Purpura, MD;  Location: [facility];  Service: Urology;  Laterality: N/A;     Medications:  Outpatient Encounter Medications as of 03/25/2023  Medication Sig Note   amLODipine (NORVASC) 10 MG tablet Take by mouth.    atorvastatin (LIPITOR) 10 MG tablet Take 10 mg by mouth daily.  azelastine (ASTELIN) 0.1 % nasal spray one spray by Both Nostrils route 2 (two) times daily. Use in each nostril as directed    benzonatate (TESSALON) 100 MG capsule Take 100 mg by mouth 3 (three) times daily as needed.    calcium carbonate (TUMS) 500 MG chewable tablet Chew by mouth.    ciprofloxacin (CIPRO) 250 MG tablet Take 250 mg by mouth daily.    clotrimazole-betamethasone (LOTRISONE) cream Apply 1 Application topically daily.    diphenhydrAMINE (BENADRYL) 25 mg capsule Take 50 mg by mouth every 4 (four) hours as needed for itching or  allergies.     divalproex (DEPAKOTE SPRINKLE) 125 MG capsule Take by mouth.    docusate calcium (SURFAK) 240 MG capsule Take by mouth.    docusate sodium (COLACE) 100 MG capsule Take 1 capsule (100 mg total) by mouth 2 (two) times daily.    ferric citrate (AURYXIA) 1 GM 210 MG(Fe) tablet Take 420 mg by mouth 3 (three) times daily with meals.  09/13/2020: Will restart after procedure   fluconazole (DIFLUCAN) 150 MG tablet Take 1 tablet (150 mg total) by mouth 3 (three) times a week. After dialysis    FLUoxetine (PROZAC) 40 MG capsule Take 1 capsule (40 mg total) by mouth daily.    fluticasone (FLONASE) 50 MCG/ACT nasal spray Place 1 spray into both nostrils daily as needed for allergies or rhinitis.    ketoconazole (NIZORAL) 2 % cream Apply topically daily.    latanoprost (XALATAN) 0.005 % ophthalmic solution Place 1 drop into the right eye at bedtime.  09/13/2020: Needs refill   LOPERAMIDE HCL PO Take by mouth.    magnesium oxide (MAG-OX) 400 (241.3 Mg) MG tablet Take 1 tablet (400 mg total) by mouth daily.    Magnesium Oxide 400 MG CAPS Take by mouth.    melatonin 5 MG TABS Take 1 tablet (5 mg total) by mouth at bedtime. (Patient taking differently: Take 5 mg by mouth at bedtime as needed (sleep).)    Nutritional Supplements (FEEDING SUPPLEMENT, NEPRO CARB STEADY,) LIQD Take 237 mLs by mouth 2 (two) times daily between meals.    Psyllium 0.36 g CAPS Take by mouth.    QUEtiapine (SEROQUEL) 200 MG tablet Take 200 mg by mouth at bedtime.  09/13/2020: Needs refills   SENSIPAR 30 MG tablet Take by mouth.    sertraline (ZOLOFT) 100 MG tablet Take 100 mg by mouth 2 (two) times daily.    sildenafil (VIAGRA) 100 MG tablet SMARTSIG:0.5 Tablet(s) By Mouth As Directed    tamsulosin (FLOMAX) 0.4 MG CAPS capsule Take 2 capsules (0.8 mg total) by mouth daily after supper.    timolol (TIMOPTIC) 0.5 % ophthalmic solution Place 1 drop into the right eye in the morning and at bedtime.     zolpidem (AMBIEN) 5 MG  tablet Take 1 tablet (5 mg total) by mouth at bedtime as needed for sleep.    No facility-administered encounter medications on file as of 03/25/2023.    Allergies:  Allergies  Allergen Reactions   Penicillins Other (See Comments)    unknown    Family History: Family History  Problem Relation Age of Onset   Stroke Mother    Breast cancer Neg Hx    Colon cancer Neg Hx    Prostate cancer Neg Hx    Pancreatic cancer Neg Hx     Social History: Social History   Tobacco Use   Smoking status: Never   Smokeless tobacco: Never  Vaping Use  Vaping Use: Never used  Substance Use Topics   Alcohol use: Yes    Alcohol/week: 1.0 standard drink of alcohol    Types: 1 Glasses of wine per week    Comment: rare   Drug use: Never   Social History   Social History Narrative   Right handed   Drinks caffeine   Lives here in [place] for 6 years, came from [place]   Lives alone    Vital Signs:  BP 102/61   Pulse 96   Resp 18   Ht 6\' 1"  (1.854 m)   Wt 211 lb (95.7 kg)   SpO2 98%   BMI 27.84 kg/m    General Medical Exam:   General:  Well appearing,  anxious appearing Eyes/ENT: see cranial nerve examination.   Neck:   No carotid bruits. Respiratory:  Clear to auscultation, good air entry bilaterally.   Cardiac:  Regular rate and rhythm, no murmur.   Extremities:  No deformities, edema, or skin discoloration.  Skin:  No rashes or lesions.  Neurological Exam:  MENTAL STATUS including orientation to time, place, person, recent and remote memory, attention span and concentration, language, and fund of knowledge is normal.  Speech is not dysarthric.  CRANIAL NERVES: II:  No visual field defects.  Unremarkable fundi.   III-IV-VI: Pupils equal round and reactive to light.  Normal conjugate, extra-ocular eye movements in all directions of gaze.  No nystagmus.  No ptosis.   V:  Normal facial sensation.    VII:  Normal facial symmetry and movements.   VIII:  Normal hearing and  vestibular function.   IX-X:  Normal palatal movement.   XI:  Normal shoulder shrug and head rotation.   XII:  Normal tongue strength and range of motion, no deviation or fasciculation.  MOTOR:  No atrophy, fasciculations or abnormal movements.  No pronator drift.    SENSORY:  Normal and symmetric perception of light touch, pinprick, vibration, and proprioception.  Romberg's sign absent.   COORDINATION/GAIT: Normal finger-to- nose-finger and heel-to-shin.  Intact rapid alternating movements bilaterally.  Able to rise from a chair without using arms.  Gait narrow based and stable. Tandem and stressed gait intact.     Total time spent:  70 mins   Thank you for allowing me to participate in patient's care.  If I can answer any additional questions, I would be pleased to do so.    Sincerely,   Marlowe Kays, PA-C

## 2023-03-25 NOTE — Patient Instructions (Addendum)
  MRI brain  EEG Carotid dopplers   Recommend resuming Psychoherapy and meds Follow up in 6 weeks  Recommend good control of cardiovascular risk factors.    [facility] [phone] for cartoid dopplers and MRI

## 2023-03-26 ENCOUNTER — Ambulatory Visit (INDEPENDENT_AMBULATORY_CARE_PROVIDER_SITE_OTHER): Payer: Medicare Other | Admitting: Neurology

## 2023-03-26 DIAGNOSIS — R404 Transient alteration of awareness: Secondary | ICD-10-CM | POA: Diagnosis not present

## 2023-03-26 NOTE — Progress Notes (Signed)
EEG complete - results pending 

## 2023-03-27 ENCOUNTER — Other Ambulatory Visit: Payer: Medicare Other

## 2023-03-31 ENCOUNTER — Ambulatory Visit
Admission: RE | Admit: 2023-03-31 | Discharge: 2023-03-31 | Disposition: A | Discharge status: Home / Self Care | Payer: Medicare Other | Source: Ambulatory Visit | Attending: Physician Assistant | Admitting: Physician Assistant

## 2023-03-31 DIAGNOSIS — R4789 Other speech disturbances: Secondary | ICD-10-CM

## 2023-04-01 NOTE — Progress Notes (Signed)
Neck ultrasound of the arteries show Minimal narrowing of the carotid arteries with good flow, without causing occlusion. thanks

## 2023-04-08 NOTE — Procedures (Signed)
ELECTROENCEPHALOGRAM REPORT  Date of Study: 03/26/2023  Patient's Name: Manuel Ortega MRN: [id number] Date of Birth: 07-29-1951  Referring Provider: Marlowe Kays, PA-C  Clinical History: This is a 72 year old man with episode of difficulty speaking. EEG for classification.  Medications: Depakote, Seroquel, Prozac, Zoloft, Lipitor, Norvasc  Technical Summary: A multichannel digital 1-hour EEG recording measured by the international 10-20 system with electrodes applied with paste and impedances below 5000 ohms performed in our laboratory with EKG monitoring in an awake and asleep patient.  Hyperventilation was not performed. Photic stimulation was performed.  The digital EEG was referentially recorded, reformatted, and digitally filtered in a variety of bipolar and referential montages for optimal display.    Description: The patient is awake and asleep during the recording.  During maximal wakefulness, there is a symmetric, medium voltage 8-9 Hz posterior dominant rhythm that attenuates with eye opening.  The record is symmetric.  During drowsiness and sleep, there is an increase in theta slowing of the background.  Vertex waves and symmetric sleep spindles were seen. Photic stimulation did not elicit any abnormalities.  There were no epileptiform discharges or electrographic seizures seen.    EKG lead was unremarkable.  Impression: This 1-hour awake and asleep EEG is normal.    Clinical Correlation: A normal EEG does not exclude a clinical diagnosis of epilepsy.  If further clinical questions remain, prolonged EEG may be helpful.  Clinical correlation is advised.   Patrcia Dolly, M.D.

## 2023-04-08 NOTE — Progress Notes (Signed)
EEG is normal, no seizure, thanks

## 2023-04-13 ENCOUNTER — Other Ambulatory Visit: Payer: Self-pay | Admitting: Podiatry

## 2023-05-05 ENCOUNTER — Other Ambulatory Visit: Payer: Medicare Other

## 2023-05-06 ENCOUNTER — Ambulatory Visit: Payer: Medicare Other | Admitting: Physician Assistant

## 2023-05-06 ENCOUNTER — Encounter: Payer: Self-pay | Admitting: Physician Assistant

## 2023-05-09 ENCOUNTER — Encounter (HOSPITAL_COMMUNITY): Payer: Self-pay

## 2023-05-09 ENCOUNTER — Emergency Department (HOSPITAL_COMMUNITY)
Admission: EM | Admit: 2023-05-09 | Discharge: 2023-05-09 | Disposition: A | Discharge status: Home / Self Care | Payer: Medicare Other | Attending: Emergency Medicine | Admitting: Emergency Medicine

## 2023-05-09 ENCOUNTER — Other Ambulatory Visit: Payer: Self-pay

## 2023-05-09 DIAGNOSIS — E861 Hypovolemia: Secondary | ICD-10-CM | POA: Insufficient documentation

## 2023-05-09 DIAGNOSIS — Z992 Dependence on renal dialysis: Secondary | ICD-10-CM | POA: Diagnosis not present

## 2023-05-09 DIAGNOSIS — I9589 Other hypotension: Secondary | ICD-10-CM | POA: Diagnosis not present

## 2023-05-09 DIAGNOSIS — E860 Dehydration: Secondary | ICD-10-CM

## 2023-05-09 DIAGNOSIS — R531 Weakness: Secondary | ICD-10-CM | POA: Diagnosis present

## 2023-05-09 DIAGNOSIS — N189 Chronic kidney disease, unspecified: Secondary | ICD-10-CM | POA: Diagnosis not present

## 2023-05-09 LAB — BASIC METABOLIC PANEL
Anion gap: 10 (ref 5–15)
BUN: 14 mg/dL (ref 8–23)
CO2: 31 mmol/L (ref 22–32)
Calcium: 8 mg/dL — ABNORMAL LOW (ref 8.9–10.3)
Chloride: 97 mmol/L — ABNORMAL LOW (ref 98–111)
Creatinine, Ser: 2.89 mg/dL — ABNORMAL HIGH (ref 0.61–1.24)
GFR, Estimated: 23 mL/min — ABNORMAL LOW (ref >60)
Glucose, Bld: 105 mg/dL — ABNORMAL HIGH (ref 70–99)
Potassium: 3.4 mmol/L — ABNORMAL LOW (ref 3.5–5.1)
Sodium: 138 mmol/L (ref 135–145)

## 2023-05-09 LAB — URINALYSIS, ROUTINE W REFLEX MICROSCOPIC
Bilirubin Urine: NEGATIVE
Glucose, UA: NEGATIVE mg/dL
Hgb urine dipstick: NEGATIVE
Ketones, ur: NEGATIVE mg/dL
Nitrite: NEGATIVE
Protein, ur: 100 mg/dL — AB
Specific Gravity, Urine: 1.009 (ref 1.005–1.030)
WBC, UA: >50 WBC/hpf (ref 0–5)
pH: 8 (ref 5.0–8.0)

## 2023-05-09 LAB — CBC
HCT: 27.2 % — ABNORMAL LOW (ref 39.0–52.0)
Hemoglobin: 8.8 g/dL — ABNORMAL LOW (ref 13.0–17.0)
MCH: 29.2 pg (ref 26.0–34.0)
MCHC: 32.4 g/dL (ref 30.0–36.0)
MCV: 90.4 fL (ref 80.0–100.0)
Platelets: 281 10*3/uL (ref 150–400)
RBC: 3.01 MIL/uL — ABNORMAL LOW (ref 4.22–5.81)
RDW: 14.8 % (ref 11.5–15.5)
WBC: 9.3 10*3/uL (ref 4.0–10.5)
nRBC: 0 % (ref 0.0–0.2)

## 2023-05-09 LAB — POC OCCULT BLOOD, ED: Fecal Occult Bld: NEGATIVE

## 2023-05-09 MED ORDER — SODIUM CHLORIDE 0.9 % IV BOLUS
500.0000 mL | Freq: Once | INTRAVENOUS | Status: AC
  Administered 2023-05-09: 500 mL via INTRAVENOUS

## 2023-05-09 NOTE — ED Provider Notes (Signed)
[place] EMERGENCY DEPARTMENT AT [facility] Provider Note   CSN: [id number] Arrival date & time: 05/09/23  1052     History {Add pertinent medical, surgical, social history, OB history to HPI:1} Chief Complaint  Patient presents with   Hypotension    Manuel Ortega is a 72 y.o. male.  Patient has a history of renal failure and is on dialysis.  He had his dialysis today and they stated his blood pressure was low so he came to the emergency department.  Patient states he is lost a lot of weight recently   Weakness      Home Medications Prior to Admission medications   Medication Sig Start Date End Date Taking? Authorizing Provider  amLODipine (NORVASC) 10 MG tablet Take by mouth. 03/18/22   [provider]  atorvastatin (LIPITOR) 10 MG tablet Take 10 mg by mouth daily.    [provider]  azelastine (ASTELIN) 0.1 % nasal spray one spray by Both Nostrils route 2 (two) times daily. Use in each nostril as directed 09/17/22 09/17/23  [provider]  benzonatate (TESSALON) 100 MG capsule Take 100 mg by mouth 3 (three) times daily as needed. 09/17/22   [provider]  calcium carbonate (TUMS) 500 MG chewable tablet Chew by mouth. 03/18/22   [provider]  ciprofloxacin (CIPRO) 250 MG tablet Take 250 mg by mouth daily. 10/17/22   [provider]  clotrimazole-betamethasone (LOTRISONE) cream APPLY 1 APPLICATION TOPICALLY DAILY 04/13/23   Edwin Cap, DPM  diphenhydrAMINE (BENADRYL) 25 mg capsule Take 50 mg by mouth every 4 (four) hours as needed for itching or allergies.     [provider]  divalproex (DEPAKOTE SPRINKLE) 125 MG capsule Take by mouth. 06/12/20   [provider]  docusate calcium (SURFAK) 240 MG capsule Take by mouth. 06/12/20   [provider]  docusate sodium (COLACE) 100 MG capsule Take 1 capsule (100 mg total) by mouth 2 (two) times daily. 06/01/20   Pokhrel, Rebekah Chesterfield, MD  ferric  citrate (AURYXIA) 1 GM 210 MG(Fe) tablet Take 420 mg by mouth 3 (three) times daily with meals.     [provider]  fluconazole (DIFLUCAN) 150 MG tablet Take 1 tablet (150 mg total) by mouth 3 (three) times a week. After dialysis 01/19/23 05/19/23  Edwin Cap, DPM  FLUoxetine (PROZAC) 40 MG capsule Take 1 capsule (40 mg total) by mouth daily. 06/02/20   Pokhrel, Rebekah Chesterfield, MD  fluticasone (FLONASE) 50 MCG/ACT nasal spray Place 1 spray into both nostrils daily as needed for allergies or rhinitis.    [provider]  ketoconazole (NIZORAL) 2 % cream Apply topically daily. 11/17/22 11/17/23  [provider]  latanoprost (XALATAN) 0.005 % ophthalmic solution Place 1 drop into the right eye at bedtime.  12/19/19   [provider]  LOPERAMIDE HCL PO Take by mouth. 10/25/22 10/24/23  [provider]  magnesium oxide (MAG-OX) 400 (241.3 Mg) MG tablet Take 1 tablet (400 mg total) by mouth daily. 06/02/20   Pokhrel, Rebekah Chesterfield, MD  Magnesium Oxide 400 MG CAPS Take by mouth. 06/12/20   [provider]  melatonin 5 MG TABS Take 1 tablet (5 mg total) by mouth at bedtime. Patient taking differently: Take 5 mg by mouth at bedtime as needed (sleep). 06/01/20   Pokhrel, Rebekah Chesterfield, MD  Nutritional Supplements (FEEDING SUPPLEMENT, NEPRO CARB STEADY,) LIQD Take 237 mLs by mouth 2 (two) times daily between meals. 06/01/20   Pokhrel, Rebekah Chesterfield, MD  Psyllium  0.36 g CAPS Take by mouth. 06/12/20   [provider]  QUEtiapine (SEROQUEL) 200 MG tablet Take 200 mg by mouth at bedtime.  11/14/19   [provider]  SENSIPAR 30 MG tablet Take by mouth. 09/01/22   [provider]  sertraline (ZOLOFT) 100 MG tablet Take 100 mg by mouth 2 (two) times daily.    [provider]  sildenafil (VIAGRA) 100 MG tablet SMARTSIG:0.5 Tablet(s) By Mouth As Directed 10/29/22   [provider]  tamsulosin (FLOMAX) 0.4 MG CAPS capsule Take 2 capsules (0.8 mg total) by mouth  daily after supper. 06/01/20   Pokhrel, Rebekah Chesterfield, MD  timolol (TIMOPTIC) 0.5 % ophthalmic solution Place 1 drop into the right eye in the morning and at bedtime.  07/14/19   [provider]  zolpidem (AMBIEN) 5 MG tablet Take 1 tablet (5 mg total) by mouth at bedtime as needed for sleep. 06/13/20   Narda Bonds, MD      Allergies    Penicillins    Review of Systems   Review of Systems  Neurological:  Positive for weakness.    Physical Exam Updated Vital Signs BP 111/68 (BP Location: Left Arm)   Pulse 86   Temp 98 F (36.7 C) (Oral)   Resp 19   Ht 6\' 1"  (1.854 m)   Wt 96 kg   SpO2 100%   BMI 27.92 kg/m  Physical Exam  ED Results / Procedures / Treatments   Labs (all labs ordered are listed, but only abnormal results are displayed) Labs Reviewed  BASIC METABOLIC PANEL - Abnormal; Notable for the following components:      Result Value   Potassium 3.4 (*)    Chloride 97 (*)    Glucose, Bld 105 (*)    Creatinine, Ser 2.89 (*)    Calcium 8.0 (*)    GFR, Estimated 23 (*)    All other components within normal limits  CBC - Abnormal; Notable for the following components:   RBC 3.01 (*)    Hemoglobin 8.8 (*)    HCT 27.2 (*)    All other components within normal limits  URINALYSIS, ROUTINE W REFLEX MICROSCOPIC  POC OCCULT BLOOD, ED    EKG None  Radiology No results found.  Procedures Procedures  {Document cardiac monitor, telemetry assessment procedure when appropriate:1}  Medications Ordered in ED Medications  sodium chloride 0.9 % bolus 500 mL (0 mLs Intravenous Stopped 05/09/23 1316)    ED Course/ Medical Decision Making/ A&P   {   Click here for ABCD2, HEART and other calculatorsREFRESH Note before signing :1}                          Medical Decision Making Amount and/or Complexity of Data Reviewed Labs: ordered.   Hypotension secondary to dehydration.  We will stop his blood pressure medicine and have him follow-up with his PCP  {Document  critical care time when appropriate:1} {Document review of labs and clinical decision tools ie heart score, Chads2Vasc2 etc:1}  {Document your independent review of radiology images, and any outside records:1} {Document your discussion with family members, caretakers, and with consultants:1} {Document social determinants of health affecting pt's care:1} {Document your decision making why or why not admission, treatments were needed:1} Final Clinical Impression(s) / ED Diagnoses Final diagnoses:  Dehydration  Hypotension due to hypovolemia    Rx / DC Orders ED Discharge Orders     None

## 2023-05-09 NOTE — ED Notes (Addendum)
Pt aware that a urine sample is needed. He said he would probably be able to give one after the fluid bolus.

## 2023-05-09 NOTE — Discharge Instructions (Signed)
Stop taking your blood pressure medicine.  Follow-up with your family doctor next week for recheck

## 2023-05-09 NOTE — ED Triage Notes (Signed)
Patient said he went to dialysis this morning. After treatment it was down to 90/80. Feels dizzy. Denies pain.

## 2023-05-11 DIAGNOSIS — J302 Other seasonal allergic rhinitis: Secondary | ICD-10-CM | POA: Insufficient documentation

## 2023-05-14 ENCOUNTER — Ambulatory Visit (INDEPENDENT_AMBULATORY_CARE_PROVIDER_SITE_OTHER): Payer: Medicare Other | Admitting: Physician Assistant

## 2023-05-14 ENCOUNTER — Encounter: Payer: Self-pay | Admitting: Physician Assistant

## 2023-05-14 ENCOUNTER — Other Ambulatory Visit (INDEPENDENT_AMBULATORY_CARE_PROVIDER_SITE_OTHER): Payer: Medicare Other

## 2023-05-14 VITALS — BP 134/77 | HR 120 | Resp 20 | Ht 73.0 in | Wt 209.0 lb

## 2023-05-14 DIAGNOSIS — E785 Hyperlipidemia, unspecified: Secondary | ICD-10-CM | POA: Diagnosis not present

## 2023-05-14 DIAGNOSIS — R739 Hyperglycemia, unspecified: Secondary | ICD-10-CM

## 2023-05-14 DIAGNOSIS — G459 Transient cerebral ischemic attack, unspecified: Secondary | ICD-10-CM

## 2023-05-14 LAB — LIPID PANEL
Cholesterol: 140 mg/dL (ref 0–200)
HDL: 45 mg/dL (ref >39.00)
LDL Cholesterol: 72 mg/dL (ref 0–99)
NonHDL: 94.71
Total CHOL/HDL Ratio: 3
Triglycerides: 115 mg/dL (ref 0.0–149.0)
VLDL: 23 mg/dL (ref 0.0–40.0)

## 2023-05-14 LAB — HEMOGLOBIN A1C: Hgb A1c MFr Bld: 5.6 % (ref 4.6–6.5)

## 2023-05-14 NOTE — Patient Instructions (Addendum)
  2D Echo Check labs  Recommend resuming Psychoherapy   Follow up as needed  [facility] for Echo Labs today [address]

## 2023-05-14 NOTE — Progress Notes (Signed)
NEUROLOGY FOLLOW UP OFFICE NOTE  Manuel Ortega [id number]  Assessment/Plan:   Speech difficulty, single episode.  Weakness Concerning for TIA versus polypharmacy   This  episode was preceded by increase in Zoloft  dose and Ambien. No LOC or seizure like activity. No further events. He is asymptomatic for stroke or seizures.  EEG was normal, without epileptic wave activity.  Carotid Dopplers were essentially normal.  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 HE denies any stroke like symptoms and denies any new neurological complaints. His memory is "excellent". He is physically active and able to perform his ADLs without difficulty. He is not on baby ASA, recommend to discuss with renal provider if OK to use.   Recommendations   Check 2 D echo, lipid panel and A1C to complete the stroke workup.  Recommend resuming Psychotherapy for anxiety Follow-up with psychiatry Recommend going over meds with prescribing provider.   Recommend good control of cardiovascular risk factors.   Follow up as needed     Subjective:    Memory Changes    Any changes in memory ?" My memory is great, no idea how". "No random images anymore, total clarity". Has not been on psychotherapy or psychiatry. 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 "Writing a lot and the vocabulary is great "-he says  repeats oneself? Denies  Disoriented when walking into a room?  Patient denies . He is cleaning out his house, "to clean my life" Leaving objects in unusual places?  Patient denies   Wandering behavior?   denies   Any personality changes since last visit? Feeling better . Wants to have a relationship.   Any worsening depression?: I don't feel depressed.   Hallucinations or paranoia?   denies   Seizures?   denies    Any sleep changes? Sleeps well . Positive dreams.  Denies REM behavior or sleepwalking   Sleep apnea?   denies    Any hygiene concerns?   denies   Independent of bathing and dressing?  Endorsed  Does the patient needs help with medications? Patient is in charge   Who is in charge of the finances?  Patient is in charge     Any changes in appetite?  "I am eating again!" .    Patient have trouble swallowing?  denies   Does the patient cook?  No  Any kitchen accidents such as leaving the stove on?   denies   Any headaches?    denies   Vision changes? denies Chronic pain?  denies   Ambulates with difficulty? Motivated in exercising     Recent falls or head injuries?    denies      Unilateral weakness, numbness or tingling?   denies   Any tremors?  denies   Any anosmia?    denies   Any incontinence of urine?  denies   Any bowel dysfunction?  Occasional diarrhea  Patient lives alone  Does the patient drive?yes.   Update : No further dysarthria, no other stroke like symptoms. Denies vertigo dizziness or vision changes. Denies headaches, dysarthria or dysphagia. No confusion or seizures. Denies any chest pain, or shortness of breath. Denies any fever or chills, or night sweats. No tobacco. No new meds or hormonal supplements. No sick contacts.  Patient is compliant with his medications. .Patient is very active, exercising daily.    Initial visit 03/25/2023 Manuel Ortega is a 72 y.o. R-handed male with a history of hypertension, ESRD on HD 3 times a week, anemia of chronic disease, glaucoma, and a history of prostate enlargement, moderate recurrent major depression, generalized anxiety disorder, presenting for evaluation of an episode difficulty speaking. The patient reports that he noticed "jumbled words" as he was talking out loud to himself.  He laid down for about 5 minutes, and the symptoms   resolved without recurrence.  He denied any facial droop, muscle  weakness, numbness or tingling.  Patient  never had a similar episode. Denies any history of TIA. Denies vertigo dizziness. Denies headaches, or dysphagia. No confusion or seizures. Denies any chest pain, or shortness of breath. Denies any fever or chills, or night sweats. No tobacco. +new changes in the meds prior to the event "possibly, cannot keep up with it, because they are to many". "I just took myself out of them at once for the last 2 days to clean myself from them". No hormonal supplements. Does not take a regular ASA a day or other anticoagulation. Denies any recent long distance trips or recent surgeries. No sick contacts.  He does report a strong family of stroke on his mother and father.  He reports significant stress in his life lately, especially with his daughter whom he is estranged  Patient is no very active despite having a membership to a local gym. He is not a diabetic. No memory loss. .Sleeps well recently took himself from Ambien, right before the episode. No ETOH or tobacco or recreational drugs. He is an Art gallery manager.       PAST MEDICAL HISTORY: Past Medical History:  Diagnosis Date   AKI (acute kidney injury) (HCC) 05/2020   possibly due to prostate blockage   Anxiety    Arthritis    Depression    Dyspnea    Glaucoma    Hypertension    Prostate cancer [facility])     MEDICATIONS: Current Outpatient Medications on File Prior to Visit  Medication Sig Dispense Refill   atorvastatin (LIPITOR) 10 MG tablet Take 10 mg by mouth daily.     azelastine (ASTELIN) 0.1 % nasal spray one spray by Both Nostrils route 2 (two) times daily. Use in each nostril as directed     benzonatate (TESSALON) 100 MG capsule Take 100 mg by mouth 3 (three) times daily as needed.     calcium carbonate (TUMS) 500 MG chewable tablet Chew by mouth.     ciprofloxacin (CIPRO) 250 MG tablet Take 250 mg by mouth daily.     clotrimazole-betamethasone (LOTRISONE) cream APPLY 1 APPLICATION TOPICALLY DAILY 45 g 0    diphenhydrAMINE (BENADRYL) 25 mg capsule Take 50 mg by mouth every 4 (four) hours as needed for itching or allergies.      divalproex (DEPAKOTE SPRINKLE) 125 MG capsule Take by mouth.     docusate calcium (SURFAK) 240 MG capsule Take by mouth.     docusate sodium (COLACE) 100 MG capsule Take 1 capsule (100 mg total) by mouth 2 (two) times daily.     ferric citrate (AURYXIA) 1 GM 210 MG(Fe) tablet Take 420 mg by mouth 3 (three) times daily with meals.      fluconazole (DIFLUCAN) 150 MG tablet Take 1 tablet (150 mg total) by mouth 3 (three) times a week. After dialysis 51 tablet 0   FLUoxetine (PROZAC) 40 MG capsule  Take 1 capsule (40 mg total) by mouth daily. (Patient taking differently: Take 50 mg by mouth 2 (two) times daily.)     fluticasone (FLONASE) 50 MCG/ACT nasal spray Place 1 spray into both nostrils daily as needed for allergies or rhinitis.     ketoconazole (NIZORAL) 2 % cream Apply topically daily.     latanoprost (XALATAN) 0.005 % ophthalmic solution Place 1 drop into the right eye at bedtime.      LOPERAMIDE HCL PO Take by mouth.     magnesium oxide (MAG-OX) 400 (241.3 Mg) MG tablet Take 1 tablet (400 mg total) by mouth daily. 30 tablet    Magnesium Oxide 400 MG CAPS Take by mouth.     melatonin 5 MG TABS Take 1 tablet (5 mg total) by mouth at bedtime. (Patient taking differently: Take 5 mg by mouth at bedtime as needed (sleep).) 30 tablet 0   Nutritional Supplements (FEEDING SUPPLEMENT, NEPRO CARB STEADY,) LIQD Take 237 mLs by mouth 2 (two) times daily between meals.  0   Psyllium 0.36 g CAPS Take by mouth.     QUEtiapine (SEROQUEL) 200 MG tablet Take 200 mg by mouth at bedtime.      SENSIPAR 30 MG tablet Take by mouth.     sertraline (ZOLOFT) 100 MG tablet Take 100 mg by mouth 2 (two) times daily. 150mg  daily     sildenafil (VIAGRA) 100 MG tablet SMARTSIG:0.5 Tablet(s) By Mouth As Directed     tamsulosin (FLOMAX) 0.4 MG CAPS capsule Take 2 capsules (0.8 mg total) by mouth daily  after supper.     timolol (TIMOPTIC) 0.5 % ophthalmic solution Place 1 drop into the right eye in the morning and at bedtime.      zolpidem (AMBIEN) 5 MG tablet Take 1 tablet (5 mg total) by mouth at bedtime as needed for sleep. 5 tablet 0   amLODipine (NORVASC) 10 MG tablet Take by mouth. (Patient not taking: Reported on 05/14/2023)     No current facility-administered medications on file prior to visit.    ALLERGIES: Allergies  Allergen Reactions   Penicillins Other (See Comments)    unknown    FAMILY HISTORY: Family History  Problem Relation Age of Onset   Stroke Mother    Breast cancer Neg Hx    Colon cancer Neg Hx    Prostate cancer Neg Hx    Pancreatic cancer Neg Hx        Objective:      PHYSICAL EXAMINATION:    VITALS:   Vitals:   05/14/23 0913  BP: 134/77  Pulse: (!) 120  Resp: 20  SpO2: 97%  Weight: 209 lb (94.8 kg)  Height: 6\' 1"  (1.854 m)    GEN:  The patient appears stated age and is in NAD. HEENT:  Normocephalic, atraumatic.   Neurological examination:  General: NAD, well-groomed, appears stated age. Orientation: The patient is alert. Oriented to person, place and date Cranial nerves: There is good facial symmetry.The speech is fluent and clear. No aphasia or dysarthria. Fund of knowledge is appropriate. Recent memory impaired and remote memory is normal.  Attention and concentration are normal.  Able to name objects and repeat phrases.  Hearing is intact to conversational tone  Sensation: Sensation is intact to light touch throughout Motor: Strength is at least antigravity x4. DTR's 2/4 in UE/LE    Movement examination: Tone: There is normal tone in the UE/LE Abnormal movements:  no tremor.  No myoclonus.  No asterixis.  Coordination:  There is no decremation with RAM's. Normal finger to nose  Gait and Station: The patient has no difficulty arising out of a deep-seated chair without the use of the hands. The patient's stride length is good.   Gait is cautious and narrow.   Thank you for allowing [place] the opportunity to participate in the care of this nice patient. Please do not hesitate to contact us for any questions or concerns.   Total time spent on today's visit was 35 minutes dedicated to this patient today, preparing to see patient, examining the patient, ordering tests and/or medications and counseling the patient, documenting clinical information in the EHR or other health record, independently interpreting results and communicating results to the patient/family, discussing treatment and goals, answering patient's questions and coordinating care.  Cc:  Dixon Boos [facility] 05/14/2023 11:32 AM

## 2023-05-14 NOTE — Progress Notes (Signed)
Lab tests are normal. Will await for echo results. Thanks

## 2023-05-15 ENCOUNTER — Ambulatory Visit: Payer: Medicare Other | Admitting: Neurology

## 2023-05-21 ENCOUNTER — Ambulatory Visit: Payer: Medicare Other | Admitting: Podiatry

## 2023-06-09 ENCOUNTER — Ambulatory Visit (HOSPITAL_COMMUNITY): Payer: Medicare Other

## 2023-06-25 ENCOUNTER — Ambulatory Visit (HOSPITAL_COMMUNITY): Payer: Medicare Other | Attending: Internal Medicine

## 2023-06-25 DIAGNOSIS — G459 Transient cerebral ischemic attack, unspecified: Secondary | ICD-10-CM | POA: Insufficient documentation

## 2023-06-28 LAB — ECHOCARDIOGRAM COMPLETE
AR max vel: 2 cm2
AV Area VTI: 2.19 cm2
AV Area mean vel: 2.25 cm2
AV Mean grad: 7.2 mmHg
AV Peak grad: 12.2 mmHg
Ao pk vel: 1.75 m/s
Area-P 1/2: 4.93 cm2
P 1/2 time: 480 ms
S' Lateral: 2.5 cm

## 2023-06-29 NOTE — Progress Notes (Signed)
The echocardiogram is normal, thanks

## 2023-08-20 ENCOUNTER — Emergency Department (HOSPITAL_COMMUNITY)
Admission: EM | Admit: 2023-08-20 | Discharge: 2023-08-20 | Disposition: A | Discharge status: Home / Self Care | Payer: Medicare Other | Attending: Emergency Medicine | Admitting: Emergency Medicine

## 2023-08-20 DIAGNOSIS — K8689 Other specified diseases of pancreas: Secondary | ICD-10-CM | POA: Diagnosis not present

## 2023-08-20 DIAGNOSIS — D649 Anemia, unspecified: Secondary | ICD-10-CM | POA: Insufficient documentation

## 2023-08-20 DIAGNOSIS — Z79899 Other long term (current) drug therapy: Secondary | ICD-10-CM | POA: Diagnosis not present

## 2023-08-20 DIAGNOSIS — Z992 Dependence on renal dialysis: Secondary | ICD-10-CM | POA: Insufficient documentation

## 2023-08-20 DIAGNOSIS — R799 Abnormal finding of blood chemistry, unspecified: Secondary | ICD-10-CM | POA: Diagnosis present

## 2023-08-20 DIAGNOSIS — I10 Essential (primary) hypertension: Secondary | ICD-10-CM | POA: Diagnosis not present

## 2023-08-20 DIAGNOSIS — Z8546 Personal history of malignant neoplasm of prostate: Secondary | ICD-10-CM | POA: Insufficient documentation

## 2023-08-20 DIAGNOSIS — R899 Unspecified abnormal finding in specimens from other organs, systems and tissues: Secondary | ICD-10-CM

## 2023-08-20 DIAGNOSIS — K529 Noninfective gastroenteritis and colitis, unspecified: Secondary | ICD-10-CM | POA: Diagnosis not present

## 2023-08-20 DIAGNOSIS — N289 Disorder of kidney and ureter, unspecified: Secondary | ICD-10-CM | POA: Insufficient documentation

## 2023-08-20 LAB — COMPREHENSIVE METABOLIC PANEL
ALT: 15 U/L (ref 0–44)
AST: 19 U/L (ref 15–41)
Albumin: 3.7 g/dL (ref 3.5–5.0)
Alkaline Phosphatase: 71 U/L (ref 38–126)
Anion gap: 8 (ref 5–15)
BUN: 11 mg/dL (ref 8–23)
CO2: 30 mmol/L (ref 22–32)
Calcium: 9 mg/dL (ref 8.9–10.3)
Chloride: 98 mmol/L (ref 98–111)
Creatinine, Ser: 2.83 mg/dL — ABNORMAL HIGH (ref 0.61–1.24)
GFR, Estimated: 23 mL/min — ABNORMAL LOW (ref >60)
Glucose, Bld: 81 mg/dL (ref 70–99)
Potassium: 3.1 mmol/L — ABNORMAL LOW (ref 3.5–5.1)
Sodium: 136 mmol/L (ref 135–145)
Total Bilirubin: 0.3 mg/dL (ref <1.2)
Total Protein: 6.8 g/dL (ref 6.5–8.1)

## 2023-08-20 LAB — CBC WITH DIFFERENTIAL/PLATELET
Abs Immature Granulocytes: 0.06 10*3/uL (ref 0.00–0.07)
Basophils Absolute: 0.1 10*3/uL (ref 0.0–0.1)
Basophils Relative: 1 %
Eosinophils Absolute: 0.3 10*3/uL (ref 0.0–0.5)
Eosinophils Relative: 3 %
HCT: 41.3 % (ref 39.0–52.0)
Hemoglobin: 13 g/dL (ref 13.0–17.0)
Immature Granulocytes: 1 %
Lymphocytes Relative: 27 %
Lymphs Abs: 2.4 10*3/uL (ref 0.7–4.0)
MCH: 28.7 pg (ref 26.0–34.0)
MCHC: 31.5 g/dL (ref 30.0–36.0)
MCV: 91.2 fL (ref 80.0–100.0)
Monocytes Absolute: 0.8 10*3/uL (ref 0.1–1.0)
Monocytes Relative: 9 %
Neutro Abs: 5.3 10*3/uL (ref 1.7–7.7)
Neutrophils Relative %: 59 %
Platelets: 205 10*3/uL (ref 150–400)
RBC: 4.53 MIL/uL (ref 4.22–5.81)
RDW: 14.7 % (ref 11.5–15.5)
WBC: 8.9 10*3/uL (ref 4.0–10.5)
nRBC: 0 % (ref 0.0–0.2)

## 2023-08-20 LAB — IRON AND TIBC
Iron: 84 ug/dL (ref 45–182)
Saturation Ratios: 39 % (ref 17.9–39.5)
TIBC: 217 ug/dL — ABNORMAL LOW (ref 250–450)
UIBC: 133 ug/dL

## 2023-08-20 LAB — FERRITIN: Ferritin: 608 ng/mL — ABNORMAL HIGH (ref 24–336)

## 2023-08-20 LAB — RETICULOCYTES
Immature Retic Fract: 13.3 % (ref 2.3–15.9)
RBC.: 4.39 MIL/uL (ref 4.22–5.81)
Retic Count, Absolute: 48.7 10*3/uL (ref 19.0–186.0)
Retic Ct Pct: 1.1 % (ref 0.4–3.1)

## 2023-08-20 LAB — APTT: aPTT: 26 s (ref 24–36)

## 2023-08-20 LAB — PROTIME-INR
INR: 0.8 (ref 0.8–1.2)
Prothrombin Time: 11.6 s (ref 11.4–15.2)

## 2023-08-20 LAB — FOLATE: Folate: 6.7 ng/mL (ref >5.9)

## 2023-08-20 LAB — VITAMIN B12: Vitamin B-12: 302 pg/mL (ref 180–914)

## 2023-08-20 NOTE — ED Provider Notes (Signed)
[place] EMERGENCY DEPARTMENT AT [facility] Provider Note   CSN: [id number] Arrival date & time: 08/20/23  8413     History  Chief Complaint  Patient presents with   Abnormal Lab    Manuel Ortega is a 72 y.o. male with PMH as listed below who presents with abnormal lab. Pt reports that he was told by his MD that his hemoglobin was 6.0. Pt is a dialysis pt, just finished his dialysis treatment about 30 mins ago, received a full treatment.  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  He has not noted any bleeding from anywhere including dark or tarry stools or hematochezia.  He denies any abdominal pain, fatigue, shortness of breath, dizziness or lightheadedness.  He required blood transfusions x 3 when he was originally hospitalized before he started dialysis.  Otherwise is in his normal state of health.   Past Medical History:  Diagnosis Date   AKI (acute kidney injury) (HCC) 05/2020   possibly due to prostate blockage   Anxiety    Arthritis    Depression    Dyspnea    Glaucoma    Hypertension    Prostate cancer (HCC)        Home Medications Prior to Admission medications   Medication Sig Start Date End Date Taking? Authorizing Provider  amLODipine (NORVASC) 10 MG tablet Take by mouth. Patient not taking: Reported on 05/14/2023 03/18/22   [provider]  atorvastatin (LIPITOR) 10 MG tablet Take 10 mg by mouth daily.    [provider]  azelastine (ASTELIN) 0.1 % nasal spray one spray by Both Nostrils route 2 (two) times daily. Use in each nostril as directed 09/17/22 09/17/23  [provider]  benzonatate (TESSALON) 100 MG capsule Take 100 mg by mouth 3 (three) times daily as needed. 09/17/22   [provider]  calcium carbonate (TUMS) 500 MG chewable tablet Chew by mouth. 03/18/22   [provider]  ciprofloxacin (CIPRO) 250 MG tablet Take 250 mg by mouth daily. 10/17/22   [provider]  clotrimazole-betamethasone (LOTRISONE) cream APPLY 1 APPLICATION TOPICALLY DAILY 04/13/23   Edwin Cap, DPM  diphenhydrAMINE (BENADRYL) 25 mg capsule Take 50 mg by mouth every 4 (four) hours as needed for itching or allergies.     [provider]  divalproex (DEPAKOTE SPRINKLE) 125 MG capsule Take by mouth. 06/12/20   [provider]  docusate calcium (SURFAK) 240 MG capsule Take by mouth. 06/12/20   [provider]  docusate sodium (COLACE) 100 MG capsule Take 1 capsule (100 mg total) by mouth 2 (two) times daily. 06/01/20   Pokhrel, Rebekah Chesterfield, MD  ferric citrate (AURYXIA) 1 GM 210 MG(Fe) tablet Take 420 mg by mouth 3 (three) times daily with meals.     [provider]  FLUoxetine (PROZAC) 40 MG capsule Take 1 capsule (40 mg total) by mouth daily. Patient taking differently: Take 50 mg by mouth 2 (two) times daily. 06/02/20   Pokhrel, Rebekah Chesterfield, MD  fluticasone (FLONASE) 50 MCG/ACT nasal spray Place 1 spray into both nostrils daily as needed for allergies or rhinitis.    [provider]  ketoconazole (NIZORAL) 2 % cream Apply topically daily. 11/17/22 11/17/23  [provider]  latanoprost (XALATAN) 0.005 % ophthalmic solution Place 1 drop into the right eye at bedtime.  12/19/19   [provider]  LOPERAMIDE HCL PO Take by mouth. 10/25/22 10/24/23  [provider]  magnesium oxide (MAG-OX) 400 (241.3 Mg) MG tablet Take 1 tablet (400 mg total) by mouth daily. 06/02/20   Pokhrel, Rebekah Chesterfield, MD  Magnesium Oxide 400 MG CAPS Take by mouth. 06/12/20   [provider]  melatonin 5 MG TABS Take 1 tablet (5 mg total) by mouth at bedtime. Patient taking differently: Take 5 mg by mouth at bedtime as needed (sleep). 06/01/20   Pokhrel, Rebekah Chesterfield, MD  Nutritional Supplements (FEEDING SUPPLEMENT, NEPRO CARB STEADY,) LIQD Take 237 mLs by  mouth 2 (two) times daily between meals. 06/01/20   Pokhrel, Rebekah Chesterfield, MD  Psyllium 0.36 g CAPS Take by mouth. 06/12/20   [provider]  QUEtiapine (SEROQUEL) 200 MG tablet Take 200 mg by mouth at bedtime.  11/14/19   [provider]  SENSIPAR 30 MG tablet Take by mouth. 09/01/22   [provider]  sertraline (ZOLOFT) 100 MG tablet Take 100 mg by mouth 2 (two) times daily. 150mg  daily    [provider]  sildenafil (VIAGRA) 100 MG tablet SMARTSIG:0.5 Tablet(s) By Mouth As Directed 10/29/22   [provider]  tamsulosin (FLOMAX) 0.4 MG CAPS capsule Take 2 capsules (0.8 mg total) by mouth daily after supper. 06/01/20   Pokhrel, Rebekah Chesterfield, MD  timolol (TIMOPTIC) 0.5 % ophthalmic solution Place 1 drop into the right eye in the morning and at bedtime.  07/14/19   [provider]  zolpidem (AMBIEN) 5 MG tablet Take 1 tablet (5 mg total) by mouth at bedtime as needed for sleep. 06/13/20   Narda Bonds, MD      Allergies    Penicillins    Review of Systems   Review of Systems A 10 point review of systems was performed and is negative unless otherwise reported in HPI.  Physical Exam Updated Vital Signs BP (!) 163/94 (BP Location: Left Arm)   Pulse 85   Temp 98 F (36.7 C) (Oral)   Resp 16   Ht 6\' 1"  (1.854 m)   Wt 88.5 kg   SpO2 100%   BMI 25.73 kg/m  Physical Exam General: Normal appearing male, lying in bed.  HEENT: Sclera anicteric, MMM, trachea midline.  Cardiology: RRR, no murmurs/rubs/gallops.  Resp: Normal respiratory rate and effort. CTAB, no wheezes, rhonchi, crackles.  Abd: Soft, non-tender, non-distended. No rebound tenderness or guarding.  MSK: No peripheral edema or signs of trauma.  Skin: warm, dry. Back: No CVA tenderness Neuro: A&Ox4, CNs II-XII grossly intact. MAEs. Sensation grossly intact.  Psych: Normal mood and affect.   ED Results / Procedures / Treatments   Labs (all labs ordered are listed, but only abnormal  results are displayed) Labs Reviewed  COMPREHENSIVE METABOLIC PANEL - Abnormal; Notable for the following components:      Result Value   Potassium 3.1 (*)    Creatinine, Ser 2.83 (*)    GFR, Estimated 23 (*)    All other components within normal limits  CBC WITH DIFFERENTIAL/PLATELET  RETICULOCYTES  PROTIME-INR  APTT  VITAMIN B12  FOLATE  IRON AND TIBC  FERRITIN    EKG None  Radiology No results found.  Procedures Procedures    Medications Ordered in ED Medications - No data to display  ED Course/ Medical Decision Making/ A&P                          Medical Decision Making Amount and/or  Complexity of Data Reviewed Labs: ordered.    MDM:   Patient had reported a hemoglobin drop from 12-->6.0 in the absence of any bleeding.  He does have renal insufficiency which could have been the cause.  However retest of his hemoglobin today demonstrates 13.0.  The 6.0 is likely an erroneous lab value.  Patient is very well-appearing, completed dialysis today, otherwise feels in his normal state of health.  He has no medical emergency present and he is stable for discharge.  Recommend follow-up with his primary care physician and GI as originally scheduled or as needed.  Given discharge instructions and return precautions, all questions answered to patient satisfaction.     Labs: I Ordered, and personally interpreted labs.  The pertinent results include: Those listed above   Additional history obtained from chart review.  External records from outside source obtained and reviewed including nephrology   Social Determinants of Health:  lives independently  Disposition:  DC  Co morbidities that complicate the patient evaluation  Past Medical History:  Diagnosis Date   AKI (acute kidney injury) (HCC) 05/2020   possibly due to prostate blockage   Anxiety    Arthritis    Depression    Dyspnea    Glaucoma    Hypertension    Prostate cancer (HCC)      Medicines No  orders of the defined types were placed in this encounter.   I have reviewed the patients home medicines and have made adjustments as needed  Problem List / ED Course: Problem List Items Addressed This Visit   None Visit Diagnoses     Abnormal laboratory test result    -  Primary                   This note was created using dictation software, which may contain spelling or grammatical errors.    Loetta Rough, MD 08/20/23 [phone]

## 2023-08-20 NOTE — ED Triage Notes (Signed)
Pt presents with c/o abnormal lab. Pt reports that he was told by his MD that his hemoglobin was 6.0. Pt is a dialysis pt, just finished his dialysis treatment about 30 mins ago.

## 2023-08-20 NOTE — Discharge Instructions (Signed)
Thank you for coming to [facility] Emergency Department. You were seen for concern for abnormal hemoglobin value. We retested it today and it was hemoglobin 13.0, which is normal. The other test was likely an erroneous value.  Please follow up with your primary care provider as needed.   Do not hesitate to return to the ED or call 911 if you experience: -Worsening symptoms -Lightheadedness, passing out -Fevers/chills -Anything else that concerns you

## 2023-09-18 DIAGNOSIS — K219 Gastro-esophageal reflux disease without esophagitis: Secondary | ICD-10-CM | POA: Insufficient documentation

## 2023-09-19 ENCOUNTER — Encounter (HOSPITAL_COMMUNITY): Payer: Self-pay

## 2023-09-19 ENCOUNTER — Emergency Department (HOSPITAL_COMMUNITY)
Admission: EM | Admit: 2023-09-19 | Discharge: 2023-09-19 | Disposition: A | Discharge status: Home / Self Care | Payer: Medicare Other | Attending: Emergency Medicine | Admitting: Emergency Medicine

## 2023-09-19 ENCOUNTER — Emergency Department (HOSPITAL_COMMUNITY): Payer: Medicare Other

## 2023-09-19 ENCOUNTER — Other Ambulatory Visit: Payer: Self-pay

## 2023-09-19 DIAGNOSIS — Z992 Dependence on renal dialysis: Secondary | ICD-10-CM | POA: Insufficient documentation

## 2023-09-19 DIAGNOSIS — N186 End stage renal disease: Secondary | ICD-10-CM | POA: Diagnosis present

## 2023-09-19 DIAGNOSIS — Z8546 Personal history of malignant neoplasm of prostate: Secondary | ICD-10-CM | POA: Insufficient documentation

## 2023-09-19 DIAGNOSIS — R42 Dizziness and giddiness: Secondary | ICD-10-CM | POA: Diagnosis not present

## 2023-09-19 DIAGNOSIS — Z79899 Other long term (current) drug therapy: Secondary | ICD-10-CM | POA: Diagnosis not present

## 2023-09-19 DIAGNOSIS — I120 Hypertensive chronic kidney disease with stage 5 chronic kidney disease or end stage renal disease: Secondary | ICD-10-CM | POA: Insufficient documentation

## 2023-09-19 LAB — COMPREHENSIVE METABOLIC PANEL
ALT: 14 U/L (ref 0–44)
AST: 16 U/L (ref 15–41)
Albumin: 3.9 g/dL (ref 3.5–5.0)
Alkaline Phosphatase: 72 U/L (ref 38–126)
Anion gap: 11 (ref 5–15)
BUN: 36 mg/dL — ABNORMAL HIGH (ref 8–23)
CO2: 21 mmol/L — ABNORMAL LOW (ref 22–32)
Calcium: 9.3 mg/dL (ref 8.9–10.3)
Chloride: 102 mmol/L (ref 98–111)
Creatinine, Ser: 7.7 mg/dL — ABNORMAL HIGH (ref 0.61–1.24)
GFR, Estimated: 7 mL/min — ABNORMAL LOW (ref >60)
Glucose, Bld: 89 mg/dL (ref 70–99)
Potassium: 4.4 mmol/L (ref 3.5–5.1)
Sodium: 134 mmol/L — ABNORMAL LOW (ref 135–145)
Total Bilirubin: 0.5 mg/dL (ref <1.2)
Total Protein: 7.2 g/dL (ref 6.5–8.1)

## 2023-09-19 LAB — CBC
HCT: 33.4 % — ABNORMAL LOW (ref 39.0–52.0)
Hemoglobin: 10.9 g/dL — ABNORMAL LOW (ref 13.0–17.0)
MCH: 29.1 pg (ref 26.0–34.0)
MCHC: 32.6 g/dL (ref 30.0–36.0)
MCV: 89.1 fL (ref 80.0–100.0)
Platelets: 224 10*3/uL (ref 150–400)
RBC: 3.75 MIL/uL — ABNORMAL LOW (ref 4.22–5.81)
RDW: 16 % — ABNORMAL HIGH (ref 11.5–15.5)
WBC: 12.7 10*3/uL — ABNORMAL HIGH (ref 4.0–10.5)
nRBC: 0 % (ref 0.0–0.2)

## 2023-09-19 NOTE — ED Triage Notes (Addendum)
Patient reports he missed 2 dialysis appointments. Reports he feels dizzy and has swollen lower extremities.   Patient is A&Ox4, VSS, NAD

## 2023-09-19 NOTE — ED Provider Notes (Signed)
[place] EMERGENCY DEPARTMENT AT [facility] Provider Note   CSN: [id number] Arrival date & time: 09/19/23  1524     History  Chief Complaint  Patient presents with   Dizziness    Manuel Ortega is a 72 y.o. male.   Dizziness Patient presents with dizziness and feeling bad.  Has been missing dialysis.  Over the last 2 weeks it appears that he has gone to 2 of his 6 appointments.  States it was due to the holidays.  States he went in today to be dialyzed and there was only 1 tech for 9 bed so he left after they could not reschedule for the afternoon. States he felt more dizzy last night.  Today is Saturday and he was last dialyzed on Tuesday this week and only had 1 dialysis the previous week.  States he does make a fair amount of urine.  States he was able to sleep last night after acute episode of feeling dizzy.  Patient states he feels a little off as if he has done mushrooms.    Past Medical History:  Diagnosis Date   AKI (acute kidney injury) (HCC) 05/2020   possibly due to prostate blockage   Anxiety    Arthritis    Depression    Dyspnea    Glaucoma    Hypertension    Prostate cancer (HCC)     Home Medications Prior to Admission medications   Medication Sig Start Date End Date Taking? Authorizing Provider  amLODipine (NORVASC) 10 MG tablet Take by mouth. Patient not taking: Reported on 05/14/2023 03/18/22   [provider]  atorvastatin (LIPITOR) 10 MG tablet Take 10 mg by mouth daily.    [provider]  azelastine (ASTELIN) 0.1 % nasal spray one spray by Both Nostrils route 2 (two) times daily. Use in each nostril as directed 09/17/22 09/17/23  [provider]  benzonatate (TESSALON) 100 MG capsule Take 100 mg by mouth 3 (three) times daily as needed. 09/17/22   [provider]  calcium carbonate (TUMS) 500 MG chewable tablet Chew by mouth. 03/18/22   [provider]  ciprofloxacin (CIPRO) 250 MG tablet Take 250 mg  by mouth daily. 10/17/22   [provider]  clotrimazole-betamethasone (LOTRISONE) cream APPLY 1 APPLICATION TOPICALLY DAILY 04/13/23   Edwin Cap, DPM  diphenhydrAMINE (BENADRYL) 25 mg capsule Take 50 mg by mouth every 4 (four) hours as needed for itching or allergies.     [provider]  divalproex (DEPAKOTE SPRINKLE) 125 MG capsule Take by mouth. 06/12/20   [provider]  docusate calcium (SURFAK) 240 MG capsule Take by mouth. 06/12/20   [provider]  docusate sodium (COLACE) 100 MG capsule Take 1 capsule (100 mg total) by mouth 2 (two) times daily. 06/01/20   Pokhrel, Rebekah Chesterfield, MD  ferric citrate (AURYXIA) 1 GM 210 MG(Fe) tablet Take 420 mg by mouth 3 (three) times daily with meals.     [provider]  FLUoxetine (PROZAC) 40 MG capsule Take 1 capsule (40 mg total) by mouth daily. Patient taking differently: Take 50 mg by mouth 2 (two) times daily. 06/02/20   Pokhrel, Rebekah Chesterfield, MD  fluticasone (FLONASE) 50 MCG/ACT nasal spray Place 1 spray into both nostrils daily as needed for allergies or rhinitis.    [provider]  ketoconazole (NIZORAL) 2 % cream Apply topically daily. 11/17/22 11/17/23  [provider]  latanoprost (XALATAN) 0.005 % ophthalmic solution Place 1 drop into the right eye  at bedtime.  12/19/19   [provider]  LOPERAMIDE HCL PO Take by mouth. 10/25/22 10/24/23  [provider]  magnesium oxide (MAG-OX) 400 (241.3 Mg) MG tablet Take 1 tablet (400 mg total) by mouth daily. 06/02/20   Pokhrel, Rebekah Chesterfield, MD  Magnesium Oxide 400 MG CAPS Take by mouth. 06/12/20   [provider]  melatonin 5 MG TABS Take 1 tablet (5 mg total) by mouth at bedtime. Patient taking differently: Take 5 mg by mouth at bedtime as needed (sleep). 06/01/20   Pokhrel, Rebekah Chesterfield, MD  Nutritional Supplements (FEEDING SUPPLEMENT, NEPRO CARB STEADY,) LIQD Take 237 mLs by mouth 2 (two) times daily between meals. 06/01/20   Pokhrel, Rebekah Chesterfield,  MD  Psyllium 0.36 g CAPS Take by mouth. 06/12/20   [provider]  QUEtiapine (SEROQUEL) 200 MG tablet Take 200 mg by mouth at bedtime.  11/14/19   [provider]  SENSIPAR 30 MG tablet Take by mouth. 09/01/22   [provider]  sertraline (ZOLOFT) 100 MG tablet Take 100 mg by mouth 2 (two) times daily. 150mg  daily    [provider]  sildenafil (VIAGRA) 100 MG tablet SMARTSIG:0.5 Tablet(s) By Mouth As Directed 10/29/22   [provider]  tamsulosin (FLOMAX) 0.4 MG CAPS capsule Take 2 capsules (0.8 mg total) by mouth daily after supper. 06/01/20   Pokhrel, Rebekah Chesterfield, MD  timolol (TIMOPTIC) 0.5 % ophthalmic solution Place 1 drop into the right eye in the morning and at bedtime.  07/14/19   [provider]  zolpidem (AMBIEN) 5 MG tablet Take 1 tablet (5 mg total) by mouth at bedtime as needed for sleep. 06/13/20   Narda Bonds, MD      Allergies    Penicillins    Review of Systems   Review of Systems  Neurological:  Positive for dizziness.    Physical Exam Updated Vital Signs BP 135/73   Pulse 97   Temp 98 F (36.7 C) (Oral)   Resp 18   Ht 6\' 1"  (1.854 m)   Wt 89.8 kg   SpO2 99%   BMI 26.12 kg/m  Physical Exam Vitals and nursing note reviewed.  HENT:     Head: Atraumatic.  Cardiovascular:     Rate and Rhythm: Regular rhythm.  Chest:     Chest wall: No tenderness.  Abdominal:     Tenderness: There is no abdominal tenderness.  Musculoskeletal:        General: No tenderness.     Cervical back: Neck supple.  Skin:    General: Skin is warm.  Neurological:     Mental Status: He is alert and oriented to person, place, and time.     ED Results / Procedures / Treatments   Labs (all labs ordered are listed, but only abnormal results are displayed) Labs Reviewed  COMPREHENSIVE METABOLIC PANEL - Abnormal; Notable for the following components:      Result Value   Sodium 134 (*)    CO2 21 (*)    BUN 36 (*)    Creatinine, Ser  7.70 (*)    GFR, Estimated 7 (*)    All other components within normal limits  CBC - Abnormal; Notable for the following components:   WBC 12.7 (*)    RBC 3.75 (*)    Hemoglobin 10.9 (*)    HCT 33.4 (*)    RDW 16.0 (*)    All other components within normal limits    EKG EKG Interpretation Date/Time:  Saturday  September 19 2023 15:48:46 EST Ventricular Rate:  100 PR Interval:  202 QRS Duration:  99 QT Interval:  355 QTC Calculation: 458 R Axis:   -28  Text Interpretation: Sinus tachycardia Borderline left axis deviation Abnormal R-wave progression, early transition Confirmed by Benjiman Core [phone]) on 09/19/2023 4:00:58 PM  Radiology DG Chest Portable 1 View  Result Date: 09/19/2023 CLINICAL DATA:  Weakness with dizziness and lower extremity swelling. Patient reports missing to dialysis appointments. EXAM: PORTABLE CHEST 1 VIEW COMPARISON:  Radiographs 06/10/2020. FINDINGS: 1556 hours. Right IJ hemodialysis catheter projects to the level of the upper right atrium. The heart size and mediastinal contours are stable. The lungs remain clear. There is no edema, pleural effusion or pneumothorax. The bones appear unremarkable. Telemetry leads overlie the chest. IMPRESSION: No evidence of acute cardiopulmonary process. Right IJ hemodialysis catheter in place. Electronically Signed   By: Carey Bullocks M.D.   On: 09/19/2023 16:15    Procedures Procedures    Medications Ordered in ED Medications - No data to display  ED Course/ Medical Decision Making/ A&P                                 Medical Decision Making Amount and/or Complexity of Data Reviewed Labs: ordered. Radiology: ordered.  Patient with feeling bad after missing dialysis.  Has some edema on his legs.  Not hypoxic.  Is somewhat hypertensive.  Differential diagnosis does include electrolyte abnormalities and volume overload.  EKG reassuring.  Will get chest x-ray and blood work and discussed with  nephrology.  Blood work reassuring.  Chest x-ray reassuring.  Blood pressure improved.  Does not appear to need emergent dialysis.  Discussed with Dr. Allena Katz.  Cannot get dialysis tonight.  Can go to [facility] tomorrow if he is still feeling bad.  Otherwise potential dialysis on Monday or Tuesday.  Discussed with patient who is comfortable with this plan will discharge home.         Final Clinical Impression(s) / ED Diagnoses Final diagnoses:  End stage renal disease on dialysis [facility])    Rx / DC Orders ED Discharge Orders     None         Benjiman Core, MD 09/19/23 2316

## 2023-09-23 ENCOUNTER — Other Ambulatory Visit: Payer: Self-pay

## 2023-09-23 ENCOUNTER — Ambulatory Visit (HOSPITAL_COMMUNITY)
Admission: AD | Admit: 2023-09-23 | Discharge: 2023-09-23 | Disposition: A | Discharge status: Home / Self Care | Payer: Medicare Other | Source: Ambulatory Visit | Attending: Nephrology | Admitting: Nephrology

## 2023-09-23 ENCOUNTER — Encounter (HOSPITAL_COMMUNITY)
Admission: AD | Disposition: A | Discharge status: Home / Self Care | Payer: Self-pay | Source: Ambulatory Visit | Attending: Nephrology

## 2023-09-23 DIAGNOSIS — T8249XA Other complication of vascular dialysis catheter, initial encounter: Secondary | ICD-10-CM | POA: Diagnosis present

## 2023-09-23 DIAGNOSIS — I120 Hypertensive chronic kidney disease with stage 5 chronic kidney disease or end stage renal disease: Secondary | ICD-10-CM | POA: Insufficient documentation

## 2023-09-23 DIAGNOSIS — Z8546 Personal history of malignant neoplasm of prostate: Secondary | ICD-10-CM | POA: Insufficient documentation

## 2023-09-23 DIAGNOSIS — N186 End stage renal disease: Secondary | ICD-10-CM | POA: Diagnosis not present

## 2023-09-23 DIAGNOSIS — Z992 Dependence on renal dialysis: Secondary | ICD-10-CM | POA: Diagnosis not present

## 2023-09-23 DIAGNOSIS — N250 Renal osteodystrophy: Secondary | ICD-10-CM | POA: Diagnosis not present

## 2023-09-23 DIAGNOSIS — I953 Hypotension of hemodialysis: Secondary | ICD-10-CM | POA: Insufficient documentation

## 2023-09-23 HISTORY — PX: DIALYSIS/PERMA CATHETER INSERTION: CATH118288

## 2023-09-23 HISTORY — PX: DIALYSIS/PERMA CATHETER REMOVAL: CATH118289

## 2023-09-23 LAB — POCT I-STAT, CHEM 8
BUN: 60 mg/dL — ABNORMAL HIGH (ref 8–23)
Calcium, Ion: 1.21 mmol/L (ref 1.15–1.40)
Chloride: 101 mmol/L (ref 98–111)
Creatinine, Ser: 9.5 mg/dL — ABNORMAL HIGH (ref 0.61–1.24)
Glucose, Bld: 105 mg/dL — ABNORMAL HIGH (ref 70–99)
HCT: 31 % — ABNORMAL LOW (ref 39.0–52.0)
Hemoglobin: 10.5 g/dL — ABNORMAL LOW (ref 13.0–17.0)
Potassium: 4.3 mmol/L (ref 3.5–5.1)
Sodium: 136 mmol/L (ref 135–145)
TCO2: 20 mmol/L — ABNORMAL LOW (ref 22–32)

## 2023-09-23 SURGERY — DIALYSIS/PERMA CATHETER REMOVAL

## 2023-09-23 MED ORDER — HEPARIN (PORCINE) IN NACL 1000-0.9 UT/500ML-% IV SOLN
INTRAVENOUS | Status: DC | PRN
  Administered 2023-09-23: 500 mL

## 2023-09-23 MED ORDER — FENTANYL CITRATE (PF) 100 MCG/2ML IJ SOLN
INTRAMUSCULAR | Status: AC
  Filled 2023-09-23: qty 2

## 2023-09-23 MED ORDER — ACETAMINOPHEN 325 MG PO TABS: 650.0000 mg | ORAL_TABLET | ORAL | Status: DC | PRN

## 2023-09-23 MED ORDER — MIDAZOLAM HCL 2 MG/2ML IJ SOLN
INTRAMUSCULAR | Status: DC | PRN
  Administered 2023-09-23: 1 mg via INTRAVENOUS

## 2023-09-23 MED ORDER — FENTANYL CITRATE (PF) 100 MCG/2ML IJ SOLN
INTRAMUSCULAR | Status: DC | PRN
  Administered 2023-09-23: 50 ug via INTRAVENOUS

## 2023-09-23 MED ORDER — SODIUM CHLORIDE 0.9% FLUSH: 3.0000 mL | INTRAVENOUS | Status: DC | PRN

## 2023-09-23 MED ORDER — MIDAZOLAM HCL 2 MG/2ML IJ SOLN
INTRAMUSCULAR | Status: AC
  Filled 2023-09-23: qty 2

## 2023-09-23 MED ORDER — LIDOCAINE HCL (PF) 1 % IJ SOLN
INTRAMUSCULAR | Status: AC
  Filled 2023-09-23: qty 30

## 2023-09-23 MED ORDER — LIDOCAINE HCL (PF) 1 % IJ SOLN
INTRAMUSCULAR | Status: DC | PRN
  Administered 2023-09-23: 5 mL

## 2023-09-23 MED ORDER — SODIUM CHLORIDE 0.9% FLUSH: 10.0000 mL | Freq: Two times a day (BID) | INTRAVENOUS | Status: DC

## 2023-09-23 MED ORDER — IODIXANOL 320 MG/ML IV SOLN
INTRAVENOUS | Status: DC | PRN
  Administered 2023-09-23: 5 mL

## 2023-09-23 MED ORDER — HEPARIN SODIUM (PORCINE) 1000 UNIT/ML IJ SOLN
INTRAMUSCULAR | Status: DC | PRN
  Administered 2023-09-23 (×2): 1.6 [IU] via INTRA_ARTERIAL

## 2023-09-23 MED ORDER — HEPARIN SODIUM (PORCINE) 1000 UNIT/ML IJ SOLN
INTRAMUSCULAR | Status: AC
  Filled 2023-09-23: qty 10

## 2023-09-23 SURGICAL SUPPLY — 8 items
BAG SNAP BAND KOVER 36X36 (MISCELLANEOUS) IMPLANT
BALLN MUSTANG 9X40X75 (BALLOONS) ×1
BALLOON MUSTANG 9X40X75 (BALLOONS) IMPLANT
CATH PALINDROME 19 SP (CATHETERS) IMPLANT
COVER DOME SNAP 22 D (MISCELLANEOUS) IMPLANT
GUIDEWIRE ANGLED .035X150CM (WIRE) IMPLANT
SYR MEDALLION 10ML (SYRINGE) IMPLANT
TRAY PV CATH (CUSTOM PROCEDURE TRAY) ×1 IMPLANT

## 2023-09-23 NOTE — H&P (Addendum)
Chief Complaint: Decreased flows  Interval H&P   The patient has presented today for tunneled catheter exchange.  Various methods of treatment have been discussed with the patient.  After consideration of risk, benefits and other options for treatment, the patient has consented to a tunneled catheter insertion.   Risks  of bleeding, pain, infection, nonhealing wound, lung and carotid artery injury were explained to the patient.  The patient's history has been reviewed and the patient has been examined, no changes in status.  Stable for tunneled catheter insertion.  I have reviewed the patient's chart and labs.  Questions were answered to the patient's satisfaction.  Assessment/Plan: ESRD dialyzing at [facility]  TTS regimen with last dialysis 09/15/2023 leaving above his dry weight of 90.3 kg.  Of note his prior dialysis treatment on 11/30 he did leave at 89 kg.  Will need to check a potassium level given his last dialysis treatment was a week ago. Poor flows a right internal jugular 23 cm cuff to tip palindrome catheter last exchanged October 29, 2022. Planning on catheter exchange with possible angioplasty of restriction centrally.   Renal osteodystrophy - continue binders per home regimen. Anemia - managed with ESA's and IV iron at dialysis center. Hypotension after dialysis leading to ER visits on multiple occasions.   H/o a cancer   HPI: Elden An is an 72 y.o. male  HTN, h/o prostate cancer s/p TURP with Dr. Laverle Patter in December 2021 with Gleason score 3+4 adenocarcinoma with a PSA at that time of 1.77, anemia, ESRD dialyzed Tuesday Thursdays and Saturdays at [facility] with Dr. Ronalee Belts with last full treatment on 09/15/2023.  Pt denies fever, chills, nausea, vomiting, myalgias, SOB, CP.   ROS Per HPI.  Chemistry and CBC: Creatinine, Ser  Date/Time Value Ref Range Status  09/19/2023 04:29 PM 7.70 (H) 0.61 - 1.24 mg/dL Final  91/47/8295 62:13 AM 2.83 (H) 0.61 - 1.24  mg/dL Final  08/65/7846 96:29 AM 2.89 (H) 0.61 - 1.24 mg/dL Final  52/84/1324 40:10 AM 5.93 (H) 0.61 - 1.24 mg/dL Final  27/25/3664 40:34 PM 4.20 (H) 0.61 - 1.24 mg/dL Final  74/25/9563 87:56 AM 7.88 (H) 0.61 - 1.24 mg/dL Final  43/32/9518 84:16 PM 7.06 (H) 0.61 - 1.24 mg/dL Final  60/63/0160 10:93 AM 7.05 (H) 0.61 - 1.24 mg/dL Final  23/55/7322 02:54 AM 8.22 (H) 0.61 - 1.24 mg/dL Final  27/03/2375 28:31 AM 7.64 (H) 0.61 - 1.24 mg/dL Final  51/76/1607 37:10 AM 6.20 (H) 0.61 - 1.24 mg/dL Final  62/69/4854 62:70 AM 3.72 (H) 0.61 - 1.24 mg/dL Final    Comment:    DELTA CHECK NOTED  06/01/2020 05:40 AM 3.79 (H) 0.61 - 1.24 mg/dL Final    Comment:    DELTA CHECK NOTED  05/31/2020 04:30 AM 7.35 (H) 0.61 - 1.24 mg/dL Final  35/00/9381 82:99 AM 6.52 (H) 0.61 - 1.24 mg/dL Final  37/16/9678 93:81 AM 5.44 (H) 0.61 - 1.24 mg/dL Final    Comment:    DELTA CHECK NOTED  05/28/2020 04:18 AM 9.02 (H) 0.61 - 1.24 mg/dL Final  01/75/1025 85:27 AM 8.48 (H) 0.61 - 1.24 mg/dL Final  78/24/2353 61:44 AM 8.40 (H) 0.61 - 1.24 mg/dL Final  31/54/0086 76:19 AM 8.03 (H) 0.61 - 1.24 mg/dL Final  50/93/2671 24:58 AM 7.62 (H) 0.61 - 1.24 mg/dL Final  09/98/3382 50:53 AM 6.85 (H) 0.61 - 1.24 mg/dL Final  97/67/3419 37:90 AM 5.99 (H) 0.61 - 1.24 mg/dL Final  24/06/7352 29:92 AM 8.29 (H)  0.61 - 1.24 mg/dL Final  45/40/9811 91:47 AM 7.60 (H) 0.61 - 1.24 mg/dL Final  82/95/6213 08:65 AM 7.47 (H) 0.61 - 1.24 mg/dL Final  78/46/9629 52:84 AM 7.02 (H) 0.61 - 1.24 mg/dL Final  13/24/4010 27:25 AM 6.67 (H) 0.61 - 1.24 mg/dL Final  36/64/4034 74:25 AM 6.28 (H) 0.61 - 1.24 mg/dL Final    Comment:    DIALYSIS  05/15/2020 06:40 AM 10.34 (H) 0.61 - 1.24 mg/dL Final  95/63/8756 43:32 AM 10.84 (H) 0.61 - 1.24 mg/dL Final  95/18/8416 60:63 PM 10.85 (H) 0.61 - 1.24 mg/dL Final  01/60/1093 23:55 AM 15.60 (H) 0.61 - 1.24 mg/dL Final  73/22/0254 27:06 AM 16.69 (H) 0.61 - 1.24 mg/dL Final  23/76/2831 51:76 AM 17.02 (H) 0.61 -  1.24 mg/dL Final  16/04/3709 62:69 AM 17.60 (H) 0.61 - 1.24 mg/dL Final  48/54/6270 35:00 PM 18.17 (H) 0.61 - 1.24 mg/dL Final  93/81/8299 37:16 PM 18.64 (H) 0.61 - 1.24 mg/dL Final   Recent Labs  Lab 09/19/23 1629  NA 134*  K 4.4  CL 102  CO2 21*  GLUCOSE 89  BUN 36*  CREATININE 7.70*  CALCIUM 9.3   Recent Labs  Lab 09/19/23 1629  WBC 12.7*  HGB 10.9*  HCT 33.4*  MCV 89.1  PLT 224   Liver Function Tests: Recent Labs  Lab 09/19/23 1629  AST 16  ALT 14  ALKPHOS 72  BILITOT 0.5  PROT 7.2  ALBUMIN 3.9   No results for input(s): "LIPASE", "AMYLASE" in the last 168 hours. No results for input(s): "AMMONIA" in the last 168 hours. Cardiac Enzymes: No results for input(s): "CKTOTAL", "CKMB", "CKMBINDEX", "TROPONINI" in the last 168 hours. Iron Studies: No results for input(s): "IRON", "TIBC", "TRANSFERRIN", "FERRITIN" in the last 72 hours. PT/INR: @LABRCNTIP (inr:5)  Xrays/Other Studies: )No results found for this or any previous visit (from the past 48 hour(s)). No results found.  PMH:   Past Medical History:  Diagnosis Date   AKI (acute kidney injury) (HCC) 05/2020   possibly due to prostate blockage   Anxiety    Arthritis    Depression    Dyspnea    Glaucoma    Hypertension    Prostate cancer (HCC)     PSH:   Past Surgical History:  Procedure Laterality Date   growth in nostril     biopsied negatiove   IR FLUORO GUIDE CV LINE RIGHT  05/12/2020   IR FLUORO GUIDE CV LINE RIGHT  05/28/2020   IR US GUIDE VASC ACCESS RIGHT  05/12/2020   IR US GUIDE VASC ACCESS RIGHT  05/28/2020   TRANSURETHRAL RESECTION OF PROSTATE N/A 09/13/2020   Procedure: TRANSURETHRAL RESECTION OF THE PROSTATE (TURP) WITH CYSTOSCOPY;  Surgeon: Heloise Purpura, MD;  Location: [facility];  Service: Urology;  Laterality: N/A;    Allergies:  Allergies  Allergen Reactions   Penicillins Other (See Comments)    unknown    Medications:   Prior to Admission medications   Medication Sig  Start Date End Date Taking? Authorizing Provider  amLODipine (NORVASC) 10 MG tablet Take by mouth. Patient not taking: Reported on 05/14/2023 03/18/22   [provider]  atorvastatin (LIPITOR) 10 MG tablet Take 10 mg by mouth daily.    [provider]  azelastine (ASTELIN) 0.1 % nasal spray one spray by Both Nostrils route 2 (two) times daily. Use in each nostril as directed 09/17/22 09/17/23  [provider]  benzonatate (TESSALON) 100 MG capsule Take 100 mg by mouth  3 (three) times daily as needed. 09/17/22   [provider]  calcium carbonate (TUMS) 500 MG chewable tablet Chew by mouth. 03/18/22   [provider]  ciprofloxacin (CIPRO) 250 MG tablet Take 250 mg by mouth daily. 10/17/22   [provider]  clotrimazole-betamethasone (LOTRISONE) cream APPLY 1 APPLICATION TOPICALLY DAILY 04/13/23   Edwin Cap, DPM  diphenhydrAMINE (BENADRYL) 25 mg capsule Take 50 mg by mouth every 4 (four) hours as needed for itching or allergies.     [provider]  divalproex (DEPAKOTE SPRINKLE) 125 MG capsule Take by mouth. 06/12/20   [provider]  docusate calcium (SURFAK) 240 MG capsule Take by mouth. 06/12/20   [provider]  docusate sodium (COLACE) 100 MG capsule Take 1 capsule (100 mg total) by mouth 2 (two) times daily. 06/01/20   Pokhrel, Rebekah Chesterfield, MD  ferric citrate (AURYXIA) 1 GM 210 MG(Fe) tablet Take 420 mg by mouth 3 (three) times daily with meals.     [provider]  FLUoxetine (PROZAC) 40 MG capsule Take 1 capsule (40 mg total) by mouth daily. Patient taking differently: Take 50 mg by mouth 2 (two) times daily. 06/02/20   Pokhrel, Rebekah Chesterfield, MD  fluticasone (FLONASE) 50 MCG/ACT nasal spray Place 1 spray into both nostrils daily as needed for allergies or rhinitis.    [provider]  ketoconazole (NIZORAL) 2 % cream Apply topically daily. 11/17/22 11/17/23  [provider]  latanoprost (XALATAN) 0.005 %  ophthalmic solution Place 1 drop into the right eye at bedtime.  12/19/19   [provider]  LOPERAMIDE HCL PO Take by mouth. 10/25/22 10/24/23  [provider]  magnesium oxide (MAG-OX) 400 (241.3 Mg) MG tablet Take 1 tablet (400 mg total) by mouth daily. 06/02/20   Pokhrel, Rebekah Chesterfield, MD  Magnesium Oxide 400 MG CAPS Take by mouth. 06/12/20   [provider]  melatonin 5 MG TABS Take 1 tablet (5 mg total) by mouth at bedtime. Patient taking differently: Take 5 mg by mouth at bedtime as needed (sleep). 06/01/20   Pokhrel, Rebekah Chesterfield, MD  Nutritional Supplements (FEEDING SUPPLEMENT, NEPRO CARB STEADY,) LIQD Take 237 mLs by mouth 2 (two) times daily between meals. 06/01/20   Pokhrel, Rebekah Chesterfield, MD  Psyllium 0.36 g CAPS Take by mouth. 06/12/20   [provider]  QUEtiapine (SEROQUEL) 200 MG tablet Take 200 mg by mouth at bedtime.  11/14/19   [provider]  SENSIPAR 30 MG tablet Take by mouth. 09/01/22   [provider]  sertraline (ZOLOFT) 100 MG tablet Take 100 mg by mouth 2 (two) times daily. 150mg  daily    [provider]  sildenafil (VIAGRA) 100 MG tablet SMARTSIG:0.5 Tablet(s) By Mouth As Directed 10/29/22   [provider]  tamsulosin (FLOMAX) 0.4 MG CAPS capsule Take 2 capsules (0.8 mg total) by mouth daily after supper. 06/01/20   Pokhrel, Rebekah Chesterfield, MD  timolol (TIMOPTIC) 0.5 % ophthalmic solution Place 1 drop into the right eye in the morning and at bedtime.  07/14/19   [provider]  zolpidem (AMBIEN) 5 MG tablet Take 1 tablet (5 mg total) by mouth at bedtime as needed for sleep. 06/13/20   Narda Bonds, MD    Discontinued Meds:  There are no discontinued medications.  Social History:  reports that he has never smoked. He has never used smokeless tobacco. He reports current alcohol use of about 1.0 standard drink of alcohol per week. He reports that he does not use  drugs.  Family History:   Family History  Problem Relation Age  of Onset   Stroke Mother    Breast cancer Neg Hx    Colon cancer Neg Hx    Prostate cancer Neg Hx    Pancreatic cancer Neg Hx     There were no vitals taken for this visit. Gen alert No rash, cyanosis or gangrene Sclera anicteric, throat clear  No jvd or bruits Chest clear bilat RRR no MRG Abd soft ntnd no mass or ascites +bs Ext no leg or UE edema, no wounds or ulcers  Neuro is alert, Ox 3 , nf  R IJ TDC       Avangelina Flight, Len Blalock, MD 09/23/2023, 3:32 PM

## 2023-09-23 NOTE — Discharge Instructions (Signed)

## 2023-09-23 NOTE — Progress Notes (Signed)
Discharge papers given, No PIV noted, no questions asked, pt informed to call ride for pick-up at approximately 1820, wheelchair used to escort pt out of cath lab holding area, safety maintained

## 2023-09-23 NOTE — Op Note (Signed)
Patient presents with poor flows in his right IJ tunneled hemodialysis catheter (23 cm cuff to tip- Palindrome) that was used at [facility] in January 2024.  On examination, aspiration from both ports is sluggish. Chest x-ray confirms the catheter tip is appropriately positioned deep in the RA. The catheter cuff is 1-1/2cm deep to the exit site.    Summary:  1) The patient had a successful 23 cm -> 19 CTT Palindrome hemodialysis catheter exchange in the right internal jugular vein. 2) Restriction in the innominate vein treated with 9 mm balloon angioplasty to full effacement at approximately 8 atm of pressure. 3) Okay to use catheter immediately.  Description of procedure: The right neck, chest and the catheter were prepped and draped in the usual sterile fashion. The exit site and adjacent tunnel tract were anesthetized with lidocaine 1% with epinephrine. The cuff was dissected free with a curved Kelly and manual traction. The catheter was withdrawn and venogram through each of the ports was performed; there was a 70 to 80% restriction in the right innominate vein, patent SVC.    Of note even with the cuff 3 inches outside the exit site the tip of the catheter was still in the SVC.  Wire was advanced till the tip was in the IVC.  Then a 9 x 4 Mustang balloon was advanced over the wire to the level of the innominate vein restriction.  Venous angioplasty was performed to 8 atm of pressure with full effacement noted.  A new 19 cm cuff to tip Palindrome catheter was inserted over the guidewire and the tip parked in the right atrium and at that point the cuff was approximately 2 cm deep to the exit site.   Aspiration and flushing of both limbs of the catheter confirmed excellent flow. No kinks were visible on fluoroscopic imaging. Both limbs of the catheter were locked with citrate and sterile caps were placed.   The hub was secured on to the chest wall with 2-0 nylon wing sutures.  Sterile dressings were  placed, and the patient returned to recovery in stable condition.  Sedation: Please see the pre-op and intra-op nursing notes for total doses.  Monitoring: Because of the patient's comorbid conditions and sedation during the procedure, continuous EKG monitoring and O2 saturation monitoring was performed throughout the procedure by the RN. There were no abnormal arrhythmias encountered.  Complications: None.  Diagnoses:   T82.49XA Other complication of vascular dialysis catheter (Poor flows) N18.6 End stage renal disease  Z99.2 Dialysis dependence  Procedures Coding:  36581 Tunneled catheter exchange 37248 Central venous angioplasty of innominate vein restriction 77001  Fluoroscopy guidance for catheter exchange. Z6109 Contrast  Recommendations: Remove the suture in 3 weeks. 2.   Report any blood flow problems to [facility].  Discharge: The patient was discharged home in stable condition. The patient was given education regarding the care of the catheter and specific instructions in case of any problems.

## 2023-09-24 ENCOUNTER — Encounter (HOSPITAL_COMMUNITY): Payer: Self-pay | Admitting: Nephrology

## 2023-10-09 ENCOUNTER — Encounter (HOSPITAL_COMMUNITY): Payer: Self-pay

## 2023-10-09 ENCOUNTER — Emergency Department (HOSPITAL_COMMUNITY): Payer: Medicare Other

## 2023-10-09 ENCOUNTER — Other Ambulatory Visit: Payer: Self-pay

## 2023-10-09 ENCOUNTER — Inpatient Hospital Stay (HOSPITAL_COMMUNITY)
Admission: EM | Admit: 2023-10-09 | Discharge: 2023-10-16 | DRG: 811 | Disposition: A | Discharge status: Home / Self Care | Payer: Medicare Other | Attending: Internal Medicine | Admitting: Internal Medicine

## 2023-10-09 DIAGNOSIS — F419 Anxiety disorder, unspecified: Secondary | ICD-10-CM

## 2023-10-09 DIAGNOSIS — R911 Solitary pulmonary nodule: Secondary | ICD-10-CM

## 2023-10-09 DIAGNOSIS — E8770 Fluid overload, unspecified: Principal | ICD-10-CM | POA: Diagnosis present

## 2023-10-09 DIAGNOSIS — K863 Pseudocyst of pancreas: Secondary | ICD-10-CM | POA: Diagnosis present

## 2023-10-09 DIAGNOSIS — Z5986 Financial insecurity: Secondary | ICD-10-CM

## 2023-10-09 DIAGNOSIS — R6881 Early satiety: Secondary | ICD-10-CM | POA: Diagnosis present

## 2023-10-09 DIAGNOSIS — D62 Acute posthemorrhagic anemia: Secondary | ICD-10-CM | POA: Diagnosis not present

## 2023-10-09 DIAGNOSIS — K264 Chronic or unspecified duodenal ulcer with hemorrhage: Secondary | ICD-10-CM | POA: Diagnosis present

## 2023-10-09 DIAGNOSIS — M1990 Unspecified osteoarthritis, unspecified site: Secondary | ICD-10-CM | POA: Diagnosis present

## 2023-10-09 DIAGNOSIS — Z9079 Acquired absence of other genital organ(s): Secondary | ICD-10-CM

## 2023-10-09 DIAGNOSIS — N189 Chronic kidney disease, unspecified: Secondary | ICD-10-CM

## 2023-10-09 DIAGNOSIS — K449 Diaphragmatic hernia without obstruction or gangrene: Secondary | ICD-10-CM | POA: Diagnosis present

## 2023-10-09 DIAGNOSIS — N2581 Secondary hyperparathyroidism of renal origin: Secondary | ICD-10-CM | POA: Diagnosis present

## 2023-10-09 DIAGNOSIS — Z992 Dependence on renal dialysis: Secondary | ICD-10-CM

## 2023-10-09 DIAGNOSIS — K269 Duodenal ulcer, unspecified as acute or chronic, without hemorrhage or perforation: Secondary | ICD-10-CM

## 2023-10-09 DIAGNOSIS — N186 End stage renal disease: Secondary | ICD-10-CM | POA: Diagnosis not present

## 2023-10-09 DIAGNOSIS — I10 Essential (primary) hypertension: Secondary | ICD-10-CM | POA: Diagnosis not present

## 2023-10-09 DIAGNOSIS — D1803 Hemangioma of intra-abdominal structures: Secondary | ICD-10-CM | POA: Diagnosis present

## 2023-10-09 DIAGNOSIS — Z91158 Patient's noncompliance with renal dialysis for other reason: Secondary | ICD-10-CM

## 2023-10-09 DIAGNOSIS — E872 Acidosis, unspecified: Secondary | ICD-10-CM | POA: Diagnosis present

## 2023-10-09 DIAGNOSIS — K3189 Other diseases of stomach and duodenum: Secondary | ICD-10-CM

## 2023-10-09 DIAGNOSIS — Z638 Other specified problems related to primary support group: Secondary | ICD-10-CM

## 2023-10-09 DIAGNOSIS — K219 Gastro-esophageal reflux disease without esophagitis: Secondary | ICD-10-CM | POA: Diagnosis present

## 2023-10-09 DIAGNOSIS — C61 Malignant neoplasm of prostate: Secondary | ICD-10-CM | POA: Diagnosis present

## 2023-10-09 DIAGNOSIS — R339 Retention of urine, unspecified: Secondary | ICD-10-CM

## 2023-10-09 DIAGNOSIS — K862 Cyst of pancreas: Secondary | ICD-10-CM

## 2023-10-09 DIAGNOSIS — K317 Polyp of stomach and duodenum: Secondary | ICD-10-CM

## 2023-10-09 DIAGNOSIS — R55 Syncope and collapse: Principal | ICD-10-CM | POA: Diagnosis present

## 2023-10-09 DIAGNOSIS — H409 Unspecified glaucoma: Secondary | ICD-10-CM | POA: Diagnosis present

## 2023-10-09 DIAGNOSIS — R338 Other retention of urine: Secondary | ICD-10-CM | POA: Diagnosis present

## 2023-10-09 DIAGNOSIS — Z880 Allergy status to penicillin: Secondary | ICD-10-CM

## 2023-10-09 DIAGNOSIS — R0602 Shortness of breath: Secondary | ICD-10-CM | POA: Diagnosis present

## 2023-10-09 DIAGNOSIS — I120 Hypertensive chronic kidney disease with stage 5 chronic kidney disease or end stage renal disease: Secondary | ICD-10-CM | POA: Diagnosis present

## 2023-10-09 DIAGNOSIS — F32A Depression, unspecified: Secondary | ICD-10-CM | POA: Diagnosis present

## 2023-10-09 DIAGNOSIS — K529 Noninfective gastroenteritis and colitis, unspecified: Secondary | ICD-10-CM | POA: Diagnosis present

## 2023-10-09 DIAGNOSIS — K861 Other chronic pancreatitis: Secondary | ICD-10-CM | POA: Diagnosis present

## 2023-10-09 DIAGNOSIS — K921 Melena: Secondary | ICD-10-CM

## 2023-10-09 DIAGNOSIS — K2980 Duodenitis without bleeding: Secondary | ICD-10-CM

## 2023-10-09 DIAGNOSIS — Z8546 Personal history of malignant neoplasm of prostate: Secondary | ICD-10-CM

## 2023-10-09 DIAGNOSIS — R1084 Generalized abdominal pain: Secondary | ICD-10-CM

## 2023-10-09 DIAGNOSIS — R0609 Other forms of dyspnea: Secondary | ICD-10-CM

## 2023-10-09 DIAGNOSIS — Z79899 Other long term (current) drug therapy: Secondary | ICD-10-CM

## 2023-10-09 DIAGNOSIS — R000 Tachycardia, unspecified: Secondary | ICD-10-CM | POA: Diagnosis present

## 2023-10-09 DIAGNOSIS — D631 Anemia in chronic kidney disease: Secondary | ICD-10-CM | POA: Diagnosis present

## 2023-10-09 LAB — URINALYSIS, W/ REFLEX TO CULTURE (INFECTION SUSPECTED)
Bacteria, UA: NONE SEEN
Bilirubin Urine: NEGATIVE
Glucose, UA: NEGATIVE mg/dL
Hgb urine dipstick: NEGATIVE
Ketones, ur: NEGATIVE mg/dL
Nitrite: NEGATIVE
Protein, ur: 30 mg/dL — AB
Specific Gravity, Urine: 1.013 (ref 1.005–1.030)
pH: 7 (ref 5.0–8.0)

## 2023-10-09 LAB — COMPREHENSIVE METABOLIC PANEL
ALT: 14 U/L (ref 0–44)
AST: 15 U/L (ref 15–41)
Albumin: 3.4 g/dL — ABNORMAL LOW (ref 3.5–5.0)
Alkaline Phosphatase: 62 U/L (ref 38–126)
Anion gap: 15 (ref 5–15)
BUN: 99 mg/dL — ABNORMAL HIGH (ref 8–23)
CO2: 22 mmol/L (ref 22–32)
Calcium: 10 mg/dL (ref 8.9–10.3)
Chloride: 101 mmol/L (ref 98–111)
Creatinine, Ser: 7.64 mg/dL — ABNORMAL HIGH (ref 0.61–1.24)
GFR, Estimated: 7 mL/min — ABNORMAL LOW (ref >60)
Glucose, Bld: 97 mg/dL (ref 70–99)
Potassium: 4.4 mmol/L (ref 3.5–5.1)
Sodium: 138 mmol/L (ref 135–145)
Total Bilirubin: 0.4 mg/dL (ref <1.2)
Total Protein: 6.4 g/dL — ABNORMAL LOW (ref 6.5–8.1)

## 2023-10-09 LAB — CBC WITH DIFFERENTIAL/PLATELET
Abs Immature Granulocytes: 0.07 10*3/uL (ref 0.00–0.07)
Basophils Absolute: 0 10*3/uL (ref 0.0–0.1)
Basophils Relative: 0 %
Eosinophils Absolute: 0.2 10*3/uL (ref 0.0–0.5)
Eosinophils Relative: 2 %
HCT: 24.6 % — ABNORMAL LOW (ref 39.0–52.0)
Hemoglobin: 8.2 g/dL — ABNORMAL LOW (ref 13.0–17.0)
Immature Granulocytes: 1 %
Lymphocytes Relative: 13 %
Lymphs Abs: 1.3 10*3/uL (ref 0.7–4.0)
MCH: 30.6 pg (ref 26.0–34.0)
MCHC: 33.3 g/dL (ref 30.0–36.0)
MCV: 91.8 fL (ref 80.0–100.0)
Monocytes Absolute: 0.9 10*3/uL (ref 0.1–1.0)
Monocytes Relative: 9 %
Neutro Abs: 7.7 10*3/uL (ref 1.7–7.7)
Neutrophils Relative %: 75 %
Platelets: 277 10*3/uL (ref 150–400)
RBC: 2.68 MIL/uL — ABNORMAL LOW (ref 4.22–5.81)
RDW: 17.9 % — ABNORMAL HIGH (ref 11.5–15.5)
WBC: 10.2 10*3/uL (ref 4.0–10.5)
nRBC: 0 % (ref 0.0–0.2)

## 2023-10-09 LAB — I-STAT CG4 LACTIC ACID, ED: Lactic Acid, Venous: 2.7 mmol/L (ref 0.5–1.9)

## 2023-10-09 LAB — PROTIME-INR
INR: 0.9 (ref 0.8–1.2)
Prothrombin Time: 12.7 s (ref 11.4–15.2)

## 2023-10-09 LAB — CBG MONITORING, ED: Glucose-Capillary: 138 mg/dL — ABNORMAL HIGH (ref 70–99)

## 2023-10-09 MED ORDER — SERTRALINE HCL 100 MG PO TABS
200.0000 mg | ORAL_TABLET | Freq: Every day | ORAL | Status: DC
  Administered 2023-10-10 – 2023-10-15 (×6): 200 mg via ORAL
  Filled 2023-10-09 (×6): qty 2

## 2023-10-09 MED ORDER — LORAZEPAM 2 MG/ML IJ SOLN
0.5000 mg | Freq: Once | INTRAMUSCULAR | Status: AC
  Administered 2023-10-09: 0.5 mg via INTRAVENOUS
  Filled 2023-10-09: qty 1

## 2023-10-09 MED ORDER — IOHEXOL 350 MG/ML SOLN
100.0000 mL | Freq: Once | INTRAVENOUS | Status: AC | PRN
  Administered 2023-10-09: 100 mL via INTRAVENOUS

## 2023-10-09 MED ORDER — HEPARIN SODIUM (PORCINE) 5000 UNIT/ML IJ SOLN
5000.0000 [IU] | Freq: Three times a day (TID) | INTRAMUSCULAR | Status: DC
  Administered 2023-10-09 – 2023-10-10 (×2): 5000 [IU] via SUBCUTANEOUS
  Filled 2023-10-09 (×2): qty 1

## 2023-10-09 MED ORDER — LACTATED RINGERS IV BOLUS
500.0000 mL | Freq: Once | INTRAVENOUS | Status: AC
  Administered 2023-10-09: 500 mL via INTRAVENOUS

## 2023-10-09 NOTE — Assessment & Plan Note (Signed)
-  Noted incidentally on the CT imaging.  No active abdominal issue at this time other than nausea and vomiting likely secondary to uremia buildup secondary to missed dialysis session. -Pt has pancreatic insufficiency with chronic diarrhea but is improving with Creon

## 2023-10-09 NOTE — H&P (Signed)
History and Physical    Patient: Manuel Ortega ZOX:[id number] DOB: Feb 13, 1951 DOA: 10/09/2023 DOS: the patient was seen and examined on 10/09/2023 PCP: Bryon Lions, PA-C  Patient coming from: Home  Chief Complaint:  Chief Complaint  Patient presents with   Near Syncope   Abdominal Pain   Nausea   HPI: Manuel Ortega is a 72 y.o. male with medical history significant of ESRD TTS, prostate cancer s/p TURP, HTN, hyperparathyroidism, anemia of chronic disease, GERD, pancreatic insufficiency, BPH who presents with near syncope.   Patient earlier today got up from a stool and was ambulating across a room at his house when he acutely felt short of breath and felt like he was going to pass out. He had to lay on his abdomen in order to regain his breath.  Denies any chest pain or palpitation.  He also notes decrease appetite for the past 7 days and has decrease intake. No vomiting. He has chronic diarrhea but that has been improving on Creon he started 2 weeks ago. Felt a slight sharp pain to LLQ abdomen but resolved. Felt intermittent burning with urination. No fever.  Reports increase stress this week from estranged relationship with daughter.  On arrival to ED, he was afebrile but tachycardiac HR 122, tachypneic 23 RR and normotensive on room air.   CBC without leukocytosis, hgb of 8.2 with hct 24 appears diluted compare to previous Hgb of 10. Lactate elevated at 2.7.   BMP without electrolyte abnormalities. Cr at 7.96 with BUN of 99.   CTA chest with nonspecific findings in the duodenal, pancreas, pulmonary nodules. Urinary bladder was distended and a UA from in and out cath is pending.   EKG with sinus Manuel and left axis deviation.   He was administered 500cc fluid bolus in ED. He had recurrence of his pre-syncopal symptoms  so hospitalist was consulted for admission.    Review of Systems: As mentioned in the history of present illness. All other systems reviewed and are  negative. Past Medical History:  Diagnosis Date   AKI (acute kidney injury) (HCC) 05/2020   possibly due to prostate blockage   Anxiety    Arthritis    Depression    Dyspnea    Glaucoma    Hypertension    Prostate cancer [facility])    Past Surgical History:  Procedure Laterality Date   DIALYSIS/PERMA CATHETER INSERTION N/A 09/23/2023   Procedure: DIALYSIS/PERMA CATHETER INSERTION;  Surgeon: Ethelene Hal, MD;  Location: [facility] INVASIVE CV LAB;  Service: Cardiovascular;  Laterality: N/A;   DIALYSIS/PERMA CATHETER REMOVAL N/A 09/23/2023   Procedure: DIALYSIS/PERMA CATHETER REMOVAL;  Surgeon: Ethelene Hal, MD;  Location: [facility] INVASIVE CV LAB;  Service: Cardiovascular;  Laterality: N/A;   growth in nostril     biopsied negatiove   IR FLUORO GUIDE CV LINE RIGHT  05/12/2020   IR FLUORO GUIDE CV LINE RIGHT  05/28/2020   IR US GUIDE VASC ACCESS RIGHT  05/12/2020   IR US GUIDE VASC ACCESS RIGHT  05/28/2020   TRANSURETHRAL RESECTION OF PROSTATE N/A 09/13/2020   Procedure: TRANSURETHRAL RESECTION OF THE PROSTATE (TURP) WITH CYSTOSCOPY;  Surgeon: Heloise Purpura, MD;  Location: [facility] ORS;  Service: Urology;  Laterality: N/A;   Social History:  reports that he has never smoked. He has never used smokeless tobacco. He reports current alcohol use of about 1.0 standard drink of alcohol per week. He reports that he does not use drugs. Pt is divorced and has a 65 yo daughter.  He currently teaches Actuary at [employer]  Allergies  Allergen Reactions   Penicillins Other (See Comments)    unknown    Family History  Problem Relation Age of Onset   Stroke Mother    Breast cancer Neg Hx    Colon cancer Neg Hx    Prostate cancer Neg Hx    Pancreatic cancer Neg Hx     Prior to Admission medications   Medication Sig Start Date End Date Taking? Authorizing Provider  amLODipine (NORVASC) 10 MG tablet Take by mouth. Patient not taking: Reported on 05/14/2023 03/18/22   [provider]  atorvastatin  (LIPITOR) 10 MG tablet Take 10 mg by mouth daily.    [provider]  azelastine (ASTELIN) 0.1 % nasal spray one spray by Both Nostrils route 2 (two) times daily. Use in each nostril as directed 09/17/22 09/17/23  [provider]  benzonatate (TESSALON) 100 MG capsule Take 100 mg by mouth 3 (three) times daily as needed. 09/17/22   [provider]  calcium carbonate (TUMS) 500 MG chewable tablet Chew by mouth. 03/18/22   [provider]  ciprofloxacin (CIPRO) 250 MG tablet Take 250 mg by mouth daily. 10/17/22   [provider]  clotrimazole-betamethasone (LOTRISONE) cream APPLY 1 APPLICATION TOPICALLY DAILY 04/13/23   Edwin Cap, DPM  diphenhydrAMINE (BENADRYL) 25 mg capsule Take 50 mg by mouth every 4 (four) hours as needed for itching or allergies.     [provider]  divalproex (DEPAKOTE SPRINKLE) 125 MG capsule Take by mouth. 06/12/20   [provider]  docusate calcium (SURFAK) 240 MG capsule Take by mouth. 06/12/20   [provider]  docusate sodium (COLACE) 100 MG capsule Take 1 capsule (100 mg total) by mouth 2 (two) times daily. 06/01/20   Pokhrel, Rebekah Chesterfield, MD  ferric citrate (AURYXIA) 1 GM 210 MG(Fe) tablet Take 420 mg by mouth 3 (three) times daily with meals.     [provider]  FLUoxetine (PROZAC) 40 MG capsule Take 1 capsule (40 mg total) by mouth daily. Patient taking differently: Take 50 mg by mouth 2 (two) times daily. 06/02/20   Pokhrel, Rebekah Chesterfield, MD  fluticasone (FLONASE) 50 MCG/ACT nasal spray Place 1 spray into both nostrils daily as needed for allergies or rhinitis.    [provider]  ketoconazole (NIZORAL) 2 % cream Apply topically daily. 11/17/22 11/17/23  [provider]  latanoprost (XALATAN) 0.005 % ophthalmic solution Place 1 drop into the right eye at bedtime.  12/19/19   [provider]  LOPERAMIDE HCL PO Take by mouth. 10/25/22 10/24/23  [provider]  magnesium  oxide (MAG-OX) 400 (241.3 Mg) MG tablet Take 1 tablet (400 mg total) by mouth daily. 06/02/20   Pokhrel, Rebekah Chesterfield, MD  Magnesium Oxide 400 MG CAPS Take by mouth. 06/12/20   [provider]  melatonin 5 MG TABS Take 1 tablet (5 mg total) by mouth at bedtime. Patient taking differently: Take 5 mg by mouth at bedtime as needed (sleep). 06/01/20   Pokhrel, Rebekah Chesterfield, MD  Nutritional Supplements (FEEDING SUPPLEMENT, NEPRO CARB STEADY,) LIQD Take 237 mLs by mouth 2 (two) times daily between meals. 06/01/20   Pokhrel, Rebekah Chesterfield, MD  Psyllium 0.36 g CAPS Take by mouth. 06/12/20   [provider]  QUEtiapine (SEROQUEL) 200 MG tablet Take 200 mg by mouth at bedtime.  11/14/19   [provider]  SENSIPAR 30 MG tablet Take by mouth. 09/01/22   [provider]  sertraline (ZOLOFT)  100 MG tablet Take 100 mg by mouth 2 (two) times daily. 150mg  daily    [provider]  sildenafil (VIAGRA) 100 MG tablet SMARTSIG:0.5 Tablet(s) By Mouth As Directed 10/29/22   [provider]  tamsulosin (FLOMAX) 0.4 MG CAPS capsule Take 2 capsules (0.8 mg total) by mouth daily after supper. 06/01/20   Pokhrel, Rebekah Chesterfield, MD  timolol (TIMOPTIC) 0.5 % ophthalmic solution Place 1 drop into the right eye in the morning and at bedtime.  07/14/19   [provider]  zolpidem (AMBIEN) 5 MG tablet Take 1 tablet (5 mg total) by mouth at bedtime as needed for sleep. 06/13/20   Narda Bonds, MD    Physical Exam: Vitals:   10/09/23 1208 10/09/23 1330 10/09/23 1430 10/09/23 1730  BP:  126/78 124/81 (!) 163/90  Pulse:  100 99 94  Resp:  14 13 16   Temp:    99.9 F (37.7 C)  TempSrc:      SpO2:  99% 100% 100%  Weight: 88.5 kg     Height: 6\' 1"  (1.854 m)      Constitutional: NAD, calm, comfortable, elderly male lying flat in bed appears anxious at baseline.  Patient became more dyspneic and hyperventilating upon standing with subsequent symptoms of dizziness. Eyes: lids and conjunctivae  normal ENMT: Mucous membranes are moist. Neck: normal, supple Respiratory: clear to auscultation bilaterally, no wheezing, no crackles. Normal respiratory effort. No accessory muscle use.  On room air Cardiovascular: Regular rate and Manuel, no murmurs / rubs / gallops. No extremity edema. Abdomen: no tenderness, soft  musculoskeletal: no clubbing / cyanosis. No joint deformity upper and lower extremities. Good ROM, no contractures. Normal muscle tone.  Skin: no rashes, lesions, ulcers. No induration Neurologic: CN 2-12 grossly intact. Strength 5/5 in all 4.  Psychiatric: Normal judgment and insight. Alert and oriented x 3. Anxious mood. Data Reviewed:  See HPI  Assessment and Plan: * Pre-syncope -pt presented with pre-syncope and dyspnea on exertion. Has missed 2 dialysis session and Hgb also appears diluted. On evaluation, I attempted to perform orthostatic vital signs but upon standing pt began to have tachypnea and hyperventilating causing him to feel dizzy. He also was very anxious at baseline and mentioned increased family stress this week. Suspect pre-syncope sensation brought on by hyperventilation/dyspnea. Dyspnea likely multifactorial from volume overload from missed dialysis and psychosomatic from anxiety -His workup otherwise negative for infection pending UA. He is afebrile and has no leukocytosis. CTA chest/abd/pelvis was negative for acute findings.  -He had recent echocardiogram 06/2023 with EF of 55-60%. Mild asymmetric LVH of the septal segment. Normal diastolic perimeters. No significant valvular abnormalities. Aside from dyspnea he has no other cardiac symptoms. EKG without any significant findings. Low suspicion for cardiac cause so will not repeat echo. Will monitor on continuous telemetry overnight. -will transfer [facility] for [facility] in the morning.  All other electrolytes are within normal limits so no urgent dialysis needed overnight. -Will dose one-time IV Ativan for  anxiety  Essential hypertension - Normotensive not on home antihypertensives  Pancreatic cyst -Noted incidentally on the CT imaging.  No active abdominal issue at this time other than nausea and vomiting likely secondary to uremia buildup secondary to missed dialysis session. -Pt has pancreatic insufficiency with chronic diarrhea but is improving with Creon  Anxiety -Suspect this is one contributing factor to his dyspnea on exertion.  Patient reports recent family stress with daughter. -will continue sertraline -one -time IV ativan -discussed with pt that  his symptoms are likely psychosomatic and he plans to find therapeutist   Pulmonary nodule -Noncalcified bilateral lung nodules measuring up to 7 mm, 1 on each side noted on imaging. Non contrast CT recommended in 3-6 months.   Prostate cancer (HCC) - S/p TURP -Follows with [facility] Dr. Laverle Patter  ESRD on dialysis [facility]) -TTS HD  -Missed 2 sessions this week -No urgent dialysis needs overnight.  Will need to transfer to [facility] for dialysis session tomorrow.  Anemia in chronic kidney disease -hgb of 8.2 on presentation with hematocrit 24.  Appears diluted from prior baseline of 10 -Will need hemodialysis in the morning -Typically receives ESA and IV iron at dialysis center      Advance Care Planning:   Code Status: Full Code   Consults: needs nephrology consult after arrival to count  Family Communication: None at bedside  Severity of Illness: The appropriate patient status for this patient is OBSERVATION. Observation status is judged to be reasonable and necessary in order to provide the required intensity of service to ensure the patient's safety. The patient's presenting symptoms, physical exam findings, and initial radiographic and laboratory data in the context of their medical condition is felt to place them at decreased risk for further clinical deterioration. Furthermore, it is anticipated that the patient will be  medically stable for discharge from the hospital within 2 midnights of admission.   Author: Anselm Jungling, DO 10/09/2023 9:15 PM  For on call review [email or web address].

## 2023-10-09 NOTE — ED Notes (Signed)
Patient c/o shortness of breath and feeling like he was going to pass out during both discharge attempts. The provider had to be messaged both times. Patient was in no obvious distress and vital signs were stable. He did express some anxiety and depression, and that he was going through some personal issues at home. Denied Suicidal Ideation.

## 2023-10-09 NOTE — ED Notes (Addendum)
[facility] here to get patient. Patient leaving AMA.

## 2023-10-09 NOTE — Assessment & Plan Note (Signed)
-   S/p TURP -Follows with [facility] Dr. Laverle Patter

## 2023-10-09 NOTE — Assessment & Plan Note (Signed)
-  TTS HD  -Missed 2 sessions this week -No urgent dialysis needs overnight.  Will need to transfer to [facility] for dialysis session tomorrow.

## 2023-10-09 NOTE — Assessment & Plan Note (Signed)
-  Suspect this is one contributing factor to his dyspnea on exertion.  Patient reports recent family stress with daughter. -will continue sertraline -one -time IV ativan -discussed with pt that his symptoms are likely psychosomatic and he plans to find therapeutist

## 2023-10-09 NOTE — Assessment & Plan Note (Signed)
-  hgb of 8.2 on presentation with hematocrit 24.  Appears diluted from prior baseline of 10 -Will need hemodialysis in the morning -Typically receives ESA and IV iron at dialysis center

## 2023-10-09 NOTE — ED Notes (Signed)
ED TO INPATIENT HANDOFF REPORT  Name/Age/Gender Manuel Ortega 72 y.o. male  Code Status    Code Status Orders  (From admission, onward)           Start     Ordered   10/09/23 2053  Full code  Continuous       Question:  By:  Answer:  Consent: discussion documented in EHR   10/09/23 2052           Code Status History     Date Active Date Inactive Code Status Order ID Comments User Context   09/13/2020 1701 09/15/2020 2039 Full Code [id number]  Heloise Purpura, MD Inpatient   06/11/2020 0015 06/13/2020 1951 Full Code [id number]  Marinda Elk, MD ED   05/11/2020 2230 06/01/2020 2050 Full Code [id number]  Cipriano Bunker, MD ED       Home/SNF/Other Home  Chief Complaint Pre-syncope [R55]  Level of Care/Admitting Diagnosis ED Disposition     ED Disposition  Admit   Condition  --   Comment  Hospital Area: [facility] [[id number]]  Level of Care: Telemetry Medical [104]  May place patient in observation at [facility] or [facility] if equivalent level of care is available:: No  Covid Evaluation: Asymptomatic - no recent exposure (last 10 days) testing not required  Diagnosis: Pre-syncope [304451]  Admitting Physician: Anselm Jungling [[id number]]  Attending Physician: Anselm Jungling [[id number]]          Medical History Past Medical History:  Diagnosis Date   AKI (acute kidney injury) (HCC) 05/2020   possibly due to prostate blockage   Anxiety    Arthritis    Depression    Dyspnea    Glaucoma    Hypertension    Prostate cancer (HCC)     Allergies Allergies  Allergen Reactions   Penicillins Other (See Comments)    unknown    IV Location/Drains/Wounds Patient Lines/Drains/Airways Status     Active Line/Drains/Airways     Name Placement date Placement time Site Days   Peripheral IV 08/20/23 20 G Right Antecubital 08/20/23  1029  Antecubital  50   Hemodialysis Catheter Right Internal jugular 09/23/23  1656  Internal jugular  16   Urethral  Catheter Brianna Double-lumen 16 Fr. 05/11/20  1803  Double-lumen  1246   Urethral Catheter Dr. Laverle Patter Triple-lumen 22 Fr. 09/13/20  1235  Triple-lumen  1121            Labs/Imaging Results for orders placed or performed during the hospital encounter of 10/09/23 (from the past 48 hours)  CBG monitoring, ED     Status: Abnormal   Collection Time: 10/09/23 12:06 PM  Result Value Ref Range   Glucose-Capillary 138 (H) 70 - 99 mg/dL    Comment: Glucose reference range applies only to samples taken after fasting for at least 8 hours.  Comprehensive metabolic panel     Status: Abnormal   Collection Time: 10/09/23  1:23 PM  Result Value Ref Range   Sodium 138 135 - 145 mmol/L   Potassium 4.4 3.5 - 5.1 mmol/L   Chloride 101 98 - 111 mmol/L   CO2 22 22 - 32 mmol/L   Glucose, Bld 97 70 - 99 mg/dL    Comment: Glucose reference range applies only to samples taken after fasting for at least 8 hours.   BUN 99 (H) 8 - 23 mg/dL   Creatinine, Ser 3.29 (H) 0.61 - 1.24 mg/dL   Calcium 51.8 8.9 -  10.3 mg/dL   Total Protein 6.4 (L) 6.5 - 8.1 g/dL   Albumin 3.4 (L) 3.5 - 5.0 g/dL   AST 15 15 - 41 U/L   ALT 14 0 - 44 U/L   Alkaline Phosphatase 62 38 - 126 U/L   Total Bilirubin 0.4 <1.2 mg/dL   GFR, Estimated 7 (L) >60 mL/min    Comment: (NOTE) Calculated using the CKD-EPI Creatinine Equation (2021)    Anion gap 15 5 - 15    Comment: Performed at [facility], [address] [place], [place] [address]  CBC with Differential     Status: Abnormal   Collection Time: 10/09/23  1:23 PM  Result Value Ref Range   WBC 10.2 4.0 - 10.5 K/uL   RBC 2.68 (L) 4.22 - 5.81 MIL/uL   Hemoglobin 8.2 (L) 13.0 - 17.0 g/dL   HCT 13.0 (L) 86.5 - 78.4 %   MCV 91.8 80.0 - 100.0 fL   MCH 30.6 26.0 - 34.0 pg   MCHC 33.3 30.0 - 36.0 g/dL   RDW 69.6 (H) 29.5 - 28.4 %   Platelets 277 150 - 400 K/uL   nRBC 0.0 0.0 - 0.2 %   Neutrophils Relative % 75 %   Neutro Abs 7.7 1.7 - 7.7 K/uL   Lymphocytes  Relative 13 %   Lymphs Abs 1.3 0.7 - 4.0 K/uL   Monocytes Relative 9 %   Monocytes Absolute 0.9 0.1 - 1.0 K/uL   Eosinophils Relative 2 %   Eosinophils Absolute 0.2 0.0 - 0.5 K/uL   Basophils Relative 0 %   Basophils Absolute 0.0 0.0 - 0.1 K/uL   Immature Granulocytes 1 %   Abs Immature Granulocytes 0.07 0.00 - 0.07 K/uL    Comment: Performed at [facility], [address] [place], [place] [address]  Protime-INR     Status: None   Collection Time: 10/09/23  1:23 PM  Result Value Ref Range   Prothrombin Time 12.7 11.4 - 15.2 seconds   INR 0.9 0.8 - 1.2    Comment: (NOTE) INR goal varies based on device and disease states. Performed at [facility], [address] [place], [place] [address]   Urinalysis, w/ Reflex to Culture (Infection Suspected) -Urine, Clean Catch     Status: Abnormal   Collection Time: 10/09/23  1:23 PM  Result Value Ref Range   Specimen Source URINE, CATHETERIZED    Color, Urine STRAW (A) YELLOW   APPearance CLEAR CLEAR   Specific Gravity, Urine 1.013 1.005 - 1.030   pH 7.0 5.0 - 8.0   Glucose, UA NEGATIVE NEGATIVE mg/dL   Hgb urine dipstick NEGATIVE NEGATIVE   Bilirubin Urine NEGATIVE NEGATIVE   Ketones, ur NEGATIVE NEGATIVE mg/dL   Protein, ur 30 (A) NEGATIVE mg/dL   Nitrite NEGATIVE NEGATIVE   Leukocytes,Ua MODERATE (A) NEGATIVE   RBC / HPF 0-5 0 - 5 RBC/hpf   WBC, UA 21-50 0 - 5 WBC/hpf    Comment:        Reflex urine culture not performed if WBC <=10, OR if Squamous epithelial cells >5. If Squamous epithelial cells >5 suggest recollection.    Bacteria, UA NONE SEEN NONE SEEN   Squamous Epithelial / HPF 0-5 0 - 5 /HPF    Comment: Performed at [facility], [address], [place], [place] [address]  I-Stat Lactic Acid, ED     Status: Abnormal   Collection Time: 10/09/23  1:32 PM  Result Value Ref Range   Lactic Acid, Venous 2.7 (HH) 0.5 - 1.9 mmol/L   Comment NOTIFIED PHYSICIAN     CT ANGIO CHEST/ABD/PEL FOR DISSECTION W &/OR WO CONTRAST Result Date: 10/09/2023 CLINICAL DATA:  Near-syncope with pain and nausea. EXAM: CT ANGIOGRAPHY CHEST, ABDOMEN AND PELVIS TECHNIQUE: Non-contrast CT of the chest was initially obtained. Multidetector CT imaging through the chest, abdomen and pelvis was performed using the standard protocol during bolus administration of intravenous contrast. Multiplanar reconstructed images and MIPs were obtained and reviewed to evaluate the vascular anatomy. RADIATION DOSE REDUCTION: This exam was performed according to the departmental dose-optimization program which includes automated exposure control, adjustment of the mA and/or kV according to patient size and/or use of iterative reconstruction technique. CONTRAST:  OMNIPAQUE IOHEXOL 350 MG/ML SOLN COMPARISON:  Chest x-ray 10/09/2023 earlier. Abdomen and pelvis CT without contrast 05/11/2020. No prior chest CT. FINDINGS: CTA CHEST FINDINGS Cardiovascular: On the noncontrast dataset there is no curvilinear high density along the course of the thoracic aorta. Postcontrast images are late for the arterial phase. Appearance is more nephrographic phase or late portal venous phase limiting evaluation for subtle arterial abnormality. There is scattered atherosclerotic calcified plaque along the thoracic aorta, mild. No clear evidence of dissection or aneurysm formation. Diameter of the ascending aorta at the level of the right pulmonary artery approaches 3.3 cm. There is pulsation artifact. The descending thoracic aorta same level measures 2.8 by 2.7 cm. Diameter of the aortic root approaches 3.1 cm and distal aortic arch 3.0 cm. The origin of the great vessels are grossly preserved. Scattered calcified plaque identified along the great vessels proximally. The heart is nonenlarged. Trace pericardial fluid. There is a right IJ catheter in place with tip extending to the SVC right atrial junction region.  Mediastinum/Nodes: Patulous esophagus. Small thyroid gland. No specific abnormal lymph node enlargement identified in the axillary regions, hilum or mediastinum. Lungs/Pleura: No consolidation, pneumothorax or effusion. Minimal areas of scarring atelectatic changes identified. Mild lower lung bronchiectasis on the right. There is a calcified nodule right lower lobe on series 6, image 104 consistent with old granulomatous disease. Slight basilar atelectasis or scar as well. Noncalcified right lower lobe lung nodule measures 7 mm on series 6, image 93. 7 mm left lower lobe lung nodule noncalcified series 6, image 77 as well. Musculoskeletal: Mild degenerative changes along the spine. Review of the MIP images confirms the above findings. CTA ABDOMEN AND PELVIS FINDINGS VASCULAR. Contrast bolus is more in the late portal venous phase early nephrographic phase limiting evaluation for subtle vascular abnormality. Aorta: Mild atherosclerotic calcified plaque. No dissection or aneurysm formation. Celiac: Standard branching pattern of the celiac axis. No significant stenosis. Mild atherosclerotic plaque. SMA: Mild partially calcified atherosclerotic plaque proximally and along the midportion of the vessel. No significant stenosis centrally. Renals: Mild calcified plaque along the proximal aspects of both renal arteries. Single bilateral main renal arteries are identified. IMA: Patent without evidence of aneurysm, dissection, vasculitis or significant stenosis. Inflow: The iliac vessels are grossly preserved in caliber. Scattered calcified mild plaque identified. No significant stenosis. Veins: No obvious venous abnormality within the limitations of this arterial phase study. Review of the MIP images confirms the above findings. NON-VASCULAR Hepatobiliary: Stable low-attenuation dome lesion on series 10, image 118 measuring 14 mm. Not clearly a simple cyst. Gallbladder is nondilated. Patent portal vein. Pancreas: Mild  pancreatic atrophy. There is small cystic area in the uncinate process with some dystrophic calcification measuring 13 mm. Second  smaller focus towards the tail on series 10, image 150 measuring 7 mm. Third lesion in the body on coronal series 12, image 64. Recommend further evaluation. Spleen: Normal in size without focal abnormality. Adrenals/Urinary Tract: Stable nodular thickening of both adrenal glands. Moderate atrophy of the kidneys. No collecting system dilatation. The ureters have normal course and caliber extending down to the urinary bladder. Preserved contours of the urinary bladder. Bladder wall slightly thickened and trabeculated. Bilateral Bosniak 1 renal cysts are identified, left-greater-than-right. Some smaller Bosniak 2 lesions as well. No significant imaging follow-up of these renal lesions at this time. Stomach/Bowel: On this non oral contrast exam large bowel has a normal course and caliber with some scattered stool. Colonic diverticulosis seen particularly along the left side. Normal appendix extends medial to the cecum in the right lower quadrant. There is moderate fluid and debris along the stomach. Slight fold thickening suggested along the proximal duodenal such as series 10, image 168. There is some prominent adjacent lymph nodes as well. Please correlate with symptoms. Otherwise the small bowel is nondilated. Several loops distally are fluid-filled. Lymphatic: Few prominent lymph nodes in the central mesentery near the pancreas and duodenal. These not pathologic by size criteria. No abnormal lymph nodes in the abdomen and pelvis. Reproductive: Prominent prostate. Mass effect along the base of the bladder. Please correlate for any history of the TURP. Other: Mild anasarca. Musculoskeletal: Mild degenerative changes along the spine and pelvis. Trace anterolisthesis of L4 on L5. Multilevel lumbar spine mild disc bulging. Review of the MIP images confirms the above findings. IMPRESSION:  Evaluation limited due to the timing of the contrast bolus, more late portal venous phase. Scattered calcified atherosclerotic plaque diffusely without clear evidence of dissection or aneurysm formation. Bilateral renal atrophy.  Benign appearing renal cystic foci. Colonic diverticulosis.  No bowel obstruction. There is slight wall thickening along the proximal duodenal with some small adjacent nodes which are not pathologic by size criteria but more numerous than usually seen. Please correlate with any symptoms and further workup of the duodenal when clinically appropriate Small cystic areas along the pancreas. Based on size and appearance please correlate with any prior to assess for long-term stability. However these are not seen in 2021 noncontrast CT. Otherwise recommend dedicated MRI evaluation when clinically appropriate. Noncalcified bilateral lung nodules measuring up to 7 mm, 1 on each side. There are some separate calcified foci. Please correlate with any prior to assess for stability otherwise Non-contrast chest CT at 3-6 months is recommended. If the nodules are stable at time of repeat CT, then future CT at 18-24 months (from today's scan) is considered optional for low-risk patients, but is recommended for high-risk patients. This recommendation follows the consensus statement: Guidelines for Management of Incidental Pulmonary Nodules Detected on CT Images: From the Fleischner Society 2017; Radiology 2017; 284:228-243. Distended urinary bladder with an enlarged prostate. Bladder wall thickening and trabeculation. Electronically Signed   By: Karen Kays M.D.   On: 10/09/2023 18:56   DG Chest 2 View Result Date: 10/09/2023 CLINICAL DATA:  Suspected sepsis. Abdominal pain. Generalized weakness. EXAM: CHEST - 2 VIEW COMPARISON:  09/19/2023. FINDINGS: Low lung volume. Bilateral lung fields are clear. Bilateral costophrenic angles are clear. Normal cardio-mediastinal silhouette. No acute osseous  abnormalities. The soft tissues are within normal limits. Right IJ hemodialysis catheter noted with its tip overlying the lower portion of superior vena cava. IMPRESSION: No active cardiopulmonary disease. Electronically Signed   By: Timoteo Expose.D.  On: 10/09/2023 12:44    Pending Labs Unresulted Labs (From admission, onward)     Start     Ordered   10/10/23 0500  CBC  Tomorrow morning,   R        10/09/23 2052   10/10/23 0500  Basic metabolic panel  Tomorrow morning,   R        10/09/23 2052   10/09/23 1323  Urine Culture  Once,   R        10/09/23 1323            Vitals/Pain Today's Vitals   10/09/23 1430 10/09/23 1730 10/09/23 1954 10/09/23 2100  BP: 124/81 (!) 163/90    Pulse: 99 94    Resp: 13 16  18   Temp:  99.9 F (37.7 C)  98.4 F (36.9 C)  TempSrc:    Oral  SpO2: 100% 100%    Weight:      Height:      PainSc:   0-No pain     Isolation Precautions No active isolations  Medications Medications  heparin injection 5,000 Units (5,000 Units Subcutaneous Given 10/09/23 2216)  sertraline (ZOLOFT) tablet 200 mg (has no administration in time range)  lactated ringers bolus 500 mL (500 mLs Intravenous New Bag/Given 10/09/23 1651)  iohexol (OMNIPAQUE) 350 MG/ML injection 100 mL (100 mLs Intravenous Contrast Given 10/09/23 1759)  LORazepam (ATIVAN) injection 0.5 mg (0.5 mg Intravenous Given 10/09/23 2141)    Mobility walks with person assist

## 2023-10-09 NOTE — Assessment & Plan Note (Signed)
-  Noncalcified bilateral lung nodules measuring up to 7 mm, 1 on each side noted on imaging. Non contrast CT recommended in 3-6 months.

## 2023-10-09 NOTE — ED Triage Notes (Signed)
Pt arrives via POV. Pt reports he hasn't had dialysis since Monday. PT reports abdominal pain, dizziness, nausea, and generalized weakness. Pt AxOx4.

## 2023-10-09 NOTE — ED Provider Notes (Signed)
Pt initially seen by Dr Doran Durand.  Pt with multiple medical problems.  Pt on dialysis although still urinates.  He has not been to dialysis for  about a week.  Pt continues to have near syncopal episodes in the ED.   CT scan without acute findings except urinary retention and  several incidental findings .  With his recurrent near syncope will consult with medical service for admission.  Will check UA and do in and out cath Case discussed with Dr Franchot Heidelberg, MD 10/09/23 [phone]

## 2023-10-09 NOTE — ED Provider Notes (Incomplete Revision)
[place] EMERGENCY DEPARTMENT AT [facility] Provider Note   CSN: [id number] Arrival date & time: 10/09/23  1159     History Chief Complaint  Patient presents with   Near Syncope   Abdominal Pain   Nausea    HPI Manuel Ortega is a 72 y.o. male presenting for chief complaint of near syncope, abdominal pain, nausea, weight loss fatigue. States that he is a recent to ESRD diagnosis and has been started on dialysis.  He skipped 2 sessions because of the holiday and the third session has been delayed till tomorrow.  He denies fevers chills nausea vomiting syncope but is stating that he started having dyspnea on exertion approximately 36 hours ago feelings of malaise and fatigue. He also endorses that over the last 2 weeks he has had very little p.o. intake only using protein shakes daily as he states he has early satiety with any attempted normal meals. Is able to tolerate small sips at this time otherwise no acute distress.   Patient's recorded medical, surgical, social, medication list and allergies were reviewed in the Snapshot window as part of the initial history.   Review of Systems   Review of Systems  Constitutional:  Positive for appetite change and fatigue. Negative for chills and fever.  HENT:  Negative for ear pain and sore throat.   Eyes:  Negative for pain and visual disturbance.  Respiratory:  Negative for cough and shortness of breath.   Cardiovascular:  Negative for chest pain and palpitations.  Gastrointestinal:  Negative for abdominal pain and vomiting.  Genitourinary:  Negative for dysuria and hematuria.  Musculoskeletal:  Negative for arthralgias and back pain.  Skin:  Negative for color change and rash.  Neurological:  Negative for seizures and syncope.  All other systems reviewed and are negative.   Physical Exam Updated Vital Signs BP 124/81   Pulse 99   Temp 98.6 F (37 C) (Oral)   Resp 13   Ht 6\' 1"  (1.854 m)   Wt 88.5 kg   SpO2  100%   BMI 25.73 kg/m  Physical Exam Vitals and nursing note reviewed.  Constitutional:      General: He is not in acute distress.    Appearance: He is well-developed.  HENT:     Head: Normocephalic and atraumatic.  Eyes:     Conjunctiva/sclera: Conjunctivae normal.  Cardiovascular:     Rate and Rhythm: Normal rate and regular rhythm.     Heart sounds: No murmur heard. Pulmonary:     Effort: Pulmonary effort is normal. No respiratory distress.     Breath sounds: Normal breath sounds.  Abdominal:     Palpations: Abdomen is soft.     Tenderness: There is no abdominal tenderness.  Musculoskeletal:        General: No swelling.     Cervical back: Neck supple.  Skin:    General: Skin is warm and dry.     Capillary Refill: Capillary refill takes less than 2 seconds.  Neurological:     Mental Status: He is alert.  Psychiatric:        Mood and Affect: Mood normal.      ED Course/ Medical Decision Making/ A&P    Procedures Procedures   Medications Ordered in ED Medications - No data to display  Medical Decision Making:    Manuel Ortega is a 72 y.o. male who presented to the ED today with a myriad of complaints detailed above.     Patient  placed on continuous vitals and telemetry monitoring while in ED which was reviewed periodically.   Complete initial physical exam performed, notably the patient  was  Hemodynamically stable no acute distress.  Benign physical exam with no focal pathology identified.  Tachycardic when walking into the room but that is improved as patient was at rest.      Reviewed and confirmed nursing documentation for past medical history, family history, social history.    Initial Assessment:   With the patient's presentation of generalized malaise and fatigue, most likely diagnosis is slight volume overload in the setting of ESRD and missed dialysis. Other diagnoses were considered including (but not limited to) metabolic crisis, hematologic crisis,  infectious etiology. These are considered less likely due to history of present illness and physical exam findings.   This is most consistent with an acute life/limb threatening illness complicated by underlying chronic conditions.  Initial Plan:  Screening labs including CBC and Metabolic panel to evaluate for infectious or metabolic etiology of disease.  Lactic acid.  UA to rule out infectious etiology of sxs CXR to evaluate for structural/infectious intrathoracic pathology.  Objective evaluation as below reviewed with plan for close reassessment  Initial Study Results:   Laboratory  All laboratory results reviewed without evidence of clinically relevant pathology.    EKG EKG was reviewed independently. Rate, rhythm, axis, intervals all examined and without medically relevant abnormality. ST segments without concerns for elevations.    Reassessment and Plan:   No focal pathology.  Slight lactic acid elevation but this is nonspecific given the otherwise completely reassuring evaluation.  Remainder of labs normal.  Heart rate returned to normal. Patient was comfortable with outpatient care management.  He has dialysis scheduled for approximately 12 hours from now and I believe he can safely be followed up outpatient.  Reassessment: I was called back to the patient's bedside. He is irate that he needs charge nurse to complain about staff.  Per the bedside nurse he tried to ambulate and had another near syncope episode. He feels like he is too unsteady to ambulate because of the severity of his symptoms. Shared medical decision making with patient, I feel that likely this is subacute and unlikely to be representative of any emergent pathology but patient is strongly in disagreement. He is advocating aggressively for further workup. Given his age and severity of symptoms, aortic dissection, pulmonary embolism would be in the differential.  The only study to further evaluate this would be a CT  scan with contrast.  Discussed with patient and he is willing to proceed with contrast evaluation.  CT angiography chest abdomen pelvis ordered to evaluate for pulmonary embolism, aortic dissection or any other severe pathology.   Emergency Department Medication Summary:   Medications - No data to display       Clinical Impression:  1. Generalized abdominal pain      Data Unavailable   Final Clinical Impression(s) / ED Diagnoses Final diagnoses:  Generalized abdominal pain    Rx / DC Orders ED Discharge Orders     None         Glyn Ade, MD 10/09/23 1507

## 2023-10-09 NOTE — Assessment & Plan Note (Addendum)
-  pt presented with pre-syncope and dyspnea on exertion. Has missed 2 dialysis session and Hgb also appears diluted. On evaluation, I attempted to perform orthostatic vital signs but upon standing pt began to have tachypnea and hyperventilating causing him to feel dizzy. He also was very anxious at baseline and mentioned increased family stress this week. Suspect pre-syncope sensation brought on by hyperventilation/dyspnea. Dyspnea likely multifactorial from volume overload from missed dialysis and psychosomatic from anxiety -His workup otherwise negative for infection pending UA. He is afebrile and has no leukocytosis. CTA chest/abd/pelvis was negative for acute findings.  -He had recent echocardiogram 06/2023 with EF of 55-60%. Mild asymmetric LVH of the septal segment. Normal diastolic perimeters. No significant valvular abnormalities. Aside from dyspnea he has no other cardiac symptoms. EKG without any significant findings. Low suspicion for cardiac cause so will not repeat echo. Will monitor on continuous telemetry overnight. -will transfer to [facility] for dialysis in the morning.  All other electrolytes are within normal limits so no urgent dialysis needed overnight. -Will dose one-time IV Ativan for anxiety

## 2023-10-09 NOTE — ED Provider Notes (Signed)
[place] EMERGENCY DEPARTMENT AT [facility] Provider Note   CSN: [id number] Arrival date & time: 10/09/23  1159     History Chief Complaint  Patient presents with   Near Syncope   Abdominal Pain   Nausea    HPI Manuel Ortega is a 72 y.o. male presenting for chief complaint of near syncope, abdominal pain, nausea, weight loss fatigue. States that he is a recent to ESRD diagnosis and has been started on dialysis.  He skipped 2 sessions because of the holiday and the third session has been delayed till tomorrow.  He denies fevers chills nausea vomiting syncope but is stating that he started having dyspnea on exertion approximately 36 hours ago feelings of malaise and fatigue. He also endorses that over the last 2 weeks he has had very little p.o. intake only using protein shakes daily as he states he has early satiety with any attempted normal meals. Is able to tolerate small sips at this time otherwise no acute distress.   Patient's recorded medical, surgical, social, medication list and allergies were reviewed in the Snapshot window as part of the initial history.   Review of Systems   Review of Systems  Constitutional:  Positive for appetite change and fatigue. Negative for chills and fever.  HENT:  Negative for ear pain and sore throat.   Eyes:  Negative for pain and visual disturbance.  Respiratory:  Negative for cough and shortness of breath.   Cardiovascular:  Negative for chest pain and palpitations.  Gastrointestinal:  Negative for abdominal pain and vomiting.  Genitourinary:  Negative for dysuria and hematuria.  Musculoskeletal:  Negative for arthralgias and back pain.  Skin:  Negative for color change and rash.  Neurological:  Negative for seizures and syncope.  All other systems reviewed and are negative.   Physical Exam Updated Vital Signs BP 124/81   Pulse 99   Temp 98.6 F (37 C) (Oral)   Resp 13   Ht 6\' 1"  (1.854 m)   Wt 88.5 kg   SpO2  100%   BMI 25.73 kg/m  Physical Exam Vitals and nursing note reviewed.  Constitutional:      General: He is not in acute distress.    Appearance: He is well-developed.  HENT:     Head: Normocephalic and atraumatic.  Eyes:     Conjunctiva/sclera: Conjunctivae normal.  Cardiovascular:     Rate and Rhythm: Normal rate and regular rhythm.     Heart sounds: No murmur heard. Pulmonary:     Effort: Pulmonary effort is normal. No respiratory distress.     Breath sounds: Normal breath sounds.  Abdominal:     Palpations: Abdomen is soft.     Tenderness: There is no abdominal tenderness.  Musculoskeletal:        General: No swelling.     Cervical back: Neck supple.  Skin:    General: Skin is warm and dry.     Capillary Refill: Capillary refill takes less than 2 seconds.  Neurological:     Mental Status: He is alert.  Psychiatric:        Mood and Affect: Mood normal.      ED Course/ Medical Decision Making/ A&P Clinical Course as of 10/10/23 1300  Fri Oct 09, 2023  1906 CT scan abdomen pelvis does show slight thickening in the proximal duodenal area.,  Incidental cystic lesion of the pancreas noncalcified lung nodules.  Patient does have distended urinary bladder with an enlarged prostate. [JK]  Clinical Course User Index [JK] Linwood Dibbles, MD    Procedures Procedures   Medications Ordered in ED Medications - No data to display  Medical Decision Making:    Manuel Ortega is a 72 y.o. male who presented to the ED today with a myriad of complaints detailed above.     Patient placed on continuous vitals and telemetry monitoring while in ED which was reviewed periodically.   Complete initial physical exam performed, notably the patient  was  Hemodynamically stable no acute distress.  Benign physical exam with no focal pathology identified.  Tachycardic when walking into the room but that is improved as patient was at rest.      Reviewed and confirmed nursing documentation  for past medical history, family history, social history.    Initial Assessment:   With the patient's presentation of generalized malaise and fatigue, most likely diagnosis is slight volume overload in the setting of ESRD and missed dialysis. Other diagnoses were considered including (but not limited to) metabolic crisis, hematologic crisis, infectious etiology. These are considered less likely due to history of present illness and physical exam findings.   This is most consistent with an acute life/limb threatening illness complicated by underlying chronic conditions.  Initial Plan:  Screening labs including CBC and Metabolic panel to evaluate for infectious or metabolic etiology of disease.  Lactic acid.  UA to rule out infectious etiology of sxs CXR to evaluate for structural/infectious intrathoracic pathology.  Objective evaluation as below reviewed with plan for close reassessment  Initial Study Results:   Laboratory  All laboratory results reviewed without evidence of clinically relevant pathology.    EKG EKG was reviewed independently. Rate, rhythm, axis, intervals all examined and without medically relevant abnormality. ST segments without concerns for elevations.    Reassessment and Plan:   No focal pathology.  Slight lactic acid elevation but this is nonspecific given the otherwise completely reassuring evaluation.  Remainder of labs normal.  Heart rate returned to normal. Patient was comfortable with outpatient care management.  He has dialysis scheduled for approximately 12 hours from now and I believe he can safely be followed up outpatient.  Reassessment: I was called back to the patient's bedside.  Per the bedside nurse he tried to ambulate and had another near syncope episode. He feels like he is too unsteady to ambulate because of the severity of his symptoms. Shared medical decision making with patient, I feel that likely this is subacute and unlikely to be representative  of any emergent pathology but patient is strongly in disagreement. He is advocating aggressively for further workup. Given his age and severity of symptoms, aortic dissection, pulmonary embolism would be in the differential.  The only study to further evaluate this would be a CT scan with contrast.  Discussed with patient and he is willing to proceed with contrast evaluation.  CT angiography chest abdomen pelvis ordered to evaluate for pulmonary embolism, aortic dissection or any other severe pathology.   Emergency Department Medication Summary:   Medications - No data to display       Clinical Impression:  1. Generalized abdominal pain      Data Unavailable   Final Clinical Impression(s) / ED Diagnoses Final diagnoses:  Generalized abdominal pain    Rx / DC Orders ED Discharge Orders     None         Glyn Ade, MD 10/09/23 1507    Glyn Ade, MD 10/10/23 1301

## 2023-10-09 NOTE — ED Notes (Signed)
Called [facility] to transport patient

## 2023-10-09 NOTE — Assessment & Plan Note (Signed)
-   Normotensive not on home antihypertensives

## 2023-10-10 DIAGNOSIS — K862 Cyst of pancreas: Secondary | ICD-10-CM | POA: Diagnosis not present

## 2023-10-10 DIAGNOSIS — K2980 Duodenitis without bleeding: Secondary | ICD-10-CM | POA: Diagnosis not present

## 2023-10-10 DIAGNOSIS — K529 Noninfective gastroenteritis and colitis, unspecified: Secondary | ICD-10-CM | POA: Diagnosis not present

## 2023-10-10 DIAGNOSIS — R911 Solitary pulmonary nodule: Secondary | ICD-10-CM | POA: Diagnosis not present

## 2023-10-10 DIAGNOSIS — K317 Polyp of stomach and duodenum: Secondary | ICD-10-CM | POA: Diagnosis not present

## 2023-10-10 DIAGNOSIS — Z5986 Financial insecurity: Secondary | ICD-10-CM | POA: Diagnosis not present

## 2023-10-10 DIAGNOSIS — I120 Hypertensive chronic kidney disease with stage 5 chronic kidney disease or end stage renal disease: Secondary | ICD-10-CM | POA: Diagnosis not present

## 2023-10-10 DIAGNOSIS — Z9079 Acquired absence of other genital organ(s): Secondary | ICD-10-CM | POA: Diagnosis not present

## 2023-10-10 DIAGNOSIS — N2581 Secondary hyperparathyroidism of renal origin: Secondary | ICD-10-CM | POA: Diagnosis not present

## 2023-10-10 DIAGNOSIS — Z91158 Patient's noncompliance with renal dialysis for other reason: Secondary | ICD-10-CM | POA: Diagnosis not present

## 2023-10-10 DIAGNOSIS — F32A Depression, unspecified: Secondary | ICD-10-CM | POA: Diagnosis not present

## 2023-10-10 DIAGNOSIS — Z992 Dependence on renal dialysis: Secondary | ICD-10-CM | POA: Diagnosis not present

## 2023-10-10 DIAGNOSIS — E8770 Fluid overload, unspecified: Secondary | ICD-10-CM | POA: Diagnosis not present

## 2023-10-10 DIAGNOSIS — E8779 Other fluid overload: Secondary | ICD-10-CM

## 2023-10-10 DIAGNOSIS — K264 Chronic or unspecified duodenal ulcer with hemorrhage: Secondary | ICD-10-CM | POA: Diagnosis not present

## 2023-10-10 DIAGNOSIS — E872 Acidosis, unspecified: Secondary | ICD-10-CM | POA: Diagnosis not present

## 2023-10-10 DIAGNOSIS — D631 Anemia in chronic kidney disease: Secondary | ICD-10-CM | POA: Diagnosis not present

## 2023-10-10 DIAGNOSIS — Z8546 Personal history of malignant neoplasm of prostate: Secondary | ICD-10-CM | POA: Diagnosis not present

## 2023-10-10 DIAGNOSIS — R338 Other retention of urine: Secondary | ICD-10-CM | POA: Diagnosis not present

## 2023-10-10 DIAGNOSIS — K449 Diaphragmatic hernia without obstruction or gangrene: Secondary | ICD-10-CM | POA: Diagnosis not present

## 2023-10-10 DIAGNOSIS — N186 End stage renal disease: Secondary | ICD-10-CM | POA: Diagnosis not present

## 2023-10-10 DIAGNOSIS — D62 Acute posthemorrhagic anemia: Secondary | ICD-10-CM | POA: Diagnosis not present

## 2023-10-10 DIAGNOSIS — R55 Syncope and collapse: Secondary | ICD-10-CM | POA: Diagnosis not present

## 2023-10-10 DIAGNOSIS — K863 Pseudocyst of pancreas: Secondary | ICD-10-CM | POA: Diagnosis not present

## 2023-10-10 DIAGNOSIS — F419 Anxiety disorder, unspecified: Secondary | ICD-10-CM | POA: Diagnosis not present

## 2023-10-10 DIAGNOSIS — K861 Other chronic pancreatitis: Secondary | ICD-10-CM | POA: Diagnosis not present

## 2023-10-10 LAB — RETICULOCYTES
Immature Retic Fract: 18.6 % — ABNORMAL HIGH (ref 2.3–15.9)
RBC.: 2.23 MIL/uL — ABNORMAL LOW (ref 4.22–5.81)
Retic Count, Absolute: 83.4 10*3/uL (ref 19.0–186.0)
Retic Ct Pct: 3.7 % — ABNORMAL HIGH (ref 0.4–3.1)

## 2023-10-10 LAB — BASIC METABOLIC PANEL
Anion gap: 14 (ref 5–15)
BUN: 119 mg/dL — ABNORMAL HIGH (ref 8–23)
CO2: 16 mmol/L — ABNORMAL LOW (ref 22–32)
Calcium: 8.8 mg/dL — ABNORMAL LOW (ref 8.9–10.3)
Chloride: 101 mmol/L (ref 98–111)
Creatinine, Ser: 7.9 mg/dL — ABNORMAL HIGH (ref 0.61–1.24)
GFR, Estimated: 7 mL/min — ABNORMAL LOW (ref >60)
Glucose, Bld: 127 mg/dL — ABNORMAL HIGH (ref 70–99)
Potassium: 3.6 mmol/L (ref 3.5–5.1)
Sodium: 131 mmol/L — ABNORMAL LOW (ref 135–145)

## 2023-10-10 LAB — CBC
HCT: 19.7 % — ABNORMAL LOW (ref 39.0–52.0)
Hemoglobin: 6.5 g/dL — CL (ref 13.0–17.0)
MCH: 29.5 pg (ref 26.0–34.0)
MCHC: 33 g/dL (ref 30.0–36.0)
MCV: 89.5 fL (ref 80.0–100.0)
Platelets: 218 10*3/uL (ref 150–400)
RBC: 2.2 MIL/uL — ABNORMAL LOW (ref 4.22–5.81)
RDW: 18.1 % — ABNORMAL HIGH (ref 11.5–15.5)
WBC: 10.5 10*3/uL (ref 4.0–10.5)
nRBC: 0 % (ref 0.0–0.2)

## 2023-10-10 LAB — HEPATITIS B SURFACE ANTIGEN: Hepatitis B Surface Ag: NONREACTIVE

## 2023-10-10 LAB — IRON AND TIBC
Iron: 106 ug/dL (ref 45–182)
Saturation Ratios: 52 % — ABNORMAL HIGH (ref 17.9–39.5)
TIBC: 204 ug/dL — ABNORMAL LOW (ref 250–450)
UIBC: 98 ug/dL

## 2023-10-10 LAB — PREPARE RBC (CROSSMATCH)

## 2023-10-10 LAB — FOLATE: Folate: 12.7 ng/mL (ref >5.9)

## 2023-10-10 LAB — FERRITIN: Ferritin: 706 ng/mL — ABNORMAL HIGH (ref 24–336)

## 2023-10-10 LAB — MRSA NEXT GEN BY PCR, NASAL: MRSA by PCR Next Gen: NOT DETECTED

## 2023-10-10 LAB — HEPATITIS B CORE ANTIBODY, IGM: Hep B C IgM: NONREACTIVE

## 2023-10-10 LAB — VITAMIN B12: Vitamin B-12: 241 pg/mL (ref 180–914)

## 2023-10-10 MED ORDER — DIVALPROEX SODIUM 125 MG PO CSDR
125.0000 mg | DELAYED_RELEASE_CAPSULE | Freq: Two times a day (BID) | ORAL | Status: DC
  Administered 2023-10-10: 125 mg via ORAL
  Filled 2023-10-10 (×2): qty 1

## 2023-10-10 MED ORDER — PANCRELIPASE (LIP-PROT-AMYL) 36000-114000 UNITS PO CPEP
72000.0000 [IU] | ORAL_CAPSULE | Freq: Three times a day (TID) | ORAL | Status: DC
  Administered 2023-10-10 – 2023-10-15 (×16): 72000 [IU] via ORAL
  Filled 2023-10-10 (×17): qty 2

## 2023-10-10 MED ORDER — QUETIAPINE FUMARATE 100 MG PO TABS
200.0000 mg | ORAL_TABLET | Freq: Every day | ORAL | Status: DC
  Administered 2023-10-11 – 2023-10-15 (×5): 200 mg via ORAL
  Filled 2023-10-10 (×6): qty 2

## 2023-10-10 MED ORDER — TAMSULOSIN HCL 0.4 MG PO CAPS
0.8000 mg | ORAL_CAPSULE | Freq: Every day | ORAL | Status: DC
  Administered 2023-10-10: 0.8 mg via ORAL
  Filled 2023-10-10: qty 2

## 2023-10-10 MED ORDER — CHLORHEXIDINE GLUCONATE CLOTH 2 % EX PADS
6.0000 | MEDICATED_PAD | Freq: Every day | CUTANEOUS | Status: DC
  Administered 2023-10-10 – 2023-10-11 (×2): 6 via TOPICAL

## 2023-10-10 MED ORDER — PANCRELIPASE (LIP-PROT-AMYL) 36000-114000 UNITS PO CPEP
36000.0000 [IU] | ORAL_CAPSULE | Freq: Three times a day (TID) | ORAL | Status: DC | PRN
  Administered 2023-10-11 – 2023-10-15 (×5): 36000 [IU] via ORAL
  Filled 2023-10-10 (×4): qty 1

## 2023-10-10 MED ORDER — PANTOPRAZOLE SODIUM 40 MG PO TBEC
40.0000 mg | DELAYED_RELEASE_TABLET | Freq: Two times a day (BID) | ORAL | Status: DC
Start: 2023-10-10 — End: 2023-10-16
  Administered 2023-10-11 – 2023-10-15 (×10): 40 mg via ORAL
  Filled 2023-10-10 (×11): qty 1

## 2023-10-10 MED ORDER — AMLODIPINE BESYLATE 10 MG PO TABS: 10.0000 mg | ORAL_TABLET | Freq: Every day | ORAL | Status: DC

## 2023-10-10 MED ORDER — ALBUMIN HUMAN 25 % IV SOLN
25.0000 g | Freq: Once | INTRAVENOUS | Status: AC
  Administered 2023-10-10: 25 g via INTRAVENOUS
  Filled 2023-10-10: qty 100

## 2023-10-10 MED ORDER — LATANOPROST 0.005 % OP SOLN
1.0000 [drp] | Freq: Every day | OPHTHALMIC | Status: DC
  Administered 2023-10-11 – 2023-10-15 (×4): 1 [drp] via OPHTHALMIC
  Filled 2023-10-10 (×2): qty 2.5

## 2023-10-10 MED ORDER — CALCIUM CARBONATE ANTACID 500 MG PO CHEW
1.0000 | CHEWABLE_TABLET | Freq: Two times a day (BID) | ORAL | Status: DC
  Administered 2023-10-10 – 2023-10-15 (×11): 200 mg via ORAL
  Filled 2023-10-10 (×12): qty 1

## 2023-10-10 MED ORDER — ALUM & MAG HYDROXIDE-SIMETH 200-200-20 MG/5ML PO SUSP
15.0000 mL | ORAL | Status: DC | PRN
  Administered 2023-10-10 – 2023-10-14 (×4): 15 mL via ORAL
  Filled 2023-10-10 (×4): qty 30

## 2023-10-10 MED ORDER — MELATONIN 5 MG PO TABS: 5.0000 mg | ORAL_TABLET | Freq: Every day | ORAL | Status: DC

## 2023-10-10 MED ORDER — MAGNESIUM OXIDE -MG SUPPLEMENT 400 (240 MG) MG PO TABS
400.0000 mg | ORAL_TABLET | Freq: Every day | ORAL | Status: DC
  Administered 2023-10-10: 400 mg via ORAL
  Filled 2023-10-10: qty 1

## 2023-10-10 MED ORDER — TIMOLOL MALEATE 0.5 % OP SOLN
1.0000 [drp] | Freq: Two times a day (BID) | OPHTHALMIC | Status: DC
  Administered 2023-10-10 – 2023-10-15 (×10): 1 [drp] via OPHTHALMIC
  Filled 2023-10-10 (×2): qty 5

## 2023-10-10 MED ORDER — SODIUM CHLORIDE 0.9% IV SOLUTION: Freq: Once | INTRAVENOUS | Status: DC

## 2023-10-10 MED ORDER — MELATONIN 5 MG PO TABS
5.0000 mg | ORAL_TABLET | Freq: Every evening | ORAL | Status: DC | PRN
  Administered 2023-10-10: 5 mg via ORAL
  Filled 2023-10-10: qty 1

## 2023-10-10 MED ORDER — SALINE SPRAY 0.65 % NA SOLN
1.0000 | NASAL | Status: DC | PRN
  Administered 2023-10-10: 1 via NASAL
  Filled 2023-10-10: qty 44

## 2023-10-10 MED ORDER — ATORVASTATIN CALCIUM 10 MG PO TABS
10.0000 mg | ORAL_TABLET | Freq: Every day | ORAL | Status: DC
  Administered 2023-10-10 – 2023-10-15 (×6): 10 mg via ORAL
  Filled 2023-10-10 (×6): qty 1

## 2023-10-10 MED ORDER — FLUOXETINE HCL 40 MG PO CAPS: 40.0000 mg | ORAL_CAPSULE | Freq: Every day | ORAL | Status: DC

## 2023-10-10 MED ORDER — PANTOPRAZOLE SODIUM 40 MG PO TBEC
40.0000 mg | DELAYED_RELEASE_TABLET | Freq: Every day | ORAL | Status: DC
  Administered 2023-10-10: 40 mg via ORAL
  Filled 2023-10-10: qty 1

## 2023-10-10 MED ORDER — DOCUSATE SODIUM 100 MG PO CAPS
100.0000 mg | ORAL_CAPSULE | Freq: Two times a day (BID) | ORAL | Status: DC
  Filled 2023-10-10: qty 1

## 2023-10-10 NOTE — Progress Notes (Addendum)
RN Leeroy Bock called this Chartered loss adjuster and notified me that my patient had fall on the floor ; this RN came in to the patient's room and found that the patient is sitting on the bedside commode with the NT on duty ; this RN immediately assessed the patient, vital signs checked, neuro exam done ; patient is alert and oriented x 4 ; hooked back to oxygen at 2 LPM via Amanda ; this RN asked the NT and patient what happened prior to the fall; NT informed that she was assisting the patient to use the sink when the patient had experienced short of breath and weakness in legs, patient told me that he had to lay on the floor to regain his breathing ; NT told me that she assisted the patient to lie on the floor and called for help and saw Leeroy Bock, RN in the hallway who came into the room and helped the patient to get back up ; notified attending MD  and charge nurse

## 2023-10-10 NOTE — Progress Notes (Addendum)
Notified MD that blood is ready and if it is okay to give blood during dialysis, he told me that he consulted nephrology team about it ; PA nephrology team rounded on patient ; he said  okay to give 1 unit of blood during dialysis, notified the attending MD ; I called the dialysis and spoke with dialysis RN, dialysis RN and told me to give the blood now since they don't know when the patient is getting the dialysis ; I secure chatted the attending and told me to page nephrologist that the patient needs dialysis this evening and to give blood during HD not on the floor as the patient might go to respiratory failure ; patient also complained of feeling passed out on the bed, titrated up the oxygen to 5LPM, MD made aware ; patient complained of no relief from gas, MD Made aware  1715H paged nephro MD oncall  1735H spoke with MD nephrology oncall and told me to give blood during dialysis since the patient is volume overloaded and she told me that she will call the dialysis to notify them to give blood during dialysis

## 2023-10-10 NOTE — Progress Notes (Signed)
Date and time results received:   10/10/23 1610  Test: Hgb  Critical Value: 6.5  Name of Provider Notified: Dr. Joneen Roach.  Orders Received? Or Actions Taken?: Orders Received - See Orders for details

## 2023-10-10 NOTE — ED Notes (Signed)
[facility] here to transport patient. 

## 2023-10-10 NOTE — Care Management Obs Status (Signed)
MEDICARE OBSERVATION STATUS NOTIFICATION   Patient Details  Name: Manuel Ortega MRN: [id number] Date of Birth: 02/02/1951   Medicare Observation Status Notification Given:  Yes    Lawerance Sabal, RN 10/10/2023, 2:10 PM

## 2023-10-10 NOTE — Progress Notes (Addendum)
[facility] PROGRESS NOTE    Progress Note  [facility]  ZOX:[id number] DOB: 08/25/1951 DOA: 10/09/2023 PCP: Bryon Lions, PA-C     Brief Narrative:   Hjalmar Stukes is an 72 y.o. male past medical history of end-stage renal disease on hemodialysis Tuesday Thursdays and Saturdays, prostate cancer status post TURP, hyperparathyroidism, anemia of chronic disease, pancreatic insufficiency BPH who comes in for a near syncopal episode he relates decreased appetite, he relates he got up and started walking and felt short of breath the ED was found tachycardic tachypneic and satting greater 90% on room air. He has missed 2 of his previous dialysis afebrile with no leukocytosis.  CT angio of the chest abdomen and pelvis showed slight thickening of the proximal duodenum, numerous small nonpathologic lymph nodes, small cyst in the pancreas.    Assessment/Plan:   Pre-syncope/volume overload: Due to noncompliance with his dialysis.  He has missed his last 2 sessions, and he relates he has cut short his last 4- 5 sessions before that. On admission orthostatics were checked which were positive.  He is also very anxious person and increased family stress over the last several weeks. Hyperventilation and dyspnea is likely multifactorial in the setting of volume overload in the setting of missed dialysis and psychosomatic. His workup for infectious etiology is unremarkable. CT showed no acute findings. 2D echo this year showed an EF of 60%.  Essential hypertension Normotensive, no antihypertensive medications at home.  Incidental pancreatic pseudocyst/pancreatic insufficiency: Asymptomatic except for nausea and vomiting possibly due to missed dialysis. Resume Creon.  Anxiety: Probably contributing to his part of his symptoms. Restart his sertraline.  Incidental pulmonary nodule: Measuring up to 7 mm will need a follow-up CT of the chest in 3 to 6 months.  History of prostate  cancer: Status post TURP: Noted.  Incisions renal disease on hemodialysis Tuesday Thursdays and Saturdays: He has missed 2 of his hemodialysis sessions this week. Will need dialysis nonurgent.  Acute on chronic kidney disease: Drop in hemoglobin to 6.5. Will go ahead and transfuse 2 units of packed red blood cells with dialysis as he appears to be fluid overloaded on physical exam, recheck hemoglobin posttransfusion.  DVT prophylaxis: lovenox Family Communication:none Status is: Observation The patient remains OBS appropriate and will d/c before 2 midnights.    Code Status:     Code Status Orders  (From admission, onward)           Start     Ordered   10/09/23 2053  Full code  Continuous       Question:  By:  Answer:  Consent: discussion documented in EHR   10/09/23 2052           Code Status History     Date Active Date Inactive Code Status Order ID Comments User Context   09/13/2020 1701 09/15/2020 2039 Full Code [id number]  Heloise Purpura, MD Inpatient   06/11/2020 0015 06/13/2020 1951 Full Code [id number]  Marinda Elk, MD ED   05/11/2020 2230 06/01/2020 2050 Full Code [id number]  Cipriano Bunker, MD ED         IV Access:   Peripheral IV   Procedures and diagnostic studies:   CT ANGIO CHEST/ABD/PEL FOR DISSECTION W &/OR WO CONTRAST Result Date: 10/09/2023 CLINICAL DATA:  Near-syncope with pain and nausea. EXAM: CT ANGIOGRAPHY CHEST, ABDOMEN AND PELVIS TECHNIQUE: Non-contrast CT of the chest was initially obtained. Multidetector CT imaging through the chest, abdomen and pelvis was performed using the standard  protocol during bolus administration of intravenous contrast. Multiplanar reconstructed images and MIPs were obtained and reviewed to evaluate the vascular anatomy. RADIATION DOSE REDUCTION: This exam was performed according to the departmental dose-optimization program which includes automated exposure control, adjustment of the mA and/or kV according  to patient size and/or use of iterative reconstruction technique. CONTRAST:  OMNIPAQUE IOHEXOL 350 MG/ML SOLN COMPARISON:  Chest x-ray 10/09/2023 earlier. Abdomen and pelvis CT without contrast 05/11/2020. No prior chest CT. FINDINGS: CTA CHEST FINDINGS Cardiovascular: On the noncontrast dataset there is no curvilinear high density along the course of the thoracic aorta. Postcontrast images are late for the arterial phase. Appearance is more nephrographic phase or late portal venous phase limiting evaluation for subtle arterial abnormality. There is scattered atherosclerotic calcified plaque along the thoracic aorta, mild. No clear evidence of dissection or aneurysm formation. Diameter of the ascending aorta at the level of the right pulmonary artery approaches 3.3 cm. There is pulsation artifact. The descending thoracic aorta same level measures 2.8 by 2.7 cm. Diameter of the aortic root approaches 3.1 cm and distal aortic arch 3.0 cm. The origin of the great vessels are grossly preserved. Scattered calcified plaque identified along the great vessels proximally. The heart is nonenlarged. Trace pericardial fluid. There is a right IJ catheter in place with tip extending to the SVC right atrial junction region. Mediastinum/Nodes: Patulous esophagus. Small thyroid gland. No specific abnormal lymph node enlargement identified in the axillary regions, hilum or mediastinum. Lungs/Pleura: No consolidation, pneumothorax or effusion. Minimal areas of scarring atelectatic changes identified. Mild lower lung bronchiectasis on the right. There is a calcified nodule right lower lobe on series 6, image 104 consistent with old granulomatous disease. Slight basilar atelectasis or scar as well. Noncalcified right lower lobe lung nodule measures 7 mm on series 6, image 93. 7 mm left lower lobe lung nodule noncalcified series 6, image 77 as well. Musculoskeletal: Mild degenerative changes along the spine. Review of the MIP  images confirms the above findings. CTA ABDOMEN AND PELVIS FINDINGS VASCULAR. Contrast bolus is more in the late portal venous phase early nephrographic phase limiting evaluation for subtle vascular abnormality. Aorta: Mild atherosclerotic calcified plaque. No dissection or aneurysm formation. Celiac: Standard branching pattern of the celiac axis. No significant stenosis. Mild atherosclerotic plaque. SMA: Mild partially calcified atherosclerotic plaque proximally and along the midportion of the vessel. No significant stenosis centrally. Renals: Mild calcified plaque along the proximal aspects of both renal arteries. Single bilateral main renal arteries are identified. IMA: Patent without evidence of aneurysm, dissection, vasculitis or significant stenosis. Inflow: The iliac vessels are grossly preserved in caliber. Scattered calcified mild plaque identified. No significant stenosis. Veins: No obvious venous abnormality within the limitations of this arterial phase study. Review of the MIP images confirms the above findings. NON-VASCULAR Hepatobiliary: Stable low-attenuation dome lesion on series 10, image 118 measuring 14 mm. Not clearly a simple cyst. Gallbladder is nondilated. Patent portal vein. Pancreas: Mild pancreatic atrophy. There is small cystic area in the uncinate process with some dystrophic calcification measuring 13 mm. Second smaller focus towards the tail on series 10, image 150 measuring 7 mm. Third lesion in the body on coronal series 12, image 64. Recommend further evaluation. Spleen: Normal in size without focal abnormality. Adrenals/Urinary Tract: Stable nodular thickening of both adrenal glands. Moderate atrophy of the kidneys. No collecting system dilatation. The ureters have normal course and caliber extending down to the urinary bladder. Preserved contours of the urinary bladder. Bladder wall slightly  thickened and trabeculated. Bilateral Bosniak 1 renal cysts are identified,  left-greater-than-right. Some smaller Bosniak 2 lesions as well. No significant imaging follow-up of these renal lesions at this time. Stomach/Bowel: On this non oral contrast exam large bowel has a normal course and caliber with some scattered stool. Colonic diverticulosis seen particularly along the left side. Normal appendix extends medial to the cecum in the right lower quadrant. There is moderate fluid and debris along the stomach. Slight fold thickening suggested along the proximal duodenal such as series 10, image 168. There is some prominent adjacent lymph nodes as well. Please correlate with symptoms. Otherwise the small bowel is nondilated. Several loops distally are fluid-filled. Lymphatic: Few prominent lymph nodes in the central mesentery near the pancreas and duodenal. These not pathologic by size criteria. No abnormal lymph nodes in the abdomen and pelvis. Reproductive: Prominent prostate. Mass effect along the base of the bladder. Please correlate for any history of the TURP. Other: Mild anasarca. Musculoskeletal: Mild degenerative changes along the spine and pelvis. Trace anterolisthesis of L4 on L5. Multilevel lumbar spine mild disc bulging. Review of the MIP images confirms the above findings. IMPRESSION: Evaluation limited due to the timing of the contrast bolus, more late portal venous phase. Scattered calcified atherosclerotic plaque diffusely without clear evidence of dissection or aneurysm formation. Bilateral renal atrophy.  Benign appearing renal cystic foci. Colonic diverticulosis.  No bowel obstruction. There is slight wall thickening along the proximal duodenal with some small adjacent nodes which are not pathologic by size criteria but more numerous than usually seen. Please correlate with any symptoms and further workup of the duodenal when clinically appropriate Small cystic areas along the pancreas. Based on size and appearance please correlate with any prior to assess for long-term  stability. However these are not seen in 2021 noncontrast CT. Otherwise recommend dedicated MRI evaluation when clinically appropriate. Noncalcified bilateral lung nodules measuring up to 7 mm, 1 on each side. There are some separate calcified foci. Please correlate with any prior to assess for stability otherwise Non-contrast chest CT at 3-6 months is recommended. If the nodules are stable at time of repeat CT, then future CT at 18-24 months (from today's scan) is considered optional for low-risk patients, but is recommended for high-risk patients. This recommendation follows the consensus statement: Guidelines for Management of Incidental Pulmonary Nodules Detected on CT Images: From the Fleischner Society 2017; Radiology 2017; 284:228-243. Distended urinary bladder with an enlarged prostate. Bladder wall thickening and trabeculation. Electronically Signed   By: Karen Kays M.D.   On: 10/09/2023 18:56   DG Chest 2 View Result Date: 10/09/2023 CLINICAL DATA:  Suspected sepsis. Abdominal pain. Generalized weakness. EXAM: CHEST - 2 VIEW COMPARISON:  09/19/2023. FINDINGS: Low lung volume. Bilateral lung fields are clear. Bilateral costophrenic angles are clear. Normal cardio-mediastinal silhouette. No acute osseous abnormalities. The soft tissues are within normal limits. Right IJ hemodialysis catheter noted with its tip overlying the lower portion of superior vena cava. IMPRESSION: No active cardiopulmonary disease. Electronically Signed   By: Jules Schick M.D.   On: 10/09/2023 12:44     Medical Consultants:   None.   Subjective:    Yvette Evelyn relates his breathing is not improved.  Objective:    Vitals:   10/10/23 0453 10/10/23 0629 10/10/23 0750 10/10/23 0832  BP: (!) 105/57 109/66 (!) 93/50 110/62  Pulse: 98 94 (!) 104 91  Resp: 18 17 20 18   Temp: 97.9 F (36.6 C) (!) 97.5 F (36.4 C) (!)  97.3 F (36.3 C) (!) 97.5 F (36.4 C)  TempSrc: Oral Oral Oral Oral  SpO2: 100% 99% 100%  100%  Weight:      Height:       SpO2: 100 % O2 Flow Rate (L/min): 3 L/min  No intake or output data in the 24 hours ending 10/10/23 1017 Filed Weights   10/09/23 1208  Weight: 88.5 kg    Exam: General exam: In no acute distress. Respiratory system: Good air movement and minimal crackles on the right lower lobe Cardiovascular system: S1 & S2 heard, RRR.  Positive JVD Gastrointestinal system: Abdomen is nondistended, soft and nontender.  Extremities: No pedal edema. Skin: No rashes, lesions or ulcers Psychiatry: Judgement and insight appear normal. Mood & affect appropriate.    Data Reviewed:    Labs: Basic Metabolic Panel: Recent Labs  Lab 10/09/23 1323 10/10/23 0529  NA 138 131*  K 4.4 3.6  CL 101 101  CO2 22 16*  GLUCOSE 97 127*  BUN 99* 119*  CREATININE 7.64* 7.90*  CALCIUM 10.0 8.8*   GFR Estimated Creatinine Clearance: 9.7 mL/min (A) (by C-G formula based on SCr of 7.9 mg/dL (H)). Liver Function Tests: Recent Labs  Lab 10/09/23 1323  AST 15  ALT 14  ALKPHOS 62  BILITOT 0.4  PROT 6.4*  ALBUMIN 3.4*   No results for input(s): "LIPASE", "AMYLASE" in the last 168 hours. No results for input(s): "AMMONIA" in the last 168 hours. Coagulation profile Recent Labs  Lab 10/09/23 1323  INR 0.9   COVID-19 Labs  Recent Labs    10/10/23 0708  FERRITIN 706*    Lab Results  Component Value Date   SARSCOV2NAA NEGATIVE 09/10/2020   SARSCOV2NAA NEGATIVE 06/10/2020   SARSCOV2NAA NEGATIVE 05/31/2020   SARSCOV2NAA NEGATIVE 05/11/2020    CBC: Recent Labs  Lab 10/09/23 1323 10/10/23 0529  WBC 10.2 10.5  NEUTROABS 7.7  --   HGB 8.2* 6.5*  HCT 24.6* 19.7*  MCV 91.8 89.5  PLT 277 218   Cardiac Enzymes: No results for input(s): "CKTOTAL", "CKMB", "CKMBINDEX", "TROPONINI" in the last 168 hours. BNP (last 3 results) No results for input(s): "PROBNP" in the last 8760 hours. CBG: Recent Labs  Lab 10/09/23 1206  GLUCAP 138*   D-Dimer: No results  for input(s): "DDIMER" in the last 72 hours. Hgb A1c: No results for input(s): "HGBA1C" in the last 72 hours. Lipid Profile: No results for input(s): "CHOL", "HDL", "LDLCALC", "TRIG", "CHOLHDL", "LDLDIRECT" in the last 72 hours. Thyroid function studies: No results for input(s): "TSH", "T4TOTAL", "T3FREE", "THYROIDAB" in the last 72 hours.  Invalid input(s): "FREET3" Anemia work up: Recent Labs    10/10/23 0708  VITAMINB12 241  FOLATE 12.7  FERRITIN 706*  TIBC 204*  IRON 106  RETICCTPCT 3.7*   Sepsis Labs: Recent Labs  Lab 10/09/23 1323 10/09/23 1332 10/10/23 0529  WBC 10.2  --  10.5  LATICACIDVEN  --  2.7*  --    Microbiology No results found for this or any previous visit (from the past 240 hours).   Medications:    sodium chloride   Intravenous Once   Chlorhexidine Gluconate Cloth  6 each Topical Daily   heparin  5,000 Units Subcutaneous Q8H   sertraline  200 mg Oral Daily   Continuous Infusions:    LOS: 0 days   Marinda Elk  [facility]  10/10/2023, 10:17 AM

## 2023-10-10 NOTE — Progress Notes (Addendum)
Subjective:   72 year old male with ESRD HD TTS at [facility], history of noncompliance, last HD was 12/23, other med problems include prostate cancer s/p TURP, HTN, hyperparathyroidism, anemia of chronic disease, GERD, pancreatic insufficiency . Admitted to observation after presenting the ER complaining shortness of breath,,loss of appetite , near syncope episode at home.  In ER tachycardic heart rate 122 tachypneic 23 normotensive on room air afebrile, BUN 99, creatinine 7.96 K4.4, CO2 22, hemoglobin 8.2 and noted a drop 6.5 today, TSAT 52, CT angio chest abdomen pelvis showed slight thickening of duodenum, numerous small nonpathological lymph nodes small pancreatic cyst.  No volume excess and chest some pulmonary nodes up to 7 mm needs follow-up in 3 to 6 months.  Seen in room not in distress feels better with nasal cannula oxygen, did eat some lunch is requesting 3 3 L or more UF on HD.  States he will stay his complete 4 hours as outpatient.  States he knows he should do better that his current under dialysis time.  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  Plan for hemodialysis today 4-hour treatment 3 to 3.5 L UF attempt.  He is aware of outpatient schedule next HD Monday on his TTS schedule  Plan for full consult note if admitted from observation status  Lenny Pastel, PA-C [facility] [phone] 10/10/2023,12:32 PM  LOS: 0 days   Labs: Basic Metabolic Panel: Recent Labs  Lab 10/09/23 1323 10/10/23 0529  NA 138 131*  K 4.4 3.6  CL 101 101  CO2 22 16*  GLUCOSE 97 127*  BUN 99* 119*  CREATININE 7.64* 7.90*  CALCIUM 10.0 8.8*   Liver Function Tests: Recent Labs  Lab 10/09/23 1323  AST 15  ALT 14  ALKPHOS 62  BILITOT 0.4  PROT 6.4*  ALBUMIN 3.4*   No results for input(s): "LIPASE", "AMYLASE" in the last 168 hours. No results for input(s): "AMMONIA" in the last 168  hours. CBC: Recent Labs  Lab 10/09/23 1323 10/10/23 0529  WBC 10.2 10.5  NEUTROABS 7.7  --   HGB 8.2* 6.5*  HCT 24.6* 19.7*  MCV 91.8 89.5  PLT 277 218   Cardiac Enzymes: No results for input(s): "CKTOTAL", "CKMB", "CKMBINDEX", "TROPONINI" in the last 168 hours. CBG: Recent Labs  Lab 10/09/23 1206  GLUCAP 138*    Studies/Results: CT ANGIO CHEST/ABD/PEL FOR DISSECTION W &/OR WO CONTRAST Result Date: 10/09/2023 CLINICAL DATA:  Near-syncope with pain and nausea. EXAM: CT ANGIOGRAPHY CHEST, ABDOMEN AND PELVIS TECHNIQUE: Non-contrast CT of the chest was initially obtained. Multidetector CT imaging through the chest, abdomen and pelvis was performed using the standard protocol during bolus administration of intravenous contrast. Multiplanar reconstructed images and MIPs were obtained and reviewed to evaluate the vascular anatomy. RADIATION DOSE REDUCTION: This exam was performed according to the departmental dose-optimization program which includes automated exposure control, adjustment of the mA and/or kV according to patient size and/or use of iterative reconstruction technique. CONTRAST:  OMNIPAQUE IOHEXOL 350 MG/ML SOLN COMPARISON:  Chest x-ray 10/09/2023 earlier. Abdomen and pelvis CT without contrast 05/11/2020. No prior chest CT. FINDINGS: CTA CHEST FINDINGS Cardiovascular: On the noncontrast dataset there is no curvilinear high density along the course of the thoracic aorta. Postcontrast images are late for the arterial phase. Appearance is more nephrographic phase or late portal venous phase limiting evaluation for subtle arterial abnormality. There is scattered atherosclerotic calcified plaque along the  thoracic aorta, mild. No clear evidence of dissection or aneurysm formation. Diameter of the ascending aorta at the level of the right pulmonary artery approaches 3.3 cm. There is pulsation artifact. The descending thoracic aorta same level measures 2.8 by 2.7 cm. Diameter of the  aortic root approaches 3.1 cm and distal aortic arch 3.0 cm. The origin of the great vessels are grossly preserved. Scattered calcified plaque identified along the great vessels proximally. The heart is nonenlarged. Trace pericardial fluid. There is a right IJ catheter in place with tip extending to the SVC right atrial junction region. Mediastinum/Nodes: Patulous esophagus. Small thyroid gland. No specific abnormal lymph node enlargement identified in the axillary regions, hilum or mediastinum. Lungs/Pleura: No consolidation, pneumothorax or effusion. Minimal areas of scarring atelectatic changes identified. Mild lower lung bronchiectasis on the right. There is a calcified nodule right lower lobe on series 6, image 104 consistent with old granulomatous disease. Slight basilar atelectasis or scar as well. Noncalcified right lower lobe lung nodule measures 7 mm on series 6, image 93. 7 mm left lower lobe lung nodule noncalcified series 6, image 77 as well. Musculoskeletal: Mild degenerative changes along the spine. Review of the MIP images confirms the above findings. CTA ABDOMEN AND PELVIS FINDINGS VASCULAR. Contrast bolus is more in the late portal venous phase early nephrographic phase limiting evaluation for subtle vascular abnormality. Aorta: Mild atherosclerotic calcified plaque. No dissection or aneurysm formation. Celiac: Standard branching pattern of the celiac axis. No significant stenosis. Mild atherosclerotic plaque. SMA: Mild partially calcified atherosclerotic plaque proximally and along the midportion of the vessel. No significant stenosis centrally. Renals: Mild calcified plaque along the proximal aspects of both renal arteries. Single bilateral main renal arteries are identified. IMA: Patent without evidence of aneurysm, dissection, vasculitis or significant stenosis. Inflow: The iliac vessels are grossly preserved in caliber. Scattered calcified mild plaque identified. No significant stenosis.  Veins: No obvious venous abnormality within the limitations of this arterial phase study. Review of the MIP images confirms the above findings. NON-VASCULAR Hepatobiliary: Stable low-attenuation dome lesion on series 10, image 118 measuring 14 mm. Not clearly a simple cyst. Gallbladder is nondilated. Patent portal vein. Pancreas: Mild pancreatic atrophy. There is small cystic area in the uncinate process with some dystrophic calcification measuring 13 mm. Second smaller focus towards the tail on series 10, image 150 measuring 7 mm. Third lesion in the body on coronal series 12, image 64. Recommend further evaluation. Spleen: Normal in size without focal abnormality. Adrenals/Urinary Tract: Stable nodular thickening of both adrenal glands. Moderate atrophy of the kidneys. No collecting system dilatation. The ureters have normal course and caliber extending down to the urinary bladder. Preserved contours of the urinary bladder. Bladder wall slightly thickened and trabeculated. Bilateral Bosniak 1 renal cysts are identified, left-greater-than-right. Some smaller Bosniak 2 lesions as well. No significant imaging follow-up of these renal lesions at this time. Stomach/Bowel: On this non oral contrast exam large bowel has a normal course and caliber with some scattered stool. Colonic diverticulosis seen particularly along the left side. Normal appendix extends medial to the cecum in the right lower quadrant. There is moderate fluid and debris along the stomach. Slight fold thickening suggested along the proximal duodenal such as series 10, image 168. There is some prominent adjacent lymph nodes as well. Please correlate with symptoms. Otherwise the small bowel is nondilated. Several loops distally are fluid-filled. Lymphatic: Few prominent lymph nodes in the central mesentery near the pancreas and duodenal. These not pathologic by size  criteria. No abnormal lymph nodes in the abdomen and pelvis. Reproductive: Prominent  prostate. Mass effect along the base of the bladder. Please correlate for any history of the TURP. Other: Mild anasarca. Musculoskeletal: Mild degenerative changes along the spine and pelvis. Trace anterolisthesis of L4 on L5. Multilevel lumbar spine mild disc bulging. Review of the MIP images confirms the above findings. IMPRESSION: Evaluation limited due to the timing of the contrast bolus, more late portal venous phase. Scattered calcified atherosclerotic plaque diffusely without clear evidence of dissection or aneurysm formation. Bilateral renal atrophy.  Benign appearing renal cystic foci. Colonic diverticulosis.  No bowel obstruction. There is slight wall thickening along the proximal duodenal with some small adjacent nodes which are not pathologic by size criteria but more numerous than usually seen. Please correlate with any symptoms and further workup of the duodenal when clinically appropriate Small cystic areas along the pancreas. Based on size and appearance please correlate with any prior to assess for long-term stability. However these are not seen in 2021 noncontrast CT. Otherwise recommend dedicated MRI evaluation when clinically appropriate. Noncalcified bilateral lung nodules measuring up to 7 mm, 1 on each side. There are some separate calcified foci. Please correlate with any prior to assess for stability otherwise Non-contrast chest CT at 3-6 months is recommended. If the nodules are stable at time of repeat CT, then future CT at 18-24 months (from today's scan) is considered optional for low-risk patients, but is recommended for high-risk patients. This recommendation follows the consensus statement: Guidelines for Management of Incidental Pulmonary Nodules Detected on CT Images: From the Fleischner Society 2017; Radiology 2017; 284:228-243. Distended urinary bladder with an enlarged prostate. Bladder wall thickening and trabeculation. Electronically Signed   By: Karen Kays M.D.   On:  10/09/2023 18:56   DG Chest 2 View Result Date: 10/09/2023 CLINICAL DATA:  Suspected sepsis. Abdominal pain. Generalized weakness. EXAM: CHEST - 2 VIEW COMPARISON:  09/19/2023. FINDINGS: Low lung volume. Bilateral lung fields are clear. Bilateral costophrenic angles are clear. Normal cardio-mediastinal silhouette. No acute osseous abnormalities. The soft tissues are within normal limits. Right IJ hemodialysis catheter noted with its tip overlying the lower portion of superior vena cava. IMPRESSION: No active cardiopulmonary disease. Electronically Signed   By: Jules Schick M.D.   On: 10/09/2023 12:44   Medications:   sodium chloride   Intravenous Once   amLODipine  10 mg Oral Daily   atorvastatin  10 mg Oral Daily   calcium carbonate  1 tablet Oral BID   Chlorhexidine Gluconate Cloth  6 each Topical Daily   divalproex  125 mg Oral Q12H   docusate sodium  100 mg Oral BID   FLUoxetine  40 mg Oral Daily   latanoprost  1 drop Right Eye QHS   lipase/protease/amylase  72,000 Units Oral TID with meals   magnesium oxide  400 mg Oral Daily   [START ON 10/11/2023] melatonin  5 mg Oral QHS   QUEtiapine  200 mg Oral QHS   sertraline  200 mg Oral Daily   tamsulosin  0.8 mg Oral QPC supper   timolol  1 drop Right Eye BID

## 2023-10-10 NOTE — Procedures (Signed)
Patient's nurse called dialysis unit to see what time the patient would be coming to the unit for treatment and assess if the blood ordered this morning could be given during dialysis.  This nurse advised that there is no set time for the patient to come to the unit, but would definitely be after 1900.  This nurse advised that the blood be given as waiting for the treatment could be hours from this time.

## 2023-10-10 NOTE — Progress Notes (Signed)
Patient in dialysis.

## 2023-10-10 NOTE — Plan of Care (Signed)

## 2023-10-11 DIAGNOSIS — D62 Acute posthemorrhagic anemia: Secondary | ICD-10-CM | POA: Diagnosis present

## 2023-10-11 DIAGNOSIS — R630 Anorexia: Secondary | ICD-10-CM | POA: Diagnosis not present

## 2023-10-11 DIAGNOSIS — R1084 Generalized abdominal pain: Secondary | ICD-10-CM | POA: Diagnosis not present

## 2023-10-11 DIAGNOSIS — I120 Hypertensive chronic kidney disease with stage 5 chronic kidney disease or end stage renal disease: Secondary | ICD-10-CM | POA: Diagnosis present

## 2023-10-11 DIAGNOSIS — F419 Anxiety disorder, unspecified: Secondary | ICD-10-CM | POA: Diagnosis present

## 2023-10-11 DIAGNOSIS — F32A Depression, unspecified: Secondary | ICD-10-CM | POA: Diagnosis present

## 2023-10-11 DIAGNOSIS — R55 Syncope and collapse: Secondary | ICD-10-CM | POA: Diagnosis present

## 2023-10-11 DIAGNOSIS — R339 Retention of urine, unspecified: Secondary | ICD-10-CM | POA: Diagnosis not present

## 2023-10-11 DIAGNOSIS — Z9079 Acquired absence of other genital organ(s): Secondary | ICD-10-CM | POA: Diagnosis not present

## 2023-10-11 DIAGNOSIS — K269 Duodenal ulcer, unspecified as acute or chronic, without hemorrhage or perforation: Secondary | ICD-10-CM | POA: Diagnosis not present

## 2023-10-11 DIAGNOSIS — E872 Acidosis, unspecified: Secondary | ICD-10-CM | POA: Diagnosis present

## 2023-10-11 DIAGNOSIS — K2950 Unspecified chronic gastritis without bleeding: Secondary | ICD-10-CM | POA: Diagnosis not present

## 2023-10-11 DIAGNOSIS — E8770 Fluid overload, unspecified: Secondary | ICD-10-CM | POA: Diagnosis present

## 2023-10-11 DIAGNOSIS — Z8546 Personal history of malignant neoplasm of prostate: Secondary | ICD-10-CM | POA: Diagnosis not present

## 2023-10-11 DIAGNOSIS — R112 Nausea with vomiting, unspecified: Secondary | ICD-10-CM | POA: Diagnosis not present

## 2023-10-11 DIAGNOSIS — D631 Anemia in chronic kidney disease: Secondary | ICD-10-CM | POA: Diagnosis present

## 2023-10-11 DIAGNOSIS — K449 Diaphragmatic hernia without obstruction or gangrene: Secondary | ICD-10-CM | POA: Diagnosis present

## 2023-10-11 DIAGNOSIS — K921 Melena: Secondary | ICD-10-CM | POA: Diagnosis not present

## 2023-10-11 DIAGNOSIS — K863 Pseudocyst of pancreas: Secondary | ICD-10-CM | POA: Diagnosis present

## 2023-10-11 DIAGNOSIS — N2581 Secondary hyperparathyroidism of renal origin: Secondary | ICD-10-CM | POA: Diagnosis present

## 2023-10-11 DIAGNOSIS — K862 Cyst of pancreas: Secondary | ICD-10-CM | POA: Diagnosis present

## 2023-10-11 DIAGNOSIS — N189 Chronic kidney disease, unspecified: Secondary | ICD-10-CM | POA: Diagnosis not present

## 2023-10-11 DIAGNOSIS — Z5986 Financial insecurity: Secondary | ICD-10-CM | POA: Diagnosis not present

## 2023-10-11 DIAGNOSIS — N186 End stage renal disease: Secondary | ICD-10-CM | POA: Diagnosis present

## 2023-10-11 DIAGNOSIS — K529 Noninfective gastroenteritis and colitis, unspecified: Secondary | ICD-10-CM | POA: Diagnosis present

## 2023-10-11 DIAGNOSIS — Z992 Dependence on renal dialysis: Secondary | ICD-10-CM | POA: Diagnosis not present

## 2023-10-11 DIAGNOSIS — K2980 Duodenitis without bleeding: Secondary | ICD-10-CM | POA: Diagnosis present

## 2023-10-11 DIAGNOSIS — K861 Other chronic pancreatitis: Secondary | ICD-10-CM | POA: Diagnosis present

## 2023-10-11 DIAGNOSIS — D649 Anemia, unspecified: Secondary | ICD-10-CM | POA: Diagnosis not present

## 2023-10-11 DIAGNOSIS — E8779 Other fluid overload: Secondary | ICD-10-CM | POA: Diagnosis not present

## 2023-10-11 DIAGNOSIS — K264 Chronic or unspecified duodenal ulcer with hemorrhage: Secondary | ICD-10-CM | POA: Diagnosis present

## 2023-10-11 DIAGNOSIS — K317 Polyp of stomach and duodenum: Secondary | ICD-10-CM | POA: Diagnosis present

## 2023-10-11 DIAGNOSIS — R338 Other retention of urine: Secondary | ICD-10-CM | POA: Diagnosis present

## 2023-10-11 DIAGNOSIS — Z91158 Patient's noncompliance with renal dialysis for other reason: Secondary | ICD-10-CM | POA: Diagnosis not present

## 2023-10-11 DIAGNOSIS — R911 Solitary pulmonary nodule: Secondary | ICD-10-CM | POA: Diagnosis present

## 2023-10-11 DIAGNOSIS — R0602 Shortness of breath: Secondary | ICD-10-CM | POA: Diagnosis present

## 2023-10-11 LAB — CBC
HCT: 20.4 % — ABNORMAL LOW (ref 39.0–52.0)
Hemoglobin: 7 g/dL — ABNORMAL LOW (ref 13.0–17.0)
MCH: 29.9 pg (ref 26.0–34.0)
MCHC: 34.3 g/dL (ref 30.0–36.0)
MCV: 87.2 fL (ref 80.0–100.0)
Platelets: 187 10*3/uL (ref 150–400)
RBC: 2.34 MIL/uL — ABNORMAL LOW (ref 4.22–5.81)
RDW: 17.9 % — ABNORMAL HIGH (ref 11.5–15.5)
WBC: 12.1 10*3/uL — ABNORMAL HIGH (ref 4.0–10.5)
nRBC: 0 % (ref 0.0–0.2)

## 2023-10-11 LAB — RENAL FUNCTION PANEL
Albumin: 3.6 g/dL (ref 3.5–5.0)
Anion gap: 12 (ref 5–15)
BUN: 54 mg/dL — ABNORMAL HIGH (ref 8–23)
CO2: 25 mmol/L (ref 22–32)
Calcium: 9 mg/dL (ref 8.9–10.3)
Chloride: 96 mmol/L — ABNORMAL LOW (ref 98–111)
Creatinine, Ser: 4.75 mg/dL — ABNORMAL HIGH (ref 0.61–1.24)
GFR, Estimated: 12 mL/min — ABNORMAL LOW (ref >60)
Glucose, Bld: 111 mg/dL — ABNORMAL HIGH (ref 70–99)
Phosphorus: 2.1 mg/dL — ABNORMAL LOW (ref 2.5–4.6)
Potassium: 3.4 mmol/L — ABNORMAL LOW (ref 3.5–5.1)
Sodium: 133 mmol/L — ABNORMAL LOW (ref 135–145)

## 2023-10-11 MED ORDER — CALCITRIOL 0.5 MCG PO CAPS
1.2500 ug | ORAL_CAPSULE | ORAL | Status: DC
  Administered 2023-10-12 – 2023-10-14 (×2): 1.25 ug via ORAL
  Filled 2023-10-11 (×3): qty 1

## 2023-10-11 MED ORDER — CHLORHEXIDINE GLUCONATE CLOTH 2 % EX PADS
6.0000 | MEDICATED_PAD | Freq: Every day | CUTANEOUS | Status: DC
  Administered 2023-10-12 – 2023-10-15 (×4): 6 via TOPICAL

## 2023-10-11 MED ORDER — CINACALCET HCL 30 MG PO TABS
30.0000 mg | ORAL_TABLET | ORAL | Status: DC
  Administered 2023-10-12 – 2023-10-14 (×2): 30 mg via ORAL
  Filled 2023-10-11 (×2): qty 1

## 2023-10-11 MED ORDER — IRON SUCROSE 200 MG IVPB - SIMPLE MED
200.0000 mg | Status: DC
  Administered 2023-10-13: 200 mg via INTRAVENOUS
  Filled 2023-10-11: qty 110
  Filled 2023-10-11: qty 200

## 2023-10-11 MED ORDER — HEPARIN SODIUM (PORCINE) 1000 UNIT/ML IJ SOLN
INTRAMUSCULAR | Status: AC
  Filled 2023-10-11: qty 1

## 2023-10-11 MED ORDER — DARBEPOETIN ALFA 200 MCG/0.4ML IJ SOSY
200.0000 ug | PREFILLED_SYRINGE | Freq: Once | INTRAMUSCULAR | Status: AC
  Administered 2023-10-11: 200 ug via SUBCUTANEOUS
  Filled 2023-10-11: qty 0.4

## 2023-10-11 NOTE — Plan of Care (Signed)
  Problem: Elimination: Goal: Will not experience complications related to bowel motility Outcome: Progressing   Problem: Pain Management: Goal: General experience of comfort will improve Outcome: Progressing

## 2023-10-11 NOTE — Plan of Care (Signed)

## 2023-10-11 NOTE — Progress Notes (Signed)
[facility] PROGRESS NOTE    Progress Note  Manuel Ortega  ZOX:[id number] DOB: 09/02/1951 DOA: 10/09/2023 PCP: Bryon Lions, PA-C     Brief Narrative:   Manuel Ortega is an 72 y.o. male past medical history of end-stage renal disease on hemodialysis Tuesday Thursdays and Saturdays, prostate cancer status post TURP, hyperparathyroidism, anemia of chronic disease, pancreatic insufficiency BPH who comes in for a near syncopal episode he relates decreased appetite, he relates he got up and started walking and felt short of breath the ED was found tachycardic tachypneic and satting greater 90% on room air. He has missed 2 of his previous dialysis afebrile with no leukocytosis.  CT angio of the chest abdomen and pelvis showed slight thickening of the proximal duodenum, numerous small nonpathologic lymph nodes, small cyst in the pancreas.  Assessment/Plan:   Pre-syncope/volume overload: Due to noncompliance with his dialysis.  He has missed his last 2 sessions, and he relates he has cut short his last 4- 5 sessions before that. On admission orthostatics were  were positive.   He is very anxious patient His workup for infectious etiology is unremarkable. CT showed no acute findings. 2D echo this year showed an EF of 60%. Been weaned to room air.  Essential hypertension Normotensive, no antihypertensive medications at home.  Incidental pancreatic pseudocyst/pancreatic insufficiency: Asymptomatic except for nausea and vomiting possibly due to missed dialysis. Resume Creon.  Anxiety: Probably contributing to his part of his symptoms. Restart his sertraline.  Incidental pulmonary nodule: Measuring up to 7 mm will need a follow-up CT of the chest in 3 to 6 months.  History of prostate cancer: Status post TURP: Noted.  Incisions renal disease on hemodialysis Tuesday Thursdays and Saturdays: He has missed 2 of his hemodialysis sessions this week. Will need dialysis  nonurgent.  Acute on chronic kidney disease: Drop in hemoglobin to 6.5. Status post 2 units of packed red blood cells hemoglobin this morning is pending.  DVT prophylaxis: lovenox Family Communication:none Status is: Observation The patient remains OBS appropriate and will d/c before 2 midnights.    Code Status:     Code Status Orders  (From admission, onward)           Start     Ordered   10/09/23 2053  Full code  Continuous       Question:  By:  Answer:  Consent: discussion documented in EHR   10/09/23 2052           Code Status History     Date Active Date Inactive Code Status Order ID Comments User Context   09/13/2020 1701 09/15/2020 2039 Full Code [id number]  Heloise Purpura, MD Inpatient   06/11/2020 0015 06/13/2020 1951 Full Code [id number]  Marinda Elk, MD ED   05/11/2020 2230 06/01/2020 2050 Full Code [id number]  Cipriano Bunker, MD ED         IV Access:   Peripheral IV   Procedures and diagnostic studies:   CT ANGIO CHEST/ABD/PEL FOR DISSECTION W &/OR WO CONTRAST Result Date: 10/09/2023 CLINICAL DATA:  Near-syncope with pain and nausea. EXAM: CT ANGIOGRAPHY CHEST, ABDOMEN AND PELVIS TECHNIQUE: Non-contrast CT of the chest was initially obtained. Multidetector CT imaging through the chest, abdomen and pelvis was performed using the standard protocol during bolus administration of intravenous contrast. Multiplanar reconstructed images and MIPs were obtained and reviewed to evaluate the vascular anatomy. RADIATION DOSE REDUCTION: This exam was performed according to the departmental dose-optimization program which includes automated exposure control, adjustment  of the mA and/or kV according to patient size and/or use of iterative reconstruction technique. CONTRAST:  OMNIPAQUE IOHEXOL 350 MG/ML SOLN COMPARISON:  Chest x-ray 10/09/2023 earlier. Abdomen and pelvis CT without contrast 05/11/2020. No prior chest CT. FINDINGS: CTA CHEST FINDINGS  Cardiovascular: On the noncontrast dataset there is no curvilinear high density along the course of the thoracic aorta. Postcontrast images are late for the arterial phase. Appearance is more nephrographic phase or late portal venous phase limiting evaluation for subtle arterial abnormality. There is scattered atherosclerotic calcified plaque along the thoracic aorta, mild. No clear evidence of dissection or aneurysm formation. Diameter of the ascending aorta at the level of the right pulmonary artery approaches 3.3 cm. There is pulsation artifact. The descending thoracic aorta same level measures 2.8 by 2.7 cm. Diameter of the aortic root approaches 3.1 cm and distal aortic arch 3.0 cm. The origin of the great vessels are grossly preserved. Scattered calcified plaque identified along the great vessels proximally. The heart is nonenlarged. Trace pericardial fluid. There is a right IJ catheter in place with tip extending to the SVC right atrial junction region. Mediastinum/Nodes: Patulous esophagus. Small thyroid gland. No specific abnormal lymph node enlargement identified in the axillary regions, hilum or mediastinum. Lungs/Pleura: No consolidation, pneumothorax or effusion. Minimal areas of scarring atelectatic changes identified. Mild lower lung bronchiectasis on the right. There is a calcified nodule right lower lobe on series 6, image 104 consistent with old granulomatous disease. Slight basilar atelectasis or scar as well. Noncalcified right lower lobe lung nodule measures 7 mm on series 6, image 93. 7 mm left lower lobe lung nodule noncalcified series 6, image 77 as well. Musculoskeletal: Mild degenerative changes along the spine. Review of the MIP images confirms the above findings. CTA ABDOMEN AND PELVIS FINDINGS VASCULAR. Contrast bolus is more in the late portal venous phase early nephrographic phase limiting evaluation for subtle vascular abnormality. Aorta: Mild atherosclerotic calcified plaque. No  dissection or aneurysm formation. Celiac: Standard branching pattern of the celiac axis. No significant stenosis. Mild atherosclerotic plaque. SMA: Mild partially calcified atherosclerotic plaque proximally and along the midportion of the vessel. No significant stenosis centrally. Renals: Mild calcified plaque along the proximal aspects of both renal arteries. Single bilateral main renal arteries are identified. IMA: Patent without evidence of aneurysm, dissection, vasculitis or significant stenosis. Inflow: The iliac vessels are grossly preserved in caliber. Scattered calcified mild plaque identified. No significant stenosis. Veins: No obvious venous abnormality within the limitations of this arterial phase study. Review of the MIP images confirms the above findings. NON-VASCULAR Hepatobiliary: Stable low-attenuation dome lesion on series 10, image 118 measuring 14 mm. Not clearly a simple cyst. Gallbladder is nondilated. Patent portal vein. Pancreas: Mild pancreatic atrophy. There is small cystic area in the uncinate process with some dystrophic calcification measuring 13 mm. Second smaller focus towards the tail on series 10, image 150 measuring 7 mm. Third lesion in the body on coronal series 12, image 64. Recommend further evaluation. Spleen: Normal in size without focal abnormality. Adrenals/Urinary Tract: Stable nodular thickening of both adrenal glands. Moderate atrophy of the kidneys. No collecting system dilatation. The ureters have normal course and caliber extending down to the urinary bladder. Preserved contours of the urinary bladder. Bladder wall slightly thickened and trabeculated. Bilateral Bosniak 1 renal cysts are identified, left-greater-than-right. Some smaller Bosniak 2 lesions as well. No significant imaging follow-up of these renal lesions at this time. Stomach/Bowel: On this non oral contrast exam large bowel has a  normal course and caliber with some scattered stool. Colonic diverticulosis  seen particularly along the left side. Normal appendix extends medial to the cecum in the right lower quadrant. There is moderate fluid and debris along the stomach. Slight fold thickening suggested along the proximal duodenal such as series 10, image 168. There is some prominent adjacent lymph nodes as well. Please correlate with symptoms. Otherwise the small bowel is nondilated. Several loops distally are fluid-filled. Lymphatic: Few prominent lymph nodes in the central mesentery near the pancreas and duodenal. These not pathologic by size criteria. No abnormal lymph nodes in the abdomen and pelvis. Reproductive: Prominent prostate. Mass effect along the base of the bladder. Please correlate for any history of the TURP. Other: Mild anasarca. Musculoskeletal: Mild degenerative changes along the spine and pelvis. Trace anterolisthesis of L4 on L5. Multilevel lumbar spine mild disc bulging. Review of the MIP images confirms the above findings. IMPRESSION: Evaluation limited due to the timing of the contrast bolus, more late portal venous phase. Scattered calcified atherosclerotic plaque diffusely without clear evidence of dissection or aneurysm formation. Bilateral renal atrophy.  Benign appearing renal cystic foci. Colonic diverticulosis.  No bowel obstruction. There is slight wall thickening along the proximal duodenal with some small adjacent nodes which are not pathologic by size criteria but more numerous than usually seen. Please correlate with any symptoms and further workup of the duodenal when clinically appropriate Small cystic areas along the pancreas. Based on size and appearance please correlate with any prior to assess for long-term stability. However these are not seen in 2021 noncontrast CT. Otherwise recommend dedicated MRI evaluation when clinically appropriate. Noncalcified bilateral lung nodules measuring up to 7 mm, 1 on each side. There are some separate calcified foci. Please correlate with any  prior to assess for stability otherwise Non-contrast chest CT at 3-6 months is recommended. If the nodules are stable at time of repeat CT, then future CT at 18-24 months (from today's scan) is considered optional for low-risk patients, but is recommended for high-risk patients. This recommendation follows the consensus statement: Guidelines for Management of Incidental Pulmonary Nodules Detected on CT Images: From the Fleischner Society 2017; Radiology 2017; 284:228-243. Distended urinary bladder with an enlarged prostate. Bladder wall thickening and trabeculation. Electronically Signed   By: Karen Kays M.D.   On: 10/09/2023 18:56   DG Chest 2 View Result Date: 10/09/2023 CLINICAL DATA:  Suspected sepsis. Abdominal pain. Generalized weakness. EXAM: CHEST - 2 VIEW COMPARISON:  09/19/2023. FINDINGS: Low lung volume. Bilateral lung fields are clear. Bilateral costophrenic angles are clear. Normal cardio-mediastinal silhouette. No acute osseous abnormalities. The soft tissues are within normal limits. Right IJ hemodialysis catheter noted with its tip overlying the lower portion of superior vena cava. IMPRESSION: No active cardiopulmonary disease. Electronically Signed   By: Jules Schick M.D.   On: 10/09/2023 12:44     Medical Consultants:   None.   Subjective:    Manuel Ortega relates her breathing is improved this morning.  Objective:    Vitals:   10/11/23 0300 10/11/23 0325 10/11/23 0505 10/11/23 0808  BP: 95/60 (!) 100/56 109/63 114/62  Pulse: (!) 101 96 91 92  Resp: (!) 21 18 18 18   Temp:  98.2 F (36.8 C) 98.8 F (37.1 C)   TempSrc:  Oral Oral   SpO2: 100% 100% 100% 100%  Weight:      Height:       SpO2: 100 % O2 Flow Rate (L/min): 2 L/min   Intake/Output  Summary (Last 24 hours) at 10/11/2023 1022 Last data filed at 10/11/2023 0545 Gross per 24 hour  Intake 983.33 ml  Output 270 ml  Net 713.33 ml   Filed Weights   10/09/23 1208  Weight: 88.5 kg     Exam: General exam: In no acute distress. Respiratory system: Good air movement and clear to auscultation. Cardiovascular system: S1 & S2 heard, RRR. No JVD. Gastrointestinal system: Abdomen is nondistended, soft and nontender.  Extremities: No pedal edema. Skin: No rashes, lesions or ulcers Psychiatry: Judgement and insight appear normal. Mood & affect appropriate. Data Reviewed:    Labs: Basic Metabolic Panel: Recent Labs  Lab 10/09/23 1323 10/10/23 0529  NA 138 131*  K 4.4 3.6  CL 101 101  CO2 22 16*  GLUCOSE 97 127*  BUN 99* 119*  CREATININE 7.64* 7.90*  CALCIUM 10.0 8.8*   GFR Estimated Creatinine Clearance: 9.7 mL/min (A) (by C-G formula based on SCr of 7.9 mg/dL (H)). Liver Function Tests: Recent Labs  Lab 10/09/23 1323  AST 15  ALT 14  ALKPHOS 62  BILITOT 0.4  PROT 6.4*  ALBUMIN 3.4*   No results for input(s): "LIPASE", "AMYLASE" in the last 168 hours. No results for input(s): "AMMONIA" in the last 168 hours. Coagulation profile Recent Labs  Lab 10/09/23 1323  INR 0.9   COVID-19 Labs  Recent Labs    10/10/23 0708  FERRITIN 706*    Lab Results  Component Value Date   SARSCOV2NAA NEGATIVE 09/10/2020   SARSCOV2NAA NEGATIVE 06/10/2020   SARSCOV2NAA NEGATIVE 05/31/2020   SARSCOV2NAA NEGATIVE 05/11/2020    CBC: Recent Labs  Lab 10/09/23 1323 10/10/23 0529  WBC 10.2 10.5  NEUTROABS 7.7  --   HGB 8.2* 6.5*  HCT 24.6* 19.7*  MCV 91.8 89.5  PLT 277 218   Cardiac Enzymes: No results for input(s): "CKTOTAL", "CKMB", "CKMBINDEX", "TROPONINI" in the last 168 hours. BNP (last 3 results) No results for input(s): "PROBNP" in the last 8760 hours. CBG: Recent Labs  Lab 10/09/23 1206  GLUCAP 138*   D-Dimer: No results for input(s): "DDIMER" in the last 72 hours. Hgb A1c: No results for input(s): "HGBA1C" in the last 72 hours. Lipid Profile: No results for input(s): "CHOL", "HDL", "LDLCALC", "TRIG", "CHOLHDL", "LDLDIRECT" in the last  72 hours. Thyroid function studies: No results for input(s): "TSH", "T4TOTAL", "T3FREE", "THYROIDAB" in the last 72 hours.  Invalid input(s): "FREET3" Anemia work up: Recent Labs    10/10/23 0708  VITAMINB12 241  FOLATE 12.7  FERRITIN 706*  TIBC 204*  IRON 106  RETICCTPCT 3.7*   Sepsis Labs: Recent Labs  Lab 10/09/23 1323 10/09/23 1332 10/10/23 0529  WBC 10.2  --  10.5  LATICACIDVEN  --  2.7*  --    Microbiology Recent Results (from the past 240 hours)  MRSA Next Gen by PCR, Nasal     Status: None   Collection Time: 10/10/23 10:46 AM   Specimen: Nasal Mucosa; Nasal Swab  Result Value Ref Range Status   MRSA by PCR Next Gen NOT DETECTED NOT DETECTED Final    Comment: (NOTE) The GeneXpert MRSA Assay (FDA approved for NASAL specimens only), is one component of a comprehensive MRSA colonization surveillance program. It is not intended to diagnose MRSA infection nor to guide or monitor treatment for MRSA infections. Test performance is not FDA approved in patients less than 83 years old. Performed at [facility], [address] [place], [place] [address]  Medications:    sodium chloride   Intravenous Once   atorvastatin  10 mg Oral Daily   [START ON 10/12/2023] calcitRIOL  1.25 mcg Oral Q M,W,F   calcium carbonate  1 tablet Oral BID   Chlorhexidine Gluconate Cloth  6 each Topical Daily   [START ON 10/12/2023] cinacalcet  30 mg Oral Q M,W,F   heparin sodium (porcine)       latanoprost  1 drop Right Eye QHS   lipase/protease/amylase  72,000 Units Oral TID with meals   pantoprazole  40 mg Oral BID   QUEtiapine  200 mg Oral QHS   sertraline  200 mg Oral Daily   timolol  1 drop Right Eye BID   Continuous Infusions:    LOS: 0 days   Marinda Elk  [facility]  10/11/2023, 10:22 AM

## 2023-10-11 NOTE — Progress Notes (Signed)
Received patient in bed to unit.  Alert and oriented.  Informed consent signed and in chart.   TX duration: 4 hours  Patient not tolerated well.  Transported back to the room  Alert, without acute distress.  Hand-off given to patient's nurse.   Access used: catheter Access issues: none  Total UF removed: 0 ml Medication(s) given: albumin 25 g and blood: 1 unit Post HD VS: 105/60 Post HD weight: unable to obtain    10/11/23 0246  Vitals  Temp 98.3 F (36.8 C)  Temp Source Oral  BP 105/60  MAP (mmHg) 74  BP Location Right Arm  BP Method Automatic  Patient Position (if appropriate) Lying  Pulse Rate 100  Pulse Rate Source Monitor  ECG Heart Rate (!) 101  Resp 18  Oxygen Therapy  SpO2 100 %  O2 Device Room Air  Patient Activity (if Appropriate) In bed  Pulse Oximetry Type Continuous  During Treatment Monitoring  Blood Flow Rate (mL/min) 0 mL/min  Arterial Pressure (mmHg) 21.82 mmHg  Venous Pressure (mmHg) -35.75 mmHg  TMP (mmHg) 9.29 mmHg  Ultrafiltration Rate (mL/min) 116 mL/min  Dialysate Flow Rate (mL/min) 300 ml/min  Dialysate Potassium Concentration 3  Dialysate Calcium Concentration 2.5  Duration of HD Treatment -hour(s) 4 hour(s)  Cumulative Fluid Removed (mL) per Treatment  -99.98  Post Treatment  Dialyzer Clearance Lightly streaked  Hemodialysis Intake (mL) 500 mL  Liters Processed 79.2  Fluid Removed (mL) 0 mL  Tolerated HD Treatment No (Comment)  Post-Hemodialysis Comments HD tx completed, UF goal not met due to pt being severely hypotensive, passed out once. albumin 25 g and one unit of blood administered, tolerated well.  AVG/AVF Arterial Site Held (minutes) 0 minutes  AVG/AVF Venous Site Held (minutes) 0 minutes  Note  Patient Observations pt is in bed resting  Hemodialysis Catheter Right Internal jugular  Placement Date/Time: 09/23/23 1656   Ultrasound Used?: No  Orientation: Right  Access Location: Internal jugular  Site Condition No  complications  Blue Lumen Status Flushed;Heparin locked  Red Lumen Status Flushed;Heparin locked;Dead end cap in place  Catheter fill solution Heparin 1000 units/ml  Catheter fill volume (Arterial) 1.6 cc  Catheter fill volume (Venous) 1.6  Dressing Type Transparent  Dressing Status Antimicrobial disc in place  Interventions Antimicrobial disc changed  Drainage Description None  Post treatment catheter status Capped and Clamped

## 2023-10-11 NOTE — Progress Notes (Signed)
Subjective:  Events noted -  went to HD-  did not tolerate well-  low BPs "passed out"  got one unit of blood-  unable to remove any volume - no hgb this am resulted-  he feels better though  Objective Vital signs in last 24 hours: Vitals:   10/11/23 0300 10/11/23 0325 10/11/23 0505 10/11/23 0808  BP: 95/60 (!) 100/56 109/63 114/62  Pulse: (!) 101 96 91 92  Resp: (!) 21 18 18 18   Temp:  98.2 F (36.8 C) 98.8 F (37.1 C)   TempSrc:  Oral Oral   SpO2: 100% 100% 100% 100%  Weight:      Height:       Weight change:   Intake/Output Summary (Last 24 hours) at 10/11/2023 0957 Last data filed at 10/11/2023 0545 Gross per 24 hour  Intake 983.33 ml  Output 270 ml  Net 713.33 ml   NW-  TTS 4 hours-   400 BFR TDC 2K, heparin yes-  hgb was 10.2 on 12/17  mircera 30 q 2 weeks, venofer 50 q week, calcitriol 1.25 tts, sensipar 30 tts EDW 90.3  Assessment/ Plan: Pt is a 72 y.o. yo male with ESRD who was admitted on 10/09/2023 with SOB, DOE, pre syncope and missed HD   Assessment/Plan: 1. SOB/DOE/presyncope-  in retrospect think was more due to low hgb than volume overload-  better with transfusion.  Chest CT no PE- a few small things that will need to be followed up on -  lung nodules 2. ESRD -  normally TTS-  has not been compliant-  BUN very high-  did get full HD yesterday-  will be due tomorrow for holiday schedule  ( Mon, Thurs , Sat this week only )  so I think might be best to run tomorrow here-  observe another night wit possible discharge tomorrow after HD  3. Anemia- not sure if low hgb only due to missed HD and missed ESA -  hgb was 10/2 on 12/17-  no symptoms of bleeding -  is on PPI  4. Secondary hyperparathyroidism-  will order calcitriol, sensipar- not worry about binder right now  5. HTN/volume- low BP, no meds-  dont think very overloaded - actually under EDW-   will see if can get a little UF tomorrow   Cecille Aver    Labs: Basic Metabolic Panel: Recent Labs  Lab  10/09/23 1323 10/10/23 0529  NA 138 131*  K 4.4 3.6  CL 101 101  CO2 22 16*  GLUCOSE 97 127*  BUN 99* 119*  CREATININE 7.64* 7.90*  CALCIUM 10.0 8.8*   Liver Function Tests: Recent Labs  Lab 10/09/23 1323  AST 15  ALT 14  ALKPHOS 62  BILITOT 0.4  PROT 6.4*  ALBUMIN 3.4*   No results for input(s): "LIPASE", "AMYLASE" in the last 168 hours. No results for input(s): "AMMONIA" in the last 168 hours. CBC: Recent Labs  Lab 10/09/23 1323 10/10/23 0529  WBC 10.2 10.5  NEUTROABS 7.7  --   HGB 8.2* 6.5*  HCT 24.6* 19.7*  MCV 91.8 89.5  PLT 277 218   Cardiac Enzymes: No results for input(s): "CKTOTAL", "CKMB", "CKMBINDEX", "TROPONINI" in the last 168 hours. CBG: Recent Labs  Lab 10/09/23 1206  GLUCAP 138*    Iron Studies:  Recent Labs    10/10/23 0708  IRON 106  TIBC 204*  FERRITIN 706*   Studies/Results: CT ANGIO CHEST/ABD/PEL FOR DISSECTION W &/OR WO CONTRAST Result Date: 10/09/2023  CLINICAL DATA:  Near-syncope with pain and nausea. EXAM: CT ANGIOGRAPHY CHEST, ABDOMEN AND PELVIS TECHNIQUE: Non-contrast CT of the chest was initially obtained. Multidetector CT imaging through the chest, abdomen and pelvis was performed using the standard protocol during bolus administration of intravenous contrast. Multiplanar reconstructed images and MIPs were obtained and reviewed to evaluate the vascular anatomy. RADIATION DOSE REDUCTION: This exam was performed according to the departmental dose-optimization program which includes automated exposure control, adjustment of the mA and/or kV according to patient size and/or use of iterative reconstruction technique. CONTRAST:  OMNIPAQUE IOHEXOL 350 MG/ML SOLN COMPARISON:  Chest x-ray 10/09/2023 earlier. Abdomen and pelvis CT without contrast 05/11/2020. No prior chest CT. FINDINGS: CTA CHEST FINDINGS Cardiovascular: On the noncontrast dataset there is no curvilinear high density along the course of the thoracic aorta. Postcontrast  images are late for the arterial phase. Appearance is more nephrographic phase or late portal venous phase limiting evaluation for subtle arterial abnormality. There is scattered atherosclerotic calcified plaque along the thoracic aorta, mild. No clear evidence of dissection or aneurysm formation. Diameter of the ascending aorta at the level of the right pulmonary artery approaches 3.3 cm. There is pulsation artifact. The descending thoracic aorta same level measures 2.8 by 2.7 cm. Diameter of the aortic root approaches 3.1 cm and distal aortic arch 3.0 cm. The origin of the great vessels are grossly preserved. Scattered calcified plaque identified along the great vessels proximally. The heart is nonenlarged. Trace pericardial fluid. There is a right IJ catheter in place with tip extending to the SVC right atrial junction region. Mediastinum/Nodes: Patulous esophagus. Small thyroid gland. No specific abnormal lymph node enlargement identified in the axillary regions, hilum or mediastinum. Lungs/Pleura: No consolidation, pneumothorax or effusion. Minimal areas of scarring atelectatic changes identified. Mild lower lung bronchiectasis on the right. There is a calcified nodule right lower lobe on series 6, image 104 consistent with old granulomatous disease. Slight basilar atelectasis or scar as well. Noncalcified right lower lobe lung nodule measures 7 mm on series 6, image 93. 7 mm left lower lobe lung nodule noncalcified series 6, image 77 as well. Musculoskeletal: Mild degenerative changes along the spine. Review of the MIP images confirms the above findings. CTA ABDOMEN AND PELVIS FINDINGS VASCULAR. Contrast bolus is more in the late portal venous phase early nephrographic phase limiting evaluation for subtle vascular abnormality. Aorta: Mild atherosclerotic calcified plaque. No dissection or aneurysm formation. Celiac: Standard branching pattern of the celiac axis. No significant stenosis. Mild atherosclerotic  plaque. SMA: Mild partially calcified atherosclerotic plaque proximally and along the midportion of the vessel. No significant stenosis centrally. Renals: Mild calcified plaque along the proximal aspects of both renal arteries. Single bilateral main renal arteries are identified. IMA: Patent without evidence of aneurysm, dissection, vasculitis or significant stenosis. Inflow: The iliac vessels are grossly preserved in caliber. Scattered calcified mild plaque identified. No significant stenosis. Veins: No obvious venous abnormality within the limitations of this arterial phase study. Review of the MIP images confirms the above findings. NON-VASCULAR Hepatobiliary: Stable low-attenuation dome lesion on series 10, image 118 measuring 14 mm. Not clearly a simple cyst. Gallbladder is nondilated. Patent portal vein. Pancreas: Mild pancreatic atrophy. There is small cystic area in the uncinate process with some dystrophic calcification measuring 13 mm. Second smaller focus towards the tail on series 10, image 150 measuring 7 mm. Third lesion in the body on coronal series 12, image 64. Recommend further evaluation. Spleen: Normal in size without focal abnormality. Adrenals/Urinary Tract:  Stable nodular thickening of both adrenal glands. Moderate atrophy of the kidneys. No collecting system dilatation. The ureters have normal course and caliber extending down to the urinary bladder. Preserved contours of the urinary bladder. Bladder wall slightly thickened and trabeculated. Bilateral Bosniak 1 renal cysts are identified, left-greater-than-right. Some smaller Bosniak 2 lesions as well. No significant imaging follow-up of these renal lesions at this time. Stomach/Bowel: On this non oral contrast exam large bowel has a normal course and caliber with some scattered stool. Colonic diverticulosis seen particularly along the left side. Normal appendix extends medial to the cecum in the right lower quadrant. There is moderate fluid  and debris along the stomach. Slight fold thickening suggested along the proximal duodenal such as series 10, image 168. There is some prominent adjacent lymph nodes as well. Please correlate with symptoms. Otherwise the small bowel is nondilated. Several loops distally are fluid-filled. Lymphatic: Few prominent lymph nodes in the central mesentery near the pancreas and duodenal. These not pathologic by size criteria. No abnormal lymph nodes in the abdomen and pelvis. Reproductive: Prominent prostate. Mass effect along the base of the bladder. Please correlate for any history of the TURP. Other: Mild anasarca. Musculoskeletal: Mild degenerative changes along the spine and pelvis. Trace anterolisthesis of L4 on L5. Multilevel lumbar spine mild disc bulging. Review of the MIP images confirms the above findings. IMPRESSION: Evaluation limited due to the timing of the contrast bolus, more late portal venous phase. Scattered calcified atherosclerotic plaque diffusely without clear evidence of dissection or aneurysm formation. Bilateral renal atrophy.  Benign appearing renal cystic foci. Colonic diverticulosis.  No bowel obstruction. There is slight wall thickening along the proximal duodenal with some small adjacent nodes which are not pathologic by size criteria but more numerous than usually seen. Please correlate with any symptoms and further workup of the duodenal when clinically appropriate Small cystic areas along the pancreas. Based on size and appearance please correlate with any prior to assess for long-term stability. However these are not seen in 2021 noncontrast CT. Otherwise recommend dedicated MRI evaluation when clinically appropriate. Noncalcified bilateral lung nodules measuring up to 7 mm, 1 on each side. There are some separate calcified foci. Please correlate with any prior to assess for stability otherwise Non-contrast chest CT at 3-6 months is recommended. If the nodules are stable at time of repeat  CT, then future CT at 18-24 months (from today's scan) is considered optional for low-risk patients, but is recommended for high-risk patients. This recommendation follows the consensus statement: Guidelines for Management of Incidental Pulmonary Nodules Detected on CT Images: From the Fleischner Society 2017; Radiology 2017; 284:228-243. Distended urinary bladder with an enlarged prostate. Bladder wall thickening and trabeculation. Electronically Signed   By: Karen Kays M.D.   On: 10/09/2023 18:56   DG Chest 2 View Result Date: 10/09/2023 CLINICAL DATA:  Suspected sepsis. Abdominal pain. Generalized weakness. EXAM: CHEST - 2 VIEW COMPARISON:  09/19/2023. FINDINGS: Low lung volume. Bilateral lung fields are clear. Bilateral costophrenic angles are clear. Normal cardio-mediastinal silhouette. No acute osseous abnormalities. The soft tissues are within normal limits. Right IJ hemodialysis catheter noted with its tip overlying the lower portion of superior vena cava. IMPRESSION: No active cardiopulmonary disease. Electronically Signed   By: Jules Schick M.D.   On: 10/09/2023 12:44   Medications: Infusions:   Scheduled Medications:  sodium chloride   Intravenous Once   atorvastatin  10 mg Oral Daily   calcium carbonate  1 tablet Oral BID  Chlorhexidine Gluconate Cloth  6 each Topical Daily   heparin sodium (porcine)       latanoprost  1 drop Right Eye QHS   lipase/protease/amylase  72,000 Units Oral TID with meals   pantoprazole  40 mg Oral BID   QUEtiapine  200 mg Oral QHS   sertraline  200 mg Oral Daily   timolol  1 drop Right Eye BID    have reviewed scheduled and prn medications.  Physical Exam: General: sitting up-  NAD Heart: RRR-  slightly tachy Lungs: mostly clear Abdomen: soft, non tender Extremities:min edema  Dialysis Access: TDC     10/11/2023,9:57 AM  LOS: 0 days

## 2023-10-12 DIAGNOSIS — R55 Syncope and collapse: Secondary | ICD-10-CM | POA: Diagnosis not present

## 2023-10-12 LAB — URINE CULTURE: Culture: 60000 — AB

## 2023-10-12 LAB — CBC
HCT: 19.6 % — ABNORMAL LOW (ref 39.0–52.0)
Hemoglobin: 6.3 g/dL — CL (ref 13.0–17.0)
MCH: 29 pg (ref 26.0–34.0)
MCHC: 32.1 g/dL (ref 30.0–36.0)
MCV: 90.3 fL (ref 80.0–100.0)
Platelets: 175 10*3/uL (ref 150–400)
RBC: 2.17 MIL/uL — ABNORMAL LOW (ref 4.22–5.81)
RDW: 18 % — ABNORMAL HIGH (ref 11.5–15.5)
WBC: 7.9 10*3/uL (ref 4.0–10.5)
nRBC: 0 % (ref 0.0–0.2)

## 2023-10-12 LAB — RENAL FUNCTION PANEL
Albumin: 3.3 g/dL — ABNORMAL LOW (ref 3.5–5.0)
Anion gap: 11 (ref 5–15)
BUN: 61 mg/dL — ABNORMAL HIGH (ref 8–23)
CO2: 23 mmol/L (ref 22–32)
Calcium: 8.7 mg/dL — ABNORMAL LOW (ref 8.9–10.3)
Chloride: 102 mmol/L (ref 98–111)
Creatinine, Ser: 6.14 mg/dL — ABNORMAL HIGH (ref 0.61–1.24)
GFR, Estimated: 9 mL/min — ABNORMAL LOW (ref >60)
Glucose, Bld: 97 mg/dL (ref 70–99)
Phosphorus: 4.3 mg/dL (ref 2.5–4.6)
Potassium: 3.5 mmol/L (ref 3.5–5.1)
Sodium: 136 mmol/L (ref 135–145)

## 2023-10-12 LAB — HEPATITIS B SURFACE ANTIGEN: Hepatitis B Surface Ag: NONREACTIVE

## 2023-10-12 LAB — PREPARE RBC (CROSSMATCH)

## 2023-10-12 MED ORDER — SODIUM CHLORIDE 0.9% IV SOLUTION: Freq: Once | INTRAVENOUS | Status: AC

## 2023-10-12 MED ORDER — SODIUM CHLORIDE 0.9% IV SOLUTION: Freq: Once | INTRAVENOUS | Status: DC

## 2023-10-12 NOTE — Progress Notes (Signed)
Patient asked what time he was being taken to dialysis. HD nurse said pt wasn't on her list and that it looks like he got bumped to tomorrow. 2uPRBCs and iron was ordered to infuse at HD; Robb Matar, MD notified and informed RN to refer to Nephrologist. Nephro MD who saw pt earlier "offline". RN referred to HD nurse who started secure chat with kidney MD and RN. HD and 2 west RN asked if order set can be modified, currently blood transfusion is apart of HD order set. Nightshift RN updated with situation and added to secure chat messages.

## 2023-10-12 NOTE — Progress Notes (Signed)
Patient ID: Manuel Ortega, male   DOB: 1951/05/28, 72 y.o.   MRN: [id number] S: Feeling well, no complaints.  No orthopnea. O:BP 117/61 (BP Location: Right Arm)   Pulse 94   Temp 98 F (36.7 C)   Resp 20   Ht 6\' 1"  (1.854 m)   Wt 88.5 kg   SpO2 100%   BMI 25.73 kg/m   Intake/Output Summary (Last 24 hours) at 10/12/2023 1031 Last data filed at 10/11/2023 1900 Gross per 24 hour  Intake 240 ml  Output --  Net 240 ml   Intake/Output: I/O last 3 completed shifts: In: 1083.3 [P.O.:580; Blood:503.3] Out: 0   Intake/Output this shift:  No intake/output data recorded. Weight change:  Gen: NAD CVS: RRR Resp:CTA Abd: +BS, soft, NT/ND Ext: no edema  Recent Labs  Lab 10/09/23 1323 10/10/23 0529 10/11/23 1024 10/12/23 0659  NA 138 131* 133* 136  K 4.4 3.6 3.4* 3.5  CL 101 101 96* 102  CO2 22 16* 25 23  GLUCOSE 97 127* 111* 97  BUN 99* 119* 54* 61*  CREATININE 7.64* 7.90* 4.75* 6.14*  ALBUMIN 3.4*  --  3.6 3.3*  CALCIUM 10.0 8.8* 9.0 8.7*  PHOS  --   --  2.1* 4.3  AST 15  --   --   --   ALT 14  --   --   --    Liver Function Tests: Recent Labs  Lab 10/09/23 1323 10/11/23 1024 10/12/23 0659  AST 15  --   --   ALT 14  --   --   ALKPHOS 62  --   --   BILITOT 0.4  --   --   PROT 6.4*  --   --   ALBUMIN 3.4* 3.6 3.3*   No results for input(s): "LIPASE", "AMYLASE" in the last 168 hours. No results for input(s): "AMMONIA" in the last 168 hours. CBC: Recent Labs  Lab 10/09/23 1323 10/10/23 0529 10/11/23 1024 10/12/23 0659  WBC 10.2 10.5 12.1* 7.9  NEUTROABS 7.7  --   --   --   HGB 8.2* 6.5* 7.0* 6.3*  HCT 24.6* 19.7* 20.4* 19.6*  MCV 91.8 89.5 87.2 90.3  PLT 277 218 187 175   Cardiac Enzymes: No results for input(s): "CKTOTAL", "CKMB", "CKMBINDEX", "TROPONINI" in the last 168 hours. CBG: Recent Labs  Lab 10/09/23 1206  GLUCAP 138*    Iron Studies:  Recent Labs    10/10/23 0708  IRON 106  TIBC 204*  FERRITIN 706*   Studies/Results: No results  found.  sodium chloride   Intravenous Once   sodium chloride   Intravenous Once   atorvastatin  10 mg Oral Daily   calcitRIOL  1.25 mcg Oral Q M,W,F   calcium carbonate  1 tablet Oral BID   Chlorhexidine Gluconate Cloth  6 each Topical Q0600   cinacalcet  30 mg Oral Q M,W,F   latanoprost  1 drop Right Eye QHS   lipase/protease/amylase  72,000 Units Oral TID with meals   pantoprazole  40 mg Oral BID   QUEtiapine  200 mg Oral QHS   sertraline  200 mg Oral Daily   timolol  1 drop Right Eye BID    BMET    Component Value Date/Time   NA 136 10/12/2023 0659   K 3.5 10/12/2023 0659   CL 102 10/12/2023 0659   CO2 23 10/12/2023 0659   GLUCOSE 97 10/12/2023 0659   BUN 61 (H) 10/12/2023 0659   CREATININE 6.14 (  H) 10/12/2023 0659   CALCIUM 8.7 (L) 10/12/2023 0659   GFRNONAA 9 (L) 10/12/2023 0659   GFRAA 7 (L) 06/12/2020 0644   CBC    Component Value Date/Time   WBC 7.9 10/12/2023 0659   RBC 2.17 (L) 10/12/2023 0659   HGB 6.3 (LL) 10/12/2023 0659   HCT 19.6 (L) 10/12/2023 0659   PLT 175 10/12/2023 0659   MCV 90.3 10/12/2023 0659   MCH 29.0 10/12/2023 0659   MCHC 32.1 10/12/2023 0659   RDW 18.0 (H) 10/12/2023 0659   LYMPHSABS 1.3 10/09/2023 1323   MONOABS 0.9 10/09/2023 1323   EOSABS 0.2 10/09/2023 1323   BASOSABS 0.0 10/09/2023 1323     NW-  TTS 4 hours-   400 BFR TDC 2K, heparin yes-  hgb was 10.2 on 12/17  mircera 30 q 2 weeks, venofer 50 q week, calcitriol 1.25 tts, sensipar 30 tts EDW 90.3   Assessment/ Plan: Pt is a 72 y.o. yo male with ESRD who was admitted on 10/09/2023 with SOB, DOE, pre syncope and missed HD   Assessment/Plan: 1. SOB/DOE/presyncope-  in retrospect think was more due to low hgb than volume overload-  better with transfusion.  Chest CT no PE- a few small things that will need to be followed up on -  lung nodules 2. ESRD -  normally TTS-  has not been compliant-  BUN very high-  did get full HD yesterday-  will be due tomorrow for holiday schedule  (  Mon, Thurs , Sat this week only )  so I think might be best to run tomorrow here-  observe another night wit possible discharge tomorrow after HD  3. Anemia- not sure if low hgb only due to missed HD and missed ESA -  hgb was 10.2 on 12/17-  no symptoms of bleeding -  is on PPI.  For more transfusions today with HD.  Hgb 6.3. 4. Secondary hyperparathyroidism-  continue with calcitriol, sensipar- not worry about binder right now  5. HTN/volume- low BP, no meds-  dont think very overloaded - actually under EDW-   will see if can get a little UF today  Irena Cords, MD [facility]

## 2023-10-12 NOTE — Progress Notes (Signed)
Pt receives out-pt HD at [facility] [place] on TTS. Will assist as needed.   Shemiah Rosch Renal Navigator [phone] 

## 2023-10-12 NOTE — Plan of Care (Signed)

## 2023-10-12 NOTE — Progress Notes (Signed)
[facility] PROGRESS NOTE    Progress Note  Manuel Ortega  NWG:[id number] DOB: 07-14-1951 DOA: 10/09/2023 PCP: Bryon Lions, PA-C     Brief Narrative:   Manuel Ortega is an 72 y.o. male past medical history of end-stage renal disease on hemodialysis Tuesday Thursdays and Saturdays, prostate cancer status post TURP, hyperparathyroidism, anemia of chronic disease, pancreatic insufficiency BPH who comes in for a near syncopal episode he relates decreased appetite, he relates he got up and started walking and felt short of breath the ED was found tachycardic tachypneic and satting greater 90% on room air. He has missed 2 of his previous dialysis afebrile with no leukocytosis.  CT angio of the chest abdomen and pelvis showed slight thickening of the proximal duodenum, numerous small nonpathologic lymph nodes, small cyst in the pancreas.  Assessment/Plan:   Pre-syncope/volume overload: Due to noncompliance with his dialysis.  He has missed his last 2 sessions, and he relates he has cut short his last 4- 5 sessions before that. On admission orthostatics were  were positive.   He is very anxious patient His workup for infectious etiology is unremarkable. CT showed no acute findings. 2D echo this year showed an EF of 60%. Been weaned to room air.  Essential hypertension Normotensive, no antihypertensive medications at home.  Incidental pancreatic pseudocyst/pancreatic insufficiency: Asymptomatic except for nausea and vomiting possibly due to missed dialysis. Resume Creon.  Anxiety: Probably contributing to his part of his symptoms. Restart his sertraline.  Incidental pulmonary nodule: Measuring up to 7 mm will need a follow-up CT of the chest in 3 to 6 months.  History of prostate cancer: Status post TURP: Noted.  Incisions renal disease on hemodialysis Tuesday Thursdays and Saturdays: He has missed 2 of his hemodialysis sessions this week. Will need dialysis  nonurgent.  Acute on chronic kidney disease: Hemoglobin is 6.3, will go ahead and transfuse 2 units packed red blood cells with dialysis. Check a CBC tomorrow morning.  DVT prophylaxis: lovenox Family Communication:none Status is: Observation The patient remains OBS appropriate and will d/c before 2 midnights.    Code Status:     Code Status Orders  (From admission, onward)           Start     Ordered   10/09/23 2053  Full code  Continuous       Question:  By:  Answer:  Consent: discussion documented in EHR   10/09/23 2052           Code Status History     Date Active Date Inactive Code Status Order ID Comments User Context   09/13/2020 1701 09/15/2020 2039 Full Code [id number]  Heloise Purpura, MD Inpatient   06/11/2020 0015 06/13/2020 1951 Full Code [id number]  Marinda Elk, MD ED   05/11/2020 2230 06/01/2020 2050 Full Code [id number]  Cipriano Bunker, MD ED         IV Access:   Peripheral IV   Procedures and diagnostic studies:   No results found.    Medical Consultants:   None.   Subjective:    Manuel Ortega relates has been is better.  Objective:    Vitals:   10/11/23 1631 10/11/23 1943 10/12/23 0528 10/12/23 0817  BP: 116/68 (!) 109/58 109/65 117/61  Pulse: 87 90 87 94  Resp: 19 18 18 20   Temp: 99.5 F (37.5 C) 99.2 F (37.3 C) 98.2 F (36.8 C) 98 F (36.7 C)  TempSrc: Oral     SpO2: 100% 100% 100%  100%  Weight:      Height:       SpO2: 100 % O2 Flow Rate (L/min): 2 L/min   Intake/Output Summary (Last 24 hours) at 10/12/2023 0915 Last data filed at 10/11/2023 1900 Gross per 24 hour  Intake 340 ml  Output --  Net 340 ml   Filed Weights   10/09/23 1208  Weight: 88.5 kg    Exam: General exam: In no acute distress. Respiratory system: Good air movement and clear to auscultation. Cardiovascular system: S1 & S2 heard, RRR. No JVD. Gastrointestinal system: Abdomen is nondistended, soft and nontender.  Extremities: No  pedal edema. Skin: No rashes, lesions or ulcers Psychiatry: Judgement and insight appear normal. Mood & affect appropriate. Data Reviewed:    Labs: Basic Metabolic Panel: Recent Labs  Lab 10/09/23 1323 10/10/23 0529 10/11/23 1024 10/12/23 0659  NA 138 131* 133* 136  K 4.4 3.6 3.4* 3.5  CL 101 101 96* 102  CO2 22 16* 25 23  GLUCOSE 97 127* 111* 97  BUN 99* 119* 54* 61*  CREATININE 7.64* 7.90* 4.75* 6.14*  CALCIUM 10.0 8.8* 9.0 8.7*  PHOS  --   --  2.1* 4.3   GFR Estimated Creatinine Clearance: 12.5 mL/min (A) (by C-G formula based on SCr of 6.14 mg/dL (H)). Liver Function Tests: Recent Labs  Lab 10/09/23 1323 10/11/23 1024 10/12/23 0659  AST 15  --   --   ALT 14  --   --   ALKPHOS 62  --   --   BILITOT 0.4  --   --   PROT 6.4*  --   --   ALBUMIN 3.4* 3.6 3.3*   No results for input(s): "LIPASE", "AMYLASE" in the last 168 hours. No results for input(s): "AMMONIA" in the last 168 hours. Coagulation profile Recent Labs  Lab 10/09/23 1323  INR 0.9   COVID-19 Labs  Recent Labs    10/10/23 0708  FERRITIN 706*    Lab Results  Component Value Date   SARSCOV2NAA NEGATIVE 09/10/2020   SARSCOV2NAA NEGATIVE 06/10/2020   SARSCOV2NAA NEGATIVE 05/31/2020   SARSCOV2NAA NEGATIVE 05/11/2020    CBC: Recent Labs  Lab 10/09/23 1323 10/10/23 0529 10/11/23 1024 10/12/23 0659  WBC 10.2 10.5 12.1* 7.9  NEUTROABS 7.7  --   --   --   HGB 8.2* 6.5* 7.0* 6.3*  HCT 24.6* 19.7* 20.4* 19.6*  MCV 91.8 89.5 87.2 90.3  PLT 277 218 187 175   Cardiac Enzymes: No results for input(s): "CKTOTAL", "CKMB", "CKMBINDEX", "TROPONINI" in the last 168 hours. BNP (last 3 results) No results for input(s): "PROBNP" in the last 8760 hours. CBG: Recent Labs  Lab 10/09/23 1206  GLUCAP 138*   D-Dimer: No results for input(s): "DDIMER" in the last 72 hours. Hgb A1c: No results for input(s): "HGBA1C" in the last 72 hours. Lipid Profile: No results for input(s): "CHOL", "HDL",  "LDLCALC", "TRIG", "CHOLHDL", "LDLDIRECT" in the last 72 hours. Thyroid function studies: No results for input(s): "TSH", "T4TOTAL", "T3FREE", "THYROIDAB" in the last 72 hours.  Invalid input(s): "FREET3" Anemia work up: Recent Labs    10/10/23 0708  VITAMINB12 241  FOLATE 12.7  FERRITIN 706*  TIBC 204*  IRON 106  RETICCTPCT 3.7*   Sepsis Labs: Recent Labs  Lab 10/09/23 1323 10/09/23 1332 10/10/23 0529 10/11/23 1024 10/12/23 0659  WBC 10.2  --  10.5 12.1* 7.9  LATICACIDVEN  --  2.7*  --   --   --    Microbiology Recent  Results (from the past 240 hours)  Urine Culture     Status: Abnormal   Collection Time: 10/09/23  1:23 PM   Specimen: Urine, Random  Result Value Ref Range Status   Specimen Description   Final    URINE, RANDOM Performed at [facility], [address], [place], [place] [address]    Special Requests   Final    NONE Reflexed from [id number] Performed at [facility], [address], [place], [place] [address]    Culture 60,000 COLONIES/mL ENTEROCOCCUS FAECALIS (A)  Final   Report Status 10/12/2023 FINAL  Final   Organism ID, Bacteria ENTEROCOCCUS FAECALIS (A)  Final      Susceptibility   Enterococcus faecalis - MIC*    AMPICILLIN <=2 SENSITIVE Sensitive     NITROFURANTOIN <=16 SENSITIVE Sensitive     VANCOMYCIN 1 SENSITIVE Sensitive     * 60,000 COLONIES/mL ENTEROCOCCUS FAECALIS  MRSA Next Gen by PCR, Nasal     Status: None   Collection Time: 10/10/23 10:46 AM   Specimen: Nasal Mucosa; Nasal Swab  Result Value Ref Range Status   MRSA by PCR Next Gen NOT DETECTED NOT DETECTED Final    Comment: (NOTE) The GeneXpert MRSA Assay (FDA approved for NASAL specimens only), is one component of a comprehensive MRSA colonization surveillance program. It is not intended to diagnose MRSA infection nor to guide or monitor treatment for MRSA infections. Test performance is not FDA approved in patients less than 28  years old. Performed at [facility], [address] [place], [place] [address]      Medications:    sodium chloride   Intravenous Once   sodium chloride   Intravenous Once   atorvastatin  10 mg Oral Daily   calcitRIOL  1.25 mcg Oral Q M,W,F   calcium carbonate  1 tablet Oral BID   Chlorhexidine Gluconate Cloth  6 each Topical Q0600   cinacalcet  30 mg Oral Q M,W,F   latanoprost  1 drop Right Eye QHS   lipase/protease/amylase  72,000 Units Oral TID with meals   pantoprazole  40 mg Oral BID   QUEtiapine  200 mg Oral QHS   sertraline  200 mg Oral Daily   timolol  1 drop Right Eye BID   Continuous Infusions:  iron sucrose        LOS: 1 day   Marinda Elk  [facility]  10/12/2023, 9:15 AM

## 2023-10-13 DIAGNOSIS — D649 Anemia, unspecified: Secondary | ICD-10-CM

## 2023-10-13 DIAGNOSIS — R1084 Generalized abdominal pain: Secondary | ICD-10-CM

## 2023-10-13 DIAGNOSIS — K921 Melena: Secondary | ICD-10-CM

## 2023-10-13 DIAGNOSIS — D500 Iron deficiency anemia secondary to blood loss (chronic): Secondary | ICD-10-CM

## 2023-10-13 DIAGNOSIS — R112 Nausea with vomiting, unspecified: Secondary | ICD-10-CM | POA: Diagnosis not present

## 2023-10-13 DIAGNOSIS — R55 Syncope and collapse: Secondary | ICD-10-CM | POA: Diagnosis not present

## 2023-10-13 DIAGNOSIS — K8689 Other specified diseases of pancreas: Secondary | ICD-10-CM

## 2023-10-13 DIAGNOSIS — R630 Anorexia: Secondary | ICD-10-CM | POA: Diagnosis not present

## 2023-10-13 DIAGNOSIS — D62 Acute posthemorrhagic anemia: Secondary | ICD-10-CM

## 2023-10-13 DIAGNOSIS — N189 Chronic kidney disease, unspecified: Secondary | ICD-10-CM

## 2023-10-13 DIAGNOSIS — R933 Abnormal findings on diagnostic imaging of other parts of digestive tract: Secondary | ICD-10-CM

## 2023-10-13 DIAGNOSIS — E8779 Other fluid overload: Secondary | ICD-10-CM | POA: Diagnosis not present

## 2023-10-13 DIAGNOSIS — R339 Retention of urine, unspecified: Secondary | ICD-10-CM

## 2023-10-13 DIAGNOSIS — D1803 Hemangioma of intra-abdominal structures: Secondary | ICD-10-CM

## 2023-10-13 LAB — RENAL FUNCTION PANEL
Albumin: 3 g/dL — ABNORMAL LOW (ref 3.5–5.0)
Anion gap: 11 (ref 5–15)
BUN: 65 mg/dL — ABNORMAL HIGH (ref 8–23)
CO2: 22 mmol/L (ref 22–32)
Calcium: 8.9 mg/dL (ref 8.9–10.3)
Chloride: 102 mmol/L (ref 98–111)
Creatinine, Ser: 7.4 mg/dL — ABNORMAL HIGH (ref 0.61–1.24)
GFR, Estimated: 7 mL/min — ABNORMAL LOW (ref >60)
Glucose, Bld: 88 mg/dL (ref 70–99)
Phosphorus: 5.6 mg/dL — ABNORMAL HIGH (ref 2.5–4.6)
Potassium: 3.6 mmol/L (ref 3.5–5.1)
Sodium: 135 mmol/L (ref 135–145)

## 2023-10-13 LAB — CBC
HCT: 20.7 % — ABNORMAL LOW (ref 39.0–52.0)
HCT: 22.8 % — ABNORMAL LOW (ref 39.0–52.0)
Hemoglobin: 7 g/dL — ABNORMAL LOW (ref 13.0–17.0)
Hemoglobin: 7.7 g/dL — ABNORMAL LOW (ref 13.0–17.0)
MCH: 30 pg (ref 26.0–34.0)
MCH: 30.1 pg (ref 26.0–34.0)
MCHC: 33.8 g/dL (ref 30.0–36.0)
MCHC: 33.8 g/dL (ref 30.0–36.0)
MCV: 88.8 fL (ref 80.0–100.0)
MCV: 89.1 fL (ref 80.0–100.0)
Platelets: 174 10*3/uL (ref 150–400)
Platelets: 199 10*3/uL (ref 150–400)
RBC: 2.33 MIL/uL — ABNORMAL LOW (ref 4.22–5.81)
RBC: 2.56 MIL/uL — ABNORMAL LOW (ref 4.22–5.81)
RDW: 17.6 % — ABNORMAL HIGH (ref 11.5–15.5)
RDW: 17.5 % — ABNORMAL HIGH (ref 11.5–15.5)
WBC: 7.2 10*3/uL (ref 4.0–10.5)
WBC: 9.4 10*3/uL (ref 4.0–10.5)
nRBC: 0.3 % — ABNORMAL HIGH (ref 0.0–0.2)
nRBC: 0.2 % (ref 0.0–0.2)

## 2023-10-13 LAB — HEPATITIS B SURFACE ANTIBODY, QUANTITATIVE: Hep B S AB Quant (Post): 179 m[IU]/mL

## 2023-10-13 LAB — PREPARE RBC (CROSSMATCH)

## 2023-10-13 MED ORDER — HEPARIN SODIUM (PORCINE) 1000 UNIT/ML IJ SOLN
INTRAMUSCULAR | Status: AC
  Filled 2023-10-13: qty 3

## 2023-10-13 MED ORDER — HEPARIN SODIUM (PORCINE) 1000 UNIT/ML IJ SOLN
3200.0000 [IU] | Freq: Once | INTRAMUSCULAR | Status: AC
  Administered 2023-10-13: 3200 [IU]

## 2023-10-13 MED ORDER — SODIUM CHLORIDE 0.9% IV SOLUTION: Freq: Once | INTRAVENOUS | Status: DC

## 2023-10-13 NOTE — Progress Notes (Signed)
   10/13/23 1657  Vitals  Temp 99.3 F (37.4 C)  Pulse Rate 90  Resp (!) 21  BP 117/61  SpO2 98 %  O2 Device Room Air  Oxygen Therapy  Patient Activity (if Appropriate) In bed  Pulse Oximetry Type Continuous  Oximetry Probe Site Changed No  Post Treatment  Dialyzer Clearance Lightly streaked  Hemodialysis Intake (mL) 0 mL  Liters Processed 81.2  Fluid Removed (mL) 400 mL  Tolerated HD Treatment Yes  AVG/AVF Arterial Site Held (minutes) 0 minutes  AVG/AVF Venous Site Held (minutes) 0 minutes   Received patient in bed to unit.  Alert and oriented.  Informed consent signed and in chart.   TX duration: 3:23  Patient tolerated well.  Transported back to the room  Alert, without acute distress.  Hand-off given to patient's nurse.   Access used: Right internal jugular DLC Access issues: Lines reversed on onset of tx  Total UF removed: 400 nephrology will be made aware Medication(s) given: 2 pack cells, venefer 100 mg IV x 1   Rock Hummer, LPN  [facility]

## 2023-10-13 NOTE — Plan of Care (Signed)
   Problem: Health Behavior/Discharge Planning: Goal: Ability to manage health-related needs will improve Outcome: Progressing   Problem: Clinical Measurements: Goal: Ability to maintain clinical measurements within normal limits will improve Outcome: Progressing   Problem: Clinical Measurements: Goal: Will remain free from infection Outcome: Progressing

## 2023-10-13 NOTE — Consult Note (Signed)
 Consultation Note   Referring Provider:  [facility] PCP: Valma Lannie LABOR, PA-C Primary Gastroenterologist: [facility]        Reason for Consultation: Anemia,  reported dark stool  DOA: 10/09/2023         Hospital Day: 5   ASSESSMENT    Brief Narrative:  72 y.o. year old male with a history of ESRD on HD TTS, pancreatic insufficiency, severe diverticulosis , small hiatal hernia, prostate cancer status post TURP, anemia of chronic disease.   Acute on chronic [place] anemia / black stools (at home) for several weeks along with decreased appetite and N& V and CT scan findings of slight wall thickening along the proximal duodenal with some small adjacent nodes which are not pathologic by size criteria but more numerous than usually seen.  GI bleed possibly secondary to duodenitis, PUD or AVMs.  No evidence for iron  deficiency based on labs Hemoglobin was 10.5 mid December, down to 6.5 this admission with minimal improvement after 2 units of PRBCs and then recurrent decline in hemoglobin yesterday to 6.3.   ? Chronic pancreatitis / Pancreatic insufficiency, on Creon .   CT scan >> pancreatic atrophy, pancreatic calcifications  / small cystic lesion in the pancreatic tail  --On Creon   Hepatic hemangioma, 1.2 cm - CT scan of the abdomen wo and w contrast on 09/30/2023 in Care Everywhere  ESRD on HD TTS   Principal Problem:   Pre-syncope Active Problems:   Essential hypertension   Anemia in chronic kidney disease   ESRD on dialysis [facility])   Prostate cancer (HCC)   Pulmonary nodule   Dyspnea on exertion   Anxiety   Pancreatic cyst   SOB (shortness of breath)   Acute blood loss anemia   Melanotic stools      PLAN:   --Additional units of blood have already been ordered by admitting team -- Continue twice daily PPI --Most likely not able to proceed with EGD today as he had breakfast at 8:30 am. Not sure it will be done tomorrow  given Holiday. Most likely EGD on Thursday.  --Needs recommended 3 year colonoscopy in Aug 2025 ( can be done by his primary GI depending on clinical course)  HPI  Oney was admitted 10/09/2023 with near syncopal episode.  He had missed a couple of dialysis sessions.   He was found to have acute on chronic anemia. Hgb declined into 6 range this admission, it was 10.5 a few weeks ago.  [place] tells me he has been having dark/black stools for several weeks.  He has also had a complete loss of his appetite and nausea and vomiting corresponding with the black stools.  He cannot quantify weight loss over the last several weeks.  He does not think there has been any blood in emesis but then admits to not looking . He has very transient LLQ pain during a bowel movement but no upper abdominal pain.  He denies NSAID use.  He is not does take anticoagulants/antiplatelet medications..   Notable labs / Imaging / Events this admission  :   Ferritin 706, TIBC 204, iron  saturation elevated at 52%, folate 12.7   Previous GI Evaluations   Colonoscopy August 2022 - [facility] for colon  cancer screening and diarrhea -- Adequate prep but there was residual stool in the left colon.  Multiple large, severe extensive pancolonic diverticula.  One sessile polyp smaller than 5 mm in the cecum, removed.  1 sessile polyp smaller than 5 mm in the rectum was removed.  .  2 sessile polyps smaller than 5 mm in the ascending colon were removed.  .  The transverse, descending colon and sigmoid colon all appeared normal.  Random biopsies taken for evaluation of diarrhea.  Recommended to repeat colonoscopy in 3 years  Final Diagnosis   1.  Colon, cecal and ascending, polyps: -Fragments of benign colonic mucosa with prominent lymphoid aggregates Polypoid fragments of benign colonic mucosa -Deeper levels have been examined from this tissue  2.  Colon, biopsy: -Fragments of histologically unremarkable colonic mucosa -The  histopathological changes of microscopic (lymphocytic) colitis are not identified 3.  Colon, rectal, polyp: -Hyperplastic polyp, multiple fragments    Fecal calprotectin 5 Pancreatic elastase to  Labs and Imaging: Recent Labs    10/11/23 1024 10/12/23 0659 10/13/23 0604  WBC 12.1* 7.9 7.2  HGB 7.0* 6.3* 7.0*  HCT 20.4* 19.6* 20.7*  PLT 187 175 174   Recent Labs    10/11/23 1024 10/12/23 0659 10/13/23 0604  NA 133* 136 135  K 3.4* 3.5 3.6  CL 96* 102 102  CO2 25 23 22   GLUCOSE 111* 97 88  BUN 54* 61* 65*  CREATININE 4.75* 6.14* 7.40*  CALCIUM  9.0 8.7* 8.9   Recent Labs    10/13/23 0604  ALBUMIN  3.0*   Recent Labs    10/10/23 1404 10/11/23 2220  HEPBSAG NON REACTIVE NON REACTIVE  HEPBIGM NON REACTIVE  --    No results for input(s): LABPROT, INR in the last 72 hours.    Past Medical History:  Diagnosis Date   AKI (acute kidney injury) (HCC) 05/2020   possibly due to prostate blockage   Anxiety    Arthritis    Depression    Dyspnea    Glaucoma    Hypertension    Prostate cancer [facility])     Past Surgical History:  Procedure Laterality Date   DIALYSIS/PERMA CATHETER INSERTION N/A 09/23/2023   Procedure: DIALYSIS/PERMA CATHETER INSERTION;  Surgeon: Melia Lynwood ORN, MD;  Location: [facility] INVASIVE CV LAB;  Service: Cardiovascular;  Laterality: N/A;   DIALYSIS/PERMA CATHETER REMOVAL N/A 09/23/2023   Procedure: DIALYSIS/PERMA CATHETER REMOVAL;  Surgeon: Melia Lynwood ORN, MD;  Location: [facility] INVASIVE CV LAB;  Service: Cardiovascular;  Laterality: N/A;   growth in nostril     biopsied negatiove   IR FLUORO GUIDE CV LINE RIGHT  05/12/2020   IR FLUORO GUIDE CV LINE RIGHT  05/28/2020   IR US  GUIDE VASC ACCESS RIGHT  05/12/2020   IR US  GUIDE VASC ACCESS RIGHT  05/28/2020   TRANSURETHRAL RESECTION OF PROSTATE N/A 09/13/2020   Procedure: TRANSURETHRAL RESECTION OF THE PROSTATE (TURP) WITH CYSTOSCOPY;  Surgeon: Renda Glance, MD;  Location: [facility] ORS;  Service: Urology;   Laterality: N/A;    Family History  Problem Relation Age of Onset   Stroke Mother    Breast cancer Neg Hx    Colon cancer Neg Hx    Prostate cancer Neg Hx    Pancreatic cancer Neg Hx     Prior to Admission medications   Medication Sig Start Date End Date Taking? Authorizing Provider  azelastine (ASTELIN) 0.1 % nasal spray Place 1 spray into both nostrils 2 (two) times daily as needed for rhinitis  or allergies. 09/17/22 10/10/23 Yes [provider]  calcium  acetate (PHOSLO ) 667 MG capsule Take 667-1,334 mg by mouth See admin instructions. Take 1,334 mg by mouth once a day and 667 mg in the evening   Yes [provider]  calcium  carbonate (TUMS) 500 MG chewable tablet Chew 1 tablet by mouth 3 (three) times daily as needed for indigestion or heartburn. 03/18/22  Yes [provider]  CREON  36000-114000 units CPEP capsule Take 36,000-72,000 Units by mouth See admin instructions. Take 72,000 units by mouth three time a day with meals and 36,000 units three times a day with snacks   Yes [provider]  diphenhydrAMINE  (BENADRYL ) 25 mg capsule Take 25 mg by mouth every 4 (four) hours as needed for itching or allergies.   Yes [provider]  ipratropium (ATROVENT) 0.03 % nasal spray Place 2 sprays into both nostrils every 12 (twelve) hours as needed for rhinitis.   Yes [provider]  ketoconazole (NIZORAL) 2 % cream Apply 1 Application topically See admin instructions. Apply to affected toe 2 times a day 11/17/22 11/17/23 Yes [provider]  LUMIGAN  0.01 % SOLN Place 1 drop into both eyes at bedtime.   Yes [provider]  mirtazapine  (REMERON ) 15 MG tablet Take 15 mg by mouth at bedtime.   Yes [provider]  montelukast  (SINGULAIR ) 10 MG tablet Take 10 mg by mouth at bedtime.   Yes [provider]  ondansetron  (ZOFRAN -ODT) 8 MG disintegrating tablet Take 8 mg by mouth every 8 (eight) hours as needed for nausea  or vomiting (dissolve orally).   Yes [provider]  QUEtiapine  (SEROQUEL ) 100 MG tablet Take 200 mg by mouth at bedtime.   Yes [provider]  sertraline  (ZOLOFT ) 100 MG tablet Take 200 mg by mouth in the morning.   Yes [provider]  sildenafil (VIAGRA) 100 MG tablet Take 100 mg by mouth daily as needed for erectile dysfunction. 10/29/22  Yes [provider]  timolol  (BETIMOL ) 0.25 % ophthalmic solution Place 1 drop into both eyes in the morning.   Yes [provider]  TYLENOL  500 MG tablet Take 500-1,000 mg by mouth every 6 (six) hours as needed for mild pain (pain score 1-3) or headache.   Yes [provider]  zolpidem  (AMBIEN ) 10 MG tablet Take 10 mg by mouth at bedtime as needed for sleep.   Yes [provider]  clotrimazole -betamethasone  (LOTRISONE ) cream APPLY 1 APPLICATION TOPICALLY DAILY Patient not taking: Reported on 10/10/2023 04/13/23   Silva Juliene SAUNDERS, DPM  docusate sodium  (COLACE) 100 MG capsule Take 1 capsule (100 mg total) by mouth 2 (two) times daily. Patient not taking: Reported on 10/10/2023 06/01/20   Pokhrel, Laxman, MD  FLUoxetine  (PROZAC ) 40 MG capsule Take 1 capsule (40 mg total) by mouth daily. Patient not taking: Reported on 10/10/2023 06/02/20   Pokhrel, Laxman, MD  magnesium  oxide (MAG-OX) 400 (241.3 Mg) MG tablet Take 1 tablet (400 mg total) by mouth daily. Patient not taking: Reported on 10/10/2023 06/02/20   Pokhrel, Laxman, MD  melatonin 5 MG TABS Take 1 tablet (5 mg total) by mouth at bedtime. Patient not taking: Reported on 10/10/2023 06/01/20   Pokhrel, Laxman, MD  tamsulosin  (FLOMAX ) 0.4 MG CAPS capsule Take 2 capsules (0.8 mg total) by mouth daily after supper. Patient not taking: Reported on 10/10/2023 06/01/20   Pokhrel, Laxman, MD    Current Facility-Administered Medications  Medication Dose Route Frequency Provider Last Rate Last  Admin   0.9 %  sodium chloride  infusion (Manually program via  Guardrails IV Fluids)   Intravenous Once Crosley, Debby, MD       0.9 %  sodium chloride  infusion (Manually program via Guardrails IV Fluids)   Intravenous Once Odell Castor, Erle, MD       0.9 %  sodium chloride  infusion (Manually program via Guardrails IV Fluids)   Intravenous Once Odell Castor, Erle, MD       alum & mag hydroxide-simeth (MAALOX/MYLANTA) 200-200-20 MG/5ML suspension 15 mL  15 mL Oral Q4H PRN Tu, Ching T, DO   15 mL at 10/10/23 1700   atorvastatin  (LIPITOR) tablet 10 mg  10 mg Oral Daily Odell Castor Erle, MD   10 mg at 10/12/23 9177   calcitRIOL  (ROCALTROL ) capsule 1.25 mcg  1.25 mcg Oral Q M,W,F Goldsborough, Kellie, MD   1.25 mcg at 10/12/23 9183   calcium  carbonate (TUMS - dosed in mg elemental calcium ) chewable tablet 200 mg of elemental calcium   1 tablet Oral BID Odell Castor Erle, MD   200 mg of elemental calcium  at 10/12/23 2204   Chlorhexidine  Gluconate Cloth 2 % PADS 6 each  6 each Topical Q0600 Goldsborough, Kellie, MD   6 each at 10/13/23 0630   cinacalcet  (SENSIPAR ) tablet 30 mg  30 mg Oral Q M,W,F Goldsborough, Kellie, MD   30 mg at 10/12/23 9183   iron  sucrose (VENOFER ) 200 mg in sodium chloride  0.9 % 100 mL IVPB  200 mg Intravenous Q M,W,F-HD Goldsborough, Kellie, MD       latanoprost  (XALATAN ) 0.005 % ophthalmic solution 1 drop  1 drop Right Eye QHS Odell Castor Erle, MD   1 drop at 10/12/23 2205   lipase/protease/amylase (CREON ) capsule 36,000 Units  36,000 Units Oral TID PRN Odell Castor Erle, MD   36,000 Units at 10/12/23 2011   lipase/protease/amylase (CREON ) capsule 72,000 Units  72,000 Units Oral TID with meals Odell Castor Erle, MD   72,000 Units at 10/13/23 9191   pantoprazole  (PROTONIX ) EC tablet 40 mg  40 mg Oral BID Odell Castor Erle, MD   40 mg at 10/12/23 2204   QUEtiapine  (SEROQUEL ) tablet 200 mg  200 mg Oral QHS Odell Castor Erle, MD   200 mg at 10/12/23 2204   sertraline  (ZOLOFT ) tablet 200 mg  200 mg Oral Daily Tu, Ching T,  DO   200 mg at 10/12/23 9183   sodium chloride  (OCEAN) 0.65 % nasal spray 1 spray  1 spray Each Nare PRN Odell Castor Erle, MD   1 spray at 10/10/23 1844   timolol  (TIMOPTIC ) 0.5 % ophthalmic solution 1 drop  1 drop Right Eye BID Odell Castor Erle, MD   1 drop at 10/12/23 2205    Allergies as of 10/09/2023 - Review Complete 10/09/2023  Allergen Reaction Noted   Penicillins Other (See Comments) 05/11/2020    Social History   Socioeconomic History   Marital status: Divorced    Spouse name: Not on file   Number of children: Not on file   Years of education: Not on file   Highest education level: Not on file  Occupational History   Occupation: art gallery manager  Tobacco Use   Smoking status: Never   Smokeless tobacco: Never  Vaping Use   Vaping status: Never Used  Substance and Sexual Activity   Alcohol use: Yes    Alcohol/week: 1.0 standard drink of alcohol    Types: 1 Glasses of wine per week    Comment: rare  Drug use: Never   Sexual activity: Not Currently    Birth control/protection: None  Other Topics Concern   Not on file  Social History Narrative   Right handed   Drinks caffeine   Lives here in [place] for 6 years, came from [place]   Lives alone   Social Drivers of Health   Financial Resource Strain: Low Risk  (11/17/2022)   Received from [facility], [facility]   Overall Financial Resource Strain (CARDIA)    Difficulty of Paying Living Expenses: Not hard at all  Recent Concern: Financial Resource Strain - High Risk (08/21/2022)   Received from [facility]   Overall Financial Resource Strain (CARDIA)    Difficulty of Paying Living Expenses: Hard  Food Insecurity: No Food Insecurity (10/10/2023)   Hunger Vital Sign    Worried About Running Out of Food in the Last Year: Never true    Ran Out of Food in the Last Year: Never true  Transportation Needs: No Transportation Needs (10/10/2023)   PRAPARE - Administrator, Civil Service (Medical):  No    Lack of Transportation (Non-Medical): No  Physical Activity: Sufficiently Active (08/21/2022)   Received [facility]   Exercise Vital Sign    Days of Exercise per Week: 4 days    Minutes of Exercise per Session: 50 min  Stress: Stress Concern Present (08/21/2022)   Received from [facility], [facility] - Occupational Stress Questionnaire    Feeling of Stress : To some extent  Social Connections: Moderately Isolated (10/12/2023)   Social Connection and Isolation Panel [NHANES]    Frequency of Communication with Friends and Family: Twice a week    Frequency of Social Gatherings with Friends and Family: Once a week    Attends Religious Services: Never    Database Administrator or Organizations: Yes    Attends Engineer, Structural: More than 4 times per year    Marital Status: Divorced  Intimate Partner Violence: Not At Risk (10/10/2023)   Humiliation, Afraid, Rape, and Kick questionnaire    Fear of Current or Ex-Partner: No    Emotionally Abused: No    Physically Abused: No    Sexually Abused: No    Code Status   Code Status: Full Code  Review of Systems: All systems reviewed and negative except where noted in HPI.  Physical Exam: Vital signs in last 24 hours: Temp:  [98 F (36.7 C)-98.6 F (37 C)] 98 F (36.7 C) (12/31 0739) Pulse Rate:  [80-85] 84 (12/31 0739) Resp:  [17-18] 18 (12/31 0739) BP: (113-125)/(56-71) 123/56 (12/31 0739) SpO2:  [98 %-100 %] 99 % (12/31 0739) Last BM Date : 10/12/23  General:  Pleasant male in NAD Psych:  Cooperative. Normal mood and affect Eyes: Pupils equal Ears:  Normal auditory acuity Nose: No deformity, discharge or lesions Neck:  Supple, no masses felt Lungs:  Clear to auscultation.  Heart:  Regular rate, regular rhythm.  Abdomen:  Soft, nondistended, nontender, active bowel sounds, no masses felt Rectal :  Deferred Msk: Symmetrical without gross  deformities.  Neurologic:  Alert, oriented, grossly normal neurologically Extremities : No edema Skin:  Intact without significant lesions.    Intake/Output from previous day: 12/30 0701 - 12/31 0700 In: 740 [P.O.:240; Blood:500] Out: -  Intake/Output this shift:  No intake/output data recorded.   Vina Dasen, NP-C   10/13/2023, 9:40 AM

## 2023-10-13 NOTE — Progress Notes (Signed)
 Patient ID: Manuel Ortega, male   DOB: 01/17/1951, 72 y.o.   MRN: [id number] S:No new complaints. Awaiting HD and blood transfusion. O:BP (!) 123/56 (BP Location: Right Arm)   Pulse 84   Temp 98 F (36.7 C)   Resp 18   Ht 6' 1 (1.854 m)   Wt 88.5 kg   SpO2 99%   BMI 25.73 kg/m   Intake/Output Summary (Last 24 hours) at 10/13/2023 1058 Last data filed at 10/13/2023 0018 Gross per 24 hour  Intake 740 ml  Output --  Net 740 ml   Intake/Output: I/O last 3 completed shifts: In: 740 [P.O.:240; Blood:500] Out: -   Intake/Output this shift:  No intake/output data recorded. Weight change:  Gen: NAD CVS: RRR Resp:CTA Abd: +BS, soft, NT/ND Ext: no edema  Recent Labs  Lab 10/09/23 1323 10/10/23 0529 10/11/23 1024 10/12/23 0659 10/13/23 0604  NA 138 131* 133* 136 135  K 4.4 3.6 3.4* 3.5 3.6  CL 101 101 96* 102 102  CO2 22 16* 25 23 22   GLUCOSE 97 127* 111* 97 88  BUN 99* 119* 54* 61* 65*  CREATININE 7.64* 7.90* 4.75* 6.14* 7.40*  ALBUMIN  3.4*  --  3.6 3.3* 3.0*  CALCIUM  10.0 8.8* 9.0 8.7* 8.9  PHOS  --   --  2.1* 4.3 5.6*  AST 15  --   --   --   --   ALT 14  --   --   --   --    Liver Function Tests: Recent Labs  Lab 10/09/23 1323 10/11/23 1024 10/12/23 0659 10/13/23 0604  AST 15  --   --   --   ALT 14  --   --   --   ALKPHOS 62  --   --   --   BILITOT 0.4  --   --   --   PROT 6.4*  --   --   --   ALBUMIN  3.4* 3.6 3.3* 3.0*   No results for input(s): LIPASE, AMYLASE in the last 168 hours. No results for input(s): AMMONIA in the last 168 hours. CBC: Recent Labs  Lab 10/09/23 1323 10/10/23 0529 10/11/23 1024 10/12/23 0659 10/13/23 0604  WBC 10.2 10.5 12.1* 7.9 7.2  NEUTROABS 7.7  --   --   --   --   HGB 8.2* 6.5* 7.0* 6.3* 7.0*  HCT 24.6* 19.7* 20.4* 19.6* 20.7*  MCV 91.8 89.5 87.2 90.3 88.8  PLT 277 218 187 175 174   Cardiac Enzymes: No results for input(s): CKTOTAL, CKMB, CKMBINDEX, TROPONINI in the last 168 hours. CBG: Recent  Labs  Lab 10/09/23 1206  GLUCAP 138*    Iron  Studies: No results for input(s): IRON , TIBC, TRANSFERRIN, FERRITIN in the last 72 hours. Studies/Results: No results found.  sodium chloride    Intravenous Once   sodium chloride    Intravenous Once   sodium chloride    Intravenous Once   atorvastatin   10 mg Oral Daily   calcitRIOL   1.25 mcg Oral Q M,W,F   calcium  carbonate  1 tablet Oral BID   Chlorhexidine  Gluconate Cloth  6 each Topical Q0600   cinacalcet   30 mg Oral Q M,W,F   latanoprost   1 drop Right Eye QHS   lipase/protease/amylase  72,000 Units Oral TID with meals   pantoprazole   40 mg Oral BID   QUEtiapine   200 mg Oral QHS   sertraline   200 mg Oral Daily   timolol   1 drop Right Eye BID  BMET    Component Value Date/Time   NA 135 10/13/2023 0604   K 3.6 10/13/2023 0604   CL 102 10/13/2023 0604   CO2 22 10/13/2023 0604   GLUCOSE 88 10/13/2023 0604   BUN 65 (H) 10/13/2023 0604   CREATININE 7.40 (H) 10/13/2023 0604   CALCIUM  8.9 10/13/2023 0604   GFRNONAA 7 (L) 10/13/2023 0604   GFRAA 7 (L) 06/12/2020 0644   CBC    Component Value Date/Time   WBC 7.2 10/13/2023 0604   RBC 2.33 (L) 10/13/2023 0604   HGB 7.0 (L) 10/13/2023 0604   HCT 20.7 (L) 10/13/2023 0604   PLT 174 10/13/2023 0604   MCV 88.8 10/13/2023 0604   MCH 30.0 10/13/2023 0604   MCHC 33.8 10/13/2023 0604   RDW 17.6 (H) 10/13/2023 0604   LYMPHSABS 1.3 10/09/2023 1323   MONOABS 0.9 10/09/2023 1323   EOSABS 0.2 10/09/2023 1323   BASOSABS 0.0 10/09/2023 1323    NW-  TTS 4 hours-   400 BFR TDC 2K, heparin  yes-  hgb was 10.2 on 12/17  mircera 30 q 2 weeks, venofer  50 q week, calcitriol  1.25 tts, sensipar  30 tts EDW 90.3   Assessment/ Plan: Pt is a 72 y.o. yo male with ESRD who was admitted on 10/09/2023 with SOB, DOE, pre syncope and missed HD   Assessment/Plan: 1. SOB/DOE/presyncope-  in retrospect think was more due to low hgb than volume overload-  better with transfusion.  Chest CT no PE- a  few small things that will need to be followed up on -  lung nodules 2. ESRD -  normally TTS-  has not been compliant-  Was ordered to have HD yesterday with blood but was missed on schedule.  To have HD today and blood.  He will be back on his outpatient schedule of TTS.  3. Anemia- not sure if low hgb only due to missed HD and missed ESA -  hgb was 10.2 on 12/17-  no symptoms of bleeding -  is on PPI.  For more transfusions today with HD.  Hgb 6.3 improved to 7 and to have blood transfusion today.  GI Following and planning further workup.  4. Secondary hyperparathyroidism-  continue with calcitriol , sensipar - not worry about binder right now  5. HTN/volume- low BP, no meds-  dont think very overloaded - actually under EDW-   will see if can get a little UF today  Fairy RONAL Sellar, MD [facility]

## 2023-10-13 NOTE — Progress Notes (Signed)
 [facility] PROGRESS NOTE    Progress Note  Manuel Ortega  FMW:[id number] DOB: [birth year] DOA: 10/09/2023 PCP: Valma Lannie LABOR, PA-C     Brief Narrative:   Manuel Ortega is an 72 y.o. male past medical history of end-stage renal disease on hemodialysis Tuesday Thursdays and Saturdays, prostate cancer status post TURP, hyperparathyroidism, anemia of chronic disease, pancreatic insufficiency BPH who comes in for a near syncopal episode he relates decreased appetite, he relates he got up and started walking and felt short of breath the ED was found tachycardic tachypneic and satting greater 90% on room air. He has missed 2 of his previous dialysis afebrile with no leukocytosis.  CT angio of the chest abdomen and pelvis showed slight thickening of the proximal duodenum, numerous small nonpathologic lymph nodes, small cyst in the pancreas.  Assessment/Plan:   Pre-syncope/volume overload: Due to noncompliance with his dialysis.  He has missed his last 2 sessions, and he relates he has cut short his last 4- 5 sessions before that. On admission orthostatics were  were positive.   His workup for infectious etiology is unremarkable. CT showed no acute findings. 2D echo this year showed an EF of 60%. Has been weaned to room air  Acute blood loss anemia/melanotic stools/Acute on chronic kidney disease:: He has been on PPI since admission, Has been transfused 2 units of packed red blood cells and hemoglobin has remained at 7. Check FOBT. He now relates has been having intermittent melanotic stools for the last 3 weeks. Which he believe is due to the Creon . He denies any abdominal pain, he denies any NSAID's orally but he has been using. Cont PPI bid. Will go ahead and transfuse him 2 units of packed red blood cells during dialysis today. This will make a total of 4 units, since admission  Essential hypertension Normotensive, no antihypertensive medications at home.  Incidental  pancreatic pseudocyst/pancreatic insufficiency: Asymptomatic except for nausea and vomiting possibly due to missed dialysis. Resume Creon .  Anxiety: Probably contributing to his part of his symptoms. Restart his sertraline .  Incidental pulmonary nodule: Measuring up to 7 mm will need a follow-up CT of the chest in 3 to 6 months.  History of prostate cancer: Status post TURP: Noted.  Incisions renal disease on hemodialysis Tuesday Thursdays and Saturdays: He has missed 2 of his hemodialysis sessions this week. Will need dialysis nonurgent.    DVT prophylaxis: lovenox Family Communication:none Status is: Observation The patient remains OBS appropriate and will d/c before 2 midnights.    Code Status:     Code Status Orders  (From admission, onward)           Start     Ordered   10/09/23 2053  Full code  Continuous       Question:  By:  Answer:  Consent: discussion documented in EHR   10/09/23 2052           Code Status History     Date Active Date Inactive Code Status Order ID Comments User Context   09/13/2020 1701 09/15/2020 2039 Full Code [id number]  Renda Glance, MD Inpatient   06/11/2020 0015 06/13/2020 1951 Full Code [id number]  Kenard Zachary PARAS, MD ED   05/11/2020 2230 06/01/2020 2050 Full Code [id number]  Von Bogus, MD ED         IV Access:   Peripheral IV   Procedures and diagnostic studies:   No results found.    Medical Consultants:   None.   Subjective:  Manuel Ortega denies any pain tolerating his diet.  Had melanotic stools this morning.  Objective:    Vitals:   10/12/23 2150 10/13/23 0021 10/13/23 0432 10/13/23 0739  BP: 113/62 121/71 125/70 (!) 123/56  Pulse: 80 85 83 84  Resp: 17 18 18 18   Temp: 98.6 F (37 C) 98.2 F (36.8 C) 98.5 F (36.9 C) 98 F (36.7 C)  TempSrc: Oral Oral    SpO2: 98% 98% 100% 99%  Weight:      Height:       SpO2: 99 % O2 Flow Rate (L/min): 2 L/min   Intake/Output Summary  (Last 24 hours) at 10/13/2023 0838 Last data filed at 10/13/2023 0018 Gross per 24 hour  Intake 740 ml  Output --  Net 740 ml   Filed Weights   10/09/23 1208  Weight: 88.5 kg    Exam: General exam: In no acute distress. Respiratory system: Good air movement and clear to auscultation. Cardiovascular system: S1 & S2 heard, RRR. No JVD. Gastrointestinal system: Abdomen is nondistended, soft and nontender.  Extremities: No pedal edema. Skin: No rashes, lesions or ulcers Psychiatry: Judgement and insight appear normal. Mood & affect appropriate. Data Reviewed:    Labs: Basic Metabolic Panel: Recent Labs  Lab 10/09/23 1323 10/10/23 0529 10/11/23 1024 10/12/23 0659 10/13/23 0604  NA 138 131* 133* 136 135  K 4.4 3.6 3.4* 3.5 3.6  CL 101 101 96* 102 102  CO2 22 16* 25 23 22   GLUCOSE 97 127* 111* 97 88  BUN 99* 119* 54* 61* 65*  CREATININE 7.64* 7.90* 4.75* 6.14* 7.40*  CALCIUM  10.0 8.8* 9.0 8.7* 8.9  PHOS  --   --  2.1* 4.3 5.6*   GFR Estimated Creatinine Clearance: 10.3 mL/min (A) (by C-G formula based on SCr of 7.4 mg/dL (H)). Liver Function Tests: Recent Labs  Lab 10/09/23 1323 10/11/23 1024 10/12/23 0659 10/13/23 0604  AST 15  --   --   --   ALT 14  --   --   --   ALKPHOS 62  --   --   --   BILITOT 0.4  --   --   --   PROT 6.4*  --   --   --   ALBUMIN  3.4* 3.6 3.3* 3.0*   No results for input(s): LIPASE, AMYLASE in the last 168 hours. No results for input(s): AMMONIA in the last 168 hours. Coagulation profile Recent Labs  Lab 10/09/23 1323  INR 0.9   COVID-19 Labs  No results for input(s): DDIMER, FERRITIN, LDH, CRP in the last 72 hours.   Lab Results  Component Value Date   SARSCOV2NAA NEGATIVE 09/10/2020   SARSCOV2NAA NEGATIVE 06/10/2020   SARSCOV2NAA NEGATIVE 05/31/2020   SARSCOV2NAA NEGATIVE 05/11/2020    CBC: Recent Labs  Lab 10/09/23 1323 10/10/23 0529 10/11/23 1024 10/12/23 0659 10/13/23 0604  WBC 10.2 10.5 12.1*  7.9 7.2  NEUTROABS 7.7  --   --   --   --   HGB 8.2* 6.5* 7.0* 6.3* 7.0*  HCT 24.6* 19.7* 20.4* 19.6* 20.7*  MCV 91.8 89.5 87.2 90.3 88.8  PLT 277 218 187 175 174   Cardiac Enzymes: No results for input(s): CKTOTAL, CKMB, CKMBINDEX, TROPONINI in the last 168 hours. BNP (last 3 results) No results for input(s): PROBNP in the last 8760 hours. CBG: Recent Labs  Lab 10/09/23 1206  GLUCAP 138*   D-Dimer: No results for input(s): DDIMER in the last 72 hours. Hgb A1c: No results  for input(s): HGBA1C in the last 72 hours. Lipid Profile: No results for input(s): CHOL, HDL, LDLCALC, TRIG, CHOLHDL, LDLDIRECT in the last 72 hours. Thyroid function studies: No results for input(s): TSH, T4TOTAL, T3FREE, THYROIDAB in the last 72 hours.  Invalid input(s): FREET3 Anemia work up: No results for input(s): VITAMINB12, FOLATE, FERRITIN, TIBC, IRON , RETICCTPCT in the last 72 hours.  Sepsis Labs: Recent Labs  Lab 10/09/23 1332 10/10/23 0529 10/11/23 1024 10/12/23 0659 10/13/23 0604  WBC  --  10.5 12.1* 7.9 7.2  LATICACIDVEN 2.7*  --   --   --   --    Microbiology Recent Results (from the past 240 hours)  Urine Culture     Status: Abnormal   Collection Time: 10/09/23  1:23 PM   Specimen: Urine, Random  Result Value Ref Range Status   Specimen Description   Final    URINE, RANDOM Performed at [facility], [address] [place], [place] [address]    Special Requests   Final    NONE Reflexed from [phone] [id number] Performed at [facility], [address], [place], [place] [address]    Culture 60,000 COLONIES/mL ENTEROCOCCUS FAECALIS (A)  Final   Report Status 10/12/2023 FINAL  Final   Organism ID, Bacteria ENTEROCOCCUS FAECALIS (A)  Final      Susceptibility   Enterococcus faecalis - MIC*    AMPICILLIN <=2 SENSITIVE Sensitive     NITROFURANTOIN <=16 SENSITIVE Sensitive     VANCOMYCIN  1 SENSITIVE  Sensitive     * 60,000 COLONIES/mL ENTEROCOCCUS FAECALIS  MRSA Next Gen by PCR, Nasal     Status: None   Collection Time: 10/10/23 10:46 AM   Specimen: Nasal Mucosa; Nasal Swab  Result Value Ref Range Status   MRSA by PCR Next Gen NOT DETECTED NOT DETECTED Final    Comment: (NOTE) The GeneXpert MRSA Assay (FDA approved for NASAL specimens only), is one component of a comprehensive MRSA colonization surveillance program. It is not intended to diagnose MRSA infection nor to guide or monitor treatment for MRSA infections. Test performance is not FDA approved in patients less than 73 years old. Performed at [facility], [address] [place],  [address]      Medications:    sodium chloride    Intravenous Once   sodium chloride    Intravenous Once   sodium chloride    Intravenous Once   atorvastatin   10 mg Oral Daily   calcitRIOL   1.25 mcg Oral Q M,W,F   calcium  carbonate  1 tablet Oral BID   Chlorhexidine  Gluconate Cloth  6 each Topical Q0600   cinacalcet   30 mg Oral Q M,W,F   latanoprost   1 drop Right Eye QHS   lipase/protease/amylase  72,000 Units Oral TID with meals   pantoprazole   40 mg Oral BID   QUEtiapine   200 mg Oral QHS   sertraline   200 mg Oral Daily   timolol   1 drop Right Eye BID   Continuous Infusions:  iron  sucrose        LOS: 2 days   Manuel Ortega  [facility]  10/13/2023, 8:38 AM

## 2023-10-14 DIAGNOSIS — R55 Syncope and collapse: Secondary | ICD-10-CM | POA: Diagnosis not present

## 2023-10-14 DIAGNOSIS — N189 Chronic kidney disease, unspecified: Secondary | ICD-10-CM | POA: Diagnosis not present

## 2023-10-14 DIAGNOSIS — E8779 Other fluid overload: Secondary | ICD-10-CM | POA: Diagnosis not present

## 2023-10-14 DIAGNOSIS — R1084 Generalized abdominal pain: Secondary | ICD-10-CM | POA: Diagnosis not present

## 2023-10-14 LAB — TYPE AND SCREEN
ABO/RH(D): O POS
Antibody Screen: NEGATIVE
Unit division: 0
Unit division: 0
Unit division: 0
Unit division: 0

## 2023-10-14 LAB — BPAM RBC
Blood Product Expiration Date: 202501202359
Blood Product Expiration Date: 202501232359
Blood Product Expiration Date: 202501262359
Blood Product Expiration Date: 202501262359
ISSUE DATE / TIME: 202412282354
ISSUE DATE / TIME: 202412302120
ISSUE DATE / TIME: 202412311415
ISSUE DATE / TIME: 202412311415
Unit Type and Rh: 202501262359
Unit Type and Rh: 5100
Unit Type and Rh: 5100
Unit Type and Rh: 5100
Unit Type and Rh: 5100

## 2023-10-14 LAB — CBC
HCT: 26.9 % — ABNORMAL LOW (ref 39.0–52.0)
Hemoglobin: 9.2 g/dL — ABNORMAL LOW (ref 13.0–17.0)
MCH: 30.1 pg (ref 26.0–34.0)
MCHC: 34.2 g/dL (ref 30.0–36.0)
MCV: 87.9 fL (ref 80.0–100.0)
Platelets: 184 10*3/uL (ref 150–400)
RBC: 3.06 MIL/uL — ABNORMAL LOW (ref 4.22–5.81)
RDW: 17.2 % — ABNORMAL HIGH (ref 11.5–15.5)
WBC: 6.5 10*3/uL (ref 4.0–10.5)
nRBC: 0 % (ref 0.0–0.2)

## 2023-10-14 LAB — RENAL FUNCTION PANEL
Albumin: 3 g/dL — ABNORMAL LOW (ref 3.5–5.0)
Anion gap: 9 (ref 5–15)
BUN: 26 mg/dL — ABNORMAL HIGH (ref 8–23)
CO2: 26 mmol/L (ref 22–32)
Calcium: 8.5 mg/dL — ABNORMAL LOW (ref 8.9–10.3)
Chloride: 102 mmol/L (ref 98–111)
Creatinine, Ser: 4.85 mg/dL — ABNORMAL HIGH (ref 0.61–1.24)
GFR, Estimated: 12 mL/min — ABNORMAL LOW (ref >60)
Glucose, Bld: 95 mg/dL (ref 70–99)
Phosphorus: 4.2 mg/dL (ref 2.5–4.6)
Potassium: 3.5 mmol/L (ref 3.5–5.1)
Sodium: 137 mmol/L (ref 135–145)

## 2023-10-14 MED ORDER — IRON SUCROSE 200 MG IVPB - SIMPLE MED
200.0000 mg | Status: DC
  Filled 2023-10-14: qty 110

## 2023-10-14 NOTE — Progress Notes (Signed)
 [facility] PROGRESS NOTE    Progress Note  Manuel Ortega  FMW:[id number] DOB: 14-Sep-1951 DOA: 10/09/2023 PCP: Valma Lannie LABOR, PA-C     Brief Narrative:   Manuel Ortega is an 73 y.o. male past medical history of end-stage renal disease on hemodialysis Tuesday Thursdays and Saturdays, prostate cancer status post TURP, hyperparathyroidism, anemia of chronic disease, pancreatic insufficiency BPH who comes in for a near syncopal episode he relates decreased appetite, he relates he got up and started walking and felt short of breath the ED was found tachycardic tachypneic and satting greater 90% on room air. He has missed 2 of his previous dialysis afebrile with no leukocytosis.  CT angio of the chest abdomen and pelvis showed slight thickening of the proximal duodenum, numerous small nonpathologic lymph nodes, small cyst in the pancreas.  Assessment/Plan:   Pre-syncope/volume overload: Due to noncompliance with his dialysis.  He has missed his last 2 sessions, and he relates he has cut short his last 4- 5 sessions before that. On admission orthostatics were  were positive.   His workup for infectious etiology is unremarkable. CT showed no acute findings. 2D echo this year showed an EF of 60%. Has been weaned to room air  Acute blood loss anemia/melanotic stools/Acute on chronic kidney disease:: He has been on PPI since admission. Has been transfused 2 units of packed red blood cells and hemoglobin today is 9.1. GI was consulted, who recommended EGD on 10/15/2023. Cont PPI bid.  Essential hypertension Normotensive, no antihypertensive medications at home.  Incidental pancreatic pseudocyst/pancreatic insufficiency: Asymptomatic except for nausea and vomiting possibly due to missed dialysis. Resume Creon .  Anxiety: Probably contributing to his part of his symptoms. Restart his sertraline .  Incidental pulmonary nodule: Measuring up to 7 mm will need a follow-up CT of the  chest in 3 to 6 months.  History of prostate cancer: Status post TURP: Noted.  Incisions renal disease on hemodialysis Tuesday Thursdays and Saturdays: He has missed 2 of his hemodialysis sessions this week. Will need dialysis nonurgent.    DVT prophylaxis: lovenox Family Communication:none Status is: Observation The patient remains OBS appropriate and will d/c before 2 midnights.    Code Status:     Code Status Orders  (From admission, onward)           Start     Ordered   10/09/23 2053  Full code  Continuous       Question:  By:  Answer:  Consent: discussion documented in EHR   10/09/23 2052           Code Status History     Date Active Date Inactive Code Status Order ID Comments User Context   09/13/2020 1701 09/15/2020 2039 Full Code [id number]  Renda Glance, MD Inpatient   06/11/2020 0015 06/13/2020 1951 Full Code [id number]  Kenard Zachary PARAS, MD ED   05/11/2020 2230 06/01/2020 2050 Full Code [id number]  Von Bogus, MD ED         IV Access:   Peripheral IV   Procedures and diagnostic studies:   No results found.    Medical Consultants:   None.   Subjective:    Manuel Ortega feels better symptoms have resolved. Objective:    Vitals:   10/13/23 1657 10/13/23 2000 10/14/23 0411 10/14/23 0718  BP: 117/61 135/80 (!) 112/56 106/65  Pulse: 90 96 76 77  Resp: (!) 21 18 18 18   Temp: 99.3 F (37.4 C) 99.4 F (37.4 C) 98.1 F (36.7 C) 98  F (36.7 C)  TempSrc: Oral Oral    SpO2: 98% 99% 100% 100%  Weight: 86.4 kg     Height:       SpO2: 100 % O2 Flow Rate (L/min): 2 L/min   Intake/Output Summary (Last 24 hours) at 10/14/2023 1011 Last data filed at 10/14/2023 0521 Gross per 24 hour  Intake 970.85 ml  Output 500.4 ml  Net 470.45 ml   Filed Weights   10/09/23 1208 10/13/23 1249 10/13/23 1657  Weight: 88.5 kg 86.4 kg 86.4 kg    Exam: General exam: In no acute distress. Respiratory system: Good air movement and clear to  auscultation. Cardiovascular system: S1 & S2 heard, RRR. No JVD. Gastrointestinal system: Abdomen is nondistended, soft and nontender.  Extremities: No pedal edema. Skin: No rashes, lesions or ulcers Psychiatry: Judgement and insight appear normal. Mood & affect appropriate. Data Reviewed:    Labs: Basic Metabolic Panel: Recent Labs  Lab 10/10/23 0529 10/11/23 1024 10/12/23 0659 10/13/23 0604 10/14/23 0802  NA 131* 133* 136 135 137  K 3.6 3.4* 3.5 3.6 3.5  CL 101 96* 102 102 102  CO2 16* 25 23 22 26   GLUCOSE 127* 111* 97 88 95  BUN 119* 54* 61* 65* 26*  CREATININE 7.90* 4.75* 6.14* 7.40* 4.85*  CALCIUM  8.8* 9.0 8.7* 8.9 8.5*  PHOS  --  2.1* 4.3 5.6* 4.2   GFR Estimated Creatinine Clearance: 15.8 mL/min (A) (by C-G formula based on SCr of 4.85 mg/dL (H)). Liver Function Tests: Recent Labs  Lab 10/09/23 1323 10/11/23 1024 10/12/23 0659 10/13/23 0604 10/14/23 0802  AST 15  --   --   --   --   ALT 14  --   --   --   --   ALKPHOS 62  --   --   --   --   BILITOT 0.4  --   --   --   --   PROT 6.4*  --   --   --   --   ALBUMIN  3.4* 3.6 3.3* 3.0* 3.0*   No results for input(s): LIPASE, AMYLASE in the last 168 hours. No results for input(s): AMMONIA in the last 168 hours. Coagulation profile Recent Labs  Lab 10/09/23 1323  INR 0.9   COVID-19 Labs  No results for input(s): DDIMER, FERRITIN, LDH, CRP in the last 72 hours.   Lab Results  Component Value Date   SARSCOV2NAA NEGATIVE 09/10/2020   SARSCOV2NAA NEGATIVE 06/10/2020   SARSCOV2NAA NEGATIVE 05/31/2020   SARSCOV2NAA NEGATIVE 05/11/2020    CBC: Recent Labs  Lab 10/09/23 1323 10/10/23 0529 10/11/23 1024 10/12/23 0659 10/13/23 0604 10/13/23 1144 10/14/23 0802  WBC 10.2   < > 12.1* 7.9 7.2 9.4 6.5  NEUTROABS 7.7  --   --   --   --   --   --   HGB 8.2*   < > 7.0* 6.3* 7.0* 7.7* 9.2*  HCT 24.6*   < > 20.4* 19.6* 20.7* 22.8* 26.9*  MCV 91.8   < > 87.2 90.3 88.8 89.1 87.9  PLT 277   < >  187 175 174 199 184   < > = values in this interval not displayed.   Cardiac Enzymes: No results for input(s): CKTOTAL, CKMB, CKMBINDEX, TROPONINI in the last 168 hours. BNP (last 3 results) No results for input(s): PROBNP in the last 8760 hours. CBG: Recent Labs  Lab 10/09/23 1206  GLUCAP 138*   D-Dimer: No results for input(s): DDIMER in  the last 72 hours. Hgb A1c: No results for input(s): HGBA1C in the last 72 hours. Lipid Profile: No results for input(s): CHOL, HDL, LDLCALC, TRIG, CHOLHDL, LDLDIRECT in the last 72 hours. Thyroid function studies: No results for input(s): TSH, T4TOTAL, T3FREE, THYROIDAB in the last 72 hours.  Invalid input(s): FREET3 Anemia work up: No results for input(s): VITAMINB12, FOLATE, FERRITIN, TIBC, IRON , RETICCTPCT in the last 72 hours.  Sepsis Labs: Recent Labs  Lab 10/09/23 1332 10/10/23 0529 10/12/23 0659 10/13/23 0604 10/13/23 1144 10/14/23 0802  WBC  --    < > 7.9 7.2 9.4 6.5  LATICACIDVEN 2.7*  --   --   --   --   --    < > = values in this interval not displayed.   Microbiology Recent Results (from the past 240 hours)  Urine Culture     Status: Abnormal   Collection Time: 10/09/23  1:23 PM   Specimen: Urine, Random  Result Value Ref Range Status   Specimen Description   Final    URINE, RANDOM Performed at [facility], [address] [place], [place] [address]    Special Requests   Final    NONE Reflexed from [id number] Performed at [facility], [address], [place], [place] [address]    Culture 60,000 COLONIES/mL ENTEROCOCCUS FAECALIS (A)  Final   Report Status 10/12/2023 FINAL  Final   Organism ID, Bacteria ENTEROCOCCUS FAECALIS (A)  Final      Susceptibility   Enterococcus faecalis - MIC*    AMPICILLIN <=2 SENSITIVE Sensitive     NITROFURANTOIN <=16 SENSITIVE Sensitive     VANCOMYCIN  1 SENSITIVE Sensitive     * 60,000 COLONIES/mL  ENTEROCOCCUS FAECALIS  MRSA Next Gen by PCR, Nasal     Status: None   Collection Time: 10/10/23 10:46 AM   Specimen: Nasal Mucosa; Nasal Swab  Result Value Ref Range Status   MRSA by PCR Next Gen NOT DETECTED NOT DETECTED Final    Comment: (NOTE) The GeneXpert MRSA Assay (FDA approved for NASAL specimens only), is one component of a comprehensive MRSA colonization surveillance program. It is not intended to diagnose MRSA infection nor to guide or monitor treatment for MRSA infections. Test performance is not FDA approved in patients less than 31 years old. Performed at [facility], [address] [place], [place] [address]      Medications:    sodium chloride    Intravenous Once   sodium chloride    Intravenous Once   sodium chloride    Intravenous Once   atorvastatin   10 mg Oral Daily   calcitRIOL   1.25 mcg Oral Q M,W,F   calcium  carbonate  1 tablet Oral BID   Chlorhexidine  Gluconate Cloth  6 each Topical Q0600   cinacalcet   30 mg Oral Q M,W,F   latanoprost   1 drop Right Eye QHS   lipase/protease/amylase  72,000 Units Oral TID with meals   pantoprazole   40 mg Oral BID   QUEtiapine   200 mg Oral QHS   sertraline   200 mg Oral Daily   timolol   1 drop Right Eye BID   Continuous Infusions:  iron  sucrose 440 mL/hr at 10/14/23 0521      LOS: 3 days   Manuel Ortega  [facility]  10/14/2023, 10:11 AM

## 2023-10-14 NOTE — Progress Notes (Signed)
 Subjective: Feeling well.  No reports of any melena, hematochezia, or hematemesis.  Objective: Vital signs in last 24 hours: Temp:  [97.5 F (36.4 C)-99.4 F (37.4 C)] 98 F (36.7 C) (01/01 0718) Pulse Rate:  [76-96] 77 (01/01 0718) Resp:  [12-21] 18 (01/01 0718) BP: (100-137)/(56-80) 106/65 (01/01 0718) SpO2:  [98 %-100 %] 100 % (01/01 0718) Weight:  [86.4 kg] 86.4 kg (12/31 1657) Last BM Date : 10/12/23  Intake/Output from previous day: 12/31 0701 - 01/01 0700 In: 970.9 [I.V.:50; Blood:810.9; IV Piggyback:110] Out: 1000.4 [Urine:600] Intake/Output this shift: No intake/output data recorded.  General appearance: alert and no distress GI: soft, non-tender; bowel sounds normal; no masses,  no organomegaly  Lab Results: Recent Labs    10/13/23 0604 10/13/23 1144 10/14/23 0802  WBC 7.2 9.4 6.5  HGB 7.0* 7.7* 9.2*  HCT 20.7* 22.8* 26.9*  PLT 174 199 184   BMET Recent Labs    10/12/23 0659 10/13/23 0604 10/14/23 0802  NA 136 135 137  K 3.5 3.6 3.5  CL 102 102 102  CO2 23 22 26   GLUCOSE 97 88 95  BUN 61* 65* 26*  CREATININE 6.14* 7.40* 4.85*  CALCIUM  8.7* 8.9 8.5*   LFT Recent Labs    10/14/23 0802  ALBUMIN  3.0*   PT/INR No results for input(s): LABPROT, INR in the last 72 hours. Hepatitis Panel Recent Labs    10/11/23 2220  HEPBSAG NON REACTIVE   C-Diff No results for input(s): CDIFFTOX in the last 72 hours. Fecal Lactopherrin No results for input(s): FECLLACTOFRN in the last 72 hours.  Studies/Results: No results found.  Medications: Scheduled:  sodium chloride    Intravenous Once   sodium chloride    Intravenous Once   sodium chloride    Intravenous Once   atorvastatin   10 mg Oral Daily   calcitRIOL   1.25 mcg Oral Q M,W,F   calcium  carbonate  1 tablet Oral BID   Chlorhexidine  Gluconate Cloth  6 each Topical Q0600   cinacalcet   30 mg Oral Q M,W,F   latanoprost   1 drop Right Eye QHS   lipase/protease/amylase  72,000 Units Oral TID  with meals   pantoprazole   40 mg Oral BID   QUEtiapine   200 mg Oral QHS   sertraline   200 mg Oral Daily   timolol   1 drop Right Eye BID   Continuous:  iron  sucrose 440 mL/hr at 10/14/23 0521    Assessment/Plan: 1) Duodenal thickening. 2) Epigastric pain - nearly resolved. 3) Anemia - improved.   The pretest probability with his melena, epigastric pain, and the thickening on the CT scan is PUD.  His HGB improved from 7.7 up to 9.2 g/dL and he is hemodynamically stable.  On treatment with pantoprazole  he reports a near resolution of his epigastric pain.  Plan: 1) EGD tomorrow at 7:30 AM with Dr. Leigh. 2) Follow HGB and transfuse as necessary. 3) If his clinical status acutely changes an emergent EGD can be performed.  LOS: 3 days   Henri Guedes D 10/14/2023, 9:14 AM

## 2023-10-14 NOTE — H&P (View-Only) (Signed)
 Subjective: Feeling well.  No reports of any melena, hematochezia, or hematemesis.  Objective: Vital signs in last 24 hours: Temp:  [97.5 F (36.4 C)-99.4 F (37.4 C)] 98 F (36.7 C) (01/01 0718) Pulse Rate:  [76-96] 77 (01/01 0718) Resp:  [12-21] 18 (01/01 0718) BP: (100-137)/(56-80) 106/65 (01/01 0718) SpO2:  [98 %-100 %] 100 % (01/01 0718) Weight:  [86.4 kg] 86.4 kg (12/31 1657) Last BM Date : 10/12/23  Intake/Output from previous day: 12/31 0701 - 01/01 0700 In: 970.9 [I.V.:50; Blood:810.9; IV Piggyback:110] Out: 1000.4 [Urine:600] Intake/Output this shift: No intake/output data recorded.  General appearance: alert and no distress GI: soft, non-tender; bowel sounds normal; no masses,  no organomegaly  Lab Results: Recent Labs    10/13/23 0604 10/13/23 1144 10/14/23 0802  WBC 7.2 9.4 6.5  HGB 7.0* 7.7* 9.2*  HCT 20.7* 22.8* 26.9*  PLT 174 199 184   BMET Recent Labs    10/12/23 0659 10/13/23 0604 10/14/23 0802  NA 136 135 137  K 3.5 3.6 3.5  CL 102 102 102  CO2 23 22 26   GLUCOSE 97 88 95  BUN 61* 65* 26*  CREATININE 6.14* 7.40* 4.85*  CALCIUM  8.7* 8.9 8.5*   LFT Recent Labs    10/14/23 0802  ALBUMIN  3.0*   PT/INR No results for input(s): LABPROT, INR in the last 72 hours. Hepatitis Panel Recent Labs    10/11/23 2220  HEPBSAG NON REACTIVE   C-Diff No results for input(s): CDIFFTOX in the last 72 hours. Fecal Lactopherrin No results for input(s): FECLLACTOFRN in the last 72 hours.  Studies/Results: No results found.  Medications: Scheduled:  sodium chloride    Intravenous Once   sodium chloride    Intravenous Once   sodium chloride    Intravenous Once   atorvastatin   10 mg Oral Daily   calcitRIOL   1.25 mcg Oral Q M,W,F   calcium  carbonate  1 tablet Oral BID   Chlorhexidine  Gluconate Cloth  6 each Topical Q0600   cinacalcet   30 mg Oral Q M,W,F   latanoprost   1 drop Right Eye QHS   lipase/protease/amylase  72,000 Units Oral TID  with meals   pantoprazole   40 mg Oral BID   QUEtiapine   200 mg Oral QHS   sertraline   200 mg Oral Daily   timolol   1 drop Right Eye BID   Continuous:  iron  sucrose 440 mL/hr at 10/14/23 0521    Assessment/Plan: 1) Duodenal thickening. 2) Epigastric pain - nearly resolved. 3) Anemia - improved.   The pretest probability with his melena, epigastric pain, and the thickening on the CT scan is PUD.  His HGB improved from 7.7 up to 9.2 g/dL and he is hemodynamically stable.  On treatment with pantoprazole  he reports a near resolution of his epigastric pain.  Plan: 1) EGD tomorrow at 7:30 AM with Dr. Leigh. 2) Follow HGB and transfuse as necessary. 3) If his clinical status acutely changes an emergent EGD can be performed.  LOS: 3 days   Manuel Ortega D 10/14/2023, 9:14 AM

## 2023-10-14 NOTE — Plan of Care (Signed)
 Patient is alert and oriented x 4. Continue on room. Denies any chest pain, chest pressure or sob. Afebrile. No acute distress noted throughout shift. Safety precaution, bed lock in lowest position.  Call light place at reach. Problem: Education: Goal: Knowledge of General Education information will improve Description: Including pain rating scale, medication(s)/side effects and non-pharmacologic comfort measures Outcome: Progressing   Problem: Health Behavior/Discharge Planning: Goal: Ability to manage health-related needs will improve Outcome: Progressing   Problem: Clinical Measurements: Goal: Ability to maintain clinical measurements within normal limits will improve Outcome: Progressing Goal: Will remain free from infection Outcome: Progressing Goal: Diagnostic test results will improve Outcome: Progressing Goal: Respiratory complications will improve Outcome: Progressing Goal: Cardiovascular complication will be avoided Outcome: Progressing   Problem: Activity: Goal: Risk for activity intolerance will decrease Outcome: Progressing   Problem: Nutrition: Goal: Adequate nutrition will be maintained Outcome: Progressing   Problem: Coping: Goal: Level of anxiety will decrease Outcome: Progressing   Problem: Elimination: Goal: Will not experience complications related to bowel motility Outcome: Progressing Goal: Will not experience complications related to urinary retention Outcome: Progressing   Problem: Pain Management: Goal: General experience of comfort will improve Outcome: Progressing   Problem: Safety: Goal: Ability to remain free from injury will improve Outcome: Progressing   Problem: Skin Integrity: Goal: Risk for impaired skin integrity will decrease Outcome: Progressing

## 2023-10-14 NOTE — Progress Notes (Signed)
 Patient ID: Manuel Ortega, male   DOB: [birth year], 73 y.o.   MRN: [id number] S: Feels well, no complaints.  To have EGD tomorrow. O:BP 106/65 (BP Location: Right Arm)   Pulse 77   Temp 98 F (36.7 C)   Resp 18   Ht 6' 1 (1.854 m)   Wt 86.4 kg Comment: Taken via standing.  SpO2 100%   BMI 25.13 kg/m   Intake/Output Summary (Last 24 hours) at 10/14/2023 1048 Last data filed at 10/14/2023 0521 Gross per 24 hour  Intake 970.85 ml  Output 500.4 ml  Net 470.45 ml   Intake/Output: I/O last 3 completed shifts: In: 1710.9 [P.O.:240; I.V.:50; Blood:1310.9; IV Piggyback:110] Out: 1000.4 [Urine:600; Other:400.4]  Intake/Output this shift:  No intake/output data recorded. Weight change:  Gen: NAD CVS: RRR Resp: CTA Abd: +BS, soft, NT/ND Ext: no edema  Recent Labs  Lab 10/09/23 1323 10/10/23 0529 10/11/23 1024 10/12/23 0659 10/13/23 0604 10/14/23 0802  NA 138 131* 133* 136 135 137  K 4.4 3.6 3.4* 3.5 3.6 3.5  CL 101 101 96* 102 102 102  CO2 22 16* 25 23 22 26   GLUCOSE 97 127* 111* 97 88 95  BUN 99* 119* 54* 61* 65* 26*  CREATININE 7.64* 7.90* 4.75* 6.14* 7.40* 4.85*  ALBUMIN  3.4*  --  3.6 3.3* 3.0* 3.0*  CALCIUM  10.0 8.8* 9.0 8.7* 8.9 8.5*  PHOS  --   --  2.1* 4.3 5.6* 4.2  AST 15  --   --   --   --   --   ALT 14  --   --   --   --   --    Liver Function Tests: Recent Labs  Lab 10/09/23 1323 10/11/23 1024 10/12/23 0659 10/13/23 0604 10/14/23 0802  AST 15  --   --   --   --   ALT 14  --   --   --   --   ALKPHOS 62  --   --   --   --   BILITOT 0.4  --   --   --   --   PROT 6.4*  --   --   --   --   ALBUMIN  3.4*   < > 3.3* 3.0* 3.0*   < > = values in this interval not displayed.   No results for input(s): LIPASE, AMYLASE in the last 168 hours. No results for input(s): AMMONIA in the last 168 hours. CBC: Recent Labs  Lab 10/09/23 1323 10/10/23 0529 10/11/23 1024 10/12/23 0659 10/13/23 0604 10/13/23 1144 10/14/23 0802  WBC 10.2   < > 12.1* 7.9 7.2 9.4  6.5  NEUTROABS 7.7  --   --   --   --   --   --   HGB 8.2*   < > 7.0* 6.3* 7.0* 7.7* 9.2*  HCT 24.6*   < > 20.4* 19.6* 20.7* 22.8* 26.9*  MCV 91.8   < > 87.2 90.3 88.8 89.1 87.9  PLT 277   < > 187 175 174 199 184   < > = values in this interval not displayed.   Cardiac Enzymes: No results for input(s): CKTOTAL, CKMB, CKMBINDEX, TROPONINI in the last 168 hours. CBG: Recent Labs  Lab 10/09/23 1206  GLUCAP 138*    Iron  Studies: No results for input(s): IRON , TIBC, TRANSFERRIN, FERRITIN in the last 72 hours. Studies/Results: No results found.  sodium chloride    Intravenous Once   sodium chloride    Intravenous Once  sodium chloride    Intravenous Once   atorvastatin   10 mg Oral Daily   calcitRIOL   1.25 mcg Oral Q M,W,F   calcium  carbonate  1 tablet Oral BID   Chlorhexidine  Gluconate Cloth  6 each Topical Q0600   cinacalcet   30 mg Oral Q M,W,F   latanoprost   1 drop Right Eye QHS   lipase/protease/amylase  72,000 Units Oral TID with meals   pantoprazole   40 mg Oral BID   QUEtiapine   200 mg Oral QHS   sertraline   200 mg Oral Daily   timolol   1 drop Right Eye BID    BMET    Component Value Date/Time   NA 137 10/14/2023 0802   K 3.5 10/14/2023 0802   CL 102 10/14/2023 0802   CO2 26 10/14/2023 0802   GLUCOSE 95 10/14/2023 0802   BUN 26 (H) 10/14/2023 0802   CREATININE 4.85 (H) 10/14/2023 0802   CALCIUM  8.5 (L) 10/14/2023 0802   GFRNONAA 12 (L) 10/14/2023 0802   GFRAA 7 (L) 06/12/2020 0644   CBC    Component Value Date/Time   WBC 6.5 10/14/2023 0802   RBC 3.06 (L) 10/14/2023 0802   HGB 9.2 (L) 10/14/2023 0802   HCT 26.9 (L) 10/14/2023 0802   PLT 184 10/14/2023 0802   MCV 87.9 10/14/2023 0802   MCH 30.1 10/14/2023 0802   MCHC 34.2 10/14/2023 0802   RDW 17.2 (H) 10/14/2023 0802   LYMPHSABS 1.3 10/09/2023 1323   MONOABS 0.9 10/09/2023 1323   EOSABS 0.2 10/09/2023 1323   BASOSABS 0.0 10/09/2023 1323    NW-  TTS 4 hours-   400 BFR TDC 2K, heparin   yes-  hgb was 10.2 on 12/17  mircera 30 q 2 weeks, venofer  50 q week, calcitriol  1.25 tts, sensipar  30 tts EDW 90.3   Assessment/ Plan: Pt is a 73 y.o. yo male with ESRD who was admitted on 10/09/2023 with SOB, DOE, pre syncope and missed HD   Assessment/Plan: 1. SOB/DOE/presyncope-  in retrospect think was more due to low hgb than volume overload-  better with transfusion.  Chest CT no PE- a few small things that will need to be followed up on -  lung nodules 2. ESRD -  normally TTS-  has not been compliant-  Was ordered to have HD yesterday with blood but was missed on schedule.  To have HD tomorrow after EGD and continue with his outpatient schedule of TTS.  3. Anemia- not sure if low hgb only due to missed HD and missed ESA -  hgb was 10.2 on 12/17-  no symptoms of bleeding -  is on PPI.  For more transfusions today with HD.  Hgb 6.3 improved to 7 and to 9.2 today after transfusion yesterday with HD.  GI Following and planning for EGD tomorrow.  Suspect PUD.  4. Secondary hyperparathyroidism-  continue with calcitriol , sensipar - not worry about binder right now  5. HTN/volume- low BP, no meds- below edw.  Still urinates.  Had 400 mL UF with HD.    Fairy RONAL Sellar, MD [employer] [phone]

## 2023-10-14 NOTE — Anesthesia Preprocedure Evaluation (Addendum)
 Anesthesia Evaluation  Patient identified by MRN, date of birth, ID band Patient awake    Reviewed: Allergy & Precautions, NPO status , Patient's Chart, lab work & pertinent test results  History of Anesthesia Complications Negative for: history of anesthetic complications  Airway Mallampati: III  TM Distance: >3 FB Neck ROM: Full    Dental  (+) Teeth Intact, Dental Advisory Given   Pulmonary  Snores at night, has never been tested for OSA   Pulmonary exam normal breath sounds clear to auscultation       Cardiovascular hypertension (126/77 preop), Normal cardiovascular exam+ Valvular Problems/Murmurs (mild AI) AI  Rhythm:Regular Rate:Normal  TTE 06/25/23: EF 55-60%, mild asymmetric LVH of septal segment, mild AR    Neuro/Psych  PSYCHIATRIC DISORDERS Anxiety Depression       GI/Hepatic Neg liver ROS,,,melena   Endo/Other  negative endocrine ROS    Renal/GU ESRF and DialysisRenal diseasePoor compliance with HD, was supposed to be dialyzed yesterday but unable to be fit into schedule Still making urine per pt K 3.9     Musculoskeletal  (+) Arthritis ,    Abdominal   Peds  Hematology  (+) Blood dyscrasia (Hgb 9.2), anemia Hb 9.2, plt 184- initially hb 6 on admission   Anesthesia Other Findings glaucoma  Reproductive/Obstetrics negative OB ROS                             Anesthesia Physical Anesthesia Plan  ASA: 3  Anesthesia Plan: MAC   Post-op Pain Management: Minimal or no pain anticipated   Induction:   PONV Risk Score and Plan: 2 and Treatment may vary due to age or medical condition and Propofol  infusion  Airway Management Planned: Natural Airway and Nasal Cannula  Additional Equipment: None  Intra-op Plan:   Post-operative Plan:   Informed Consent: I have reviewed the patients History and Physical, chart, labs and discussed the procedure including the risks, benefits and  alternatives for the proposed anesthesia with the patient or authorized representative who has indicated his/her understanding and acceptance.       Plan Discussed with: CRNA  Anesthesia Plan Comments:         Anesthesia Quick Evaluation

## 2023-10-14 NOTE — Plan of Care (Signed)

## 2023-10-15 ENCOUNTER — Inpatient Hospital Stay (HOSPITAL_COMMUNITY): Payer: Medicare Other | Admitting: Anesthesiology

## 2023-10-15 ENCOUNTER — Encounter (HOSPITAL_COMMUNITY)
Admission: EM | Disposition: A | Discharge status: Home / Self Care | Payer: Self-pay | Source: Home / Self Care | Attending: Internal Medicine

## 2023-10-15 ENCOUNTER — Encounter (HOSPITAL_COMMUNITY): Payer: Self-pay | Admitting: Internal Medicine

## 2023-10-15 DIAGNOSIS — N189 Chronic kidney disease, unspecified: Secondary | ICD-10-CM | POA: Diagnosis not present

## 2023-10-15 DIAGNOSIS — K269 Duodenal ulcer, unspecified as acute or chronic, without hemorrhage or perforation: Secondary | ICD-10-CM

## 2023-10-15 DIAGNOSIS — K2950 Unspecified chronic gastritis without bleeding: Secondary | ICD-10-CM | POA: Diagnosis not present

## 2023-10-15 DIAGNOSIS — K279 Peptic ulcer, site unspecified, unspecified as acute or chronic, without hemorrhage or perforation: Secondary | ICD-10-CM

## 2023-10-15 DIAGNOSIS — R55 Syncope and collapse: Secondary | ICD-10-CM | POA: Diagnosis not present

## 2023-10-15 DIAGNOSIS — I120 Hypertensive chronic kidney disease with stage 5 chronic kidney disease or end stage renal disease: Secondary | ICD-10-CM | POA: Diagnosis not present

## 2023-10-15 DIAGNOSIS — K317 Polyp of stomach and duodenum: Secondary | ICD-10-CM | POA: Diagnosis not present

## 2023-10-15 DIAGNOSIS — K3189 Other diseases of stomach and duodenum: Secondary | ICD-10-CM

## 2023-10-15 DIAGNOSIS — N186 End stage renal disease: Secondary | ICD-10-CM | POA: Diagnosis not present

## 2023-10-15 DIAGNOSIS — K449 Diaphragmatic hernia without obstruction or gangrene: Secondary | ICD-10-CM

## 2023-10-15 DIAGNOSIS — R1084 Generalized abdominal pain: Secondary | ICD-10-CM | POA: Diagnosis not present

## 2023-10-15 DIAGNOSIS — K2980 Duodenitis without bleeding: Secondary | ICD-10-CM

## 2023-10-15 DIAGNOSIS — R339 Retention of urine, unspecified: Secondary | ICD-10-CM | POA: Diagnosis not present

## 2023-10-15 HISTORY — PX: BIOPSY: SHX5522

## 2023-10-15 HISTORY — PX: ESOPHAGOGASTRODUODENOSCOPY (EGD) WITH PROPOFOL: SHX5813

## 2023-10-15 LAB — RENAL FUNCTION PANEL
Albumin: 3.5 g/dL (ref 3.5–5.0)
Anion gap: 10 (ref 5–15)
BUN: 10 mg/dL (ref 8–23)
CO2: 25 mmol/L (ref 22–32)
Calcium: 8.8 mg/dL — ABNORMAL LOW (ref 8.9–10.3)
Chloride: 99 mmol/L (ref 98–111)
Creatinine, Ser: 3.55 mg/dL — ABNORMAL HIGH (ref 0.61–1.24)
GFR, Estimated: 18 mL/min — ABNORMAL LOW (ref >60)
Glucose, Bld: 115 mg/dL — ABNORMAL HIGH (ref 70–99)
Phosphorus: 2 mg/dL — ABNORMAL LOW (ref 2.5–4.6)
Potassium: 3.5 mmol/L (ref 3.5–5.1)
Sodium: 134 mmol/L — ABNORMAL LOW (ref 135–145)

## 2023-10-15 LAB — POCT I-STAT, CHEM 8
BUN: 30 mg/dL — ABNORMAL HIGH (ref 8–23)
Calcium, Ion: 1.14 mmol/L — ABNORMAL LOW (ref 1.15–1.40)
Chloride: 102 mmol/L (ref 98–111)
Creatinine, Ser: 7.1 mg/dL — ABNORMAL HIGH (ref 0.61–1.24)
Glucose, Bld: 89 mg/dL (ref 70–99)
HCT: 30 % — ABNORMAL LOW (ref 39.0–52.0)
Hemoglobin: 10.2 g/dL — ABNORMAL LOW (ref 13.0–17.0)
Potassium: 3.9 mmol/L (ref 3.5–5.1)
Sodium: 136 mmol/L (ref 135–145)
TCO2: 24 mmol/L (ref 22–32)

## 2023-10-15 LAB — CBC
HCT: 31.6 % — ABNORMAL LOW (ref 39.0–52.0)
Hemoglobin: 10.6 g/dL — ABNORMAL LOW (ref 13.0–17.0)
MCH: 30 pg (ref 26.0–34.0)
MCHC: 33.5 g/dL (ref 30.0–36.0)
MCV: 89.5 fL (ref 80.0–100.0)
Platelets: 220 10*3/uL (ref 150–400)
RBC: 3.53 MIL/uL — ABNORMAL LOW (ref 4.22–5.81)
RDW: 17.8 % — ABNORMAL HIGH (ref 11.5–15.5)
WBC: 8.8 10*3/uL (ref 4.0–10.5)
nRBC: 0 % (ref 0.0–0.2)

## 2023-10-15 SURGERY — ESOPHAGOGASTRODUODENOSCOPY (EGD) WITH PROPOFOL
Anesthesia: Monitor Anesthesia Care

## 2023-10-15 MED ORDER — HEPARIN SODIUM (PORCINE) 1000 UNIT/ML IJ SOLN: 2000.0000 [IU] | Freq: Once | INTRAMUSCULAR | Status: DC

## 2023-10-15 MED ORDER — PANTOPRAZOLE SODIUM 40 MG PO TBEC: 40.0000 mg | DELAYED_RELEASE_TABLET | Freq: Two times a day (BID) | ORAL | 1 refills | Status: DC

## 2023-10-15 MED ORDER — PHENYLEPHRINE 80 MCG/ML (10ML) SYRINGE FOR IV PUSH (FOR BLOOD PRESSURE SUPPORT)
PREFILLED_SYRINGE | INTRAVENOUS | Status: DC | PRN
  Administered 2023-10-15 (×2): 80 ug via INTRAVENOUS

## 2023-10-15 MED ORDER — PROPOFOL 500 MG/50ML IV EMUL
INTRAVENOUS | Status: DC | PRN
  Administered 2023-10-15: 100 ug/kg/min via INTRAVENOUS

## 2023-10-15 MED ORDER — SODIUM CHLORIDE 0.9 % IV SOLN
200.0000 mg | INTRAVENOUS | Status: DC
  Administered 2023-10-15: 200 mg via INTRAVENOUS
  Filled 2023-10-15: qty 10

## 2023-10-15 MED ORDER — PROPOFOL 10 MG/ML IV BOLUS
INTRAVENOUS | Status: DC | PRN
  Administered 2023-10-15: 25 mg via INTRAVENOUS
  Administered 2023-10-15: 70 mg via INTRAVENOUS
  Administered 2023-10-15: 25 mg via INTRAVENOUS
  Administered 2023-10-15: 20 mg via INTRAVENOUS

## 2023-10-15 MED ORDER — SODIUM CHLORIDE 0.9 % IV SOLN
INTRAVENOUS | Status: DC | PRN
Start: 2023-10-15 — End: 2023-10-15

## 2023-10-15 MED ORDER — HEPARIN SODIUM (PORCINE) 1000 UNIT/ML IJ SOLN
3200.0000 [IU] | Freq: Once | INTRAMUSCULAR | Status: AC
  Administered 2023-10-15: 3200 [IU]

## 2023-10-15 MED ORDER — LIDOCAINE 2% (20 MG/ML) 5 ML SYRINGE
INTRAMUSCULAR | Status: DC | PRN
  Administered 2023-10-15: 100 mg via INTRAVENOUS

## 2023-10-15 MED ORDER — HEPARIN SODIUM (PORCINE) 1000 UNIT/ML IJ SOLN
INTRAMUSCULAR | Status: AC
  Filled 2023-10-15: qty 10

## 2023-10-15 SURGICAL SUPPLY — 14 items

## 2023-10-15 NOTE — Progress Notes (Signed)
 D/C order noted. Pt to d/c after HD per renal notes. Contacted [facility] to be advised that pt will d/c today and should resume care on Saturday.   Olivia Canter Renal Navigator [phone]

## 2023-10-15 NOTE — Plan of Care (Signed)

## 2023-10-15 NOTE — Discharge Summary (Signed)
 Physician Discharge Summary  Manuel Ortega FMW:[id number] DOB: 1951-05-10 DOA: 10/09/2023  PCP: Valma Lannie LABOR, PA-C  Admit date: 10/09/2023 Discharge date: 10/15/2023  Admitted From: Home Disposition:  Home  Recommendations for Outpatient Follow-up:  Follow up with PCP in 1-2 weeks, repeat a CT of the chest to reevaluate 7 mm pulmonary nodule seen on CT on October 12, 2023  Please obtain BMP/CBC in one week   Home Health:No Equipment/Devices:None  Discharge Condition:Stable CODE STATUS:Full Diet recommendation: Heart Healthy   Brief/Interim Summary: 73 y.o. male past medical history of end-stage renal disease on hemodialysis Tuesday Thursdays and Saturdays, prostate cancer status post TURP, hyperparathyroidism, anemia of chronic disease, pancreatic insufficiency BPH who comes in for a near syncopal episode he relates decreased appetite, he relates he got up and started walking and felt short of breath the ED was found tachycardic tachypneic and satting greater 90% on room air. He has missed 2 of his previous dialysis afebrile with no leukocytosis.  CT angio of the chest abdomen and pelvis showed slight thickening of the proximal duodenum, numerous small nonpathologic lymph nodes, small cyst in the pancreas.  Discharge Diagnoses:  Principal Problem:   Pre-syncope Active Problems:   Essential hypertension   Anemia in chronic kidney disease   ESRD on dialysis [facility])   Prostate cancer (HCC)   Pulmonary nodule   Dyspnea on exertion   Anxiety   Pancreatic cyst   SOB (shortness of breath)   Acute blood loss anemia   Melanotic stools   Mucosal abnormality of stomach   Gastric polyp   Duodenitis   Duodenal ulcer  Presyncope/volume overload: Likely due to noncompliance with his dialysis. Orthostatics were positive on admission. Infectious workup was negative. CT of the chest showed no acute findings. 2D echo showed a preserved EF, we were able to wean him to room air. Likely  the cause of his presyncope was acute blood loss anemia through the GI tract.  Acute blood loss anemia/melanotic stools likely due to peptic ulcer disease: He was started on Protonix  on admission GI was consulted they recommended an EGD. He had to be transfused 2 units of packed red blood cells his hemoglobin came up to 9. EGD showed peptic ulcer disease with duodenitis. He will continue Protonix  twice a day will follow-up with GI as an outpatient.  Central hypertension: No changes made to his medication.  Incidental pancreatic pseudocyst/pancreatic insufficiency: Currently asymptomatic continue current medication.  Anxiety: Patient is quite anxious patient. He was continued on sertraline .  Incidental pulmonary nodule: Has a 7 mm pulmonary nodule on CT will need follow-up 3 to 6 months as an outpatient. Will notify PCP.  History of prostate cancer: Status post TURP noted.  End-stage renal disease on hemodialysis Tuesday Thursdays and Saturdays: Nephrology was consulted he was continued on his regular scheduled dialysis. No changes made to his medication.    Discharge Instructions  Discharge Instructions     Diet - low sodium heart healthy   Complete by: As directed    Increase activity slowly   Complete by: As directed       Allergies as of 10/15/2023   No Known Allergies      Medication List     STOP taking these medications    docusate sodium  100 MG capsule Commonly known as: COLACE   FLUoxetine  40 MG capsule Commonly known as: PROZAC    magnesium  oxide 400 (241.3 Mg) MG tablet Commonly known as: MAG-OX   melatonin 5 MG Tabs   tamsulosin   0.4 MG Caps capsule Commonly known as: FLOMAX        TAKE these medications    azelastine 0.1 % nasal spray Commonly known as: ASTELIN Place 1 spray into both nostrils 2 (two) times daily as needed for rhinitis or allergies.   calcium  acetate 667 MG capsule Commonly known as: PHOSLO  Take 667-1,334 mg by mouth  See admin instructions. Take 1,334 mg by mouth once a day and 667 mg in the evening   clotrimazole -betamethasone  cream Commonly known as: LOTRISONE  APPLY 1 APPLICATION TOPICALLY DAILY   Creon  36000-114000 units Cpep capsule Generic drug: lipase/protease/amylase Take 36,000-72,000 Units by mouth See admin instructions. Take 72,000 units by mouth three time a day with meals and 36,000 units three times a day with snacks   diphenhydrAMINE  25 mg capsule Commonly known as: BENADRYL  Take 25 mg by mouth every 4 (four) hours as needed for itching or allergies.   ipratropium 0.03 % nasal spray Commonly known as: ATROVENT Place 2 sprays into both nostrils every 12 (twelve) hours as needed for rhinitis.   ketoconazole 2 % cream Commonly known as: NIZORAL Apply 1 Application topically See admin instructions. Apply to affected toe 2 times a day   Lumigan  0.01 % Soln Generic drug: bimatoprost  Place 1 drop into both eyes at bedtime.   mirtazapine  15 MG tablet Commonly known as: REMERON  Take 15 mg by mouth at bedtime.   montelukast  10 MG tablet Commonly known as: SINGULAIR  Take 10 mg by mouth at bedtime.   ondansetron  8 MG disintegrating tablet Commonly known as: ZOFRAN -ODT Take 8 mg by mouth every 8 (eight) hours as needed for nausea or vomiting (dissolve orally).   pantoprazole  40 MG tablet Commonly known as: PROTONIX  Take 1 tablet (40 mg total) by mouth 2 (two) times daily.   QUEtiapine  100 MG tablet Commonly known as: SEROQUEL  Take 200 mg by mouth at bedtime.   sertraline  100 MG tablet Commonly known as: ZOLOFT  Take 200 mg by mouth in the morning.   sildenafil 100 MG tablet Commonly known as: VIAGRA Take 100 mg by mouth daily as needed for erectile dysfunction.   timolol  0.25 % ophthalmic solution Commonly known as: BETIMOL  Place 1 drop into both eyes in the morning.   Tums 500 MG chewable tablet Generic drug: calcium  carbonate Chew 1 tablet by mouth 3 (three) times  daily as needed for indigestion or heartburn.   TYLENOL  500 MG tablet Generic drug: acetaminophen  Take 500-1,000 mg by mouth every 6 (six) hours as needed for mild pain (pain score 1-3) or headache.   zolpidem  10 MG tablet Commonly known as: AMBIEN  Take 10 mg by mouth at bedtime as needed for sleep.        Follow-up Information     Schedule an appointment as soon as possible for a visit  with Connect with your PCP/Specialist as discussed.   Contact information: [email or web address] Call our physician referral line at [phone].               No Known Allergies  Consultations: Nephrology Gastroenterology   Procedures/Studies: CT ANGIO CHEST/ABD/PEL FOR DISSECTION W &/OR WO CONTRAST Result Date: 10/09/2023 CLINICAL DATA:  Near-syncope with pain and nausea. EXAM: CT ANGIOGRAPHY CHEST, ABDOMEN AND PELVIS TECHNIQUE: Non-contrast CT of the chest was initially obtained. Multidetector CT imaging through the chest, abdomen and pelvis was performed using the standard protocol during bolus administration of intravenous contrast. Multiplanar reconstructed images and MIPs were obtained and reviewed to evaluate the vascular anatomy. RADIATION DOSE REDUCTION: This  exam was performed according to the departmental dose-optimization program which includes automated exposure control, adjustment of the mA and/or kV according to patient size and/or use of iterative reconstruction technique. CONTRAST:  OMNIPAQUE  IOHEXOL  350 MG/ML SOLN COMPARISON:  Chest x-ray 10/09/2023 earlier. Abdomen and pelvis CT without contrast 05/11/2020. No prior chest CT. FINDINGS: CTA CHEST FINDINGS Cardiovascular: On the noncontrast dataset there is no curvilinear high density along the course of the thoracic aorta. Postcontrast images are late for the arterial phase. Appearance is more nephrographic phase or late portal venous phase limiting evaluation for subtle arterial abnormality. There  is scattered atherosclerotic calcified plaque along the thoracic aorta, mild. No clear evidence of dissection or aneurysm formation. Diameter of the ascending aorta at the level of the right pulmonary artery approaches 3.3 cm. There is pulsation artifact. The descending thoracic aorta same level measures 2.8 by 2.7 cm. Diameter of the aortic root approaches 3.1 cm and distal aortic arch 3.0 cm. The origin of the great vessels are grossly preserved. Scattered calcified plaque identified along the great vessels proximally. The heart is nonenlarged. Trace pericardial fluid. There is a right IJ catheter in place with tip extending to the SVC right atrial junction region. Mediastinum/Nodes: Patulous esophagus. Small thyroid gland. No specific abnormal lymph node enlargement identified in the axillary regions, hilum or mediastinum. Lungs/Pleura: No consolidation, pneumothorax or effusion. Minimal areas of scarring atelectatic changes identified. Mild lower lung bronchiectasis on the right. There is a calcified nodule right lower lobe on series 6, image 104 consistent with old granulomatous disease. Slight basilar atelectasis or scar as well. Noncalcified right lower lobe lung nodule measures 7 mm on series 6, image 93. 7 mm left lower lobe lung nodule noncalcified series 6, image 77 as well. Musculoskeletal: Mild degenerative changes along the spine. Review of the MIP images confirms the above findings. CTA ABDOMEN AND PELVIS FINDINGS VASCULAR. Contrast bolus is more in the late portal venous phase early nephrographic phase limiting evaluation for subtle vascular abnormality. Aorta: Mild atherosclerotic calcified plaque. No dissection or aneurysm formation. Celiac: Standard branching pattern of the celiac axis. No significant stenosis. Mild atherosclerotic plaque. SMA: Mild partially calcified atherosclerotic plaque proximally and along the midportion of the vessel. No significant stenosis centrally. Renals: Mild  calcified plaque along the proximal aspects of both renal arteries. Single bilateral main renal arteries are identified. IMA: Patent without evidence of aneurysm, dissection, vasculitis or significant stenosis. Inflow: The iliac vessels are grossly preserved in caliber. Scattered calcified mild plaque identified. No significant stenosis. Veins: No obvious venous abnormality within the limitations of this arterial phase study. Review of the MIP images confirms the above findings. NON-VASCULAR Hepatobiliary: Stable low-attenuation dome lesion on series 10, image 118 measuring 14 mm. Not clearly a simple cyst. Gallbladder is nondilated. Patent portal vein. Pancreas: Mild pancreatic atrophy. There is small cystic area in the uncinate process with some dystrophic calcification measuring 13 mm. Second smaller focus towards the tail on series 10, image 150 measuring 7 mm. Third lesion in the body on coronal series 12, image 64. Recommend further evaluation. Spleen: Normal in size without focal abnormality. Adrenals/Urinary Tract: Stable nodular thickening of both adrenal glands. Moderate atrophy of the kidneys. No collecting system dilatation. The ureters have normal course and caliber extending down to the urinary bladder. Preserved contours of the urinary bladder. Bladder wall slightly thickened and trabeculated. Bilateral Bosniak 1 renal cysts are identified, left-greater-than-right. Some smaller Bosniak 2 lesions as well. No significant imaging follow-up of these renal  lesions at this time. Stomach/Bowel: On this non oral contrast exam large bowel has a normal course and caliber with some scattered stool. Colonic diverticulosis seen particularly along the left side. Normal appendix extends medial to the cecum in the right lower quadrant. There is moderate fluid and debris along the stomach. Slight fold thickening suggested along the proximal duodenal such as series 10, image 168. There is some prominent adjacent lymph  nodes as well. Please correlate with symptoms. Otherwise the small bowel is nondilated. Several loops distally are fluid-filled. Lymphatic: Few prominent lymph nodes in the central mesentery near the pancreas and duodenal. These not pathologic by size criteria. No abnormal lymph nodes in the abdomen and pelvis. Reproductive: Prominent prostate. Mass effect along the base of the bladder. Please correlate for any history of the TURP. Other: Mild anasarca. Musculoskeletal: Mild degenerative changes along the spine and pelvis. Trace anterolisthesis of L4 on L5. Multilevel lumbar spine mild disc bulging. Review of the MIP images confirms the above findings. IMPRESSION: Evaluation limited due to the timing of the contrast bolus, more late portal venous phase. Scattered calcified atherosclerotic plaque diffusely without clear evidence of dissection or aneurysm formation. Bilateral renal atrophy.  Benign appearing renal cystic foci. Colonic diverticulosis.  No bowel obstruction. There is slight wall thickening along the proximal duodenal with some small adjacent nodes which are not pathologic by size criteria but more numerous than usually seen. Please correlate with any symptoms and further workup of the duodenal when clinically appropriate Small cystic areas along the pancreas. Based on size and appearance please correlate with any prior to assess for long-term stability. However these are not seen in 2021 noncontrast CT. Otherwise recommend dedicated MRI evaluation when clinically appropriate. Noncalcified bilateral lung nodules measuring up to 7 mm, 1 on each side. There are some separate calcified foci. Please correlate with any prior to assess for stability otherwise Non-contrast chest CT at 3-6 months is recommended. If the nodules are stable at time of repeat CT, then future CT at 18-24 months (from today's scan) is considered optional for low-risk patients, but is recommended for high-risk patients. This  recommendation follows the consensus statement: Guidelines for Management of Incidental Pulmonary Nodules Detected on CT Images: From the Fleischner Society 2017; Radiology 2017; 284:228-243. Distended urinary bladder with an enlarged prostate. Bladder wall thickening and trabeculation. Electronically Signed   By: Ranell Bring M.D.   On: 10/09/2023 18:56   DG Chest 2 View Result Date: 10/09/2023 CLINICAL DATA:  Suspected sepsis. Abdominal pain. Generalized weakness. EXAM: CHEST - 2 VIEW COMPARISON:  09/19/2023. FINDINGS: Low lung volume. Bilateral lung fields are clear. Bilateral costophrenic angles are clear. Normal cardio-mediastinal silhouette. No acute osseous abnormalities. The soft tissues are within normal limits. Right IJ hemodialysis catheter noted with its tip overlying the lower portion of superior vena cava. IMPRESSION: No active cardiopulmonary disease. Electronically Signed   By: Ree Molt M.D.   On: 10/09/2023 12:44   DG Chest Portable 1 View Result Date: 09/19/2023 CLINICAL DATA:  Weakness with dizziness and lower extremity swelling. Patient reports missing to dialysis appointments. EXAM: PORTABLE CHEST 1 VIEW COMPARISON:  Radiographs 06/10/2020. FINDINGS: 1556 hours. Right IJ hemodialysis catheter projects to the level of the upper right atrium. The heart size and mediastinal contours are stable. The lungs remain clear. There is no edema, pleural effusion or pneumothorax. The bones appear unremarkable. Telemetry leads overlie the chest. IMPRESSION: No evidence of acute cardiopulmonary process. Right IJ hemodialysis catheter in place. Electronically Signed  By: Elsie Perone M.D.   On: 09/19/2023 16:15     Subjective: No complaints  Discharge Exam: Vitals:   10/15/23 0815 10/15/23 0848  BP: 109/67 139/85  Pulse: 71 74  Resp: 17 20  Temp:  (!) 97.5 F (36.4 C)  SpO2: 100% 100%   Vitals:   10/15/23 0800 10/15/23 0810 10/15/23 0815 10/15/23 0848  BP: 100/61 111/64  109/67 139/85  Pulse: 75 72 71 74  Resp: (!) 21 18 17 20   Temp:    (!) 97.5 F (36.4 C)  TempSrc:    Oral  SpO2: 99% 99% 100% 100%  Weight:      Height:        General: Pt is alert, awake, not in acute distress Cardiovascular: RRR, S1/S2 +, no rubs, no gallops Respiratory: CTA bilaterally, no wheezing, no rhonchi Abdominal: Soft, NT, ND, bowel sounds + Extremities: no edema, no cyanosis    The results of significant diagnostics from this hospitalization (including imaging, microbiology, ancillary and laboratory) are listed below for reference.     Microbiology: Recent Results (from the past 240 hours)  Urine Culture     Status: Abnormal   Collection Time: 10/09/23  1:23 PM   Specimen: Urine, Random  Result Value Ref Range Status   Specimen Description   Final    URINE, RANDOM Performed at [facility], [address] [place], [place] [address]    Special Requests   Final    NONE Reflexed from [id number] Performed at [facility], [address] [place], [place] [address]    Culture 60,000 COLONIES/mL ENTEROCOCCUS FAECALIS (A)  Final   Report Status 10/12/2023 FINAL  Final   Organism ID, Bacteria ENTEROCOCCUS FAECALIS (A)  Final      Susceptibility   Enterococcus faecalis - MIC*    AMPICILLIN <=2 SENSITIVE Sensitive     NITROFURANTOIN <=16 SENSITIVE Sensitive     VANCOMYCIN  1 SENSITIVE Sensitive     * 60,000 COLONIES/mL ENTEROCOCCUS FAECALIS  MRSA Next Gen by PCR, Nasal     Status: None   Collection Time: 10/10/23 10:46 AM   Specimen: Nasal Mucosa; Nasal Swab  Result Value Ref Range Status   MRSA by PCR Next Gen NOT DETECTED NOT DETECTED Final    Comment: (NOTE) The GeneXpert MRSA Assay (FDA approved for NASAL specimens only), is one component of a comprehensive MRSA colonization surveillance program. It is not intended to diagnose MRSA infection nor to guide or monitor treatment for MRSA infections. Test performance is not FDA  approved in patients less than 80 years old. Performed at [facility], [address], [place], [place] [address]      Labs: BNP (last 3 results) No results for input(s): BNP in the last 8760 hours. Basic Metabolic Panel: Recent Labs  Lab 10/10/23 0529 10/11/23 1024 10/12/23 0659 10/13/23 0604 10/14/23 0802 10/15/23 0704  NA 131* 133* 136 135 137 136  K 3.6 3.4* 3.5 3.6 3.5 3.9  CL 101 96* 102 102 102 102  CO2 16* 25 23 22 26   --   GLUCOSE 127* 111* 97 88 95 89  BUN 119* 54* 61* 65* 26* 30*  CREATININE 7.90* 4.75* 6.14* 7.40* 4.85* 7.10*  CALCIUM  8.8* 9.0 8.7* 8.9 8.5*  --   PHOS  --  2.1* 4.3 5.6* 4.2  --    Liver Function Tests: Recent Labs  Lab 10/09/23 1323 10/11/23 1024 10/12/23 0659 10/13/23 0604 10/14/23 0802  AST 15  --   --   --   --  ALT 14  --   --   --   --   ALKPHOS 62  --   --   --   --   BILITOT 0.4  --   --   --   --   PROT 6.4*  --   --   --   --   ALBUMIN  3.4* 3.6 3.3* 3.0* 3.0*   No results for input(s): LIPASE, AMYLASE in the last 168 hours. No results for input(s): AMMONIA in the last 168 hours. CBC: Recent Labs  Lab 10/09/23 1323 10/10/23 0529 10/11/23 1024 10/12/23 0659 10/13/23 0604 10/13/23 1144 10/14/23 0802 10/15/23 0704  WBC 10.2   < > 12.1* 7.9 7.2 9.4 6.5  --   NEUTROABS 7.7  --   --   --   --   --   --   --   HGB 8.2*   < > 7.0* 6.3* 7.0* 7.7* 9.2* 10.2*  HCT 24.6*   < > 20.4* 19.6* 20.7* 22.8* 26.9* 30.0*  MCV 91.8   < > 87.2 90.3 88.8 89.1 87.9  --   PLT 277   < > 187 175 174 199 184  --    < > = values in this interval not displayed.   Cardiac Enzymes: No results for input(s): CKTOTAL, CKMB, CKMBINDEX, TROPONINI in the last 168 hours. BNP: Invalid input(s): POCBNP CBG: Recent Labs  Lab 10/09/23 1206  GLUCAP 138*   D-Dimer No results for input(s): DDIMER in the last 72 hours. Hgb A1c No results for input(s): HGBA1C in the last 72 hours. Lipid Profile No results for input(s):  CHOL, HDL, LDLCALC, TRIG, CHOLHDL, LDLDIRECT in the last 72 hours. Thyroid function studies No results for input(s): TSH, T4TOTAL, T3FREE, THYROIDAB in the last 72 hours.  Invalid input(s): FREET3 Anemia work up No results for input(s): VITAMINB12, FOLATE, FERRITIN, TIBC, IRON , RETICCTPCT in the last 72 hours. Urinalysis    Component Value Date/Time   COLORURINE STRAW (A) 10/09/2023 1323   APPEARANCEUR CLEAR 10/09/2023 1323   LABSPEC 1.013 10/09/2023 1323   PHURINE 7.0 10/09/2023 1323   GLUCOSEU NEGATIVE 10/09/2023 1323   HGBUR NEGATIVE 10/09/2023 1323   BILIRUBINUR NEGATIVE 10/09/2023 1323   KETONESUR NEGATIVE 10/09/2023 1323   PROTEINUR 30 (A) 10/09/2023 1323   NITRITE NEGATIVE 10/09/2023 1323   LEUKOCYTESUR MODERATE (A) 10/09/2023 1323   Sepsis Labs Recent Labs  Lab 10/12/23 0659 10/13/23 0604 10/13/23 1144 10/14/23 0802  WBC 7.9 7.2 9.4 6.5   Microbiology Recent Results (from the past 240 hours)  Urine Culture     Status: Abnormal   Collection Time: 10/09/23  1:23 PM   Specimen: Urine, Random  Result Value Ref Range Status   Specimen Description   Final    URINE, RANDOM Performed at [facility], [address], [place], [place] [address]    Special Requests   Final    NONE Reflexed from [id number] Performed at [facility], [address], [place], [place] [address]    Culture 60,000 COLONIES/mL ENTEROCOCCUS FAECALIS (A)  Final   Report Status 10/12/2023 FINAL  Final   Organism ID, Bacteria ENTEROCOCCUS FAECALIS (A)  Final      Susceptibility   Enterococcus faecalis - MIC*    AMPICILLIN <=2 SENSITIVE Sensitive     NITROFURANTOIN <=16 SENSITIVE Sensitive     VANCOMYCIN  1 SENSITIVE Sensitive     * 60,000 COLONIES/mL ENTEROCOCCUS FAECALIS  MRSA Next Gen by PCR, Nasal  Status: None   Collection Time: 10/10/23 10:46 AM   Specimen: Nasal Mucosa; Nasal Swab  Result Value Ref Range Status    MRSA by PCR Next Gen NOT DETECTED NOT DETECTED Final    Comment: (NOTE) The GeneXpert MRSA Assay (FDA approved for NASAL specimens only), is one component of a comprehensive MRSA colonization surveillance program. It is not intended to diagnose MRSA infection nor to guide or monitor treatment for MRSA infections. Test performance is not FDA approved in patients less than 17 years old. Performed at [facility], [address], [place], [place] [address]      Time coordinating discharge: Over 35 minutes  SIGNED:   Erle Odell Castor, MD  [facility] 10/15/2023, 10:20 AM Pager   If 7PM-7AM, please contact night-coverage [email or web address] Password [email or web address]

## 2023-10-15 NOTE — Care Management Important Message (Signed)
 Important Message  Patient Details  Name: Manuel Ortega MRN: [id number] Date of Birth: May 07, 1951   Important Message Given:  Yes - Medicare IM     Dorena Bodo 10/15/2023, 3:20 PM

## 2023-10-15 NOTE — Transfer of Care (Signed)
 Immediate Anesthesia Transfer of Care Note  Patient: Manuel Ortega  Procedure(s) Performed: ESOPHAGOGASTRODUODENOSCOPY (EGD) WITH PROPOFOL  BIOPSY POLYPECTOMY  Patient Location: PACU and Endoscopy Unit  Anesthesia Type:MAC  Level of Consciousness: drowsy, patient cooperative, and responds to stimulation  Airway & Oxygen Therapy: Patient Spontanous Breathing and Patient connected to nasal cannula oxygen  Post-op Assessment: Report given to RN and Post -op Vital signs reviewed and stable  Post vital signs: Reviewed and stable  Last Vitals:  Vitals Value Taken Time  BP 87/63 10/15/23 0757  Temp 36.4 C 10/15/23 0756  Pulse 78 10/15/23 0758  Resp 31 10/15/23 0758  SpO2 98 % 10/15/23 0758  Vitals shown include unfiled device data.  Last Pain:  Vitals:   10/15/23 0756  TempSrc: Temporal  PainSc: 0-No pain         Complications: No notable events documented.

## 2023-10-15 NOTE — Anesthesia Postprocedure Evaluation (Signed)
 Anesthesia Post Note  Patient: [name]  Procedure(s) Performed: ESOPHAGOGASTRODUODENOSCOPY (EGD) WITH PROPOFOL  BIOPSY POLYPECTOMY     Patient location during evaluation: PACU Anesthesia Type: MAC Level of consciousness: awake and alert Pain management: pain level controlled Vital Signs Assessment: post-procedure vital signs reviewed and stable Respiratory status: spontaneous breathing, nonlabored ventilation and respiratory function stable Cardiovascular status: blood pressure returned to baseline and stable Postop Assessment: no apparent nausea or vomiting Anesthetic complications: no   No notable events documented.  Last Vitals:  Vitals:   10/15/23 0600 10/15/23 0756  BP: 125/75 (!) 87/63  Pulse: 76 73  Resp: 20 16  Temp: 36.5 C (!) 36.4 C  SpO2: 99% 94%    Last Pain:  Vitals:   10/15/23 0800  TempSrc:   PainSc: 0-No pain                 Almarie CHRISTELLA Marchi

## 2023-10-15 NOTE — Progress Notes (Signed)
Patient ID: Manuel Ortega, male   DOB: May 19, 1951, 73 y.o.   MRN: [id number] S: Feeling well, no complaints.  O:BP 139/85 (BP Location: Right Wrist)   Pulse 74   Temp (!) 97.5 F (36.4 C) (Oral)   Resp 20   Ht 6' 1 (1.854 m)   Wt 86.4 kg Comment: Taken via standing.  SpO2 100%   BMI 25.13 kg/m   Intake/Output Summary (Last 24 hours) at 10/15/2023 1020 Last data filed at 10/15/2023 0747 Gross per 24 hour  Intake 814 ml  Output 650 ml  Net 164 ml   Intake/Output: I/O last 3 completed shifts: In: 1061 [P.O.:951; IV Piggyback:110] Out: 1100 [Urine:1100]  Intake/Output this shift:  Total I/O In: 100 [I.V.:100] Out: -  Weight change:  Gen: NAD CVS: RRR Resp: CTA Abd: +BS, soft, NT/ND Ext: no edema  Recent Labs  Lab 10/09/23 1323 10/10/23 0529 10/11/23 1024 10/12/23 0659 10/13/23 0604 10/14/23 0802 10/15/23 0704  NA 138 131* 133* 136 135 137 136  K 4.4 3.6 3.4* 3.5 3.6 3.5 3.9  CL 101 101 96* 102 102 102 102  CO2 22 16* 25 23 22 26   --   GLUCOSE 97 127* 111* 97 88 95 89  BUN 99* 119* 54* 61* 65* 26* 30*  CREATININE 7.64* 7.90* 4.75* 6.14* 7.40* 4.85* 7.10*  ALBUMIN  3.4*  --  3.6 3.3* 3.0* 3.0*  --   CALCIUM  10.0 8.8* 9.0 8.7* 8.9 8.5*  --   PHOS  --   --  2.1* 4.3 5.6* 4.2  --   AST 15  --   --   --   --   --   --   ALT 14  --   --   --   --   --   --    Liver Function Tests: Recent Labs  Lab 10/09/23 1323 10/11/23 1024 10/12/23 0659 10/13/23 0604 10/14/23 0802  AST 15  --   --   --   --   ALT 14  --   --   --   --   ALKPHOS 62  --   --   --   --   BILITOT 0.4  --   --   --   --   PROT 6.4*  --   --   --   --   ALBUMIN  3.4*   < > 3.3* 3.0* 3.0*   < > = values in this interval not displayed.   No results for input(s): LIPASE, AMYLASE in the last 168 hours. No results for input(s): AMMONIA in the last 168 hours. CBC: Recent Labs  Lab 10/09/23 1323 10/10/23 0529 10/11/23 1024 10/12/23 0659 10/13/23 0604 10/13/23 1144 10/14/23 0802  10/15/23 0704  WBC 10.2   < > 12.1* 7.9 7.2 9.4 6.5  --   NEUTROABS 7.7  --   --   --   --   --   --   --   HGB 8.2*   < > 7.0* 6.3* 7.0* 7.7* 9.2* 10.2*  HCT 24.6*   < > 20.4* 19.6* 20.7* 22.8* 26.9* 30.0*  MCV 91.8   < > 87.2 90.3 88.8 89.1 87.9  --   PLT 277   < > 187 175 174 199 184  --    < > = values in this interval not displayed.   Cardiac Enzymes: No results for input(s): CKTOTAL, CKMB, CKMBINDEX, TROPONINI in the last 168 hours. CBG: Recent Labs  Lab 10/09/23 1206  GLUCAP 138*    Iron  Studies: No results for input(s): IRON , TIBC, TRANSFERRIN, FERRITIN in the last 72 hours. Studies/Results: No results found.  sodium chloride    Intravenous Once   sodium chloride    Intravenous Once   sodium chloride    Intravenous Once   atorvastatin   10 mg Oral Daily   calcitRIOL   1.25 mcg Oral Q M,W,F   calcium  carbonate  1 tablet Oral BID   Chlorhexidine  Gluconate Cloth  6 each Topical Q0600   cinacalcet   30 mg Oral Q M,W,F   latanoprost   1 drop Right Eye QHS   lipase/protease/amylase  72,000 Units Oral TID with meals   pantoprazole   40 mg Oral BID   QUEtiapine   200 mg Oral QHS   sertraline   200 mg Oral Daily   timolol   1 drop Right Eye BID    BMET    Component Value Date/Time   NA 136 10/15/2023 0704   K 3.9 10/15/2023 0704   CL 102 10/15/2023 0704   CO2 26 10/14/2023 0802   GLUCOSE 89 10/15/2023 0704   BUN 30 (H) 10/15/2023 0704   CREATININE 7.10 (H) 10/15/2023 0704   CALCIUM  8.5 (L) 10/14/2023 0802   GFRNONAA 12 (L) 10/14/2023 0802   GFRAA 7 (L) 06/12/2020 0644   CBC    Component Value Date/Time   WBC 6.5 10/14/2023 0802   RBC 3.06 (L) 10/14/2023 0802   HGB 10.2 (L) 10/15/2023 0704   HCT 30.0 (L) 10/15/2023 0704   PLT 184 10/14/2023 0802   MCV 87.9 10/14/2023 0802   MCH 30.1 10/14/2023 0802   MCHC 34.2 10/14/2023 0802   RDW 17.2 (H) 10/14/2023 0802   LYMPHSABS 1.3 10/09/2023 1323   MONOABS 0.9 10/09/2023 1323   EOSABS 0.2 10/09/2023 1323    BASOSABS 0.0 10/09/2023 1323    NW-  TTS 4 hours-   400 BFR TDC 2K, heparin  yes-  hgb was 10.2 on 12/17  mircera 30 q 2 weeks, venofer  50 q week, calcitriol  1.25 tts, sensipar  30 tts EDW 90.3   Assessment/ Plan: Pt is a 73 y.o. yo male with ESRD who was admitted on 10/09/2023 with SOB, DOE, pre syncope and missed HD   Assessment/Plan: 1. SOB/DOE/presyncope-  in retrospect think was more due to low hgb than volume overload-  better with transfusion.  Chest CT no PE- a few small things that will need to be followed up on -  lung nodules 2. ESRD -  normally TTS-  has not been compliant-  Was ordered to have HD yesterday with blood but was missed on schedule.  To have HD today after EGD and continue with his outpatient schedule of TTS.  3. Anemia- not sure if low hgb only due to missed HD and missed ESA -  hgb was 10.2 on 12/17-  no symptoms of bleeding -  is on PPI.  For more transfusions today with HD.  Hgb 6.3 improved to 7 and to 9.2 today after transfusion yesterday with HD.  GI Following and had EGD this morning which showed duodenal ulcer that was the likely source of blood loss.  On protonix  bid and f/u with GI.  4. Secondary hyperparathyroidism-  continue with calcitriol , sensipar - not worry about binder right now  5. HTN/volume- low BP, no meds- below edw.  Still urinates.  Had 400 mL UF with HD.   6. Disposition - stable for discharge from renal standpoint after HD today.   Fairy RONAL Sellar, MD [facility] (  [phone]  

## 2023-10-15 NOTE — Progress Notes (Signed)
 Received patient in bed to unit.  Alert and oriented.  Informed consent signed and in chart.   TX duration:3.5  Patient tolerated well.  Transported back to the room  Alert, without acute distress.  Hand-off given to patient's nurse.   Access used: right IJ Access issues: none  Total UF removed: Medication(s) given: venofer    10/15/23 1827  Vitals  Temp 98.8 F (37.1 C)  Temp Source Oral  BP 121/68  MAP (mmHg) 85  BP Location Right Arm  BP Method Automatic  Patient Position (if appropriate) Lying  Pulse Rate 82  Pulse Rate Source Monitor  ECG Heart Rate 82  Resp 12  Oxygen Therapy  SpO2 100 %  O2 Device Room Air  During Treatment Monitoring  HD Safety Checks Performed Yes  Intra-Hemodialysis Comments Tx completed;Tolerated well  Dialysis Fluid Bolus Normal Saline  Bolus Amount (mL) 300 mL      Manuel Ortega S [facility]

## 2023-10-15 NOTE — Op Note (Signed)
 [facility] Patient Name: Manuel Ortega Procedure Date : 10/15/2023 MRN: [id number] Attending MD: Elspeth SQUIBB. Leigh , MD, [id number] Date of Birth: 1951/04/20 CSN: [id number] Age: 73 Admit Type: Inpatient Procedure:                Upper GI endoscopy Indications:              Melena - anemia Providers:                Elspeth P. Leigh, MD, Burnard Fire RN, RN,                            Particia Fischer, RN, Felice Sar, Technician, Fairy Marina, Technician Referring MD:              Medicines:                Monitored Anesthesia Care Complications:            No immediate complications. Estimated blood loss:                            Minimal. Estimated Blood Loss:     Estimated blood loss was minimal. Procedure:                Pre-Anesthesia Assessment:                           - Prior to the procedure, a History and Physical                            was performed, and patient medications and                            allergies were reviewed. The patient's tolerance of                            previous anesthesia was also reviewed. The risks                            and benefits of the procedure and the sedation                            options and risks were discussed with the patient.                            All questions were answered, and informed consent                            was obtained. Prior Anticoagulants: The patient has                            taken no anticoagulant or antiplatelet agents. ASA  Grade Assessment: III - A patient with severe                            systemic disease. After reviewing the risks and                            benefits, the patient was deemed in satisfactory                            condition to undergo the procedure.                           After obtaining informed consent, the endoscope was                            passed under direct vision.  Throughout the                            procedure, the patient's blood pressure, pulse, and                            oxygen saturations were monitored continuously. The                            [id number]-H190 ([id number]) Olympus endoscope was introduced                            through the mouth, and advanced to the second part                            of duodenum. The upper GI endoscopy was                            accomplished without difficulty. The patient                            tolerated the procedure well. Scope In: Scope Out: Findings:      Esophagogastric landmarks were identified: the Z-line was found at 39       cm, the gastroesophageal junction was found at 39 cm and the upper       extent of the gastric folds was found at 42 cm from the incisors.      A 3 cm hiatal hernia was present.      The exam of the esophagus was otherwise normal.      A single 5 to 6 mm sessile polyp was found at the incisura, possibly       adenomatous. Not removed today given recent bleeding symptoms. Biopsies       were taken with a cold forceps for histology.      Patchy nodular mucosa was found in the gastric antrum. Biopsies were       taken with a cold forceps for histology.      The exam of the stomach was otherwise normal.      Biopsies were taken with a cold forceps for Helicobacter pylori testing.  One non-bleeding superficial clean based duodenal ulcer with no stigmata       of bleeding was found in the duodenal bulb. The lesion was 4 mm in       largest dimension.      Diffuse inflammation characterized by congestion (edema) and erythema       was found in the duodenal bulb and in the second portion of the       duodenum. Biopsies were taken with a cold forceps for histology.      The exam of the duodenum was otherwise normal. Impression:               - Esophagogastric landmarks identified.                           - 3 cm hiatal hernia.                           - Normal  esophagus otherwise.                           - A single gastric polyp. Biopsied.                           - Nodular mucosa in the gastric antrum. Biopsied.                           - Normal stomach otherwise - biopsies taken to rule                            out H pylori                           - Non-bleeding duodenal ulcer with no stigmata of                            bleeding in the bulb which is the likely cause of                            bleeding / anemia.                           - Duodenitis. Biopsied.                           No active bleeding, he has stopped with PPI alone.                            Hgb has been stable. Okay for discharge home today                            if otherwise stable given no high risk lesions on                            endoscopy. Recommendation:           - Return patient to hospital ward for ongoing care.                           -  Advance diet as tolerated.                           - Continue present medications.                           - Continue protonix  40mg  twice daily for 6 weeks,                            then once daily thereafter                           - Can add carafate 1 tablet every 8 hours PRN for                            breakthrough abdominal discomfort (he has had some                            epigastric discomfort recently)                           - Avoid NSAIDs                           - Await pathology results.                           - Consideration for repeat EGD in next few months                            pending pathology results                           - Outpatient MRCP to evaluate pancreatic cystic                            lesions once recovered from hospitalization, this                            is nonurgent                           - He can follow up with his outpatient GI physician                            (Dr. Niki - [facility]) - upon discharge                            - We will sign off for now, please call with                            questions in the interim Procedure Code(s):        --- Professional ---                           [phone], Esophagogastroduodenoscopy, flexible,  transoral; with biopsy, single or multiple Diagnosis Code(s):        --- Professional ---                           K44.9, Diaphragmatic hernia without obstruction or                            gangrene                           K31.7, Polyp of stomach and duodenum                           K31.89, Other diseases of stomach and duodenum                           K26.9, Duodenal ulcer, unspecified as acute or                            chronic, without hemorrhage or perforation                           K29.80, Duodenitis without bleeding                           K92.1, Melena (includes Hematochezia) CPT copyright 2022 American [employer] Association. All rights reserved. The codes documented in this report are preliminary and upon coder review may  be revised to meet current compliance requirements. Elspeth P. Manuel Jasmin, MD 10/15/2023 7:59:26 AM This report has been signed electronically. Number of Addenda: 0

## 2023-10-15 NOTE — Interval H&P Note (Signed)
 History and Physical Interval Note: Here in endoscopy for EGD this AM. He denies any complaints. Vitals table. Has not had any bleeding symptoms overnight. I have discussed risks / benefits of endoscopy and anesthesia with him and he wishes to proceed. Further recommendations pending the results.   10/15/2023 7:11 AM  Manuel Ortega  has presented today for surgery, with the diagnosis of anemia, black stool.  The various methods of treatment have been discussed with the patient and family. After consideration of risks, benefits and other options for treatment, the patient has consented to  Procedure(s): ESOPHAGOGASTRODUODENOSCOPY (EGD) WITH PROPOFOL  (N/A) as a surgical intervention.  The patient's history has been reviewed, patient examined, no change in status, stable for surgery.  I have reviewed the patient's chart and labs.  Questions were answered to the patient's satisfaction.     Elspeth P Lizet Kelso

## 2023-10-16 ENCOUNTER — Encounter (HOSPITAL_COMMUNITY): Payer: Self-pay | Admitting: Gastroenterology

## 2023-10-16 ENCOUNTER — Telehealth: Payer: Self-pay | Admitting: Nephrology

## 2023-10-16 DIAGNOSIS — R55 Syncope and collapse: Secondary | ICD-10-CM

## 2023-10-16 NOTE — Discharge Planning (Signed)
 [facility] Patient Discharge Orders- [facility] CLINIC: [place]  Patient's name: Abrahan Fulmore Admit/DC Dates: 10/09/2023 - 10/16/2023  Discharge Diagnoses: Presyncope 2/2 ABLA. Transfused 2 units prbcs GIB s/p EGD with PUD/duodenitis   Recent Labs  Lab 10/15/23 2025  HGB 10.6*  K 3.5  CALCIUM  8.8*  PHOS 2.0*  ALBUMIN  3.5    Aranesp : Given: --   Date of last dose/amount: --   PRBC's Given: Yes Date/# of units: 10/10/23  3 units  -Mircera : 50 mcg IV q 2wks    Outpatient Dialysis Orders:  -Heparin : Hold heparin  for now -EDW lowered 88 kg  (Well below OP dry weight here)  -[place]: No change   Access intervention/Change:  None   IV Antibiotics: None  Anticoagulation: None    OTHER/APPTS/ORDERS:    Completed by: Maisie Ronnald Acosta PA-C   D/C Meds to be reconciled by nurse after every discharge.    Reviewed by: MD:______ RN_______

## 2023-10-16 NOTE — Progress Notes (Signed)
 Patient has d/c order from day prior.  No reason for d/c being held.   Marlin Canary DO

## 2023-10-16 NOTE — Plan of Care (Addendum)
 RN came onto shift and received report from nightshift, per nightshift pt upset about not being discharged yesterday when RN asked if discharge orders were in place, RN was told they were not. RN given voucher ticket for pt RN received call from Dr.Vann MD and was made aware D/C orders for pt were in place, RN spoke with charge RN about situation, RN working on discharging pt. Charge RN assisted with discharge pt has voucher to go to [facility] to pick up his vehicle.

## 2023-10-16 NOTE — Plan of Care (Signed)

## 2023-10-16 NOTE — Telephone Encounter (Signed)
 Transition of Care - Initial Contact from Inpatient Facility  Date of discharge: 10/15/23 Date of contact: 10/16/23 Method: Phone Spoke to: Patient  Patient contacted to discuss transition of care from recent inpatient hospitalization. Patient was admitted to [facility] from 10/09/23- 10/15/23 with discharge diagnosis of presyncope/ABLA  The discharge medication list was reviewed.   Patient will return to his/her outpatient HD unit on: Saturday 10/17/23  No other concerns at this time.

## 2023-10-16 NOTE — Care Management Important Message (Signed)
 Important Message  Patient Details  Name: Manuel Ortega MRN: [id number] Date of Birth: 08/05/1951   Important Message Given:  Yes - Medicare IM CORRECTON  Patient left prior to IM delivery will mail letter to the patient home address.     Colbert Curenton 10/16/2023, 12:10 PM

## 2023-10-19 ENCOUNTER — Encounter: Payer: Self-pay | Admitting: Gastroenterology

## 2023-10-19 LAB — SURGICAL PATHOLOGY

## 2023-10-22 ENCOUNTER — Ambulatory Visit: Payer: Medicare Other

## 2023-11-16 DIAGNOSIS — Z8546 Personal history of malignant neoplasm of prostate: Secondary | ICD-10-CM | POA: Insufficient documentation

## 2024-01-05 ENCOUNTER — Ambulatory Visit: Admitting: Podiatry

## 2024-01-06 ENCOUNTER — Encounter: Payer: Medicare Other | Admitting: Family

## 2024-01-06 NOTE — Progress Notes (Signed)
 Erroneous encounter-disregard

## 2024-01-16 ENCOUNTER — Emergency Department (HOSPITAL_COMMUNITY)

## 2024-01-16 ENCOUNTER — Inpatient Hospital Stay (HOSPITAL_COMMUNITY)

## 2024-01-16 ENCOUNTER — Inpatient Hospital Stay (HOSPITAL_COMMUNITY)
Admission: EM | Admit: 2024-01-16 | Discharge: 2024-01-18 | DRG: 690 | Disposition: A | Discharge status: Home / Self Care | Attending: Family Medicine | Admitting: Family Medicine

## 2024-01-16 ENCOUNTER — Other Ambulatory Visit: Payer: Self-pay

## 2024-01-16 ENCOUNTER — Encounter (HOSPITAL_COMMUNITY): Payer: Self-pay

## 2024-01-16 DIAGNOSIS — K279 Peptic ulcer, site unspecified, unspecified as acute or chronic, without hemorrhage or perforation: Secondary | ICD-10-CM

## 2024-01-16 DIAGNOSIS — E876 Hypokalemia: Secondary | ICD-10-CM | POA: Diagnosis present

## 2024-01-16 DIAGNOSIS — D631 Anemia in chronic kidney disease: Secondary | ICD-10-CM | POA: Diagnosis present

## 2024-01-16 DIAGNOSIS — K8689 Other specified diseases of pancreas: Secondary | ICD-10-CM | POA: Diagnosis present

## 2024-01-16 DIAGNOSIS — Z79899 Other long term (current) drug therapy: Secondary | ICD-10-CM | POA: Diagnosis not present

## 2024-01-16 DIAGNOSIS — A419 Sepsis, unspecified organism: Principal | ICD-10-CM | POA: Diagnosis present

## 2024-01-16 DIAGNOSIS — R6521 Severe sepsis with septic shock: Secondary | ICD-10-CM | POA: Diagnosis not present

## 2024-01-16 DIAGNOSIS — Z8546 Personal history of malignant neoplasm of prostate: Secondary | ICD-10-CM | POA: Diagnosis not present

## 2024-01-16 DIAGNOSIS — N186 End stage renal disease: Secondary | ICD-10-CM | POA: Diagnosis present

## 2024-01-16 DIAGNOSIS — K862 Cyst of pancreas: Secondary | ICD-10-CM | POA: Diagnosis present

## 2024-01-16 DIAGNOSIS — R918 Other nonspecific abnormal finding of lung field: Secondary | ICD-10-CM | POA: Diagnosis present

## 2024-01-16 DIAGNOSIS — Z992 Dependence on renal dialysis: Secondary | ICD-10-CM

## 2024-01-16 DIAGNOSIS — F39 Unspecified mood [affective] disorder: Secondary | ICD-10-CM | POA: Diagnosis present

## 2024-01-16 DIAGNOSIS — Z9079 Acquired absence of other genital organ(s): Secondary | ICD-10-CM | POA: Diagnosis not present

## 2024-01-16 DIAGNOSIS — N12 Tubulo-interstitial nephritis, not specified as acute or chronic: Secondary | ICD-10-CM | POA: Diagnosis present

## 2024-01-16 DIAGNOSIS — N400 Enlarged prostate without lower urinary tract symptoms: Secondary | ICD-10-CM | POA: Diagnosis present

## 2024-01-16 HISTORY — DX: Disorder of kidney and ureter, unspecified: N28.9

## 2024-01-16 LAB — I-STAT CG4 LACTIC ACID, ED
Lactic Acid, Venous: 5.2 mmol/L (ref 0.5–1.9)
Lactic Acid, Venous: 4.4 mmol/L (ref 0.5–1.9)

## 2024-01-16 LAB — COMPREHENSIVE METABOLIC PANEL WITH GFR
ALT: 10 U/L (ref 0–44)
AST: 19 U/L (ref 15–41)
Albumin: 4.1 g/dL (ref 3.5–5.0)
Alkaline Phosphatase: 58 U/L (ref 38–126)
Anion gap: 18 — ABNORMAL HIGH (ref 5–15)
BUN: 13 mg/dL (ref 8–23)
CO2: 23 mmol/L (ref 22–32)
Calcium: 9.3 mg/dL (ref 8.9–10.3)
Chloride: 96 mmol/L — ABNORMAL LOW (ref 98–111)
Creatinine, Ser: 4.59 mg/dL — ABNORMAL HIGH (ref 0.61–1.24)
GFR, Estimated: 13 mL/min — ABNORMAL LOW (ref >60)
Glucose, Bld: 120 mg/dL — ABNORMAL HIGH (ref 70–99)
Potassium: 2.7 mmol/L — CL (ref 3.5–5.1)
Sodium: 137 mmol/L (ref 135–145)
Total Bilirubin: 0.8 mg/dL (ref 0.0–1.2)
Total Protein: 7 g/dL (ref 6.5–8.1)

## 2024-01-16 LAB — URINALYSIS, ROUTINE W REFLEX MICROSCOPIC
Bilirubin Urine: NEGATIVE
Glucose, UA: NEGATIVE mg/dL
Hgb urine dipstick: NEGATIVE
Ketones, ur: NEGATIVE mg/dL
Nitrite: NEGATIVE
Protein, ur: 100 mg/dL — AB
Specific Gravity, Urine: 1.006 (ref 1.005–1.030)
WBC, UA: >50 WBC/hpf (ref 0–5)
pH: 9 — ABNORMAL HIGH (ref 5.0–8.0)

## 2024-01-16 LAB — CBC WITH DIFFERENTIAL/PLATELET
Abs Immature Granulocytes: 0.23 10*3/uL — ABNORMAL HIGH (ref 0.00–0.07)
Basophils Absolute: 0 10*3/uL (ref 0.0–0.1)
Basophils Relative: 0 %
Eosinophils Absolute: 0 10*3/uL (ref 0.0–0.5)
Eosinophils Relative: 0 %
HCT: 37.4 % — ABNORMAL LOW (ref 39.0–52.0)
Hemoglobin: 12.2 g/dL — ABNORMAL LOW (ref 13.0–17.0)
Immature Granulocytes: 1 %
Lymphocytes Relative: 6 %
Lymphs Abs: 1.2 10*3/uL (ref 0.7–4.0)
MCH: 29.3 pg (ref 26.0–34.0)
MCHC: 32.6 g/dL (ref 30.0–36.0)
MCV: 89.9 fL (ref 80.0–100.0)
Monocytes Absolute: 1.2 10*3/uL — ABNORMAL HIGH (ref 0.1–1.0)
Monocytes Relative: 6 %
Neutro Abs: 16 10*3/uL — ABNORMAL HIGH (ref 1.7–7.7)
Neutrophils Relative %: 87 %
Platelets: 241 10*3/uL (ref 150–400)
RBC: 4.16 MIL/uL — ABNORMAL LOW (ref 4.22–5.81)
RDW: 14 % (ref 11.5–15.5)
WBC: 18.6 10*3/uL — ABNORMAL HIGH (ref 4.0–10.5)
nRBC: 0.1 % (ref 0.0–0.2)

## 2024-01-16 LAB — RESP PANEL BY RT-PCR (RSV, FLU A&B, COVID)  RVPGX2
Influenza A by PCR: NEGATIVE
Influenza B by PCR: NEGATIVE
Resp Syncytial Virus by PCR: NEGATIVE
SARS Coronavirus 2 by RT PCR: NEGATIVE

## 2024-01-16 LAB — PROTIME-INR
INR: 1 (ref 0.8–1.2)
Prothrombin Time: 13.7 s (ref 11.4–15.2)

## 2024-01-16 MED ORDER — LACTATED RINGERS IV BOLUS (SEPSIS)
1000.0000 mL | Freq: Once | INTRAVENOUS | Status: AC
Start: 2024-01-16 — End: 2024-01-16
  Administered 2024-01-16: 1000 mL via INTRAVENOUS

## 2024-01-16 MED ORDER — ONDANSETRON HCL 4 MG PO TABS: 4.0000 mg | ORAL_TABLET | Freq: Four times a day (QID) | ORAL | Status: DC | PRN

## 2024-01-16 MED ORDER — HEPARIN SODIUM (PORCINE) 5000 UNIT/ML IJ SOLN
5000.0000 [IU] | Freq: Three times a day (TID) | INTRAMUSCULAR | Status: DC
  Administered 2024-01-16: 5000 [IU] via SUBCUTANEOUS
  Filled 2024-01-16: qty 1

## 2024-01-16 MED ORDER — ONDANSETRON HCL 4 MG/2ML IJ SOLN: 4.0000 mg | Freq: Four times a day (QID) | INTRAMUSCULAR | Status: DC | PRN

## 2024-01-16 MED ORDER — MELATONIN 3 MG PO TABS: 3.0000 mg | ORAL_TABLET | Freq: Every evening | ORAL | Status: DC | PRN

## 2024-01-16 MED ORDER — PANCRELIPASE (LIP-PROT-AMYL) 36000-114000 UNITS PO CPEP
36000.0000 [IU] | ORAL_CAPSULE | Freq: Once | ORAL | Status: AC
  Administered 2024-01-16: 36000 [IU] via ORAL
  Filled 2024-01-16: qty 1

## 2024-01-16 MED ORDER — VANCOMYCIN HCL 2000 MG/400ML IV SOLN
2000.0000 mg | Freq: Once | INTRAVENOUS | Status: AC
  Administered 2024-01-16: 2000 mg via INTRAVENOUS
  Filled 2024-01-16: qty 400

## 2024-01-16 MED ORDER — LACTATED RINGERS IV BOLUS (SEPSIS)
1000.0000 mL | Freq: Once | INTRAVENOUS | Status: AC
  Administered 2024-01-16: 1000 mL via INTRAVENOUS

## 2024-01-16 MED ORDER — VANCOMYCIN HCL IN DEXTROSE 1-5 GM/200ML-% IV SOLN
1000.0000 mg | Freq: Once | INTRAVENOUS | Status: DC
  Filled 2024-01-16: qty 200

## 2024-01-16 MED ORDER — BISACODYL 5 MG PO TBEC: 5.0000 mg | DELAYED_RELEASE_TABLET | Freq: Every day | ORAL | Status: DC | PRN

## 2024-01-16 MED ORDER — ACETAMINOPHEN 500 MG PO TABS
1000.0000 mg | ORAL_TABLET | Freq: Once | ORAL | Status: AC
  Administered 2024-01-16: 1000 mg via ORAL
  Filled 2024-01-16: qty 2

## 2024-01-16 MED ORDER — ACETAMINOPHEN 500 MG PO TABS
500.0000 mg | ORAL_TABLET | Freq: Four times a day (QID) | ORAL | Status: DC | PRN
  Administered 2024-01-16: 500 mg via ORAL
  Filled 2024-01-16: qty 1

## 2024-01-16 MED ORDER — POTASSIUM CHLORIDE 10 MEQ/100ML IV SOLN
10.0000 meq | Freq: Once | INTRAVENOUS | Status: AC
  Administered 2024-01-16: 10 meq via INTRAVENOUS
  Filled 2024-01-16: qty 100

## 2024-01-16 MED ORDER — SODIUM CHLORIDE 0.9 % IV SOLN
2.0000 g | Freq: Once | INTRAVENOUS | Status: AC
  Administered 2024-01-16: 2 g via INTRAVENOUS
  Filled 2024-01-16: qty 12.5

## 2024-01-16 MED ORDER — METRONIDAZOLE 500 MG/100ML IV SOLN: 500.0000 mg | Freq: Once | INTRAVENOUS | Status: DC

## 2024-01-16 MED ORDER — LACTATED RINGERS IV BOLUS (SEPSIS)
500.0000 mL | Freq: Once | INTRAVENOUS | Status: AC
  Administered 2024-01-16: 500 mL via INTRAVENOUS

## 2024-01-16 MED ORDER — IOHEXOL 350 MG/ML SOLN
75.0000 mL | Freq: Once | INTRAVENOUS | Status: AC | PRN
  Administered 2024-01-16: 75 mL via INTRAVENOUS

## 2024-01-16 MED ORDER — METRONIDAZOLE 500 MG/100ML IV SOLN
500.0000 mg | Freq: Once | INTRAVENOUS | Status: AC
  Administered 2024-01-16: 500 mg via INTRAVENOUS
  Filled 2024-01-16: qty 100

## 2024-01-16 MED ORDER — METRONIDAZOLE 500 MG/100ML IV SOLN
500.0000 mg | Freq: Two times a day (BID) | INTRAVENOUS | Status: DC
  Administered 2024-01-17 – 2024-01-18 (×3): 500 mg via INTRAVENOUS
  Filled 2024-01-16 (×3): qty 100

## 2024-01-16 NOTE — H&P (Signed)
 History and Physical    Patient: Manuel Ortega YNW:[id number] DOB: [birth year] DOA: 01/16/2024 DOS: the patient was seen and examined on 01/16/2024 PCP: Default, Provider, MD  Patient coming from: Home  Chief Complaint:  Chief Complaint  Patient presents with   Shortness of Breath   Fever   HPI: Manuel Ortega is a 73 y.o. male with medical history significant for end-stage renal disease on hemodialysis Tuesdays Thursdays and Saturdays, prostate cancer status post TURP, bleeding peptic ulcer in January 2025, pancreatic insufficiency, BPH, and pulmonary nodule who presents after fever and severe shaking chills at home.  The patient says he felt fine this morning and yesterday.  He did not start to feel freezing cold and could not get warm until he was on dialysis today.  He thought it was just the dialysis room so he left dialysis early and went home.  At home he still could not get warm.  His thermostat said it was 80 degrees in his house and he was still having chills.  He also had a fever at home.  He vomited once then felt very dizzy and weak.  He hears to unlock his door he was then going to call EMS but his weakness became severe and he had to crawl to unlock it.  He did have some shortness of breath during this episode but prior he was not having any cold symptoms or shortness of breath or diarrhea.  He did have steroid injections in both of his knees 3 days ago. In the emergency department the patient was evaluated and was tachycardic, tachypneic, and had an elevated white blood cell count of 18.  His initial systolic blood pressures were in the 90s.  He did receive some IV fluids and blood pressures have been stable over 100 since that time. The patient's exam is benign.  There is no clear source of his sepsis.  Further workup is pending.  He will be admitted to the hospitalist service.  He did receive IV vancomycin, Maxipime and Flagyl empirically. He will be admitted to the hospitalist service  with a diagnosis of sepsis for further workup and management.    Review of Systems: As mentioned in the history of present illness. All other systems reviewed and are negative. Past Medical History:  Diagnosis Date   Renal disorder    Dialysis T/th/sat    Social History:  has no history on file for tobacco use, alcohol use, and drug use.  No Known Allergies  No family history on file.  Prior to Admission medications   Medication Sig Start Date End Date Taking? Authorizing Provider  ferrous sulfate 325 (65 FE) MG EC tablet Take 325 mg by mouth daily with breakfast.   Yes [provider]  zolpidem (AMBIEN) 10 MG tablet Take 10 mg by mouth daily as needed for sleep.   Yes [provider]  CREON 36000-114000 units CPEP capsule Take 2 capsules by mouth 3 (three) times daily before meals. Take one capsule for snack in addition to TID 01/08/24  Yes [provider]  LUMIGAN 0.01 % SOLN Place 1 drop into both eyes at bedtime. 12/21/23  Yes [provider]  mirtazapine (REMERON) 15 MG tablet Take 15 mg by mouth daily. 11/22/23  Yes [provider]  montelukast (SINGULAIR) 10 MG tablet Take 10 mg by mouth daily. 12/18/23  Yes [provider]  pantoprazole (PROTONIX) 40 MG tablet Take 40 mg by mouth 2 (two) times daily. 11/26/23  Yes [provider]  QUEtiapine Fumarate (SEROQUEL XR) 150 MG 24 hr tablet Take 150 mg by mouth at bedtime. 11/27/23  Yes [provider]  sertraline (ZOLOFT) 100 MG tablet Take 200 mg by mouth daily. 11/27/23  Yes [provider]  sevelamer carbonate (RENVELA) 800 MG tablet Take 1,600 mg by mouth in the morning and at bedtime. 12/29/23  Yes [provider]  sildenafil (VIAGRA) 100 MG tablet Take 100 mg by mouth as needed for erectile dysfunction. 12/17/23  Yes [provider]  timolol (TIMOPTIC) 0.5 % ophthalmic solution Place 1 drop into both eyes every morning. 12/18/23  Yes [provider]    Physical Exam: Vitals:   01/16/24 1627 01/16/24 1900 01/16/24 1903 01/16/24 2000  BP: (!) 104/49 (!) 93/53  (!) 101/52  Pulse: (!) 110 99  (!) 105  Resp: (!) 25 20  13   Temp: 100 F (37.8 C)  (!) 100.9 F (38.3 C)   TempSrc: Oral  Oral   SpO2: 100% 100%  100%  Weight:      Height:       Physical Exam:  General: No acute distress, well developed, well nourished HEENT: Normocephalic, atraumatic, PERRL Cardiovascular: Normal rate and rhythm. Distal pulses intact. Pulmonary: Normal pulmonary effort, normal breath sounds Gastrointestinal: Nondistended abdomen, soft, non-tender, normoactive bowel sounds Musculoskeletal:Normal ROM, no lower ext edema Skin: Skin is warm and dry. Neuro: No focal deficits noted, AAOx3. PSYCH: Attentive and cooperative  Data Reviewed:  Results for orders placed or performed during the hospital encounter of 01/16/24 (from the past 24 hours)  Resp panel by RT-PCR (RSV, Flu A&B, Covid)     Status: None   Collection Time: 01/16/24  4:29 PM   Specimen: Nasal Swab  Result Value Ref Range   SARS Coronavirus 2 by RT PCR NEGATIVE NEGATIVE   Influenza A by PCR NEGATIVE NEGATIVE   Influenza B by PCR NEGATIVE NEGATIVE   Resp Syncytial Virus by PCR NEGATIVE NEGATIVE  Comprehensive metabolic panel     Status: Abnormal   Collection Time: 01/16/24  4:58 PM  Result Value Ref Range   Sodium 137 135 - 145 mmol/L   Potassium 2.7 (LL) 3.5 - 5.1 mmol/L   Chloride 96 (L) 98 - 111 mmol/L   CO2 23 22 - 32 mmol/L   Glucose, Bld 120 (H) 70 - 99 mg/dL   BUN 13 8 - 23 mg/dL   Creatinine, Ser 1.61 (H) 0.61 - 1.24 mg/dL   Calcium 9.3 8.9 - 09.6 mg/dL   Total Protein 7.0 6.5 - 8.1 g/dL   Albumin 4.1 3.5 - 5.0 g/dL   AST 19 15 - 41 U/L   ALT 10 0 - 44 U/L   Alkaline Phosphatase 58 38 - 126 U/L   Total Bilirubin 0.8 0.0 - 1.2 mg/dL   GFR, Estimated 13 (L) >60 mL/min   Anion gap 18 (H) 5 - 15  CBC with Differential     Status: Abnormal   Collection  Time: 01/16/24  4:58 PM  Result Value Ref Range   WBC 18.6 (H) 4.0 - 10.5 K/uL   RBC 4.16 (L) 4.22 - 5.81 MIL/uL   Hemoglobin 12.2 (L) 13.0 - 17.0 g/dL   HCT 04.5 (L) 40.9 - 81.1 %   MCV 89.9 80.0 - 100.0 fL   MCH 29.3 26.0 - 34.0 pg   MCHC 32.6 30.0 - 36.0 g/dL   RDW 91.4 78.2 - 95.6 %   Platelets 241 150 - 400 K/uL  nRBC 0.1 0.0 - 0.2 %   Neutrophils Relative % 87 %   Neutro Abs 16.0 (H) 1.7 - 7.7 K/uL   Lymphocytes Relative 6 %   Lymphs Abs 1.2 0.7 - 4.0 K/uL   Monocytes Relative 6 %   Monocytes Absolute 1.2 (H) 0.1 - 1.0 K/uL   Eosinophils Relative 0 %   Eosinophils Absolute 0.0 0.0 - 0.5 K/uL   Basophils Relative 0 %   Basophils Absolute 0.0 0.0 - 0.1 K/uL   Immature Granulocytes 1 %   Abs Immature Granulocytes 0.23 (H) 0.00 - 0.07 K/uL  Protime-INR     Status: None   Collection Time: 01/16/24  4:58 PM  Result Value Ref Range   Prothrombin Time 13.7 11.4 - 15.2 seconds   INR 1.0 0.8 - 1.2  I-Stat Lactic Acid, ED     Status: Abnormal   Collection Time: 01/16/24  5:22 PM  Result Value Ref Range   Lactic Acid, Venous 5.2 (HH) 0.5 - 1.9 mmol/L   Comment NOTIFIED PHYSICIAN   Urinalysis, Routine w reflex microscopic -Urine, Clean Catch     Status: Abnormal   Collection Time: 01/16/24  5:24 PM  Result Value Ref Range   Color, Urine YELLOW YELLOW   APPearance HAZY (A) CLEAR   Specific Gravity, Urine 1.006 1.005 - 1.030   pH 9.0 (H) 5.0 - 8.0   Glucose, UA NEGATIVE NEGATIVE mg/dL   Hgb urine dipstick NEGATIVE NEGATIVE   Bilirubin Urine NEGATIVE NEGATIVE   Ketones, ur NEGATIVE NEGATIVE mg/dL   Protein, ur 161 (A) NEGATIVE mg/dL   Nitrite NEGATIVE NEGATIVE   Leukocytes,Ua LARGE (A) NEGATIVE   RBC / HPF 6-10 0 - 5 RBC/hpf   WBC, UA >50 0 - 5 WBC/hpf   Bacteria, UA FEW (A) NONE SEEN   Squamous Epithelial / HPF 0-5 0 - 5 /HPF   Amorphous Crystal PRESENT      Assessment and Plan: Sepsis - source unclear. - Await cultures - Empiric Vanco, cefepime, and Flagyl - CT of  chest abdomen pelvis ordered - Bacteremia from his recent knee injections could be a possible source but he has no evidence of septic joints.  2. ESRD - - continue dialysis Tuesday, Thursday, and Saturday - Consult nephrology  3. 7mm left pulmonary nodule found 10/2023 - - follow up CT results.  4.  Pancreatic insufficiency -continue Creon  5.  GI bleed due to peptic ulcer in January 2025 -will hold his heparin for DVT prophylaxis until we make sure his hemoglobin is stable. - Continue Protonix   Advance Care Planning:   Code Status: Full Code the patient wants to be full code and names his friend Austin Blumenthal as his Runner, broadcasting/film/video.  Consults: none  Family Communication: none  Severity of Illness: The appropriate patient status for this patient is INPATIENT. Inpatient status is judged to be reasonable and necessary in order to provide the required intensity of service to ensure the patient's safety. The patient's presenting symptoms, physical exam findings, and initial radiographic and laboratory data in the context of their chronic comorbidities is felt to place them at high risk for further clinical deterioration. Furthermore, it is not anticipated that the patient will be medically stable for discharge from the hospital within 2 midnights of admission.   * I certify that at the point of admission it is my clinical judgment that the patient will require inpatient hospital care spanning beyond 2 midnights from the point of admission due to  high intensity of service, high risk for further deterioration and high frequency of surveillance required.*  Author: Willadean Hark, MD 01/16/2024 10:18 PM  For on call review [email or web address].

## 2024-01-16 NOTE — ED Provider Notes (Signed)
 [place] EMERGENCY DEPARTMENT AT [facility] Provider Note   CSN: [id number] Arrival date & time: 01/16/24  1619     History  Chief Complaint  Patient presents with   Shortness of Breath   Fever    Manuel Ortega is a 73 y.o. male.  73 yo M with chief complaints of being sick.  He said that he noticed this today.  Yesterday he reports that he was fine.  He has been coughing a bit over the past few days.  He was getting dialysis today and noticed that he was exceptionally cold and had to leave dialysis early due to the same.  He then started feeling body aches all over.  He denies any urinary symptoms.   Shortness of Breath Associated symptoms: fever   Fever      Home Medications Prior to Admission medications   Not on File      Allergies    Patient has no known allergies.    Review of Systems   Review of Systems  Constitutional:  Positive for fever.  Respiratory:  Positive for shortness of breath.     Physical Exam Updated Vital Signs BP (!) 93/53   Pulse 99   Temp (!) 100.9 F (38.3 C) (Oral)   Resp 20   Ht 6\' 1"  (1.854 m)   Wt 90.3 kg   SpO2 100%   BMI 26.25 kg/m  Physical Exam Vitals and nursing note reviewed.  Constitutional:      Appearance: He is well-developed.  HENT:     Head: Normocephalic and atraumatic.  Eyes:     Pupils: Pupils are equal, round, and reactive to light.  Neck:     Vascular: No JVD.  Cardiovascular:     Rate and Rhythm: Normal rate and regular rhythm.     Heart sounds: No murmur heard.    No friction rub. No gallop.  Pulmonary:     Effort: No respiratory distress.     Breath sounds: No wheezing.  Abdominal:     General: There is no distension.     Tenderness: There is no abdominal tenderness. There is no guarding or rebound.  Musculoskeletal:        General: Normal range of motion.     Cervical back: Normal range of motion and neck supple.  Skin:    Coloration: Skin is not pale.     Findings: No rash.   Neurological:     Mental Status: He is alert and oriented to person, place, and time.  Psychiatric:        Behavior: Behavior normal.     ED Results / Procedures / Treatments   Labs (all labs ordered are listed, but only abnormal results are displayed) Labs Reviewed  COMPREHENSIVE METABOLIC PANEL WITH GFR - Abnormal; Notable for the following components:      Result Value   Potassium 2.7 (*)    Chloride 96 (*)    Glucose, Bld 120 (*)    Creatinine, Ser 4.59 (*)    GFR, Estimated 13 (*)    Anion gap 18 (*)    All other components within normal limits  CBC WITH DIFFERENTIAL/PLATELET - Abnormal; Notable for the following components:   WBC 18.6 (*)    RBC 4.16 (*)    Hemoglobin 12.2 (*)    HCT 37.4 (*)    Neutro Abs 16.0 (*)    Monocytes Absolute 1.2 (*)    Abs Immature Granulocytes 0.23 (*)  All other components within normal limits  URINALYSIS, ROUTINE W REFLEX MICROSCOPIC - Abnormal; Notable for the following components:   APPearance HAZY (*)    pH 9.0 (*)    Protein, ur 100 (*)    Leukocytes,Ua LARGE (*)    Bacteria, UA FEW (*)    All other components within normal limits  I-STAT CG4 LACTIC ACID, ED - Abnormal; Notable for the following components:   Lactic Acid, Venous 5.2 (*)    All other components within normal limits  RESP PANEL BY RT-PCR (RSV, FLU A&B, COVID)  RVPGX2  CULTURE, BLOOD (ROUTINE X 2)  CULTURE, BLOOD (ROUTINE X 2)  PROTIME-INR  I-STAT CG4 LACTIC ACID, ED    EKG EKG Interpretation Date/Time:  Saturday January 16 2024 17:15:27 EDT Ventricular Rate:  105 PR Interval:  55 QRS Duration:  109 QT Interval:  502 QTC Calculation: 664 R Axis:   -45  Text Interpretation: Sinus tachycardia Ventricular premature complex LAD, consider left anterior fascicular block Probable anteroseptal infarct, old Prolonged QT interval No significant change since last tracing Confirmed by Albertus Hughs [phone]) on 01/16/2024 5:24:20 PM  Radiology No results  found.  Procedures .Critical Care  Performed by: Albertus Hughs, DO Authorized by: Albertus Hughs, DO   Critical care provider statement:    Critical care time (minutes):  80   Critical care time was exclusive of:  Separately billable procedures and treating other patients   Critical care was time spent personally by me on the following activities:  Development of treatment plan with patient or surrogate, discussions with consultants, evaluation of patient's response to treatment, examination of patient, ordering and review of laboratory studies, ordering and review of radiographic studies, ordering and performing treatments and interventions, pulse oximetry, re-evaluation of patient's condition and review of old charts   Care discussed with: admitting provider       Medications Ordered in ED Medications  lactated ringers bolus 500 mL (0 mLs Intravenous Stopped 01/16/24 1817)  acetaminophen (TYLENOL) tablet 1,000 mg (1,000 mg Oral Given 01/16/24 1756)  ceFEPIme (MAXIPIME) 2 g in sodium chloride 0.9 % 100 mL IVPB (0 g Intravenous Stopped 01/16/24 1847)  metroNIDAZOLE (FLAGYL) IVPB 500 mg (0 mg Intravenous Stopped 01/16/24 1859)  lactated ringers bolus 1,000 mL (0 mLs Intravenous Stopped 01/16/24 1859)  vancomycin (VANCOREADY) IVPB 2000 mg/400 mL (2,000 mg Intravenous New Bag/Given 01/16/24 1816)  potassium chloride 10 mEq in 100 mL IVPB (0 mEq Intravenous Stopped 01/16/24 2011)  lactated ringers bolus 1,000 mL (1,000 mLs Intravenous New Bag/Given 01/16/24 1901)    ED Course/ Medical Decision Making/ A&P                                 Medical Decision Making Amount and/or Complexity of Data Reviewed Labs: ordered. Radiology: ordered.  Risk OTC drugs. Prescription drug management.   73 yo M with a chief complaints of chills myalgias fever.  Just noticed today.  Feels like he has been sick for couple days coughing and congested.  He tells me his had no trouble with dialysis but on further discussion it  sounds like he does leave early every so often when he does not feel like he needs this today or if he feels like it is too uncomfortable.  Will obtain a laboratory evaluation here.  Chest x-ray.  Patient's lactate is over 5.  Will activate as a code sepsis.  Do not feel that he  would be a great candidate for 30 cc/kg of IV fluids.  Has a history of end-stage renal disease and is on dialysis.  Patient's blood pressure remains somewhat soft.  Last 45 minutes has had a MAP >65.  With possible limit on fluid administration will discuss with critical care.  Patient's potassium is 2.7.  I discussed case with.  Critical Care, Dr. Zaida Hertz recommended another bolus of IV fluids and reassess.  Patient received another liter of IV fluids.  His blood pressures remain soft but remained stable.  Will discuss with medicine.  I discussed with the hospitalist who recommends CT scan of the chest abdomen and pelvis. Will come to eval for admission.   The patients results and plan were reviewed and discussed.   Any x-rays performed were independently reviewed by myself.   Differential diagnosis were considered with the presenting HPI.  Medications  lactated ringers bolus 500 mL (0 mLs Intravenous Stopped 01/16/24 1817)  acetaminophen (TYLENOL) tablet 1,000 mg (1,000 mg Oral Given 01/16/24 1756)  ceFEPIme (MAXIPIME) 2 g in sodium chloride 0.9 % 100 mL IVPB (0 g Intravenous Stopped 01/16/24 1847)  metroNIDAZOLE (FLAGYL) IVPB 500 mg (0 mg Intravenous Stopped 01/16/24 1859)  lactated ringers bolus 1,000 mL (0 mLs Intravenous Stopped 01/16/24 1859)  vancomycin (VANCOREADY) IVPB 2000 mg/400 mL (2,000 mg Intravenous New Bag/Given 01/16/24 1816)  potassium chloride 10 mEq in 100 mL IVPB (0 mEq Intravenous Stopped 01/16/24 2011)  lactated ringers bolus 1,000 mL (1,000 mLs Intravenous New Bag/Given 01/16/24 1901)    Vitals:   01/16/24 1625 01/16/24 1627 01/16/24 1900 01/16/24 1903  BP:  (!) 104/49 (!) 93/53   Pulse:  (!) 110  99   Resp:  (!) 25 20   Temp:  100 F (37.8 C)  (!) 100.9 F (38.3 C)  TempSrc:  Oral  Oral  SpO2:  100% 100%   Weight: 90.3 kg     Height: 6\' 1"  (1.854 m)       Final diagnoses:  Sepsis, due to unspecified organism, unspecified whether acute organ dysfunction present [facility])           Final Clinical Impression(s) / ED Diagnoses Final diagnoses:  Sepsis, due to unspecified organism, unspecified whether acute organ dysfunction present [facility])    Rx / DC Orders ED Discharge Orders     None         Albertus Hughs, DO 01/16/24 2041

## 2024-01-16 NOTE — ED Notes (Signed)
 Pt pressed call bell stating he could not breathe. This RN entered room to assess patient and noted that patient's oxygen saturation was at 100% but patient was very slouched in the bed. This RN offered to help adjust patient and informed patient that it might improve his breathing to be sitting up. Pt refused and stated he was more comfortable laying flat.

## 2024-01-16 NOTE — Sepsis Progress Note (Signed)
 Elink following code sepsis

## 2024-01-16 NOTE — ED Notes (Addendum)
  BIB EMS   FROM HOME  GCS 15, AOx4  Laying on the floor, tachypnea, lightheaded dizzy, diminished L/S in all fields  101.2 T  First B/P by EMS 75/50   1 liter of LR, 20 L AC  86/45 last pressure from EMS   RR 20  HR 113  Has received 2 duo nebs   Hx: dialysis, still produces urine, did not receive full treatment today.

## 2024-01-17 ENCOUNTER — Encounter (HOSPITAL_COMMUNITY): Payer: Self-pay | Admitting: Internal Medicine

## 2024-01-17 DIAGNOSIS — K862 Cyst of pancreas: Secondary | ICD-10-CM | POA: Insufficient documentation

## 2024-01-17 DIAGNOSIS — R6521 Severe sepsis with septic shock: Secondary | ICD-10-CM

## 2024-01-17 DIAGNOSIS — A419 Sepsis, unspecified organism: Secondary | ICD-10-CM | POA: Diagnosis not present

## 2024-01-17 DIAGNOSIS — R918 Other nonspecific abnormal finding of lung field: Secondary | ICD-10-CM | POA: Insufficient documentation

## 2024-01-17 LAB — CBC
HCT: 31.2 % — ABNORMAL LOW (ref 39.0–52.0)
Hemoglobin: 10.3 g/dL — ABNORMAL LOW (ref 13.0–17.0)
MCH: 29.9 pg (ref 26.0–34.0)
MCHC: 33 g/dL (ref 30.0–36.0)
MCV: 90.4 fL (ref 80.0–100.0)
Platelets: 189 10*3/uL (ref 150–400)
RBC: 3.45 MIL/uL — ABNORMAL LOW (ref 4.22–5.81)
RDW: 14.8 % (ref 11.5–15.5)
WBC: 12 10*3/uL — ABNORMAL HIGH (ref 4.0–10.5)
nRBC: 0 % (ref 0.0–0.2)

## 2024-01-17 LAB — MRSA NEXT GEN BY PCR, NASAL: MRSA by PCR Next Gen: NOT DETECTED

## 2024-01-17 LAB — BASIC METABOLIC PANEL WITH GFR
Anion gap: 12 (ref 5–15)
BUN: 19 mg/dL (ref 8–23)
CO2: 25 mmol/L (ref 22–32)
Calcium: 8.6 mg/dL — ABNORMAL LOW (ref 8.9–10.3)
Chloride: 101 mmol/L (ref 98–111)
Creatinine, Ser: 5.33 mg/dL — ABNORMAL HIGH (ref 0.61–1.24)
GFR, Estimated: 11 mL/min — ABNORMAL LOW (ref >60)
Glucose, Bld: 105 mg/dL — ABNORMAL HIGH (ref 70–99)
Potassium: 3.1 mmol/L — ABNORMAL LOW (ref 3.5–5.1)
Sodium: 138 mmol/L (ref 135–145)

## 2024-01-17 LAB — I-STAT CG4 LACTIC ACID, ED: Lactic Acid, Venous: 2 mmol/L (ref 0.5–1.9)

## 2024-01-17 MED ORDER — PANCRELIPASE (LIP-PROT-AMYL) 36000-114000 UNITS PO CPEP
72000.0000 [IU] | ORAL_CAPSULE | Freq: Three times a day (TID) | ORAL | Status: DC
Start: 2024-01-17 — End: 2024-01-18
  Administered 2024-01-17 – 2024-01-18 (×5): 72000 [IU] via ORAL
  Filled 2024-01-17 (×5): qty 2

## 2024-01-17 MED ORDER — TIMOLOL MALEATE 0.5 % OP SOLN
1.0000 [drp] | Freq: Every morning | OPHTHALMIC | Status: DC
  Administered 2024-01-17 – 2024-01-18 (×2): 1 [drp] via OPHTHALMIC
  Filled 2024-01-17: qty 5

## 2024-01-17 MED ORDER — PANTOPRAZOLE SODIUM 40 MG PO TBEC
40.0000 mg | DELAYED_RELEASE_TABLET | Freq: Two times a day (BID) | ORAL | Status: DC
  Administered 2024-01-17 – 2024-01-18 (×3): 40 mg via ORAL
  Filled 2024-01-17 (×3): qty 1

## 2024-01-17 MED ORDER — CHLORHEXIDINE GLUCONATE CLOTH 2 % EX PADS
6.0000 | MEDICATED_PAD | Freq: Every day | CUTANEOUS | Status: DC
  Administered 2024-01-18: 6 via TOPICAL

## 2024-01-17 MED ORDER — SERTRALINE HCL 100 MG PO TABS
200.0000 mg | ORAL_TABLET | Freq: Every day | ORAL | Status: DC
  Administered 2024-01-17 – 2024-01-18 (×2): 200 mg via ORAL
  Filled 2024-01-17 (×2): qty 2

## 2024-01-17 MED ORDER — VANCOMYCIN HCL IN DEXTROSE 1-5 GM/200ML-% IV SOLN: 1000.0000 mg | INTRAVENOUS | Status: DC

## 2024-01-17 MED ORDER — CEFEPIME HCL 1 G IJ SOLR
1.0000 g | INTRAMUSCULAR | Status: DC
  Administered 2024-01-17: 1 g via INTRAVENOUS
  Filled 2024-01-17 (×2): qty 10

## 2024-01-17 MED ORDER — QUETIAPINE FUMARATE ER 50 MG PO TB24
150.0000 mg | ORAL_TABLET | Freq: Every day | ORAL | Status: DC
  Administered 2024-01-17: 150 mg via ORAL
  Filled 2024-01-17 (×2): qty 3

## 2024-01-17 MED ORDER — LATANOPROST 0.005 % OP SOLN
1.0000 [drp] | Freq: Every day | OPHTHALMIC | Status: DC
  Administered 2024-01-17: 1 [drp] via OPHTHALMIC
  Filled 2024-01-17: qty 2.5

## 2024-01-17 MED ORDER — MONTELUKAST SODIUM 10 MG PO TABS
10.0000 mg | ORAL_TABLET | Freq: Every day | ORAL | Status: DC
  Administered 2024-01-17 – 2024-01-18 (×2): 10 mg via ORAL
  Filled 2024-01-17 (×2): qty 1

## 2024-01-17 MED ORDER — SODIUM CHLORIDE 0.9% FLUSH
10.0000 mL | Freq: Two times a day (BID) | INTRAVENOUS | Status: DC
  Administered 2024-01-18: 10 mL

## 2024-01-17 MED ORDER — PANCRELIPASE (LIP-PROT-AMYL) 36000-114000 UNITS PO CPEP: 72000.0000 [IU] | ORAL_CAPSULE | Freq: Three times a day (TID) | ORAL | Status: DC

## 2024-01-17 MED ORDER — FERROUS SULFATE 325 (65 FE) MG PO TABS
325.0000 mg | ORAL_TABLET | Freq: Every day | ORAL | Status: DC
  Administered 2024-01-17 – 2024-01-18 (×2): 325 mg via ORAL
  Filled 2024-01-17 (×2): qty 1

## 2024-01-17 MED ORDER — MIRTAZAPINE 15 MG PO TABS
15.0000 mg | ORAL_TABLET | Freq: Every day | ORAL | Status: DC
  Administered 2024-01-17: 15 mg via ORAL
  Filled 2024-01-17: qty 1

## 2024-01-17 MED ORDER — SODIUM CHLORIDE 0.9% FLUSH: 10.0000 mL | INTRAVENOUS | Status: DC | PRN

## 2024-01-17 MED ORDER — SEVELAMER CARBONATE 800 MG PO TABS
800.0000 mg | ORAL_TABLET | Freq: Three times a day (TID) | ORAL | Status: DC
  Administered 2024-01-17 – 2024-01-18 (×4): 800 mg via ORAL
  Filled 2024-01-17 (×4): qty 1

## 2024-01-17 NOTE — Assessment & Plan Note (Signed)
 Incidental findings.   - Repeat CT chest in 12-18 months

## 2024-01-17 NOTE — Progress Notes (Signed)
  Progress Note   Patient: Manuel Ortega ZHY:[id number] DOB: [birth year] DOA: 01/16/2024     1 DOS: the patient was seen and examined on 01/17/2024 at 9:24AM      Brief hospital course: Manuel Ortega is a 73 y.o. M with ESRD on HD TThS, prosCA, and pancreatic insufficiency who presented with fever and chills vomiting and dizziness.  Lactic acid >5 mmol/L, WBC 18K.  CT chest abdomen and pelvis showed no acute findings.  Urine with many WBCs.    Admitted on broad spectrum antibiotics.      Assessment and Plan: Septic shock due to likely UTI Patient presented with shaking chills, vomiting, fever and leukocytosis.  Lactic acid greater than 5.  BP >20 pts lower than baseline.  CT chest abdomen and pelvis unremarkable, but makes plenty of urine and pyuria noted.  No skin findings to suggest infection - Follow-up blood cultures - Follow urine culture - Continue vancomycin, cefepime, and Flagyl    ESRD on HD -Will consult nephrology for routine HD prior to Tuesday - Continue sevelamer  Hypokalemia -May have regular diet  Anemia of chronic kidney disease Hemoglobin trending slightly down, probably dilution, no bleeding observed or reported - Continue iron  Pancreatic insufficiency - Continue Creon  Mood disorder - Continue sertraline and Seroquel, and mirtazapine  Lung nodules Incidental findings.   - Repeat CT chest in 12-18 months  Pancreatic cyst Incidental finding - Repeat CT abdomen in 12 mnths          Subjective: Patient is feeling gradually better, his appetite is okay, he has no confusion, no skin findings, no new dysuria, no flank pain, no abdominal pain.     Physical Exam: BP 128/88 (BP Location: Left Arm)   Pulse 77   Temp 98.6 F (37 C) (Oral)   Resp 19   Ht 6\' 1"  (1.854 m)   Wt 90.3 kg   SpO2 100%   BMI 26.25 kg/m   Adult male, sitting up in bed, interactive and appropriate RRR, no murmurs, no peripheral edema Respiratory normal, lungs clear  without rales or wheezes Abdomen soft no tenderness palpation or guarding, no ascites or distention Attention normal, affect normal, judgment insight appear normal   Data Reviewed: Potassium up to 3.1, lactic acid down to 2 White blood cell count down to 12 Hemoglobin down to 10 INR normal Urinalysis with pyuria Blood cultures negative  Family Communication: None present    Disposition: Status is: Inpatient  Patient with ESRD, admitted with sepsis with unknown source  Suspect that this is urinary source, will continue empiric antibiotics for now, if culture data negative at 48 hours, likely will discharge home on Levaquin to complete 7 days        Author: Ephriam Hashimoto, MD 01/17/2024 7:01 PM  For on call review [email or web address].

## 2024-01-17 NOTE — Progress Notes (Signed)
 New Admission Note:   Arrival Method: Stretcher Mental Orientation: alert x 4 Telemetry: box 4 Assessment: Completed Skin: see flow sheet IV: rt hand NSL Pain: none Tubes: none Safety Measures: Safety Fall Prevention Plan has been discussed Admission: Completed [facility] Orientation: Patient has been orientated to the room, unit and staff.  Family: none at bedfside  Orders have been reviewed and implemented. Will continue to monitor the patient. Call light has been placed within reach and bed alarm has been activated.   Charls Cooks BSN, RN Phone number: [phone]

## 2024-01-17 NOTE — Progress Notes (Signed)
 Pharmacy Antibiotic Note  Manuel Ortega is a 73 y.o. male admitted on 01/16/2024 with sepsis.  Pharmacy has been consulted for Vancomycin/Cefepime dosing. WBC is elevated. Pt has ESRD on HD TTS. Fever up to 100.9.   Plan: Vancomycin 2000 mg IV x 1, then 1000 mg IV qHD TTS Cefepime 1g IV q24h Trend WBC, temp, HD schedule  F/U infectious work-up Drug levels as indicated   Height: 6\' 1"  (185.4 cm) Weight: 90.3 kg (199 lb) IBW/kg (Calculated) : 79.9  Temp (24hrs), Avg:100.5 F (38.1 C), Min:100 F (37.8 C), Max:100.9 F (38.3 C)  Recent Labs  Lab 01/16/24 1658 01/16/24 1722 01/16/24 2239  WBC 18.6*  --   --   CREATININE 4.59*  --   --   LATICACIDVEN  --  5.2* 4.4*    Estimated Creatinine Clearance: 16.4 mL/min (A) (by C-G formula based on SCr of 4.59 mg/dL (H)).    No Known Allergies  Silvestre Drum, PharmD, BCPS Clinical Pharmacist Phone: [phone]

## 2024-01-17 NOTE — Assessment & Plan Note (Signed)
 Incidental finding - Repeat CT abdomen in 12 mnths

## 2024-01-18 ENCOUNTER — Other Ambulatory Visit (HOSPITAL_COMMUNITY): Payer: Self-pay

## 2024-01-18 ENCOUNTER — Inpatient Hospital Stay (HOSPITAL_COMMUNITY)

## 2024-01-18 DIAGNOSIS — A419 Sepsis, unspecified organism: Secondary | ICD-10-CM | POA: Diagnosis not present

## 2024-01-18 DIAGNOSIS — R6521 Severe sepsis with septic shock: Secondary | ICD-10-CM | POA: Diagnosis not present

## 2024-01-18 LAB — URINE CULTURE: Culture: NO GROWTH

## 2024-01-18 LAB — BASIC METABOLIC PANEL WITH GFR
Anion gap: 13 (ref 5–15)
BUN: 27 mg/dL — ABNORMAL HIGH (ref 8–23)
CO2: 21 mmol/L — ABNORMAL LOW (ref 22–32)
Calcium: 9 mg/dL (ref 8.9–10.3)
Chloride: 104 mmol/L (ref 98–111)
Creatinine, Ser: 6.83 mg/dL — ABNORMAL HIGH (ref 0.61–1.24)
GFR, Estimated: 8 mL/min — ABNORMAL LOW (ref >60)
Glucose, Bld: 96 mg/dL (ref 70–99)
Potassium: 3.6 mmol/L (ref 3.5–5.1)
Sodium: 138 mmol/L (ref 135–145)

## 2024-01-18 LAB — CBC
HCT: 32 % — ABNORMAL LOW (ref 39.0–52.0)
Hemoglobin: 10.4 g/dL — ABNORMAL LOW (ref 13.0–17.0)
MCH: 29.8 pg (ref 26.0–34.0)
MCHC: 32.5 g/dL (ref 30.0–36.0)
MCV: 91.7 fL (ref 80.0–100.0)
Platelets: 180 10*3/uL (ref 150–400)
RBC: 3.49 MIL/uL — ABNORMAL LOW (ref 4.22–5.81)
RDW: 15.4 % (ref 11.5–15.5)
WBC: 7.9 10*3/uL (ref 4.0–10.5)
nRBC: 0 % (ref 0.0–0.2)

## 2024-01-18 MED ORDER — LEVOFLOXACIN 750 MG PO TABS
750.0000 mg | ORAL_TABLET | ORAL | 0 refills | Status: DC
  Filled 2024-01-18: qty 2, 4d supply, fill #0

## 2024-01-18 NOTE — Discharge Summary (Signed)
 Physician Discharge Summary   Patient: Manuel Ortega MRN: [id number] DOB: November 23, 1950  Admit date:     01/16/2024  Discharge date: 01/18/24  Discharge Physician: Ephriam Hashimoto   PCP: Default, Provider, MD     Recommendations at discharge:  Follow up with PCP in 1 week      Discharge Diagnoses: Principal Problem:   Pyelonephritis Active Problems:   ESRD on dialysis [facility])   Pancreatic cyst   Lung nodules   Septic shock ruled out     Hospital Course: Manuel Ortega is a 73 y.o. M with ESRD on HD TThS, prosCA, and pancreatic insufficiency who presented with fever and chills vomiting and dizziness.   Lactic acid >5 mmol/L, WBC 18K.  CT chest abdomen and pelvis showed no acute findings.  Urine with many WBCs.     Admitted on broad spectrum antibiotics.      Pyelonephritis Patient presented with shaking chills, vomiting, fever and leukocytosis. CT chest abdomen and pelvis unremarkable.  Pyuria noted.  No skin findings to suggest infection    Started on broad spectrum antibiotics and symptoms cleared.    Blood and urine cultures no growth at 48 hours.    Discharged to complete 7 days with Levaquin.        Hypokalemia Resolved   Anemia of chronic kidney disease Hemoglobin trending slightly down, probably dilution, no bleeding observed or reported.  On iron outpatient   Pancreatic insufficiency On Creon   Mood disorder On mirtazapine, seroquel   Lung nodules Incidental findings.  Patient aware.  Will repeat imaging in 12 months.   Pancreatic cyst Incidental finding.  Patient aware.  Follows with GI for surveillance.             The [place]  Controlled Substances Registry was reviewed for this patient prior to discharge.  Consultants: None   Disposition: Home Diet recommendation:  Regular diet  DISCHARGE MEDICATION: Allergies as of 01/18/2024   No Known Allergies      Medication List     PAUSE taking these medications    QUEtiapine  Fumarate 150 MG 24 hr tablet Wait to take this until: January 23, 2024 Commonly known as: SEROQUEL XR Take 150 mg by mouth at bedtime.       TAKE these medications    Creon 36000-114000 units Cpep capsule Generic drug: lipase/protease/amylase Take 2 capsules by mouth 3 (three) times daily before meals. Take one capsule for snack in addition to TID   ferrous sulfate 325 (65 FE) MG EC tablet Take 325 mg by mouth daily with breakfast.   levofloxacin 750 MG tablet Commonly known as: Levaquin Take 1 tablet (750 mg total) by mouth every other day. After dialysis   Lumigan 0.01 % Soln Generic drug: bimatoprost Place 1 drop into both eyes at bedtime.   mirtazapine 15 MG tablet Commonly known as: REMERON Take 15 mg by mouth daily.   montelukast 10 MG tablet Commonly known as: SINGULAIR Take 10 mg by mouth daily.   pantoprazole 40 MG tablet Commonly known as: PROTONIX Take 40 mg by mouth 2 (two) times daily.   sertraline 100 MG tablet Commonly known as: ZOLOFT Take 200 mg by mouth daily.   sevelamer carbonate 800 MG tablet Commonly known as: RENVELA Take 1,600 mg by mouth in the morning and at bedtime.   sildenafil 100 MG tablet Commonly known as: VIAGRA Take 100 mg by mouth as needed for erectile dysfunction.   timolol 0.5 % ophthalmic solution Commonly known as:  TIMOPTIC Place 1 drop into both eyes every morning.   zolpidem 10 MG tablet Commonly known as: AMBIEN Take 10 mg by mouth daily as needed for sleep.         Discharge Instructions     Discharge instructions   Complete by: As directed    **IMPORTANT DISCHARGE INSTRUCTIONS**   From Dr. Darlyn Eke: You were admitted for fever.  We are uncertain what your infection was, but suspect it may have been a urinary infection  Here we did a CT of your chest and abdomen and this showed no concerning findings  We collected blood and urine cultures and these showed nothing.   You were treated here with broad  spectrum antibiotics and should complete 4 more days of treatment.  Take levofloxcin/Levaquin 750 mg every 48 hours  Take tomorrow after dialysis, then Thursday after dialysis then stop (this will stay in your system until Saturday, to complete the 7 days)  Resume your other home medicines  Go see your primary doctor in 1 week  Return if you have severe fever, vomiting again, or any new and worsening symptoms  Hold your Seroquel temporarily while you are taking Levaquin.  Then resume on Saturday   Increase activity slowly   Complete by: As directed        Discharge Exam: Filed Weights   01/16/24 1625  Weight: 90.3 kg    General: Pt is alert, awake, not in acute distress Cardiovascular: RRR, nl S1-S2, no murmurs appreciated.   No LE edema.   Respiratory: Normal respiratory rate and rhythm.  CTAB without rales or wheezes. Abdominal: Abdomen soft and non-tender.  No distension or HSM.   Neuro/Psych: Strength symmetric in upper and lower extremities.  Judgment and insight appear normal.   Condition at discharge: good  The results of significant diagnostics from this hospitalization (including imaging, microbiology, ancillary and laboratory) are listed below for reference.   Imaging Studies: DG Foot Complete Left Result Date: 01/18/2024 CLINICAL DATA:  Foot pain. EXAM: LEFT FOOT - COMPLETE 3+ VIEW COMPARISON:  None Available. FINDINGS: There is no evidence of fracture or dislocation. Slight hammertoe deformity of the fifth toe. No erosive change or periostitis. Soft tissue thickening about the medial first metatarsal phalangeal joint. No soft tissue gas or radiopaque foreign body. IMPRESSION: 1. Soft tissue thickening about the medial first metatarsophalangeal joint. No acute osseous abnormality. 2. Slight hammertoe deformity of the fifth toe. Electronically Signed   By: Chadwick Colonel M.D.   On: 01/18/2024 15:10   CT CHEST ABDOMEN PELVIS W CONTRAST Result Date:  01/16/2024 CLINICAL DATA:  Sepsis EXAM: CT CHEST, ABDOMEN, AND PELVIS WITH CONTRAST TECHNIQUE: Multidetector CT imaging of the chest, abdomen and pelvis was performed following the standard protocol during bolus administration of intravenous contrast. RADIATION DOSE REDUCTION: This exam was performed according to the departmental dose-optimization program which includes automated exposure control, adjustment of the mA and/or kV according to patient size and/or use of iterative reconstruction technique. CONTRAST:  75mL OMNIPAQUE IOHEXOL 350 MG/ML SOLN COMPARISON:  None Available. FINDINGS: CT CHEST FINDINGS Cardiovascular: Mild coronary artery calcification. Global cardiac size within normal limits. No pericardial effusion. Central pulmonary arteries are of normal caliber. Mild atherosclerotic calcification within the thoracic aorta. No aortic aneurysm. Right internal jugular hemodialysis catheter tip noted within the superior vena cava. Mediastinum/Nodes: No enlarged mediastinal, hilar, or axillary lymph nodes. Thyroid gland, trachea, and esophagus demonstrate no significant findings. Lungs/Pleura: Stable 6 mm noncalcified pulmonary nodule within the left lower lobe,  axial image # 96/4. Stable 6 mm subpleural right lower lobe pulmonary nodule when accounting for partial atelectasis of the right lower lobe, axial image # 103/4. Lungs a otherwise re clear. No pleural effusion or pneumothorax. Musculoskeletal: No chest wall mass or suspicious bone lesions identified. CT ABDOMEN PELVIS FINDINGS Hepatobiliary: Stable indeterminate low-attenuation lesions within the a patent dome since remote prior examination of 05/11/2020 likely representing cysts given their stability over time. Liver otherwise unremarkable. Gallbladder unremarkable. No intra or extrahepatic biliary ductal dilation. Pancreas: Stable 16 mm cystic lesion within the uncinate process of the pancreas demonstrating coarse calcification possibly the sequela of  remote pancreatitis or a complex cystic neoplasm. Pancreas is otherwise unremarkable. Spleen: Unremarkable Adrenals/Urinary Tract: The adrenal glands are unremarkable. The kidneys are normal in position. Mild bilateral renal cortical atrophy, unchanged. Simple cortical cyst noted within the lower pole of the left kidney for which no follow-up imaging is recommended. Stable moderate nonspecific perinephric stranding. No hydronephrosis. No intrarenal or ureteral calculi. The bladder demonstrates wall trabeculation likely related to changes of chronic bladder outlet obstruction. TURP defect noted within the central prostate gland. Stomach/Bowel: Moderate cecal and sigmoid diverticulosis. Stomach, small bowel, and large bowel are otherwise unremarkable. Appendix normal. No evidence of obstruction or focal inflammation. No free intraperitoneal gas or fluid. Vascular/Lymphatic: Aortic atherosclerosis. No enlarged abdominal or pelvic lymph nodes. Reproductive: The prostate gland is not enlarged. TURP defect noted. Other: No abdominal wall hernia Musculoskeletal: No acute bone abnormality. No lytic or blastic bone lesion. IMPRESSION: 1. No acute intrathoracic or intra-abdominal pathology identified. No definite radiographic explanation for the patient's reported symptoms. 2. Mild coronary artery calcification. 3. Stable 6 mm noncalcified pulmonary nodules within the lower lobes bilaterally. Follow-up evaluation in 12-18 months is recommended to confirm stability and an underlying benign etiology. 4. Stable 16 mm cystic lesion within the uncinate process of the pancreas demonstrating coarse calcification possibly the sequela of remote pancreatitis or a complex cystic neoplasm. Follow-up MRI examination in 1 year is recommended to assess for stability. 5. Moderate cecal and sigmoid diverticulosis. 6. Stable moderate nonspecific perinephric stranding. Correlation with urinalysis may be helpful to exclude the possibility of  pyelonephritis. Aortic Atherosclerosis (ICD10-I70.0). Electronically Signed   By: Worthy Heads M.D.   On: 01/16/2024 23:48   DG Chest Port 1 View Result Date: 01/16/2024 CLINICAL DATA:  Questionable sepsis - evaluate for abnormality Lightheaded.  Dizzy. EXAM: PORTABLE CHEST 1 VIEW COMPARISON:  None Available. FINDINGS: Right-sided dialysis catheter tip overlies the lower SVC. The heart is upper normal in size. Aortic atherosclerosis. Mild atelectasis in both lung bases. No confluent airspace disease. No pulmonary edema, large pleural effusion or evidence of pneumothorax. IMPRESSION: Mild bibasilar atelectasis. Electronically Signed   By: Chadwick Colonel M.D.   On: 01/16/2024 17:10    Microbiology: Results for orders placed or performed during the hospital encounter of 01/16/24  Resp panel by RT-PCR (RSV, Flu A&B, Covid)     Status: None   Collection Time: 01/16/24  4:29 PM   Specimen: Nasal Swab  Result Value Ref Range Status   SARS Coronavirus 2 by RT PCR NEGATIVE NEGATIVE Final   Influenza A by PCR NEGATIVE NEGATIVE Final   Influenza B by PCR NEGATIVE NEGATIVE Final    Comment: (NOTE) The Xpert Xpress SARS-CoV-2/FLU/RSV plus assay is intended as an aid in the diagnosis of influenza from Nasopharyngeal swab specimens and should not be used as a sole basis for treatment. Nasal washings and aspirates are unacceptable for Xpert  Xpress SARS-CoV-2/FLU/RSV testing.  Fact Sheet for Patients: BloggerCourse.com  Fact Sheet for Healthcare Providers: SeriousBroker.it  This test is not yet approved or cleared by the [place]  FDA and has been authorized for detection and/or diagnosis of SARS-CoV-2 by FDA under an Emergency Use Authorization (EUA). This EUA will remain in effect (meaning this test can be used) for the duration of the COVID-19 declaration under Section 564(b)(1) of the Act, 21 U.S.C. section 360bbb-3(b)(1), unless the  authorization is terminated or revoked.     Resp Syncytial Virus by PCR NEGATIVE NEGATIVE Final    Comment: (NOTE) Fact Sheet for Patients: BloggerCourse.com  Fact Sheet for Healthcare Providers: SeriousBroker.it  This test is not yet approved or cleared by the [place]  FDA and has been authorized for detection and/or diagnosis of SARS-CoV-2 by FDA under an Emergency Use Authorization (EUA). This EUA will remain in effect (meaning this test can be used) for the duration of the COVID-19 declaration under Section 564(b)(1) of the Act, 21 U.S.C. section 360bbb-3(b)(1), unless the authorization is terminated or revoked.  Performed at [facility], [address] [place], [place] [address]   Blood Culture (routine x 2)     Status: None (Preliminary result)   Collection Time: 01/16/24  4:34 PM   Specimen: BLOOD  Result Value Ref Range Status   Specimen Description BLOOD SITE NOT SPECIFIED  Final   Special Requests   Final    BOTTLES DRAWN AEROBIC AND ANAEROBIC Blood Culture results may not be optimal due to an inadequate volume of blood received in culture bottles   Culture   Final    NO GROWTH 2 DAYS Performed at [facility], [address] [place], [place] [address]    Report Status PENDING  Incomplete  Blood Culture (routine x 2)     Status: None (Preliminary result)   Collection Time: 01/16/24  4:54 PM   Specimen: BLOOD  Result Value Ref Range Status   Specimen Description BLOOD SITE NOT SPECIFIED  Final   Special Requests   Final    BOTTLES DRAWN AEROBIC AND ANAEROBIC Blood Culture results may not be optimal due to an inadequate volume of blood received in culture bottles   Culture   Final    NO GROWTH 2 DAYS Performed at [facility], [address], [place], [place] [address]    Report Status PENDING  Incomplete  MRSA Next Gen by PCR, Nasal     Status: None   Collection Time: 01/17/24  5:12 AM    Specimen: Nasal Mucosa; Nasal Swab  Result Value Ref Range Status   MRSA by PCR Next Gen NOT DETECTED NOT DETECTED Final    Comment: (NOTE) The GeneXpert MRSA Assay (FDA approved for NASAL specimens only), is one component of a comprehensive MRSA colonization surveillance program. It is not intended to diagnose MRSA infection nor to guide or monitor treatment for MRSA infections. Test performance is not FDA approved in patients less than 49 years old. Performed at [facility], [address] [place], [place] [address]   Urine Culture (for pregnant, neutropenic or urologic patients or patients with an indwelling urinary catheter)     Status: None   Collection Time: 01/17/24  7:07 PM   Specimen: Urine, Clean Catch  Result Value Ref Range Status   Specimen Description URINE, CLEAN CATCH  Final   Special Requests NONE  Final   Culture   Final    NO GROWTH  Performed at [facility], [address], [place], [place] [address]    Report Status 01/18/2024 FINAL  Final    Labs: CBC: Recent Labs  Lab 01/16/24 1658 01/17/24 0437 01/18/24 0500  WBC 18.6* 12.0* 7.9  NEUTROABS 16.0*  --   --   HGB 12.2* 10.3* 10.4*  HCT 37.4* 31.2* 32.0*  MCV 89.9 90.4 91.7  PLT 241 189 180   Basic Metabolic Panel: Recent Labs  Lab 01/16/24 1658 01/17/24 0437 01/18/24 0500  NA 137 138 138  K 2.7* 3.1* 3.6  CL 96* 101 104  CO2 23 25 21*  GLUCOSE 120* 105* 96  BUN 13 19 27*  CREATININE 4.59* 5.33* 6.83*  CALCIUM 9.3 8.6* 9.0   Liver Function Tests: Recent Labs  Lab 01/16/24 1658  AST 19  ALT 10  ALKPHOS 58  BILITOT 0.8  PROT 7.0  ALBUMIN 4.1   CBG: No results for input(s): "GLUCAP" in the last 168 hours.  Discharge time spent: approximately 45 minutes spent on discharge counseling, evaluation of patient on day of discharge, and coordination of discharge planning with nursing, social work, pharmacy and case management  Signed: Ephriam Hashimoto, MD [facility] 01/18/2024

## 2024-01-18 NOTE — Progress Notes (Signed)
 DISCHARGE NOTE HOME Manuel Ortega to be discharged Home per MD order. Discussed prescriptions and follow up appointments with the patient. Prescriptions given to patient; medication list explained in detail. Patient verbalized understanding.  Skin clean, dry and intact without evidence of skin break down, no evidence of skin tears noted. IV catheter discontinued intact. Site without signs and symptoms of complications. Dressing and pressure applied. Pt denies pain at the site currently. No complaints noted.  Discharging with HD access.  An After Visit Summary (AVS) was printed and given to the patient. Patient escorted via wheelchair, and discharged home via private auto.  Tonda Francisco, RN

## 2024-01-18 NOTE — Plan of Care (Signed)

## 2024-01-18 NOTE — TOC Transition Note (Addendum)
 Transition of Care [facility]) - Discharge Note   Patient Details  Name: Manuel Ortega MRN: [id number] Date of Birth: 09-24-1951  [facility] CM/SW Contact:  Tom-Johnson, Angelique Ken, RN Phone Number: 01/18/2024, 4:08 PM   Clinical Narrative:     Patient is scheduled for discharge today.  Readmission Risk Assessment done. Discharge instructions on AVS. Prescriptions sent to [facility] pharmacy and patient will receive meds prior discharge. XL pants given to patient from [facility] closet per patient's request.  Resources for Life Alert given to patient at bedside.  Patient requested for a cab, Therapist, nutritional and Release of Liability form explained to patient with understanding verbalized, form signed and placed in patient's chart. Cab voucher given to RN. No further TOC needs noted.        Final next level of care: Home/Self Care Barriers to Discharge: Barriers Resolved   Patient Goals and CMS Choice Patient states their goals for this hospitalization and ongoing recovery are:: To return home CMS Medicare.gov Compare Post Acute Care list provided to:: Patient Choice offered to / list presented to : Patient      Discharge Placement                Patient to be transferred to facility by: Cab voucher given      Discharge Plan and Services Additional resources added to the After Visit Summary for                  DME Arranged: N/A DME Agency: NA       HH Arranged: NA HH Agency: NA        Social Drivers of Health (SDOH) Interventions SDOH Screenings   Food Insecurity: No Food Insecurity (01/17/2024)  Housing: Low Risk  (01/17/2024)  Transportation Needs: No Transportation Needs (01/17/2024)  Utilities: Not At Risk (01/17/2024)  Social Connections: Moderately Isolated (01/17/2024)  Tobacco Use: Low Risk  (01/17/2024)     Readmission Risk Interventions    01/18/2024    4:03 PM  Readmission Risk Prevention Plan  Transportation Screening Complete  PCP or  Specialist Appt within 5-7 Days Complete  Home Care Screening Complete  Medication Review (RN CM) Referral to Pharmacy

## 2024-01-19 ENCOUNTER — Encounter (HOSPITAL_COMMUNITY): Payer: Self-pay | Admitting: Gastroenterology

## 2024-01-19 ENCOUNTER — Telehealth: Payer: Self-pay | Admitting: Physician Assistant

## 2024-01-19 NOTE — Telephone Encounter (Signed)
 Copied from CRM [id number]. Topic: Appointments - Scheduling Inquiry for Clinic >> Jan 19, 2024  8:56 AM Abundio Miu S wrote: Reason for CRM: Patient needs to be scheduled for a new pt, hospital f/u visit. Patient states he was discharged from hospital on 01/18/2024. Providers's schedule shows no availability until June. Please contact patient for scheduling. Callback # [phone]

## 2024-01-19 NOTE — Telephone Encounter (Signed)
 Done

## 2024-01-21 ENCOUNTER — Encounter: Payer: Self-pay | Admitting: Podiatry

## 2024-01-21 ENCOUNTER — Ambulatory Visit: Admitting: Podiatry

## 2024-01-21 DIAGNOSIS — M79674 Pain in right toe(s): Secondary | ICD-10-CM | POA: Diagnosis not present

## 2024-01-21 DIAGNOSIS — B351 Tinea unguium: Secondary | ICD-10-CM

## 2024-01-21 DIAGNOSIS — M79675 Pain in left toe(s): Secondary | ICD-10-CM | POA: Diagnosis not present

## 2024-01-21 DIAGNOSIS — L84 Corns and callosities: Secondary | ICD-10-CM

## 2024-01-21 LAB — CULTURE, BLOOD (ROUTINE X 2)
Culture: NO GROWTH
Culture: NO GROWTH

## 2024-01-22 ENCOUNTER — Ambulatory Visit (INDEPENDENT_AMBULATORY_CARE_PROVIDER_SITE_OTHER): Admitting: Family

## 2024-01-22 ENCOUNTER — Encounter: Payer: Self-pay | Admitting: Family

## 2024-01-22 VITALS — BP 134/77 | HR 91 | Temp 99.1°F | Resp 18 | Ht 73.0 in | Wt 213.0 lb

## 2024-01-22 DIAGNOSIS — R6521 Severe sepsis with septic shock: Secondary | ICD-10-CM

## 2024-01-22 DIAGNOSIS — N186 End stage renal disease: Secondary | ICD-10-CM | POA: Diagnosis not present

## 2024-01-22 DIAGNOSIS — A419 Sepsis, unspecified organism: Secondary | ICD-10-CM

## 2024-01-22 DIAGNOSIS — Z09 Encounter for follow-up examination after completed treatment for conditions other than malignant neoplasm: Secondary | ICD-10-CM

## 2024-01-22 DIAGNOSIS — R918 Other nonspecific abnormal finding of lung field: Secondary | ICD-10-CM

## 2024-01-22 DIAGNOSIS — K862 Cyst of pancreas: Secondary | ICD-10-CM | POA: Diagnosis not present

## 2024-01-22 DIAGNOSIS — Z7689 Persons encountering health services in other specified circumstances: Secondary | ICD-10-CM

## 2024-01-22 DIAGNOSIS — Z992 Dependence on renal dialysis: Secondary | ICD-10-CM

## 2024-01-22 DIAGNOSIS — N12 Tubulo-interstitial nephritis, not specified as acute or chronic: Secondary | ICD-10-CM

## 2024-01-22 NOTE — Progress Notes (Signed)
 Procedure to be done by [facility] and not [facility].  Patient trying to change everything to [facility].  Patient has marks on his pancreas and left lung.  Patient is trying to get a life necklace.  Patient needs help with cleaning his house.  Patient is on hemodialysis 3 times a week.  Patient urinates normally.  Kidneys are just not cleansing the blood like it should.

## 2024-01-22 NOTE — Progress Notes (Signed)
 Patient ID: Manuel Ortega, male    DOB: 1951/08/12  MRN: [id number]  CC: Establish Care and Hospital Discharge Follow-Up  Subjective: Manuel Ortega is a 73 y.o. male who presents for establish care and hospital discharge follow-up.   His concerns today include:  01/16/2024 - 01/18/2024 [facility] per MD note:  Recommendations at discharge:  Follow up with PCP in 1 week           Discharge Diagnoses: Principal Problem:   Pyelonephritis Active Problems:   ESRD on dialysis [facility])   Pancreatic cyst   Lung nodules   Septic shock ruled out   Hospital Course: Manuel Ortega is a 73 y.o. M with ESRD on HD TThS, prosCA, and pancreatic insufficiency who presented with fever and chills vomiting and dizziness.   Lactic acid >5 mmol/L, WBC 18K.  CT chest abdomen and pelvis showed no acute findings.  Urine with many WBCs.     Admitted on broad spectrum antibiotics.        Pyelonephritis Patient presented with shaking chills, vomiting, fever and leukocytosis. CT chest abdomen and pelvis unremarkable.   Pyuria noted.  No skin findings to suggest infection     Started on broad spectrum antibiotics and symptoms cleared.    Blood and urine cultures no growth at 48 hours.    Discharged to complete 7 days with Levaquin.        Hypokalemia Resolved   Anemia of chronic kidney disease Hemoglobin trending slightly down, probably dilution, no bleeding observed or reported.  On iron outpatient   Pancreatic insufficiency On Creon   Mood disorder On mirtazapine, seroquel   Lung nodules Incidental findings.  Patient aware.  Will repeat imaging in 12 months.   Pancreatic cyst Incidental finding.  Patient aware.  Follows with GI for surveillance.   Today's office visit 01/22/2024: Patient reports feeling improved since hospital discharge. He denies red flag symptoms. Doing well on medication regimen, no issues/concerns. States he is established with specialists located in  [place], [place] and wants all specialists transferred to [facility]. 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 No further issues/concerns for discussion today.  Patient Active Problem List   Diagnosis Date Noted   Pancreatic cyst 01/17/2024   Lung nodules 01/17/2024   Sepsis (HCC) 01/16/2024   ESRD on dialysis (HCC) 01/16/2024   Mucosal abnormality of stomach 10/15/2023   Gastric polyp 10/15/2023   Duodenitis 10/15/2023   Duodenal ulcer 10/15/2023   Acute blood loss anemia 10/13/2023   Melanotic stools 10/13/2023   SOB (shortness of breath) 10/11/2023   Pulmonary nodule 10/09/2023   Pre-syncope 10/09/2023   Dyspnea on exertion 10/09/2023   Anxiety 10/09/2023   Pancreatic cyst 10/09/2023   Prostate cancer (HCC) 11/23/2020   ESRD on dialysis (HCC) 10/31/2020   Bladder outlet obstruction 09/13/2020   Secondary hyperparathyroidism of renal origin (HCC) 07/30/2020   COVID-19 07/16/2020   Iron deficiency anemia, unspecified 06/26/2020   UTI (urinary tract infection) 06/12/2020   Essential hypertension 06/11/2020   Acute kidney injury (AKI) with acute tubular necrosis (ATN) (HCC) 06/11/2020   Gross hematuria 06/11/2020   Major depressive disorder 06/11/2020   D-dimer, elevated 06/11/2020   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   Prolonged QT interval 06/11/2020   Urinary tract infection associated with catheterization of urinary tract, initial encounter (HCC) 06/10/2020   Moderate protein-calorie malnutrition (HCC) 06/05/2020   Allergy, unspecified, sequela 06/02/2020   Anemia in chronic kidney disease 06/02/2020  Coagulation defect, unspecified (HCC) 06/02/2020   Encounter for immunization 06/02/2020   Pain, unspecified 06/02/2020   Personal history of anaphylaxis 06/02/2020   Acute kidney injury (HCC) 05/11/2020   Hypokalemia 05/11/2020   Hypocalcemia 05/11/2020   Hypomagnesemia 05/11/2020   Nausea and vomiting 05/11/2020   Primary  open angle glaucoma (POAG) of both eyes, severe stage 08/13/2017     Current Outpatient Medications on File Prior to Visit  Medication Sig Dispense Refill   calcium acetate (PHOSLO) 667 MG capsule Take 667-1,334 mg by mouth See admin instructions. Take 1,334 mg by mouth once a day and 667 mg in the evening     calcium carbonate (TUMS) 500 MG chewable tablet Chew 1 tablet by mouth 3 (three) times daily as needed for indigestion or heartburn.     CREON 36000-114000 units CPEP capsule Take 36,000-72,000 Units by mouth See admin instructions. Take 72,000 units by mouth three time a day with meals and 36,000 units three times a day with snacks     CREON 36000-114000 units CPEP capsule Take 2 capsules by mouth 3 (three) times daily before meals. Take one capsule for snack in addition to TID     diphenhydrAMINE (BENADRYL) 25 mg capsule Take 25 mg by mouth every 4 (four) hours as needed for itching or allergies.     ferrous sulfate 325 (65 FE) MG EC tablet Take 325 mg by mouth daily with breakfast.     ipratropium (ATROVENT) 0.03 % nasal spray Place 2 sprays into both nostrils every 12 (twelve) hours as needed for rhinitis.     levofloxacin (LEVAQUIN) 750 MG tablet Take 1 tablet (750 mg total) by mouth every other day. After dialysis 2 tablet 0   LUMIGAN 0.01 % SOLN Place 1 drop into both eyes at bedtime.     LUMIGAN 0.01 % SOLN Place 1 drop into both eyes at bedtime.     mirtazapine (REMERON) 15 MG tablet Take 15 mg by mouth at bedtime.     mirtazapine (REMERON) 15 MG tablet Take 15 mg by mouth daily.     montelukast (SINGULAIR) 10 MG tablet Take 10 mg by mouth at bedtime.     montelukast (SINGULAIR) 10 MG tablet Take 10 mg by mouth daily.     ondansetron (ZOFRAN-ODT) 8 MG disintegrating tablet Take 8 mg by mouth every 8 (eight) hours as needed for nausea or vomiting (dissolve orally).     pantoprazole (PROTONIX) 40 MG tablet Take 1 tablet (40 mg total) by mouth 2 (two) times daily. 60 tablet 1    pantoprazole (PROTONIX) 40 MG tablet Take 40 mg by mouth 2 (two) times daily.     QUEtiapine (SEROQUEL) 100 MG tablet Take 200 mg by mouth at bedtime.     [Paused] QUEtiapine Fumarate (SEROQUEL XR) 150 MG 24 hr tablet Take 150 mg by mouth at bedtime.     sertraline (ZOLOFT) 100 MG tablet Take 200 mg by mouth in the morning.     sertraline (ZOLOFT) 100 MG tablet Take 200 mg by mouth daily.     sevelamer carbonate (RENVELA) 800 MG tablet Take 1,600 mg by mouth in the morning and at bedtime.     sildenafil (VIAGRA) 100 MG tablet Take 100 mg by mouth daily as needed for erectile dysfunction.     sildenafil (VIAGRA) 100 MG tablet Take 100 mg by mouth as needed for erectile dysfunction.     timolol (BETIMOL) 0.25 % ophthalmic solution Place 1 drop into both eyes in  the morning.     timolol (TIMOPTIC) 0.5 % ophthalmic solution Place 1 drop into both eyes every morning.     TYLENOL 500 MG tablet Take 500-1,000 mg by mouth every 6 (six) hours as needed for mild pain (pain score 1-3) or headache.     zolpidem (AMBIEN) 10 MG tablet Take 10 mg by mouth at bedtime as needed for sleep.     zolpidem (AMBIEN) 10 MG tablet Take 10 mg by mouth daily as needed for sleep.     azelastine (ASTELIN) 0.1 % nasal spray Place 1 spray into both nostrils 2 (two) times daily as needed for rhinitis or allergies.     No current facility-administered medications on file prior to visit.    No Known Allergies  Social History   Socioeconomic History   Marital status: Divorced    Spouse name: Not on file   Number of children: Not on file   Years of education: Not on file   Highest education level: Not on file  Occupational History   Occupation: Art gallery manager  Tobacco Use   Smoking status: Never    Passive exposure: Never   Smokeless tobacco: Never  Vaping Use   Vaping status: Never Used  Substance and Sexual Activity   Alcohol use: Not Currently    Alcohol/week: 1.0 standard drink of alcohol    Types: 1 Glasses of  wine per week    Comment: rare   Drug use: Not Currently   Sexual activity: Not Currently    Birth control/protection: None  Other Topics Concern   Not on file  Social History Narrative   ** Merged History Encounter **       Right handed Drinks caffeine Lives here in [place] for 6 years, came from [place] Lives alone   Social Drivers of Health   Financial Resource Strain: Low Risk  (11/10/2023)   Received from [facility]   Overall Financial Resource Strain (CARDIA)    Difficulty of Paying Living Expenses: Not hard at all  Food Insecurity: No Food Insecurity (01/17/2024)   Hunger Vital Sign    Worried About Running Out of Food in the Last Year: Never true    Ran Out of Food in the Last Year: Never true  Transportation Needs: No Transportation Needs (01/17/2024)   PRAPARE - Administrator, Civil Service (Medical): No    Lack of Transportation (Non-Medical): No  Physical Activity: Sufficiently Active (08/21/2022)   Received from [facility]   Exercise Vital Sign    Days of Exercise per Week: 4 days    Minutes of Exercise per Session: 50 min  Stress: No Stress Concern Present (01/22/2024)   [employer] of Occupational Health - Occupational Stress Questionnaire    Feeling of Stress : Not at all  Social Connections: Moderately Isolated (01/17/2024)   Social Connection and Isolation Panel [NHANES]    Frequency of Communication with Friends and Family: More than three times a week    Frequency of Social Gatherings with Friends and Family: More than three times a week    Attends Religious Services: Never    Database administrator or Organizations: No    Attends Engineer, structural: More than 4 times per year    Marital Status: Divorced  Intimate Partner Violence: Not At Risk (01/17/2024)   Humiliation, Afraid, Rape, and Kick questionnaire    Fear of Current or Ex-Partner: No    Emotionally Abused: No    Physically  Abused: No    Sexually  Abused: No    Family History  Problem Relation Age of Onset   Stroke Mother    Breast cancer Neg Hx    Colon cancer Neg Hx    Prostate cancer Neg Hx    Pancreatic cancer Neg Hx     Past Surgical History:  Procedure Laterality Date   BIOPSY  10/15/2023   Procedure: BIOPSY;  Surgeon: Benancio Deeds, MD;  Location: [facility] ENDOSCOPY;  Service: Gastroenterology;;   DIALYSIS/PERMA CATHETER INSERTION N/A 09/23/2023   Procedure: DIALYSIS/PERMA CATHETER INSERTION;  Surgeon: Ethelene Hal, MD;  Location: [facility] INVASIVE CV LAB;  Service: Cardiovascular;  Laterality: N/A;   DIALYSIS/PERMA CATHETER REMOVAL N/A 09/23/2023   Procedure: DIALYSIS/PERMA CATHETER REMOVAL;  Surgeon: Ethelene Hal, MD;  Location: [facility] INVASIVE CV LAB;  Service: Cardiovascular;  Laterality: N/A;   ESOPHAGOGASTRODUODENOSCOPY (EGD) WITH PROPOFOL N/A 10/15/2023   Procedure: ESOPHAGOGASTRODUODENOSCOPY (EGD) WITH PROPOFOL;  Surgeon: Benancio Deeds, MD;  Location: [facility] ENDOSCOPY;  Service: Gastroenterology;  Laterality: N/A;   growth in nostril     biopsied negatiove   IR FLUORO GUIDE CV LINE RIGHT  05/12/2020   IR FLUORO GUIDE CV LINE RIGHT  05/28/2020   IR US GUIDE VASC ACCESS RIGHT  05/12/2020   IR US GUIDE VASC ACCESS RIGHT  05/28/2020   TRANSURETHRAL RESECTION OF PROSTATE N/A 09/13/2020   Procedure: TRANSURETHRAL RESECTION OF THE PROSTATE (TURP) WITH CYSTOSCOPY;  Surgeon: Heloise Purpura, MD;  Location: [facility] ORS;  Service: Urology;  Laterality: N/A;    ROS: Review of Systems Negative except as stated above  PHYSICAL EXAM: BP 134/77   Pulse 91   Temp 99.1 F (37.3 C) (Oral)   Resp 18   Ht 6\' 1"  (1.854 m)   Wt 213 lb (96.6 kg)   SpO2 96%   BMI 28.10 kg/m   Physical Exam HENT:     Head: Normocephalic and atraumatic.     Nose: Nose normal.     Mouth/Throat:     Mouth: Mucous membranes are moist.     Pharynx: Oropharynx is clear.  Eyes:     Extraocular Movements: Extraocular movements intact.     Conjunctiva/sclera:  Conjunctivae normal.     Pupils: Pupils are equal, round, and reactive to light.  Cardiovascular:     Rate and Rhythm: Normal rate and regular rhythm.     Pulses: Normal pulses.     Heart sounds: Normal heart sounds.  Pulmonary:     Effort: Pulmonary effort is normal.     Breath sounds: Normal breath sounds.  Musculoskeletal:        General: Normal range of motion.     Cervical back: Normal range of motion and neck supple.  Neurological:     General: No focal deficit present.     Mental Status: He is alert and oriented to person, place, and time.  Psychiatric:        Mood and Affect: Mood normal.        Behavior: Behavior normal.     ASSESSMENT AND PLAN: 1. Encounter to establish care (Primary) - Patient presents today to establish care. During the interim follow-up with primary provider as scheduled.  - Return for annual physical examination, labs, and health maintenance. Arrive fasting meaning having no food for at least 8 hours prior to appointment. You may have only water or black coffee. Please take scheduled medications as normal.  2. Hospital discharge follow-up - Reviewed hospital course, current medications,  ensured proper follow-up in place, and addressed concerns.   3. Pyelonephritis - Referral to Nephrology for evaluation/management. - Ambulatory referral to Nephrology  4. ESRD on dialysis [facility]) - Referral to Nephrology for evaluation/management. - Ambulatory referral to Nephrology  5. Pancreatic cyst - Referral to Gastroenterology for evaluation/management. - Ambulatory referral to Gastroenterology  6. Lung nodules - Referral to Pulmonology for evaluation/management. - Ambulatory referral to Pulmonology  7. Septic shock (HCC) - Septic shock ruled out per hospital discharge note.    Patient was given the opportunity to ask questions.  Patient verbalized understanding of the plan and was able to repeat key elements of the plan. Patient was given clear  instructions to go to Emergency Department or return to medical center if symptoms don't improve, worsen, or new problems develop.The patient verbalized understanding.   Orders Placed This Encounter  Procedures   Ambulatory referral to Gastroenterology   Ambulatory referral to Nephrology   Ambulatory referral to Pulmonology   Follow-up with primary provider as scheduled.  Rema Fendt, NP

## 2024-01-24 NOTE — Progress Notes (Signed)
  Subjective:  Patient ID: Manuel Ortega, male    DOB: 1951/07/03,  MRN: [id number]  Chief Complaint  Patient presents with   Nail Problem    Patient states that his right hallux toe nail has fungus and it hurts him bad , he also states that he has a Callous and it is causing him pain very sharp pain     73 y.o. male presents with the above complaint. History confirmed with patient.  Most of the nails bother him but the right great toe is the worst.  The callus is quite pain.  Objective:  Physical Exam: warm, good capillary refill, no trophic changes or ulcerative lesions, normal DP and PT pulses, and normal sensory exam. Left Foot: dystrophic yellowed discolored nail plates with subungual debris and severe hyperkeratosis medial first MTPJ Right Foot: dystrophic yellowed discolored nail plates with subungual debris    Assessment:   1. Pain due to onychomycosis of toenails of both feet   2. Callus of foot      Plan:  Patient was evaluated and treated and all questions answered.  Discussed the etiology and treatment options for the condition in detail with the patient. Educated patient on the topical and oral treatment options for mycotic nails. Recommended debridement of the nails today. Sharp and mechanical debridement performed of all painful and mycotic nails today. Nails debrided in length and thickness using a nail nipper to level of comfort. Discussed treatment options including appropriate shoe gear. Follow up as needed for painful nails.    All symptomatic hyperkeratoses were safely debrided as courtesy today with a sterile #15 blade to patient's level of comfort without incident. We discussed preventative and palliative care of these lesions including supportive and accommodative shoegear, padding, use of a pumice stone and lotions/creams daily.        Return if symptoms worsen or fail to improve.

## 2024-01-25 ENCOUNTER — Telehealth: Payer: Self-pay | Admitting: Pediatrics

## 2024-01-25 NOTE — Telephone Encounter (Signed)
 Good afternoon Dr. Yvone Herd  The following patient is being referred to us  for a pancreatic cyst. He has been a patient of [facility] and wants to have the procedure done in [place] as well as move all his doctors to the [facility] umbrella. Records are available in Epic with care everywhere. Please review and advise of scheduling. Thank you.

## 2024-02-02 ENCOUNTER — Ambulatory Visit: Admitting: Acute Care

## 2024-02-04 ENCOUNTER — Emergency Department (HOSPITAL_COMMUNITY)

## 2024-02-04 ENCOUNTER — Inpatient Hospital Stay (HOSPITAL_COMMUNITY)

## 2024-02-04 ENCOUNTER — Ambulatory Visit
Admission: EM | Admit: 2024-02-04 | Discharge: 2024-02-04 | Disposition: A | Discharge status: Home / Self Care | Attending: Family Medicine | Admitting: Family Medicine

## 2024-02-04 ENCOUNTER — Other Ambulatory Visit: Payer: Self-pay

## 2024-02-04 ENCOUNTER — Inpatient Hospital Stay (HOSPITAL_COMMUNITY)
Admission: EM | Admit: 2024-02-04 | Discharge: 2024-02-10 | DRG: 314 | Disposition: A | Discharge status: Home / Self Care | Attending: Internal Medicine | Admitting: Internal Medicine

## 2024-02-04 DIAGNOSIS — Z79899 Other long term (current) drug therapy: Secondary | ICD-10-CM | POA: Diagnosis not present

## 2024-02-04 DIAGNOSIS — Z8546 Personal history of malignant neoplasm of prostate: Secondary | ICD-10-CM

## 2024-02-04 DIAGNOSIS — Z992 Dependence on renal dialysis: Secondary | ICD-10-CM | POA: Diagnosis not present

## 2024-02-04 DIAGNOSIS — R6520 Severe sepsis without septic shock: Secondary | ICD-10-CM

## 2024-02-04 DIAGNOSIS — H6121 Impacted cerumen, right ear: Secondary | ICD-10-CM | POA: Diagnosis present

## 2024-02-04 DIAGNOSIS — Z9079 Acquired absence of other genital organ(s): Secondary | ICD-10-CM | POA: Diagnosis not present

## 2024-02-04 DIAGNOSIS — F32A Depression, unspecified: Secondary | ICD-10-CM | POA: Diagnosis present

## 2024-02-04 DIAGNOSIS — R6883 Chills (without fever): Secondary | ICD-10-CM | POA: Diagnosis not present

## 2024-02-04 DIAGNOSIS — K8689 Other specified diseases of pancreas: Secondary | ICD-10-CM | POA: Diagnosis present

## 2024-02-04 DIAGNOSIS — H9319 Tinnitus, unspecified ear: Secondary | ICD-10-CM | POA: Diagnosis present

## 2024-02-04 DIAGNOSIS — R918 Other nonspecific abnormal finding of lung field: Secondary | ICD-10-CM

## 2024-02-04 DIAGNOSIS — D631 Anemia in chronic kidney disease: Secondary | ICD-10-CM | POA: Diagnosis present

## 2024-02-04 DIAGNOSIS — Z1152 Encounter for screening for COVID-19: Secondary | ICD-10-CM

## 2024-02-04 DIAGNOSIS — N186 End stage renal disease: Secondary | ICD-10-CM | POA: Diagnosis present

## 2024-02-04 DIAGNOSIS — I10 Essential (primary) hypertension: Secondary | ICD-10-CM | POA: Diagnosis present

## 2024-02-04 DIAGNOSIS — M898X9 Other specified disorders of bone, unspecified site: Secondary | ICD-10-CM | POA: Diagnosis present

## 2024-02-04 DIAGNOSIS — I120 Hypertensive chronic kidney disease with stage 5 chronic kidney disease or end stage renal disease: Secondary | ICD-10-CM | POA: Diagnosis present

## 2024-02-04 DIAGNOSIS — Z823 Family history of stroke: Secondary | ICD-10-CM

## 2024-02-04 DIAGNOSIS — T80219A Unspecified infection due to central venous catheter, initial encounter: Secondary | ICD-10-CM | POA: Diagnosis present

## 2024-02-04 DIAGNOSIS — A419 Sepsis, unspecified organism: Secondary | ICD-10-CM

## 2024-02-04 DIAGNOSIS — F419 Anxiety disorder, unspecified: Secondary | ICD-10-CM | POA: Diagnosis present

## 2024-02-04 DIAGNOSIS — Z5986 Financial insecurity: Secondary | ICD-10-CM | POA: Diagnosis not present

## 2024-02-04 DIAGNOSIS — J302 Other seasonal allergic rhinitis: Secondary | ICD-10-CM | POA: Diagnosis present

## 2024-02-04 DIAGNOSIS — R509 Fever, unspecified: Secondary | ICD-10-CM | POA: Diagnosis not present

## 2024-02-04 DIAGNOSIS — Y831 Surgical operation with implant of artificial internal device as the cause of abnormal reaction of the patient, or of later complication, without mention of misadventure at the time of the procedure: Secondary | ICD-10-CM | POA: Diagnosis present

## 2024-02-04 DIAGNOSIS — R911 Solitary pulmonary nodule: Secondary | ICD-10-CM | POA: Diagnosis present

## 2024-02-04 LAB — CBC WITH DIFFERENTIAL/PLATELET
Abs Immature Granulocytes: 0.05 10*3/uL (ref 0.00–0.07)
Basophils Absolute: 0 10*3/uL (ref 0.0–0.1)
Basophils Relative: 0 %
Eosinophils Absolute: 0 10*3/uL (ref 0.0–0.5)
Eosinophils Relative: 0 %
HCT: 35.5 % — ABNORMAL LOW (ref 39.0–52.0)
Hemoglobin: 11.7 g/dL — ABNORMAL LOW (ref 13.0–17.0)
Immature Granulocytes: 1 %
Lymphocytes Relative: 8 %
Lymphs Abs: 0.9 10*3/uL (ref 0.7–4.0)
MCH: 29.7 pg (ref 26.0–34.0)
MCHC: 33 g/dL (ref 30.0–36.0)
MCV: 90.1 fL (ref 80.0–100.0)
Monocytes Absolute: 0.8 10*3/uL (ref 0.1–1.0)
Monocytes Relative: 7 %
Neutro Abs: 9.1 10*3/uL — ABNORMAL HIGH (ref 1.7–7.7)
Neutrophils Relative %: 84 %
Platelets: 241 10*3/uL (ref 150–400)
RBC: 3.94 MIL/uL — ABNORMAL LOW (ref 4.22–5.81)
RDW: 14.4 % (ref 11.5–15.5)
WBC: 10.8 10*3/uL — ABNORMAL HIGH (ref 4.0–10.5)
nRBC: 0 % (ref 0.0–0.2)

## 2024-02-04 LAB — URINALYSIS, W/ REFLEX TO CULTURE (INFECTION SUSPECTED)
Bilirubin Urine: NEGATIVE
Glucose, UA: NEGATIVE mg/dL
Ketones, ur: NEGATIVE mg/dL
Leukocytes,Ua: NEGATIVE
Nitrite: NEGATIVE
Protein, ur: 100 mg/dL — AB
Specific Gravity, Urine: 1.009 (ref 1.005–1.030)
pH: 8 (ref 5.0–8.0)

## 2024-02-04 LAB — RESP PANEL BY RT-PCR (RSV, FLU A&B, COVID)  RVPGX2
Influenza A by PCR: NEGATIVE
Influenza B by PCR: NEGATIVE
Resp Syncytial Virus by PCR: NEGATIVE
SARS Coronavirus 2 by RT PCR: NEGATIVE

## 2024-02-04 LAB — COMPREHENSIVE METABOLIC PANEL WITH GFR
ALT: 10 U/L (ref 0–44)
AST: 18 U/L (ref 15–41)
Albumin: 4 g/dL (ref 3.5–5.0)
Alkaline Phosphatase: 66 U/L (ref 38–126)
Anion gap: 13 (ref 5–15)
BUN: 50 mg/dL — ABNORMAL HIGH (ref 8–23)
CO2: 22 mmol/L (ref 22–32)
Calcium: 9.4 mg/dL (ref 8.9–10.3)
Chloride: 101 mmol/L (ref 98–111)
Creatinine, Ser: 5.66 mg/dL — ABNORMAL HIGH (ref 0.61–1.24)
GFR, Estimated: 10 mL/min — ABNORMAL LOW (ref >60)
Glucose, Bld: 108 mg/dL — ABNORMAL HIGH (ref 70–99)
Potassium: 3.3 mmol/L — ABNORMAL LOW (ref 3.5–5.1)
Sodium: 136 mmol/L (ref 135–145)
Total Bilirubin: 0.6 mg/dL (ref 0.0–1.2)
Total Protein: 7.5 g/dL (ref 6.5–8.1)

## 2024-02-04 LAB — I-STAT CG4 LACTIC ACID, ED
Lactic Acid, Venous: 2.9 mmol/L (ref 0.5–1.9)
Lactic Acid, Venous: 0.9 mmol/L (ref 0.5–1.9)

## 2024-02-04 LAB — PROTIME-INR
INR: 1 (ref 0.8–1.2)
Prothrombin Time: 13.1 s (ref 11.4–15.2)

## 2024-02-04 LAB — LIPASE, BLOOD: Lipase: 99 U/L — ABNORMAL HIGH (ref 11–51)

## 2024-02-04 MED ORDER — PANCRELIPASE (LIP-PROT-AMYL) 36000-114000 UNITS PO CPEP
36000.0000 [IU] | ORAL_CAPSULE | Freq: Once | ORAL | Status: AC
  Administered 2024-02-04: 36000 [IU] via ORAL
  Filled 2024-02-04: qty 1

## 2024-02-04 MED ORDER — PANCRELIPASE (LIP-PROT-AMYL) 36000-114000 UNITS PO CPEP
36000.0000 [IU] | ORAL_CAPSULE | Freq: Four times a day (QID) | ORAL | Status: DC | PRN
  Administered 2024-02-04 – 2024-02-05 (×2): 36000 [IU] via ORAL
  Filled 2024-02-04 (×2): qty 1

## 2024-02-04 MED ORDER — PANCRELIPASE (LIP-PROT-AMYL) 36000-114000 UNITS PO CPEP: 36000.0000 [IU] | ORAL_CAPSULE | Freq: Three times a day (TID) | ORAL | Status: DC

## 2024-02-04 MED ORDER — ACETAMINOPHEN 650 MG RE SUPP: 650.0000 mg | Freq: Four times a day (QID) | RECTAL | Status: DC | PRN

## 2024-02-04 MED ORDER — PANCRELIPASE (LIP-PROT-AMYL) 36000-114000 UNITS PO CPEP
36000.0000 [IU] | ORAL_CAPSULE | Freq: Three times a day (TID) | ORAL | Status: DC
  Filled 2024-02-04: qty 1

## 2024-02-04 MED ORDER — ALBUTEROL SULFATE (2.5 MG/3ML) 0.083% IN NEBU: 2.5000 mg | INHALATION_SOLUTION | RESPIRATORY_TRACT | Status: DC | PRN

## 2024-02-04 MED ORDER — SODIUM CHLORIDE 0.9 % IV SOLN
1.0000 g | INTRAVENOUS | Status: DC
  Administered 2024-02-05 – 2024-02-07 (×3): 1 g via INTRAVENOUS
  Filled 2024-02-04 (×5): qty 10

## 2024-02-04 MED ORDER — PANTOPRAZOLE SODIUM 40 MG PO TBEC
40.0000 mg | DELAYED_RELEASE_TABLET | Freq: Every day | ORAL | Status: DC | PRN
  Administered 2024-02-04 – 2024-02-06 (×3): 40 mg via ORAL
  Filled 2024-02-04 (×2): qty 1

## 2024-02-04 MED ORDER — HEPARIN SODIUM (PORCINE) 5000 UNIT/ML IJ SOLN
5000.0000 [IU] | Freq: Three times a day (TID) | INTRAMUSCULAR | Status: DC
  Administered 2024-02-04 – 2024-02-09 (×10): 5000 [IU] via SUBCUTANEOUS
  Filled 2024-02-04 (×13): qty 1

## 2024-02-04 MED ORDER — VANCOMYCIN HCL 1500 MG/300ML IV SOLN
1500.0000 mg | Freq: Once | INTRAVENOUS | Status: AC
  Administered 2024-02-04: 1500 mg via INTRAVENOUS
  Filled 2024-02-04: qty 300

## 2024-02-04 MED ORDER — CALCITRIOL 0.25 MCG PO CAPS
1.2500 ug | ORAL_CAPSULE | ORAL | Status: DC
  Administered 2024-02-06 – 2024-02-09 (×2): 1.25 ug via ORAL
  Filled 2024-02-04 (×3): qty 5

## 2024-02-04 MED ORDER — CALCIUM ACETATE (PHOS BINDER) 667 MG PO CAPS
667.0000 mg | ORAL_CAPSULE | Freq: Once | ORAL | Status: AC
  Administered 2024-02-04: 667 mg via ORAL
  Filled 2024-02-04: qty 1

## 2024-02-04 MED ORDER — ACETAMINOPHEN 500 MG PO TABS
1000.0000 mg | ORAL_TABLET | Freq: Once | ORAL | Status: AC
  Administered 2024-02-04: 1000 mg via ORAL
  Filled 2024-02-04: qty 2

## 2024-02-04 MED ORDER — PANCRELIPASE (LIP-PROT-AMYL) 36000-114000 UNITS PO CPEP
72000.0000 [IU] | ORAL_CAPSULE | Freq: Three times a day (TID) | ORAL | Status: DC
  Administered 2024-02-05 – 2024-02-06 (×5): 72000 [IU] via ORAL
  Administered 2024-02-06: 36000 [IU] via ORAL
  Administered 2024-02-07 – 2024-02-10 (×10): 72000 [IU] via ORAL
  Filled 2024-02-04 (×16): qty 2

## 2024-02-04 MED ORDER — IOHEXOL 300 MG/ML  SOLN: 100.0000 mL | Freq: Once | INTRAMUSCULAR | Status: DC | PRN

## 2024-02-04 MED ORDER — SEVELAMER CARBONATE 800 MG PO TABS
1600.0000 mg | ORAL_TABLET | Freq: Two times a day (BID) | ORAL | Status: DC
  Administered 2024-02-05 – 2024-02-10 (×11): 1600 mg via ORAL
  Filled 2024-02-04 (×12): qty 2

## 2024-02-04 MED ORDER — CINACALCET HCL 30 MG PO TABS
30.0000 mg | ORAL_TABLET | ORAL | Status: DC
  Administered 2024-02-06 – 2024-02-09 (×2): 30 mg via ORAL
  Filled 2024-02-04 (×3): qty 1

## 2024-02-04 MED ORDER — SODIUM CHLORIDE 0.9 % IV SOLN
1.0000 g | Freq: Once | INTRAVENOUS | Status: AC
  Administered 2024-02-04: 1 g via INTRAVENOUS
  Filled 2024-02-04: qty 10

## 2024-02-04 MED ORDER — VANCOMYCIN HCL 1750 MG/350ML IV SOLN
1750.0000 mg | Freq: Once | INTRAVENOUS | Status: DC
  Filled 2024-02-04: qty 350

## 2024-02-04 MED ORDER — ACETAMINOPHEN 325 MG PO TABS
650.0000 mg | ORAL_TABLET | Freq: Four times a day (QID) | ORAL | Status: DC | PRN
  Administered 2024-02-04: 650 mg via ORAL
  Filled 2024-02-04: qty 2

## 2024-02-04 MED ORDER — VANCOMYCIN HCL IN DEXTROSE 1-5 GM/200ML-% IV SOLN: 1000.0000 mg | INTRAVENOUS | Status: DC

## 2024-02-04 NOTE — ED Provider Notes (Signed)
 [facility]    CSN: [id number] Arrival date & time: 02/04/24  4098      History   Chief Complaint No chief complaint on file.   HPI Manuel Ortega is a 73 y.o. male.   HPI Here for shaking chills that started last night. Feels weak and short of breath some. No cough/congestion. No syncope  NKDA  PMH: ESRD on dialysis, he went this AM.  Recently admitted for sepsis, ?pyelo Past Medical History:  Diagnosis Date   AKI (acute kidney injury) (HCC) 05/2020   possibly due to prostate blockage   Anxiety    Arthritis    Depression    Dyspnea    Glaucoma    Hypertension    Prostate cancer [facility])    Renal disorder    Dialysis T/th/sat    Patient Active Problem List   Diagnosis Date Noted   Pancreatic cyst 01/17/2024   Lung nodules 01/17/2024   Sepsis (HCC) 01/16/2024   ESRD on dialysis (HCC) 01/16/2024   Mucosal abnormality of stomach 10/15/2023   Gastric polyp 10/15/2023   Duodenitis 10/15/2023   Duodenal ulcer 10/15/2023   Acute blood loss anemia 10/13/2023   Melanotic stools 10/13/2023   SOB (shortness of breath) 10/11/2023   Pulmonary nodule 10/09/2023   Pre-syncope 10/09/2023   Dyspnea on exertion 10/09/2023   Anxiety 10/09/2023   Pancreatic cyst 10/09/2023   Prostate cancer (HCC) 11/23/2020   ESRD on dialysis (HCC) 10/31/2020   Bladder outlet obstruction 09/13/2020   Secondary hyperparathyroidism of renal origin (HCC) 07/30/2020   COVID-19 07/16/2020   Iron  deficiency anemia, unspecified 06/26/2020   UTI (urinary tract infection) 06/12/2020   Essential hypertension 06/11/2020   Acute kidney injury (AKI) with acute tubular necrosis (ATN) (HCC) 06/11/2020   Gross hematuria 06/11/2020   Major depressive disorder 06/11/2020   D-dimer, elevated 06/11/2020   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   Prolonged QT interval 06/11/2020   Urinary tract infection associated with catheterization of urinary tract, initial encounter  (HCC) 06/10/2020   Moderate protein-calorie malnutrition (HCC) 06/05/2020   Allergy, unspecified, sequela 06/02/2020   Anemia in chronic kidney disease 06/02/2020   Coagulation defect, unspecified (HCC) 06/02/2020   Encounter for immunization 06/02/2020   Pain, unspecified 06/02/2020   Personal history of anaphylaxis 06/02/2020   Acute kidney injury (HCC) 05/11/2020   Hypokalemia 05/11/2020   Hypocalcemia 05/11/2020   Hypomagnesemia 05/11/2020   Nausea and vomiting 05/11/2020   Primary open angle glaucoma (POAG) of both eyes, severe stage 08/13/2017    Past Surgical History:  Procedure Laterality Date   BIOPSY  10/15/2023   Procedure: BIOPSY;  Surgeon: Ace Holder, MD;  Location: [facility] ENDOSCOPY;  Service: Gastroenterology;;   DIALYSIS/PERMA CATHETER INSERTION N/A 09/23/2023   Procedure: DIALYSIS/PERMA CATHETER INSERTION;  Surgeon: Patrick Boor, MD;  Location: [facility] INVASIVE CV LAB;  Service: Cardiovascular;  Laterality: N/A;   DIALYSIS/PERMA CATHETER REMOVAL N/A 09/23/2023   Procedure: DIALYSIS/PERMA CATHETER REMOVAL;  Surgeon: Patrick Boor, MD;  Location: [facility] INVASIVE CV LAB;  Service: Cardiovascular;  Laterality: N/A;   ESOPHAGOGASTRODUODENOSCOPY (EGD) WITH PROPOFOL  N/A 10/15/2023   Procedure: ESOPHAGOGASTRODUODENOSCOPY (EGD) WITH PROPOFOL ;  Surgeon: Ace Holder, MD;  Location: [facility] ENDOSCOPY;  Service: Gastroenterology;  Laterality: N/A;   growth in nostril     biopsied negatiove   IR FLUORO GUIDE CV LINE RIGHT  05/12/2020   IR FLUORO GUIDE CV LINE RIGHT  05/28/2020   IR US  GUIDE VASC ACCESS RIGHT  05/12/2020  IR US  GUIDE VASC ACCESS RIGHT  05/28/2020   TRANSURETHRAL RESECTION OF PROSTATE N/A 09/13/2020   Procedure: TRANSURETHRAL RESECTION OF THE PROSTATE (TURP) WITH CYSTOSCOPY;  Surgeon: Florencio Hunting, MD;  Location: [facility] ORS;  Service: Urology;  Laterality: N/A;       Home Medications    Prior to Admission medications   Medication Sig Start Date End Date Taking?  Authorizing Provider  azelastine (ASTELIN) 0.1 % nasal spray Place 1 spray into both nostrils 2 (two) times daily as needed for rhinitis or allergies. 09/17/22 10/10/23  [provider]  calcium  acetate (PHOSLO ) 667 MG capsule Take 667-1,334 mg by mouth See admin instructions. Take 1,334 mg by mouth once a day and 667 mg in the evening    [provider]  calcium  carbonate (TUMS) 500 MG chewable tablet Chew 1 tablet by mouth 3 (three) times daily as needed for indigestion or heartburn. 03/18/22   [provider]  CREON  36000-114000 units CPEP capsule Take 36,000-72,000 Units by mouth See admin instructions. Take 72,000 units by mouth three time a day with meals and 36,000 units three times a day with snacks    [provider]  CREON  36000-114000 units CPEP capsule Take 2 capsules by mouth 3 (three) times daily before meals. Take one capsule for snack in addition to TID 01/08/24   [provider]  diphenhydrAMINE  (BENADRYL ) 25 mg capsule Take 25 mg by mouth every 4 (four) hours as needed for itching or allergies.    [provider]  ferrous sulfate  325 (65 FE) MG EC tablet Take 325 mg by mouth daily with breakfast.    [provider]  ipratropium (ATROVENT) 0.03 % nasal spray Place 2 sprays into both nostrils every 12 (twelve) hours as needed for rhinitis.    [provider]  levofloxacin  (LEVAQUIN ) 750 MG tablet Take 1 tablet (750 mg total) by mouth every other day. After dialysis 01/18/24   Ephriam Hashimoto, MD  LUMIGAN  0.01 % SOLN Place 1 drop into both eyes at bedtime.    [provider]  LUMIGAN  0.01 % SOLN Place 1 drop into both eyes at bedtime. 12/21/23   [provider]  mirtazapine  (REMERON ) 15 MG tablet Take 15 mg by mouth at bedtime.    [provider]  mirtazapine  (REMERON ) 15 MG tablet Take 15 mg by mouth daily. 11/22/23   [provider]  montelukast  (SINGULAIR ) 10 MG tablet Take 10 mg  by mouth at bedtime.    [provider]  montelukast  (SINGULAIR ) 10 MG tablet Take 10 mg by mouth daily. 12/18/23   [provider]  ondansetron  (ZOFRAN -ODT) 8 MG disintegrating tablet Take 8 mg by mouth every 8 (eight) hours as needed for nausea or vomiting (dissolve orally).    [provider]  pantoprazole  (PROTONIX ) 40 MG tablet Take 1 tablet (40 mg total) by mouth 2 (two) times daily. 10/15/23   Macdonald Savoy, MD  pantoprazole  (PROTONIX ) 40 MG tablet Take 40 mg by mouth 2 (two) times daily. 11/26/23   [provider]  QUEtiapine  (SEROQUEL ) 100 MG tablet Take 200 mg by mouth at bedtime.    [provider]  QUEtiapine  Fumarate (SEROQUEL  XR) 150 MG 24 hr tablet Take 150 mg by mouth at bedtime. 11/27/23   [provider]  sertraline  (ZOLOFT ) 100 MG tablet Take 200 mg by mouth in the morning.    [provider]  sertraline  (ZOLOFT ) 100 MG tablet Take 200 mg by mouth daily.  11/27/23   [provider]  sevelamer  carbonate (RENVELA ) 800 MG tablet Take 1,600 mg by mouth in the morning and at bedtime. 12/29/23   [provider]  sildenafil (VIAGRA) 100 MG tablet Take 100 mg by mouth daily as needed for erectile dysfunction. 10/29/22   [provider]  sildenafil (VIAGRA) 100 MG tablet Take 100 mg by mouth as needed for erectile dysfunction. 12/17/23   [provider]  timolol  (BETIMOL ) 0.25 % ophthalmic solution Place 1 drop into both eyes in the morning.    [provider]  timolol  (TIMOPTIC ) 0.5 % ophthalmic solution Place 1 drop into both eyes every morning. 12/18/23   [provider]  TYLENOL  500 MG tablet Take 500-1,000 mg by mouth every 6 (six) hours as needed for mild pain (pain score 1-3) or headache.    [provider]  zolpidem  (AMBIEN ) 10 MG tablet Take 10 mg by mouth at bedtime as needed for sleep.    [provider]  zolpidem  (AMBIEN ) 10 MG tablet Take 10 mg by  mouth daily as needed for sleep.    [provider]    Family History Family History  Problem Relation Age of Onset   Stroke Mother    Breast cancer Neg Hx    Colon cancer Neg Hx    Prostate cancer Neg Hx    Pancreatic cancer Neg Hx     Social History Social History   Tobacco Use   Smoking status: Never    Passive exposure: Never   Smokeless tobacco: Never  Vaping Use   Vaping status: Never Used  Substance Use Topics   Alcohol use: Not Currently    Alcohol/week: 1.0 standard drink of alcohol    Types: 1 Glasses of wine per week    Comment: rare   Drug use: Not Currently     Allergies   Patient has no known allergies.   Review of Systems Review of Systems   Physical Exam Triage Vital Signs ED Triage Vitals  Encounter Vitals Group     BP 02/04/24 1003 (!) 164/86     Systolic BP Percentile --      Diastolic BP Percentile --      Pulse Rate 02/04/24 1003 (!) 110     Resp 02/04/24 1003 20     Temp 02/04/24 1003 100.1 F (37.8 C)     Temp Source 02/04/24 1003 Oral     SpO2 02/04/24 1003 97 %     Weight --      Height --      Head Circumference --      Peak Flow --      Pain Score 02/04/24 1005 0     Pain Loc --      Pain Education --      Exclude from Growth Chart --    No data found.  Updated Vital Signs BP (!) 164/86   Pulse (!) 110   Temp 100.1 F (37.8 C) (Oral)   Resp 20   SpO2 97%   Visual Acuity Right Eye Distance:   Left Eye Distance:   Bilateral Distance:    Right Eye Near:   Left Eye Near:    Bilateral Near:     Physical Exam Vitals reviewed.  Constitutional:      Appearance: He is not diaphoretic.     Comments: Sitting on the exam table, shaking with chills. Alert.   HENT:     Mouth/Throat:  Mouth: Mucous membranes are moist.  Eyes:     Extraocular Movements: Extraocular movements intact.     Conjunctiva/sclera: Conjunctivae normal.     Pupils: Pupils are equal, round, and reactive to light.  Cardiovascular:      Rate and Rhythm: Regular rhythm. Tachycardia present.     Heart sounds: No murmur heard. Pulmonary:     Effort: Pulmonary effort is normal.     Breath sounds: Normal breath sounds.  Musculoskeletal:     Cervical back: Neck supple.  Lymphadenopathy:     Cervical: No cervical adenopathy.  Skin:    Coloration: Skin is not jaundiced or pale.  Neurological:     General: No focal deficit present.     Mental Status: He is alert and oriented to person, place, and time.  Psychiatric:        Behavior: Behavior normal.      UC Treatments / Results  Labs (all labs ordered are listed, but only abnormal results are displayed) Labs Reviewed - No data to display  EKG   Radiology No results found.  Procedures Procedures (including critical care time)  Medications Ordered in UC Medications - No data to display  Initial Impression / Assessment and Plan / UC Course  I have reviewed the triage vital signs and the nursing notes.  Pertinent labs & imaging results that were available during my care of the patient were reviewed by me and considered in my medical decision making (see chart for details).      He is tachycardic, and I am concerned he has sepsis again.  Will go to the emergency room for higher level of evaluation and treatment that we can provide here in urgent care.  He will go by private car.   Final Clinical Impressions(s) / UC Diagnoses   Final diagnoses:  Chills  Sepsis, due to unspecified organism, unspecified whether acute organ dysfunction present [facility])     Discharge Instructions      He will go to the ER for further evaluation and treatment     ED Prescriptions   None    PDMP not reviewed this encounter.   Ann Keto, MD 02/04/24 1020

## 2024-02-04 NOTE — ED Triage Notes (Signed)
 Pt arrived via POV. C/o chills that began last night, had dialysis appt today. Temp of 100.1 in triage

## 2024-02-04 NOTE — Progress Notes (Addendum)
 ED Pharmacy Antibiotic Sign Off An antibiotic consult was received from an ED provider for Cefepime , Vancomycin  per pharmacy dosing for sepsis. A chart review was completed to assess appropriateness.   SCr 6.8 last on 4/7.  Hx ESRD on HD TThS.  Had HD this morning, labs pending.  Recent admission 4/5-4/7 with pyelonephritis.  Blood and urine cultures no growth at 48 hours.  Discharged to complete 7 days with Levaquin .     The following one time order(s) were placed:  Cefepime  1g IV x1 Vancomycin  1500mg  IV x1  Further antibiotic and/or antibiotic pharmacy consults should be ordered by the admitting provider if indicated.   Thank you for allowing pharmacy to be a part of this patient's care.   Kendall Pauls PharmD, BCPS [facility] [phone] 02/04/2024 11:44 AM

## 2024-02-04 NOTE — Discharge Instructions (Addendum)
 He will go to the ER for further evaluation and treatment

## 2024-02-04 NOTE — ED Notes (Signed)
 Attempted to call give report x1. Edith,RN is unavailable at the moment

## 2024-02-04 NOTE — Progress Notes (Signed)
 Pharmacy Antibiotic Note  Manuel Ortega is a 73 y.o. male admitted on 02/04/2024 with sepsis.  Pharmacy has been consulted for Cefepime  and vancomycin  dosing.  Hx of recent recent admission 4/5-4/7 with pyelonephritis.  Blood and urine cultures no growth.  Discharged to complete 7 days with Levaquin .    SCr 5.66 with hx of ESRD.  His HD schedule is TThSat, and he completed 3 out of 4 hours of dialysis on Thursday, 4/24.    Plan: Cefepime  1g IV q24h Vancomycin  1500mg  loading dose given 4/24 at ~13:30.   Further vancomycin  doses after dialysis sessions.  Vancomycin  1000mg  IV on Tues, Thurs, Sat post-HD.  Next planned on Saturday 4/26 Measure Vanc levels as needed.  Goal AUC = 400 - 550 Follow up renal function, culture results, and clinical course.   Height: 6\' 1"  (185.4 cm) Weight: 88.5 kg (195 lb) IBW/kg (Calculated) : 79.9  Temp (24hrs), Avg:100.1 F (37.8 C), Min:100.1 F (37.8 C), Max:100.1 F (37.8 C)  Recent Labs  Lab 02/04/24 1120 02/04/24 1127  WBC 10.8*  --   CREATININE 5.66*  --   LATICACIDVEN  --  2.9*    Estimated Creatinine Clearance: 13.3 mL/min (A) (by C-G formula based on SCr of 5.66 mg/dL (H)).    No Known Allergies  Antimicrobials this admission: 4/24 Vancomycin  >> 4/24 Cefepime  >>    Dose adjustments this admission:   Microbiology results: 4/24 Resp panel: neg covid, flu, rsv 4/24 BCx: ip   Thank you for allowing pharmacy to be a part of this patient's care.  Kendall Pauls PharmD, BCPS [facility] [phone] 02/04/2024 2:44 PM

## 2024-02-04 NOTE — ED Notes (Addendum)
 [employer] transport setup for Pt.

## 2024-02-04 NOTE — Consult Note (Signed)
 [facility] Renal Consultation Note  Requesting MD: Pincus Bridgeman, MD Indication for Consultation:  ESRD  Chief complaint: chills   HPI:  Manuel Ortega is a 73 y.o. male with a history of ESRD on HD TTS, HTN, pancreatic insufficiency, and prostate cancer s/p TURP who presented to the ER after feeling poorly after HD.  He had fever and chills.  He is always cold at HD but then noticed that he was cold outside as well.  He states that he presented to urgent care and they directed him to the ER - he states this was due to his "vitals".  No hypotension here per RN.  Temp of 100.1 here and he was tachycardic.  RSV, flu, and covid were all negative.  He has had diarrhea but this is improved with creon .  His last HD was on 4/24 and he had a post weight of 92.4 kg after removing 0.2 kg - this weight is an outlier for him. Several recent post-weights are above 96 kg.  Note that he didn't have HD on 4/22 as he stated that he was to have a procedure in [place] (he states this was an EUS for pancreas nodule).  Per outpatient HD charting, he has declined permanent access.  We discussed the concern about his catheter and the risk of bacteremia again today - he continues to refuse permanent access "I'll never get one of those things in my arm".  The last rounding note is from 4/24 and indicates that he is having financial concerns and stated that he may only come once per week due to the same.  He shares that he makes urine and asks if there is any medication that he can take to help his kidneys.  We discussed that ESRD is treated with dialysis and that this would be the only treatment; any additional recommendations would be best from his regular rounding nephrologist.  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    PMHx:   Past Medical History:  Diagnosis Date   AKI (acute kidney injury) (HCC) 05/2020   possibly due to prostate blockage   Anxiety    Arthritis     Depression    Dyspnea    Glaucoma    Hypertension    Prostate cancer [facility])    Renal disorder    Dialysis T/th/sat    Past Surgical History:  Procedure Laterality Date   BIOPSY  10/15/2023   Procedure: BIOPSY;  Surgeon: Ace Holder, MD;  Location: [facility] ENDOSCOPY;  Service: Gastroenterology;;   DIALYSIS/PERMA CATHETER INSERTION N/A 09/23/2023   Procedure: DIALYSIS/PERMA CATHETER INSERTION;  Surgeon: Patrick Boor, MD;  Location: [facility] INVASIVE CV LAB;  Service: Cardiovascular;  Laterality: N/A;   DIALYSIS/PERMA CATHETER REMOVAL N/A 09/23/2023   Procedure: DIALYSIS/PERMA CATHETER REMOVAL;  Surgeon: Patrick Boor, MD;  Location: [facility] INVASIVE CV LAB;  Service: Cardiovascular;  Laterality: N/A;   ESOPHAGOGASTRODUODENOSCOPY (EGD) WITH PROPOFOL  N/A 10/15/2023   Procedure: ESOPHAGOGASTRODUODENOSCOPY (EGD) WITH PROPOFOL ;  Surgeon: Ace Holder, MD;  Location: [facility] ENDOSCOPY;  Service: Gastroenterology;  Laterality: N/A;   growth in nostril     biopsied negatiove   IR FLUORO GUIDE CV LINE RIGHT  05/12/2020   IR FLUORO GUIDE CV LINE RIGHT  05/28/2020   IR US  GUIDE VASC ACCESS RIGHT  05/12/2020   IR US  GUIDE VASC ACCESS RIGHT  05/28/2020   TRANSURETHRAL RESECTION OF PROSTATE N/A 09/13/2020   Procedure: TRANSURETHRAL RESECTION OF THE PROSTATE (  TURP) WITH CYSTOSCOPY;  Surgeon: Florencio Hunting, MD;  Location: [facility] ORS;  Service: Urology;  Laterality: N/A;    Family Hx:  Family History  Problem Relation Age of Onset   Stroke Mother    Breast cancer Neg Hx    Colon cancer Neg Hx    Prostate cancer Neg Hx    Pancreatic cancer Neg Hx     Social History:  reports that he has never smoked. He has never been exposed to tobacco smoke. He has never used smokeless tobacco. He reports that he does not currently use alcohol after a past usage of about 1.0 standard drink of alcohol per week. He reports that he does not currently use drugs.  Allergies: No Known Allergies  Medications: Prior to Admission  medications   Medication Sig Start Date End Date Taking? Authorizing Provider  azelastine (ASTELIN) 0.1 % nasal spray Place 1 spray into both nostrils 2 (two) times daily as needed for rhinitis or allergies. 09/17/22 10/10/23  [provider]  calcium  acetate (PHOSLO ) 667 MG capsule Take 667-1,334 mg by mouth See admin instructions. Take 1,334 mg by mouth once a day and 667 mg in the evening    [provider]  calcium  carbonate (TUMS) 500 MG chewable tablet Chew 1 tablet by mouth 3 (three) times daily as needed for indigestion or heartburn. 03/18/22   [provider]  CREON  36000-114000 units CPEP capsule Take 36,000-72,000 Units by mouth See admin instructions. Take 72,000 units by mouth three time a day with meals and 36,000 units three times a day with snacks    [provider]  CREON  36000-114000 units CPEP capsule Take 2 capsules by mouth 3 (three) times daily before meals. Take one capsule for snack in addition to TID 01/08/24   [provider]  diphenhydrAMINE  (BENADRYL ) 25 mg capsule Take 25 mg by mouth every 4 (four) hours as needed for itching or allergies.    [provider]  ferrous sulfate  325 (65 FE) MG EC tablet Take 325 mg by mouth daily with breakfast.    [provider]  ipratropium (ATROVENT) 0.03 % nasal spray Place 2 sprays into both nostrils every 12 (twelve) hours as needed for rhinitis.    [provider]  levofloxacin  (LEVAQUIN ) 750 MG tablet Take 1 tablet (750 mg total) by mouth every other day. After dialysis 01/18/24   Ephriam Hashimoto, MD  LUMIGAN  0.01 % SOLN Place 1 drop into both eyes at bedtime.    [provider]  LUMIGAN  0.01 % SOLN Place 1 drop into both eyes at bedtime. 12/21/23   [provider]  mirtazapine  (REMERON ) 15 MG tablet Take 15 mg by mouth at bedtime.    [provider]  mirtazapine  (REMERON ) 15 MG tablet Take 15 mg by mouth daily. 11/22/23   [provider]  montelukast  (SINGULAIR ) 10 MG tablet Take 10 mg by mouth at bedtime.    [provider]  montelukast  (SINGULAIR ) 10 MG tablet Take 10 mg by mouth daily. 12/18/23   [provider]  ondansetron  (ZOFRAN -ODT) 8 MG disintegrating tablet Take 8 mg by mouth every 8 (eight) hours as needed for nausea or vomiting (dissolve orally).    [provider]  pantoprazole  (PROTONIX ) 40 MG tablet Take 1 tablet (40 mg total) by mouth 2 (two) times daily. 10/15/23   Macdonald Savoy, MD  pantoprazole  (PROTONIX ) 40 MG tablet Take 40 mg by mouth 2 (two) times daily. 11/26/23   [provider]  QUEtiapine  (SEROQUEL ) 100 MG tablet Take 200 mg by mouth at bedtime.    [provider]  QUEtiapine  Fumarate (SEROQUEL  XR) 150 MG 24 hr tablet Take 150 mg by mouth at bedtime. 11/27/23   [provider]  sertraline  (ZOLOFT ) 100 MG tablet Take 200 mg by mouth in the morning.    [provider]  sertraline  (ZOLOFT ) 100 MG tablet Take 200 mg by mouth daily. 11/27/23   [provider]  sevelamer  carbonate (RENVELA ) 800 MG tablet Take 1,600 mg by mouth in the morning and at bedtime. 12/29/23   [provider]  sildenafil (VIAGRA) 100 MG tablet Take 100 mg by mouth daily as needed for erectile dysfunction. 10/29/22   [provider]  sildenafil (VIAGRA) 100 MG tablet Take 100 mg by mouth as needed for erectile dysfunction. 12/17/23   [provider]  timolol  (BETIMOL ) 0.25 % ophthalmic solution Place 1 drop into both eyes in the morning.    [provider]  timolol  (TIMOPTIC ) 0.5 % ophthalmic solution Place 1 drop into both eyes every morning. 12/18/23   [provider]  TYLENOL  500 MG tablet Take 500-1,000 mg by mouth every 6 (six) hours as needed for mild pain (pain score 1-3) or headache.    [provider]  zolpidem  (AMBIEN ) 10 MG tablet Take 10 mg by mouth at bedtime as needed for sleep.    [provider]  zolpidem  (AMBIEN ) 10 MG tablet Take 10 mg by mouth daily as needed for sleep.    [provider]   I have reviewed the patient's current and reported prior admission medications.  Labs:     Latest Ref Rng & Units 02/04/2024   11:20 AM 01/18/2024    5:00 AM 01/17/2024    4:37 AM  BMP  Glucose 70 - 99 mg/dL 409  96  811   BUN 8 - 23 mg/dL 50  27  19   Creatinine 0.61 - 1.24 mg/dL 9.14  7.82  9.56   Sodium 135 - 145 mmol/L 136  138  138   Potassium 3.5 - 5.1 mmol/L 3.3  3.6  3.1   Chloride 98 - 111 mmol/L 101  104  101   CO2 22 - 32 mmol/L 22  21  25    Calcium  8.9 - 10.3 mg/dL 9.4  9.0  8.6     Urinalysis    Component Value Date/Time   COLORURINE YELLOW 01/16/2024 1724   APPEARANCEUR HAZY (A) 01/16/2024 1724   LABSPEC 1.006 01/16/2024 1724   PHURINE 9.0 (H) 01/16/2024 1724   GLUCOSEU NEGATIVE 01/16/2024 1724   HGBUR NEGATIVE 01/16/2024 1724   BILIRUBINUR NEGATIVE 01/16/2024 1724   KETONESUR NEGATIVE 01/16/2024 1724   PROTEINUR 100 (A) 01/16/2024 1724   NITRITE NEGATIVE 01/16/2024 1724   LEUKOCYTESUR LARGE (A) 01/16/2024 1724     ROS:  Pertinent items noted in HPI and remainder of comprehensive ROS otherwise negative.  Physical Exam: Vitals:   02/04/24 1047 02/04/24 1530  BP: (!) 142/71   Pulse: (!) 117   Resp: 20   Temp: 100.1 F (37.8 C) 99.2 F (37.3 C)  SpO2: 100%      General:  adult male in stretcher in NAD   HEENT: NCAT Eyes: EOMI sclera anicteric Neck: supple trachea midline  Heart: S1S2 tachycardic; no rub Lungs: clear and normal work of breathing at rest; on room air  Abdomen: soft/nt/nd Extremities: no edema appreciated; no cyanosis or clubbing  Skin:  no rash on extremities exposed Neuro: alert and oriented x 3 provides hx and follows commands  Psych: no anxiety or agitation  Extremities - RIJ tunneled dialysis catheter   Outpatient HD orders:  [facility]  TTS  4 hours  BF 400  DF auto 1.5  EDW 94.2 kg  2K /2.5  Ca Tunneled catheter Meds: mircera 75 mcg every 4 weeks (last dose on 01/12/24), venofer  50 mg weekly, sensipar  30 mg three times a week, and calcitriol  1.25 mcg three times a week    Assessment/Plan:  # ESRD  - on HD TTS.  He had most of an HD session the date of presentation.  Next HD treatment would be 4/26  - Agree with planned transfer to [facility] given that the patient is ESRD   # Sepsis  - antibiotics per primary team  - blood cultures were sent  - given that he has a catheter, he is at risk of bacteremia.  He has refused permanent access and continues to do so after our discussion today.  I thanked him for letting me talk with him about that    # HTN   -Acceptable control on current regimen   # Anemia of CKD  - Hb acceptable   # Metabolic bone disease  - resume sensipar  and calcitriol    - check phos in AM  - on renvela  1600 mg BID per most recent discharge summary  Disposition - he has been admitted by the hospitalist team.  Agree with planned transfer to [facility] given that the patient is ESRD    Nan Aver 02/04/2024, 6:38 PM

## 2024-02-04 NOTE — ED Triage Notes (Addendum)
 Pt c/o runny nose and shivering, pt states feels cold. Pt states he just came from dialysis. Pt states it started last night. Pt is shivering uncontrollably during triage.

## 2024-02-04 NOTE — ED Notes (Signed)
 Patient is being discharged from the Urgent Care and sent to the Emergency Department via POV . Per Lauralyn Pollack MD, patient is in need of higher level of care due to tachycardia and sepsis. Patient is aware and verbalizes understanding of plan of care.  Vitals:   02/04/24 1003  BP: (!) 164/86  Pulse: (!) 110  Resp: 20  Temp: 100.1 F (37.8 C)  SpO2: 97%

## 2024-02-04 NOTE — ED Provider Notes (Signed)
 [place] EMERGENCY DEPARTMENT AT [facility] Provider Note   CSN: [id number] Arrival date & time: 02/04/24  1043     History  Chief Complaint  Patient presents with   Chills   Fever    Manuel Ortega is a 73 y.o. male.  73 year old male with prior medical history as detailed below presents for evaluation.  Patient reports onset of chills and myalgias yesterday.  He presented to dialysis today for routine dialysis.  He completed 3 of his 4-hour session.  He felt chills and rigor during the dialysis sessions so he asked for it to be altered.  He then presented at urgent care with complaint of myalgias, chills, subjective fever.  Urgent care recorded temperature of 100.1 with a heart rate in the 1 teens.  Urgent care recommended evaluation here in the ED.  In the ED the patient has a temperature of 100.1 and mild tachycardia.  Patient complains of myalgias and chills.  His dialysis access is through a line in his right anterior chest wall.  He has been on dialysis for 3 years.  He has declined AV fistula creation.  His dialysis line was changed approximately 3 to 4 months ago per his report.  The history is provided by the patient.       Home Medications Prior to Admission medications   Medication Sig Start Date End Date Taking? Authorizing Provider  azelastine (ASTELIN) 0.1 % nasal spray Place 1 spray into both nostrils 2 (two) times daily as needed for rhinitis or allergies. 09/17/22 10/10/23  [provider]  calcium  acetate (PHOSLO ) 667 MG capsule Take 667-1,334 mg by mouth See admin instructions. Take 1,334 mg by mouth once a day and 667 mg in the evening    [provider]  calcium  carbonate (TUMS) 500 MG chewable tablet Chew 1 tablet by mouth 3 (three) times daily as needed for indigestion or heartburn. 03/18/22   [provider]  CREON  36000-114000 units CPEP capsule Take 36,000-72,000 Units by mouth See admin instructions. Take 72,000  units by mouth three time a day with meals and 36,000 units three times a day with snacks    [provider]  CREON  36000-114000 units CPEP capsule Take 2 capsules by mouth 3 (three) times daily before meals. Take one capsule for snack in addition to TID 01/08/24   [provider]  diphenhydrAMINE  (BENADRYL ) 25 mg capsule Take 25 mg by mouth every 4 (four) hours as needed for itching or allergies.    [provider]  ferrous sulfate  325 (65 FE) MG EC tablet Take 325 mg by mouth daily with breakfast.    [provider]  ipratropium (ATROVENT) 0.03 % nasal spray Place 2 sprays into both nostrils every 12 (twelve) hours as needed for rhinitis.    [provider]  levofloxacin  (LEVAQUIN ) 750 MG tablet Take 1 tablet (750 mg total) by mouth every other day. After dialysis 01/18/24   Ephriam Hashimoto, MD  LUMIGAN  0.01 % SOLN Place 1 drop into both eyes at bedtime.    [provider]  LUMIGAN  0.01 % SOLN Place 1 drop into both eyes at bedtime. 12/21/23   [provider]  mirtazapine  (REMERON ) 15 MG tablet Take 15 mg by mouth at bedtime.    [provider]  mirtazapine  (REMERON ) 15 MG tablet Take 15 mg by mouth daily. 11/22/23   [provider]  montelukast  (SINGULAIR ) 10 MG tablet Take 10 mg by mouth at bedtime.  [provider]  montelukast  (SINGULAIR ) 10 MG tablet Take 10 mg by mouth daily. 12/18/23   [provider]  ondansetron  (ZOFRAN -ODT) 8 MG disintegrating tablet Take 8 mg by mouth every 8 (eight) hours as needed for nausea or vomiting (dissolve orally).    [provider]  pantoprazole  (PROTONIX ) 40 MG tablet Take 1 tablet (40 mg total) by mouth 2 (two) times daily. 10/15/23   Macdonald Savoy, MD  pantoprazole  (PROTONIX ) 40 MG tablet Take 40 mg by mouth 2 (two) times daily. 11/26/23   [provider]  QUEtiapine  (SEROQUEL ) 100 MG tablet Take 200 mg by mouth at bedtime.    [provider]  QUEtiapine  Fumarate (SEROQUEL  XR) 150 MG 24 hr tablet Take 150 mg by mouth at bedtime. 11/27/23   [provider]  sertraline  (ZOLOFT ) 100 MG tablet Take 200 mg by mouth in the morning.    [provider]  sertraline  (ZOLOFT ) 100 MG tablet Take 200 mg by mouth daily. 11/27/23   [provider]  sevelamer  carbonate (RENVELA ) 800 MG tablet Take 1,600 mg by mouth in the morning and at bedtime. 12/29/23   [provider]  sildenafil (VIAGRA) 100 MG tablet Take 100 mg by mouth daily as needed for erectile dysfunction. 10/29/22   [provider]  sildenafil (VIAGRA) 100 MG tablet Take 100 mg by mouth as needed for erectile dysfunction. 12/17/23   [provider]  timolol  (BETIMOL ) 0.25 % ophthalmic solution Place 1 drop into both eyes in the morning.    [provider]  timolol  (TIMOPTIC ) 0.5 % ophthalmic solution Place 1 drop into both eyes every morning. 12/18/23   [provider]  TYLENOL  500 MG tablet Take 500-1,000 mg by mouth every 6 (six) hours as needed for mild pain (pain score 1-3) or headache.    [provider]  zolpidem  (AMBIEN ) 10 MG tablet Take 10 mg by mouth at bedtime as needed for sleep.    [provider]  zolpidem  (AMBIEN ) 10 MG tablet Take 10 mg by mouth daily as needed for sleep.    [provider]      Allergies    Patient has no known allergies.    Review of Systems   Review of Systems  All other systems reviewed and are negative.   Physical Exam Updated Vital Signs BP (!) 142/71 (BP Location: Left Arm)   Pulse (!) 117   Temp 100.1 F (37.8 C) (Oral)   Resp 20   Ht 6\' 1"  (1.854 m)   Wt 88.5 kg   SpO2 100%   BMI 25.73 kg/m  Physical Exam Vitals and nursing note reviewed.  Constitutional:      General: He is not in acute distress.    Appearance: He is well-developed.     Comments: Alert, appears uncomfortable, tachycardic  HENT:     Head: Normocephalic  and atraumatic.  Eyes:     Conjunctiva/sclera: Conjunctivae normal.     Pupils: Pupils are equal, round, and reactive to light.  Cardiovascular:     Rate and Rhythm: Normal rate and regular rhythm.     Heart sounds: Normal heart sounds.  Pulmonary:     Effort: Pulmonary effort is normal. No respiratory distress.     Breath sounds: Normal breath sounds.     Comments: Dialysis line located in the right anterior chest wall without surrounding erythema. Abdominal:     General: There is no distension.     Palpations: Abdomen  is soft.     Tenderness: There is no abdominal tenderness.  Musculoskeletal:        General: No deformity. Normal range of motion.     Cervical back: Normal range of motion and neck supple.  Skin:    General: Skin is warm and dry.  Neurological:     General: No focal deficit present.     Mental Status: He is alert and oriented to person, place, and time.     ED Results / Procedures / Treatments   Labs (all labs ordered are listed, but only abnormal results are displayed) Labs Reviewed  CULTURE, BLOOD (ROUTINE X 2)  CULTURE, BLOOD (ROUTINE X 2)  RESP PANEL BY RT-PCR (RSV, FLU A&B, COVID)  [id number]  COMPREHENSIVE METABOLIC PANEL WITH GFR  CBC WITH DIFFERENTIAL/PLATELET  PROTIME-INR  URINALYSIS, W/ REFLEX TO CULTURE (INFECTION SUSPECTED)  LIPASE, BLOOD  I-STAT CG4 LACTIC ACID, ED    EKG None  Radiology No results found.  Procedures Procedures    Medications Ordered in ED Medications  acetaminophen  (TYLENOL ) tablet 1,000 mg (has no administration in time range)    ED Course/ Medical Decision Making/ A&P                                 Medical Decision Making Amount and/or Complexity of Data Reviewed Labs: ordered. Radiology: ordered.  Risk OTC drugs. Prescription drug management. Decision regarding hospitalization.    Medical Screen Complete  This patient presented to the ED with complaint of fever, chills, myalgias.  This  complaint involves an extensive number of treatment options. The initial differential diagnosis includes, but is not limited to, viral versus bacterial infection, bacteremia, sepsis, metabolic abnormality, etc.  This presentation is: Acute, Self-Limited, Previously Undiagnosed, Uncertain Prognosis, Complicated, Systemic Symptoms, and Threat to Life/Bodily Function  Patient with history of ESRD on HD presents with 24 hours of malaise, fatigue, fever.  Patient noted to be mildly tachycardic on initial evaluation.  Patient did have 3 hours of dialysis session early this morning.  Presentation is concerning for sepsis /bacteremia.  Patient's dialysis access is through a right IJ.  This was last changed approximately 4 months ago per his report.  Broad-spectrum antibiotics initiated in the ED.  Screening labs are notable for lactic acid of 2.9.  White count is 10.8.  Potassium is 3.3.  Creatinine is 5.6.  Chest x-ray does not show acute pathology.  Patient refused CT chest abdomen pelvis.  Hospitalist made aware of case.    Co morbidities that complicated the patient's evaluation  See HPI   Additional history obtained:  External records from outside sources obtained and reviewed including prior ED visits and prior Inpatient records.    Problem List / ED Course:  Fever   Disposition:  After consideration of the diagnostic results and the patients response to treatment, I feel that the patent would benefit from admission.          Final Clinical Impression(s) / ED Diagnoses Final diagnoses:  Fever, unspecified fever cause    Rx / DC Orders ED Discharge Orders     None         Burnette Carte, MD 02/04/24 1430

## 2024-02-04 NOTE — H&P (Signed)
 History and Physical  Manuel Ortega ZOX:[id number] DOB: Mar 04, 1951 DOA: 02/04/2024  PCP: Patient, No Pcp Per   Chief Complaint: Chills, rigors  HPI: Manuel Ortega is a 73 y.o. male with medical history significant for glaucoma, hypertension, prostate cancer, ESRD on Tuesday Thursday Saturday dialysis being admitted to the hospital with concern for sepsis of unclear etiology.  He was recently hospitalized at [facility] earlier this month, for sepsis felt to be due to possible pyelonephritis.  He has been doing well, tolerating his dialysis, however yesterday he started feeling generally unwell, weak, vague nausea.  Today he went to dialysis, was feeling very very cold and chilled, says that he normally does feel cold and dialysis center, but when he comes out of there he really feels better.  Today he did not.  He cut his dialysis session short by 1 hour since he was not feeling well.  He denies any cough, fevers, diarrhea, shortness of breath, sick contacts, dysuria.  He says that he does make a very good amount of urine every day.  On further discussion, he does mention that he intermittently has had inner ear pain on the right side, and some associated fullness and discomfort around his right eye, maybe in the sinuses.  Workup as detailed below shows evidence of sepsis, he was started on empiric broad-spectrum IV antibiotics and hospitalist admission was requested.  Review of Systems: Please see HPI for pertinent positives and negatives. A complete 10 system review of systems are otherwise negative.  Past Medical History:  Diagnosis Date   AKI (acute kidney injury) (HCC) 05/2020   possibly due to prostate blockage   Anxiety    Arthritis    Depression    Dyspnea    Glaucoma    Hypertension    Prostate cancer [facility])    Renal disorder    Dialysis T/th/sat   Past Surgical History:  Procedure Laterality Date   BIOPSY  10/15/2023   Procedure: BIOPSY;  Surgeon: Ace Holder,  MD;  Location: [facility] ENDOSCOPY;  Service: Gastroenterology;;   DIALYSIS/PERMA CATHETER INSERTION N/A 09/23/2023   Procedure: DIALYSIS/PERMA CATHETER INSERTION;  Surgeon: Patrick Boor, MD;  Location: [facility] INVASIVE CV LAB;  Service: Cardiovascular;  Laterality: N/A;   DIALYSIS/PERMA CATHETER REMOVAL N/A 09/23/2023   Procedure: DIALYSIS/PERMA CATHETER REMOVAL;  Surgeon: Patrick Boor, MD;  Location: [facility] INVASIVE CV LAB;  Service: Cardiovascular;  Laterality: N/A;   ESOPHAGOGASTRODUODENOSCOPY (EGD) WITH PROPOFOL  N/A 10/15/2023   Procedure: ESOPHAGOGASTRODUODENOSCOPY (EGD) WITH PROPOFOL ;  Surgeon: Ace Holder, MD;  Location: [facility] ENDOSCOPY;  Service: Gastroenterology;  Laterality: N/A;   growth in nostril     biopsied negatiove   IR FLUORO GUIDE CV LINE RIGHT  05/12/2020   IR FLUORO GUIDE CV LINE RIGHT  05/28/2020   IR US  GUIDE VASC ACCESS RIGHT  05/12/2020   IR US  GUIDE VASC ACCESS RIGHT  05/28/2020   TRANSURETHRAL RESECTION OF PROSTATE N/A 09/13/2020   Procedure: TRANSURETHRAL RESECTION OF THE PROSTATE (TURP) WITH CYSTOSCOPY;  Surgeon: Florencio Hunting, MD;  Location: [facility] ORS;  Service: Urology;  Laterality: N/A;   Social History:  reports that he has never smoked. He has never been exposed to tobacco smoke. He has never used smokeless tobacco. He reports that he does not currently use alcohol after a past usage of about 1.0 standard drink of alcohol per week. He reports that he does not currently use drugs.  No Known Allergies  Family History  Problem Relation Age of Onset  Stroke Mother    Breast cancer Neg Hx    Colon cancer Neg Hx    Prostate cancer Neg Hx    Pancreatic cancer Neg Hx      Prior to Admission medications   Medication Sig Start Date End Date Taking? Authorizing Provider  azelastine (ASTELIN) 0.1 % nasal spray Place 1 spray into both nostrils 2 (two) times daily as needed for rhinitis or allergies. 09/17/22 10/10/23  [provider]  calcium  acetate (PHOSLO ) 667 MG  capsule Take 667-1,334 mg by mouth See admin instructions. Take 1,334 mg by mouth once a day and 667 mg in the evening    [provider]  calcium  carbonate (TUMS) 500 MG chewable tablet Chew 1 tablet by mouth 3 (three) times daily as needed for indigestion or heartburn. 03/18/22   [provider]  CREON  36000-114000 units CPEP capsule Take 36,000-72,000 Units by mouth See admin instructions. Take 72,000 units by mouth three time a day with meals and 36,000 units three times a day with snacks    [provider]  CREON  36000-114000 units CPEP capsule Take 2 capsules by mouth 3 (three) times daily before meals. Take one capsule for snack in addition to TID 01/08/24   [provider]  diphenhydrAMINE  (BENADRYL ) 25 mg capsule Take 25 mg by mouth every 4 (four) hours as needed for itching or allergies.    [provider]  ferrous sulfate  325 (65 FE) MG EC tablet Take 325 mg by mouth daily with breakfast.    [provider]  ipratropium (ATROVENT) 0.03 % nasal spray Place 2 sprays into both nostrils every 12 (twelve) hours as needed for rhinitis.    [provider]  levofloxacin  (LEVAQUIN ) 750 MG tablet Take 1 tablet (750 mg total) by mouth every other day. After dialysis 01/18/24   Ephriam Hashimoto, MD  LUMIGAN  0.01 % SOLN Place 1 drop into both eyes at bedtime.    [provider]  LUMIGAN  0.01 % SOLN Place 1 drop into both eyes at bedtime. 12/21/23   [provider]  mirtazapine  (REMERON ) 15 MG tablet Take 15 mg by mouth at bedtime.    [provider]  mirtazapine  (REMERON ) 15 MG tablet Take 15 mg by mouth daily. 11/22/23   [provider]  montelukast  (SINGULAIR ) 10 MG tablet Take 10 mg by mouth at bedtime.    [provider]  montelukast  (SINGULAIR ) 10 MG tablet Take 10 mg by mouth daily. 12/18/23   [provider]  ondansetron  (ZOFRAN -ODT) 8 MG disintegrating tablet Take 8 mg by mouth every 8  (eight) hours as needed for nausea or vomiting (dissolve orally).    [provider]  pantoprazole  (PROTONIX ) 40 MG tablet Take 1 tablet (40 mg total) by mouth 2 (two) times daily. 10/15/23   Macdonald Savoy, MD  pantoprazole  (PROTONIX ) 40 MG tablet Take 40 mg by mouth 2 (two) times daily. 11/26/23   [provider]  QUEtiapine  (SEROQUEL ) 100 MG tablet Take 200 mg by mouth at bedtime.    [provider]  QUEtiapine  Fumarate (SEROQUEL  XR) 150 MG 24 hr tablet Take 150 mg by mouth at bedtime. 11/27/23   [provider]  sertraline  (ZOLOFT ) 100 MG tablet Take 200 mg by mouth in the morning.    [provider]  sertraline  (ZOLOFT ) 100 MG tablet Take 200 mg by mouth daily. 11/27/23   [provider]  sevelamer  carbonate (RENVELA ) 800 MG tablet Take 1,600 mg by mouth  in the morning and at bedtime. 12/29/23   [provider]  sildenafil (VIAGRA) 100 MG tablet Take 100 mg by mouth daily as needed for erectile dysfunction. 10/29/22   [provider]  sildenafil (VIAGRA) 100 MG tablet Take 100 mg by mouth as needed for erectile dysfunction. 12/17/23   [provider]  timolol  (BETIMOL ) 0.25 % ophthalmic solution Place 1 drop into both eyes in the morning.    [provider]  timolol  (TIMOPTIC ) 0.5 % ophthalmic solution Place 1 drop into both eyes every morning. 12/18/23   [provider]  TYLENOL  500 MG tablet Take 500-1,000 mg by mouth every 6 (six) hours as needed for mild pain (pain score 1-3) or headache.    [provider]  zolpidem  (AMBIEN ) 10 MG tablet Take 10 mg by mouth at bedtime as needed for sleep.    [provider]  zolpidem  (AMBIEN ) 10 MG tablet Take 10 mg by mouth daily as needed for sleep.    [provider]    Physical Exam: BP (!) 142/71 (BP Location: Left Arm)   Pulse (!) 117   Temp 100.1 F (37.8 C) (Oral)   Resp 20   Ht 6\' 1"  (1.854 m)   Wt 88.5 kg   SpO2 100%    BMI 25.73 kg/m  General:  Alert, oriented, calm, in no acute distress, looks comfortable but tired, looks nontoxic Neck: supple, no masses, trachea mildline, he has a right IJ line, site is benign appearing, and nontender Otoscopic exam of the right ear reveals quite a bit of cerumen, unable to completely visualize eardrum, but no obvious effusion or erythema seen Cardiovascular: RRR, no murmurs or rubs, no peripheral edema  Respiratory: clear to auscultation bilaterally, no wheezes, no crackles  Abdomen: soft, nontender, nondistended, normal bowel tones heard  Skin: dry, no rashes  Musculoskeletal: no joint effusions, normal range of motion  Psychiatric: appropriate affect, normal speech  Neurologic: extraocular muscles intact, clear speech, moving all extremities with intact sensorium         Labs on Admission:  Basic Metabolic Panel: Recent Labs  Lab 02/04/24 1120  NA 136  K 3.3*  CL 101  CO2 22  GLUCOSE 108*  BUN 50*  CREATININE 5.66*  CALCIUM  9.4   Liver Function Tests: Recent Labs  Lab 02/04/24 1120  AST 18  ALT 10  ALKPHOS 66  BILITOT 0.6  PROT 7.5  ALBUMIN  4.0   Recent Labs  Lab 02/04/24 1128  LIPASE 99*   No results for input(s): "AMMONIA" in the last 168 hours. CBC: Recent Labs  Lab 02/04/24 1120  WBC 10.8*  NEUTROABS 9.1*  HGB 11.7*  HCT 35.5*  MCV 90.1  PLT 241   Cardiac Enzymes: No results for input(s): "CKTOTAL", "CKMB", "CKMBINDEX", "TROPONINI" in the last 168 hours. BNP (last 3 results) No results for input(s): "BNP" in the last 8760 hours.  ProBNP (last 3 results) No results for input(s): "PROBNP" in the last 8760 hours.  CBG: No results for input(s): "GLUCAP" in the last 168 hours.  Radiological Exams on Admission: DG Chest Portable 1 View Result Date: 02/04/2024 CLINICAL DATA:  Fever EXAM: PORTABLE CHEST 1 VIEW COMPARISON:  January 16, 2024 FINDINGS: The heart size and mediastinal contours are within normal limits. Both lungs are  clear. The visualized skeletal structures are unremarkable. January 16, 2024 no change right IJ dialysis catheter IMPRESSION: No active disease. Electronically Signed   By: Fredrich Jefferson M.D.   On: 02/04/2024  13:14   Assessment/Plan Manuel Ortega is a 73 y.o. male with medical history significant for glaucoma, hypertension, prostate cancer, ESRD on Tuesday Thursday Saturday dialysis being admitted to the hospital with concern for sepsis of unclear etiology.  Sepsis-meeting criteria with tachycardia, leukocytosis, possibly due to acute infection given shaking chills.  Lactic acid is elevated to 2.9.  Etiology of his infection is unclear at the time.  He would be at risk for line infection, recurrent UTI, bacteremia.  Right-sided inner ear pain and sinus fullness raise concern for possible sinusitis. -Inpatient admission to [facility] -Follow-up blood cultures -Urinalysis is ordered and pending -Continue empiric IV vancomycin  and IV cefepime  -Will obtain noncontrast CT sinus  ESRD-patient completed majority of his dialysis session today, appears euvolemic on exam without any need for urgent dialysis -Will plan to resume scheduled Tuesday Thursday Saturday dialysis -Message sent to nephrology Dr. Yvonnie Heritage requesting that they follow-up for his routine dialysis  Pancreatic insufficiency-continue Creon   Depression/anxiety-his home medications will be resumed once reconciled  DVT prophylaxis: Subcutaneous heparin     Code Status: Full Code  Consults called: Nephrology for routine HD  Admission status: The appropriate patient status for this patient is INPATIENT. Inpatient status is judged to be reasonable and necessary in order to provide the required intensity of service to ensure the patient's safety. The patient's presenting symptoms, physical exam findings, and initial radiographic and laboratory data in the context of their chronic comorbidities is felt to place them at high risk for further  clinical deterioration. Furthermore, it is not anticipated that the patient will be medically stable for discharge from the hospital within 2 midnights of admission.    I certify that at the point of admission it is my clinical judgment that the patient will require inpatient hospital care spanning beyond 2 midnights from the point of admission due to high intensity of service, high risk for further deterioration and high frequency of surveillance required  Time spent: 59 minutes  Jada Kuhnert Rickey Charm MD [facility] Pager [phone]  If 7PM-7AM, please contact night-coverage [email or web address] Password [facility]  02/04/2024, 2:34 PM

## 2024-02-05 ENCOUNTER — Encounter (HOSPITAL_COMMUNITY): Payer: Self-pay | Admitting: Internal Medicine

## 2024-02-05 ENCOUNTER — Inpatient Hospital Stay (HOSPITAL_COMMUNITY)

## 2024-02-05 DIAGNOSIS — R6520 Severe sepsis without septic shock: Secondary | ICD-10-CM | POA: Diagnosis not present

## 2024-02-05 DIAGNOSIS — R509 Fever, unspecified: Secondary | ICD-10-CM | POA: Diagnosis not present

## 2024-02-05 DIAGNOSIS — A419 Sepsis, unspecified organism: Secondary | ICD-10-CM | POA: Diagnosis not present

## 2024-02-05 LAB — CBC WITH DIFFERENTIAL/PLATELET
Abs Immature Granulocytes: 0.05 10*3/uL (ref 0.00–0.07)
Basophils Absolute: 0 10*3/uL (ref 0.0–0.1)
Basophils Relative: 0 %
Eosinophils Absolute: 0.1 10*3/uL (ref 0.0–0.5)
Eosinophils Relative: 1 %
HCT: 31.2 % — ABNORMAL LOW (ref 39.0–52.0)
Hemoglobin: 10.4 g/dL — ABNORMAL LOW (ref 13.0–17.0)
Immature Granulocytes: 1 %
Lymphocytes Relative: 19 %
Lymphs Abs: 1.7 10*3/uL (ref 0.7–4.0)
MCH: 30.1 pg (ref 26.0–34.0)
MCHC: 33.3 g/dL (ref 30.0–36.0)
MCV: 90.2 fL (ref 80.0–100.0)
Monocytes Absolute: 1.5 10*3/uL — ABNORMAL HIGH (ref 0.1–1.0)
Monocytes Relative: 16 %
Neutro Abs: 5.9 10*3/uL (ref 1.7–7.7)
Neutrophils Relative %: 63 %
Platelets: 237 10*3/uL (ref 150–400)
RBC: 3.46 MIL/uL — ABNORMAL LOW (ref 4.22–5.81)
RDW: 14.6 % (ref 11.5–15.5)
WBC: 9.3 10*3/uL (ref 4.0–10.5)
nRBC: 0 % (ref 0.0–0.2)

## 2024-02-05 LAB — BASIC METABOLIC PANEL WITH GFR
Anion gap: 14 (ref 5–15)
BUN: 56 mg/dL — ABNORMAL HIGH (ref 8–23)
CO2: 23 mmol/L (ref 22–32)
Calcium: 9.1 mg/dL (ref 8.9–10.3)
Chloride: 101 mmol/L (ref 98–111)
Creatinine, Ser: 7.27 mg/dL — ABNORMAL HIGH (ref 0.61–1.24)
GFR, Estimated: 7 mL/min — ABNORMAL LOW (ref >60)
Glucose, Bld: 97 mg/dL (ref 70–99)
Potassium: 4 mmol/L (ref 3.5–5.1)
Sodium: 138 mmol/L (ref 135–145)

## 2024-02-05 LAB — RESP PANEL BY RT-PCR (RSV, FLU A&B, COVID)  RVPGX2
Influenza A by PCR: NEGATIVE
Influenza B by PCR: NEGATIVE
Resp Syncytial Virus by PCR: NEGATIVE
SARS Coronavirus 2 by RT PCR: NEGATIVE

## 2024-02-05 LAB — HEPATITIS B SURFACE ANTIGEN: Hepatitis B Surface Ag: NONREACTIVE

## 2024-02-05 LAB — PHOSPHORUS: Phosphorus: 3.7 mg/dL (ref 2.5–4.6)

## 2024-02-05 LAB — C-REACTIVE PROTEIN: CRP: 10.6 mg/dL — ABNORMAL HIGH (ref <1.0)

## 2024-02-05 LAB — MRSA NEXT GEN BY PCR, NASAL: MRSA by PCR Next Gen: NOT DETECTED

## 2024-02-05 LAB — PROCALCITONIN: Procalcitonin: 9.29 ng/mL

## 2024-02-05 MED ORDER — VANCOMYCIN HCL IN DEXTROSE 1-5 GM/200ML-% IV SOLN
1000.0000 mg | Freq: Once | INTRAVENOUS | Status: AC
  Administered 2024-02-05: 1000 mg via INTRAVENOUS

## 2024-02-05 MED ORDER — CHLORHEXIDINE GLUCONATE CLOTH 2 % EX PADS
6.0000 | MEDICATED_PAD | Freq: Every day | CUTANEOUS | Status: DC
  Administered 2024-02-05 – 2024-02-07 (×3): 6 via TOPICAL

## 2024-02-05 MED ORDER — TIMOLOL MALEATE 0.5 % OP SOLN
1.0000 [drp] | Freq: Every morning | OPHTHALMIC | Status: DC
Start: 2024-02-06 — End: 2024-02-10
  Administered 2024-02-06 – 2024-02-10 (×5): 1 [drp] via OPHTHALMIC
  Filled 2024-02-05: qty 5

## 2024-02-05 MED ORDER — VANCOMYCIN HCL IN DEXTROSE 1-5 GM/200ML-% IV SOLN: 1000.0000 mg | INTRAVENOUS | Status: DC

## 2024-02-05 MED ORDER — LORATADINE 10 MG PO TABS
10.0000 mg | ORAL_TABLET | Freq: Every day | ORAL | Status: DC
  Administered 2024-02-05 – 2024-02-10 (×6): 10 mg via ORAL
  Filled 2024-02-05 (×6): qty 1

## 2024-02-05 MED ORDER — BIMATOPROST 0.01 % OP SOLN
1.0000 [drp] | Freq: Every day | OPHTHALMIC | Status: DC
  Administered 2024-02-05 – 2024-02-09 (×5): 1 [drp] via OPHTHALMIC
  Filled 2024-02-05: qty 2.5

## 2024-02-05 MED ORDER — VANCOMYCIN HCL IN DEXTROSE 1-5 GM/200ML-% IV SOLN
INTRAVENOUS | Status: AC
  Filled 2024-02-05: qty 200

## 2024-02-05 NOTE — Final Progress Note (Signed)
 Talked to the patient about his due CT of the abdomen/pelvis with contrast. Patient doesn't want to go as he said he just had the scan a week ago and wants to talk to the doctor about the procedure and as well as receiving contrast given his CKD status. Dr. Zelda Hickman made aware.

## 2024-02-05 NOTE — Consult Note (Signed)
 [facility]  Total days of antibiotics 2/vanco and cefepime                Reason for Consult: fever for HD    Referring Physician: singh  Principal Problem:   Sepsis (HCC)    HPI: Manuel Ortega is a 73 y.o. male  with HTN, pancreatic insufficiency, prostate ca s/p  TURP, ESRD on T-Th-Sat via HD line who recently started to notice chills, rigors when he was at dialysis this week requiring HD session to be cut short. She reports that he has not had any sick contacts, denies cough or nausea and vomiting/Diarrhea. He was hospitalized in early April with sepsis with similar symptoms of fevers, chills, but vomiting did have lactic acidosis - treated as pyelonephritis with 7 day course of levofloxacin .  Given that he has had 2 episodes  of fevers in the past month, concern that his hd line is seeded. He is on empiric vancomycin  plus cefepime .   No rash  Sochx: Secondary school teacher at [employer]  Past Medical History:  Diagnosis Date   AKI (acute kidney injury) (HCC) 05/2020   possibly due to prostate blockage   Anxiety    Arthritis    Depression    Dyspnea    Glaucoma    Hypertension    Prostate cancer (HCC)    Renal disorder    Dialysis T/th/sat    Allergies: No Known Allergies  Current antibiotics:   MEDICATIONS:  [START ON 02/06/2024] calcitRIOL   1.25 mcg Oral Q T,Th,Sa-HD   Chlorhexidine  Gluconate Cloth  6 each Topical Q0600   [START ON 02/06/2024] cinacalcet   30 mg Oral Q T,Th,Sa-HD   heparin   5,000 Units Subcutaneous Q8H   lipase/protease/amylase  72,000 Units Oral TID AC   loratadine   10 mg Oral Daily   sevelamer  carbonate  1,600 mg Oral BID WC    Social History   Tobacco Use   Smoking status: Never    Passive exposure: Never   Smokeless tobacco: Never  Vaping Use   Vaping status: Never Used  Substance Use Topics   Alcohol use: Not Currently    Alcohol/week: 1.0 standard drink of alcohol    Types: 1 Glasses of wine per week    Comment: rare    Drug use: Not Currently    Family History  Problem Relation Age of Onset   Stroke Mother    Breast cancer Neg Hx    Colon cancer Neg Hx    Prostate cancer Neg Hx    Pancreatic cancer Neg Hx     Review of Systems -  12 point ros is negative except what is mentioned above  OBJECTIVE: Temp:  [97.8 F (36.6 C)-98.7 F (37.1 C)] 97.8 F (36.6 C) (04/25 1426) Pulse Rate:  [73-85] 76 (04/25 1630) Resp:  [13-24] 15 (04/25 1630) BP: (93-144)/(51-86) 142/77 (04/25 1630) SpO2:  [95 %-100 %] 100 % (04/25 1630) Physical Exam  Constitutional: He is oriented to person, place, and time. He appears well-developed and well-nourished. No distress.  HENT:  Mouth/Throat: Oropharynx is clear and moist. No oropharyngeal exudate.  Cardiovascular: Normal rate, regular rhythm and normal heart sounds. Exam reveals no gallop and no friction rub.  No murmur heard.  Chest wall: no tenderness about HD line Pulmonary/Chest: Effort normal and breath sounds normal. No respiratory distress. He has no wheezes.  Abdominal: Soft. Bowel sounds are normal. He exhibits no distension. There is no tenderness.  Lymphadenopathy:  He has no cervical  adenopathy.  Neurological: He is alert and oriented to person, place, and time.  Skin: Skin is warm and dry. No rash noted. No erythema.  Psychiatric: He has a normal mood and affect. His behavior is normal.    LABS: Results for orders placed or performed during the hospital encounter of 02/04/24 (from the past 48 hours)  Comprehensive metabolic panel     Status: Abnormal   Collection Time: 02/04/24 11:20 AM  Result Value Ref Range   Sodium 136 135 - 145 mmol/L   Potassium 3.3 (L) 3.5 - 5.1 mmol/L   Chloride 101 98 - 111 mmol/L   CO2 22 22 - 32 mmol/L   Glucose, Bld 108 (H) 70 - 99 mg/dL    Comment: Glucose reference range applies only to samples taken after fasting for at least 8 hours.   BUN 50 (H) 8 - 23 mg/dL   Creatinine, Ser 1.61 (H) 0.61 - 1.24 mg/dL    Calcium  9.4 8.9 - 10.3 mg/dL   Total Protein 7.5 6.5 - 8.1 g/dL   Albumin  4.0 3.5 - 5.0 g/dL   AST 18 15 - 41 U/L   ALT 10 0 - 44 U/L   Alkaline Phosphatase 66 38 - 126 U/L   Total Bilirubin 0.6 0.0 - 1.2 mg/dL   GFR, Estimated 10 (L) >60 mL/min    Comment: (NOTE) Calculated using the CKD-EPI Creatinine Equation (2021)    Anion gap 13 5 - 15    Comment: Performed at [facility], [address] [place], [place] [address]  CBC with Differential     Status: Abnormal   Collection Time: 02/04/24 11:20 AM  Result Value Ref Range   WBC 10.8 (H) 4.0 - 10.5 K/uL   RBC 3.94 (L) 4.22 - 5.81 MIL/uL   Hemoglobin 11.7 (L) 13.0 - 17.0 g/dL   HCT 54.0 (L) 98.1 - 19.1 %   MCV 90.1 80.0 - 100.0 fL   MCH 29.7 26.0 - 34.0 pg   MCHC 33.0 30.0 - 36.0 g/dL   RDW 47.8 29.5 - 62.1 %   Platelets 241 150 - 400 K/uL   nRBC 0.0 0.0 - 0.2 %   Neutrophils Relative % 84 %   Neutro Abs 9.1 (H) 1.7 - 7.7 K/uL   Lymphocytes Relative 8 %   Lymphs Abs 0.9 0.7 - 4.0 K/uL   Monocytes Relative 7 %   Monocytes Absolute 0.8 0.1 - 1.0 K/uL   Eosinophils Relative 0 %   Eosinophils Absolute 0.0 0.0 - 0.5 K/uL   Basophils Relative 0 %   Basophils Absolute 0.0 0.0 - 0.1 K/uL   Immature Granulocytes 1 %   Abs Immature Granulocytes 0.05 0.00 - 0.07 K/uL    Comment: Performed at [facility], [address], [place], [place] [address]  Protime-INR     Status: None   Collection Time: 02/04/24 11:20 AM  Result Value Ref Range   Prothrombin Time 13.1 11.4 - 15.2 seconds   INR 1.0 0.8 - 1.2    Comment: (NOTE) INR goal varies based on device and disease states. Performed at [facility], [address], [place], [place] [address]   Culture, blood (Routine x 2)     Status: None (Preliminary result)   Collection Time: 02/04/24 11:20 AM   Specimen: BLOOD  Result Value Ref Range   Specimen Description      BLOOD RIGHT ANTECUBITAL Performed at [facility], [address] [place], [place] [address]    Special Requests      BOTTLES DRAWN AEROBIC ONLY Blood Culture results may not be optimal due to an inadequate volume of blood received in culture bottles Performed at [facility], [address], [place], [place] [address]    Culture      NO GROWTH < 24 HOURS Performed at [facility], [address], [place], [place] [address]    Report Status PENDING   Culture, blood (Routine x 2)     Status: None (Preliminary result)   Collection Time: 02/04/24 11:20 AM   Specimen: BLOOD  Result Value Ref Range   Specimen Description      BLOOD LEFT ANTECUBITAL Performed at [facility], [address] [place], [place] [address]    Special Requests      BOTTLES DRAWN AEROBIC AND ANAEROBIC Blood Culture results may not be optimal due to an inadequate volume of blood received in culture bottles Performed at [facility], [address], [place], [place] [address]    Culture      NO GROWTH < 24 HOURS Performed at [facility], [address], [place], [place] [address]    Report Status PENDING   I-Stat Lactic Acid, ED     Status: Abnormal   Collection Time: 02/04/24 11:27 AM  Result Value Ref Range   Lactic Acid, Venous 2.9 (HH) 0.5 - 1.9 mmol/L   Comment NOTIFIED PHYSICIAN   Lipase, blood     Status: Abnormal   Collection Time: 02/04/24 11:28 AM  Result Value Ref Range   Lipase 99 (H) 11 - 51 U/L    Comment: Performed at [facility], [address] [place], [place] [address]  Resp panel by RT-PCR (RSV, Flu A&B, Covid) Anterior Nasal Swab     Status: None   Collection Time: 02/04/24 11:50 AM   Specimen: Anterior Nasal Swab  Result Value Ref Range   SARS Coronavirus 2 by RT PCR NEGATIVE NEGATIVE    Comment: (NOTE) SARS-CoV-2 target nucleic acids are NOT DETECTED.  The SARS-CoV-2 RNA is generally detectable in upper  respiratory specimens during the acute phase of infection. The lowest concentration of SARS-CoV-2 viral copies this assay can detect is 138 copies/mL. A negative result does not preclude SARS-Cov-2 infection and should not be used as the sole basis for treatment or other patient management decisions. A negative result may occur with  improper specimen collection/handling, submission of specimen other than nasopharyngeal swab, presence of viral mutation(s) within the areas targeted by this assay, and inadequate number of viral copies(<138 copies/mL). A negative result must be combined with clinical observations, patient history, and epidemiological information. The expected result is Negative.  Fact Sheet for Patients:  BloggerCourse.com  Fact Sheet for Healthcare Providers:  SeriousBroker.it  This test is no t yet approved or cleared by the [place]  FDA and  has been authorized for detection and/or diagnosis of SARS-CoV-2 by FDA under an Emergency Use Authorization (EUA). This EUA will remain  in effect (meaning this test can be used) for the duration of the COVID-19 declaration under Section 564(b)(1) of the Act, 21 U.S.C.section 360bbb-3(b)(1), unless the authorization is terminated  or revoked sooner.       Influenza A by PCR NEGATIVE NEGATIVE   Influenza B by PCR NEGATIVE NEGATIVE    Comment: (NOTE) The Xpert Xpress SARS-CoV-2/FLU/RSV plus assay is intended as an aid in the diagnosis of  influenza from Nasopharyngeal swab specimens and should not be used as a sole basis for treatment. Nasal washings and aspirates are unacceptable for Xpert Xpress SARS-CoV-2/FLU/RSV testing.  Fact Sheet for Patients: BloggerCourse.com  Fact Sheet for Healthcare Providers: SeriousBroker.it  This test is not yet approved or cleared by the [place]  FDA and has been authorized for  detection and/or diagnosis of SARS-CoV-2 by FDA under an Emergency Use Authorization (EUA). This EUA will remain in effect (meaning this test can be used) for the duration of the COVID-19 declaration under Section 564(b)(1) of the Act, 21 U.S.C. section 360bbb-3(b)(1), unless the authorization is terminated or revoked.     Resp Syncytial Virus by PCR NEGATIVE NEGATIVE    Comment: (NOTE) Fact Sheet for Patients: BloggerCourse.com  Fact Sheet for Healthcare Providers: SeriousBroker.it  This test is not yet approved or cleared by the [place]  FDA and has been authorized for detection and/or diagnosis of SARS-CoV-2 by FDA under an Emergency Use Authorization (EUA). This EUA will remain in effect (meaning this test can be used) for the duration of the COVID-19 declaration under Section 564(b)(1) of the Act, 21 U.S.C. section 360bbb-3(b)(1), unless the authorization is terminated or revoked.  Performed at [facility], [address], [place], [place] [address]   I-Stat Lactic Acid     Status: None   Collection Time: 02/04/24  5:23 PM  Result Value Ref Range   Lactic Acid, Venous 0.9 0.5 - 1.9 mmol/L  Urinalysis, w/ Reflex to Culture (Infection Suspected) -Urine, Clean Catch     Status: Abnormal   Collection Time: 02/04/24  8:10 PM  Result Value Ref Range   Specimen Source URINE, CATHETERIZED    Color, Urine STRAW (A) YELLOW   APPearance CLEAR CLEAR   Specific Gravity, Urine 1.009 1.005 - 1.030   pH 8.0 5.0 - 8.0   Glucose, UA NEGATIVE NEGATIVE mg/dL   Hgb urine dipstick SMALL (A) NEGATIVE   Bilirubin Urine NEGATIVE NEGATIVE   Ketones, ur NEGATIVE NEGATIVE mg/dL   Protein, ur 478 (A) NEGATIVE mg/dL   Nitrite NEGATIVE NEGATIVE   Leukocytes,Ua NEGATIVE NEGATIVE   RBC / HPF 0-5 0 - 5 RBC/hpf   WBC, UA 0-5 0 - 5 WBC/hpf    Comment:        Reflex urine culture not performed if WBC <=10, OR if Squamous  epithelial cells >5. If Squamous epithelial cells >5 suggest recollection.    Bacteria, UA RARE (A) NONE SEEN   Squamous Epithelial / HPF 0-5 0 - 5 /HPF    Comment: Performed at [facility], [address] [place], [place] [address]  Resp panel by RT-PCR (RSV, Flu A&B, Covid) Anterior Nasal Swab     Status: None   Collection Time: 02/05/24  7:19 AM   Specimen: Anterior Nasal Swab  Result Value Ref Range   SARS Coronavirus 2 by RT PCR NEGATIVE NEGATIVE   Influenza A by PCR NEGATIVE NEGATIVE   Influenza B by PCR NEGATIVE NEGATIVE    Comment: (NOTE) The Xpert Xpress SARS-CoV-2/FLU/RSV plus assay is intended as an aid in the diagnosis of influenza from Nasopharyngeal swab specimens and should not be used as a sole basis for treatment. Nasal washings and aspirates are unacceptable for Xpert Xpress SARS-CoV-2/FLU/RSV testing.  Fact Sheet for Patients: BloggerCourse.com  Fact Sheet for Healthcare Providers: SeriousBroker.it  This test is not yet approved or cleared by the [place]  FDA and has been authorized for detection and/or diagnosis of SARS-CoV-2  by FDA under an Emergency Use Authorization (EUA). This EUA will remain in effect (meaning this test can be used) for the duration of the COVID-19 declaration under Section 564(b)(1) of the Act, 21 U.S.C. section 360bbb-3(b)(1), unless the authorization is terminated or revoked.     Resp Syncytial Virus by PCR NEGATIVE NEGATIVE    Comment: (NOTE) Fact Sheet for Patients: BloggerCourse.com  Fact Sheet for Healthcare Providers: SeriousBroker.it  This test is not yet approved or cleared by the [place]  FDA and has been authorized for detection and/or diagnosis of SARS-CoV-2 by FDA under an Emergency Use Authorization (EUA). This EUA will remain in effect (meaning this test can be used) for the duration of  the COVID-19 declaration under Section 564(b)(1) of the Act, 21 U.S.C. section 360bbb-3(b)(1), unless the authorization is terminated or revoked.  Performed at [facility], [address], [place], [place] [address]   MRSA Next Gen by PCR, Nasal     Status: None   Collection Time: 02/05/24  7:37 AM   Specimen: Anterior Nasal Swab  Result Value Ref Range   MRSA by PCR Next Gen NOT DETECTED NOT DETECTED    Comment: (NOTE) The GeneXpert MRSA Assay (FDA approved for NASAL specimens only), is one component of a comprehensive MRSA colonization surveillance program. It is not intended to diagnose MRSA infection nor to guide or monitor treatment for MRSA infections. Test performance is not FDA approved in patients less than 61 years old. Performed at [facility], [address], [place], [place] [address]   Basic metabolic panel     Status: Abnormal   Collection Time: 02/05/24  7:42 AM  Result Value Ref Range   Sodium 138 135 - 145 mmol/L   Potassium 4.0 3.5 - 5.1 mmol/L   Chloride 101 98 - 111 mmol/L   CO2 23 22 - 32 mmol/L   Glucose, Bld 97 70 - 99 mg/dL    Comment: Glucose reference range applies only to samples taken after fasting for at least 8 hours.   BUN 56 (H) 8 - 23 mg/dL   Creatinine, Ser 8.65 (H) 0.61 - 1.24 mg/dL   Calcium  9.1 8.9 - 10.3 mg/dL   GFR, Estimated 7 (L) >60 mL/min    Comment: (NOTE) Calculated using the CKD-EPI Creatinine Equation (2021)    Anion gap 14 5 - 15    Comment: Performed at [facility], [address], [place], [place] [address]  Phosphorus     Status: None   Collection Time: 02/05/24  7:42 AM  Result Value Ref Range   Phosphorus 3.7 2.5 - 4.6 mg/dL    Comment: Performed at [facility], [address], [place], [place] [address]  C-reactive protein     Status: Abnormal   Collection Time: 02/05/24  7:42 AM  Result Value Ref Range   CRP 10.6 (H) <1.0 mg/dL    Comment: Performed at [facility], [address] [place], [place] [address]  Procalcitonin     Status: None   Collection Time: 02/05/24  7:42 AM  Result Value Ref Range   Procalcitonin 9.29 ng/mL    Comment:        Interpretation: PCT > 2 ng/mL: Systemic infection (sepsis) is likely, unless other causes are known. (NOTE)       Sepsis PCT Algorithm           Lower Respiratory Tract  Infection PCT Algorithm    ----------------------------     ----------------------------         PCT < 0.25 ng/mL                PCT < 0.10 ng/mL          Strongly encourage             Strongly discourage   discontinuation of antibiotics    initiation of antibiotics    ----------------------------     -----------------------------       PCT 0.25 - 0.50 ng/mL            PCT 0.10 - 0.25 ng/mL               OR       >80% decrease in PCT            Discourage initiation of                                            antibiotics      Encourage discontinuation           of antibiotics    ----------------------------     -----------------------------         PCT >= 0.50 ng/mL              PCT 0.26 - 0.50 ng/mL               AND       <80% decrease in PCT              Encourage initiation of                                             antibiotics       Encourage continuation           of antibiotics    ----------------------------     -----------------------------        PCT >= 0.50 ng/mL                  PCT > 0.50 ng/mL               AND         increase in PCT                  Strongly encourage                                      initiation of antibiotics    Strongly encourage escalation           of antibiotics                                     -----------------------------                                           PCT <= 0.25 ng/mL  OR                                        > 80% decrease in PCT                                      Discontinue / Do not  initiate                                             antibiotics  Performed at [facility], [address], [place], [place] [address]   CBC with Differential/Platelet     Status: Abnormal   Collection Time: 02/05/24  7:42 AM  Result Value Ref Range   WBC 9.3 4.0 - 10.5 K/uL   RBC 3.46 (L) 4.22 - 5.81 MIL/uL   Hemoglobin 10.4 (L) 13.0 - 17.0 g/dL   HCT 08.6 (L) 57.8 - 46.9 %   MCV 90.2 80.0 - 100.0 fL   MCH 30.1 26.0 - 34.0 pg   MCHC 33.3 30.0 - 36.0 g/dL   RDW 62.9 52.8 - 41.3 %   Platelets 237 150 - 400 K/uL   nRBC 0.0 0.0 - 0.2 %   Neutrophils Relative % 63 %   Neutro Abs 5.9 1.7 - 7.7 K/uL   Lymphocytes Relative 19 %   Lymphs Abs 1.7 0.7 - 4.0 K/uL   Monocytes Relative 16 %   Monocytes Absolute 1.5 (H) 0.1 - 1.0 K/uL   Eosinophils Relative 1 %   Eosinophils Absolute 0.1 0.0 - 0.5 K/uL   Basophils Relative 0 %   Basophils Absolute 0.0 0.0 - 0.1 K/uL   Immature Granulocytes 1 %   Abs Immature Granulocytes 0.05 0.00 - 0.07 K/uL    Comment: Performed at [facility], [address] [place], [place] [address]  Hepatitis B surface antigen     Status: None   Collection Time: 02/05/24 11:52 AM  Result Value Ref Range   Hepatitis B Surface Ag NON REACTIVE NON REACTIVE    Comment: Performed at [facility], [address] [place], [place] [address]    MICRO:  IMAGING: DG Chest Port 1 View Result Date: 02/05/2024 CLINICAL DATA:  [id number] SOB (shortness of breath) [id number] EXAM: PORTABLE CHEST - 1 VIEW COMPARISON:  February 04, 2024 FINDINGS: No focal airspace consolidation, pleural effusion, or pneumothorax. No cardiomegaly. Right hemodialysis catheter terminates in the lower SVC. No acute fracture or destructive lesion. Multilevel thoracic osteophytosis. IMPRESSION: No significant interval change.  No pneumonia or pulmonary edema. Electronically Signed   By: Rance Burrows M.D.   On: 02/05/2024 08:27   CT SINUS WO CONTRAST Result Date: 02/04/2024 CLINICAL DATA:   73 year old male with fullness, discomfort around the right eye, sinuses. Right side inner ear pain. EXAM: CT MAXILLOFACIAL WITHOUT CONTRAST TECHNIQUE: Multidetector CT images of the paranasal sinuses were obtained using the standard protocol without intravenous contrast. RADIATION DOSE REDUCTION: This exam was performed according to the departmental dose-optimization program which includes automated exposure control, adjustment of the mA and/or kV according to patient size and/or use of iterative reconstruction technique. COMPARISON:  Brain MRI 05/05/2023. PARANASAL SINUSES: PARANASAL SINUSES Mild motion artifact. Frontal: Chronically opacified  right lateral frontal sinus. Right frontoethmoidal recess mucosal thickening is in an area of bubbly opacity on the MRI last year. Left frontal sinus and frontoethmoidal recess remain well aerated. Ethmoid: Well aerated. Maxillary: Chronic left maxillary sinusitis with heterogeneous mucoperiosteal thickening, subtotal sinus opacification which appears stable from last year. Contralateral right maxillary sinus mild mucosal thickening, posterior alveolar recess small retention cysts. Sphenoid: Well aerated. Hyperplastic. Mild anterior right sphenoid sinus mucosal thickening and/or retention cysts. Right ostiomeatal unit: Patent on coronal image 33. Left ostiomeatal unit: Opacified. Nasal passages: Nasal septum is intact with rightward septal deviation and spurring. Fairly symmetric bilateral nasal cavity mucosal thickening and mild retained secretions in the inferior meatus. Anatomy: Mild motion artifact. Anterior modal artery position suspected on coronal image 35 and 36 with pneumatization above both notches. Symmetric olfactory grooves and fovea ethmoidalis, Keros II (4-73mm). Sellar sphenoid pneumatization pattern. No clinoid process pneumatization. No Onodi cell. Other: Visible brain parenchyma appears stable from the MRI last year, negative for age. Calcified  atherosclerosis at the skull base. Visualized orbits and scalp soft tissues are within normal limits. Negative visible noncontrast deep soft tissue spaces of the face. No acute maxillary dental disease, absent posterior maxillary dentition. Normal bone mineralization at the skull base. Bilateral tympanic cavities and mastoids are clear. There is bilateral external auditory canal debris or cerumen which appears greater on the right. IMPRESSION: 1. Chronic paranasal sinus disease, most pronounced in the left maxillary sinus. No significant change compared to head MRI 05/05/2023. 2. Symmetric nasal cavity mucosal thickening raising the possibility of Rhinitis. Rightward nasal septal deviation and spurring. 3. Bilateral middle ears and mastoids are clear. But there is right greater than left external auditory canal debris or cerumen,. Electronically Signed   By: Marlise Simpers M.D.   On: 02/04/2024 17:03   DG Chest Portable 1 View Result Date: 02/04/2024 CLINICAL DATA:  Fever EXAM: PORTABLE CHEST 1 VIEW COMPARISON:  January 16, 2024 FINDINGS: The heart size and mediastinal contours are within normal limits. Both lungs are clear. The visualized skeletal structures are unremarkable. January 16, 2024 no change right IJ dialysis catheter IMPRESSION: No active disease. Electronically Signed   By: Fredrich Jefferson M.D.   On: 02/04/2024 13:14    Assessment/Plan:  73yo M with ESRD on HD, found to have recurrent fevers, FUO, in the setting of HD line in place. Earlier in the month, treated for pyelonephritis, but no overt bacteremia was noted. Cxr ruled out pneumonia, sinus CT unrevealing. CT of C/A/P is pending  - blood cx are pending, but would still agree with plan to replace HD line and give line holiday as we wait for blood cx to finalize - await for results of chest abd pelvis CT to see if other causes of fever can be identified - if blood cx +, recommend to get TTE   I have personally spent 82 minutes involved in  face-to-face and non-face-to-face activities for this patient on the day of the visit. Professional time spent includes the following activities: Preparing to see the patient (review of tests), Obtaining and/or reviewing separately obtained history (admission/discharge record), Performing a medically appropriate examination and/or evaluation , Ordering medications/tests/procedures, referring and communicating with other health care professionals, Documenting clinical information in the EMR, Independently interpreting results (not separately reported), Communicating results to the patient/family/caregiver, Counseling and educating the patient/family/caregiver and Care coordination (not separately reported).      Gerold Kos Levern Reader MD MPH [facility] [phone]  .

## 2024-02-05 NOTE — Progress Notes (Signed)
 Pharmacy consult to enter Lumigan  0.1% eye drops 1 drop both eyes daily at bedtime as Non-formulary per patient request by Dr. Seleta Dakins.   Order entered  bimatoprost  (LUMIGAN ) 0.01 % ophthalmic solution 1 drop 1 drop, Both Eyes, Daily at bedtime   Pharmacy to sign off consult.   Lenard Quam, PharmD, BCPS, BCCCP Please refer to [facility] for [facility] Pharmacy numbers

## 2024-02-05 NOTE — Plan of Care (Signed)

## 2024-02-05 NOTE — Progress Notes (Signed)
 PROGRESS NOTE                                                                                                                                                                                                             Patient Demographics:    Manuel Ortega, is a 73 y.o. male, DOB - 23-Dec-1950, NWG:[id number]  Outpatient Primary MD for the patient is Patient, No Pcp Per    LOS - 1  Admit date - 02/04/2024    Chief Complaint  Patient presents with   Chills   Fever       Brief Narrative (HPI from H&P)    73 y.o. male with medical history significant for glaucoma, hypertension, prostate cancer, ESRD on Tuesday Thursday Saturday dialysis being admitted to the hospital with concern for sepsis of unclear etiology.  He was recently hospitalized at [facility] earlier this month, for sepsis felt to be due to possible pyelonephritis.  He has been doing well, tolerating his dialysis, however yesterday he started feeling generally unwell, weak, vague nausea.  Today he went to dialysis, was feeling very very cold and chilled, says that he normally does feel cold and dialysis center, but when he comes out of there he really feels better.  Today he did not.  He cut his dialysis session short by 1 hour since he was not feeling well.   He was admitted for fever during HD x 2 in the last few weeks, mild leukocytosis, no clear source of infection.   Subjective:    Manuel Ortega today has, No headache, mild pressure in the right ear, mild pressure in the right frontal sinus, no chest pain, No abdominal pain - No Nausea, No new weakness tingling or numbness, mild dry cough   Assessment  & Plan :    Sepsis-meeting criteria with tachycardia, leukocytosis, possibly due to acute infection given shaking chills.  Lactic acid is elevated to 2.9.  Etiology of his infection is unclear at the time.  He would be at risk for line infection,  recurrent UTI, bacteremia.  Right-sided ear fullness with right frontal sinus fullness.  Likely seasonal allergies and blocked eustachian tube, does have some cerumen on exam in the right ear canal.  No tenderness on palpation on the right mastoid, right frontal and maxillary area, no tenderness on manipulation of his  right pinna.  Chest and abdominal exam benign, no skin rashes or bruises no joint pains or aches.  Of note patient has had a right subclavian HD catheter for 3 years, he has had 2 episodes of rigors, fevers with HD treatments in the last few weeks.  Suspicious for possible infection of his HD catheter, he is resistant to HD catheter removal obtaining a fistula or graft.  ID and nephrology following, currently on broad-spectrum antibiotics, monitor blood cultures and inflammatory markers.    ESRD-on TTS schedule, nephrology on board.   Pancreatic insufficiency-continue Creon    Depression/anxiety-his home medications will be resumed once reconciled  HX of lung nodule.  Will require outpatient pulmonary follow-up within a month of discharge.       Condition - Fair  Family Communication  :  None  Code Status :  Full  Consults  :  ID, renal  PUD Prophylaxis :     Procedures  :     CT   - 1. Chronic paranasal sinus disease, most pronounced in the left maxillary sinus. No significant change compared to head MRI 05/05/2023. 2. Symmetric nasal cavity mucosal thickening raising the possibility of Rhinitis. Rightward nasal septal deviation and spurring. 3. Bilateral middle ears and mastoids are clear. But there is right greater than left external auditory canal debris or cerumen,.      Disposition Plan  :    Status is: Inpatient   DVT Prophylaxis  :    heparin  injection 5,000 Units Start: 02/04/24 1445    Lab Results  Component Value Date   PLT 237 02/05/2024    Diet :  Diet Order             Diet regular Room service appropriate? Yes; Fluid consistency: Thin   Diet effective now                    Inpatient Medications  Scheduled Meds:  [START ON 02/06/2024] calcitRIOL   1.25 mcg Oral Q T,Th,Sa-HD   [START ON 02/06/2024] cinacalcet   30 mg Oral Q T,Th,Sa-HD   heparin   5,000 Units Subcutaneous Q8H   lipase/protease/amylase  72,000 Units Oral TID AC   loratadine   10 mg Oral Daily   sevelamer  carbonate  1,600 mg Oral BID WC   Continuous Infusions:  ceFEPime  (MAXIPIME ) IV     [START ON 02/06/2024] vancomycin      PRN Meds:.acetaminophen  **OR** acetaminophen , albuterol , iohexol , lipase/protease/amylase, pantoprazole     Objective:   Vitals:   02/04/24 2015 02/04/24 2030 02/04/24 2248 02/05/24 0343  BP:  (!) 105/51 93/67 (!) 94/54  Pulse: 81 85 85 74  Resp: 15 17 (!) 24 16  Temp:   97.9 F (36.6 C) 98.4 F (36.9 C)  TempSrc:   Oral Oral  SpO2: 98% 97% 100% 95%  Weight:      Height:        Wt Readings from Last 3 Encounters:  02/04/24 88.5 kg  01/22/24 96.6 kg  01/16/24 90.3 kg    No intake or output data in the 24 hours ending 02/05/24 0904   Physical Exam  Awake Alert, No new F.N deficits, Normal affect [place].AT,PERRAL Supple Neck, No JVD,   Symmetrical Chest wall movement, Good air movement bilaterally, CTAB, right chest wall HD catheter site appears clean RRR,No Gallops,Rubs or new Murmurs,  +ve B.Sounds, Abd Soft, No tenderness,   No Cyanosis, Clubbing or edema         Data Review:    Recent Labs  Lab 02/04/24 1120 02/05/24 0742  WBC 10.8* 9.3  HGB 11.7* 10.4*  HCT 35.5* 31.2*  PLT 241 237  MCV 90.1 90.2  MCH 29.7 30.1  MCHC 33.0 33.3  RDW 14.4 14.6  LYMPHSABS 0.9 1.7  MONOABS 0.8 1.5*  EOSABS 0.0 0.1  BASOSABS 0.0 0.0    Recent Labs  Lab 02/04/24 1120 02/04/24 1127 02/04/24 1723  NA 136  --   --   K 3.3*  --   --   CL 101  --   --   CO2 22  --   --   ANIONGAP 13  --   --   GLUCOSE 108*  --   --   BUN 50*  --   --   CREATININE 5.66*  --   --   AST 18  --   --   ALT 10  --   --   ALKPHOS  66  --   --   BILITOT 0.6  --   --   ALBUMIN  4.0  --   --   LATICACIDVEN  --  2.9* 0.9  INR 1.0  --   --   CALCIUM  9.4  --   --       Recent Labs  Lab 02/04/24 1120 02/04/24 1127 02/04/24 1723  LATICACIDVEN  --  2.9* 0.9  INR 1.0  --   --   CALCIUM  9.4  --   --     --------------------------------------------------------------------------------------------------------------- Lab Results  Component Value Date   CHOL 140 05/14/2023   HDL 45.00 05/14/2023   LDLCALC 72 05/14/2023   TRIG 115.0 05/14/2023   CHOLHDL 3 05/14/2023    Lab Results  Component Value Date   HGBA1C 5.6 05/14/2023      Micro Results Recent Results (from the past 240 hours)  Culture, blood (Routine x 2)     Status: None (Preliminary result)   Collection Time: 02/04/24 11:20 AM   Specimen: BLOOD  Result Value Ref Range Status   Specimen Description   Final    BLOOD RIGHT ANTECUBITAL Performed at [facility], [address] [place], [place] [address]    Special Requests   Final    BOTTLES DRAWN AEROBIC ONLY Blood Culture results may not be optimal due to an inadequate volume of blood received in culture bottles Performed at [facility], [address], [place], [place] [address]    Culture   Final    NO GROWTH < 24 HOURS Performed at [facility], [address], [place], [place] [address]    Report Status PENDING  Incomplete  Culture, blood (Routine x 2)     Status: None (Preliminary result)   Collection Time: 02/04/24 11:20 AM   Specimen: BLOOD  Result Value Ref Range Status   Specimen Description   Final    BLOOD LEFT ANTECUBITAL Performed at [facility], [address], [place], [place] [address]    Special Requests   Final    BOTTLES DRAWN AEROBIC AND ANAEROBIC Blood Culture results may not be optimal due to an inadequate volume of blood received in culture bottles Performed at [facility], [address], [place], [place] [address]    Culture   Final    NO GROWTH < 24 HOURS Performed at [facility], [address], [place], [place] [address]    Report Status PENDING  Incomplete  Resp panel by RT-PCR (RSV, Flu A&B,  Covid) Anterior Nasal Swab     Status: None   Collection Time: 02/04/24 11:50 AM   Specimen: Anterior Nasal Swab  Result Value Ref Range Status   SARS Coronavirus 2 by RT PCR NEGATIVE NEGATIVE Final    Comment: (NOTE) SARS-CoV-2 target nucleic acids are NOT DETECTED.  The SARS-CoV-2 RNA is generally detectable in upper respiratory specimens during the acute phase of infection. The lowest concentration of SARS-CoV-2 viral copies this assay can detect is 138 copies/mL. A negative result does not preclude SARS-Cov-2 infection and should not be used as the sole basis for treatment or other patient management decisions. A negative result may occur with  improper specimen collection/handling, submission of specimen other than nasopharyngeal swab, presence of viral mutation(s) within the areas targeted by this assay, and inadequate number of viral copies(<138 copies/mL). A negative result must be combined with clinical observations, patient history, and epidemiological information. The expected result is Negative.  Fact Sheet for Patients:  BloggerCourse.com  Fact Sheet for Healthcare Providers:  SeriousBroker.it  This test is no t yet approved or cleared by the [place]  FDA and  has been authorized for detection and/or diagnosis of SARS-CoV-2 by FDA under an Emergency Use Authorization (EUA). This EUA will remain  in effect (meaning this test can be used) for the duration of the COVID-19 declaration under Section 564(b)(1) of the Act, 21 U.S.C.section 360bbb-3(b)(1), unless the authorization is terminated  or revoked sooner.       Influenza A by PCR NEGATIVE NEGATIVE Final   Influenza B by PCR  NEGATIVE NEGATIVE Final    Comment: (NOTE) The Xpert Xpress SARS-CoV-2/FLU/RSV plus assay is intended as an aid in the diagnosis of influenza from Nasopharyngeal swab specimens and should not be used as a sole basis for treatment. Nasal washings and aspirates are unacceptable for Xpert Xpress SARS-CoV-2/FLU/RSV testing.  Fact Sheet for Patients: BloggerCourse.com  Fact Sheet for Healthcare Providers: SeriousBroker.it  This test is not yet approved or cleared by the [place]  FDA and has been authorized for detection and/or diagnosis of SARS-CoV-2 by FDA under an Emergency Use Authorization (EUA). This EUA will remain in effect (meaning this test can be used) for the duration of the COVID-19 declaration under Section 564(b)(1) of the Act, 21 U.S.C. section 360bbb-3(b)(1), unless the authorization is terminated or revoked.     Resp Syncytial Virus by PCR NEGATIVE NEGATIVE Final    Comment: (NOTE) Fact Sheet for Patients: BloggerCourse.com  Fact Sheet for Healthcare Providers: SeriousBroker.it  This test is not yet approved or cleared by the [place]  FDA and has been authorized for detection and/or diagnosis of SARS-CoV-2 by FDA under an Emergency Use Authorization (EUA). This EUA will remain in effect (meaning this test can be used) for the duration of the COVID-19 declaration under Section 564(b)(1) of the Act, 21 U.S.C. section 360bbb-3(b)(1), unless the authorization is terminated or revoked.  Performed at [facility], [address], [place], [place] [address]     Radiology Report DG Chest Port 1 View Result Date: 02/05/2024 CLINICAL DATA:  [id number] SOB (shortness of breath) [id number] EXAM: PORTABLE CHEST - 1 VIEW COMPARISON:  February 04, 2024 FINDINGS: No focal airspace consolidation, pleural effusion, or pneumothorax. No cardiomegaly. Right  hemodialysis catheter terminates in the lower SVC. No acute fracture or destructive lesion. Multilevel thoracic osteophytosis. IMPRESSION: No significant interval change.  No pneumonia or pulmonary edema. Electronically Signed   By: Rance Burrows M.D.   On: 02/05/2024 08:27  CT SINUS WO CONTRAST Result Date: 02/04/2024 CLINICAL DATA:  73 year old male with fullness, discomfort around the right eye, sinuses. Right side inner ear pain. EXAM: CT MAXILLOFACIAL WITHOUT CONTRAST TECHNIQUE: Multidetector CT images of the paranasal sinuses were obtained using the standard protocol without intravenous contrast. RADIATION DOSE REDUCTION: This exam was performed according to the departmental dose-optimization program which includes automated exposure control, adjustment of the mA and/or kV according to patient size and/or use of iterative reconstruction technique. COMPARISON:  Brain MRI 05/05/2023. PARANASAL SINUSES: PARANASAL SINUSES Mild motion artifact. Frontal: Chronically opacified right lateral frontal sinus. Right frontoethmoidal recess mucosal thickening is in an area of bubbly opacity on the MRI last year. Left frontal sinus and frontoethmoidal recess remain well aerated. Ethmoid: Well aerated. Maxillary: Chronic left maxillary sinusitis with heterogeneous mucoperiosteal thickening, subtotal sinus opacification which appears stable from last year. Contralateral right maxillary sinus mild mucosal thickening, posterior alveolar recess small retention cysts. Sphenoid: Well aerated. Hyperplastic. Mild anterior right sphenoid sinus mucosal thickening and/or retention cysts. Right ostiomeatal unit: Patent on coronal image 33. Left ostiomeatal unit: Opacified. Nasal passages: Nasal septum is intact with rightward septal deviation and spurring. Fairly symmetric bilateral nasal cavity mucosal thickening and mild retained secretions in the inferior meatus. Anatomy: Mild motion artifact. Anterior modal artery position  suspected on coronal image 35 and 36 with pneumatization above both notches. Symmetric olfactory grooves and fovea ethmoidalis, Keros II (4-16mm). Sellar sphenoid pneumatization pattern. No clinoid process pneumatization. No Onodi cell. Other: Visible brain parenchyma appears stable from the MRI last year, negative for age. Calcified atherosclerosis at the skull base. Visualized orbits and scalp soft tissues are within normal limits. Negative visible noncontrast deep soft tissue spaces of the face. No acute maxillary dental disease, absent posterior maxillary dentition. Normal bone mineralization at the skull base. Bilateral tympanic cavities and mastoids are clear. There is bilateral external auditory canal debris or cerumen which appears greater on the right. IMPRESSION: 1. Chronic paranasal sinus disease, most pronounced in the left maxillary sinus. No significant change compared to head MRI 05/05/2023. 2. Symmetric nasal cavity mucosal thickening raising the possibility of Rhinitis. Rightward nasal septal deviation and spurring. 3. Bilateral middle ears and mastoids are clear. But there is right greater than left external auditory canal debris or cerumen,. Electronically Signed   By: Marlise Simpers M.D.   On: 02/04/2024 17:03   DG Chest Portable 1 View Result Date: 02/04/2024 CLINICAL DATA:  Fever EXAM: PORTABLE CHEST 1 VIEW COMPARISON:  January 16, 2024 FINDINGS: The heart size and mediastinal contours are within normal limits. Both lungs are clear. The visualized skeletal structures are unremarkable. January 16, 2024 no change right IJ dialysis catheter IMPRESSION: No active disease. Electronically Signed   By: Fredrich Jefferson M.D.   On: 02/04/2024 13:14     Signature  -   Lynnwood Sauer M.D on 02/05/2024 at 9:04 AM   -  To page go to [email or web address]

## 2024-02-05 NOTE — Progress Notes (Addendum)
   02/05/24 1740  Vitals  Pulse Rate (!) 58  Resp 19  SpO2 93 %  Post Treatment  Dialyzer Clearance Lightly streaked  Hemodialysis Intake (mL) 0 mL  Liters Processed 51.5  Fluid Removed (mL) 2200 mL  Tolerated HD Treatment Yes  Post-Hemodialysis Comments tx terminated 15 mins early due to pt becoming very hostile refusing bps monitor refused to wear mask for [facility] access educated on risks pt states "i know and im not doing any of that" tx terminated   Received patient in bed to unit.  Alert and oriented.  Informed consent signed and in chart. Unable to do post assessment or provide full post care due to pt refusal  TX duration: 2hrs  Patient tolerated well.  Transported back to the room  Alert, without acute distress.  Hand-off given to patient's nurse.   Access used: [facility] Access issues: none  Total UF removed: 2.2L Medication(s) given: vanc   [facility]

## 2024-02-05 NOTE — Progress Notes (Signed)
 [facility] Progress Note   Subjective: Seen in room. Discussed need to remove TDC and have line holiday D/T infection. He is agreeable to do so.   Objective Vitals:   02/04/24 2015 02/04/24 2030 02/04/24 2248 02/05/24 0343  BP:  (!) 105/51 93/67 (!) 94/54  Pulse: 81 85 85 74  Resp: 15 17 (!) 24 16  Temp:   97.9 F (36.6 C) 98.4 F (36.9 C)  TempSrc:   Oral Oral  SpO2: 98% 97% 100% 95%  Weight:      Height:       General:  adult male in stretcher in NAD   HEENT: NCAT Eyes: EOMI sclera anicteric Neck: supple trachea midline  Heart: S1S2 rub Lungs: clear and normal work of breathing at rest; on room air  Abdomen: soft/nt/nd Extremities: no edema appreciated; no cyanosis or clubbing  Skin: no rash on extremities exposed Neuro: alert and oriented x 3 provides hx and follows commands  Psych: no anxiety or agitation  Extremities - RIJ tunneled dialysis catheter  Additional Objective Labs: Basic Metabolic Panel: Recent Labs  Lab 02/04/24 1120 02/05/24 0742  NA 136 138  K 3.3* 4.0  CL 101 101  CO2 22 23  GLUCOSE 108* 97  BUN 50* 56*  CREATININE 5.66* 7.27*  CALCIUM  9.4 9.1  PHOS  --  3.7   Liver Function Tests: Recent Labs  Lab 02/04/24 1120  AST 18  ALT 10  ALKPHOS 66  BILITOT 0.6  PROT 7.5  ALBUMIN  4.0   Recent Labs  Lab 02/04/24 1128  LIPASE 99*   CBC: Recent Labs  Lab 02/04/24 1120 02/05/24 0742  WBC 10.8* 9.3  NEUTROABS 9.1* 5.9  HGB 11.7* 10.4*  HCT 35.5* 31.2*  MCV 90.1 90.2  PLT 241 237   Blood Culture    Component Value Date/Time   SDES  02/04/2024 1120    BLOOD RIGHT ANTECUBITAL Performed at [facility], [address], [place], [place] [address]    SDES  02/04/2024 1120    BLOOD LEFT ANTECUBITAL Performed at [facility] [place], [address] [place], [place] [address]    SPECREQUEST  02/04/2024 1120    BOTTLES DRAWN AEROBIC ONLY Blood Culture results may not be optimal due  to an inadequate volume of blood received in culture bottles Performed at [facility], [address] [place], [place] [address]    SPECREQUEST  02/04/2024 1120    BOTTLES DRAWN AEROBIC AND ANAEROBIC Blood Culture results may not be optimal due to an inadequate volume of blood received in culture bottles Performed at [facility], [address], [place], [place] [address]    CULT  02/04/2024 1120    NO GROWTH < 24 HOURS Performed at [facility], [address] [place], [place] [address]    CULT  02/04/2024 1120    NO GROWTH < 24 HOURS Performed at [facility], [address], [place], [place] [address]    REPTSTATUS PENDING 02/04/2024 1120   REPTSTATUS PENDING 02/04/2024 1120    Cardiac Enzymes: No results for input(s): "CKTOTAL", "CKMB", "CKMBINDEX", "TROPONINI" in the last 168 hours. CBG: No results for input(s): "GLUCAP" in the last 168 hours. Iron  Studies: No results for input(s): "IRON ", "TIBC", "TRANSFERRIN", "FERRITIN" in the last 72 hours. @lablastinr3 @ Studies/Results: DG Chest Port 1 View Result Date: 02/05/2024 CLINICAL DATA:  [id number] SOB (shortness of breath) [id number] EXAM: PORTABLE CHEST - 1 VIEW COMPARISON:  February 04, 2024 FINDINGS: No focal airspace consolidation, pleural effusion, or pneumothorax. No cardiomegaly. Right hemodialysis catheter terminates in the lower SVC. No acute fracture or destructive lesion. Multilevel thoracic osteophytosis. IMPRESSION: No significant interval change.  No pneumonia or pulmonary edema. Electronically Signed   By: Rance Burrows M.D.   On: 02/05/2024 08:27   CT SINUS WO CONTRAST Result Date: 02/04/2024 CLINICAL DATA:  73 year old male with fullness, discomfort around the right eye, sinuses. Right side inner ear pain. EXAM: CT MAXILLOFACIAL WITHOUT CONTRAST TECHNIQUE: Multidetector CT images of the paranasal sinuses were obtained using the standard protocol without intravenous  contrast. RADIATION DOSE REDUCTION: This exam was performed according to the departmental dose-optimization program which includes automated exposure control, adjustment of the mA and/or kV according to patient size and/or use of iterative reconstruction technique. COMPARISON:  Brain MRI 05/05/2023. PARANASAL SINUSES: PARANASAL SINUSES Mild motion artifact. Frontal: Chronically opacified right lateral frontal sinus. Right frontoethmoidal recess mucosal thickening is in an area of bubbly opacity on the MRI last year. Left frontal sinus and frontoethmoidal recess remain well aerated. Ethmoid: Well aerated. Maxillary: Chronic left maxillary sinusitis with heterogeneous mucoperiosteal thickening, subtotal sinus opacification which appears stable from last year. Contralateral right maxillary sinus mild mucosal thickening, posterior alveolar recess small retention cysts. Sphenoid: Well aerated. Hyperplastic. Mild anterior right sphenoid sinus mucosal thickening and/or retention cysts. Right ostiomeatal unit: Patent on coronal image 33. Left ostiomeatal unit: Opacified. Nasal passages: Nasal septum is intact with rightward septal deviation and spurring. Fairly symmetric bilateral nasal cavity mucosal thickening and mild retained secretions in the inferior meatus. Anatomy: Mild motion artifact. Anterior modal artery position suspected on coronal image 35 and 36 with pneumatization above both notches. Symmetric olfactory grooves and fovea ethmoidalis, Keros II (4-22mm). Sellar sphenoid pneumatization pattern. No clinoid process pneumatization. No Onodi cell. Other: Visible brain parenchyma appears stable from the MRI last year, negative for age. Calcified atherosclerosis at the skull base. Visualized orbits and scalp soft tissues are within normal limits. Negative visible noncontrast deep soft tissue spaces of the face. No acute maxillary dental disease, absent posterior maxillary dentition. Normal bone mineralization at the  skull base. Bilateral tympanic cavities and mastoids are clear. There is bilateral external auditory canal debris or cerumen which appears greater on the right. IMPRESSION: 1. Chronic paranasal sinus disease, most pronounced in the left maxillary sinus. No significant change compared to head MRI 05/05/2023. 2. Symmetric nasal cavity mucosal thickening raising the possibility of Rhinitis. Rightward nasal septal deviation and spurring. 3. Bilateral middle ears and mastoids are clear. But there is right greater than left external auditory canal debris or cerumen,. Electronically Signed   By: Marlise Simpers M.D.   On: 02/04/2024 17:03   DG Chest Portable 1 View Result Date: 02/04/2024 CLINICAL DATA:  Fever EXAM: PORTABLE CHEST 1 VIEW COMPARISON:  January 16, 2024 FINDINGS: The heart size and mediastinal contours are within normal limits. Both lungs are clear. The visualized skeletal structures are unremarkable. January 16, 2024 no change right IJ dialysis catheter IMPRESSION: No active disease. Electronically Signed   By: Fredrich Jefferson M.D.   On: 02/04/2024 13:14   Medications:  ceFEPime  (MAXIPIME ) IV     [START ON 02/06/2024] vancomycin       [START ON 02/06/2024] calcitRIOL   1.25 mcg Oral Q T,Th,Sa-HD   [START ON 02/06/2024] cinacalcet   30 mg Oral Q T,Th,Sa-HD   heparin   5,000 Units Subcutaneous Q8H   lipase/protease/amylase  72,000 Units Oral TID AC   loratadine   10  mg Oral Daily   sevelamer  carbonate  1,600 mg Oral BID WC     Outpatient HD orders:  [facility]  TTS  4 hours  BF 400  DF auto 1.5  EDW 94.2 kg  2K /2.5 Ca Tunneled catheter Meds: mircera 75 mcg every 4 weeks (last dose on 01/12/24), venofer  50 mg weekly, sensipar  30 mg three times a week, and calcitriol  1.25 mcg three times a week      Assessment/Plan:   # ESRD  - on HD TTS.  He had most of an HD session the date of presentation.  Next HD treatment would be 4/26     # Sepsis  - antibiotics per primary team  - blood cultures were  sent  - given that he has a catheter, he is at risk of bacteremia.  He has refused permanent access and continues to do so after our discussion today. He is agreeable to having TDC removed for line holiday. IR consulted. Needs to be schedule around HD.     # HTN   -Acceptable control on current regimen    # Anemia of CKD  - Hb acceptable    # Metabolic bone disease  - resume sensipar  and calcitriol    - check phos in AM  - on renvela  1600 mg BID per most recent discharge summary    Sedona Wenk H. Jessah Danser NP-C 02/05/2024, 9:49 AM  [employer] [phone]

## 2024-02-06 ENCOUNTER — Encounter (HOSPITAL_COMMUNITY): Payer: Self-pay | Admitting: Internal Medicine

## 2024-02-06 DIAGNOSIS — A419 Sepsis, unspecified organism: Secondary | ICD-10-CM | POA: Diagnosis not present

## 2024-02-06 DIAGNOSIS — R6520 Severe sepsis without septic shock: Secondary | ICD-10-CM | POA: Diagnosis not present

## 2024-02-06 LAB — HEPATITIS B SURFACE ANTIBODY, QUANTITATIVE: Hep B S AB Quant (Post): 176 m[IU]/mL

## 2024-02-06 MED ORDER — ALPRAZOLAM 0.5 MG PO TABS
0.5000 mg | ORAL_TABLET | Freq: Once | ORAL | Status: AC
  Administered 2024-02-06: 0.5 mg via ORAL
  Filled 2024-02-06: qty 1

## 2024-02-06 MED ORDER — PANTOPRAZOLE SODIUM 40 MG PO TBEC: 40.0000 mg | DELAYED_RELEASE_TABLET | Freq: Two times a day (BID) | ORAL | Status: DC

## 2024-02-06 MED ORDER — CEFAZOLIN SODIUM-DEXTROSE 2-4 GM/100ML-% IV SOLN: 2.0000 g | INTRAVENOUS | Status: DC

## 2024-02-06 MED ORDER — SERTRALINE HCL 100 MG PO TABS: 100.0000 mg | ORAL_TABLET | Freq: Every day | ORAL | Status: DC

## 2024-02-06 MED ORDER — ALPRAZOLAM ER 0.5 MG PO TB24
0.5000 mg | ORAL_TABLET | Freq: Once | ORAL | Status: DC
  Filled 2024-02-06: qty 1

## 2024-02-06 MED ORDER — FERROUS SULFATE 325 (65 FE) MG PO TABS
325.0000 mg | ORAL_TABLET | Freq: Every day | ORAL | Status: DC
  Administered 2024-02-07 – 2024-02-10 (×4): 325 mg via ORAL
  Filled 2024-02-06 (×4): qty 1

## 2024-02-06 MED ORDER — PANCRELIPASE (LIP-PROT-AMYL) 36000-114000 UNITS PO CPEP: 72000.0000 [IU] | ORAL_CAPSULE | Freq: Four times a day (QID) | ORAL | Status: DC | PRN

## 2024-02-06 MED ORDER — MIRTAZAPINE 15 MG PO TABS
15.0000 mg | ORAL_TABLET | Freq: Every day | ORAL | Status: DC
  Administered 2024-02-06: 15 mg via ORAL
  Filled 2024-02-06: qty 1

## 2024-02-06 MED ORDER — SERTRALINE HCL 100 MG PO TABS
100.0000 mg | ORAL_TABLET | Freq: Two times a day (BID) | ORAL | Status: DC
  Administered 2024-02-06 – 2024-02-07 (×3): 100 mg via ORAL
  Filled 2024-02-06 (×3): qty 1

## 2024-02-06 MED ORDER — ACETAMINOPHEN 500 MG PO TABS
500.0000 mg | ORAL_TABLET | Freq: Four times a day (QID) | ORAL | Status: DC | PRN
  Administered 2024-02-09: 650 mg via ORAL

## 2024-02-06 MED ORDER — PANTOPRAZOLE SODIUM 40 MG PO TBEC
40.0000 mg | DELAYED_RELEASE_TABLET | Freq: Two times a day (BID) | ORAL | Status: DC
  Administered 2024-02-06 – 2024-02-10 (×8): 40 mg via ORAL
  Filled 2024-02-06 (×8): qty 1

## 2024-02-06 MED ORDER — PANCRELIPASE (LIP-PROT-AMYL) 36000-114000 UNITS PO CPEP
36000.0000 [IU] | ORAL_CAPSULE | Freq: Four times a day (QID) | ORAL | Status: DC | PRN
Start: 2024-02-06 — End: 2024-02-10
  Administered 2024-02-06: 36000 [IU] via ORAL
  Filled 2024-02-06 (×2): qty 1

## 2024-02-06 MED ORDER — QUETIAPINE FUMARATE ER 300 MG PO TB24
300.0000 mg | ORAL_TABLET | Freq: Every day | ORAL | Status: DC
  Administered 2024-02-06 – 2024-02-09 (×4): 300 mg via ORAL
  Filled 2024-02-06 (×5): qty 1

## 2024-02-06 MED ORDER — MIRTAZAPINE 15 MG PO TABS
15.0000 mg | ORAL_TABLET | Freq: Every day | ORAL | Status: DC
  Administered 2024-02-07 – 2024-02-09 (×3): 15 mg via ORAL
  Filled 2024-02-06 (×3): qty 1

## 2024-02-06 MED ORDER — MONTELUKAST SODIUM 10 MG PO TABS
10.0000 mg | ORAL_TABLET | Freq: Every day | ORAL | Status: DC
  Administered 2024-02-06 – 2024-02-10 (×5): 10 mg via ORAL
  Filled 2024-02-06 (×5): qty 1

## 2024-02-06 MED ORDER — HYDRALAZINE HCL 20 MG/ML IJ SOLN: 10.0000 mg | Freq: Four times a day (QID) | INTRAMUSCULAR | Status: DC | PRN

## 2024-02-06 MED ORDER — FERROUS SULFATE 325 (65 FE) MG PO TBEC
325.0000 mg | DELAYED_RELEASE_TABLET | Freq: Every day | ORAL | Status: DC
  Filled 2024-02-06: qty 1

## 2024-02-06 NOTE — Progress Notes (Signed)
 PROGRESS NOTE                                                                                                                                                                                                             Patient Demographics:    Manuel Ortega, is a 73 y.o. male, DOB - 1951/09/30, ZOX:[id number]  Outpatient Primary MD for the patient is Patient, No Pcp Per    LOS - 2  Admit date - 02/04/2024    Chief Complaint  Patient presents with   Chills   Fever       Brief Narrative (HPI from H&P)    73 y.o. male with medical history significant for glaucoma, hypertension, prostate cancer, ESRD on Tuesday Thursday Saturday dialysis being admitted to the hospital with concern for sepsis of unclear etiology.  He was recently hospitalized at [facility] earlier this month, for sepsis felt to be due to possible pyelonephritis.  He has been doing well, tolerating his dialysis, however yesterday he started feeling generally unwell, weak, vague nausea.  Today he went to dialysis, was feeling very very cold and chilled, says that he normally does feel cold and dialysis center, but when he comes out of there he really feels better.  Today he did not.  He cut his dialysis session short by 1 hour since he was not feeling well.   He was admitted for fever during HD x 2 in the last few weeks, mild leukocytosis, no clear source of infection.   Subjective:   Patient in bed, appears comfortable, denies any headache, no fever, no chest pain or pressure, no shortness of breath , no abdominal pain. No new focal weakness.  Note he is upset about the HD staff which he attended him on 02/05/2024.   Assessment  & Plan :    Sepsis-meeting criteria with tachycardia, leukocytosis, possibly due to acute infection given shaking chills.  Lactic acid is elevated to 2.9.  Etiology of his infection is unclear at the time.  He would be at risk for  line infection, recurrent UTI, bacteremia.  Right-sided ear fullness with right frontal sinus fullness.  Likely seasonal allergies and blocked eustachian tube, does have some cerumen on exam in the right ear canal.  No tenderness on palpation on the right mastoid, right frontal and maxillary area, no tenderness  on manipulation of his right pinna.  Chest and abdominal exam benign, no skin rashes or bruises no joint pains or aches.  Of note patient has had a right subclavian HD catheter for 3 years, he has had 2 episodes of rigors, fevers with HD treatments in the last few weeks.  Suspicious for possible infection of his HD catheter, initially he was resistant to catheter removal, has had the catheter for 3 years.  Now agreeable for catheter removal, nephrology, ID and IR on board.    ESRD-on TTS schedule, nephrology on board.   Pancreatic insufficiency-continue Creon    Depression/anxiety-his home medications will be resumed once reconciled  HX of lung nodule.  Will require outpatient pulmonary follow-up within a month of discharge.       Condition - Fair  Family Communication  :  None  Code Status :  Full  Consults  :  ID, renal, IR  PUD Prophylaxis :     Procedures  :     CT   - 1. Chronic paranasal sinus disease, most pronounced in the left maxillary sinus. No significant change compared to head MRI 05/05/2023. 2. Symmetric nasal cavity mucosal thickening raising the possibility of Rhinitis. Rightward nasal septal deviation and spurring. 3. Bilateral middle ears and mastoids are clear. But there is right greater than left external auditory canal debris or cerumen,.      Disposition Plan  :    Status is: Inpatient   DVT Prophylaxis  :    heparin  injection 5,000 Units Start: 02/04/24 1445    Lab Results  Component Value Date   PLT 237 02/05/2024    Diet :  Diet Order             Diet regular Room service appropriate? Yes; Fluid consistency: Thin  Diet effective now                     Inpatient Medications  Scheduled Meds:  bimatoprost   1 drop Both Eyes QHS   calcitRIOL   1.25 mcg Oral Q T,Th,Sa-HD   Chlorhexidine  Gluconate Cloth  6 each Topical Q0600   cinacalcet   30 mg Oral Q T,Th,Sa-HD   heparin   5,000 Units Subcutaneous Q8H   lipase/protease/amylase  72,000 Units Oral TID AC   loratadine   10 mg Oral Daily   sevelamer  carbonate  1,600 mg Oral BID WC   timolol   1 drop Both Eyes q morning   Continuous Infusions:  ceFEPime  (MAXIPIME ) IV 1 g (02/05/24 1838)   [START ON 02/09/2024] vancomycin      PRN Meds:.acetaminophen  **OR** acetaminophen , albuterol , iohexol , lipase/protease/amylase, pantoprazole     Objective:   Vitals:   02/05/24 1821 02/05/24 2005 02/05/24 2339 02/06/24 0354  BP:  125/79 117/67 132/80  Pulse:  87 88 87  Resp:  11 14 17   Temp: 98.2 F (36.8 C) 98.3 F (36.8 C) 98.7 F (37.1 C) 98.2 F (36.8 C)  TempSrc: Oral Oral Oral Oral  SpO2:  98% 96% 96%  Weight:    92.9 kg  Height:        Wt Readings from Last 3 Encounters:  02/06/24 92.9 kg  01/22/24 96.6 kg  01/16/24 90.3 kg     Intake/Output Summary (Last 24 hours) at 02/06/2024 0907 Last data filed at 02/05/2024 1740 Gross per 24 hour  Intake --  Output 2200 ml  Net -2200 ml     Physical Exam  Awake Alert, No new F.N deficits, Normal affect [place].AT,PERRAL Supple Neck, No JVD,  Symmetrical Chest wall movement, Good air movement bilaterally, CTAB, right chest wall HD catheter site appears clean RRR,No Gallops,Rubs or new Murmurs,  +ve B.Sounds, Abd Soft, No tenderness,   No Cyanosis, Clubbing or edema         Data Review:    Recent Labs  Lab 02/04/24 1120 02/05/24 0742  WBC 10.8* 9.3  HGB 11.7* 10.4*  HCT 35.5* 31.2*  PLT 241 237  MCV 90.1 90.2  MCH 29.7 30.1  MCHC 33.0 33.3  RDW 14.4 14.6  LYMPHSABS 0.9 1.7  MONOABS 0.8 1.5*  EOSABS 0.0 0.1  BASOSABS 0.0 0.0    Recent Labs  Lab 02/04/24 1120 02/04/24 1127 02/04/24 1723  02/05/24 0742  NA 136  --   --  138  K 3.3*  --   --  4.0  CL 101  --   --  101  CO2 22  --   --  23  ANIONGAP 13  --   --  14  GLUCOSE 108*  --   --  97  BUN 50*  --   --  56*  CREATININE 5.66*  --   --  7.27*  AST 18  --   --   --   ALT 10  --   --   --   ALKPHOS 66  --   --   --   BILITOT 0.6  --   --   --   ALBUMIN  4.0  --   --   --   CRP  --   --   --  10.6*  PROCALCITON  --   --   --  9.29  LATICACIDVEN  --  2.9* 0.9  --   INR 1.0  --   --   --   PHOS  --   --   --  3.7  CALCIUM  9.4  --   --  9.1      Recent Labs  Lab 02/04/24 1120 02/04/24 1127 02/04/24 1723 02/05/24 0742  CRP  --   --   --  10.6*  PROCALCITON  --   --   --  9.29  LATICACIDVEN  --  2.9* 0.9  --   INR 1.0  --   --   --   CALCIUM  9.4  --   --  9.1    --------------------------------------------------------------------------------------------------------------- Lab Results  Component Value Date   CHOL 140 05/14/2023   HDL 45.00 05/14/2023   LDLCALC 72 05/14/2023   TRIG 115.0 05/14/2023   CHOLHDL 3 05/14/2023    Lab Results  Component Value Date   HGBA1C 5.6 05/14/2023      Micro Results Recent Results (from the past 240 hours)  Culture, blood (Routine x 2)     Status: None (Preliminary result)   Collection Time: 02/04/24 11:20 AM   Specimen: BLOOD  Result Value Ref Range Status   Specimen Description   Final    BLOOD RIGHT ANTECUBITAL Performed at [facility], [address] [place], [place] [address]    Special Requests   Final    BOTTLES DRAWN AEROBIC ONLY Blood Culture results may not be optimal due to an inadequate volume of blood received in culture bottles Performed at [facility], [address], [place], [place] [address]    Culture   Final    NO GROWTH 2 DAYS Performed at [facility], [address], [place], [place] [address]    Report Status  PENDING  Incomplete  Culture, blood (Routine x 2)     Status: None  (Preliminary result)   Collection Time: 02/04/24 11:20 AM   Specimen: BLOOD  Result Value Ref Range Status   Specimen Description   Final    BLOOD LEFT ANTECUBITAL Performed at [facility], [address], [place], [place] [address]    Special Requests   Final    BOTTLES DRAWN AEROBIC AND ANAEROBIC Blood Culture results may not be optimal due to an inadequate volume of blood received in culture bottles Performed at [facility], [address], [place], [place] [address]    Culture   Final    NO GROWTH 2 DAYS Performed at [facility], [address], [place], [place] [address]    Report Status PENDING  Incomplete  Resp panel by RT-PCR (RSV, Flu A&B, Covid) Anterior Nasal Swab     Status: None   Collection Time: 02/04/24 11:50 AM   Specimen: Anterior Nasal Swab  Result Value Ref Range Status   SARS Coronavirus 2 by RT PCR NEGATIVE NEGATIVE Final    Comment: (NOTE) SARS-CoV-2 target nucleic acids are NOT DETECTED.  The SARS-CoV-2 RNA is generally detectable in upper respiratory specimens during the acute phase of infection. The lowest concentration of SARS-CoV-2 viral copies this assay can detect is 138 copies/mL. A negative result does not preclude SARS-Cov-2 infection and should not be used as the sole basis for treatment or other patient management decisions. A negative result may occur with  improper specimen collection/handling, submission of specimen other than nasopharyngeal swab, presence of viral mutation(s) within the areas targeted by this assay, and inadequate number of viral copies(<138 copies/mL). A negative result must be combined with clinical observations, patient history, and epidemiological information. The expected result is Negative.  Fact Sheet for Patients:  BloggerCourse.com  Fact Sheet for Healthcare Providers:  SeriousBroker.it  This test is no t yet  approved or cleared by the [place]  FDA and  has been authorized for detection and/or diagnosis of SARS-CoV-2 by FDA under an Emergency Use Authorization (EUA). This EUA will remain  in effect (meaning this test can be used) for the duration of the COVID-19 declaration under Section 564(b)(1) of the Act, 21 U.S.C.section 360bbb-3(b)(1), unless the authorization is terminated  or revoked sooner.       Influenza A by PCR NEGATIVE NEGATIVE Final   Influenza B by PCR NEGATIVE NEGATIVE Final    Comment: (NOTE) The Xpert Xpress SARS-CoV-2/FLU/RSV plus assay is intended as an aid in the diagnosis of influenza from Nasopharyngeal swab specimens and should not be used as a sole basis for treatment. Nasal washings and aspirates are unacceptable for Xpert Xpress SARS-CoV-2/FLU/RSV testing.  Fact Sheet for Patients: BloggerCourse.com  Fact Sheet for Healthcare Providers: SeriousBroker.it  This test is not yet approved or cleared by the [place]  FDA and has been authorized for detection and/or diagnosis of SARS-CoV-2 by FDA under an Emergency Use Authorization (EUA). This EUA will remain in effect (meaning this test can be used) for the duration of the COVID-19 declaration under Section 564(b)(1) of the Act, 21 U.S.C. section 360bbb-3(b)(1), unless the authorization is terminated or revoked.     Resp Syncytial Virus by PCR NEGATIVE NEGATIVE Final    Comment: (NOTE) Fact Sheet for Patients: BloggerCourse.com  Fact Sheet for Healthcare Providers: SeriousBroker.it  This test is not yet approved or cleared by the [place]  FDA and has been authorized for detection and/or  diagnosis of SARS-CoV-2 by FDA under an Emergency Use Authorization (EUA). This EUA will remain in effect (meaning this test can be used) for the duration of the COVID-19 declaration under Section 564(b)(1) of  the Act, 21 U.S.C. section 360bbb-3(b)(1), unless the authorization is terminated or revoked.  Performed at [facility], [address] [place], [place] [address]   Resp panel by RT-PCR (RSV, Flu A&B, Covid) Anterior Nasal Swab     Status: None   Collection Time: 02/05/24  7:19 AM   Specimen: Anterior Nasal Swab  Result Value Ref Range Status   SARS Coronavirus 2 by RT PCR NEGATIVE NEGATIVE Final   Influenza A by PCR NEGATIVE NEGATIVE Final   Influenza B by PCR NEGATIVE NEGATIVE Final    Comment: (NOTE) The Xpert Xpress SARS-CoV-2/FLU/RSV plus assay is intended as an aid in the diagnosis of influenza from Nasopharyngeal swab specimens and should not be used as a sole basis for treatment. Nasal washings and aspirates are unacceptable for Xpert Xpress SARS-CoV-2/FLU/RSV testing.  Fact Sheet for Patients: BloggerCourse.com  Fact Sheet for Healthcare Providers: SeriousBroker.it  This test is not yet approved or cleared by the [place]  FDA and has been authorized for detection and/or diagnosis of SARS-CoV-2 by FDA under an Emergency Use Authorization (EUA). This EUA will remain in effect (meaning this test can be used) for the duration of the COVID-19 declaration under Section 564(b)(1) of the Act, 21 U.S.C. section 360bbb-3(b)(1), unless the authorization is terminated or revoked.     Resp Syncytial Virus by PCR NEGATIVE NEGATIVE Final    Comment: (NOTE) Fact Sheet for Patients: BloggerCourse.com  Fact Sheet for Healthcare Providers: SeriousBroker.it  This test is not yet approved or cleared by the [place]  FDA and has been authorized for detection and/or diagnosis of SARS-CoV-2 by FDA under an Emergency Use Authorization (EUA). This EUA will remain in effect (meaning this test can be used) for the duration of the COVID-19 declaration under  Section 564(b)(1) of the Act, 21 U.S.C. section 360bbb-3(b)(1), unless the authorization is terminated or revoked.  Performed at [facility], [address], [place], [place] [address]   MRSA Next Gen by PCR, Nasal     Status: None   Collection Time: 02/05/24  7:37 AM   Specimen: Anterior Nasal Swab  Result Value Ref Range Status   MRSA by PCR Next Gen NOT DETECTED NOT DETECTED Final    Comment: (NOTE) The GeneXpert MRSA Assay (FDA approved for NASAL specimens only), is one component of a comprehensive MRSA colonization surveillance program. It is not intended to diagnose MRSA infection nor to guide or monitor treatment for MRSA infections. Test performance is not FDA approved in patients less than 86 years old. Performed at [facility], [address] [place], [place] [address]     Radiology Report DG Chest Port 1 View Result Date: 02/05/2024 CLINICAL DATA:  [id number] SOB (shortness of breath) [id number] EXAM: PORTABLE CHEST - 1 VIEW COMPARISON:  February 04, 2024 FINDINGS: No focal airspace consolidation, pleural effusion, or pneumothorax. No cardiomegaly. Right hemodialysis catheter terminates in the lower SVC. No acute fracture or destructive lesion. Multilevel thoracic osteophytosis. IMPRESSION: No significant interval change.  No pneumonia or pulmonary edema. Electronically Signed   By: Rance Burrows M.D.   On: 02/05/2024 08:27   CT SINUS WO CONTRAST Result Date: 02/04/2024 CLINICAL DATA:  73 year old male with fullness, discomfort around the right eye, sinuses. Right side inner ear pain. EXAM: CT  MAXILLOFACIAL WITHOUT CONTRAST TECHNIQUE: Multidetector CT images of the paranasal sinuses were obtained using the standard protocol without intravenous contrast. RADIATION DOSE REDUCTION: This exam was performed according to the departmental dose-optimization program which includes automated exposure control, adjustment of the mA and/or kV according to patient size and/or use of  iterative reconstruction technique. COMPARISON:  Brain MRI 05/05/2023. PARANASAL SINUSES: PARANASAL SINUSES Mild motion artifact. Frontal: Chronically opacified right lateral frontal sinus. Right frontoethmoidal recess mucosal thickening is in an area of bubbly opacity on the MRI last year. Left frontal sinus and frontoethmoidal recess remain well aerated. Ethmoid: Well aerated. Maxillary: Chronic left maxillary sinusitis with heterogeneous mucoperiosteal thickening, subtotal sinus opacification which appears stable from last year. Contralateral right maxillary sinus mild mucosal thickening, posterior alveolar recess small retention cysts. Sphenoid: Well aerated. Hyperplastic. Mild anterior right sphenoid sinus mucosal thickening and/or retention cysts. Right ostiomeatal unit: Patent on coronal image 33. Left ostiomeatal unit: Opacified. Nasal passages: Nasal septum is intact with rightward septal deviation and spurring. Fairly symmetric bilateral nasal cavity mucosal thickening and mild retained secretions in the inferior meatus. Anatomy: Mild motion artifact. Anterior modal artery position suspected on coronal image 35 and 36 with pneumatization above both notches. Symmetric olfactory grooves and fovea ethmoidalis, Keros II (4-19mm). Sellar sphenoid pneumatization pattern. No clinoid process pneumatization. No Onodi cell. Other: Visible brain parenchyma appears stable from the MRI last year, negative for age. Calcified atherosclerosis at the skull base. Visualized orbits and scalp soft tissues are within normal limits. Negative visible noncontrast deep soft tissue spaces of the face. No acute maxillary dental disease, absent posterior maxillary dentition. Normal bone mineralization at the skull base. Bilateral tympanic cavities and mastoids are clear. There is bilateral external auditory canal debris or cerumen which appears greater on the right. IMPRESSION: 1. Chronic paranasal sinus disease, most pronounced in  the left maxillary sinus. No significant change compared to head MRI 05/05/2023. 2. Symmetric nasal cavity mucosal thickening raising the possibility of Rhinitis. Rightward nasal septal deviation and spurring. 3. Bilateral middle ears and mastoids are clear. But there is right greater than left external auditory canal debris or cerumen,. Electronically Signed   By: Marlise Simpers M.D.   On: 02/04/2024 17:03   DG Chest Portable 1 View Result Date: 02/04/2024 CLINICAL DATA:  Fever EXAM: PORTABLE CHEST 1 VIEW COMPARISON:  January 16, 2024 FINDINGS: The heart size and mediastinal contours are within normal limits. Both lungs are clear. The visualized skeletal structures are unremarkable. January 16, 2024 no change right IJ dialysis catheter IMPRESSION: No active disease. Electronically Signed   By: Fredrich Jefferson M.D.   On: 02/04/2024 13:14     Signature  -   Lynnwood Sauer M.D on 02/06/2024 at 9:07 AM   -  To page go to [email or web address]

## 2024-02-06 NOTE — Consult Note (Signed)
 Chief Complaint: Patient was seen in consultation today for sepsis  Referring Physician(s): Dr. Edson Graces  Supervising Physician: Art Largo  Patient Status: [facility] - In-pt  History of Present Illness: Manuel Ortega is a 73 y.o. male with past medical history of HTN, prostate cancer, ESRD on HD admitted with sepsis, source unknown.  Patient produces urine at baseline however remains dialysis dependent.  He recently had his catheter exchanged in December 2024 with [facility] due to poor function.  No issues with line since placement.  Patient followed by ID due to infectious concerns.  Line holiday requested which will be performed today.  Patient will need line replacement following 48 hr holiday.   Past Medical History:  Diagnosis Date   AKI (acute kidney injury) (HCC) 05/2020   possibly due to prostate blockage   Anxiety    Arthritis    Depression    Dyspnea    Glaucoma    Hypertension    Prostate cancer [facility])    Renal disorder    Dialysis T/th/sat    Past Surgical History:  Procedure Laterality Date   BIOPSY  10/15/2023   Procedure: BIOPSY;  Surgeon: Ace Holder, MD;  Location: [facility] ENDOSCOPY;  Service: Gastroenterology;;   DIALYSIS/PERMA CATHETER INSERTION N/A 09/23/2023   Procedure: DIALYSIS/PERMA CATHETER INSERTION;  Surgeon: Patrick Boor, MD;  Location: [facility] INVASIVE CV LAB;  Service: Cardiovascular;  Laterality: N/A;   DIALYSIS/PERMA CATHETER REMOVAL N/A 09/23/2023   Procedure: DIALYSIS/PERMA CATHETER REMOVAL;  Surgeon: Patrick Boor, MD;  Location: [facility] INVASIVE CV LAB;  Service: Cardiovascular;  Laterality: N/A;   ESOPHAGOGASTRODUODENOSCOPY (EGD) WITH PROPOFOL  N/A 10/15/2023   Procedure: ESOPHAGOGASTRODUODENOSCOPY (EGD) WITH PROPOFOL ;  Surgeon: Ace Holder, MD;  Location: [facility] ENDOSCOPY;  Service: Gastroenterology;  Laterality: N/A;   growth in nostril     biopsied negatiove   IR FLUORO GUIDE CV LINE RIGHT  05/12/2020   IR FLUORO GUIDE CV LINE RIGHT   05/28/2020   IR US  GUIDE VASC ACCESS RIGHT  05/12/2020   IR US  GUIDE VASC ACCESS RIGHT  05/28/2020   TRANSURETHRAL RESECTION OF PROSTATE N/A 09/13/2020   Procedure: TRANSURETHRAL RESECTION OF THE PROSTATE (TURP) WITH CYSTOSCOPY;  Surgeon: Florencio Hunting, MD;  Location: [facility] ORS;  Service: Urology;  Laterality: N/A;    Allergies: Patient has no known allergies.  Medications: Prior to Admission medications   Medication Sig Start Date End Date Taking? Authorizing Provider  CREON  36000-114000 units CPEP capsule Take 36,000-72,000 Units by mouth See admin instructions. Take 72,000 units by mouth three time a day with meals and 36,000 units three times a day with snacks   Yes [provider]  diphenhydrAMINE  (BENADRYL ) 25 mg capsule Take 25 mg by mouth every 4 (four) hours as needed for itching or allergies.   Yes [provider]  ferrous sulfate  325 (65 FE) MG EC tablet Take 325 mg by mouth daily with breakfast.   Yes [provider]  LUMIGAN  0.01 % SOLN Place 1 drop into both eyes at bedtime. 12/21/23  Yes [provider]  mirtazapine  (REMERON ) 15 MG tablet Take 15 mg by mouth daily. 11/22/23  Yes [provider]  montelukast  (SINGULAIR ) 10 MG tablet Take 10 mg by mouth daily. 12/18/23  Yes [provider]  pantoprazole  (PROTONIX ) 40 MG tablet Take 1 tablet (40 mg total) by mouth 2 (two) times daily. 10/15/23  Yes Macdonald Savoy, MD  QUEtiapine  Fumarate (SEROQUEL  XR) 150 MG 24 hr tablet Take 300 mg by  mouth at bedtime. 11/27/23  Yes [provider]  sertraline  (ZOLOFT ) 100 MG tablet Take 100 mg by mouth in the morning and at bedtime.   Yes [provider]  sildenafil (VIAGRA) 100 MG tablet Take 100 mg by mouth as needed for erectile dysfunction. 12/17/23  Yes [provider]  timolol  (TIMOPTIC ) 0.5 % ophthalmic solution Place 1 drop into both eyes every morning. 12/18/23  Yes [provider]  TYLENOL  500 MG tablet Take  500-1,000 mg by mouth every 6 (six) hours as needed for mild pain (pain score 1-3) or headache.   Yes [provider]     Family History  Problem Relation Age of Onset   Stroke Mother    Breast cancer Neg Hx    Colon cancer Neg Hx    Prostate cancer Neg Hx    Pancreatic cancer Neg Hx     Social History   Socioeconomic History   Marital status: Divorced    Spouse name: Not on file   Number of children: Not on file   Years of education: Not on file   Highest education level: Not on file  Occupational History   Occupation: Art gallery manager  Tobacco Use   Smoking status: Never    Passive exposure: Never   Smokeless tobacco: Never  Vaping Use   Vaping status: Never Used  Substance and Sexual Activity   Alcohol use: Not Currently    Alcohol/week: 1.0 standard drink of alcohol    Types: 1 Glasses of wine per week    Comment: rare   Drug use: Not Currently   Sexual activity: Not Currently    Birth control/protection: None  Other Topics Concern   Not on file  Social History Narrative   ** Merged History Encounter **       Right handed Drinks caffeine Lives here in [place] for 6 years, came from [place] Lives alone   Social Drivers of Health   Financial Resource Strain: Low Risk  (11/10/2023)   Received from [facility]   Overall Financial Resource Strain (CARDIA)    Difficulty of Paying Living Expenses: Not hard at all  Food Insecurity: No Food Insecurity (02/05/2024)   Hunger Vital Sign    Worried About Running Out of Food in the Last Year: Never true    Ran Out of Food in the Last Year: Never true  Transportation Needs: No Transportation Needs (02/05/2024)   PRAPARE - Administrator, Civil Service (Medical): No    Lack of Transportation (Non-Medical): No  Physical Activity: Sufficiently Active (08/21/2022)   Received from [facility]   Exercise Vital Sign    Days of Exercise per Week: 4 days    Minutes of Exercise per Session: 50  min  Stress: No Stress Concern Present (02/02/2024)   Received from [facility] of Occupational [facility] - Occupational Stress Questionnaire    Feeling of Stress : Only a little  Social Connections: Moderately Isolated (02/05/2024)   Social Connection and Isolation Panel [NHANES]    Frequency of Communication with Friends and Family: More than three times a week    Frequency of Social Gatherings with Friends and Family: More than three times a week    Attends Religious Services: Never    Database administrator or Organizations: No    Attends Engineer, structural: More than 4 times per year    Marital Status: Divorced     Review of Systems:  A 12 point ROS discussed and pertinent positives are indicated in the HPI above.  All other systems are negative.  Review of Systems  Constitutional:  Positive for fatigue and fever.  Respiratory:  Negative for cough and shortness of breath.   Cardiovascular:  Negative for chest pain.  Gastrointestinal:  Negative for abdominal pain and nausea.  Musculoskeletal:  Negative for back pain.  Psychiatric/Behavioral:  Negative for behavioral problems and confusion.     Vital Signs: BP 132/80 (BP Location: Right Arm)   Pulse 87   Temp 98.2 F (36.8 C) (Oral)   Resp 17   Ht 6\' 1"  (1.854 m)   Wt 204 lb 12.9 oz (92.9 kg)   SpO2 96%   BMI 27.02 kg/m   Physical Exam Vitals and nursing note reviewed.  Constitutional:      General: He is not in acute distress.    Appearance: Normal appearance. He is not ill-appearing.  HENT:     Mouth/Throat:     Mouth: Mucous membranes are moist.     Pharynx: Oropharynx is clear.  Neck:     Comments: R internal jugular tunneled catheter in place.  Cardiovascular:     Rate and Rhythm: Normal rate and regular rhythm.  Pulmonary:     Effort: Pulmonary effort is normal. No respiratory distress.     Breath sounds: Normal breath sounds.  Abdominal:     General: Abdomen is flat. There  is no distension.     Palpations: Abdomen is soft.  Skin:    General: Skin is warm and dry.  Neurological:     General: No focal deficit present.     Mental Status: He is alert and oriented to person, place, and time. Mental status is at baseline.  Psychiatric:        Mood and Affect: Mood normal.        Behavior: Behavior normal.        Thought Content: Thought content normal.        Judgment: Judgment normal.      MD Evaluation Airway: WNL Heart: WNL Abdomen: WNL Chest/ Lungs: WNL ASA  Classification: 3 Mallampati/Airway Score: Two   Imaging: DG Chest Port 1 View Result Date: 02/05/2024 CLINICAL DATA:  141880 SOB (shortness of breath) [id number] EXAM: PORTABLE CHEST - 1 VIEW COMPARISON:  February 04, 2024 FINDINGS: No focal airspace consolidation, pleural effusion, or pneumothorax. No cardiomegaly. Right hemodialysis catheter terminates in the lower SVC. No acute fracture or destructive lesion. Multilevel thoracic osteophytosis. IMPRESSION: No significant interval change.  No pneumonia or pulmonary edema. Electronically Signed   By: Rance Burrows M.D.   On: 02/05/2024 08:27   CT SINUS WO CONTRAST Result Date: 02/04/2024 CLINICAL DATA:  73 year old male with fullness, discomfort around the right eye, sinuses. Right side inner ear pain. EXAM: CT MAXILLOFACIAL WITHOUT CONTRAST TECHNIQUE: Multidetector CT images of the paranasal sinuses were obtained using the standard protocol without intravenous contrast. RADIATION DOSE REDUCTION: This exam was performed according to the departmental dose-optimization program which includes automated exposure control, adjustment of the mA and/or kV according to patient size and/or use of iterative reconstruction technique. COMPARISON:  Brain MRI 05/05/2023. PARANASAL SINUSES: PARANASAL SINUSES Mild motion artifact. Frontal: Chronically opacified right lateral frontal sinus. Right frontoethmoidal recess mucosal thickening is in an area of bubbly opacity on  the MRI last year. Left frontal sinus and frontoethmoidal recess remain well aerated. Ethmoid: Well aerated. Maxillary: Chronic left maxillary sinusitis with heterogeneous mucoperiosteal thickening, subtotal sinus opacification which  appears stable from last year. Contralateral right maxillary sinus mild mucosal thickening, posterior alveolar recess small retention cysts. Sphenoid: Well aerated. Hyperplastic. Mild anterior right sphenoid sinus mucosal thickening and/or retention cysts. Right ostiomeatal unit: Patent on coronal image 33. Left ostiomeatal unit: Opacified. Nasal passages: Nasal septum is intact with rightward septal deviation and spurring. Fairly symmetric bilateral nasal cavity mucosal thickening and mild retained secretions in the inferior meatus. Anatomy: Mild motion artifact. Anterior modal artery position suspected on coronal image 35 and 36 with pneumatization above both notches. Symmetric olfactory grooves and fovea ethmoidalis, Keros II (4-51mm). Sellar sphenoid pneumatization pattern. No clinoid process pneumatization. No Onodi cell. Other: Visible brain parenchyma appears stable from the MRI last year, negative for age. Calcified atherosclerosis at the skull base. Visualized orbits and scalp soft tissues are within normal limits. Negative visible noncontrast deep soft tissue spaces of the face. No acute maxillary dental disease, absent posterior maxillary dentition. Normal bone mineralization at the skull base. Bilateral tympanic cavities and mastoids are clear. There is bilateral external auditory canal debris or cerumen which appears greater on the right. IMPRESSION: 1. Chronic paranasal sinus disease, most pronounced in the left maxillary sinus. No significant change compared to head MRI 05/05/2023. 2. Symmetric nasal cavity mucosal thickening raising the possibility of Rhinitis. Rightward nasal septal deviation and spurring. 3. Bilateral middle ears and mastoids are clear. But there is  right greater than left external auditory canal debris or cerumen,. Electronically Signed   By: Marlise Simpers M.D.   On: 02/04/2024 17:03   DG Chest Portable 1 View Result Date: 02/04/2024 CLINICAL DATA:  Fever EXAM: PORTABLE CHEST 1 VIEW COMPARISON:  January 16, 2024 FINDINGS: The heart size and mediastinal contours are within normal limits. Both lungs are clear. The visualized skeletal structures are unremarkable. January 16, 2024 no change right IJ dialysis catheter IMPRESSION: No active disease. Electronically Signed   By: Fredrich Jefferson M.D.   On: 02/04/2024 13:14   DG Foot Complete Left Result Date: 01/18/2024 CLINICAL DATA:  Foot pain. EXAM: LEFT FOOT - COMPLETE 3+ VIEW COMPARISON:  None Available. FINDINGS: There is no evidence of fracture or dislocation. Slight hammertoe deformity of the fifth toe. No erosive change or periostitis. Soft tissue thickening about the medial first metatarsal phalangeal joint. No soft tissue gas or radiopaque foreign body. IMPRESSION: 1. Soft tissue thickening about the medial first metatarsophalangeal joint. No acute osseous abnormality. 2. Slight hammertoe deformity of the fifth toe. Electronically Signed   By: Chadwick Colonel M.D.   On: 01/18/2024 15:10   CT CHEST ABDOMEN PELVIS W CONTRAST Result Date: 01/16/2024 CLINICAL DATA:  Sepsis EXAM: CT CHEST, ABDOMEN, AND PELVIS WITH CONTRAST TECHNIQUE: Multidetector CT imaging of the chest, abdomen and pelvis was performed following the standard protocol during bolus administration of intravenous contrast. RADIATION DOSE REDUCTION: This exam was performed according to the departmental dose-optimization program which includes automated exposure control, adjustment of the mA and/or kV according to patient size and/or use of iterative reconstruction technique. CONTRAST:  75mL OMNIPAQUE  IOHEXOL  350 MG/ML SOLN COMPARISON:  None Available. FINDINGS: CT CHEST FINDINGS Cardiovascular: Mild coronary artery calcification. Global cardiac size  within normal limits. No pericardial effusion. Central pulmonary arteries are of normal caliber. Mild atherosclerotic calcification within the thoracic aorta. No aortic aneurysm. Right internal jugular hemodialysis catheter tip noted within the superior vena cava. Mediastinum/Nodes: No enlarged mediastinal, hilar, or axillary lymph nodes. Thyroid gland, trachea, and esophagus demonstrate no significant findings. Lungs/Pleura: Stable 6 mm noncalcified pulmonary nodule  within the left lower lobe, axial image # 96/4. Stable 6 mm subpleural right lower lobe pulmonary nodule when accounting for partial atelectasis of the right lower lobe, axial image # 103/4. Lungs a otherwise re clear. No pleural effusion or pneumothorax. Musculoskeletal: No chest wall mass or suspicious bone lesions identified. CT ABDOMEN PELVIS FINDINGS Hepatobiliary: Stable indeterminate low-attenuation lesions within the a patent dome since remote prior examination of 05/11/2020 likely representing cysts given their stability over time. Liver otherwise unremarkable. Gallbladder unremarkable. No intra or extrahepatic biliary ductal dilation. Pancreas: Stable 16 mm cystic lesion within the uncinate process of the pancreas demonstrating coarse calcification possibly the sequela of remote pancreatitis or a complex cystic neoplasm. Pancreas is otherwise unremarkable. Spleen: Unremarkable Adrenals/Urinary Tract: The adrenal glands are unremarkable. The kidneys are normal in position. Mild bilateral renal cortical atrophy, unchanged. Simple cortical cyst noted within the lower pole of the left kidney for which no follow-up imaging is recommended. Stable moderate nonspecific perinephric stranding. No hydronephrosis. No intrarenal or ureteral calculi. The bladder demonstrates wall trabeculation likely related to changes of chronic bladder outlet obstruction. TURP defect noted within the central prostate gland. Stomach/Bowel: Moderate cecal and sigmoid  diverticulosis. Stomach, small bowel, and large bowel are otherwise unremarkable. Appendix normal. No evidence of obstruction or focal inflammation. No free intraperitoneal gas or fluid. Vascular/Lymphatic: Aortic atherosclerosis. No enlarged abdominal or pelvic lymph nodes. Reproductive: The prostate gland is not enlarged. TURP defect noted. Other: No abdominal wall hernia Musculoskeletal: No acute bone abnormality. No lytic or blastic bone lesion. IMPRESSION: 1. No acute intrathoracic or intra-abdominal pathology identified. No definite radiographic explanation for the patient's reported symptoms. 2. Mild coronary artery calcification. 3. Stable 6 mm noncalcified pulmonary nodules within the lower lobes bilaterally. Follow-up evaluation in 12-18 months is recommended to confirm stability and an underlying benign etiology. 4. Stable 16 mm cystic lesion within the uncinate process of the pancreas demonstrating coarse calcification possibly the sequela of remote pancreatitis or a complex cystic neoplasm. Follow-up MRI examination in 1 year is recommended to assess for stability. 5. Moderate cecal and sigmoid diverticulosis. 6. Stable moderate nonspecific perinephric stranding. Correlation with urinalysis may be helpful to exclude the possibility of pyelonephritis. Aortic Atherosclerosis (ICD10-I70.0). Electronically Signed   By: Worthy Heads M.D.   On: 01/16/2024 23:48   DG Chest Port 1 View Result Date: 01/16/2024 CLINICAL DATA:  Questionable sepsis - evaluate for abnormality Lightheaded.  Dizzy. EXAM: PORTABLE CHEST 1 VIEW COMPARISON:  None Available. FINDINGS: Right-sided dialysis catheter tip overlies the lower SVC. The heart is upper normal in size. Aortic atherosclerosis. Mild atelectasis in both lung bases. No confluent airspace disease. No pulmonary edema, large pleural effusion or evidence of pneumothorax. IMPRESSION: Mild bibasilar atelectasis. Electronically Signed   By: Chadwick Colonel M.D.   On:  01/16/2024 17:10    Labs:  CBC: Recent Labs    01/17/24 0437 01/18/24 0500 02/04/24 1120 02/05/24 0742  WBC 12.0* 7.9 10.8* 9.3  HGB 10.3* 10.4* 11.7* 10.4*  HCT 31.2* 32.0* 35.5* 31.2*  PLT 189 180 241 237    COAGS: Recent Labs    08/20/23 1026 10/09/23 1323 01/16/24 1658 02/04/24 1120  INR 0.8 0.9 1.0 1.0  APTT 26  --   --   --     BMP: Recent Labs    01/17/24 0437 01/18/24 0500 02/04/24 1120 02/05/24 0742  NA 138 138 136 138  K 3.1* 3.6 3.3* 4.0  CL 101 104 101 101  CO2 25 21* 22  23  GLUCOSE 105* 96 108* 97  BUN 19 27* 50* 56*  CALCIUM  8.6* 9.0 9.4 9.1  CREATININE 5.33* 6.83* 5.66* 7.27*  GFRNONAA 11* 8* 10* 7*    LIVER FUNCTION TESTS: Recent Labs    09/19/23 1629 10/09/23 1323 10/11/23 1024 10/14/23 0802 10/15/23 2025 01/16/24 1658 02/04/24 1120  BILITOT 0.5 0.4  --   --   --  0.8 0.6  AST 16 15  --   --   --  19 18  ALT 14 14  --   --   --  10 10  ALKPHOS 72 62  --   --   --  58 66  PROT 7.2 6.4*  --   --   --  7.0 7.5  ALBUMIN  3.9 3.4*   < > 3.0* 3.5 4.1 4.0   < > = values in this interval not displayed.    TUMOR MARKERS: No results for input(s): "AFPTM", "CEA", "CA199", "CHROMGRNA" in the last 8760 hours.  Assessment and Plan: Sepsis of unknown etiology ESRD on HD via R internal jugular tunneled catheter Patient on HD via R internal jugular catheter placed by Nephrology.  Admitted with sepsis of unknown source.  Request for removal and replacement.  Discussed with patient who is agreeable.   For removal today.   NPO p MN 4/28 for possible placement Monday.   Risks and benefits discussed with the patient including, but not limited to bleeding, infection, vascular injury, pneumothorax which may require chest tube placement, air embolism or even death  All of the patient's questions were answered, patient is agreeable to proceed. Consent signed and in chart.  Thank you for this interesting consult.  I greatly enjoyed meeting  Manuel Ortega and look forward to participating in their care.  A copy of this report was sent to the requesting provider on this date.  Electronically Signed: Eliseo Withers Sue-Ellen Jamere Stidham, PA 02/06/2024, 1:52 PM   I spent a total of 20 Minutes    in face to face in clinical consultation, greater than 50% of which was counseling/coordinating care for ESRD on HD, sepsis.

## 2024-02-06 NOTE — Plan of Care (Signed)

## 2024-02-06 NOTE — Progress Notes (Addendum)
 [facility] Progress Note   Subjective: seen in room. TDC to be removed later this AM by IR. No C/Os.      Objective Vitals:   02/05/24 1821 02/05/24 2005 02/05/24 2339 02/06/24 0354  BP:  125/79 117/67 132/80  Pulse:  87 88 87  Resp:  11 14 17   Temp: 98.2 F (36.8 C) 98.3 F (36.8 C) 98.7 F (37.1 C) 98.2 F (36.8 C)  TempSrc: Oral Oral Oral Oral  SpO2:  98% 96% 96%  Weight:    92.9 kg  Height:       General:  WN, WD male in NAD  HEENT: NCAT Heart: S1S2 rub Lungs: CTAB A/P Abdomen: NABS Extremities: No LE edema Extremities - RIJ tunneled dialysis catheter.   Additional Objective Labs: Basic Metabolic Panel: Recent Labs  Lab 02/04/24 1120 02/05/24 0742  NA 136 138  K 3.3* 4.0  CL 101 101  CO2 22 23  GLUCOSE 108* 97  BUN 50* 56*  CREATININE 5.66* 7.27*  CALCIUM  9.4 9.1  PHOS  --  3.7   Liver Function Tests: Recent Labs  Lab 02/04/24 1120  AST 18  ALT 10  ALKPHOS 66  BILITOT 0.6  PROT 7.5  ALBUMIN  4.0   Recent Labs  Lab 02/04/24 1128  LIPASE 99*   CBC: Recent Labs  Lab 02/04/24 1120 02/05/24 0742  WBC 10.8* 9.3  NEUTROABS 9.1* 5.9  HGB 11.7* 10.4*  HCT 35.5* 31.2*  MCV 90.1 90.2  PLT 241 237   Blood Culture    Component Value Date/Time   SDES  02/04/2024 1120    BLOOD RIGHT ANTECUBITAL Performed at [facility], [address] [place], [place] [address]    SDES  02/04/2024 1120    BLOOD LEFT ANTECUBITAL Performed at [facility], [address] [place], [place] [address]    SPECREQUEST  02/04/2024 1120    BOTTLES DRAWN AEROBIC ONLY Blood Culture results may not be optimal due to an inadequate volume of blood received in culture bottles Performed at [facility], [address], [place], [place] [address]    SPECREQUEST  02/04/2024 1120    BOTTLES DRAWN AEROBIC AND ANAEROBIC Blood Culture results may not be optimal due to an inadequate volume of blood  received in culture bottles Performed at [facility], [address], [place], [place] [address]    CULT  02/04/2024 1120    NO GROWTH 2 DAYS Performed at [facility], [address], [place], [place] [address]    CULT  02/04/2024 1120    NO GROWTH 2 DAYS Performed at [facility], [address], [place], [place] [address]    REPTSTATUS PENDING 02/04/2024 1120   REPTSTATUS PENDING 02/04/2024 1120    Cardiac Enzymes: No results for input(s): "CKTOTAL", "CKMB", "CKMBINDEX", "TROPONINI" in the last 168 hours. CBG: No results for input(s): "GLUCAP" in the last 168 hours. Iron  Studies: No results for input(s): "IRON ", "TIBC", "TRANSFERRIN", "FERRITIN" in the last 72 hours. @lablastinr3 @ Studies/Results: DG Chest Port 1 View Result Date: 02/05/2024 CLINICAL DATA:  141880 SOB (shortness of breath) 141880 EXAM: PORTABLE CHEST - 1 VIEW COMPARISON:  February 04, 2024 FINDINGS: No focal airspace consolidation, pleural effusion, or pneumothorax. No cardiomegaly. Right hemodialysis catheter terminates in the lower SVC. No acute fracture or destructive lesion. Multilevel thoracic osteophytosis. IMPRESSION: No significant interval change.  No pneumonia or pulmonary edema. Electronically Signed   By: Rance Burrows  M.D.   On: 02/05/2024 08:27   CT SINUS WO CONTRAST Result Date: 02/04/2024 CLINICAL DATA:  73 year old male with fullness, discomfort around the right eye, sinuses. Right side inner ear pain. EXAM: CT MAXILLOFACIAL WITHOUT CONTRAST TECHNIQUE: Multidetector CT images of the paranasal sinuses were obtained using the standard protocol without intravenous contrast. RADIATION DOSE REDUCTION: This exam was performed according to the departmental dose-optimization program which includes automated exposure control, adjustment of the mA and/or kV according to patient size and/or use of iterative reconstruction technique. COMPARISON:  Brain MRI 05/05/2023. PARANASAL  SINUSES: PARANASAL SINUSES Mild motion artifact. Frontal: Chronically opacified right lateral frontal sinus. Right frontoethmoidal recess mucosal thickening is in an area of bubbly opacity on the MRI last year. Left frontal sinus and frontoethmoidal recess remain well aerated. Ethmoid: Well aerated. Maxillary: Chronic left maxillary sinusitis with heterogeneous mucoperiosteal thickening, subtotal sinus opacification which appears stable from last year. Contralateral right maxillary sinus mild mucosal thickening, posterior alveolar recess small retention cysts. Sphenoid: Well aerated. Hyperplastic. Mild anterior right sphenoid sinus mucosal thickening and/or retention cysts. Right ostiomeatal unit: Patent on coronal image 33. Left ostiomeatal unit: Opacified. Nasal passages: Nasal septum is intact with rightward septal deviation and spurring. Fairly symmetric bilateral nasal cavity mucosal thickening and mild retained secretions in the inferior meatus. Anatomy: Mild motion artifact. Anterior modal artery position suspected on coronal image 35 and 36 with pneumatization above both notches. Symmetric olfactory grooves and fovea ethmoidalis, Keros II (4-54mm). Sellar sphenoid pneumatization pattern. No clinoid process pneumatization. No Onodi cell. Other: Visible brain parenchyma appears stable from the MRI last year, negative for age. Calcified atherosclerosis at the skull base. Visualized orbits and scalp soft tissues are within normal limits. Negative visible noncontrast deep soft tissue spaces of the face. No acute maxillary dental disease, absent posterior maxillary dentition. Normal bone mineralization at the skull base. Bilateral tympanic cavities and mastoids are clear. There is bilateral external auditory canal debris or cerumen which appears greater on the right. IMPRESSION: 1. Chronic paranasal sinus disease, most pronounced in the left maxillary sinus. No significant change compared to head MRI 05/05/2023. 2.  Symmetric nasal cavity mucosal thickening raising the possibility of Rhinitis. Rightward nasal septal deviation and spurring. 3. Bilateral middle ears and mastoids are clear. But there is right greater than left external auditory canal debris or cerumen,. Electronically Signed   By: Marlise Simpers M.D.   On: 02/04/2024 17:03   DG Chest Portable 1 View Result Date: 02/04/2024 CLINICAL DATA:  Fever EXAM: PORTABLE CHEST 1 VIEW COMPARISON:  January 16, 2024 FINDINGS: The heart size and mediastinal contours are within normal limits. Both lungs are clear. The visualized skeletal structures are unremarkable. January 16, 2024 no change right IJ dialysis catheter IMPRESSION: No active disease. Electronically Signed   By: Fredrich Jefferson M.D.   On: 02/04/2024 13:14   Medications:  ceFEPime  (MAXIPIME ) IV 1 g (02/05/24 1838)   [START ON 02/09/2024] vancomycin       ALPRAZolam  0.5 mg Oral Once   bimatoprost   1 drop Both Eyes QHS   calcitRIOL   1.25 mcg Oral Q T,Th,Sa-HD   Chlorhexidine  Gluconate Cloth  6 each Topical Q0600   cinacalcet   30 mg Oral Q T,Th,Sa-HD   [START ON 02/07/2024] ferrous sulfate   325 mg Oral Q breakfast   heparin   5,000 Units Subcutaneous Q8H   lipase/protease/amylase  72,000 Units Oral TID AC   loratadine   10 mg Oral Daily   [START ON 02/07/2024] mirtazapine   15 mg Oral QHS  montelukast   10 mg Oral Daily   pantoprazole   40 mg Oral BID   QUEtiapine  Fumarate  300 mg Oral QHS   sertraline   100 mg Oral BID   sevelamer  carbonate  1,600 mg Oral BID WC   timolol   1 drop Both Eyes q morning     Outpatient HD orders:  [facility]  TTS  4 hours  BF 400  DF auto 1.5  EDW 94.2 kg  2K /2.5 Ca Tunneled catheter Meds: mircera 75 mcg every 4 weeks (last dose on 01/12/24), venofer  50 mg weekly, sensipar  30 mg three times a week, and calcitriol  1.25 mcg three times a week      Assessment/Plan:   # ESRD  - on HD TTS. HD off schedule 02/05/2024. Line holiday starting 02/06/2024. Needs line replaced  02/09/2024.      # Sepsis  - antibiotics per primary team  - blood cultures were sent,. NGTD. Etiology unclear.  - given that he has a catheter, he is at risk of bacteremia.  He has refused permanent access and continues to do so after our discussion today. He is agreeable to having TDC removed for line holiday. IR consulted.   # HTN   -Acceptable control on current regimen    # Anemia of CKD  - Hb acceptable    # Metabolic bone disease  - resume sensipar  and calcitriol    - check phos in AM  - on renvela  1600 mg BID per most recent discharge summary  Alfred Harrel H. Hilmar Moldovan NP-C 02/06/2024, 11:42 AM  [employer] [phone]

## 2024-02-06 NOTE — Progress Notes (Signed)
 Walked into pt's room and pt started complaining about his meds. Attempted to explain about the situation and pt insisted to talk to MD. Pt started yelling and being combative. Left the room and reported to charge nurse and MD

## 2024-02-07 ENCOUNTER — Inpatient Hospital Stay (HOSPITAL_COMMUNITY)

## 2024-02-07 DIAGNOSIS — A419 Sepsis, unspecified organism: Secondary | ICD-10-CM | POA: Diagnosis not present

## 2024-02-07 DIAGNOSIS — R6520 Severe sepsis without septic shock: Secondary | ICD-10-CM | POA: Diagnosis not present

## 2024-02-07 HISTORY — PX: IR REMOVAL TUN CV CATH W/O FL: IMG2289

## 2024-02-07 LAB — CBC WITH DIFFERENTIAL/PLATELET
Abs Immature Granulocytes: 0.1 10*3/uL — ABNORMAL HIGH (ref 0.00–0.07)
Basophils Absolute: 0.1 10*3/uL (ref 0.0–0.1)
Basophils Relative: 1 %
Eosinophils Absolute: 0.2 10*3/uL (ref 0.0–0.5)
Eosinophils Relative: 2 %
HCT: 31.6 % — ABNORMAL LOW (ref 39.0–52.0)
Hemoglobin: 10.5 g/dL — ABNORMAL LOW (ref 13.0–17.0)
Immature Granulocytes: 1 %
Lymphocytes Relative: 28 %
Lymphs Abs: 2.3 10*3/uL (ref 0.7–4.0)
MCH: 29.8 pg (ref 26.0–34.0)
MCHC: 33.2 g/dL (ref 30.0–36.0)
MCV: 89.8 fL (ref 80.0–100.0)
Monocytes Absolute: 1 10*3/uL (ref 0.1–1.0)
Monocytes Relative: 12 %
Neutro Abs: 4.7 10*3/uL (ref 1.7–7.7)
Neutrophils Relative %: 56 %
Platelets: 241 10*3/uL (ref 150–400)
RBC: 3.52 MIL/uL — ABNORMAL LOW (ref 4.22–5.81)
RDW: 14.6 % (ref 11.5–15.5)
WBC: 8.3 10*3/uL (ref 4.0–10.5)
nRBC: 0 % (ref 0.0–0.2)

## 2024-02-07 LAB — PROCALCITONIN: Procalcitonin: 6.62 ng/mL

## 2024-02-07 LAB — C-REACTIVE PROTEIN: CRP: 4.6 mg/dL — ABNORMAL HIGH (ref <1.0)

## 2024-02-07 MED ORDER — SERTRALINE HCL 100 MG PO TABS
100.0000 mg | ORAL_TABLET | Freq: Once | ORAL | Status: AC
  Administered 2024-02-07: 100 mg via ORAL
  Filled 2024-02-07: qty 1

## 2024-02-07 MED ORDER — SERTRALINE HCL 100 MG PO TABS
200.0000 mg | ORAL_TABLET | Freq: Every day | ORAL | Status: DC
  Administered 2024-02-08 – 2024-02-10 (×3): 200 mg via ORAL
  Filled 2024-02-07 (×3): qty 2

## 2024-02-07 MED ORDER — CEFAZOLIN SODIUM-DEXTROSE 2-4 GM/100ML-% IV SOLN: 2.0000 g | INTRAVENOUS | Status: AC

## 2024-02-07 NOTE — Progress Notes (Signed)
 PROGRESS NOTE                                                                                                                                                                                                             Patient Demographics:    Manuel Ortega, is a 73 y.o. male, DOB - 1951/01/09, GNF:[id number]  Outpatient Primary MD for the patient is Patient, No Pcp Per    LOS - 3  Admit date - 02/04/2024    Chief Complaint  Patient presents with   Chills   Fever       Brief Narrative (HPI from H&P)    73 y.o. male with medical history significant for glaucoma, hypertension, prostate cancer, ESRD on Tuesday Thursday Saturday dialysis being admitted to the hospital with concern for sepsis of unclear etiology.  He was recently hospitalized at [facility] earlier this month, for sepsis felt to be due to possible pyelonephritis.  He has been doing well, tolerating his dialysis, however yesterday he started feeling generally unwell, weak, vague nausea.  Today he went to dialysis, was feeling very very cold and chilled, says that he normally does feel cold and dialysis center, but when he comes out of there he really feels better.  Today he did not.  He cut his dialysis session short by 1 hour since he was not feeling well.   He was admitted for fever during HD x 2 in the last few weeks, mild leukocytosis, no clear source of infection.   Subjective:   Patient in bed, appears comfortable, denies any headache, no fever, no chest pain or pressure, no shortness of breath , no abdominal pain. No focal weakness.   Assessment  & Plan :    Sepsis-meeting criteria with tachycardia, leukocytosis, possibly due to acute infection given shaking chills.  Lactic acid is elevated to 2.9.  Etiology of his infection is unclear at the time.  He would be at risk for line infection, recurrent UTI, bacteremia.  Right-sided ear fullness with right  frontal sinus fullness.  Likely seasonal allergies and blocked eustachian tube, does have some cerumen on exam in the right ear canal.  No tenderness on palpation on the right mastoid, right frontal and maxillary area, no tenderness on manipulation of his right pinna.  Chest and abdominal exam benign, no skin rashes or  bruises no joint pains or aches.  Of note patient has had a right subclavian HD catheter for 3 years, he has had 2 episodes of rigors, fevers with HD treatments in the last few weeks.  Suspicious for possible infection of his HD catheter, initially he was resistant to catheter removal, has had the catheter for 3 years.  Now agreeable for catheter removal, nephrology, ID and IR on board.  Catheter was removed on 02/06/2024 by IR, line holiday for 2 days replacement of new catheter on 02/08/2024.  Monitor cultures.    ESRD-on TTS schedule, nephrology on board.   Pancreatic insufficiency-continue Creon    Depression/anxiety-his home medications will be resumed once reconciled  HX of lung nodule.  Will require outpatient pulmonary follow-up within a month of discharge.       Condition - Fair  Family Communication  :  None  Code Status :  Full  Consults  :  ID, renal, IR  PUD Prophylaxis :     Procedures  :     Removal of right IJ HD catheter on 02/06/2024   CT   - 1. Chronic paranasal sinus disease, most pronounced in the left maxillary sinus. No significant change compared to head MRI 05/05/2023. 2. Symmetric nasal cavity mucosal thickening raising the possibility of Rhinitis. Rightward nasal septal deviation and spurring. 3. Bilateral middle ears and mastoids are clear. But there is right greater than left external auditory canal debris or cerumen,.      Disposition Plan  :    Status is: Inpatient   DVT Prophylaxis  :    heparin  injection 5,000 Units Start: 02/04/24 1445    Lab Results  Component Value Date   PLT 241 02/07/2024    Diet :  Diet Order              Diet NPO time specified Except for: Sips with Meds  Diet effective midnight           Diet regular Room service appropriate? Yes; Fluid consistency: Thin  Diet effective now                    Inpatient Medications  Scheduled Meds:  bimatoprost   1 drop Both Eyes QHS   calcitRIOL   1.25 mcg Oral Q T,Th,Sa-HD   Chlorhexidine  Gluconate Cloth  6 each Topical Q0600   cinacalcet   30 mg Oral Q T,Th,Sa-HD   ferrous sulfate   325 mg Oral Q breakfast   heparin   5,000 Units Subcutaneous Q8H   lipase/protease/amylase  72,000 Units Oral TID AC   loratadine   10 mg Oral Daily   mirtazapine   15 mg Oral QHS   montelukast   10 mg Oral Daily   pantoprazole   40 mg Oral BID   QUEtiapine  Fumarate  300 mg Oral QHS   sertraline   100 mg Oral BID   sevelamer  carbonate  1,600 mg Oral BID WC   timolol   1 drop Both Eyes q morning   Continuous Infusions:  [START ON 02/08/2024]  ceFAZolin (ANCEF) IV     ceFEPime  (MAXIPIME ) IV 1 g (02/06/24 1303)   [START ON 02/09/2024] vancomycin      PRN Meds:.[DISCONTINUED] acetaminophen  **OR** acetaminophen , acetaminophen , albuterol , hydrALAZINE , iohexol , lipase/protease/amylase, pantoprazole     Objective:   Vitals:   02/06/24 2100 02/06/24 2324 02/07/24 0328 02/07/24 0332  BP: 135/69 115/68  98/61  Pulse:      Resp:      Temp: (!) 97.2 F (36.2 C) 98.2 F (36.8 C)  98.1  F (36.7 C)  TempSrc: Oral Oral Oral Oral  SpO2:      Weight:      Height:        Wt Readings from Last 3 Encounters:  02/06/24 92.9 kg  01/22/24 96.6 kg  01/16/24 90.3 kg    No intake or output data in the 24 hours ending 02/07/24 0808    Physical Exam  Awake Alert, No new F.N deficits, Normal affect [place].AT,PERRAL Supple Neck, No JVD,   Symmetrical Chest wall movement, Good air movement bilaterally, CTAB,   RRR,No Gallops,Rubs or new Murmurs,  +ve B.Sounds, Abd Soft, No tenderness,   No Cyanosis, Clubbing or edema         Data Review:    Recent Labs  Lab  02/04/24 1120 02/05/24 0742 02/07/24 0643  WBC 10.8* 9.3 8.3  HGB 11.7* 10.4* 10.5*  HCT 35.5* 31.2* 31.6*  PLT 241 237 241  MCV 90.1 90.2 89.8  MCH 29.7 30.1 29.8  MCHC 33.0 33.3 33.2  RDW 14.4 14.6 14.6  LYMPHSABS 0.9 1.7 2.3  MONOABS 0.8 1.5* 1.0  EOSABS 0.0 0.1 0.2  BASOSABS 0.0 0.0 0.1    Recent Labs  Lab 02/04/24 1120 02/04/24 1127 02/04/24 1723 02/05/24 0742  NA 136  --   --  138  K 3.3*  --   --  4.0  CL 101  --   --  101  CO2 22  --   --  23  ANIONGAP 13  --   --  14  GLUCOSE 108*  --   --  97  BUN 50*  --   --  56*  CREATININE 5.66*  --   --  7.27*  AST 18  --   --   --   ALT 10  --   --   --   ALKPHOS 66  --   --   --   BILITOT 0.6  --   --   --   ALBUMIN  4.0  --   --   --   CRP  --   --   --  10.6*  PROCALCITON  --   --   --  9.29  LATICACIDVEN  --  2.9* 0.9  --   INR 1.0  --   --   --   PHOS  --   --   --  3.7  CALCIUM  9.4  --   --  9.1      Recent Labs  Lab 02/04/24 1120 02/04/24 1127 02/04/24 1723 02/05/24 0742  CRP  --   --   --  10.6*  PROCALCITON  --   --   --  9.29  LATICACIDVEN  --  2.9* 0.9  --   INR 1.0  --   --   --   CALCIUM  9.4  --   --  9.1    --------------------------------------------------------------------------------------------------------------- Lab Results  Component Value Date   CHOL 140 05/14/2023   HDL 45.00 05/14/2023   LDLCALC 72 05/14/2023   TRIG 115.0 05/14/2023   CHOLHDL 3 05/14/2023    Lab Results  Component Value Date   HGBA1C 5.6 05/14/2023      Micro Results Recent Results (from the past 240 hours)  Culture, blood (Routine x 2)     Status: None (Preliminary result)   Collection Time: 02/04/24 11:20 AM   Specimen: BLOOD  Result Value Ref Range Status   Specimen Description   Final    BLOOD RIGHT  ANTECUBITAL Performed at [facility], [address] [place], [place] [address]    Special Requests   Final    BOTTLES DRAWN AEROBIC ONLY Blood Culture results may not be  optimal due to an inadequate volume of blood received in culture bottles Performed at [facility], [address] [place], [place] [address]    Culture   Final    NO GROWTH 2 DAYS Performed at [facility], [address], [place], [place] [address]    Report Status PENDING  Incomplete  Culture, blood (Routine x 2)     Status: None (Preliminary result)   Collection Time: 02/04/24 11:20 AM   Specimen: BLOOD  Result Value Ref Range Status   Specimen Description   Final    BLOOD LEFT ANTECUBITAL Performed at [facility], [address] [place], [place] [address]    Special Requests   Final    BOTTLES DRAWN AEROBIC AND ANAEROBIC Blood Culture results may not be optimal due to an inadequate volume of blood received in culture bottles Performed at [facility], [address] [place], [place] [address]    Culture   Final    NO GROWTH 2 DAYS Performed at [facility], [address], [place], [place] [address]    Report Status PENDING  Incomplete  Resp panel by RT-PCR (RSV, Flu A&B, Covid) Anterior Nasal Swab     Status: None   Collection Time: 02/04/24 11:50 AM   Specimen: Anterior Nasal Swab  Result Value Ref Range Status   SARS Coronavirus 2 by RT PCR NEGATIVE NEGATIVE Final    Comment: (NOTE) SARS-CoV-2 target nucleic acids are NOT DETECTED.  The SARS-CoV-2 RNA is generally detectable in upper respiratory specimens during the acute phase of infection. The lowest concentration of SARS-CoV-2 viral copies this assay can detect is 138 copies/mL. A negative result does not preclude SARS-Cov-2 infection and should not be used as the sole basis for treatment or other patient management decisions. A negative result may occur with  improper specimen collection/handling, submission of specimen other than nasopharyngeal swab, presence of viral mutation(s) within the areas targeted by this assay, and inadequate number  of viral copies(<138 copies/mL). A negative result must be combined with clinical observations, patient history, and epidemiological information. The expected result is Negative.  Fact Sheet for Patients:  BloggerCourse.com  Fact Sheet for Healthcare Providers:  SeriousBroker.it  This test is no t yet approved or cleared by the [place]  FDA and  has been authorized for detection and/or diagnosis of SARS-CoV-2 by FDA under an Emergency Use Authorization (EUA). This EUA will remain  in effect (meaning this test can be used) for the duration of the COVID-19 declaration under Section 564(b)(1) of the Act, 21 U.S.C.section 360bbb-3(b)(1), unless the authorization is terminated  or revoked sooner.       Influenza A by PCR NEGATIVE NEGATIVE Final   Influenza B by PCR NEGATIVE NEGATIVE Final    Comment: (NOTE) The Xpert Xpress SARS-CoV-2/FLU/RSV plus assay is intended as an aid in the diagnosis of influenza from Nasopharyngeal swab specimens and should not be used as a sole basis for treatment. Nasal washings and aspirates are unacceptable for Xpert Xpress SARS-CoV-2/FLU/RSV testing.  Fact Sheet for Patients: BloggerCourse.com  Fact Sheet for Healthcare Providers: SeriousBroker.it  This test is not yet approved or cleared by the [place]  FDA and has been authorized for detection and/or diagnosis of SARS-CoV-2 by FDA  under an Emergency Use Authorization (EUA). This EUA will remain in effect (meaning this test can be used) for the duration of the COVID-19 declaration under Section 564(b)(1) of the Act, 21 U.S.C. section 360bbb-3(b)(1), unless the authorization is terminated or revoked.     Resp Syncytial Virus by PCR NEGATIVE NEGATIVE Final    Comment: (NOTE) Fact Sheet for Patients: BloggerCourse.com  Fact Sheet for Healthcare  Providers: SeriousBroker.it  This test is not yet approved or cleared by the [place]  FDA and has been authorized for detection and/or diagnosis of SARS-CoV-2 by FDA under an Emergency Use Authorization (EUA). This EUA will remain in effect (meaning this test can be used) for the duration of the COVID-19 declaration under Section 564(b)(1) of the Act, 21 U.S.C. section 360bbb-3(b)(1), unless the authorization is terminated or revoked.  Performed at [facility], [address], [place], [place] [address]   Resp panel by RT-PCR (RSV, Flu A&B, Covid) Anterior Nasal Swab     Status: None   Collection Time: 02/05/24  7:19 AM   Specimen: Anterior Nasal Swab  Result Value Ref Range Status   SARS Coronavirus 2 by RT PCR NEGATIVE NEGATIVE Final   Influenza A by PCR NEGATIVE NEGATIVE Final   Influenza B by PCR NEGATIVE NEGATIVE Final    Comment: (NOTE) The Xpert Xpress SARS-CoV-2/FLU/RSV plus assay is intended as an aid in the diagnosis of influenza from Nasopharyngeal swab specimens and should not be used as a sole basis for treatment. Nasal washings and aspirates are unacceptable for Xpert Xpress SARS-CoV-2/FLU/RSV testing.  Fact Sheet for Patients: BloggerCourse.com  Fact Sheet for Healthcare Providers: SeriousBroker.it  This test is not yet approved or cleared by the [place]  FDA and has been authorized for detection and/or diagnosis of SARS-CoV-2 by FDA under an Emergency Use Authorization (EUA). This EUA will remain in effect (meaning this test can be used) for the duration of the COVID-19 declaration under Section 564(b)(1) of the Act, 21 U.S.C. section 360bbb-3(b)(1), unless the authorization is terminated or revoked.     Resp Syncytial Virus by PCR NEGATIVE NEGATIVE Final    Comment: (NOTE) Fact Sheet for Patients: BloggerCourse.com  Fact Sheet  for Healthcare Providers: SeriousBroker.it  This test is not yet approved or cleared by the [place]  FDA and has been authorized for detection and/or diagnosis of SARS-CoV-2 by FDA under an Emergency Use Authorization (EUA). This EUA will remain in effect (meaning this test can be used) for the duration of the COVID-19 declaration under Section 564(b)(1) of the Act, 21 U.S.C. section 360bbb-3(b)(1), unless the authorization is terminated or revoked.  Performed at [facility], [address], [place], [place] [address]   MRSA Next Gen by PCR, Nasal     Status: None   Collection Time: 02/05/24  7:37 AM   Specimen: Anterior Nasal Swab  Result Value Ref Range Status   MRSA by PCR Next Gen NOT DETECTED NOT DETECTED Final    Comment: (NOTE) The GeneXpert MRSA Assay (FDA approved for NASAL specimens only), is one component of a comprehensive MRSA colonization surveillance program. It is not intended to diagnose MRSA infection nor to guide or monitor treatment for MRSA infections. Test performance is not FDA approved in patients less than 21 years old. Performed at [facility], [address] [place], [place] [address]     Radiology Report DG Chest Port 1 View Result Date: 02/05/2024 CLINICAL DATA:  141880 SOB (shortness of breath) 141880 EXAM:  PORTABLE CHEST - 1 VIEW COMPARISON:  February 04, 2024 FINDINGS: No focal airspace consolidation, pleural effusion, or pneumothorax. No cardiomegaly. Right hemodialysis catheter terminates in the lower SVC. No acute fracture or destructive lesion. Multilevel thoracic osteophytosis. IMPRESSION: No significant interval change.  No pneumonia or pulmonary edema. Electronically Signed   By: Rance Burrows M.D.   On: 02/05/2024 08:27     Signature  -   Lynnwood Sauer M.D on 02/07/2024 at 8:08 AM   -  To page go to [email or web address]

## 2024-02-07 NOTE — Progress Notes (Addendum)
 Patient left the floor to [employer] without permission, MD aware  Patient back to his room at 16:16

## 2024-02-07 NOTE — Progress Notes (Signed)
 [facility] Progress Note   Subjective: Seen in room, very pleasant. Drsg CDI TDC removal site. Line holiday until 02/09/2024. Order for [facility] placement has been placed, IR aware.      Objective Vitals:   02/06/24 2100 02/06/24 2324 02/07/24 0328 02/07/24 0332  BP: 135/69 115/68  98/61  Pulse:      Resp:      Temp: (!) 97.2 F (36.2 C) 98.2 F (36.8 C)  98.1 F (36.7 C)  TempSrc: Oral Oral Oral Oral  SpO2:      Weight:      Height:        Additional Objective Labs: Basic Metabolic Panel: Recent Labs  Lab 02/04/24 1120 02/05/24 0742  NA 136 138  K 3.3* 4.0  CL 101 101  CO2 22 23  GLUCOSE 108* 97  BUN 50* 56*  CREATININE 5.66* 7.27*  CALCIUM  9.4 9.1  PHOS  --  3.7   Liver Function Tests: Recent Labs  Lab 02/04/24 1120  AST 18  ALT 10  ALKPHOS 66  BILITOT 0.6  PROT 7.5  ALBUMIN  4.0   Recent Labs  Lab 02/04/24 1128  LIPASE 99*   CBC: Recent Labs  Lab 02/04/24 1120 02/05/24 0742 02/07/24 0643  WBC 10.8* 9.3 8.3  NEUTROABS 9.1* 5.9 4.7  HGB 11.7* 10.4* 10.5*  HCT 35.5* 31.2* 31.6*  MCV 90.1 90.2 89.8  PLT 241 237 241   Blood Culture    Component Value Date/Time   SDES  02/04/2024 1120    BLOOD RIGHT ANTECUBITAL Performed at [facility], [address], [place], [place] [address]    SDES  02/04/2024 1120    BLOOD LEFT ANTECUBITAL Performed at [facility], [address], [place], [place] [address]    SPECREQUEST  02/04/2024 1120    BOTTLES DRAWN AEROBIC ONLY Blood Culture results may not be optimal due to an inadequate volume of blood received in culture bottles Performed at [facility], [address] [place], [place] [address]    SPECREQUEST  02/04/2024 1120    BOTTLES DRAWN AEROBIC AND ANAEROBIC Blood Culture results may not be optimal due to an inadequate volume of blood received in culture bottles Performed at [facility], [address], [place], [place] [address]    CULT  02/04/2024 1120    NO GROWTH 3 DAYS Performed at [facility], [address], [place], [place] [address]    CULT  02/04/2024 1120    NO GROWTH 3 DAYS Performed at [facility], [address] [place], [place] [address]    REPTSTATUS PENDING 02/04/2024 1120   REPTSTATUS PENDING 02/04/2024 1120    Cardiac Enzymes: No results for input(s): "CKTOTAL", "CKMB", "CKMBINDEX", "TROPONINI" in the last 168 hours. CBG: No results for input(s): "GLUCAP" in the last 168 hours. Iron  Studies: No results for input(s): "IRON ", "TIBC", "TRANSFERRIN", "FERRITIN" in the last 72 hours. @lablastinr3 @ Studies/Results: IR Removal Tun Cv Cath W/O FL Result Date: 02/07/2024 INDICATION: 73 year old male with sepsis. IR requested for tunneled HD catheter removal for line holiday. EXAM: REMOVAL TUNNELED CENTRAL VENOUS CATHETER MEDICATIONS: 5 mL of 1% lidocaine  ANESTHESIA/SEDATION: None. FLUOROSCOPY: none COMPLICATIONS: None immediate. PROCEDURE: Informed written consent was obtained from the patient after a thorough discussion of the procedural risks, benefits and alternatives. All questions were addressed. Maximal Sterile Barrier Technique was utilized including caps, mask, sterile gloves, sterile drape, hand hygiene and skin antiseptic. A timeout was performed  prior to the initiation of the procedure. The patient's right chest and catheter was prepped and draped in a normal sterile fashion. 1% lidocaine  was used for local anesthesia. Using gentle blunt dissection the cuff of the catheter was exposed and the catheter was removed in it's entirety. Pressure was held till hemostasis was obtained. A sterile dressing was applied. The patient tolerated the procedure well with no immediate complications. The catheter tip was sent for culture. IMPRESSION: Successful removal of a RIGHT chest tunneled dialysis catheter. Procedure performed by Lambert Pillion, PA-C Electronically Signed    By: Art Largo M.D.   On: 02/07/2024 11:43   Medications:  [START ON 02/08/2024]  ceFAZolin (ANCEF) IV     ceFEPime  (MAXIPIME ) IV 1 g (02/06/24 1303)   [START ON 02/09/2024] vancomycin       bimatoprost   1 drop Both Eyes QHS   calcitRIOL   1.25 mcg Oral Q T,Th,Sa-HD   Chlorhexidine  Gluconate Cloth  6 each Topical Q0600   cinacalcet   30 mg Oral Q T,Th,Sa-HD   ferrous sulfate   325 mg Oral Q breakfast   heparin   5,000 Units Subcutaneous Q8H   lipase/protease/amylase  72,000 Units Oral TID AC   loratadine   10 mg Oral Daily   mirtazapine   15 mg Oral QHS   montelukast   10 mg Oral Daily   pantoprazole   40 mg Oral BID   QUEtiapine  Fumarate  300 mg Oral QHS   [START ON 02/08/2024] sertraline   200 mg Oral Daily   sevelamer  carbonate  1,600 mg Oral BID WC   timolol   1 drop Both Eyes q morning   General:  WN, WD male in NAD  HEENT: NCAT Heart: S1S2 No M/R/G Lungs: CTAB A/P Abdomen: NABS Extremities: No LE edema HD access - None  Outpatient HD orders:  [facility]  TTS  4 hours  BF 400  DF auto 1.5  EDW 94.2 kg  2K /2.5 Ca Tunneled catheter Meds: mircera 75 mcg every 4 weeks (last dose on 01/12/24), venofer  50 mg weekly, sensipar  30 mg three times a week, and calcitriol  1.25 mcg three times a week      Assessment/Plan:   # ESRD  - on HD TTS. HD off schedule 02/05/2024. Line holiday starting 02/06/2024. Needs line replaced 02/09/2024. Needs HD 02/09/2024 after TDC replaced.      # Sepsis  - antibiotics per primary team  - blood cultures were sent,. NGTD. Etiology unclear.  - given that he has a catheter, he is at risk of bacteremia.TDC removed 02/06/2024. Line holiday through 02/09/2024. ID following, BC NGTD. Catheter tip sent for Cx.    # HTN   -Acceptable control on current regimen    # Anemia of CKD  - Hb 10.5. Follow labs.    # Metabolic bone disease  - resume sensipar  and calcitriol    - PO4 corrected Ca+ at goal.  - on renvela  1600 mg BID per most recent  discharge summary  Manuel Ortega H. [name] 02/07/2024, 11:56 AM  [employer] [phone]

## 2024-02-07 NOTE — Plan of Care (Signed)

## 2024-02-07 NOTE — Procedures (Signed)
  Interventional Radiology Procedure Note  PROCEDURE SUMMARY:  Successful removal of tunneled catheter.  No complications.   EBL = trace  Please see full dictation in imaging section of Epic for procedure details.  Electronically Signed: Lovena Rubinstein, PA-C

## 2024-02-07 NOTE — Plan of Care (Signed)
   Problem: Education: Goal: Knowledge of General Education information will improve Description: Including pain rating scale, medication(s)/side effects and non-pharmacologic comfort measures Outcome: Progressing   Problem: Activity: Goal: Risk for activity intolerance will decrease Outcome: Progressing   Problem: Pain Managment: Goal: General experience of comfort will improve and/or be controlled Outcome: Progressing

## 2024-02-07 NOTE — Progress Notes (Signed)
 ID PROGRESS NOTE  Hd line removed yesterday for line holiday. He remains afebrile. Blood cx remain negative. Currently on vanco/cefepime  with hd   Exam non remarkable  A/p:  Plan to continue with vancomycin /cefepime  for additional 24-48hrs Follow blood cx   ESRD - on hd, now with line holiday. To have new line placed on Tuesday followed by HD session. Thus far, does not have bacteremia to preclude line placement  Mieko Kneebone B. Levern Reader MD MPH [facility] [phone]

## 2024-02-08 ENCOUNTER — Telehealth: Payer: Self-pay | Admitting: Acute Care

## 2024-02-08 DIAGNOSIS — A419 Sepsis, unspecified organism: Secondary | ICD-10-CM | POA: Diagnosis not present

## 2024-02-08 DIAGNOSIS — R509 Fever, unspecified: Secondary | ICD-10-CM | POA: Diagnosis not present

## 2024-02-08 DIAGNOSIS — R6520 Severe sepsis without septic shock: Secondary | ICD-10-CM | POA: Diagnosis not present

## 2024-02-08 LAB — CBC WITH DIFFERENTIAL/PLATELET
Abs Immature Granulocytes: 0.47 10*3/uL — ABNORMAL HIGH (ref 0.00–0.07)
Basophils Absolute: 0.1 10*3/uL (ref 0.0–0.1)
Basophils Relative: 1 %
Eosinophils Absolute: 0.3 10*3/uL (ref 0.0–0.5)
Eosinophils Relative: 3 %
HCT: 30.4 % — ABNORMAL LOW (ref 39.0–52.0)
Hemoglobin: 9.8 g/dL — ABNORMAL LOW (ref 13.0–17.0)
Immature Granulocytes: 5 %
Lymphocytes Relative: 24 %
Lymphs Abs: 2.5 10*3/uL (ref 0.7–4.0)
MCH: 29.5 pg (ref 26.0–34.0)
MCHC: 32.2 g/dL (ref 30.0–36.0)
MCV: 91.6 fL (ref 80.0–100.0)
Monocytes Absolute: 1 10*3/uL (ref 0.1–1.0)
Monocytes Relative: 10 %
Neutro Abs: 5.9 10*3/uL (ref 1.7–7.7)
Neutrophils Relative %: 57 %
Platelets: 274 10*3/uL (ref 150–400)
RBC: 3.32 MIL/uL — ABNORMAL LOW (ref 4.22–5.81)
RDW: 14.5 % (ref 11.5–15.5)
WBC: 10.3 10*3/uL (ref 4.0–10.5)
nRBC: 0 % (ref 0.0–0.2)

## 2024-02-08 LAB — C-REACTIVE PROTEIN: CRP: 2.8 mg/dL — ABNORMAL HIGH (ref <1.0)

## 2024-02-08 LAB — PROCALCITONIN: Procalcitonin: 4.29 ng/mL

## 2024-02-08 MED ORDER — POLYETHYLENE GLYCOL 3350 17 G PO PACK
17.0000 g | PACK | Freq: Two times a day (BID) | ORAL | Status: DC
  Administered 2024-02-08 – 2024-02-09 (×3): 17 g via ORAL
  Filled 2024-02-08 (×3): qty 1

## 2024-02-08 MED ORDER — CHLORHEXIDINE GLUCONATE CLOTH 2 % EX PADS: 6.0000 | MEDICATED_PAD | Freq: Every day | CUTANEOUS | Status: DC

## 2024-02-08 MED ORDER — DOCUSATE SODIUM 100 MG PO CAPS
200.0000 mg | ORAL_CAPSULE | Freq: Two times a day (BID) | ORAL | Status: DC
  Administered 2024-02-08 – 2024-02-09 (×3): 200 mg via ORAL
  Filled 2024-02-08 (×3): qty 2

## 2024-02-08 MED ORDER — CEFAZOLIN SODIUM-DEXTROSE 2-4 GM/100ML-% IV SOLN: 2.0000 g | INTRAVENOUS | Status: AC

## 2024-02-08 NOTE — Progress Notes (Signed)
  [facility] Progress Note   Subjective:  Seen in room No c/o's TDC removed Sat after HD New TDC due tomorrow  Objective Vitals:   02/07/24 1943 02/07/24 2357 02/08/24 0836 02/08/24 1202  BP: (!) 126/55 134/77 128/68 109/86  Pulse:   82 77  Resp:   16 18  Temp: 98.1 F (36.7 C) 98.2 F (36.8 C) 97.6 F (36.4 C) 97.6 F (36.4 C)  TempSrc: Oral Oral Oral Oral  SpO2:  98%    Weight:      Height:         Medications:  [START ON 02/09/2024]  ceFAZolin (ANCEF) IV      bimatoprost   1 drop Both Eyes QHS   calcitRIOL   1.25 mcg Oral Q T,Th,Sa-HD   Chlorhexidine  Gluconate Cloth  6 each Topical Q0600   cinacalcet   30 mg Oral Q T,Th,Sa-HD   ferrous sulfate   325 mg Oral Q breakfast   heparin   5,000 Units Subcutaneous Q8H   lipase/protease/amylase  72,000 Units Oral TID AC   loratadine   10 mg Oral Daily   mirtazapine   15 mg Oral QHS   montelukast   10 mg Oral Daily   pantoprazole   40 mg Oral BID   QUEtiapine  Fumarate  300 mg Oral QHS   sertraline   200 mg Oral Daily   sevelamer  carbonate  1,600 mg Oral BID WC   timolol   1 drop Both Eyes q morning   Exam: General:  WN, WD male in NAD  HEENT: NCAT Heart: S1S2 No M/R/G Lungs: CTAB A/P Abdomen: NABS Extremities: No LE edema HD access - None  Outpatient HD orders:  [facility]TS  4h  B400   94.2kg  2K bath  TDC   Heparin  5000 Meds: mircera 75 mcg every 4 weeks (last dose on 01/12/24), venofer  50 mg weekly, sensipar  30 mg three times a week, and calcitriol  1.25 mcg three times a week      Assessment/Plan:   # ESRD  - on HD TTS. HD off schedule 04/25. TDC removed 4/26. For new [facility] per IR tomorrow 4/29. HD tomorrow after [facility] replaced. Labs ordered for am tomorrow.      # Sepsis  - antibiotics per primary team  - blood cultures were sent,. NGTD. Etiology unclear.  - given that he has a catheter and had rigors on HD, likely TDC was infected.TDC removed as above. ID following, BC NGTD. Catheter tip sent for Cx.    #  HTN   -Acceptable control on current regimen    # Anemia of CKD  - Hb 10.5. Follow labs.    # Metabolic bone disease  - resume sensipar  and calcitriol    - PO4 corrected Ca+ at goal.  - on renvela  1600 mg BID per most recent discharge summary  Larry Poag  MD  [facility] 02/08/2024, 4:30 PM  Recent Labs  Lab 02/04/24 1120 02/05/24 0742 02/07/24 0643 02/08/24 0334  HGB 11.7* 10.4* 10.5* 9.8*  ALBUMIN  4.0  --   --   --   CALCIUM  9.4 9.1  --   --   PHOS  --  3.7  --   --   CREATININE 5.66* 7.27*  --   --   K 3.3* 4.0  --   --

## 2024-02-08 NOTE — Progress Notes (Signed)
 Patient stated "I dont wan that", I asked are you refusing he stated "yes".

## 2024-02-08 NOTE — Progress Notes (Signed)
Pt receives out-pt HD at [facility] [place] on TTS. Will assist as needed.   Shemiah Rosch Renal Navigator [phone] 

## 2024-02-08 NOTE — Progress Notes (Signed)
 PROGRESS NOTE                                                                                                                                                                                                             Patient Demographics:    Quenten Steers, is a 73 y.o. male, DOB - 1951-06-17, KVQ:[id number]  Outpatient Primary MD for the patient is Patient, No Pcp Per    LOS - 4  Admit date - 02/04/2024    Chief Complaint  Patient presents with   Chills   Fever       Brief Narrative (HPI from H&P)    73 y.o. male with medical history significant for glaucoma, hypertension, prostate cancer, ESRD on Tuesday Thursday Saturday dialysis being admitted to the hospital with concern for sepsis of unclear etiology.  He was recently hospitalized at [facility] earlier this month, for sepsis felt to be due to possible pyelonephritis.  He has been doing well, tolerating his dialysis, however yesterday he started feeling generally unwell, weak, vague nausea.  Today he went to dialysis, was feeling very very cold and chilled, says that he normally does feel cold and dialysis center, but when he comes out of there he really feels better.  Today he did not.  He cut his dialysis session short by 1 hour since he was not feeling well.   He was admitted for fever during HD x 2 in the last few weeks, mild leukocytosis, no clear source of infection.   Subjective:   Patient in bed, appears comfortable, denies any headache, no fever, no chest pain or pressure, no shortness of breath , no abdominal pain. No new focal weakness.   Assessment  & Plan :    Sepsis-meeting criteria with tachycardia, leukocytosis, possibly due to acute infection given shaking chills.  Lactic acid is elevated to 2.9.  Etiology of his infection is unclear at the time.  He would be at risk for line infection, recurrent UTI, bacteremia.  Right-sided ear fullness with  right frontal sinus fullness.  Likely seasonal allergies and blocked eustachian tube, does have some cerumen on exam in the right ear canal.  No tenderness on palpation on the right mastoid, right frontal and maxillary area, no tenderness on manipulation of his right pinna.  Chest and abdominal exam benign, no skin rashes  or bruises no joint pains or aches.  Of note patient has had a right subclavian HD catheter for 3 years, he has had 2 episodes of rigors, fevers with HD treatments in the last few weeks.  Suspicious for possible infection of his HD catheter, initially he was resistant to catheter removal, has had the catheter for 3 years.  Now agreeable for catheter removal, nephrology, ID and IR on board.  Catheter was removed on 02/06/2024 by IR, line holiday for 2 days replacement of new catheter on 02/09/2024.  Monitor cultures.    ESRD-on TTS schedule, nephrology on board.   Pancreatic insufficiency-continue Creon    Depression/anxiety-his home medications will be resumed once reconciled  HX of lung nodule.  Will require outpatient pulmonary follow-up within a month of discharge.       Condition - Fair  Family Communication  :  None  Code Status :  Full  Consults  :  ID, renal, IR  PUD Prophylaxis :     Procedures  :     Removal of right IJ HD catheter on 02/06/2024   CT   - 1. Chronic paranasal sinus disease, most pronounced in the left maxillary sinus. No significant change compared to head MRI 05/05/2023. 2. Symmetric nasal cavity mucosal thickening raising the possibility of Rhinitis. Rightward nasal septal deviation and spurring. 3. Bilateral middle ears and mastoids are clear. But there is right greater than left external auditory canal debris or cerumen,.      Disposition Plan  :    Status is: Inpatient   DVT Prophylaxis  :    heparin  injection 5,000 Units Start: 02/04/24 1445    Lab Results  Component Value Date   PLT 274 02/08/2024    Diet :  Diet Order              Diet NPO time specified Except for: Sips with Meds  Diet effective midnight           Diet regular Room service appropriate? Yes; Fluid consistency: Thin  Diet effective now                    Inpatient Medications  Scheduled Meds:  bimatoprost   1 drop Both Eyes QHS   calcitRIOL   1.25 mcg Oral Q T,Th,Sa-HD   Chlorhexidine  Gluconate Cloth  6 each Topical Q0600   cinacalcet   30 mg Oral Q T,Th,Sa-HD   ferrous sulfate   325 mg Oral Q breakfast   heparin   5,000 Units Subcutaneous Q8H   lipase/protease/amylase  72,000 Units Oral TID AC   loratadine   10 mg Oral Daily   mirtazapine   15 mg Oral QHS   montelukast   10 mg Oral Daily   pantoprazole   40 mg Oral BID   QUEtiapine  Fumarate  300 mg Oral QHS   sertraline   200 mg Oral Daily   sevelamer  carbonate  1,600 mg Oral BID WC   timolol   1 drop Both Eyes q morning   Continuous Infusions:   ceFAZolin (ANCEF) IV     ceFEPime  (MAXIPIME ) IV 1 g (02/07/24 1348)   [START ON 02/09/2024] vancomycin      PRN Meds:.[DISCONTINUED] acetaminophen  **OR** acetaminophen , acetaminophen , albuterol , hydrALAZINE , iohexol , lipase/protease/amylase, pantoprazole     Objective:   Vitals:   02/07/24 1600 02/07/24 1943 02/07/24 2357 02/08/24 0836  BP: 103/66 (!) 126/55 134/77 128/68  Pulse: 72   82  Resp: 19   16  Temp: 98.1 F (36.7 C) 98.1 F (36.7 C) 98.2 F (36.8 C)  97.6 F (36.4 C)  TempSrc: Oral Oral Oral Oral  SpO2: 97%  98%   Weight:      Height:        Wt Readings from Last 3 Encounters:  02/06/24 92.9 kg  01/22/24 96.6 kg  01/16/24 90.3 kg    No intake or output data in the 24 hours ending 02/08/24 0851    Physical Exam  Awake Alert, No new F.N deficits, Normal affect Benedict.AT,PERRAL Supple Neck, No JVD,   Symmetrical Chest wall movement, Good air movement bilaterally, CTAB,   RRR,No Gallops,Rubs or new Murmurs,  +ve B.Sounds, Abd Soft, No tenderness,   No Cyanosis, Clubbing or edema       Data Review:     Recent Labs  Lab 02/04/24 1120 02/05/24 0742 02/07/24 0643 02/08/24 0334  WBC 10.8* 9.3 8.3 10.3  HGB 11.7* 10.4* 10.5* 9.8*  HCT 35.5* 31.2* 31.6* 30.4*  PLT 241 237 241 274  MCV 90.1 90.2 89.8 91.6  MCH 29.7 30.1 29.8 29.5  MCHC 33.0 33.3 33.2 32.2  RDW 14.4 14.6 14.6 14.5  LYMPHSABS 0.9 1.7 2.3 2.5  MONOABS 0.8 1.5* 1.0 1.0  EOSABS 0.0 0.1 0.2 0.3  BASOSABS 0.0 0.0 0.1 0.1    Recent Labs  Lab 02/04/24 1120 02/04/24 1127 02/04/24 1723 02/05/24 0742 02/07/24 0643 02/08/24 0334  NA 136  --   --  138  --   --   K 3.3*  --   --  4.0  --   --   CL 101  --   --  101  --   --   CO2 22  --   --  23  --   --   ANIONGAP 13  --   --  14  --   --   GLUCOSE 108*  --   --  97  --   --   BUN 50*  --   --  56*  --   --   CREATININE 5.66*  --   --  7.27*  --   --   AST 18  --   --   --   --   --   ALT 10  --   --   --   --   --   ALKPHOS 66  --   --   --   --   --   BILITOT 0.6  --   --   --   --   --   ALBUMIN  4.0  --   --   --   --   --   CRP  --   --   --  10.6* 4.6* 2.8*  PROCALCITON  --   --   --  9.29 6.62 4.29  LATICACIDVEN  --  2.9* 0.9  --   --   --   INR 1.0  --   --   --   --   --   PHOS  --   --   --  3.7  --   --   CALCIUM  9.4  --   --  9.1  --   --       Recent Labs  Lab 02/04/24 1120 02/04/24 1127 02/04/24 1723 02/05/24 0742 02/07/24 0643 02/08/24 0334  CRP  --   --   --  10.6* 4.6* 2.8*  PROCALCITON  --   --   --  9.29 6.62 4.29  LATICACIDVEN  --  2.9* 0.9  --   --   --  INR 1.0  --   --   --   --   --   CALCIUM  9.4  --   --  9.1  --   --     --------------------------------------------------------------------------------------------------------------- Lab Results  Component Value Date   CHOL 140 05/14/2023   HDL 45.00 05/14/2023   LDLCALC 72 05/14/2023   TRIG 115.0 05/14/2023   CHOLHDL 3 05/14/2023    Lab Results  Component Value Date   HGBA1C 5.6 05/14/2023      Micro Results Recent Results (from the past 240 hours)   Culture, blood (Routine x 2)     Status: None (Preliminary result)   Collection Time: 02/04/24 11:20 AM   Specimen: BLOOD  Result Value Ref Range Status   Specimen Description   Final    BLOOD RIGHT ANTECUBITAL Performed at [facility], [address] [place], [place] [address]    Special Requests   Final    BOTTLES DRAWN AEROBIC ONLY Blood Culture results may not be optimal due to an inadequate volume of blood received in culture bottles Performed at [facility], [address], [place], [place] [address]    Culture   Final    NO GROWTH 4 DAYS Performed at [facility], [address], [place], [place] [address]    Report Status PENDING  Incomplete  Culture, blood (Routine x 2)     Status: None (Preliminary result)   Collection Time: 02/04/24 11:20 AM   Specimen: BLOOD  Result Value Ref Range Status   Specimen Description   Final    BLOOD LEFT ANTECUBITAL Performed at [facility], [address] [place], [place] [address]    Special Requests   Final    BOTTLES DRAWN AEROBIC AND ANAEROBIC Blood Culture results may not be optimal due to an inadequate volume of blood received in culture bottles Performed at [facility], [address] [place], [place] [address]    Culture   Final    NO GROWTH 4 DAYS Performed at [facility], [address] [place], [place] [address]    Report Status PENDING  Incomplete  Resp panel by RT-PCR (RSV, Flu A&B, Covid) Anterior Nasal Swab     Status: None   Collection Time: 02/04/24 11:50 AM   Specimen: Anterior Nasal Swab  Result Value Ref Range Status   SARS Coronavirus 2 by RT PCR NEGATIVE NEGATIVE Final    Comment: (NOTE) SARS-CoV-2 target nucleic acids are NOT DETECTED.  The SARS-CoV-2 RNA is generally detectable in upper respiratory specimens during the acute phase of infection. The lowest concentration of SARS-CoV-2 viral copies this assay can  detect is 138 copies/mL. A negative result does not preclude SARS-Cov-2 infection and should not be used as the sole basis for treatment or other patient management decisions. A negative result may occur with  improper specimen collection/handling, submission of specimen other than nasopharyngeal swab, presence of viral mutation(s) within the areas targeted by this assay, and inadequate number of viral copies(<138 copies/mL). A negative result must be combined with clinical observations, patient history, and epidemiological information. The expected result is Negative.  Fact Sheet for Patients:  BloggerCourse.com  Fact Sheet for Healthcare Providers:  SeriousBroker.it  This test is no t yet approved or cleared by the [place]  FDA and  has been authorized for detection and/or diagnosis of SARS-CoV-2 by FDA under an Emergency Use Authorization (EUA). This EUA will remain  in effect (  meaning this test can be used) for the duration of the COVID-19 declaration under Section 564(b)(1) of the Act, 21 U.S.C.section 360bbb-3(b)(1), unless the authorization is terminated  or revoked sooner.       Influenza A by PCR NEGATIVE NEGATIVE Final   Influenza B by PCR NEGATIVE NEGATIVE Final    Comment: (NOTE) The Xpert Xpress SARS-CoV-2/FLU/RSV plus assay is intended as an aid in the diagnosis of influenza from Nasopharyngeal swab specimens and should not be used as a sole basis for treatment. Nasal washings and aspirates are unacceptable for Xpert Xpress SARS-CoV-2/FLU/RSV testing.  Fact Sheet for Patients: BloggerCourse.com  Fact Sheet for Healthcare Providers: SeriousBroker.it  This test is not yet approved or cleared by the [place]  FDA and has been authorized for detection and/or diagnosis of SARS-CoV-2 by FDA under an Emergency Use Authorization (EUA). This EUA will remain in  effect (meaning this test can be used) for the duration of the COVID-19 declaration under Section 564(b)(1) of the Act, 21 U.S.C. section 360bbb-3(b)(1), unless the authorization is terminated or revoked.     Resp Syncytial Virus by PCR NEGATIVE NEGATIVE Final    Comment: (NOTE) Fact Sheet for Patients: BloggerCourse.com  Fact Sheet for Healthcare Providers: SeriousBroker.it  This test is not yet approved or cleared by the [place]  FDA and has been authorized for detection and/or diagnosis of SARS-CoV-2 by FDA under an Emergency Use Authorization (EUA). This EUA will remain in effect (meaning this test can be used) for the duration of the COVID-19 declaration under Section 564(b)(1) of the Act, 21 U.S.C. section 360bbb-3(b)(1), unless the authorization is terminated or revoked.  Performed at [facility], [address] [place], [place] [address]   Resp panel by RT-PCR (RSV, Flu A&B, Covid) Anterior Nasal Swab     Status: None   Collection Time: 02/05/24  7:19 AM   Specimen: Anterior Nasal Swab  Result Value Ref Range Status   SARS Coronavirus 2 by RT PCR NEGATIVE NEGATIVE Final   Influenza A by PCR NEGATIVE NEGATIVE Final   Influenza B by PCR NEGATIVE NEGATIVE Final    Comment: (NOTE) The Xpert Xpress SARS-CoV-2/FLU/RSV plus assay is intended as an aid in the diagnosis of influenza from Nasopharyngeal swab specimens and should not be used as a sole basis for treatment. Nasal washings and aspirates are unacceptable for Xpert Xpress SARS-CoV-2/FLU/RSV testing.  Fact Sheet for Patients: BloggerCourse.com  Fact Sheet for Healthcare Providers: SeriousBroker.it  This test is not yet approved or cleared by the [place]  FDA and has been authorized for detection and/or diagnosis of SARS-CoV-2 by FDA under an Emergency Use Authorization (EUA). This EUA  will remain in effect (meaning this test can be used) for the duration of the COVID-19 declaration under Section 564(b)(1) of the Act, 21 U.S.C. section 360bbb-3(b)(1), unless the authorization is terminated or revoked.     Resp Syncytial Virus by PCR NEGATIVE NEGATIVE Final    Comment: (NOTE) Fact Sheet for Patients: BloggerCourse.com  Fact Sheet for Healthcare Providers: SeriousBroker.it  This test is not yet approved or cleared by the [place]  FDA and has been authorized for detection and/or diagnosis of SARS-CoV-2 by FDA under an Emergency Use Authorization (EUA). This EUA will remain in effect (meaning this test can be used) for the duration of the COVID-19 declaration under Section 564(b)(1) of the Act, 21 U.S.C. section 360bbb-3(b)(1), unless the authorization is terminated or revoked.  Performed at [facility], [address], [place], [place]  [address]   MRSA Next Gen by PCR, Nasal     Status: None   Collection Time: 02/05/24  7:37 AM   Specimen: Anterior Nasal Swab  Result Value Ref Range Status   MRSA by PCR Next Gen NOT DETECTED NOT DETECTED Final    Comment: (NOTE) The GeneXpert MRSA Assay (FDA approved for NASAL specimens only), is one component of a comprehensive MRSA colonization surveillance program. It is not intended to diagnose MRSA infection nor to guide or monitor treatment for MRSA infections. Test performance is not FDA approved in patients less than 20 years old. Performed at [facility], [address] [place], [place] [address]   Cath Tip Culture     Status: None (Preliminary result)   Collection Time: 02/06/24 12:13 PM   Specimen: Catheter Tip; Other  Result Value Ref Range Status   Specimen Description CATH TIP  Final   Special Requests Normal  Final   Culture   Final    NO GROWTH 1 DAY Performed at [facility], [address] [place], [place] [address]    Report  Status PENDING  Incomplete    Radiology Report IR Removal Tun Cv Cath W/O FL Result Date: 02/07/2024 INDICATION: 73 year old male with sepsis. IR requested for tunneled HD catheter removal for line holiday. EXAM: REMOVAL TUNNELED CENTRAL VENOUS CATHETER MEDICATIONS: 5 mL of 1% lidocaine  ANESTHESIA/SEDATION: None. FLUOROSCOPY: none COMPLICATIONS: None immediate. PROCEDURE: Informed written consent was obtained from the patient after a thorough discussion of the procedural risks, benefits and alternatives. All questions were addressed. Maximal Sterile Barrier Technique was utilized including caps, mask, sterile gloves, sterile drape, hand hygiene and skin antiseptic. A timeout was performed prior to the initiation of the procedure. The patient's right chest and catheter was prepped and draped in a normal sterile fashion. 1% lidocaine  was used for local anesthesia. Using gentle blunt dissection the cuff of the catheter was exposed and the catheter was removed in it's entirety. Pressure was held till hemostasis was obtained. A sterile dressing was applied. The patient tolerated the procedure well with no immediate complications. The catheter tip was sent for culture. IMPRESSION: Successful removal of a RIGHT chest tunneled dialysis catheter. Procedure performed by Lambert Pillion, PA-C Electronically Signed   By: Art Largo M.D.   On: 02/07/2024 11:43     Signature  -   Lynnwood Sauer M.D on 02/08/2024 at 8:51 AM   -  To page go to [email or web address]

## 2024-02-08 NOTE — Care Management Important Message (Signed)
 Important Message  Patient Details  Name: Manuel Ortega MRN: [id number] Date of Birth: 1951/06/29   Important Message Given:  Yes - Medicare IM     Wynonia Hedges 02/08/2024, 4:00 PM

## 2024-02-08 NOTE — Plan of Care (Signed)

## 2024-02-08 NOTE — Progress Notes (Signed)
 [facility]  Date of Admission:  02/04/2024     Reason for Follow Up: Sepsis [facility])  Total days of antibiotics 4         ASSESSMENT:  [name] continues to remain afebrile and is s/p line removal in the setting of recurrent fevers of unknown origin. Work up to this point has been unremarkable. Has been having waxing and waning ear pain which is likely unrelated and may benefit from outpatient evaluation by Audiology/ENT. With no clear source of infection will discontinue antibiotics and monitor at this point. Plan for line placement tomorrow. Will continue to monitor cath tip and blood cultures until finalized. Continue standard/universal precautions. Remaining medical and supportive care per Internal Medicine and Nephrology.  PLAN:  Stop antibiotics.  Monitor cath tip and blood cultures until finalized. Line holiday per Nephrology. Follow up outpatient for hearing/ear concerns with Audiology/ENT.  Continue standard/universal precautions. Remaining medical and supportive care per Internal Medicine and Nephrology.   Principal Problem:   Sepsis (HCC)    bimatoprost   1 drop Both Eyes QHS   calcitRIOL   1.25 mcg Oral Q T,Th,Sa-HD   Chlorhexidine  Gluconate Cloth  6 each Topical Q0600   cinacalcet   30 mg Oral Q T,Th,Sa-HD   ferrous sulfate   325 mg Oral Q breakfast   heparin   5,000 Units Subcutaneous Q8H   lipase/protease/amylase  72,000 Units Oral TID AC   loratadine   10 mg Oral Daily   mirtazapine   15 mg Oral QHS   montelukast   10 mg Oral Daily   pantoprazole   40 mg Oral BID   QUEtiapine  Fumarate  300 mg Oral QHS   sertraline   200 mg Oral Daily   sevelamer  carbonate  1,600 mg Oral BID WC   timolol   1 drop Both Eyes q morning    SUBJECTIVE:  Afebrile overnight with no acute events. Line removed yesterday without complication. Tolerating antibiotics with no adverse side effects. Has concerns about his right ear and pain that has been waxing and  waning. Underlying tinnitus that has not been evaluated further. Imaging and previous clinical exam were without significant findings.   No Known Allergies   Review of Systems: Review of Systems  Constitutional:  Negative for chills, fever and weight loss.  HENT:  Positive for ear pain and tinnitus.   Respiratory:  Negative for cough, shortness of breath and wheezing.   Cardiovascular:  Negative for chest pain and leg swelling.  Gastrointestinal:  Negative for abdominal pain, constipation, diarrhea, nausea and vomiting.  Skin:  Negative for rash.      OBJECTIVE: Vitals:   02/07/24 1943 02/07/24 2357 02/08/24 0836 02/08/24 1202  BP: (!) 126/55 134/77 128/68 109/86  Pulse:   82 77  Resp:   16 18  Temp: 98.1 F (36.7 C) 98.2 F (36.8 C) 97.6 F (36.4 C) 97.6 F (36.4 C)  TempSrc: Oral Oral Oral Oral  SpO2:  98%    Weight:      Height:       Body mass index is 27.02 kg/m.  Physical Exam Constitutional:      General: He is not in acute distress.    Appearance: He is well-developed.  Cardiovascular:     Rate and Rhythm: Normal rate and regular rhythm.     Heart sounds: Normal heart sounds.  Pulmonary:     Effort: Pulmonary effort is normal.     Breath sounds: Normal breath sounds.  Skin:    General: Skin is  warm and dry.  Neurological:     Mental Status: He is alert.  Psychiatric:        Mood and Affect: Mood normal.     Lab Results Lab Results  Component Value Date   WBC 10.3 02/08/2024   HGB 9.8 (L) 02/08/2024   HCT 30.4 (L) 02/08/2024   MCV 91.6 02/08/2024   PLT 274 02/08/2024    Lab Results  Component Value Date   CREATININE 7.27 (H) 02/05/2024   BUN 56 (H) 02/05/2024   NA 138 02/05/2024   K 4.0 02/05/2024   CL 101 02/05/2024   CO2 23 02/05/2024    Lab Results  Component Value Date   ALT 10 02/04/2024   AST 18 02/04/2024   ALKPHOS 66 02/04/2024   BILITOT 0.6 02/04/2024     Microbiology: Recent Results (from the past 240 hours)  Culture,  blood (Routine x 2)     Status: None (Preliminary result)   Collection Time: 02/04/24 11:20 AM   Specimen: BLOOD  Result Value Ref Range Status   Specimen Description   Final    BLOOD RIGHT ANTECUBITAL Performed at [facility], [address], [place], [place] [address]    Special Requests   Final    BOTTLES DRAWN AEROBIC ONLY Blood Culture results may not be optimal due to an inadequate volume of blood received in culture bottles Performed at [facility], [address] [place], [place] [address]    Culture   Final    NO GROWTH 4 DAYS Performed at [facility], [address], [place], [place] [address]    Report Status PENDING  Incomplete  Culture, blood (Routine x 2)     Status: None (Preliminary result)   Collection Time: 02/04/24 11:20 AM   Specimen: BLOOD  Result Value Ref Range Status   Specimen Description   Final    BLOOD LEFT ANTECUBITAL Performed at [facility], [address] [place], [place] [address]    Special Requests   Final    BOTTLES DRAWN AEROBIC AND ANAEROBIC Blood Culture results may not be optimal due to an inadequate volume of blood received in culture bottles Performed at [facility], [address] [place], [place] [address]    Culture   Final    NO GROWTH 4 DAYS Performed at [facility], [address], [place], [place] [address]    Report Status PENDING  Incomplete  Resp panel by RT-PCR (RSV, Flu A&B, Covid) Anterior Nasal Swab     Status: None   Collection Time: 02/04/24 11:50 AM   Specimen: Anterior Nasal Swab  Result Value Ref Range Status   SARS Coronavirus 2 by RT PCR NEGATIVE NEGATIVE Final    Comment: (NOTE) SARS-CoV-2 target nucleic acids are NOT DETECTED.  The SARS-CoV-2 RNA is generally detectable in upper respiratory specimens during the acute phase of infection. The lowest concentration of SARS-CoV-2 viral copies this assay can detect  is 138 copies/mL. A negative result does not preclude SARS-Cov-2 infection and should not be used as the sole basis for treatment or other patient management decisions. A negative result may occur with  improper specimen collection/handling, submission of specimen other than nasopharyngeal swab, presence of viral mutation(s) within the areas targeted by this assay, and inadequate number of viral copies(<138 copies/mL). A negative result must be combined with clinical observations, patient history, and epidemiological information. The expected result is Negative.  Fact Sheet  for Patients:  BloggerCourse.com  Fact Sheet for Healthcare Providers:  SeriousBroker.it  This test is no t yet approved or cleared by the [place]  FDA and  has been authorized for detection and/or diagnosis of SARS-CoV-2 by FDA under an Emergency Use Authorization (EUA). This EUA will remain  in effect (meaning this test can be used) for the duration of the COVID-19 declaration under Section 564(b)(1) of the Act, 21 U.S.C.section 360bbb-3(b)(1), unless the authorization is terminated  or revoked sooner.       Influenza A by PCR NEGATIVE NEGATIVE Final   Influenza B by PCR NEGATIVE NEGATIVE Final    Comment: (NOTE) The Xpert Xpress SARS-CoV-2/FLU/RSV plus assay is intended as an aid in the diagnosis of influenza from Nasopharyngeal swab specimens and should not be used as a sole basis for treatment. Nasal washings and aspirates are unacceptable for Xpert Xpress SARS-CoV-2/FLU/RSV testing.  Fact Sheet for Patients: BloggerCourse.com  Fact Sheet for Healthcare Providers: SeriousBroker.it  This test is not yet approved or cleared by the [place]  FDA and has been authorized for detection and/or diagnosis of SARS-CoV-2 by FDA under an Emergency Use Authorization (EUA). This EUA will remain in effect  (meaning this test can be used) for the duration of the COVID-19 declaration under Section 564(b)(1) of the Act, 21 U.S.C. section 360bbb-3(b)(1), unless the authorization is terminated or revoked.     Resp Syncytial Virus by PCR NEGATIVE NEGATIVE Final    Comment: (NOTE) Fact Sheet for Patients: BloggerCourse.com  Fact Sheet for Healthcare Providers: SeriousBroker.it  This test is not yet approved or cleared by the [place]  FDA and has been authorized for detection and/or diagnosis of SARS-CoV-2 by FDA under an Emergency Use Authorization (EUA). This EUA will remain in effect (meaning this test can be used) for the duration of the COVID-19 declaration under Section 564(b)(1) of the Act, 21 U.S.C. section 360bbb-3(b)(1), unless the authorization is terminated or revoked.  Performed at [facility], [address], [place], [place] [address]   Resp panel by RT-PCR (RSV, Flu A&B, Covid) Anterior Nasal Swab     Status: None   Collection Time: 02/05/24  7:19 AM   Specimen: Anterior Nasal Swab  Result Value Ref Range Status   SARS Coronavirus 2 by RT PCR NEGATIVE NEGATIVE Final   Influenza A by PCR NEGATIVE NEGATIVE Final   Influenza B by PCR NEGATIVE NEGATIVE Final    Comment: (NOTE) The Xpert Xpress SARS-CoV-2/FLU/RSV plus assay is intended as an aid in the diagnosis of influenza from Nasopharyngeal swab specimens and should not be used as a sole basis for treatment. Nasal washings and aspirates are unacceptable for Xpert Xpress SARS-CoV-2/FLU/RSV testing.  Fact Sheet for Patients: BloggerCourse.com  Fact Sheet for Healthcare Providers: SeriousBroker.it  This test is not yet approved or cleared by the [place]  FDA and has been authorized for detection and/or diagnosis of SARS-CoV-2 by FDA under an Emergency Use Authorization (EUA). This EUA will  remain in effect (meaning this test can be used) for the duration of the COVID-19 declaration under Section 564(b)(1) of the Act, 21 U.S.C. section 360bbb-3(b)(1), unless the authorization is terminated or revoked.     Resp Syncytial Virus by PCR NEGATIVE NEGATIVE Final    Comment: (NOTE) Fact Sheet for Patients: BloggerCourse.com  Fact Sheet for Healthcare Providers: SeriousBroker.it  This test is not yet approved or cleared by the [place]  FDA and has been authorized for detection and/or diagnosis of SARS-CoV-2 by FDA under an  Emergency Use Authorization (EUA). This EUA will remain in effect (meaning this test can be used) for the duration of the COVID-19 declaration under Section 564(b)(1) of the Act, 21 U.S.C. section 360bbb-3(b)(1), unless the authorization is terminated or revoked.  Performed at [facility], [address], [place], [place] [address]   MRSA Next Gen by PCR, Nasal     Status: None   Collection Time: 02/05/24  7:37 AM   Specimen: Anterior Nasal Swab  Result Value Ref Range Status   MRSA by PCR Next Gen NOT DETECTED NOT DETECTED Final    Comment: (NOTE) The GeneXpert MRSA Assay (FDA approved for NASAL specimens only), is one component of a comprehensive MRSA colonization surveillance program. It is not intended to diagnose MRSA infection nor to guide or monitor treatment for MRSA infections. Test performance is not FDA approved in patients less than 45 years old. Performed at [facility], [address], [place], [place] [address]   Cath Tip Culture     Status: None (Preliminary result)   Collection Time: 02/06/24 12:13 PM   Specimen: Catheter Tip; Other  Result Value Ref Range Status   Specimen Description CATH TIP  Final   Special Requests Normal  Final   Culture   Final    NO GROWTH 1 DAY Performed at [facility], [address], [place], [place] [address]    Report  Status PENDING  Incomplete    I have personally spent 26 minutes involved in face-to-face and non-face-to-face activities for this patient on the day of the visit. Professional time spent includes the following activities: Preparing to see the patient (review of tests), Obtaining and/or reviewing separately obtained history (admission/discharge record), Performing a medically appropriate examination and/or evaluation , Ordering medications/tests/procedures, referring and communicating with other health care professionals, Documenting clinical information in the EMR, Independently interpreting results (not separately reported), Communicating results to the patient/family/caregiver, Counseling and educating the patient/family/caregiver and Care coordination (not separately reported).    Marlan Silva, NP [facility]  02/08/2024  12:09 PM

## 2024-02-08 NOTE — TOC CM/SW Note (Signed)
 Transition of Care [facility]) - Inpatient Brief Assessment   Patient Details  Name: Manuel Ortega MRN: [id number] Date of Birth: 07/06/1951  Transition [facility]) CM/SW Contact:    Jannice Mends, LCSW Phone Number: 02/08/2024, 12:37 PM   Clinical Narrative: Patient admitted from home with recent hospital discharge 4/7 and goes to outpatient dialysis. No current TOC needs identified at this time. Please place consult if needs arise.   Transition of Care Asessment: Insurance and Status: Insurance coverage has been reviewed Patient has primary care physician: Yes Home environment has been reviewed: From home Prior level of function:: Independent Prior/Current Home Services: No current home services Social Drivers of Health Review: SDOH reviewed no interventions necessary Readmission risk has been reviewed: Yes Transition of care needs: no transition of care needs at this time

## 2024-02-08 NOTE — Telephone Encounter (Signed)
 Patient called to cancel appointment with Dara Ear, NP 02/09/2024, currently in the hospital and could not make it to the appointment tomorrow-did not want to reschedule, only wanted the procedure scheduled. Patient is adamant about not needing to have a consultation before the procedure because we can look at imaging and referrals. Please advise.

## 2024-02-09 ENCOUNTER — Inpatient Hospital Stay (HOSPITAL_COMMUNITY)

## 2024-02-09 ENCOUNTER — Ambulatory Visit: Admitting: Acute Care

## 2024-02-09 DIAGNOSIS — R509 Fever, unspecified: Secondary | ICD-10-CM | POA: Diagnosis not present

## 2024-02-09 HISTORY — PX: IR US GUIDE VASC ACCESS RIGHT: IMG2390

## 2024-02-09 HISTORY — PX: IR FLUORO GUIDE CV LINE RIGHT: IMG2283

## 2024-02-09 LAB — CBC WITH DIFFERENTIAL/PLATELET
Abs Immature Granulocytes: 0.44 10*3/uL — ABNORMAL HIGH (ref 0.00–0.07)
Basophils Absolute: 0.1 10*3/uL (ref 0.0–0.1)
Basophils Relative: 1 %
Eosinophils Absolute: 0.3 10*3/uL (ref 0.0–0.5)
Eosinophils Relative: 4 %
HCT: 29.1 % — ABNORMAL LOW (ref 39.0–52.0)
Hemoglobin: 9.7 g/dL — ABNORMAL LOW (ref 13.0–17.0)
Immature Granulocytes: 5 %
Lymphocytes Relative: 31 %
Lymphs Abs: 2.6 10*3/uL (ref 0.7–4.0)
MCH: 29.8 pg (ref 26.0–34.0)
MCHC: 33.3 g/dL (ref 30.0–36.0)
MCV: 89.3 fL (ref 80.0–100.0)
Monocytes Absolute: 0.7 10*3/uL (ref 0.1–1.0)
Monocytes Relative: 8 %
Neutro Abs: 4.4 10*3/uL (ref 1.7–7.7)
Neutrophils Relative %: 51 %
Platelets: 288 10*3/uL (ref 150–400)
RBC: 3.26 MIL/uL — ABNORMAL LOW (ref 4.22–5.81)
RDW: 14.6 % (ref 11.5–15.5)
WBC: 8.6 10*3/uL (ref 4.0–10.5)
nRBC: 0 % (ref 0.0–0.2)

## 2024-02-09 LAB — RENAL FUNCTION PANEL
Albumin: 3 g/dL — ABNORMAL LOW (ref 3.5–5.0)
Anion gap: 13 (ref 5–15)
BUN: 78 mg/dL — ABNORMAL HIGH (ref 8–23)
CO2: 20 mmol/L — ABNORMAL LOW (ref 22–32)
Calcium: 9.2 mg/dL (ref 8.9–10.3)
Chloride: 103 mmol/L (ref 98–111)
Creatinine, Ser: 8.47 mg/dL — ABNORMAL HIGH (ref 0.61–1.24)
GFR, Estimated: 6 mL/min — ABNORMAL LOW (ref >60)
Glucose, Bld: 77 mg/dL (ref 70–99)
Phosphorus: 4.3 mg/dL (ref 2.5–4.6)
Potassium: 3.9 mmol/L (ref 3.5–5.1)
Sodium: 136 mmol/L (ref 135–145)

## 2024-02-09 LAB — CATH TIP CULTURE
Culture: NO GROWTH
Special Requests: NORMAL

## 2024-02-09 LAB — CULTURE, BLOOD (ROUTINE X 2)
Culture: NO GROWTH
Culture: NO GROWTH

## 2024-02-09 LAB — C-REACTIVE PROTEIN: CRP: 2.5 mg/dL — ABNORMAL HIGH (ref <1.0)

## 2024-02-09 LAB — PROCALCITONIN: Procalcitonin: 2.45 ng/mL

## 2024-02-09 MED ORDER — ALTEPLASE 2 MG IJ SOLR: 2.0000 mg | Freq: Once | INTRAMUSCULAR | Status: DC | PRN

## 2024-02-09 MED ORDER — CEFAZOLIN SODIUM-DEXTROSE 2-4 GM/100ML-% IV SOLN
INTRAVENOUS | Status: AC | PRN
  Administered 2024-02-09: 2 g via INTRAVENOUS

## 2024-02-09 MED ORDER — HEPARIN SODIUM (PORCINE) 1000 UNIT/ML IJ SOLN
INTRAMUSCULAR | Status: AC
Start: 2024-02-09 — End: ?
  Filled 2024-02-09: qty 10

## 2024-02-09 MED ORDER — HEPARIN SODIUM (PORCINE) 1000 UNIT/ML IJ SOLN
INTRAMUSCULAR | Status: AC
  Filled 2024-02-09: qty 4

## 2024-02-09 MED ORDER — HEPARIN SODIUM (PORCINE) 1000 UNIT/ML DIALYSIS: 1500.0000 [IU] | Freq: Once | INTRAMUSCULAR | Status: DC

## 2024-02-09 MED ORDER — LIDOCAINE-EPINEPHRINE 1 %-1:100000 IJ SOLN: 20.0000 mL | Freq: Once | INTRAMUSCULAR | Status: DC

## 2024-02-09 MED ORDER — ANTICOAGULANT SODIUM CITRATE 4% (200MG/5ML) IV SOLN: 5.0000 mL | Status: DC | PRN

## 2024-02-09 MED ORDER — HEPARIN SODIUM (PORCINE) 1000 UNIT/ML IJ SOLN
INTRAMUSCULAR | Status: AC
Start: 2024-02-09 — End: ?
  Filled 2024-02-09: qty 2

## 2024-02-09 MED ORDER — CEFAZOLIN SODIUM-DEXTROSE 2-4 GM/100ML-% IV SOLN
INTRAVENOUS | Status: AC
  Filled 2024-02-09: qty 100

## 2024-02-09 MED ORDER — HEPARIN SODIUM (PORCINE) 1000 UNIT/ML DIALYSIS: 1000.0000 [IU] | INTRAMUSCULAR | Status: DC | PRN

## 2024-02-09 MED ORDER — ACETAMINOPHEN 325 MG PO TABS
ORAL_TABLET | ORAL | Status: AC
  Filled 2024-02-09: qty 2

## 2024-02-09 MED ORDER — FENTANYL CITRATE (PF) 100 MCG/2ML IJ SOLN
INTRAMUSCULAR | Status: AC | PRN
  Administered 2024-02-09 (×2): 25 ug via INTRAVENOUS

## 2024-02-09 MED ORDER — LIDOCAINE-PRILOCAINE 2.5-2.5 % EX CREA: 1.0000 | TOPICAL_CREAM | CUTANEOUS | Status: DC | PRN

## 2024-02-09 MED ORDER — LIDOCAINE-EPINEPHRINE 1 %-1:100000 IJ SOLN
INTRAMUSCULAR | Status: AC
  Filled 2024-02-09: qty 1

## 2024-02-09 MED ORDER — MIDAZOLAM HCL 2 MG/2ML IJ SOLN
INTRAMUSCULAR | Status: AC
Start: 2024-02-09 — End: ?
  Filled 2024-02-09: qty 2

## 2024-02-09 MED ORDER — LIDOCAINE HCL (PF) 1 % IJ SOLN: 5.0000 mL | INTRAMUSCULAR | Status: DC | PRN

## 2024-02-09 MED ORDER — HEPARIN SODIUM (PORCINE) 1000 UNIT/ML DIALYSIS
1500.0000 [IU] | INTRAMUSCULAR | Status: AC | PRN
  Administered 2024-02-09: 1500 [IU] via INTRAVENOUS_CENTRAL

## 2024-02-09 MED ORDER — NEPRO/CARBSTEADY PO LIQD: 237.0000 mL | ORAL | Status: DC | PRN

## 2024-02-09 MED ORDER — MIDAZOLAM HCL 2 MG/2ML IJ SOLN
INTRAMUSCULAR | Status: AC | PRN
  Administered 2024-02-09: 1 mg via INTRAVENOUS

## 2024-02-09 MED ORDER — PENTAFLUOROPROP-TETRAFLUOROETH EX AERO: 1.0000 | INHALATION_SPRAY | CUTANEOUS | Status: DC | PRN

## 2024-02-09 MED ORDER — FENTANYL CITRATE (PF) 100 MCG/2ML IJ SOLN
INTRAMUSCULAR | Status: AC
  Filled 2024-02-09: qty 2

## 2024-02-09 NOTE — Procedures (Signed)
 Interventional Radiology Procedure Note  Procedure: Tunneled hemodialysis catheter placement  Findings: Please refer to procedural dictation for full description. 23 cm via right internal jugular.  Complications: None immediate  Estimated Blood Loss: < 5 ml  Recommendations: Catheter ready for immediate use.   Creasie Doctor, MD

## 2024-02-09 NOTE — Progress Notes (Signed)
 [facility] Progress Note   Subjective:  Seen in room No c/o's today New TDC placed by IR today  Objective Vitals:   02/09/24 0820 02/09/24 0830 02/09/24 0835 02/09/24 0840  BP: 134/77 132/79 126/78 (!) 140/67  Pulse: 73 76 79 83  Resp: (!) 22 15 12 13   Temp:      TempSrc:      SpO2: 98% 94% 95% 94%  Weight:      Height:         Medications:  anticoagulant sodium citrate      ceFAZolin (ANCEF) IV      bimatoprost   1 drop Both Eyes QHS   calcitRIOL   1.25 mcg Oral Q T,Th,Sa-HD   Chlorhexidine  Gluconate Cloth  6 each Topical Q0600   cinacalcet   30 mg Oral Q T,Th,Sa-HD   docusate sodium   200 mg Oral BID   ferrous sulfate   325 mg Oral Q breakfast   [START ON 02/10/2024] heparin   1,500 Units Dialysis Once in dialysis   heparin   5,000 Units Subcutaneous Q8H   lidocaine -EPINEPHrine   20 mL Intradermal Once   lipase/protease/amylase  72,000 Units Oral TID AC   loratadine   10 mg Oral Daily   mirtazapine   15 mg Oral QHS   montelukast   10 mg Oral Daily   pantoprazole   40 mg Oral BID   polyethylene glycol  17 g Oral BID   QUEtiapine  Fumarate  300 mg Oral QHS   sertraline   200 mg Oral Daily   sevelamer  carbonate  1,600 mg Oral BID WC   timolol   1 drop Both Eyes q morning   Exam: General:  WN, WD male in NAD  HEENT: NCAT Heart: S1S2 No M/R/G Lungs: CTAB A/P Abdomen: NABS Extremities: No LE edema HD access - None  Outpatient HD orders:  [facility] TTS  4h  B400   94.2kg  2K bath  TDC   Heparin  5000 Meds: mircera 75 mcg every 4 weeks (last dose on 01/12/24), venofer  50 mg weekly, sensipar  30 mg three times a week, and calcitriol  1.25 mcg three times a week      Assessment/Plan:   # ESRD  - on HD TTS. HD off schedule 04/25. TDC removed 4/26. For new TDC per IR today. HD today or tonight after new TDC is in.      # Sepsis  - antibiotics per primary team  - blood cultures were sent,. NGTD. Etiology unclear.  - given that he has a catheter and had rigors on HD,  likely TDC was infected. TDC removed as above, cath tip cx is negative. ID following, BCx's are neg to date. Has had 5 days IV abx and no + cx's since removal of HD cath. IV abx have been stopped by ID.    # HTN   -Acceptable control on current regimen    # Anemia of CKD  - Hb 10.5. Follow labs.    # Metabolic bone disease  - resume sensipar  and calcitriol    - PO4 corrected Ca+ at goal.  - on renvela  1600 mg BID per most recent discharge summary  Larry Poag  MD  [facility] 02/09/2024, 2:59 PM  Recent Labs  Lab 02/04/24 1120 02/05/24 0742 02/07/24 0643 02/08/24 0334 02/09/24 0326  HGB 11.7* 10.4*   < > 9.8* 9.7*  ALBUMIN  4.0  --   --   --  3.0*  CALCIUM  9.4 9.1  --   --  9.2  PHOS  --  3.7  --   --  4.3  CREATININE 5.66* 7.27*  --   --  8.47*  K 3.3* 4.0  --   --  3.9   < > = values in this interval not displayed.

## 2024-02-09 NOTE — Progress Notes (Signed)
   02/09/24 1940  Vitals  Temp 98 F (36.7 C)  Temp Source Oral  BP (!) 145/87  MAP (mmHg) 104  BP Location Left Arm  BP Method Automatic  Patient Position (if appropriate) Lying  Pulse Rate 73  Pulse Rate Source Monitor  ECG Heart Rate 73  Resp 13  Oxygen Therapy  SpO2 99 %  O2 Device Room Air  During Treatment Monitoring  Blood Flow Rate (mL/min) 0 mL/min  Arterial Pressure (mmHg) -29.09 mmHg  Venous Pressure (mmHg) 38.79 mmHg  TMP (mmHg) 7.47 mmHg  Ultrafiltration Rate (mL/min) 130 mL/min  Dialysate Flow Rate (mL/min) 299 ml/min  Dialysate Potassium Concentration 2  Dialysate Calcium  Concentration 2.5  Duration of HD Treatment -hour(s) 3.25 hour(s)  Cumulative Fluid Removed (mL) per Treatment  0.02  HD Safety Checks Performed Yes  Intra-Hemodialysis Comments Tx completed  Post Treatment  Dialyzer Clearance Lightly streaked  Liters Processed 78  Fluid Removed (mL) 0 mL  Tolerated HD Treatment Yes  Post-Hemodialysis Comments  (Pt stable)  Hemodialysis Catheter Right Internal jugular Double lumen Permanent (Tunneled)  Placement Date/Time: 02/09/24 0832   Serial / Lot #: [id number]  Expiration Date: 08/31/28  Time Out: Correct patient;Correct site;Correct procedure  Maximum sterile barrier precautions: Hand hygiene;Cap;Mask;Sterile gown;Sterile gloves;Large sterile ...  Site Condition No complications  Blue Lumen Status Flushed;Heparin  locked;Dead end cap in place  Red Lumen Status Flushed;Heparin  locked;Dead end cap in place  Catheter fill solution Heparin  1000 units/ml  Catheter fill volume (Arterial) 1.6 cc  Catheter fill volume (Venous) 1.6  Dressing Type Transparent  Dressing Status Antimicrobial disc/dressing in place;Clean, Dry, Intact  Drainage Description None  Dressing Change Due 02/16/24  Post treatment catheter status Capped and Clamped

## 2024-02-09 NOTE — Discharge Instructions (Signed)
 Follow with Primary MD Patient, No Pcp Per in 7 days   Get CBC, CMP, 2 view Chest X ray -  checked next visit with your primary MD    Activity: As tolerated with Full fall precautions use walker/cane & assistance as needed  Disposition Home     Diet: Renal with 1.2lit/day fluid restriction per day  Special Instructions: If you have smoked or chewed Tobacco  in the last 2 yrs please stop smoking, stop any regular Alcohol  and or any Recreational drug use.  On your next visit with your primary care physician please Get Medicines reviewed and adjusted.  Please request your Prim.MD to go over all Hospital Tests and Procedure/Radiological results at the follow up, please get all Hospital records sent to your Prim MD by signing hospital release before you go home.  If you experience worsening of your admission symptoms, develop shortness of breath, life threatening emergency, suicidal or homicidal thoughts you must seek medical attention immediately by calling 911 or calling your MD immediately  if symptoms less severe.  You Must read complete instructions/literature along with all the possible adverse reactions/side effects for all the Medicines you take and that have been prescribed to you. Take any new Medicines after you have completely understood and accpet all the possible adverse reactions/side effects.   Do not drive when taking Pain medications.  Do not take more than prescribed Pain, Sleep and Anxiety Medications  Wear Seat belts while driving.

## 2024-02-09 NOTE — Progress Notes (Signed)
 PROGRESS NOTE                                                                                                                                                                                                             Patient Demographics:    Manuel Ortega, is a 73 y.o. male, DOB - 1951/07/26, ZOX:[id number]  Outpatient Primary MD for the patient is Patient, No Pcp Per    LOS - 5  Admit date - 02/04/2024    Chief Complaint  Patient presents with   Chills   Fever       Brief Narrative (HPI from H&P)    73 y.o. male with medical history significant for glaucoma, hypertension, prostate cancer, ESRD on Tuesday Thursday Saturday dialysis being admitted to the hospital with concern for sepsis of unclear etiology.  He was recently hospitalized at [facility] earlier this month, for sepsis felt to be due to possible pyelonephritis.  He has been doing well, tolerating his dialysis, however yesterday he started feeling generally unwell, weak, vague nausea.  Today he went to dialysis, was feeling very very cold and chilled, says that he normally does feel cold and dialysis center, but when he comes out of there he really feels better.  Today he did not.  He cut his dialysis session short by 1 hour since he was not feeling well.   He was admitted for fever during HD x 2 in the last few weeks, mild leukocytosis, no clear source of infection.   Subjective:   Patient in bed, appears comfortable, denies any headache, no fever, no chest pain or pressure, no shortness of breath , no abdominal pain. No focal weakness.   Assessment  & Plan :    Sepsis-meeting criteria with tachycardia, leukocytosis, possibly due to acute infection given shaking chills.  Lactic acid is elevated to 2.9.  Etiology of his infection is unclear at the time.  He would be at risk for line infection, recurrent UTI, bacteremia.  Right-sided ear fullness with right  frontal sinus fullness.  Likely seasonal allergies and blocked eustachian tube, does have some cerumen on exam in the right ear canal.  No tenderness on palpation on the right mastoid, right frontal and maxillary area, no tenderness on manipulation of his right pinna.  Chest and abdominal exam benign, no skin rashes or  bruises no joint pains or aches.  Of note patient has had a right subclavian HD catheter for 3 years, he has had 2 episodes of rigors, fevers with HD treatments in the last few weeks.  Suspicious for possible infection of his HD catheter, initially he was resistant to catheter removal, has had the catheter for 3 years.  Now agreeable for catheter removal, nephrology, ID and IR on board.  Catheter was removed on 02/06/2024 by IR, line holiday for 2 days replacement of new catheter on 02/09/2024.  Monitor blood and catheter tip cultures.  Will undergo HD today if stable would like to go home on 02/10/2024.    ESRD-on TTS schedule, nephrology on board.   Pancreatic insufficiency-continue Creon    Depression/anxiety-his home medications will be resumed once reconciled  HX of lung nodule.  Will require outpatient pulmonary follow-up within a month of discharge.  Some chronic mild right ear fullness and pain.  Exam stable, has a lot of cerumen, outpatient ENT follow-up.       Condition - Fair  Family Communication  :  None  Code Status :  Full  Consults  :  ID, renal, IR  PUD Prophylaxis :     Procedures  :     New HD catheter placement due on 02/09/2024.    Removal of right IJ HD catheter on 02/06/2024   CT   - 1. Chronic paranasal sinus disease, most pronounced in the left maxillary sinus. No significant change compared to head MRI 05/05/2023. 2. Symmetric nasal cavity mucosal thickening raising the possibility of Rhinitis. Rightward nasal septal deviation and spurring. 3. Bilateral middle ears and mastoids are clear. But there is right greater than left external auditory canal  debris or cerumen,.      Disposition Plan  :    Status is: Inpatient   DVT Prophylaxis  :    heparin  injection 5,000 Units Start: 02/04/24 1445    Lab Results  Component Value Date   PLT 288 02/09/2024    Diet :  Diet Order             Diet NPO time specified Except for: Sips with Meds  Diet effective midnight                    Inpatient Medications  Scheduled Meds:  bimatoprost   1 drop Both Eyes QHS   calcitRIOL   1.25 mcg Oral Q T,Th,Sa-HD   Chlorhexidine  Gluconate Cloth  6 each Topical Q0600   cinacalcet   30 mg Oral Q T,Th,Sa-HD   docusate sodium   200 mg Oral BID   ferrous sulfate   325 mg Oral Q breakfast   heparin   5,000 Units Subcutaneous Q8H   lipase/protease/amylase  72,000 Units Oral TID AC   loratadine   10 mg Oral Daily   mirtazapine   15 mg Oral QHS   montelukast   10 mg Oral Daily   pantoprazole   40 mg Oral BID   polyethylene glycol  17 g Oral BID   QUEtiapine  Fumarate  300 mg Oral QHS   sertraline   200 mg Oral Daily   sevelamer  carbonate  1,600 mg Oral BID WC   timolol   1 drop Both Eyes q morning   Continuous Infusions:   ceFAZolin (ANCEF) IV     ceFAZolin     PRN Meds:.[DISCONTINUED] acetaminophen  **OR** acetaminophen , acetaminophen , albuterol , ceFAZolin, fentaNYL , hydrALAZINE , iohexol , lipase/protease/amylase, midazolam , pantoprazole     Objective:   Vitals:   02/09/24 8469 02/09/24 0820 02/09/24 0830 02/09/24 6295  BP: 120/70 134/77 132/79 126/78  Pulse: 82 73 76 79  Resp:  (!) 22 15 12   Temp: (!) 97.5 F (36.4 C)     TempSrc:      SpO2: 96% 98% 94% 95%  Weight:      Height:        Wt Readings from Last 3 Encounters:  02/06/24 92.9 kg  01/22/24 96.6 kg  01/16/24 90.3 kg     Intake/Output Summary (Last 24 hours) at 02/09/2024 0836 Last data filed at 02/09/2024 0434 Gross per 24 hour  Intake 480 ml  Output 900 ml  Net -420 ml      Physical Exam  Awake Alert, No new F.N deficits, Normal affect [place].AT,PERRAL Supple  Neck, No JVD,   Symmetrical Chest wall movement, Good air movement bilaterally, CTAB,   RRR,No Gallops,Rubs or new Murmurs,  +ve B.Sounds, Abd Soft, No tenderness,   No Cyanosis, Clubbing or edema       Data Review:    Recent Labs  Lab 02/04/24 1120 02/05/24 0742 02/07/24 0643 02/08/24 0334 02/09/24 0326  WBC 10.8* 9.3 8.3 10.3 8.6  HGB 11.7* 10.4* 10.5* 9.8* 9.7*  HCT 35.5* 31.2* 31.6* 30.4* 29.1*  PLT 241 237 241 274 288  MCV 90.1 90.2 89.8 91.6 89.3  MCH 29.7 30.1 29.8 29.5 29.8  MCHC 33.0 33.3 33.2 32.2 33.3  RDW 14.4 14.6 14.6 14.5 14.6  LYMPHSABS 0.9 1.7 2.3 2.5 2.6  MONOABS 0.8 1.5* 1.0 1.0 0.7  EOSABS 0.0 0.1 0.2 0.3 0.3  BASOSABS 0.0 0.0 0.1 0.1 0.1    Recent Labs  Lab 02/04/24 1120 02/04/24 1127 02/04/24 1723 02/05/24 0742 02/07/24 0643 02/08/24 0334 02/09/24 0326  NA 136  --   --  138  --   --  136  K 3.3*  --   --  4.0  --   --  3.9  CL 101  --   --  101  --   --  103  CO2 22  --   --  23  --   --  20*  ANIONGAP 13  --   --  14  --   --  13  GLUCOSE 108*  --   --  97  --   --  77  BUN 50*  --   --  56*  --   --  78*  CREATININE 5.66*  --   --  7.27*  --   --  8.47*  AST 18  --   --   --   --   --   --   ALT 10  --   --   --   --   --   --   ALKPHOS 66  --   --   --   --   --   --   BILITOT 0.6  --   --   --   --   --   --   ALBUMIN  4.0  --   --   --   --   --  3.0*  CRP  --   --   --  10.6* 4.6* 2.8* 2.5*  PROCALCITON  --   --   --  9.29 6.62 4.29 2.45  LATICACIDVEN  --  2.9* 0.9  --   --   --   --   INR 1.0  --   --   --   --   --   --  PHOS  --   --   --  3.7  --   --  4.3  CALCIUM  9.4  --   --  9.1  --   --  9.2      Recent Labs  Lab 02/04/24 1120 02/04/24 1127 02/04/24 1723 02/05/24 0742 02/07/24 0643 02/08/24 0334 02/09/24 0326  CRP  --   --   --  10.6* 4.6* 2.8* 2.5*  PROCALCITON  --   --   --  9.29 6.62 4.29 2.45  LATICACIDVEN  --  2.9* 0.9  --   --   --   --   INR 1.0  --   --   --   --   --   --   CALCIUM  9.4  --   --   9.1  --   --  9.2    --------------------------------------------------------------------------------------------------------------- Lab Results  Component Value Date   CHOL 140 05/14/2023   HDL 45.00 05/14/2023   LDLCALC 72 05/14/2023   TRIG 115.0 05/14/2023   CHOLHDL 3 05/14/2023    Lab Results  Component Value Date   HGBA1C 5.6 05/14/2023      Micro Results Recent Results (from the past 240 hours)  Culture, blood (Routine x 2)     Status: None   Collection Time: 02/04/24 11:20 AM   Specimen: BLOOD  Result Value Ref Range Status   Specimen Description   Final    BLOOD RIGHT ANTECUBITAL Performed at [facility], [address], [place], [place] [address]    Special Requests   Final    BOTTLES DRAWN AEROBIC ONLY Blood Culture results may not be optimal due to an inadequate volume of blood received in culture bottles Performed at [facility], [address] [place], [place] [address]    Culture   Final    NO GROWTH 5 DAYS Performed at [facility], [address], [place], [place] [address]    Report Status 02/09/2024 FINAL  Final  Culture, blood (Routine x 2)     Status: None   Collection Time: 02/04/24 11:20 AM   Specimen: BLOOD  Result Value Ref Range Status   Specimen Description   Final    BLOOD LEFT ANTECUBITAL Performed at [facility], [address], [place], [place] [address]    Special Requests   Final    BOTTLES DRAWN AEROBIC AND ANAEROBIC Blood Culture results may not be optimal due to an inadequate volume of blood received in culture bottles Performed at [facility], [address] [place], [place] [address]    Culture   Final    NO GROWTH 5 DAYS Performed at [facility], [address], [place], [place] [address]    Report Status 02/09/2024 FINAL  Final  Resp panel by RT-PCR (RSV, Flu A&B, Covid) Anterior Nasal Swab     Status: None   Collection Time:  02/04/24 11:50 AM   Specimen: Anterior Nasal Swab  Result Value Ref Range Status   SARS Coronavirus 2 by RT PCR NEGATIVE NEGATIVE Final    Comment: (NOTE) SARS-CoV-2 target nucleic acids are NOT DETECTED.  The SARS-CoV-2 RNA is generally detectable in upper respiratory specimens during the acute phase of infection. The lowest concentration of SARS-CoV-2 viral copies this assay can detect is 138 copies/mL. A negative result does not preclude SARS-Cov-2 infection and should not be used as the sole basis for treatment or other patient management decisions.  A negative result may occur with  improper specimen collection/handling, submission of specimen other than nasopharyngeal swab, presence of viral mutation(s) within the areas targeted by this assay, and inadequate number of viral copies(<138 copies/mL). A negative result must be combined with clinical observations, patient history, and epidemiological information. The expected result is Negative.  Fact Sheet for Patients:  BloggerCourse.com  Fact Sheet for Healthcare Providers:  SeriousBroker.it  This test is no t yet approved or cleared by the [place]  FDA and  has been authorized for detection and/or diagnosis of SARS-CoV-2 by FDA under an Emergency Use Authorization (EUA). This EUA will remain  in effect (meaning this test can be used) for the duration of the COVID-19 declaration under Section 564(b)(1) of the Act, 21 U.S.C.section 360bbb-3(b)(1), unless the authorization is terminated  or revoked sooner.       Influenza A by PCR NEGATIVE NEGATIVE Final   Influenza B by PCR NEGATIVE NEGATIVE Final    Comment: (NOTE) The Xpert Xpress SARS-CoV-2/FLU/RSV plus assay is intended as an aid in the diagnosis of influenza from Nasopharyngeal swab specimens and should not be used as a sole basis for treatment. Nasal washings and aspirates are unacceptable for Xpert Xpress  SARS-CoV-2/FLU/RSV testing.  Fact Sheet for Patients: BloggerCourse.com  Fact Sheet for Healthcare Providers: SeriousBroker.it  This test is not yet approved or cleared by the [place]  FDA and has been authorized for detection and/or diagnosis of SARS-CoV-2 by FDA under an Emergency Use Authorization (EUA). This EUA will remain in effect (meaning this test can be used) for the duration of the COVID-19 declaration under Section 564(b)(1) of the Act, 21 U.S.C. section 360bbb-3(b)(1), unless the authorization is terminated or revoked.     Resp Syncytial Virus by PCR NEGATIVE NEGATIVE Final    Comment: (NOTE) Fact Sheet for Patients: BloggerCourse.com  Fact Sheet for Healthcare Providers: SeriousBroker.it  This test is not yet approved or cleared by the [place]  FDA and has been authorized for detection and/or diagnosis of SARS-CoV-2 by FDA under an Emergency Use Authorization (EUA). This EUA will remain in effect (meaning this test can be used) for the duration of the COVID-19 declaration under Section 564(b)(1) of the Act, 21 U.S.C. section 360bbb-3(b)(1), unless the authorization is terminated or revoked.  Performed at [facility], [address] [place], [place] [address]   Resp panel by RT-PCR (RSV, Flu A&B, Covid) Anterior Nasal Swab     Status: None   Collection Time: 02/05/24  7:19 AM   Specimen: Anterior Nasal Swab  Result Value Ref Range Status   SARS Coronavirus 2 by RT PCR NEGATIVE NEGATIVE Final   Influenza A by PCR NEGATIVE NEGATIVE Final   Influenza B by PCR NEGATIVE NEGATIVE Final    Comment: (NOTE) The Xpert Xpress SARS-CoV-2/FLU/RSV plus assay is intended as an aid in the diagnosis of influenza from Nasopharyngeal swab specimens and should not be used as a sole basis for treatment. Nasal washings and aspirates are unacceptable  for Xpert Xpress SARS-CoV-2/FLU/RSV testing.  Fact Sheet for Patients: BloggerCourse.com  Fact Sheet for Healthcare Providers: SeriousBroker.it  This test is not yet approved or cleared by the [place]  FDA and has been authorized for detection and/or diagnosis of SARS-CoV-2 by FDA under an Emergency Use Authorization (EUA). This EUA will remain in effect (meaning this test can be used) for the duration of the COVID-19 declaration under Section 564(b)(1) of the Act, 21 U.S.C. section 360bbb-3(b)(1), unless the authorization is terminated or revoked.  Resp Syncytial Virus by PCR NEGATIVE NEGATIVE Final    Comment: (NOTE) Fact Sheet for Patients: BloggerCourse.com  Fact Sheet for Healthcare Providers: SeriousBroker.it  This test is not yet approved or cleared by the [place]  FDA and has been authorized for detection and/or diagnosis of SARS-CoV-2 by FDA under an Emergency Use Authorization (EUA). This EUA will remain in effect (meaning this test can be used) for the duration of the COVID-19 declaration under Section 564(b)(1) of the Act, 21 U.S.C. section 360bbb-3(b)(1), unless the authorization is terminated or revoked.  Performed at [facility], [address] [place], [place] [address]   MRSA Next Gen by PCR, Nasal     Status: None   Collection Time: 02/05/24  7:37 AM   Specimen: Anterior Nasal Swab  Result Value Ref Range Status   MRSA by PCR Next Gen NOT DETECTED NOT DETECTED Final    Comment: (NOTE) The GeneXpert MRSA Assay (FDA approved for NASAL specimens only), is one component of a comprehensive MRSA colonization surveillance program. It is not intended to diagnose MRSA infection nor to guide or monitor treatment for MRSA infections. Test performance is not FDA approved in patients less than 58 years old. Performed at [facility],  [address] [place], [place] [address]   Cath Tip Culture     Status: None (Preliminary result)   Collection Time: 02/06/24 12:13 PM   Specimen: Catheter Tip; Other  Result Value Ref Range Status   Specimen Description CATH TIP  Final   Special Requests Normal  Final   Culture   Final    NO GROWTH 2 DAYS Performed at [facility], [address], [place], [place] [address]    Report Status PENDING  Incomplete    Radiology Report No results found.    Signature  -   Lynnwood Sauer M.D on 02/09/2024 at 8:36 AM   -  To page go to [email or web address]

## 2024-02-09 NOTE — Plan of Care (Signed)

## 2024-02-10 ENCOUNTER — Other Ambulatory Visit (HOSPITAL_COMMUNITY): Payer: Self-pay

## 2024-02-10 DIAGNOSIS — R6520 Severe sepsis without septic shock: Secondary | ICD-10-CM | POA: Diagnosis not present

## 2024-02-10 DIAGNOSIS — A419 Sepsis, unspecified organism: Secondary | ICD-10-CM | POA: Diagnosis not present

## 2024-02-10 LAB — RENAL FUNCTION PANEL
Albumin: 3.2 g/dL — ABNORMAL LOW (ref 3.5–5.0)
Anion gap: 9 (ref 5–15)
BUN: 35 mg/dL — ABNORMAL HIGH (ref 8–23)
CO2: 26 mmol/L (ref 22–32)
Calcium: 9 mg/dL (ref 8.9–10.3)
Chloride: 100 mmol/L (ref 98–111)
Creatinine, Ser: 5.18 mg/dL — ABNORMAL HIGH (ref 0.61–1.24)
GFR, Estimated: 11 mL/min — ABNORMAL LOW (ref >60)
Glucose, Bld: 60 mg/dL — ABNORMAL LOW (ref 70–99)
Phosphorus: 2.9 mg/dL (ref 2.5–4.6)
Potassium: 3.7 mmol/L (ref 3.5–5.1)
Sodium: 135 mmol/L (ref 135–145)

## 2024-02-10 LAB — CBC WITH DIFFERENTIAL/PLATELET
Abs Immature Granulocytes: 0.4 10*3/uL — ABNORMAL HIGH (ref 0.00–0.07)
Basophils Absolute: 0.1 10*3/uL (ref 0.0–0.1)
Basophils Relative: 1 %
Eosinophils Absolute: 0.4 10*3/uL (ref 0.0–0.5)
Eosinophils Relative: 4 %
HCT: 32 % — ABNORMAL LOW (ref 39.0–52.0)
Hemoglobin: 10.6 g/dL — ABNORMAL LOW (ref 13.0–17.0)
Lymphocytes Relative: 23 %
Lymphs Abs: 2.3 10*3/uL (ref 0.7–4.0)
MCH: 29.7 pg (ref 26.0–34.0)
MCHC: 33.1 g/dL (ref 30.0–36.0)
MCV: 89.6 fL (ref 80.0–100.0)
Monocytes Absolute: 0.2 10*3/uL (ref 0.1–1.0)
Monocytes Relative: 2 %
Myelocytes: 4 %
Neutro Abs: 6.6 10*3/uL (ref 1.7–7.7)
Neutrophils Relative %: 66 %
Platelets: 303 10*3/uL (ref 150–400)
RBC: 3.57 MIL/uL — ABNORMAL LOW (ref 4.22–5.81)
RDW: 14.4 % (ref 11.5–15.5)
WBC: 10 10*3/uL (ref 4.0–10.5)
nRBC: 0 % (ref 0.0–0.2)
nRBC: 0 /100{WBCs}

## 2024-02-10 LAB — C-REACTIVE PROTEIN: CRP: 1.7 mg/dL — ABNORMAL HIGH (ref <1.0)

## 2024-02-10 LAB — PROCALCITONIN: Procalcitonin: 1.03 ng/mL

## 2024-02-10 MED ORDER — CALCITRIOL 0.25 MCG PO CAPS
1.2500 ug | ORAL_CAPSULE | ORAL | 0 refills | Status: AC
  Filled 2024-02-10: qty 30, 14d supply, fill #0

## 2024-02-10 MED ORDER — CINACALCET HCL 30 MG PO TABS
30.0000 mg | ORAL_TABLET | ORAL | 0 refills | Status: AC
  Filled 2024-02-10: qty 40, 90d supply, fill #0

## 2024-02-10 NOTE — Progress Notes (Signed)
 D/C order noted. Contacted [facility] to be advised of pt's d/c today and that pt should resume care tomorrow.   Olivia Canter Renal Navigator [phone]

## 2024-02-10 NOTE — TOC Transition Note (Signed)
 Transition of Care [facility]) - Discharge Note   Patient Details  Name: Manuel Ortega MRN: [id number] Date of Birth: 1951-01-03  Transition of Care [facility]) CM/SW Contact:  Eusebio High, RN Phone Number: 02/10/2024, 10:28 AM   Clinical Narrative:     Patient will DC to home today . No TOC needs have been identified. Patient will follow up as directed on AVS            Patient Goals and CMS Choice            Discharge Placement                       Discharge Plan and Services Additional resources added to the After Visit Summary for                                       Social Drivers of Health (SDOH) Interventions SDOH Screenings   Food Insecurity: No Food Insecurity (02/05/2024)  Housing: Low Risk  (02/05/2024)  Transportation Needs: No Transportation Needs (02/05/2024)  Utilities: Not At Risk (02/05/2024)  Alcohol Screen: Low Risk  (01/22/2024)  Depression (PHQ2-9): Low Risk  (01/22/2024)  Financial Resource Strain: Low Risk  (11/10/2023)   Received from [facility]  Physical Activity: Sufficiently Active (08/21/2022)   Received from [facility]  Social Connections: Moderately Isolated (02/05/2024)  Stress: No Stress Concern Present (02/02/2024)   Received from [facility]  Tobacco Use: Low Risk  (02/06/2024)  Health Literacy: Adequate Health Literacy (01/22/2024)     Readmission Risk Interventions    01/18/2024    4:03 PM  Readmission Risk Prevention Plan  Transportation Screening Complete  PCP or Specialist Appt within 5-7 Days Complete  Home Care Screening Complete  Medication Review (RN CM) Referral to Pharmacy

## 2024-02-10 NOTE — Discharge Summary (Signed)
 Physician Discharge Summary  Manuel Ortega WUJ:[id number] DOB: [birth year] DOA: 02/04/2024  PCP: Patient, No Pcp Per  Admit date: 02/04/2024 Discharge date: 02/10/2024  Admitted From: (Home) Disposition:  (Home )  Recommendations for Outpatient Follow-up:  Follow up with PCP in 1-2 weeks Please follow on final results of blood culture and catheter tip cultures. Follow up outpatient for hearing/ear concerns with Audiology/ENT.  Follow-up with pulmonary as an outpatient, ambulatory referral has been sent.   Diet recommendation: renal diet  Brief/Interim Summary:   73 y.o. male with medical history significant for glaucoma, hypertension, prostate cancer, ESRD on Tuesday Thursday Saturday dialysis being admitted to the hospital with concern for sepsis of unclear etiology.  He was recently hospitalized at [facility] earlier this month, for sepsis felt to be due to possible pyelonephritis.  He has been doing well, tolerating his dialysis, however yesterday he started feeling generally unwell, weak, vague nausea.  Today he went to dialysis, was feeling very very cold and chilled, says that he normally does feel cold and dialysis center, but when he comes out of there he really feels better.  Today he did not.  He cut his dialysis session short by 1 hour since he was not feeling well.    He was admitted for fever during HD x 2 in the last few weeks, mild leukocytosis, no clear source of infection.  No clear source could be identified, ID were consulted, concern for HD line infection, so tunneled HD cath was discontinued, patient was kept on line holiday, he was on IV antibiotics during hospital stay, line was reinserted yesterday, where he was dialyzed, blood cultures remain negative, catheter tip culture remains negative as of today, patient is afebrile, stable, he will be discharged without antibiotics per ID recommendations.   Sepsis, possibly related to HD line infection. -meeting criteria  with tachycardia, leukocytosis, possibly due to acute infection given shaking chills.  Lactic acid is elevated to 2.9.  Etiology of his infection is unclear at the time.  He would be at risk for line infection, recurrent UTI, bacteremia.  Right-sided ear fullness with right frontal sinus fullness.  Likely seasonal allergies and blocked eustachian tube, does have some cerumen on exam in the right ear canal.  No tenderness on palpation on the right mastoid, right frontal and maxillary area, no tenderness on manipulation of his right pinna.  Chest and abdominal exam benign, no skin rashes or bruises no joint pains or aches.   Of note patient has had a right subclavian HD catheter for 3 years, he has had 2 episodes of rigors, fevers with HD treatments in the last few weeks.  Suspicious for possible infection of his HD catheter, initially he was resistant to catheter removal, has had the catheter for 3 years.  Now agreeable for catheter removal, nephrology, ID and IR on board.  Catheter was removed on 02/06/2024 by IR, line holiday for 2 days replacement of new catheter on 02/09/2024.  He was dialyzed yesterday, discussed with ID and renal, both okay with him going home today to resume his dialysis on TTS schedule, without antibiotics, blood cultures remains negative as of today, as well as catheter tip culture, please follow on final results.      ESRD-on TTS schedule,   Pancreatic insufficiency-continue Creon    Depression/anxiety-his home medications will be resumed once reconciled   HX of lung nodule.  Will require outpatient pulmonary follow-up within a month of discharge.   Some chronic mild right ear fullness  and pain.  Exam stable, has a lot of cerumen, outpatient ENT follow-up.    Discharge Diagnoses:  Principal Problem:   Sepsis (HCC) Active Problems:   Essential hypertension   Anemia in chronic kidney disease   Fever    Discharge Instructions  Discharge Instructions     Diet - low  sodium heart healthy   Complete by: As directed    Increase activity slowly   Complete by: As directed    No wound care   Complete by: As directed    Pulmonary Visit   Complete by: As directed    Reason for referral: Lung Mass/Lung Nodule   Does the patient's CT scan have any urgent finds? ("lung mass" (3+cm), suspected "metastatic disease," nodules >52mm with high-risk characteristics such as "spiculation, pleural tenting, adenopathy," or non-calcified nodule >30mm): No      Allergies as of 02/10/2024   No Known Allergies      Medication List     TAKE these medications    calcitRIOL  0.25 MCG capsule Commonly known as: ROCALTROL  Take 5 capsules (1.25 mcg total) by mouth Every Tuesday,Thursday,and Saturday with dialysis. Start taking on: Feb 11, 2024   cinacalcet  30 MG tablet Commonly known as: SENSIPAR  Take 1 tablet (30 mg total) by mouth Every Tuesday,Thursday,and Saturday with dialysis. Start taking on: Feb 11, 2024   Creon  36000-114000 units Cpep capsule Generic drug: lipase/protease/amylase Take 36,000-72,000 Units by mouth See admin instructions. Take 72,000 units by mouth three time a day with meals and 36,000 units three times a day with snacks   diphenhydrAMINE  25 mg capsule Commonly known as: BENADRYL  Take 25 mg by mouth every 4 (four) hours as needed for itching or allergies.   ferrous sulfate  325 (65 FE) MG EC tablet Take 325 mg by mouth daily with breakfast.   Lumigan  0.01 % Soln Generic drug: bimatoprost  Place 1 drop into both eyes at bedtime.   mirtazapine  15 MG tablet Commonly known as: REMERON  Take 15 mg by mouth daily.   montelukast  10 MG tablet Commonly known as: SINGULAIR  Take 10 mg by mouth daily.   pantoprazole  40 MG tablet Commonly known as: PROTONIX  Take 1 tablet (40 mg total) by mouth 2 (two) times daily.   QUEtiapine  Fumarate 150 MG 24 hr tablet Commonly known as: SEROQUEL  XR Take 300 mg by mouth at bedtime.   sertraline  100 MG  tablet Commonly known as: ZOLOFT  Take 100 mg by mouth in the morning and at bedtime.   sildenafil 100 MG tablet Commonly known as: VIAGRA Take 100 mg by mouth as needed for erectile dysfunction.   timolol  0.5 % ophthalmic solution Commonly known as: TIMOPTIC  Place 1 drop into both eyes every morning.   TYLENOL  500 MG tablet Generic drug: acetaminophen  Take 500-1,000 mg by mouth every 6 (six) hours as needed for mild pain (pain score 1-3) or headache.        Follow-up Information     [name], MD. Schedule an appointment as soon as possible for a visit in 1 week(s).   Specialties: Nephrology, Internal Medicine Contact information: [address] [place] [phone]         Your PCP. Schedule an appointment as soon as possible for a visit in 1 week(s).                 No Known Allergies  Consultations: ID, IR, renal   Procedures/Studies: IR Fluoro Guide CV Line Right Result Date: 02/09/2024 INDICATION: 73 year old male with  history of end-stage renal disease on hemodialysis presenting for tunneled central line placement after line holiday. EXAM: TUNNELED CENTRAL VENOUS HEMODIALYSIS CATHETER PLACEMENT WITH ULTRASOUND AND FLUOROSCOPIC GUIDANCE MEDICATIONS: Ancef 2 gm IV . The antibiotic was given in an appropriate time interval prior to skin puncture. ANESTHESIA/SEDATION: Moderate (conscious) sedation was employed during this procedure. A total of Versed  1 mg and Fentanyl  50 mcg was administered intravenously. Moderate Sedation Time: 10 minutes. The patient's level of consciousness and vital signs were monitored continuously by radiology nursing throughout the procedure under my direct supervision. FLUOROSCOPY TIME:  Two mGy COMPLICATIONS: None immediate. PROCEDURE: Informed written consent was obtained from the patient after a discussion of the risks, benefits, and alternatives to treatment. Questions regarding the procedure were encouraged and  answered. The right neck and chest were prepped with chlorhexidine  in a sterile fashion, and a sterile drape was applied covering the operative field. Maximum barrier sterile technique with sterile gowns and gloves were used for the procedure. A timeout was performed prior to the initiation of the procedure. After creating a small venotomy incision, a 21 gauge micropuncture kit was utilized to access the internal jugular vein. Real-time ultrasound guidance was utilized for vascular access including the acquisition of a permanent ultrasound image documenting patency of the accessed vessel. A Rosen wire was advanced to the level of the IVC and the micropuncture sheath was exchanged for an 8 Fr dilator. A 14.5 French tunneled hemodialysis catheter measuring 23 cm from tip to cuff was tunneled in a retrograde fashion from the anterior chest wall to the venotomy incision. Serial dilation was then performed an a peel-away sheath was placed. The catheter was then placed through the peel-away sheath with the catheter tip ultimately positioned within the right atrium. Final catheter positioning was confirmed and documented with a spot radiographic image. The catheter aspirates and flushes normally. The catheter was flushed with appropriate volume heparin  dwells. The catheter exit site was secured with a 0-Silk retention suture. The venotomy incision was closed with Dermabond. Sterile dressings were applied. The patient tolerated the procedure well without immediate post procedural complication. IMPRESSION: Successful placement of 23 cm tip to cuff tunneled hemodialysis catheter via the right internal jugular vein with catheter tip terminating within the right atrium. The catheter is ready for immediate use. Creasie Doctor, MD [facility] Radiology Electronically Signed   By: Creasie Doctor M.D.   On: 02/09/2024 14:24   IR US  Guide Vasc Access Right Result Date:  02/09/2024 INDICATION: 73 year old male with history of end-stage renal disease on hemodialysis presenting for tunneled central line placement after line holiday. EXAM: TUNNELED CENTRAL VENOUS HEMODIALYSIS CATHETER PLACEMENT WITH ULTRASOUND AND FLUOROSCOPIC GUIDANCE MEDICATIONS: Ancef 2 gm IV . The antibiotic was given in an appropriate time interval prior to skin puncture. ANESTHESIA/SEDATION: Moderate (conscious) sedation was employed during this procedure. A total of Versed  1 mg and Fentanyl  50 mcg was administered intravenously. Moderate Sedation Time: 10 minutes. The patient's level of consciousness and vital signs were monitored continuously by radiology nursing throughout the procedure under my direct supervision. FLUOROSCOPY TIME:  Two mGy COMPLICATIONS: None immediate. PROCEDURE: Informed written consent was obtained from the patient after a discussion of the risks, benefits, and alternatives to treatment. Questions regarding the procedure were encouraged and answered. The right neck and chest were prepped with chlorhexidine  in a sterile fashion, and a sterile drape was applied covering the operative field. Maximum barrier sterile technique with sterile gowns and gloves were used for  the procedure. A timeout was performed prior to the initiation of the procedure. After creating a small venotomy incision, a 21 gauge micropuncture kit was utilized to access the internal jugular vein. Real-time ultrasound guidance was utilized for vascular access including the acquisition of a permanent ultrasound image documenting patency of the accessed vessel. A Rosen wire was advanced to the level of the IVC and the micropuncture sheath was exchanged for an 8 Fr dilator. A 14.5 French tunneled hemodialysis catheter measuring 23 cm from tip to cuff was tunneled in a retrograde fashion from the anterior chest wall to the venotomy incision. Serial dilation was then performed an a peel-away sheath was placed. The catheter was  then placed through the peel-away sheath with the catheter tip ultimately positioned within the right atrium. Final catheter positioning was confirmed and documented with a spot radiographic image. The catheter aspirates and flushes normally. The catheter was flushed with appropriate volume heparin  dwells. The catheter exit site was secured with a 0-Silk retention suture. The venotomy incision was closed with Dermabond. Sterile dressings were applied. The patient tolerated the procedure well without immediate post procedural complication. IMPRESSION: Successful placement of 23 cm tip to cuff tunneled hemodialysis catheter via the right internal jugular vein with catheter tip terminating within the right atrium. The catheter is ready for immediate use. Creasie Doctor, MD [facility] Radiology Electronically Signed   By: Creasie Doctor M.D.   On: 02/09/2024 14:24   IR Removal Tun Cv Cath W/O FL Result Date: 02/07/2024 INDICATION: 73 year old male with sepsis. IR requested for tunneled HD catheter removal for line holiday. EXAM: REMOVAL TUNNELED CENTRAL VENOUS CATHETER MEDICATIONS: 5 mL of 1% lidocaine  ANESTHESIA/SEDATION: None. FLUOROSCOPY: none COMPLICATIONS: None immediate. PROCEDURE: Informed written consent was obtained from the patient after a thorough discussion of the procedural risks, benefits and alternatives. All questions were addressed. Maximal Sterile Barrier Technique was utilized including caps, mask, sterile gloves, sterile drape, hand hygiene and skin antiseptic. A timeout was performed prior to the initiation of the procedure. The patient's right chest and catheter was prepped and draped in a normal sterile fashion. 1% lidocaine  was used for local anesthesia. Using gentle blunt dissection the cuff of the catheter was exposed and the catheter was removed in it's entirety. Pressure was held till hemostasis was obtained. A sterile dressing was applied.  The patient tolerated the procedure well with no immediate complications. The catheter tip was sent for culture. IMPRESSION: Successful removal of a RIGHT chest tunneled dialysis catheter. Procedure performed by Lambert Pillion, PA-C Electronically Signed   By: Art Largo M.D.   On: 02/07/2024 11:43   DG Chest Port 1 View Result Date: 02/05/2024 CLINICAL DATA:  [id number] SOB (shortness of breath) [id number] EXAM: PORTABLE CHEST - 1 VIEW COMPARISON:  February 04, 2024 FINDINGS: No focal airspace consolidation, pleural effusion, or pneumothorax. No cardiomegaly. Right hemodialysis catheter terminates in the lower SVC. No acute fracture or destructive lesion. Multilevel thoracic osteophytosis. IMPRESSION: No significant interval change.  No pneumonia or pulmonary edema. Electronically Signed   By: Rance Burrows M.D.   On: 02/05/2024 08:27   CT SINUS WO CONTRAST Result Date: 02/04/2024 CLINICAL DATA:  72 year old male with fullness, discomfort around the right eye, sinuses. Right side inner ear pain. EXAM: CT MAXILLOFACIAL WITHOUT CONTRAST TECHNIQUE: Multidetector CT images of the paranasal sinuses were obtained using the standard protocol without intravenous contrast. RADIATION DOSE REDUCTION: This exam was performed according to the departmental dose-optimization program which includes automated  exposure control, adjustment of the mA and/or kV according to patient size and/or use of iterative reconstruction technique. COMPARISON:  Brain MRI 05/05/2023. PARANASAL SINUSES: PARANASAL SINUSES Mild motion artifact. Frontal: Chronically opacified right lateral frontal sinus. Right frontoethmoidal recess mucosal thickening is in an area of bubbly opacity on the MRI last year. Left frontal sinus and frontoethmoidal recess remain well aerated. Ethmoid: Well aerated. Maxillary: Chronic left maxillary sinusitis with heterogeneous mucoperiosteal thickening, subtotal sinus opacification which appears stable from last year.  Contralateral right maxillary sinus mild mucosal thickening, posterior alveolar recess small retention cysts. Sphenoid: Well aerated. Hyperplastic. Mild anterior right sphenoid sinus mucosal thickening and/or retention cysts. Right ostiomeatal unit: Patent on coronal image 33. Left ostiomeatal unit: Opacified. Nasal passages: Nasal septum is intact with rightward septal deviation and spurring. Fairly symmetric bilateral nasal cavity mucosal thickening and mild retained secretions in the inferior meatus. Anatomy: Mild motion artifact. Anterior modal artery position suspected on coronal image 35 and 36 with pneumatization above both notches. Symmetric olfactory grooves and fovea ethmoidalis, Keros II (4-87mm). Sellar sphenoid pneumatization pattern. No clinoid process pneumatization. No Onodi cell. Other: Visible brain parenchyma appears stable from the MRI last year, negative for age. Calcified atherosclerosis at the skull base. Visualized orbits and scalp soft tissues are within normal limits. Negative visible noncontrast deep soft tissue spaces of the face. No acute maxillary dental disease, absent posterior maxillary dentition. Normal bone mineralization at the skull base. Bilateral tympanic cavities and mastoids are clear. There is bilateral external auditory canal debris or cerumen which appears greater on the right. IMPRESSION: 1. Chronic paranasal sinus disease, most pronounced in the left maxillary sinus. No significant change compared to head MRI 05/05/2023. 2. Symmetric nasal cavity mucosal thickening raising the possibility of Rhinitis. Rightward nasal septal deviation and spurring. 3. Bilateral middle ears and mastoids are clear. But there is right greater than left external auditory canal debris or cerumen,. Electronically Signed   By: Marlise Simpers M.D.   On: 02/04/2024 17:03   DG Chest Portable 1 View Result Date: 02/04/2024 CLINICAL DATA:  Fever EXAM: PORTABLE CHEST 1 VIEW COMPARISON:  January 16, 2024  FINDINGS: The heart size and mediastinal contours are within normal limits. Both lungs are clear. The visualized skeletal structures are unremarkable. January 16, 2024 no change right IJ dialysis catheter IMPRESSION: No active disease. Electronically Signed   By: Fredrich Jefferson M.D.   On: 02/04/2024 13:14   DG Foot Complete Left Result Date: 01/18/2024 CLINICAL DATA:  Foot pain. EXAM: LEFT FOOT - COMPLETE 3+ VIEW COMPARISON:  None Available. FINDINGS: There is no evidence of fracture or dislocation. Slight hammertoe deformity of the fifth toe. No erosive change or periostitis. Soft tissue thickening about the medial first metatarsal phalangeal joint. No soft tissue gas or radiopaque foreign body. IMPRESSION: 1. Soft tissue thickening about the medial first metatarsophalangeal joint. No acute osseous abnormality. 2. Slight hammertoe deformity of the fifth toe. Electronically Signed   By: Chadwick Colonel M.D.   On: 01/18/2024 15:10   CT CHEST ABDOMEN PELVIS W CONTRAST Result Date: 01/16/2024 CLINICAL DATA:  Sepsis EXAM: CT CHEST, ABDOMEN, AND PELVIS WITH CONTRAST TECHNIQUE: Multidetector CT imaging of the chest, abdomen and pelvis was performed following the standard protocol during bolus administration of intravenous contrast. RADIATION DOSE REDUCTION: This exam was performed according to the departmental dose-optimization program which includes automated exposure control, adjustment of the mA and/or kV according to patient size and/or use of iterative reconstruction technique. CONTRAST:  75mL OMNIPAQUE  IOHEXOL   350 MG/ML SOLN COMPARISON:  None Available. FINDINGS: CT CHEST FINDINGS Cardiovascular: Mild coronary artery calcification. Global cardiac size within normal limits. No pericardial effusion. Central pulmonary arteries are of normal caliber. Mild atherosclerotic calcification within the thoracic aorta. No aortic aneurysm. Right internal jugular hemodialysis catheter tip noted within the superior vena cava.  Mediastinum/Nodes: No enlarged mediastinal, hilar, or axillary lymph nodes. Thyroid gland, trachea, and esophagus demonstrate no significant findings. Lungs/Pleura: Stable 6 mm noncalcified pulmonary nodule within the left lower lobe, axial image # 96/4. Stable 6 mm subpleural right lower lobe pulmonary nodule when accounting for partial atelectasis of the right lower lobe, axial image # 103/4. Lungs a otherwise re clear. No pleural effusion or pneumothorax. Musculoskeletal: No chest wall mass or suspicious bone lesions identified. CT ABDOMEN PELVIS FINDINGS Hepatobiliary: Stable indeterminate low-attenuation lesions within the a patent dome since remote prior examination of 05/11/2020 likely representing cysts given their stability over time. Liver otherwise unremarkable. Gallbladder unremarkable. No intra or extrahepatic biliary ductal dilation. Pancreas: Stable 16 mm cystic lesion within the uncinate process of the pancreas demonstrating coarse calcification possibly the sequela of remote pancreatitis or a complex cystic neoplasm. Pancreas is otherwise unremarkable. Spleen: Unremarkable Adrenals/Urinary Tract: The adrenal glands are unremarkable. The kidneys are normal in position. Mild bilateral renal cortical atrophy, unchanged. Simple cortical cyst noted within the lower pole of the left kidney for which no follow-up imaging is recommended. Stable moderate nonspecific perinephric stranding. No hydronephrosis. No intrarenal or ureteral calculi. The bladder demonstrates wall trabeculation likely related to changes of chronic bladder outlet obstruction. TURP defect noted within the central prostate gland. Stomach/Bowel: Moderate cecal and sigmoid diverticulosis. Stomach, small bowel, and large bowel are otherwise unremarkable. Appendix normal. No evidence of obstruction or focal inflammation. No free intraperitoneal gas or fluid. Vascular/Lymphatic: Aortic atherosclerosis. No enlarged abdominal or pelvic lymph  nodes. Reproductive: The prostate gland is not enlarged. TURP defect noted. Other: No abdominal wall hernia Musculoskeletal: No acute bone abnormality. No lytic or blastic bone lesion. IMPRESSION: 1. No acute intrathoracic or intra-abdominal pathology identified. No definite radiographic explanation for the patient's reported symptoms. 2. Mild coronary artery calcification. 3. Stable 6 mm noncalcified pulmonary nodules within the lower lobes bilaterally. Follow-up evaluation in 12-18 months is recommended to confirm stability and an underlying benign etiology. 4. Stable 16 mm cystic lesion within the uncinate process of the pancreas demonstrating coarse calcification possibly the sequela of remote pancreatitis or a complex cystic neoplasm. Follow-up MRI examination in 1 year is recommended to assess for stability. 5. Moderate cecal and sigmoid diverticulosis. 6. Stable moderate nonspecific perinephric stranding. Correlation with urinalysis may be helpful to exclude the possibility of pyelonephritis. Aortic Atherosclerosis (ICD10-I70.0). Electronically Signed   By: Worthy Heads M.D.   On: 01/16/2024 23:48   DG Chest Port 1 View Result Date: 01/16/2024 CLINICAL DATA:  Questionable sepsis - evaluate for abnormality Lightheaded.  Dizzy. EXAM: PORTABLE CHEST 1 VIEW COMPARISON:  None Available. FINDINGS: Right-sided dialysis catheter tip overlies the lower SVC. The heart is upper normal in size. Aortic atherosclerosis. Mild atelectasis in both lung bases. No confluent airspace disease. No pulmonary edema, large pleural effusion or evidence of pneumothorax. IMPRESSION: Mild bibasilar atelectasis. Electronically Signed   By: Chadwick Colonel M.D.   On: 01/16/2024 17:10      Subjective: Denies any complaints today, eager to go home.  Discharge Exam: Vitals:   02/10/24 0340 02/10/24 0856  BP: 116/74 116/63  Pulse: 79   Resp: 18   Temp: Aaron Aas)  97.4 F (36.3 C) 98.1 F (36.7 C)  SpO2: 97%    Vitals:    02/09/24 2010 02/09/24 2338 02/10/24 0340 02/10/24 0856  BP: (!) 150/83 116/66 116/74 116/63  Pulse: 72 86 79   Resp:  16 18   Temp: 98.1 F (36.7 C) 97.9 F (36.6 C) (!) 97.4 F (36.3 C) 98.1 F (36.7 C)  TempSrc: Oral Oral Oral Oral  SpO2: 96% 97% 97%   Weight:      Height:        General: Pt is alert, awake, not in acute distress Cardiovascular: RRR, S1/S2 +, no rubs, no gallops Respiratory: CTA bilaterally, no wheezing, no rhonchi Abdominal: Soft, NT, ND, bowel sounds + Extremities: no edema, no cyanosis    The results of significant diagnostics from this hospitalization (including imaging, microbiology, ancillary and laboratory) are listed below for reference.     Microbiology: Recent Results (from the past 240 hours)  Culture, blood (Routine x 2)     Status: None   Collection Time: 02/04/24 11:20 AM   Specimen: BLOOD  Result Value Ref Range Status   Specimen Description   Final    BLOOD RIGHT ANTECUBITAL Performed at [facility], [address], [place], [place] [address]    Special Requests   Final    BOTTLES DRAWN AEROBIC ONLY Blood Culture results may not be optimal due to an inadequate volume of blood received in culture bottles Performed at [facility], [address], [place], [place] [address]    Culture   Final    NO GROWTH 5 DAYS Performed at [facility], [address], [place], [place] [address]    Report Status 02/09/2024 FINAL  Final  Culture, blood (Routine x 2)     Status: None   Collection Time: 02/04/24 11:20 AM   Specimen: BLOOD  Result Value Ref Range Status   Specimen Description   Final    BLOOD LEFT ANTECUBITAL Performed at [facility], [address] [place], [place] [address]    Special Requests   Final    BOTTLES DRAWN AEROBIC AND ANAEROBIC Blood Culture results may not be optimal due to an inadequate volume of blood received in culture bottles Performed at [facility], [address], [place], [place] [address]    Culture   Final    NO GROWTH 5 DAYS Performed at [facility], [address], [place], [place] [address]    Report Status 02/09/2024 FINAL  Final  Resp panel by RT-PCR (RSV, Flu A&B, Covid) Anterior Nasal Swab     Status: None   Collection Time: 02/04/24 11:50 AM   Specimen: Anterior Nasal Swab  Result Value Ref Range Status   SARS Coronavirus 2 by RT PCR NEGATIVE NEGATIVE Final    Comment: (NOTE) SARS-CoV-2 target nucleic acids are NOT DETECTED.  The SARS-CoV-2 RNA is generally detectable in upper respiratory specimens during the acute phase of infection. The lowest concentration of SARS-CoV-2 viral copies this assay can detect is 138 copies/mL. A negative result does not preclude SARS-Cov-2 infection and should not be used as the sole basis for treatment or other patient management decisions. A negative result may occur with  improper specimen collection/handling, submission of specimen other than nasopharyngeal swab, presence of viral mutation(s) within the areas targeted by this assay, and inadequate number of viral copies(<138 copies/mL). A negative result must be combined with clinical observations, patient history, and epidemiological  information. The expected result is Negative.  Fact Sheet for Patients:  BloggerCourse.com  Fact Sheet for Healthcare Providers:  SeriousBroker.it  This test is no t yet approved or cleared by the [place]  FDA and  has been authorized for detection and/or diagnosis of SARS-CoV-2 by FDA under an Emergency Use Authorization (EUA). This EUA will remain  in effect (meaning this test can be used) for the duration of the COVID-19 declaration under Section 564(b)(1) of the Act, 21 U.S.C.section 360bbb-3(b)(1), unless the authorization is terminated  or revoked sooner.       Influenza A by PCR NEGATIVE NEGATIVE  Final   Influenza B by PCR NEGATIVE NEGATIVE Final    Comment: (NOTE) The Xpert Xpress SARS-CoV-2/FLU/RSV plus assay is intended as an aid in the diagnosis of influenza from Nasopharyngeal swab specimens and should not be used as a sole basis for treatment. Nasal washings and aspirates are unacceptable for Xpert Xpress SARS-CoV-2/FLU/RSV testing.  Fact Sheet for Patients: BloggerCourse.com  Fact Sheet for Healthcare Providers: SeriousBroker.it  This test is not yet approved or cleared by the [place]  FDA and has been authorized for detection and/or diagnosis of SARS-CoV-2 by FDA under an Emergency Use Authorization (EUA). This EUA will remain in effect (meaning this test can be used) for the duration of the COVID-19 declaration under Section 564(b)(1) of the Act, 21 U.S.C. section 360bbb-3(b)(1), unless the authorization is terminated or revoked.     Resp Syncytial Virus by PCR NEGATIVE NEGATIVE Final    Comment: (NOTE) Fact Sheet for Patients: BloggerCourse.com  Fact Sheet for Healthcare Providers: SeriousBroker.it  This test is not yet approved or cleared by the [place]  FDA and has been authorized for detection and/or diagnosis of SARS-CoV-2 by FDA under an Emergency Use Authorization (EUA). This EUA will remain in effect (meaning this test can be used) for the duration of the COVID-19 declaration under Section 564(b)(1) of the Act, 21 U.S.C. section 360bbb-3(b)(1), unless the authorization is terminated or revoked.  Performed at [facility], [address], [place], [place] [address]   Resp panel by RT-PCR (RSV, Flu A&B, Covid) Anterior Nasal Swab     Status: None   Collection Time: 02/05/24  7:19 AM   Specimen: Anterior Nasal Swab  Result Value Ref Range Status   SARS Coronavirus 2 by RT PCR NEGATIVE NEGATIVE Final   Influenza A by PCR  NEGATIVE NEGATIVE Final   Influenza B by PCR NEGATIVE NEGATIVE Final    Comment: (NOTE) The Xpert Xpress SARS-CoV-2/FLU/RSV plus assay is intended as an aid in the diagnosis of influenza from Nasopharyngeal swab specimens and should not be used as a sole basis for treatment. Nasal washings and aspirates are unacceptable for Xpert Xpress SARS-CoV-2/FLU/RSV testing.  Fact Sheet for Patients: BloggerCourse.com  Fact Sheet for Healthcare Providers: SeriousBroker.it  This test is not yet approved or cleared by the [place]  FDA and has been authorized for detection and/or diagnosis of SARS-CoV-2 by FDA under an Emergency Use Authorization (EUA). This EUA will remain in effect (meaning this test can be used) for the duration of the COVID-19 declaration under Section 564(b)(1) of the Act, 21 U.S.C. section 360bbb-3(b)(1), unless the authorization is terminated or revoked.     Resp Syncytial Virus by PCR NEGATIVE NEGATIVE Final    Comment: (NOTE) Fact Sheet for Patients: BloggerCourse.com  Fact Sheet for Healthcare Providers: SeriousBroker.it  This test is not yet approved or cleared by the [place]  FDA and has been authorized for  detection and/or diagnosis of SARS-CoV-2 by FDA under an Emergency Use Authorization (EUA). This EUA will remain in effect (meaning this test can be used) for the duration of the COVID-19 declaration under Section 564(b)(1) of the Act, 21 U.S.C. section 360bbb-3(b)(1), unless the authorization is terminated or revoked.  Performed at [facility], [address] [place], [place] [address]   MRSA Next Gen by PCR, Nasal     Status: None   Collection Time: 02/05/24  7:37 AM   Specimen: Anterior Nasal Swab  Result Value Ref Range Status   MRSA by PCR Next Gen NOT DETECTED NOT DETECTED Final    Comment: (NOTE) The GeneXpert MRSA Assay (FDA  approved for NASAL specimens only), is one component of a comprehensive MRSA colonization surveillance program. It is not intended to diagnose MRSA infection nor to guide or monitor treatment for MRSA infections. Test performance is not FDA approved in patients less than 11 years old. Performed at [facility] [place] [facility], [address], [place], [place] [address]   Cath Tip Culture     Status: None   Collection Time: 02/06/24 12:13 PM   Specimen: Catheter Tip; Other  Result Value Ref Range Status   Specimen Description CATH TIP  Final   Special Requests Normal  Final   Culture   Final    NO GROWTH 2 DAYS Performed at [facility], [address] [place], [place] [address]    Report Status 02/09/2024 FINAL  Final     Labs: BNP (last 3 results) No results for input(s): "BNP" in the last 8760 hours. Basic Metabolic Panel: Recent Labs  Lab 02/04/24 1120 02/05/24 0742 02/09/24 0326 02/10/24 0317  NA 136 138 136 135  K 3.3* 4.0 3.9 3.7  CL 101 101 103 100  CO2 22 23 20* 26  GLUCOSE 108* 97 77 60*  BUN 50* 56* 78* 35*  CREATININE 5.66* 7.27* 8.47* 5.18*  CALCIUM  9.4 9.1 9.2 9.0  PHOS  --  3.7 4.3 2.9   Liver Function Tests: Recent Labs  Lab 02/04/24 1120 02/09/24 0326 02/10/24 0317  AST 18  --   --   ALT 10  --   --   ALKPHOS 66  --   --   BILITOT 0.6  --   --   PROT 7.5  --   --   ALBUMIN  4.0 3.0* 3.2*   Recent Labs  Lab 02/04/24 1128  LIPASE 99*   No results for input(s): "AMMONIA" in the last 168 hours. CBC: Recent Labs  Lab 02/05/24 0742 02/07/24 0643 02/08/24 0334 02/09/24 0326 02/10/24 0317  WBC 9.3 8.3 10.3 8.6 10.0  NEUTROABS 5.9 4.7 5.9 4.4 6.6  HGB 10.4* 10.5* 9.8* 9.7* 10.6*  HCT 31.2* 31.6* 30.4* 29.1* 32.0*  MCV 90.2 89.8 91.6 89.3 89.6  PLT 237 241 274 288 303   Cardiac Enzymes: No results for input(s): "CKTOTAL", "CKMB", "CKMBINDEX", "TROPONINI" in the last 168 hours. BNP: Invalid input(s): "POCBNP" CBG: No results for  input(s): "GLUCAP" in the last 168 hours. D-Dimer No results for input(s): "DDIMER" in the last 72 hours. Hgb A1c No results for input(s): "HGBA1C" in the last 72 hours. Lipid Profile No results for input(s): "CHOL", "HDL", "LDLCALC", "TRIG", "CHOLHDL", "LDLDIRECT" in the last 72 hours. Thyroid function studies No results for input(s): "TSH", "T4TOTAL", "T3FREE", "THYROIDAB" in the last 72 hours.  Invalid input(s): "FREET3" Anemia work up No results for input(s): "VITAMINB12", "FOLATE", "FERRITIN", "TIBC", "IRON ", "RETICCTPCT" in  the last 72 hours. Urinalysis    Component Value Date/Time   COLORURINE STRAW (A) 02/04/2024 2010   APPEARANCEUR CLEAR 02/04/2024 2010   LABSPEC 1.009 02/04/2024 2010   PHURINE 8.0 02/04/2024 2010   GLUCOSEU NEGATIVE 02/04/2024 2010   HGBUR SMALL (A) 02/04/2024 2010   BILIRUBINUR NEGATIVE 02/04/2024 2010   KETONESUR NEGATIVE 02/04/2024 2010   PROTEINUR 100 (A) 02/04/2024 2010   NITRITE NEGATIVE 02/04/2024 2010   LEUKOCYTESUR NEGATIVE 02/04/2024 2010   Sepsis Labs Recent Labs  Lab 02/07/24 0643 02/08/24 0334 02/09/24 0326 02/10/24 0317  WBC 8.3 10.3 8.6 10.0   Microbiology Recent Results (from the past 240 hours)  Culture, blood (Routine x 2)     Status: None   Collection Time: 02/04/24 11:20 AM   Specimen: BLOOD  Result Value Ref Range Status   Specimen Description   Final    BLOOD RIGHT ANTECUBITAL Performed at [facility], [address], [place], [place] [address]    Special Requests   Final    BOTTLES DRAWN AEROBIC ONLY Blood Culture results may not be optimal due to an inadequate volume of blood received in culture bottles Performed at [facility], [address] [place], [place] [address]    Culture   Final    NO GROWTH 5 DAYS Performed at [facility], [address], [place], [place] [address]    Report Status 02/09/2024 FINAL  Final  Culture, blood (Routine x 2)     Status: None    Collection Time: 02/04/24 11:20 AM   Specimen: BLOOD  Result Value Ref Range Status   Specimen Description   Final    BLOOD LEFT ANTECUBITAL Performed at [facility], [address] [place], [place] [address]    Special Requests   Final    BOTTLES DRAWN AEROBIC AND ANAEROBIC Blood Culture results may not be optimal due to an inadequate volume of blood received in culture bottles Performed at [facility], [address], [place], [place] [address]    Culture   Final    NO GROWTH 5 DAYS Performed at [facility], [address] [place], [place] [address]    Report Status 02/09/2024 FINAL  Final  Resp panel by RT-PCR (RSV, Flu A&B, Covid) Anterior Nasal Swab     Status: None   Collection Time: 02/04/24 11:50 AM   Specimen: Anterior Nasal Swab  Result Value Ref Range Status   SARS Coronavirus 2 by RT PCR NEGATIVE NEGATIVE Final    Comment: (NOTE) SARS-CoV-2 target nucleic acids are NOT DETECTED.  The SARS-CoV-2 RNA is generally detectable in upper respiratory specimens during the acute phase of infection. The lowest concentration of SARS-CoV-2 viral copies this assay can detect is 138 copies/mL. A negative result does not preclude SARS-Cov-2 infection and should not be used as the sole basis for treatment or other patient management decisions. A negative result may occur with  improper specimen collection/handling, submission of specimen other than nasopharyngeal swab, presence of viral mutation(s) within the areas targeted by this assay, and inadequate number of viral copies(<138 copies/mL). A negative result must be combined with clinical observations, patient history, and epidemiological information. The expected result is Negative.  Fact Sheet for Patients:  BloggerCourse.com  Fact Sheet for Healthcare Providers:  SeriousBroker.it  This test is no t yet approved or cleared by  the [place]  FDA and  has been authorized for detection and/or diagnosis of SARS-CoV-2 by  FDA under an Emergency Use Authorization (EUA). This EUA will remain  in effect (meaning this test can be used) for the duration of the COVID-19 declaration under Section 564(b)(1) of the Act, 21 U.S.C.section 360bbb-3(b)(1), unless the authorization is terminated  or revoked sooner.       Influenza A by PCR NEGATIVE NEGATIVE Final   Influenza B by PCR NEGATIVE NEGATIVE Final    Comment: (NOTE) The Xpert Xpress SARS-CoV-2/FLU/RSV plus assay is intended as an aid in the diagnosis of influenza from Nasopharyngeal swab specimens and should not be used as a sole basis for treatment. Nasal washings and aspirates are unacceptable for Xpert Xpress SARS-CoV-2/FLU/RSV testing.  Fact Sheet for Patients: BloggerCourse.com  Fact Sheet for Healthcare Providers: SeriousBroker.it  This test is not yet approved or cleared by the [place]  FDA and has been authorized for detection and/or diagnosis of SARS-CoV-2 by FDA under an Emergency Use Authorization (EUA). This EUA will remain in effect (meaning this test can be used) for the duration of the COVID-19 declaration under Section 564(b)(1) of the Act, 21 U.S.C. section 360bbb-3(b)(1), unless the authorization is terminated or revoked.     Resp Syncytial Virus by PCR NEGATIVE NEGATIVE Final    Comment: (NOTE) Fact Sheet for Patients: BloggerCourse.com  Fact Sheet for Healthcare Providers: SeriousBroker.it  This test is not yet approved or cleared by the [place]  FDA and has been authorized for detection and/or diagnosis of SARS-CoV-2 by FDA under an Emergency Use Authorization (EUA). This EUA will remain in effect (meaning this test can be used) for the duration of the COVID-19 declaration under Section 564(b)(1) of the Act, 21  U.S.C. section 360bbb-3(b)(1), unless the authorization is terminated or revoked.  Performed at [facility], [address], [place], [place] [address]   Resp panel by RT-PCR (RSV, Flu A&B, Covid) Anterior Nasal Swab     Status: None   Collection Time: 02/05/24  7:19 AM   Specimen: Anterior Nasal Swab  Result Value Ref Range Status   SARS Coronavirus 2 by RT PCR NEGATIVE NEGATIVE Final   Influenza A by PCR NEGATIVE NEGATIVE Final   Influenza B by PCR NEGATIVE NEGATIVE Final    Comment: (NOTE) The Xpert Xpress SARS-CoV-2/FLU/RSV plus assay is intended as an aid in the diagnosis of influenza from Nasopharyngeal swab specimens and should not be used as a sole basis for treatment. Nasal washings and aspirates are unacceptable for Xpert Xpress SARS-CoV-2/FLU/RSV testing.  Fact Sheet for Patients: BloggerCourse.com  Fact Sheet for Healthcare Providers: SeriousBroker.it  This test is not yet approved or cleared by the [place]  FDA and has been authorized for detection and/or diagnosis of SARS-CoV-2 by FDA under an Emergency Use Authorization (EUA). This EUA will remain in effect (meaning this test can be used) for the duration of the COVID-19 declaration under Section 564(b)(1) of the Act, 21 U.S.C. section 360bbb-3(b)(1), unless the authorization is terminated or revoked.     Resp Syncytial Virus by PCR NEGATIVE NEGATIVE Final    Comment: (NOTE) Fact Sheet for Patients: BloggerCourse.com  Fact Sheet for Healthcare Providers: SeriousBroker.it  This test is not yet approved or cleared by the [place]  FDA and has been authorized for detection and/or diagnosis of SARS-CoV-2 by FDA under an Emergency Use Authorization (EUA). This EUA will remain in effect (meaning this test can be used) for the duration of the COVID-19 declaration under Section 564(b)(1)  of the Act, 21 U.S.C. section 360bbb-3(b)(1), unless the authorization is terminated or  revoked.  Performed at [facility], [address], [place], [place] [address]   MRSA Next Gen by PCR, Nasal     Status: None   Collection Time: 02/05/24  7:37 AM   Specimen: Anterior Nasal Swab  Result Value Ref Range Status   MRSA by PCR Next Gen NOT DETECTED NOT DETECTED Final    Comment: (NOTE) The GeneXpert MRSA Assay (FDA approved for NASAL specimens only), is one component of a comprehensive MRSA colonization surveillance program. It is not intended to diagnose MRSA infection nor to guide or monitor treatment for MRSA infections. Test performance is not FDA approved in patients less than 14 years old. Performed at [facility], [address], [place], [place] [address]   Cath Tip Culture     Status: None   Collection Time: 02/06/24 12:13 PM   Specimen: Catheter Tip; Other  Result Value Ref Range Status   Specimen Description CATH TIP  Final   Special Requests Normal  Final   Culture   Final    NO GROWTH 2 DAYS Performed at [facility], [address] [place], [place] [address]    Report Status 02/09/2024 FINAL  Final     Time coordinating discharge: Over 30 minutes  SIGNED:   Seena Dadds, MD  [facility] 02/10/2024, 10:34 AM Pager   If 7PM-7AM, please contact night-coverage [email or web address] Password [email or web address]

## 2024-02-10 NOTE — Progress Notes (Signed)
 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  TDC removed 4/26. For new TDC per IR  4/29   WITHOUT ABX  NEG. growth      Last Hgb: 10.6  no esa  or bld    mircera per protocal  mcg IV q 2 weeks  IV Iron  dose at discharge: no  Heparin  change: no  EDW Change: no New EDW:   [place] Change: no  Access intervention/Change: IR Dr Jinx Mour[facility]  02/09/24  had 4/26 tdc removed  Details:  Hectorol/Calcitriol  change: no  Discharge Labs: Calcium9.0 Phosphorus 2.9 Albumin  3.2 K+ 37  IV Antibiotics: no Details:  On Coumadin?: no Last INR: Next INR: Managed By:   OTHER/APPTS/LAB ORDERS:    D/C Meds to be reconciled by nurse after every discharge.  Completed By:   Reviewed by: MD:______ RN_______

## 2024-02-10 NOTE — Plan of Care (Signed)

## 2024-02-11 ENCOUNTER — Telehealth: Payer: Self-pay

## 2024-02-11 NOTE — Transitions of Care (Post Inpatient/ED Visit) (Signed)
   02/11/2024  Name: Manuel Ortega MRN: [id number] DOB: 23-Jan-1951  Today's TOC FU Call Status: Today's TOC FU Call Status:: Unsuccessful Call (1st Attempt) Unsuccessful Call (1st Attempt) Date: 02/11/24  Attempted to reach the patient regarding the most recent Inpatient/ED visit.  Follow Up Plan: Additional outreach attempts will be made to reach the patient to complete the Transitions of Care (Post Inpatient/ED visit) call.   Gareld June, BSN, RN [place]  [facility]BCI - [employer] [facility] RN Care Manager [phone]

## 2024-02-12 ENCOUNTER — Telehealth: Payer: Self-pay

## 2024-02-12 ENCOUNTER — Telehealth: Payer: Self-pay | Admitting: Physician Assistant

## 2024-02-12 NOTE — Transitions of Care (Post Inpatient/ED Visit) (Signed)
 Transition of Care  Initial Follow Up Note  Visit Note  02/12/2024  Name: Manuel Ortega MRN: [id number]          DOB: 08/27/1951  Situation: Patient enrolled in [facility] 30-day program. Visit completed with [name] by telephone.   Background:     Past Medical History:  Diagnosis Date   AKI (acute kidney injury) (HCC) 05/2020   possibly due to prostate blockage   Anxiety    Arthritis    Depression    Dyspnea    Glaucoma    Hypertension    Prostate cancer [facility])    Renal disorder    Dialysis T/th/sat    Assessment: Patient Reported Symptoms: Cognitive Cognitive Status: Alert and oriented to person, place, and time   Health Maintenance Behaviors: Annual physical exam  Neurological Neurological Review of Symptoms: No symptoms reported    HEENT HEENT Symptoms Reported: No symptoms reported HEENT Conditions: Vision problem(s) Vision Problems: glaucoma HEENT Management Strategies: Medication therapy HEENT Self-Management Outcome: 4 (good) HEENT Comment: Stable. Still drives and works Vision problem(s)  Cardiovascular Cardiovascular Symptoms Reported: No symptoms reported Does patient have uncontrolled Hypertension?: No Cardiovascular Conditions: Hypertension Cardiovascular Management Strategies: Routine screening Weight: 205 lb (93 kg) Cardiovascular Self-Management Outcome: 4 (good)  Respiratory Respiratory Symptoms Reported: No symptoms reported    Endocrine Patient reports the following symptoms related to hypoglycemia or hyperglycemia : No symptoms reported Is patient diabetic?: No    Gastrointestinal Gastrointestinal Symptoms Reported: No symptoms reported   Nutrition Risk Screen (CP): No indicators present  Genitourinary Genitourinary Symptoms Reported: No symptoms reported Genitourinary Conditions: Unable to void/empty Genitourinary Management Strategies: Hemodialysis Hemodialysis Schedule: Jeannetta Millman, Saturday Hemodialysis Last Treatment:  02/11/24 Genitourinary Self-Management Outcome: 4 (good) Genitourinary Comment: Just had his HD catheter replaced  Integumentary Integumentary Symptoms Reported: Wound Additional Integumentary Details: HD access Skin Conditions: Wound Skin Comment: HD access  Musculoskeletal Musculoskelatal Symptoms Reviewed: No symptoms reported   Falls in the past year?: No Number of falls in past year: 1 or less Was there an injury with Fall?: No Fall Risk Category Calculator: 0 Patient Fall Risk Level: Low Fall Risk Patient at Risk for Falls Due to: No Fall Risks Fall risk Follow up: Falls evaluation completed  Psychosocial Psychosocial Symptoms Reported: No symptoms reported         There were no vitals filed for this visit.  Medications Reviewed Today     Reviewed by Claudene Crystal, RN (Case Manager) on 02/12/24 at 1443  Med List Status: <None>   Medication Order Taking? Sig Documenting Provider Last Dose Status Informant  calcitRIOL  (ROCALTROL ) 0.25 MCG capsule [id number]  Take 5 capsules (1.25 mcg total) by mouth Every Tuesday,Thursday,and Saturday with dialysis. Elgergawy, Ardia Kraft, MD  Active   cinacalcet  (SENSIPAR ) 30 MG tablet [id number]  Take 1 tablet (30 mg total) by mouth Every Tuesday,Thursday,and Saturday with dialysis. Elgergawy, Ardia Kraft, MD  Active   CREON  [phone] units CPEP capsule [id number] No Take 36,000-72,000 Units by mouth See admin instructions. Take 72,000 units by mouth three time a day with meals and 36,000 units three times a day with snacks [provider] 02/04/2024 Active Self, Pharmacy Records           Med Note Silvestre Drum, TYELISHA L   Thu Feb 04, 2024  6:05 PM) Patient stated he had one capsule on 02/04/24  diphenhydrAMINE  (BENADRYL ) 25 mg capsule [id number] No Take 25 mg by mouth every 4 (four) hours as needed for itching or allergies. [provider] 02/04/2024 Active Self, Pharmacy Records           Med Note [facility], TYELISHA L   Thu  Feb 04, 2024  6:06 PM) Patient requests this medication during dialysis .  ferrous sulfate  325 (65 FE) MG EC tablet [id number] No Take 325 mg by mouth daily with breakfast. [provider] Past Week Active Self, Pharmacy Records  LUMIGAN  0.01 % SOLN [id number] No Place 1 drop into both eyes at bedtime. [provider] Past Week Active Self, Pharmacy Records  mirtazapine  (REMERON ) 15 MG tablet [id number] No Take 15 mg by mouth daily. [provider] Past Month Active Self, Pharmacy Records           Med Note Silvestre Drum, TYELISHA L   Thu Feb 04, 2024  6:13 PM) Patient running out so has been taking scarcely  montelukast  (SINGULAIR ) 10 MG tablet [id number] No Take 10 mg by mouth daily. [provider] Unknown Active Self, Pharmacy Records  pantoprazole  (PROTONIX ) 40 MG tablet [id number] No Take 1 tablet (40 mg total) by mouth 2 (two) times daily. Macdonald Savoy, MD 02/03/2024 Active Self, Pharmacy Records  QUEtiapine  Fumarate (SEROQUEL  XR) 150 MG 24 hr tablet [id number] No Take 300 mg by mouth at bedtime. [provider] Past Week Active Self, Pharmacy Records           Med Note Silvestre Drum, Myrtle Atta   Thu Feb 04, 2024  6:11 PM) Patient stated this medication is supposed to be 200mg  but doctor prescribed him 150mg  and he has been taking 2 tablets. Medication running out and needs a refill.  sertraline  (ZOLOFT ) 100 MG tablet [id number] No Take 100 mg by mouth in the morning and at bedtime. [provider] 02/03/2024 Active Self, Pharmacy Records  sildenafil (VIAGRA) 100 MG tablet [id number] No Take 100 mg by mouth as needed for erectile dysfunction. [provider] Past Month Active Self, Pharmacy Records  timolol  (TIMOPTIC ) 0.5 % ophthalmic solution [id number] No Place 1 drop into both eyes every morning. [provider] 02/03/2024 Active Self, Pharmacy Records  TYLENOL  500 MG tablet [id number] No Take 500-1,000 mg by mouth every 6 (six)  hours as needed for mild pain (pain score 1-3) or headache. [provider] 02/04/2024 Active Self, Pharmacy Records  Med List Note Baltazar Leventhal, [place], CPhT 01/16/24 2201): Dialysis Tuesday, Thursday Saturday             Goals Addressed             This Visit's Progress    VBCI Transitions of Care (TOC) Care Plan       Problems:  Recent Hospitalization for treatment of ESRD None  Goal:  Over the next 30 days, the patient will not experience hospital readmission  Interventions:    Chronic Kidney Disease Interventions: Evaluation of current treatment plan related to chronic kidney disease self management and patient's adherence to plan as established by provider      Reviewed medications with patient and discussed importance of compliance    Reviewed scheduled/upcoming provider appointments including    Discussed plans with patient for ongoing care management follow up and provided patient with direct contact information for care management team    Screening for signs and symptoms of depression related to chronic disease state      Assessed social determinant of health barriers    Last practice recorded BP readings:  BP Readings from Last 3 Encounters:  02/10/24 116/63  02/04/24 (!) 164/86  01/22/24 134/77  Most recent eGFR/CrCl: No results found for: "EGFR"  No components found for: "CRCL"  Patient Self Care Activities:  Attend all scheduled provider appointments Call pharmacy for medication refills 3-7 days in advance of running out of medications Call provider office for new concerns or questions  Notify RN Care Manager of [facility] call rescheduling needs Participate in Transition of Care Program/Attend [facility] scheduled calls Perform all self care activities independently  Take medications as prescribed    Plan: TOC 30 Day Outreach week 2 scheduled for Friday May 9th at 2:00pm        Recommendation:   PCP Follow-up Scheduled for May 8th Call pharmacy for  refills Daily activity for strength improvement  Follow Up Plan:   Telephone follow-up in 1 week  Gareld June, BSN, RN [place]  VBCI - [facility] RN Care Manager [phone]

## 2024-02-12 NOTE — Patient Instructions (Signed)
 Visit Information  Thank you for taking time to visit with me today. Please don't hesitate to contact me if I can be of assistance to you before our next scheduled telephone appointment.  Our next appointment is by telephone on Friday May 9th at 2:00pm  Following is a copy of your care plan:   Goals Addressed             This Visit's Progress    VBCI Transitions of Care (TOC) Care Plan       Problems:  Recent Hospitalization for treatment of ESRD None  Goal:  Over the next 30 days, the patient will not experience hospital readmission  Interventions:    Chronic Kidney Disease Interventions: Evaluation of current treatment plan related to chronic kidney disease self management and patient's adherence to plan as established by provider      Reviewed medications with patient and discussed importance of compliance    Reviewed scheduled/upcoming provider appointments including    Discussed plans with patient for ongoing care management follow up and provided patient with direct contact information for care management team    Screening for signs and symptoms of depression related to chronic disease state      Assessed social determinant of health barriers    Last practice recorded BP readings:  BP Readings from Last 3 Encounters:  02/10/24 116/63  02/04/24 (!) 164/86  01/22/24 134/77   Most recent eGFR/CrCl: No results found for: "EGFR"  No components found for: "CRCL"  Patient Self Care Activities:  Attend all scheduled provider appointments Call pharmacy for medication refills 3-7 days in advance of running out of medications Call provider office for new concerns or questions  Notify RN Care Manager of TOC call rescheduling needs Participate in Transition of Care Program/Attend TOC scheduled calls Perform all self care activities independently  Take medications as prescribed    Plan: TOC 30 Day Outreach week 2 scheduled for Friday May 9th at 2:00pm        Patient  verbalizes understanding of instructions and care plan provided today and agrees to view in MyChart. Active MyChart status and patient understanding of how to access instructions and care plan via MyChart confirmed with patient.     The patient has been provided with contact information for the care management team and has been advised to call with any health related questions or concerns.   Please call the care guide team at [phone] if you need to cancel or reschedule your appointment.   Please call the Suicide and Crisis Lifeline: 988 call the [place]  National Suicide Prevention Lifeline: 570-451-7114 or TTY: 361-594-9765 TTY 4374678396) to talk to a trained counselor if you are experiencing a Mental Health or Behavioral Health Crisis or need someone to talk to.  Gareld June, BSN, RN [place]  VBCI - [employer] [facility] RN Care Manager [phone]

## 2024-02-12 NOTE — Telephone Encounter (Signed)
 Transition of Care - Initial Contact after Hospitalization  Date of discharge:  02/10/24 Date of contact: 02/12/24  Method: Phone Spoke to: Patient  Patient contacted to discuss transition of care from recent inpatient hospitalization. Patient was admitted to [facility] from 02/04/24 to 02/10/24 with discharge diagnosis of: Sepsis, possible HD line infection. S/p TDC exchange, antibiotics completed per IR. Discussed infection prevention with patient. He feels very strongly against getting and AVF/AVG.   The discharge medication list was reviewed. Calcitriol  and sensipar  to be given at HD.  Patient will return to his/her outpatient HD unit on: 02/13/24  No other concerns at this time.  Ramona Burner, PA-C 02/12/2024, 11:45 AM  [facility] Pager: [phone]

## 2024-02-18 ENCOUNTER — Encounter: Payer: Self-pay | Admitting: Family

## 2024-02-18 ENCOUNTER — Ambulatory Visit (INDEPENDENT_AMBULATORY_CARE_PROVIDER_SITE_OTHER): Admitting: Family

## 2024-02-18 VITALS — BP 138/81 | HR 68 | Temp 99.1°F | Resp 18 | Ht 73.0 in | Wt 206.0 lb

## 2024-02-18 DIAGNOSIS — R509 Fever, unspecified: Secondary | ICD-10-CM | POA: Diagnosis not present

## 2024-02-18 DIAGNOSIS — I10 Essential (primary) hypertension: Secondary | ICD-10-CM

## 2024-02-18 DIAGNOSIS — Z09 Encounter for follow-up examination after completed treatment for conditions other than malignant neoplasm: Secondary | ICD-10-CM | POA: Diagnosis not present

## 2024-02-18 DIAGNOSIS — H6120 Impacted cerumen, unspecified ear: Secondary | ICD-10-CM

## 2024-02-18 DIAGNOSIS — N189 Chronic kidney disease, unspecified: Secondary | ICD-10-CM

## 2024-02-18 DIAGNOSIS — R911 Solitary pulmonary nodule: Secondary | ICD-10-CM

## 2024-02-18 DIAGNOSIS — D631 Anemia in chronic kidney disease: Secondary | ICD-10-CM

## 2024-02-18 DIAGNOSIS — N186 End stage renal disease: Secondary | ICD-10-CM

## 2024-02-18 DIAGNOSIS — Z992 Dependence on renal dialysis: Secondary | ICD-10-CM

## 2024-02-18 DIAGNOSIS — K862 Cyst of pancreas: Secondary | ICD-10-CM

## 2024-02-18 DIAGNOSIS — Z8546 Personal history of malignant neoplasm of prostate: Secondary | ICD-10-CM

## 2024-02-18 DIAGNOSIS — F411 Generalized anxiety disorder: Secondary | ICD-10-CM

## 2024-02-18 DIAGNOSIS — A419 Sepsis, unspecified organism: Secondary | ICD-10-CM

## 2024-02-18 DIAGNOSIS — R918 Other nonspecific abnormal finding of lung field: Secondary | ICD-10-CM

## 2024-02-18 DIAGNOSIS — N12 Tubulo-interstitial nephritis, not specified as acute or chronic: Secondary | ICD-10-CM

## 2024-02-18 DIAGNOSIS — N059 Unspecified nephritic syndrome with unspecified morphologic changes: Secondary | ICD-10-CM

## 2024-02-18 NOTE — Progress Notes (Signed)
 Concerns are mental, dialysis cath was infected

## 2024-02-18 NOTE — Progress Notes (Signed)
 Patient ID: Manuel Ortega, male    DOB: 1950-11-23  MRN: [id number]  CC: Hospital Discharge Follow-Up  Subjective: Manuel Ortega is a 73 y.o. male who presents for hospital discharge follow-up.  His concerns today include:  02/04/2024 - 02/10/2024 [facility] per MD note:  Recommendations for Outpatient Follow-up:  Follow up with PCP in 1-2 weeks Please follow on final results of blood culture and catheter tip cultures. Follow up outpatient for hearing/ear concerns with Audiology/ENT.  Follow-up with pulmonary as an outpatient, ambulatory referral has been sent.     Diet recommendation: renal diet   Brief/Interim Summary:    73 y.o. male with medical history significant for glaucoma, hypertension, prostate cancer, ESRD on Tuesday Thursday Saturday dialysis being admitted to the hospital with concern for sepsis of unclear etiology.  He was recently hospitalized at [facility] earlier this month, for sepsis felt to be due to possible pyelonephritis.  He has been doing well, tolerating his dialysis, however yesterday he started feeling generally unwell, weak, vague nausea.  Today he went to dialysis, was feeling very very cold and chilled, says that he normally does feel cold and dialysis center, but when he comes out of there he really feels better.  Today he did not.  He cut his dialysis session short by 1 hour since he was not feeling well.    He was admitted for fever during HD x 2 in the last few weeks, mild leukocytosis, no clear source of infection.  No clear source could be identified, ID were consulted, concern for HD line infection, so tunneled HD cath was discontinued, patient was kept on line holiday, he was on IV antibiotics during hospital stay, line was reinserted yesterday, where he was dialyzed, blood cultures remain negative, catheter tip culture remains negative as of today, patient is afebrile, stable, he will be discharged without antibiotics per  ID recommendations.    Sepsis, possibly related to HD line infection. -meeting criteria with tachycardia, leukocytosis, possibly due to acute infection given shaking chills.  Lactic acid is elevated to 2.9.  Etiology of his infection is unclear at the time.  He would be at risk for line infection, recurrent UTI, bacteremia.  Right-sided ear fullness with right frontal sinus fullness.  Likely seasonal allergies and blocked eustachian tube, does have some cerumen on exam in the right ear canal.  No tenderness on palpation on the right mastoid, right frontal and maxillary area, no tenderness on manipulation of his right pinna.  Chest and abdominal exam benign, no skin rashes or bruises no joint pains or aches.   Of note patient has had a right subclavian HD catheter for 3 years, he has had 2 episodes of rigors, fevers with HD treatments in the last few weeks.  Suspicious for possible infection of his HD catheter, initially he was resistant to catheter removal, has had the catheter for 3 years.  Now agreeable for catheter removal, nephrology, ID and IR on board.  Catheter was removed on 02/06/2024 by IR, line holiday for 2 days replacement of new catheter on 02/09/2024.  He was dialyzed yesterday, discussed with ID and renal, both okay with him going home today to resume his dialysis on TTS schedule, without antibiotics, blood cultures remains negative as of today, as well as catheter tip culture, please follow on final results.      ESRD-on TTS schedule,   Pancreatic insufficiency-continue Creon    Depression/anxiety-his home medications will be resumed once reconciled  HX of lung nodule.  Will require outpatient pulmonary follow-up within a month of discharge.   Some chronic mild right ear fullness and pain.  Exam stable, has a lot of cerumen, outpatient ENT follow-up.     Discharge Diagnoses:  Principal Problem:   Sepsis (HCC) Active Problems:   Essential hypertension   Anemia in chronic kidney  disease   Fever   Follow-Ups  Schedule an appointment with [name], MD (Nephrology) in 1 week (02/16/2024) Schedule an appointment with Your PCP in 1 week (02/16/2024)  Today's office visit 02/18/2024: Reports he is feeling stable since hospital discharge. Denies red flag symptoms. Doing well on medication regimen, no issues/concerns. States he had to cancel appointment with Pulmonology due to being hospitalized and needs a new referral. States his current Gastroenterology office is with [facility] and he would like to establish with a [facility] office. States he is on normal dialysis schedule. States anxiety worsening due to medical conditions. States he plans to schedule an appointment soon with his therapist. He denies thoughts of self-harm, suicidal ideations, homicidal ideations. Microbiology results unremarkable on review. No further issues/concerns for discussion today.  Patient Active Problem List   Diagnosis Date Noted   Fever 02/08/2024   Pancreatic cyst 01/17/2024   Lung nodules 01/17/2024   Sepsis (HCC) 01/16/2024   ESRD on dialysis (HCC) 01/16/2024   Mucosal abnormality of stomach 10/15/2023   Gastric polyp 10/15/2023   Duodenitis 10/15/2023   Duodenal ulcer 10/15/2023   Acute blood loss anemia 10/13/2023   Melanotic stools 10/13/2023   SOB (shortness of breath) 10/11/2023   Pulmonary nodule 10/09/2023   Pre-syncope 10/09/2023   Dyspnea on exertion 10/09/2023   Anxiety 10/09/2023   Pancreatic cyst 10/09/2023   Prostate cancer (HCC) 11/23/2020   ESRD on dialysis (HCC) 10/31/2020   Bladder outlet obstruction 09/13/2020   Secondary hyperparathyroidism of renal origin (HCC) 07/30/2020   COVID-19 07/16/2020   Iron  deficiency anemia, unspecified 06/26/2020   UTI (urinary tract infection) 06/12/2020   Essential hypertension 06/11/2020   Acute kidney injury (AKI) with acute tubular necrosis (ATN) (HCC) 06/11/2020   Gross hematuria 06/11/2020   Major  depressive disorder 06/11/2020   D-dimer, elevated 06/11/2020   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   Prolonged QT interval 06/11/2020   Urinary tract infection associated with catheterization of urinary tract, initial encounter (HCC) 06/10/2020   Moderate protein-calorie malnutrition (HCC) 06/05/2020   Allergy, unspecified, sequela 06/02/2020   Anemia in chronic kidney disease 06/02/2020   Coagulation defect, unspecified (HCC) 06/02/2020   Encounter for immunization 06/02/2020   Pain, unspecified 06/02/2020   Personal history of anaphylaxis 06/02/2020   Acute kidney injury (HCC) 05/11/2020   Hypokalemia 05/11/2020   Hypocalcemia 05/11/2020   Hypomagnesemia 05/11/2020   Nausea and vomiting 05/11/2020   Primary open angle glaucoma (POAG) of both eyes, severe stage 08/13/2017     Current Outpatient Medications on File Prior to Visit  Medication Sig Dispense Refill   calcitRIOL  (ROCALTROL ) 0.25 MCG capsule Take 5 capsules (1.25 mcg total) by mouth Every Tuesday,Thursday,and Saturday with dialysis. 30 capsule 0   cinacalcet  (SENSIPAR ) 30 MG tablet Take 1 tablet (30 mg total) by mouth Every Tuesday,Thursday,and Saturday with dialysis. 60 tablet 0   CREON  36000-114000 units CPEP capsule Take 36,000-72,000 Units by mouth See admin instructions. Take 72,000 units by mouth three time a day with meals and 36,000 units three times a day with snacks     ferrous sulfate  325 (65  FE) MG EC tablet Take 325 mg by mouth daily with breakfast.     LUMIGAN  0.01 % SOLN Place 1 drop into both eyes at bedtime.     montelukast  (SINGULAIR ) 10 MG tablet Take 10 mg by mouth daily.     pantoprazole  (PROTONIX ) 40 MG tablet Take 1 tablet (40 mg total) by mouth 2 (two) times daily. 60 tablet 1   sertraline  (ZOLOFT ) 100 MG tablet Take 100 mg by mouth in the morning and at bedtime.     sildenafil (VIAGRA) 100 MG tablet Take 100 mg by mouth as needed for erectile dysfunction.     timolol   (TIMOPTIC ) 0.5 % ophthalmic solution Place 1 drop into both eyes every morning.     TYLENOL  500 MG tablet Take 500-1,000 mg by mouth every 6 (six) hours as needed for mild pain (pain score 1-3) or headache.     diphenhydrAMINE  (BENADRYL ) 25 mg capsule Take 25 mg by mouth every 4 (four) hours as needed for itching or allergies.     mirtazapine  (REMERON ) 15 MG tablet Take 15 mg by mouth daily.     QUEtiapine  Fumarate (SEROQUEL  XR) 150 MG 24 hr tablet Take 300 mg by mouth at bedtime.     No current facility-administered medications on file prior to visit.    No Known Allergies  Social History   Socioeconomic History   Marital status: Divorced    Spouse name: Not on file   Number of children: Not on file   Years of education: Not on file   Highest education level: Not on file  Occupational History   Occupation: Art gallery manager  Tobacco Use   Smoking status: Never    Passive exposure: Never   Smokeless tobacco: Never  Vaping Use   Vaping status: Never Used  Substance and Sexual Activity   Alcohol use: Not Currently    Alcohol/week: 1.0 standard drink of alcohol    Types: 1 Glasses of wine per week    Comment: rare   Drug use: Not Currently   Sexual activity: Not Currently    Birth control/protection: None  Other Topics Concern   Not on file  Social History Narrative   ** Merged History Encounter **       Right handed Drinks caffeine Lives here in [place] for 6 years, came from [place] Lives alone   Social Drivers of Health   Financial Resource Strain: Low Risk  (11/10/2023)   Received from [facility]   Overall Financial Resource Strain (CARDIA)    Difficulty of Paying Living Expenses: Not hard at all  Food Insecurity: No Food Insecurity (02/12/2024)   Hunger Vital Sign    Worried About Running Out of Food in the Last Year: Never true    Ran Out of Food in the Last Year: Never true  Transportation Needs: No Transportation Needs (02/12/2024)   PRAPARE - Therapist, art (Medical): No    Lack of Transportation (Non-Medical): No  Physical Activity: Sufficiently Active (08/21/2022)   [facility] from [facility]   Exercise Vital Sign    Days of Exercise per Week: 4 days    Minutes of Exercise per Session: 50 min  Stress: No Stress Concern Present (02/02/2024)   Received from [facility] of [facility] - Occupational Stress Questionnaire    Feeling of Stress : Only a little  Social Connections: Moderately Isolated (02/05/2024)   Social Connection and Isolation Panel [NHANES]  Frequency of Communication with Friends and Family: More than three times a week    Frequency of Social Gatherings with Friends and Family: More than three times a week    Attends Religious Services: Never    Database administrator or Organizations: No    Attends Engineer, structural: More than 4 times per year    Marital Status: Divorced  Intimate Partner Violence: Not At Risk (02/12/2024)   Humiliation, Afraid, Rape, and Kick questionnaire    Fear of Current or Ex-Partner: No    Emotionally Abused: No    Physically Abused: No    Sexually Abused: No    Family History  Problem Relation Age of Onset   Stroke Mother    Breast cancer Neg Hx    Colon cancer Neg Hx    Prostate cancer Neg Hx    Pancreatic cancer Neg Hx     Past Surgical History:  Procedure Laterality Date   APPENDECTOMY     BIOPSY  10/15/2023   Procedure: BIOPSY;  Surgeon: Ace Holder, MD;  Location: [facility] ENDOSCOPY;  Service: Gastroenterology;;   DIALYSIS/PERMA CATHETER INSERTION N/A 09/23/2023   Procedure: DIALYSIS/PERMA CATHETER INSERTION;  Surgeon: Patrick Boor, MD;  Location: [facility] INVASIVE CV LAB;  Service: Cardiovascular;  Laterality: N/A;   DIALYSIS/PERMA CATHETER REMOVAL N/A 09/23/2023   Procedure: DIALYSIS/PERMA CATHETER REMOVAL;  Surgeon: Patrick Boor, MD;  Location: [facility] INVASIVE CV LAB;  Service: Cardiovascular;  Laterality:  N/A;   ESOPHAGOGASTRODUODENOSCOPY (EGD) WITH PROPOFOL  N/A 10/15/2023   Procedure: ESOPHAGOGASTRODUODENOSCOPY (EGD) WITH PROPOFOL ;  Surgeon: Ace Holder, MD;  Location: [facility] ENDOSCOPY;  Service: Gastroenterology;  Laterality: N/A;   growth in nostril     biopsied negatiove   IR FLUORO GUIDE CV LINE RIGHT  05/12/2020   IR FLUORO GUIDE CV LINE RIGHT  05/28/2020   IR FLUORO GUIDE CV LINE RIGHT  02/09/2024   IR REMOVAL TUN CV CATH W/O FL  02/07/2024   IR US  GUIDE VASC ACCESS RIGHT  05/12/2020   IR US  GUIDE VASC ACCESS RIGHT  05/28/2020   IR US  GUIDE VASC ACCESS RIGHT  02/09/2024   TRANSURETHRAL RESECTION OF PROSTATE N/A 09/13/2020   Procedure: TRANSURETHRAL RESECTION OF THE PROSTATE (TURP) WITH CYSTOSCOPY;  Surgeon: Florencio Hunting, MD;  Location: [facility] ORS;  Service: Urology;  Laterality: N/A;    ROS: Review of Systems Negative except as stated above  PHYSICAL EXAM: BP 138/81   Pulse 68   Temp 99.1 F (37.3 C) (Oral)   Resp 18   Ht 6\' 1"  (1.854 m)   Wt 206 lb (93.4 kg)   SpO2 97%   BMI 27.18 kg/m   Physical Exam HENT:     Head: Normocephalic and atraumatic.     Nose: Nose normal.     Mouth/Throat:     Mouth: Mucous membranes are moist.     Pharynx: Oropharynx is clear.  Eyes:     Extraocular Movements: Extraocular movements intact.     Conjunctiva/sclera: Conjunctivae normal.     Pupils: Pupils are equal, round, and reactive to light.  Cardiovascular:     Rate and Rhythm: Normal rate and regular rhythm.     Pulses: Normal pulses.     Heart sounds: Normal heart sounds.  Pulmonary:     Effort: Pulmonary effort is normal.     Breath sounds: Normal breath sounds.  Musculoskeletal:        General: Normal range of motion.     Cervical back:  Normal range of motion and neck supple.  Neurological:     General: No focal deficit present.     Mental Status: He is alert and oriented to person, place, and time.  Psychiatric:        Mood and Affect: Mood normal.         Behavior: Behavior normal.     ASSESSMENT AND PLAN: 1. Hospital discharge follow-up (Primary) 2. Sepsis, due to unspecified organism, unspecified whether acute organ dysfunction present (HCC) 3. Fever, unspecified fever cause - Reviewed hospital course, current medications, ensured proper follow-up in place, and addressed concerns.   4. Primary hypertension - Continue present management.  - Counseled on blood pressure goal of less than 130/80, low-sodium, DASH diet, medication compliance, and 150 minutes of moderate intensity exercise per week as tolerated. Counseled on medication adherence and adverse effects. - Follow-up with primary provider in 3 months or sooner if needed.  5. ESRD on dialysis (HCC) 6. Pyelonephritis 7. Anemia in chronic kidney disease, unspecified CKD stage - Referral to Nephrology for evaluation/management.  - Keep all scheduled dialysis appointments.  - Ambulatory referral to Nephrology  8. Pancreatic cyst - Referral to Gastroenterology for evaluation/management. - Ambulatory referral to Gastroenterology  9. Lung nodules - Referral to Pulmonology for evaluation/management. - Ambulatory referral to Pulmonology  10. History of prostate cancer - Referral to Hematology / Oncology for evaluation/management. - Ambulatory referral to Hematology / Oncology  11. Generalized anxiety disorder - Patient denies thoughts of self-harm, suicidal ideations, homicidal ideations. - Continue present management.  - Keep all scheduled appointments with therapist.  - Follow-up with primary provider as scheduled.  12. Excessive cerumen in ear canal, unspecified laterality - Referral to ENT for evaluation/management. - Ambulatory referral to ENT   Patient was given the opportunity to ask questions.  Patient verbalized understanding of the plan and was able to repeat key elements of the plan. Patient was given clear instructions to go to Emergency Department or return to  medical center if symptoms don't improve, worsen, or new problems develop.The patient verbalized understanding.   Orders Placed This Encounter  Procedures   Ambulatory referral to Pulmonology   Ambulatory referral to Gastroenterology   Ambulatory referral to Nephrology   Ambulatory referral to Hematology / Oncology   Ambulatory referral to ENT   Follow-up with primary provider as scheduled.   Senaida Dama, NP

## 2024-02-19 ENCOUNTER — Telehealth: Payer: Self-pay

## 2024-02-22 ENCOUNTER — Other Ambulatory Visit: Payer: Self-pay | Admitting: Family

## 2024-02-22 NOTE — Telephone Encounter (Unsigned)
 Copied from CRM [id number]. Topic: Clinical - Medication Refill >> Feb 22, 2024  2:47 PM Antwanette L wrote: Medication: QUEtiapine  Fumarate (SEROQUEL  XR) 150 MG 24 hr tablet, timolol  (TIMOPTIC ) 0.5 % ophthalmic solution, and LUMIGAN  0.01 % SOLN  Has the patient contacted their pharmacy? Yes   This is the patient's preferred pharmacy:  [facility] [address] [place] [address] [place]  [address] Phone: [phone] Fax: [phone]    Is this the correct pharmacy for this prescription? Yes If no, delete pharmacy and type the correct one.   Has the prescription been filled recently? No. Lumigan  was refilled on 12/21/23, timolol  refilled on 12/18/23 and quetiapine  refilled on 11/27/23  Is the patient out of the medication? Yes  Has the patient been seen for an appointment in the last year OR does the patient have an upcoming appointment? Yes  Can we respond through MyChart? No. Contact patient by phone at [phone]  Agent: Please be advised that Rx refills may take up to 3 business days. We ask that you follow-up with your pharmacy.

## 2024-02-24 NOTE — Telephone Encounter (Signed)
 Requested medication (s) are due for refill today: yes  Requested medication (s) are on the active medication list: yes  Last refill:  01/16/24  Future visit scheduled: yes  Notes to clinic:  Unable to refill per protocol, last refill by another provider.      Requested Prescriptions  Pending Prescriptions Disp Refills   QUEtiapine  Fumarate (SEROQUEL  XR) 150 MG 24 hr tablet 30 tablet     Sig: Take 2 tablets (300 mg total) by mouth at bedtime.     Not Delegated - Psychiatry:  Antipsychotics - Second Generation (Atypical) - quetiapine  Failed - 02/24/2024 12:00 PM      Failed - This refill cannot be delegated      Failed - TSH in normal range and within 360 days    No results found for: "TSH", "POCTSH", "TSHREFLEX"       Failed - Valid encounter within last 6 months    Recent Outpatient Visits           6 days ago Hospital discharge follow-up   [place] [facility], Amy J, NP   1 month ago Encounter to establish care   [facility] at [facility], Amy J, NP              Failed - Lipid Panel in normal range within the last 12 months    Cholesterol  Date Value Ref Range Status  05/14/2023 140 0 - 200 mg/dL Final    Comment:    ATP III Classification       Desirable:  < 200 mg/dL               Borderline High:  200 - 239 mg/dL          High:  > = 914 mg/dL   LDL Cholesterol  Date Value Ref Range Status  05/14/2023 72 0 - 99 mg/dL Final   HDL  Date Value Ref Range Status  05/14/2023 45.00 >39.00 mg/dL Final   Triglycerides  Date Value Ref Range Status  05/14/2023 115.0 0.0 - 149.0 mg/dL Final    Comment:    Normal:  <150 mg/dLBorderline High:  150 - 199 mg/dL         Passed - Completed PHQ-2 or PHQ-9 in the last 360 days      Passed - Last BP in normal range    BP Readings from Last 1 Encounters:  02/18/24 138/81         Passed - Last Heart Rate in normal range    Pulse Readings from Last 1 Encounters:   02/18/24 68         Passed - CBC within normal limits and completed in the last 12 months    WBC  Date Value Ref Range Status  02/10/2024 10.0 4.0 - 10.5 K/uL Final   RBC  Date Value Ref Range Status  02/10/2024 3.57 (L) 4.22 - 5.81 MIL/uL Final   Hemoglobin  Date Value Ref Range Status  02/10/2024 10.6 (L) 13.0 - 17.0 g/dL Final   HCT  Date Value Ref Range Status  02/10/2024 32.0 (L) 39.0 - 52.0 % Final   MCHC  Date Value Ref Range Status  02/10/2024 33.1 30.0 - 36.0 g/dL Final   [facility]  Date Value Ref Range Status  02/10/2024 29.7 26.0 - 34.0 pg Final   MCV  Date Value Ref Range Status  02/10/2024 89.6 80.0 - 100.0 fL Final   No  results found for: "PLTCOUNTKUC", "LABPLAT", "POCPLA" RDW  Date Value Ref Range Status  02/10/2024 14.4 11.5 - 15.5 % Final         Passed - CMP within normal limits and completed in the last 12 months    Albumin   Date Value Ref Range Status  02/10/2024 3.2 (L) 3.5 - 5.0 g/dL Final   Alkaline Phosphatase  Date Value Ref Range Status  02/04/2024 66 38 - 126 U/L Final   ALT  Date Value Ref Range Status  02/04/2024 10 0 - 44 U/L Final   AST  Date Value Ref Range Status  02/04/2024 18 15 - 41 U/L Final   BUN  Date Value Ref Range Status  02/10/2024 35 (H) 8 - 23 mg/dL Final   Calcium   Date Value Ref Range Status  02/10/2024 9.0 8.9 - 10.3 mg/dL Final   Calcium , Ion  Date Value Ref Range Status  10/15/2023 1.14 (L) 1.15 - 1.40 mmol/L Final   CO2  Date Value Ref Range Status  02/10/2024 26 22 - 32 mmol/L Final   Bicarbonate  Date Value Ref Range Status  05/11/2020 25.4 20.0 - 28.0 mmol/L Final   TCO2  Date Value Ref Range Status  10/15/2023 24 22 - 32 mmol/L Final   Creatinine, Ser  Date Value Ref Range Status  02/10/2024 5.18 (H) 0.61 - 1.24 mg/dL Final   Glucose, Bld  Date Value Ref Range Status  02/10/2024 60 (L) 70 - 99 mg/dL Final    Comment:    Glucose reference range applies only to samples taken after  fasting for at least 8 hours.   Glucose-Capillary  Date Value Ref Range Status  10/09/2023 138 (H) 70 - 99 mg/dL Final    Comment:    Glucose reference range applies only to samples taken after fasting for at least 8 hours.   Potassium  Date Value Ref Range Status  02/10/2024 3.7 3.5 - 5.1 mmol/L Final   Sodium  Date Value Ref Range Status  02/10/2024 135 135 - 145 mmol/L Final   Total Bilirubin  Date Value Ref Range Status  02/04/2024 0.6 0.0 - 1.2 mg/dL Final   Protein, ur  Date Value Ref Range Status  02/04/2024 100 (A) NEGATIVE mg/dL Final   Total Protein  Date Value Ref Range Status  02/04/2024 7.5 6.5 - 8.1 g/dL Final   GFR calc Af Amer  Date Value Ref Range Status  06/12/2020 7 (L) >60 mL/min Final   GFR, Estimated  Date Value Ref Range Status  02/10/2024 11 (L) >60 mL/min Final    Comment:    (NOTE) Calculated using the CKD-EPI Creatinine Equation (2021)           timolol  (TIMOPTIC ) 0.5 % ophthalmic solution 10 mL     Sig: Place 1 drop into both eyes every morning.     Ophthalmology:  Glaucoma 2 Failed - 02/24/2024 12:00 PM      Failed - Valid encounter within last 12 months    Recent Outpatient Visits           6 days ago Hospital discharge follow-up   [facility], [place], NP   1 month ago Encounter to establish care   [facility], Amy J, NP              Passed - Last BP in normal range    BP Readings from Last 1 Encounters:  02/18/24 138/81         Passed - Last Heart Rate in normal range    Pulse Readings from Last 1 Encounters:  02/18/24 68          LUMIGAN  0.01 % SOLN      Sig: Place 1 drop into both eyes at bedtime.     Ophthalmology:  Glaucoma Failed - 02/24/2024 12:00 PM      Failed - Valid encounter within last 12 months    Recent Outpatient Visits           6 days ago Hospital discharge follow-up   [facility] at [facility], [place], NP   1 month ago Encounter to establish care   [facility] at [facility], Annalee Barren, NP

## 2024-02-24 NOTE — Telephone Encounter (Signed)
 Requested medication (s) are due for refill today - yes  Requested medication (s) are on the active medication list -yes  Future visit scheduled -no  Last refill: Quetiapine - 11/27/23- non delegated Rx                  Timolol - 12/18/23- historical medication                  Lumigan - 12/21/23- historical medication    Notes to clinic: see above  Requested Prescriptions  Pending Prescriptions Disp Refills   QUEtiapine  Fumarate (SEROQUEL  XR) 150 MG 24 hr tablet 30 tablet     Sig: Take 2 tablets (300 mg total) by mouth at bedtime.     Not Delegated - Psychiatry:  Antipsychotics - Second Generation (Atypical) - quetiapine  Failed - 02/24/2024 12:04 PM      Failed - This refill cannot be delegated      Failed - TSH in normal range and within 360 days    No results found for: "TSH", "POCTSH", "TSHREFLEX"       Failed - Valid encounter within last 6 months    Recent Outpatient Visits           6 days ago Hospital discharge follow-up    [facility] at [facility], Amy J, NP   1 month ago Encounter to establish care   [facility] at [facility], Amy J, NP              Failed - Lipid Panel in normal range within the last 12 months    Cholesterol  Date Value Ref Range Status  05/14/2023 140 0 - 200 mg/dL Final    Comment:    ATP III Classification       Desirable:  < 200 mg/dL               Borderline High:  200 - 239 mg/dL          High:  > = 213 mg/dL   LDL Cholesterol  Date Value Ref Range Status  05/14/2023 72 0 - 99 mg/dL Final   HDL  Date Value Ref Range Status  05/14/2023 45.00 >39.00 mg/dL Final   Triglycerides  Date Value Ref Range Status  05/14/2023 115.0 0.0 - 149.0 mg/dL Final    Comment:    Normal:  <150 mg/dLBorderline High:  150 - 199 mg/dL         Passed - Completed PHQ-2 or PHQ-9 in the last 360 days      Passed - Last BP in normal range    BP Readings from Last 1 Encounters:  02/18/24 138/81          Passed - Last Heart Rate in normal range    Pulse Readings from Last 1 Encounters:  02/18/24 68         Passed - CBC within normal limits and completed in the last 12 months    WBC  Date Value Ref Range Status  02/10/2024 10.0 4.0 - 10.5 K/uL Final   RBC  Date Value Ref Range Status  02/10/2024 3.57 (L) 4.22 - 5.81 MIL/uL Final   Hemoglobin  Date Value Ref Range Status  02/10/2024 10.6 (L) 13.0 - 17.0 g/dL Final   HCT  Date Value Ref Range Status  02/10/2024 32.0 (L) 39.0 - 52.0 % Final   MCHC  Date Value Ref Range Status  02/10/2024 33.1 30.0 - 36.0 g/dL Final  [facility]  Date Value Ref Range Status  02/10/2024 29.7 26.0 - 34.0 pg Final   MCV  Date Value Ref Range Status  02/10/2024 89.6 80.0 - 100.0 fL Final   No results found for: "PLTCOUNTKUC", "LABPLAT", "POCPLA" RDW  Date Value Ref Range Status  02/10/2024 14.4 11.5 - 15.5 % Final         Passed - CMP within normal limits and completed in the last 12 months    Albumin   Date Value Ref Range Status  02/10/2024 3.2 (L) 3.5 - 5.0 g/dL Final   Alkaline Phosphatase  Date Value Ref Range Status  02/04/2024 66 38 - 126 U/L Final   ALT  Date Value Ref Range Status  02/04/2024 10 0 - 44 U/L Final   AST  Date Value Ref Range Status  02/04/2024 18 15 - 41 U/L Final   BUN  Date Value Ref Range Status  02/10/2024 35 (H) 8 - 23 mg/dL Final   Calcium   Date Value Ref Range Status  02/10/2024 9.0 8.9 - 10.3 mg/dL Final   Calcium , Ion  Date Value Ref Range Status  10/15/2023 1.14 (L) 1.15 - 1.40 mmol/L Final   CO2  Date Value Ref Range Status  02/10/2024 26 22 - 32 mmol/L Final   Bicarbonate  Date Value Ref Range Status  05/11/2020 25.4 20.0 - 28.0 mmol/L Final   TCO2  Date Value Ref Range Status  10/15/2023 24 22 - 32 mmol/L Final   Creatinine, Ser  Date Value Ref Range Status  02/10/2024 5.18 (H) 0.61 - 1.24 mg/dL Final   Glucose, Bld  Date Value Ref Range Status  02/10/2024 60 (L) 70 - 99  mg/dL Final    Comment:    Glucose reference range applies only to samples taken after fasting for at least 8 hours.   Glucose-Capillary  Date Value Ref Range Status  10/09/2023 138 (H) 70 - 99 mg/dL Final    Comment:    Glucose reference range applies only to samples taken after fasting for at least 8 hours.   Potassium  Date Value Ref Range Status  02/10/2024 3.7 3.5 - 5.1 mmol/L Final   Sodium  Date Value Ref Range Status  02/10/2024 135 135 - 145 mmol/L Final   Total Bilirubin  Date Value Ref Range Status  02/04/2024 0.6 0.0 - 1.2 mg/dL Final   Protein, ur  Date Value Ref Range Status  02/04/2024 100 (A) NEGATIVE mg/dL Final   Total Protein  Date Value Ref Range Status  02/04/2024 7.5 6.5 - 8.1 g/dL Final   GFR calc Af Amer  Date Value Ref Range Status  06/12/2020 7 (L) >60 mL/min Final   GFR, Estimated  Date Value Ref Range Status  02/10/2024 11 (L) >60 mL/min Final    Comment:    (NOTE) Calculated using the CKD-EPI Creatinine Equation (2021)           timolol  (TIMOPTIC ) 0.5 % ophthalmic solution 10 mL     Sig: Place 1 drop into both eyes every morning.     Ophthalmology:  Glaucoma 2 Failed - 02/24/2024 12:04 PM      Failed - Valid encounter within last 12 months    Recent Outpatient Visits           6 days ago Hospital discharge follow-up   [facility], [place], NP   1 month ago Encounter to establish care   [facility] at  [address] [place], Amy J, NP              Passed - Last BP in normal range    BP Readings from Last 1 Encounters:  02/18/24 138/81         Passed - Last Heart Rate in normal range    Pulse Readings from Last 1 Encounters:  02/18/24 68          LUMIGAN  0.01 % SOLN      Sig: Place 1 drop into both eyes at bedtime.     Ophthalmology:  Glaucoma Failed - 02/24/2024 12:04 PM      Failed - Valid encounter within last 12 months    Recent Outpatient Visits            6 days ago Hospital discharge follow-up   [facility] at [facility], Amy J, NP   1 month ago Encounter to establish care   [facility] Care at [facility], [place], NP                 Requested Prescriptions  Pending Prescriptions Disp Refills   QUEtiapine  Fumarate (SEROQUEL  XR) 150 MG 24 hr tablet 30 tablet     Sig: Take 2 tablets (300 mg total) by mouth at bedtime.     Not Delegated - Psychiatry:  Antipsychotics - Second Generation (Atypical) - quetiapine  Failed - 02/24/2024 12:04 PM      Failed - This refill cannot be delegated      Failed - TSH in normal range and within 360 days    No results found for: "TSH", "POCTSH", "TSHREFLEX"       Failed - Valid encounter within last 6 months    Recent Outpatient Visits           6 days ago Hospital discharge follow-up   [facility] at [facility], Amy J, NP   1 month ago Encounter to establish care   [facility] at [facility], Amy J, NP              Failed - Lipid Panel in normal range within the last 12 months    Cholesterol  Date Value Ref Range Status  05/14/2023 140 0 - 200 mg/dL Final    Comment:    ATP III Classification       Desirable:  < 200 mg/dL               Borderline High:  200 - 239 mg/dL          High:  > = 161 mg/dL   LDL Cholesterol  Date Value Ref Range Status  05/14/2023 72 0 - 99 mg/dL Final   HDL  Date Value Ref Range Status  05/14/2023 45.00 >39.00 mg/dL Final   Triglycerides  Date Value Ref Range Status  05/14/2023 115.0 0.0 - 149.0 mg/dL Final    Comment:    Normal:  <150 mg/dLBorderline High:  150 - 199 mg/dL         Passed - Completed PHQ-2 or PHQ-9 in the last 360 days      Passed - Last BP in normal range    BP Readings from Last 1 Encounters:  02/18/24 138/81         Passed - Last Heart Rate in normal range    Pulse Readings from Last 1 Encounters:  02/18/24 68  Passed - CBC within normal limits and completed in the last 12 months    WBC  Date Value Ref Range Status  02/10/2024 10.0 4.0 - 10.5 K/uL Final   RBC  Date Value Ref Range Status  02/10/2024 3.57 (L) 4.22 - 5.81 MIL/uL Final   Hemoglobin  Date Value Ref Range Status  02/10/2024 10.6 (L) 13.0 - 17.0 g/dL Final   HCT  Date Value Ref Range Status  02/10/2024 32.0 (L) 39.0 - 52.0 % Final   MCHC  Date Value Ref Range Status  02/10/2024 33.1 30.0 - 36.0 g/dL Final   [facility]  Date Value Ref Range Status  02/10/2024 29.7 26.0 - 34.0 pg Final   MCV  Date Value Ref Range Status  02/10/2024 89.6 80.0 - 100.0 fL Final   No results found for: "PLTCOUNTKUC", "LABPLAT", "POCPLA" RDW  Date Value Ref Range Status  02/10/2024 14.4 11.5 - 15.5 % Final         Passed - CMP within normal limits and completed in the last 12 months    Albumin   Date Value Ref Range Status  02/10/2024 3.2 (L) 3.5 - 5.0 g/dL Final   Alkaline Phosphatase  Date Value Ref Range Status  02/04/2024 66 38 - 126 U/L Final   ALT  Date Value Ref Range Status  02/04/2024 10 0 - 44 U/L Final   AST  Date Value Ref Range Status  02/04/2024 18 15 - 41 U/L Final   BUN  Date Value Ref Range Status  02/10/2024 35 (H) 8 - 23 mg/dL Final   Calcium   Date Value Ref Range Status  02/10/2024 9.0 8.9 - 10.3 mg/dL Final   Calcium , Ion  Date Value Ref Range Status  10/15/2023 1.14 (L) 1.15 - 1.40 mmol/L Final   CO2  Date Value Ref Range Status  02/10/2024 26 22 - 32 mmol/L Final   Bicarbonate  Date Value Ref Range Status  05/11/2020 25.4 20.0 - 28.0 mmol/L Final   TCO2  Date Value Ref Range Status  10/15/2023 24 22 - 32 mmol/L Final   Creatinine, Ser  Date Value Ref Range Status  02/10/2024 5.18 (H) 0.61 - 1.24 mg/dL Final   Glucose, Bld  Date Value Ref Range Status  02/10/2024 60 (L) 70 - 99 mg/dL Final    Comment:    Glucose reference range applies only to samples taken after fasting for at least 8  hours.   Glucose-Capillary  Date Value Ref Range Status  10/09/2023 138 (H) 70 - 99 mg/dL Final    Comment:    Glucose reference range applies only to samples taken after fasting for at least 8 hours.   Potassium  Date Value Ref Range Status  02/10/2024 3.7 3.5 - 5.1 mmol/L Final   Sodium  Date Value Ref Range Status  02/10/2024 135 135 - 145 mmol/L Final   Total Bilirubin  Date Value Ref Range Status  02/04/2024 0.6 0.0 - 1.2 mg/dL Final   Protein, ur  Date Value Ref Range Status  02/04/2024 100 (A) NEGATIVE mg/dL Final   Total Protein  Date Value Ref Range Status  02/04/2024 7.5 6.5 - 8.1 g/dL Final   GFR calc Af Amer  Date Value Ref Range Status  06/12/2020 7 (L) >60 mL/min Final   GFR, Estimated  Date Value Ref Range Status  02/10/2024 11 (L) >60 mL/min Final    Comment:    (NOTE) Calculated using the CKD-EPI Creatinine Equation (2021)  timolol  (TIMOPTIC ) 0.5 % ophthalmic solution 10 mL     Sig: Place 1 drop into both eyes every morning.     Ophthalmology:  Glaucoma 2 Failed - 02/24/2024 12:04 PM      Failed - Valid encounter within last 12 months    Recent Outpatient Visits           6 days ago Hospital discharge follow-up   [facility] at [facility], [place], NP   1 month ago Encounter to establish care   [facility] Care at [facility], Amy J, NP              Passed - Last BP in normal range    BP Readings from Last 1 Encounters:  02/18/24 138/81         Passed - Last Heart Rate in normal range    Pulse Readings from Last 1 Encounters:  02/18/24 68          LUMIGAN  0.01 % SOLN      Sig: Place 1 drop into both eyes at bedtime.     Ophthalmology:  Glaucoma Failed - 02/24/2024 12:04 PM      Failed - Valid encounter within last 12 months    Recent Outpatient Visits           6 days ago Hospital discharge follow-up   [facility] at [facility], [place], NP    1 month ago Encounter to establish care   [facility] Primary Care at [facility], Annalee Barren, NP

## 2024-02-24 NOTE — Telephone Encounter (Signed)
 Quetiapine  appears as historical medication. Schedule appointment. During the interim report to the Emergency Department/Urgent Care/call 911 for immediate medical evaluation.

## 2024-02-24 NOTE — Telephone Encounter (Signed)
Msg has been sent to provider

## 2024-02-25 ENCOUNTER — Telehealth: Payer: Self-pay

## 2024-02-25 NOTE — Telephone Encounter (Signed)
 Noted.

## 2024-02-25 NOTE — Telephone Encounter (Signed)
 Copied from CRM [id number]. Topic: Referral - Status >> Feb 24, 2024  3:34 PM Rennis Case wrote: Reason for EAV:[id number] w/ [facility] states they received a referral for patient and the dx states history of prostate cancer.   Corbin Dess reached out to patient and was informed by the patient that he is is wanting to have a procedure to have an endoscopy done to check nodules on lungs. Patient declined seeing an oncologist. Patient told Corbin Dess, he has this done every 6 months. Advised patient referral would be closed unless patinet changed mind.

## 2024-02-25 NOTE — Telephone Encounter (Signed)
 Schedule appointment?

## 2024-02-25 NOTE — Telephone Encounter (Signed)
 Copied from CRM [id number]. Topic: Clinical - Medication Refill >> Feb 22, 2024  2:47 PM Antwanette L wrote: Medication: QUEtiapine  Fumarate (SEROQUEL  XR) 150 MG 24 hr tablet, timolol  (TIMOPTIC ) 0.5 % ophthalmic solution, and LUMIGAN  0.01 % SOLN  Has the patient contacted their pharmacy? Yes   This is the patient's preferred pharmacy:  [facility] [address] [place] [address] [place]  [address] Phone: [phone] Fax: [phone]    Is this the correct pharmacy for this prescription? Yes If no, delete pharmacy and type the correct one.   Has the prescription been filled recently? No. Lumigan  was refilled on 12/21/23, timolol  refilled on 12/18/23 and quetiapine  refilled on 11/27/23  Is the patient out of the medication? Yes  Has the patient been seen for an appointment in the last year OR does the patient have an upcoming appointment? Yes  Can we respond through MyChart? No. Contact patient by phone at [phone]  Agent: Please be advised that Rx refills may take up to 3 business days. We ask that you follow-up with your pharmacy.

## 2024-02-26 ENCOUNTER — Telehealth (HOSPITAL_BASED_OUTPATIENT_CLINIC_OR_DEPARTMENT_OTHER): Payer: Self-pay | Admitting: Acute Care

## 2024-02-26 ENCOUNTER — Telehealth: Payer: Self-pay | Admitting: Family

## 2024-02-26 NOTE — Telephone Encounter (Signed)
 Pt is requesting refill for glaucoma eye drop medications: lumigan  and timolol . Pt stated he should not need to come for appt for med refill because he had a HFU on 05/08.  Pt was irate during the phone call because he stated we should have known that his urologist in on top of his prostate dx history and that the provider did not need to make a referral to oncology. He stated he wanted to have a "logical conversation" with someone who can explain to him the situation of why the provider did not see on his chart that he already has a doctor for that. I advised the patient that the referral to oncology can simply be closed/denied. Pt stated he still wanted to speak to someone because he wants to understand.

## 2024-02-26 NOTE — Telephone Encounter (Signed)
 Copied from CRM [id number]. Topic: Appointments - Scheduling Inquiry for Clinic >> Feb 26, 2024 10:15 AM Manuel Ortega wrote: Reason for CRM:   Pt is contacting clinic regarding referral that was sent over by PCP. He reports he was referred due to finding of lung nodule during procedure through gastroenterology, to have procedure performed.    Advised he would need to schedule a consultation with pulmonologist, to review his medical history and evaluate need for procedure. Pt refuses to make appt, he wants the procedure scheduled and does not feel it is necessary to come in for an appt first. He reports the provider should be able to access all his records and review them, without him coming in.   Requests call back  CB#  [phone] >> Feb 26, 2024 10:56 AM Manuel Ortega wrote: How should we proceed?    This is the second call of this nature from the patient. He will not make a consultation. He has been told multiple times that consultations are normally required. Can someone from a clinical perspective speak with him?

## 2024-02-26 NOTE — Telephone Encounter (Signed)
 Lumigan  ophthalmic solution and Timolol  ophthalmic solution appear as historical medications. Ophthalmology to refill. Patient's preference to schedule appointment with recommended specialists.

## 2024-02-29 ENCOUNTER — Telehealth: Payer: Self-pay

## 2024-02-29 ENCOUNTER — Ambulatory Visit: Payer: Self-pay | Admitting: Family

## 2024-02-29 NOTE — Telephone Encounter (Signed)
 I have attempted to contact this patient by phone with the following results: left message to return my call on answering machine.

## 2024-02-29 NOTE — Telephone Encounter (Signed)
 Copied from CRM [id number]. Topic: Clinical - Medication Refill >> Feb 22, 2024  2:47 PM Manuel Ortega wrote: Medication: QUEtiapine  Fumarate (SEROQUEL  XR) 150 MG 24 hr tablet, timolol  (TIMOPTIC ) 0.5 % ophthalmic solution, and LUMIGAN  0.01 % SOLN  Has the patient contacted their pharmacy? Yes   This is the patient's preferred pharmacy:  [facility] [address] [place] - [address] [place]  [address] Phone: [phone] Fax: [phone]    Is this the correct pharmacy for this prescription? Yes If no, delete pharmacy and type the correct one.   Has the prescription been filled recently? No. Lumigan  was refilled on 12/21/23, timolol  refilled on 12/18/23 and quetiapine  refilled on 11/27/23  Is the patient out of the medication? Yes  Has the patient been seen for an appointment in the last year OR does the patient have an upcoming appointment? Yes  Can we respond through MyChart? No. Contact patient by phone at [phone]  Agent: Please be advised that Rx refills may take up to 3 business days. We ask that you follow-up with your pharmacy.

## 2024-02-29 NOTE — Telephone Encounter (Signed)
 Copied from CRM [id number]. Topic: Clinical - Medication Refill >> Feb 22, 2024  2:47 PM Antwanette L wrote: Medication: QUEtiapine  Fumarate (SEROQUEL  XR) 150 MG 24 hr tablet, timolol  (TIMOPTIC ) 0.5 % ophthalmic solution, and LUMIGAN  0.01 % SOLN  Has the patient contacted their pharmacy? Yes   This is the patient's preferred pharmacy:  [facility] [address] [place] [address] [place]  [address] Phone: [phone] Fax: [phone]    Is this the correct pharmacy for this prescription? Yes If no, delete pharmacy and type the correct one.   Has the prescription been filled recently? No. Lumigan  was refilled on 12/21/23, timolol  refilled on 12/18/23 and quetiapine  refilled on 11/27/23  Is the patient out of the medication? Yes  Has the patient been seen for an appointment in the last year OR does the patient have an upcoming appointment? Yes  Can we respond through MyChart? No. Contact patient by phone at [phone]  Agent: Please be advised that Rx refills may take up to 3 business days. We ask that you follow-up with your pharmacy.

## 2024-02-29 NOTE — Telephone Encounter (Signed)
 I spoke with the patient and he has been scheduled for 03/15/2024 at 2:30 pm.

## 2024-02-29 NOTE — Telephone Encounter (Signed)
 Returned call. LVM to call office and discuss referral/in office appt.

## 2024-02-29 NOTE — Telephone Encounter (Signed)
 Thank you for the update!

## 2024-02-29 NOTE — Telephone Encounter (Signed)
 Quetiapine  appears as historical medication. Schedule appointment.

## 2024-03-01 ENCOUNTER — Telehealth: Payer: Self-pay | Admitting: Family

## 2024-03-01 ENCOUNTER — Ambulatory Visit: Payer: Self-pay

## 2024-03-01 NOTE — Telephone Encounter (Signed)
 Pt called asking about glaucoma medication refills as previously requested; Manuel Ortega spoke to patient and advised that his provider can send in a referral to ophthalmology so he can get his prescriptions. Pt was also advised that he may visit his nearest UC or ER if he needed prescription right away because [name] did not feel comfortable filling the medication for pt for his glaucoma. Pt refused and is frustrated that [name] will not fill his medication because his past provider from [facility] used to send his glaucoma eye drops to the pharmacy with no visit, as needed.  Pt stated he will look for another primary care provider because he was not happy.

## 2024-03-01 NOTE — Telephone Encounter (Signed)
 Copied from CRM [id number]. Topic: Clinical - Red Word Triage >> Mar 01, 2024 10:53 AM Manuel Ortega wrote: Red Word that prompted transfer to Nurse Triage: wanted appt asap. (Has appt 06/02) Says he has hd troble breathing, problems and needs appt   TRIAGE SUMMARY:   Manuel Ortega is calling in to have his pulmonology appointment expedited to an earlier date. The patient denies all symptoms at this time. No acute distress noted, no evidence of abnormality at this time during telephonic triage. Currently has an appointment with Dara Ear on June 3rd. NFN.   Patient asks if cancellations were to occur to please contact him at [phone] to be seen sooner.   E2C2 Pulmonary Triage - Initial Assessment Questions "Chief Complaint (e.g., cough, sob, wheezing, fever, chills, sweat or additional symptoms) *Go to specific symptom protocol after initial questions.  None, Denies Only asking for his appointment to be moved up.   "How long have symptoms been present?" -    MEDICINES:   "Have you used any OTC meds to help with symptoms?"  If yes, ask "What medications?" -  "Have you used your inhalers/maintenance medication?"  If yes, "What medications?" -  If inhaler, ask "How many puffs and how often?" Note: Review instructions on medication in the chart. -  OXYGEN: "Do you wear supplemental oxygen?"  If yes, "How many liters are you supposed to use?" -  "Do you monitor your oxygen levels?"  If yes, "What is your reading (oxygen level) today?" -  "What is your usual oxygen saturation reading?"  (Note: Pulmonary O2 sats should be 90% or greater) -

## 2024-03-01 NOTE — Telephone Encounter (Signed)
 FYI

## 2024-03-02 NOTE — Telephone Encounter (Signed)
 Called patient and appointment scheduled

## 2024-03-08 ENCOUNTER — Ambulatory Visit (INDEPENDENT_AMBULATORY_CARE_PROVIDER_SITE_OTHER): Admitting: Family

## 2024-03-08 VITALS — BP 137/85 | HR 98 | Temp 98.1°F | Resp 16 | Ht 73.0 in | Wt 204.6 lb

## 2024-03-08 DIAGNOSIS — H409 Unspecified glaucoma: Secondary | ICD-10-CM

## 2024-03-08 MED ORDER — TIMOLOL MALEATE 0.5 % OP SOLN: 1.0000 [drp] | Freq: Every morning | OPHTHALMIC | 0 refills | Status: DC

## 2024-03-08 MED ORDER — LUMIGAN 0.01 % OP SOLN: 1.0000 [drp] | Freq: Every day | OPHTHALMIC | 0 refills | Status: AC

## 2024-03-08 NOTE — Progress Notes (Unsigned)
 Patient ID: Manuel Ortega, male    DOB: 09-13-1951  MRN: [id number]  CC: Medication Refill  Subjective: Manuel Ortega is a 73 y.o. male who presents for medication refill.   His concerns today include:  Bilateral glaucoma. States he needs refills of Lumigan  ophthalmic solution and Timolol  ophthalmic solution. States he plans to schedule an appointment with [facility] soon. No further issues/concerns for discussion today.  Patient Active Problem List   Diagnosis Date Noted   Fever 02/08/2024   Pancreatic cyst 01/17/2024   Lung nodules 01/17/2024   Sepsis (HCC) 01/16/2024   ESRD on dialysis (HCC) 01/16/2024   Personal history of prostate cancer 11/16/2023   Mucosal abnormality of stomach 10/15/2023   Gastric polyp 10/15/2023   Duodenitis 10/15/2023   Duodenal ulcer 10/15/2023   Acute blood loss anemia 10/13/2023   Melanotic stools 10/13/2023   SOB (shortness of breath) 10/11/2023   Pulmonary nodule 10/09/2023   Pre-syncope 10/09/2023   Dyspnea on exertion 10/09/2023   Anxiety 10/09/2023   Pancreatic cyst 10/09/2023   Gastroesophageal reflux disease 09/18/2023   Disorder of phosphorus metabolism, unspecified 09/11/2023   Seasonal allergies 05/11/2023   Arthralgia of right knee 12/30/2022   Other disorders of phosphorus metabolism 07/14/2022   Diarrhea, unspecified 02/19/2022   Left knee pain 07/29/2021   Glaucoma 04/16/2021   Moderate recurrent major depression (HCC) 04/16/2021   Dependence on renal dialysis (HCC) 03/30/2021   Pruritus, unspecified 11/29/2020   Prostate cancer (HCC) 11/23/2020   ESRD on dialysis (HCC) 10/31/2020   Bladder outlet obstruction 09/13/2020   Secondary hyperparathyroidism of renal origin (HCC) 07/30/2020   COVID-19 07/16/2020   Iron  deficiency anemia, unspecified 06/26/2020   UTI (urinary tract infection) 06/12/2020   Essential hypertension 06/11/2020   Acute kidney injury (AKI) with acute tubular necrosis (ATN) (HCC) 06/11/2020    Gross hematuria 06/11/2020   Major depressive disorder 06/11/2020   D-dimer, elevated 06/11/2020   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   Prolonged QT interval 06/11/2020   Urinary tract infection associated with catheterization of urinary tract, initial encounter (HCC) 06/10/2020   Moderate protein-calorie malnutrition (HCC) 06/05/2020   Allergy, unspecified, sequela 06/02/2020   Anemia in chronic kidney disease 06/02/2020   Coagulation defect, unspecified (HCC) 06/02/2020   Encounter for immunization 06/02/2020   Pain, unspecified 06/02/2020   Personal history of anaphylaxis 06/02/2020   Acute kidney injury (HCC) 05/11/2020   Hypokalemia 05/11/2020   Hypocalcemia 05/11/2020   Hypomagnesemia 05/11/2020   Nausea and vomiting 05/11/2020   Primary open angle glaucoma (POAG) of both eyes, severe stage 08/13/2017     Current Outpatient Medications on File Prior to Visit  Medication Sig Dispense Refill   calcitRIOL  (ROCALTROL ) 0.25 MCG capsule Take 5 capsules (1.25 mcg total) by mouth Every Tuesday,Thursday,and Saturday with dialysis. 30 capsule 0   cinacalcet  (SENSIPAR ) 30 MG tablet Take 1 tablet (30 mg total) by mouth Every Tuesday,Thursday,and Saturday with dialysis. 60 tablet 0   ferrous sulfate  325 (65 FE) MG EC tablet Take 325 mg by mouth daily with breakfast.     Methoxy PEG-Epoetin Beta (MIRCERA IJ) 30 mcg.     montelukast  (SINGULAIR ) 10 MG tablet Take 10 mg by mouth daily.     pantoprazole  (PROTONIX ) 40 MG tablet Take 1 tablet (40 mg total) by mouth 2 (two) times daily. 60 tablet 1   QUEtiapine  Fumarate (SEROQUEL  XR) 150 MG 24 hr tablet Take 300 mg by mouth at bedtime.     sertraline  (ZOLOFT )  100 MG tablet Take 100 mg by mouth in the morning and at bedtime.     sevelamer  carbonate (RENVELA ) 800 MG tablet Take 1,600 mg by mouth 3 (three) times daily.     sildenafil (VIAGRA) 100 MG tablet Take 100 mg by mouth as needed for erectile dysfunction.     TYLENOL   500 MG tablet Take 500-1,000 mg by mouth every 6 (six) hours as needed for mild pain (pain score 1-3) or headache.     CREON  36000-114000 units CPEP capsule Take 36,000-72,000 Units by mouth See admin instructions. Take 72,000 units by mouth three time a day with meals and 36,000 units three times a day with snacks     diphenhydrAMINE  (BENADRYL ) 25 mg capsule Take 25 mg by mouth every 4 (four) hours as needed for itching or allergies.     mirtazapine  (REMERON ) 15 MG tablet Take 15 mg by mouth daily.     No current facility-administered medications on file prior to visit.    No Known Allergies  Social History   Socioeconomic History   Marital status: Divorced    Spouse name: Not on file   Number of children: Not on file   Years of education: Not on file   Highest education level: Not on file  Occupational History   Occupation: Art gallery manager  Tobacco Use   Smoking status: Never    Passive exposure: Never   Smokeless tobacco: Never  Vaping Use   Vaping status: Never Used  Substance and Sexual Activity   Alcohol use: Not Currently    Alcohol/week: 1.0 standard drink of alcohol    Types: 1 Glasses of wine per week    Comment: rare   Drug use: Not Currently   Sexual activity: Not Currently    Birth control/protection: None  Other Topics Concern   Not on file  Social History Narrative   ** Merged History Encounter **       Right handed Drinks caffeine Lives here in [place] for 6 years, came from [place] Lives alone   Social Drivers of Health   Financial Resource Strain: Low Risk  (11/10/2023)   Received from [facility]   Overall Financial Resource Strain (CARDIA)    Difficulty of Paying Living Expenses: Not hard at all  Food Insecurity: No Food Insecurity (02/12/2024)   Hunger Vital Sign    Worried About Running Out of Food in the Last Year: Never true    Ran Out of Food in the Last Year: Never true  Transportation Needs: No Transportation Needs (02/12/2024)   PRAPARE -  Administrator, Civil Service (Medical): No    Lack of Transportation (Non-Medical): No  Physical Activity: Sufficiently Active (08/21/2022)   [facility]   Exercise Vital Sign    Days of Exercise per Week: 4 days    Minutes of Exercise per Session: 50 min  Stress: No Stress Concern Present (02/02/2024)   Received from [facility] - Occupational Stress Questionnaire    Feeling of Stress : Only a little  Social Connections: Moderately Isolated (02/05/2024)   Social Connection and Isolation Panel [NHANES]    Frequency of Communication with Friends and Family: More than three times a week    Frequency of Social Gatherings with Friends and Family: More than three times a week    Attends Religious Services: Never    Database administrator or Organizations: No    Attends [employer]  or Organization Meetings: More than 4 times per year    Marital Status: Divorced  Intimate Partner Violence: Not At Risk (02/12/2024)   Humiliation, Afraid, Rape, and Kick questionnaire    Fear of Current or Ex-Partner: No    Emotionally Abused: No    Physically Abused: No    Sexually Abused: No    Family History  Problem Relation Age of Onset   Stroke Mother    Breast cancer Neg Hx    Colon cancer Neg Hx    Prostate cancer Neg Hx    Pancreatic cancer Neg Hx     Past Surgical History:  Procedure Laterality Date   APPENDECTOMY     BIOPSY  10/15/2023   Procedure: BIOPSY;  Surgeon: Ace Holder, MD;  Location: [facility] ENDOSCOPY;  Service: Gastroenterology;;   DIALYSIS/PERMA CATHETER INSERTION N/A 09/23/2023   Procedure: DIALYSIS/PERMA CATHETER INSERTION;  Surgeon: Patrick Boor, MD;  Location: [facility] INVASIVE CV LAB;  Service: Cardiovascular;  Laterality: N/A;   DIALYSIS/PERMA CATHETER REMOVAL N/A 09/23/2023   Procedure: DIALYSIS/PERMA CATHETER REMOVAL;  Surgeon: Patrick Boor, MD;  Location: [facility] INVASIVE CV LAB;  Service:  Cardiovascular;  Laterality: N/A;   ESOPHAGOGASTRODUODENOSCOPY (EGD) WITH PROPOFOL  N/A 10/15/2023   Procedure: ESOPHAGOGASTRODUODENOSCOPY (EGD) WITH PROPOFOL ;  Surgeon: Ace Holder, MD;  Location: [facility] ENDOSCOPY;  Service: Gastroenterology;  Laterality: N/A;   growth in nostril     biopsied negatiove   IR FLUORO GUIDE CV LINE RIGHT  05/12/2020   IR FLUORO GUIDE CV LINE RIGHT  05/28/2020   IR FLUORO GUIDE CV LINE RIGHT  02/09/2024   IR REMOVAL TUN CV CATH W/O FL  02/07/2024   IR US  GUIDE VASC ACCESS RIGHT  05/12/2020   IR US  GUIDE VASC ACCESS RIGHT  05/28/2020   IR US  GUIDE VASC ACCESS RIGHT  02/09/2024   TRANSURETHRAL RESECTION OF PROSTATE N/A 09/13/2020   Procedure: TRANSURETHRAL RESECTION OF THE PROSTATE (TURP) WITH CYSTOSCOPY;  Surgeon: Florencio Hunting, MD;  Location: [facility] ORS;  Service: Urology;  Laterality: N/A;    ROS: Review of Systems Negative except as stated above  PHYSICAL EXAM: BP 137/85   Pulse 98   Temp 98.1 F (36.7 C) (Oral)   Resp 16   Ht 6\' 1"  (1.854 m)   Wt 204 lb 9.6 oz (92.8 kg)   SpO2 97%   BMI 26.99 kg/m   Physical Exam HENT:     Head: Normocephalic and atraumatic.     Nose: Nose normal.     Mouth/Throat:     Mouth: Mucous membranes are moist.     Pharynx: Oropharynx is clear.  Eyes:     Extraocular Movements: Extraocular movements intact.     Conjunctiva/sclera: Conjunctivae normal.     Pupils: Pupils are equal, round, and reactive to light.  Cardiovascular:     Rate and Rhythm: Normal rate and regular rhythm.     Pulses: Normal pulses.     Heart sounds: Normal heart sounds.  Pulmonary:     Effort: Pulmonary effort is normal.     Breath sounds: Normal breath sounds.  Musculoskeletal:        General: Normal range of motion.     Cervical back: Normal range of motion and neck supple.  Neurological:     General: No focal deficit present.     Mental Status: He is alert and oriented to person, place, and time.  Psychiatric:        Mood and  Affect: Mood normal.  Behavior: Behavior normal.     ASSESSMENT AND PLAN: Note - Plan of care discussed with Abraham Abo, MD.   1. Glaucoma of both eyes, unspecified glaucoma type (Primary) - Continue Lumigan  ophthalmic solution and Timolol  ophthalmic solution as prescribed. Counseled on medication adherence/adverse effects. - I discussed with patient in detail no future refills of Lumigan  ophthalmic solution and Timolol  ophthalmic solution will be prescribed and the importance of scheduling an appointment with Ophthalmology for continue care. Patient verbalized understanding and agreement. - LUMIGAN  0.01 % SOLN; Place 1 drop into both eyes at bedtime.  Dispense: 7.5 mL; Refill: 0 - timolol  (TIMOPTIC ) 0.5 % ophthalmic solution; Place 1 drop into both eyes every morning.  Dispense: 15 mL; Refill: 0     Patient was given the opportunity to ask questions.  Patient verbalized understanding of the plan and was able to repeat key elements of the plan. Patient was given clear instructions to go to Emergency Department or return to medical center if symptoms don't improve, worsen, or new problems develop.The patient verbalized understanding.   No orders of the defined types were placed in this encounter.    Requested Prescriptions   Signed Prescriptions Disp Refills   LUMIGAN  0.01 % SOLN 7.5 mL 0    Sig: Place 1 drop into both eyes at bedtime.   timolol  (TIMOPTIC ) 0.5 % ophthalmic solution 15 mL 0    Sig: Place 1 drop into both eyes every morning.    Follow-up with primary provider as scheduled.  Senaida Dama, NP

## 2024-03-08 NOTE — Progress Notes (Unsigned)
 Patient is here for medication refill. Patient has no other concerns  today

## 2024-03-15 ENCOUNTER — Ambulatory Visit: Admitting: Acute Care

## 2024-03-15 ENCOUNTER — Encounter: Payer: Self-pay | Admitting: Acute Care

## 2024-03-15 VITALS — BP 155/82 | HR 100 | Ht 73.0 in | Wt 210.4 lb

## 2024-03-15 DIAGNOSIS — Z789 Other specified health status: Secondary | ICD-10-CM

## 2024-03-15 DIAGNOSIS — Z8261 Family history of arthritis: Secondary | ICD-10-CM

## 2024-03-15 DIAGNOSIS — R918 Other nonspecific abnormal finding of lung field: Secondary | ICD-10-CM

## 2024-03-15 NOTE — Progress Notes (Addendum)
 History of Present Illness Manuel Ortega is a 73 y.o. male with medical history significant for glaucoma, hypertension, prostate cancer, ESRD on Tuesday Thursday Saturday dialysis. He was recently admitted to the hospital for sepsis of unknown origin. He had fever at dialysis. His work up included a CT chest, which was significant for an incidental finding of several small  pulmonary nodules. He has been referred to Dr. Baldwin Ortega for evaluation and follow up of this finding.    Pt. Has consented to use of [employer]bridge soft wear to help capture the content of this OV    03/15/2024 Pt. Presents for pulmonary consult for evaluation of an incidentally found pulmonary nodule during a GI work up . He also had a  a recent hospital admission for  sepsis/ fever when seen for HD, as there was concern for a catheter infection. CT Chest was done which showed a stable 6 mm noncalcified pulmonary nodule within the lower lobes bilaterally.  Because the patient has been having a workup for an abnormal pancreatic finding patient was referred to pulmonary to have these nodules evaluated in that context.  The pancreatic nodule was evaluated with an endoscopic procedure, which showed it to be very small and not cancerous, and it was too small to biopsy.   A CT scan from [facility] in April 2025 showed a stable 6 mm noncalcified pulmonary nodule in the left lower lobe and a stable 6 mm subpleural nodule in the right lower lobe.  Patient is an Acupuncturist.  Patient has never smoked and denies significant exposure to secondhand smoke, chemicals, toxins or textiles. There is a family history of rheumatoid arthritis, however.  He is currently not experiencing any respiratory symptoms such as shortness of breath.  Looking back on his imaging history, the patient had a CT angio chest abdomen pelvis 10/09/2023.  On the scan there was notation of a calcified right lower lobe 7 mm nodule consistent with old granulomatous  disease, there was also notation of a 7 mm left lower lobe lung nodule also noncalcified.  Both of these nodules are currently measuring at 6 mm on most recent imaging.  As patient is low risk as a never smoker and there is a family history of autoimmune disease and the rheumatoid arthritis, I tend to lean towards sarcoidosis or a possible granulomatous disease.  While patient is totally asymptomatic he does have that family history.  He has never had a renal biopsy to determine etiology of his renal disease.  We did discuss the need for annual eye exams.  He is unaware of any dermatological issues.  He has had a recent 2D echo.  Plan will be for a 68-month follow-up CT chest to re-evaluate the nodules for stability.  The patient denies any hemoptysis or unexplained weight loss.  He is in agreement with the above-noted plan for follow-up.  Test Results: CT Abdomen , Chest, Pelvis Stable 6 mm noncalcified pulmonary nodules within the lower lobes bilaterally. Follow-up evaluation in 12-18 months is recommended to confirm stability and an underlying benign etiology.  09/2023 CTA Chest Abdomen Pelvis No consolidation, pneumothorax or effusion. Minimal areas of scarring atelectatic changes identified. Mild lower lung bronchiectasis on the right. There is a calcified nodule right lower lobe on series 6, image 104 consistent with old granulomatous disease. Slight basilar atelectasis or scar as well. Noncalcified right lower lobe lung nodule measures 7 mm on series 6, image 93. 7 mm left lower lobe lung nodule noncalcified series  6, image 77 as well.    Latest Ref Rng & Units 02/10/2024    3:17 AM 02/09/2024    3:26 AM 02/08/2024    3:34 AM  CBC  WBC 4.0 - 10.5 K/uL 10.0  8.6  10.3   Hemoglobin 13.0 - 17.0 g/dL 82.9  9.7  9.8   Hematocrit 39.0 - 52.0 % 32.0  29.1  30.4   Platelets 150 - 400 K/uL 303  288  274        Latest Ref Rng & Units 02/10/2024    3:17 AM 02/09/2024    3:26 AM 02/05/2024     7:42 AM  BMP  Glucose 70 - 99 mg/dL 60  77  97   BUN 8 - 23 mg/dL 35  78  56   Creatinine 0.61 - 1.24 mg/dL 5.62  1.30  8.65   Sodium 135 - 145 mmol/L 135  136  138   Potassium 3.5 - 5.1 mmol/L 3.7  3.9  4.0   Chloride 98 - 111 mmol/L 100  103  101   CO2 22 - 32 mmol/L 26  20  23    Calcium  8.9 - 10.3 mg/dL 9.0  9.2  9.1     BNP    Component Value Date/Time   BNP 53.3 06/10/2020 1825    ProBNP No results found for: "PROBNP"  PFT No results found for: "FEV1PRE", "FEV1POST", "FVCPRE", "FVCPOST", "TLC", "DLCOUNC", "PREFEV1FVCRT", "PSTFEV1FVCRT"  No results found.   Past medical hx Past Medical History:  Diagnosis Date   AKI (acute kidney injury) (HCC) 05/2020   possibly due to prostate blockage   Anxiety    Arthritis    Depression    Dyspnea    Glaucoma    Hypertension    Prostate cancer (HCC)    Renal disorder    Dialysis T/th/sat     Social History   Tobacco Use   Smoking status: Never    Passive exposure: Never   Smokeless tobacco: Never  Vaping Use   Vaping status: Never Used  Substance Use Topics   Alcohol use: Not Currently    Alcohol/week: 1.0 standard drink of alcohol    Types: 1 Glasses of wine per week    Comment: rare   Drug use: Not Currently    Manuel Ortega reports that he has never smoked. He has never been exposed to tobacco smoke. He has never used smokeless tobacco. He reports that he does not currently use alcohol after a past usage of about 1.0 standard drink of alcohol per week. He reports that he does not currently use drugs.  Tobacco Cessation: Never smoker    Past surgical hx, Family hx, Social hx all reviewed.  Current Outpatient Medications on File Prior to Visit  Medication Sig   calcitRIOL  (ROCALTROL ) 0.25 MCG capsule Take 5 capsules (1.25 mcg total) by mouth Every Tuesday,Thursday,and Saturday with dialysis.   cinacalcet  (SENSIPAR ) 30 MG tablet Take 1 tablet (30 mg total) by mouth Every Tuesday,Thursday,and Saturday with  dialysis.   CREON  36000-114000 units CPEP capsule Take 36,000-72,000 Units by mouth See admin instructions. Take 72,000 units by mouth three time a day with meals and 36,000 units three times a day with snacks   diphenhydrAMINE  (BENADRYL ) 25 mg capsule Take 25 mg by mouth every 4 (four) hours as needed for itching or allergies.   ferrous sulfate  325 (65 FE) MG EC tablet Take 325 mg by mouth daily with breakfast.   LUMIGAN  0.01 % SOLN Place 1  drop into both eyes at bedtime.   Methoxy PEG-Epoetin Beta (MIRCERA IJ) 30 mcg.   mirtazapine  (REMERON ) 15 MG tablet Take 15 mg by mouth daily.   montelukast  (SINGULAIR ) 10 MG tablet Take 10 mg by mouth daily.   pantoprazole  (PROTONIX ) 40 MG tablet Take 1 tablet (40 mg total) by mouth 2 (two) times daily.   QUEtiapine  Fumarate (SEROQUEL  XR) 150 MG 24 hr tablet Take 300 mg by mouth at bedtime.   sertraline  (ZOLOFT ) 100 MG tablet Take 100 mg by mouth in the morning and at bedtime.   sevelamer  carbonate (RENVELA ) 800 MG tablet Take 1,600 mg by mouth 3 (three) times daily.   sildenafil (VIAGRA) 100 MG tablet Take 100 mg by mouth as needed for erectile dysfunction.   timolol  (TIMOPTIC ) 0.5 % ophthalmic solution Place 1 drop into both eyes every morning.   TYLENOL  500 MG tablet Take 500-1,000 mg by mouth every 6 (six) hours as needed for mild pain (pain score 1-3) or headache.   No current facility-administered medications on file prior to visit.     No Known Allergies  Review Of Systems:  Constitutional:   No  weight loss, night sweats,  Fevers, chills, fatigue, or  lassitude.  HEENT:   No headaches,  Difficulty swallowing,  Tooth/dental problems, or  Sore throat,                No sneezing, itching, ear ache, nasal congestion, post nasal drip,   CV:  No chest pain,  Orthopnea, PND, swelling in lower extremities, anasarca, dizziness, palpitations, syncope.   GI  No heartburn, indigestion, abdominal pain, nausea, vomiting, diarrhea, change in bowel  habits, loss of appetite, bloody stools.   Resp: No shortness of breath with exertion or at rest.  No excess mucus, no productive cough,  No non-productive cough,  No coughing up of blood.  No change in color of mucus.  No wheezing.  No chest wall deformity  Skin: no rash or lesions.  GU: no dysuria, change in color of urine, no urgency or frequency.  No flank pain, no hematuria   MS:  No joint pain or swelling.  No decreased range of motion.  No back pain.  Psych:  No change in mood or affect. No depression or anxiety.  No memory loss.   Vital Signs BP (!) 155/82 (BP Location: Left Arm, Patient Position: Sitting, Cuff Size: Large)   Pulse 100   Ht 6\' 1"  (1.854 m)   Wt 210 lb 6.4 oz (95.4 kg)   SpO2 98%   BMI 27.76 kg/m    Physical Exam:  General- No distress,  A&Ox3, pleasant ENT: No sinus tenderness, TM clear, pale nasal mucosa, no oral exudate,no post nasal drip, no LAN Cardiac: S1, S2, regular rate and rhythm, no murmur Chest: No wheeze/ rales/ dullness; no accessory muscle use, no nasal flaring, no sternal retractions Abd.: Soft Non-tender, ND, BS +, Body mass index is 27.76 kg/m.  Ext: No clubbing cyanosis, edema Neuro:  normal strength, MAE x 4, A&O x 3, appropriate Skin: No rashes, warm and dry, no lesions  Psych: normal mood and behavior   Assessment/Plan Small bilateral lower lobe pulmonary nodules found incidentally during GI workups Never smoker Family history of rheumatoid arthritis No occupational exposures Plan We will do a 6 month follow up scan to monitor the two lung nodules in the bases of your lungs.  This will be due 07/2024. You will get a call to get this scheduled  closer to the time. You will follow up with me 1-2 weeks after the scan to review the results and determine next best steps.  Call if you need us  sooner.  Please contact office for sooner follow up if symptoms do not improve or worsen or seek emergency care    I spent 20 minutes  dedicated to the care of this patient on the date of this encounter to include pre-visit review of records, face-to-face time with the patient discussing conditions above, post visit ordering of testing, clinical documentation with the electronic health record, making appropriate referrals as documented, and communicating necessary information to the patient's healthcare team.   Raejean Bullock, NP 03/15/2024  4:49 PM

## 2024-03-15 NOTE — Patient Instructions (Signed)
 It is good to see you today. We will do a 6 month follow up scan to monitor the two lung nodules in the bases of your lungs.  This will be due 07/2024. You will get a call to get this scheduled  closer to the time. You will follow up with me 1-2 weeks after the scan to review the results and determine next best steps.  Call if you need us  sooner.  Please contact office for sooner follow up if symptoms do not improve or worsen or seek emergency care

## 2024-03-23 ENCOUNTER — Ambulatory Visit: Admitting: Pulmonary Disease

## 2024-03-25 ENCOUNTER — Telehealth: Payer: Self-pay | Admitting: Family

## 2024-03-25 NOTE — Telephone Encounter (Signed)
 Copied from CRM [id number]. Topic: General - Other >> Mar 25, 2024  9:01 AM Tiffany S wrote: Reason for CRM: Patient asking if he can get a referral to GI within [facility] health please follow up with patient

## 2024-03-28 ENCOUNTER — Other Ambulatory Visit: Payer: Self-pay | Admitting: Family

## 2024-03-28 DIAGNOSIS — Z1211 Encounter for screening for malignant neoplasm of colon: Secondary | ICD-10-CM

## 2024-03-28 NOTE — Telephone Encounter (Signed)
 Complete

## 2024-03-29 ENCOUNTER — Ambulatory Visit: Admitting: Family

## 2024-03-29 NOTE — Telephone Encounter (Signed)
 I called patient to make him aware referral was sent in no one answered so I left a voicemail to return my call.

## 2024-03-31 NOTE — Telephone Encounter (Signed)
 I spoke with patient and made them aware of their recommendations per PCP.  Patient verbalized understanding

## 2024-04-04 ENCOUNTER — Encounter (HOSPITAL_COMMUNITY)
Admission: RE | Disposition: A | Discharge status: Home / Self Care | Payer: Self-pay | Source: Home / Self Care | Attending: Vascular Surgery

## 2024-04-04 ENCOUNTER — Encounter (HOSPITAL_COMMUNITY): Payer: Self-pay | Admitting: Vascular Surgery

## 2024-04-04 ENCOUNTER — Ambulatory Visit (HOSPITAL_COMMUNITY)
Admission: RE | Admit: 2024-04-04 | Discharge: 2024-04-04 | Disposition: A | Discharge status: Home / Self Care | Attending: Vascular Surgery | Admitting: Vascular Surgery

## 2024-04-04 DIAGNOSIS — I120 Hypertensive chronic kidney disease with stage 5 chronic kidney disease or end stage renal disease: Secondary | ICD-10-CM | POA: Insufficient documentation

## 2024-04-04 DIAGNOSIS — T82898A Other specified complication of vascular prosthetic devices, implants and grafts, initial encounter: Secondary | ICD-10-CM

## 2024-04-04 DIAGNOSIS — N186 End stage renal disease: Secondary | ICD-10-CM | POA: Diagnosis not present

## 2024-04-04 DIAGNOSIS — Z992 Dependence on renal dialysis: Secondary | ICD-10-CM | POA: Diagnosis not present

## 2024-04-04 HISTORY — PX: TUNNELLED CATHETER EXCHANGE: CATH118373

## 2024-04-04 SURGERY — TUNNELLED CATHETER EXCHANGE
Anesthesia: LOCAL

## 2024-04-04 MED ORDER — HEPARIN (PORCINE) IN NACL 1000-0.9 UT/500ML-% IV SOLN
INTRAVENOUS | Status: DC | PRN
Start: 2024-04-04 — End: 2024-04-04
  Administered 2024-04-04: 500 mL

## 2024-04-04 MED ORDER — LIDOCAINE HCL (PF) 1 % IJ SOLN
INTRAMUSCULAR | Status: AC
  Filled 2024-04-04: qty 30

## 2024-04-04 MED ORDER — LIDOCAINE HCL (PF) 1 % IJ SOLN
INTRAMUSCULAR | Status: DC | PRN
Start: 2024-04-04 — End: 2024-04-04
  Administered 2024-04-04 (×2): 10 mL via INTRADERMAL

## 2024-04-04 MED ORDER — IODIXANOL 320 MG/ML IV SOLN
INTRAVENOUS | Status: DC | PRN
  Administered 2024-04-04: 5 mL

## 2024-04-04 MED ORDER — HEPARIN SODIUM (PORCINE) 1000 UNIT/ML IJ SOLN
INTRAMUSCULAR | Status: AC
  Filled 2024-04-04: qty 10

## 2024-04-04 SURGICAL SUPPLY — 4 items
CATH PALINDROME-P 23 W/VT (CATHETERS) IMPLANT
GUIDEWIRE LT ZIPWIRE 035X260 (WIRE) IMPLANT
KIT PV (KITS) ×1 IMPLANT
TRAY PV CATH (CUSTOM PROCEDURE TRAY) ×1 IMPLANT

## 2024-04-04 NOTE — H&P (Signed)
 [facility] OF [place]  ASSESSMENT / PLAN: 73 y.o. male with ESRD dialyzing via RIJ TDC. This catheter is not working well. Plan catheter exchange today in cath lab  CHIEF COMPLAINT: ESRD, poorly functioning catheter  HISTORY OF PRESENT ILLNESS: Manuel Ortega is a 73 y.o. 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 This catheter is not working well. He will not accept permanent dialysis access in his arm. He is understanding of the risks of catheter injection, etc.    Past Medical History:  Diagnosis Date   AKI (acute kidney injury) (HCC) 05/2020   possibly due to prostate blockage   Anxiety    Arthritis    Depression    Dyspnea    Glaucoma    Hypertension    Prostate cancer [facility])    Renal disorder    Dialysis T/th/sat    Past Surgical History:  Procedure Laterality Date   APPENDECTOMY     BIOPSY  10/15/2023   Procedure: BIOPSY;  Surgeon: Leigh Elspeth SQUIBB, MD;  Location: [facility] ENDOSCOPY;  Service: Gastroenterology;;   DIALYSIS/PERMA CATHETER INSERTION N/A 09/23/2023   Procedure: DIALYSIS/PERMA CATHETER INSERTION;  Surgeon: Melia Lynwood ORN, MD;  Location: [facility] INVASIVE CV LAB;  Service: Cardiovascular;  Laterality: N/A;   DIALYSIS/PERMA CATHETER REMOVAL N/A 09/23/2023   Procedure: DIALYSIS/PERMA CATHETER REMOVAL;  Surgeon: Melia Lynwood ORN, MD;  Location: [facility] INVASIVE CV LAB;  Service: Cardiovascular;  Laterality: N/A;   ESOPHAGOGASTRODUODENOSCOPY (EGD) WITH PROPOFOL  N/A 10/15/2023   Procedure: ESOPHAGOGASTRODUODENOSCOPY (EGD) WITH PROPOFOL ;  Surgeon: Leigh Elspeth SQUIBB, MD;  Location: [facility] ENDOSCOPY;  Service: Gastroenterology;  Laterality: N/A;   growth in nostril     biopsied negatiove   IR FLUORO GUIDE CV LINE RIGHT  05/12/2020   IR FLUORO GUIDE CV LINE RIGHT  05/28/2020   IR FLUORO GUIDE CV LINE RIGHT  02/09/2024   IR REMOVAL TUN CV CATH W/O FL  02/07/2024   IR US  GUIDE VASC ACCESS RIGHT  05/12/2020   IR US  GUIDE VASC ACCESS RIGHT  05/28/2020   IR US   GUIDE VASC ACCESS RIGHT  02/09/2024   TRANSURETHRAL RESECTION OF PROSTATE N/A 09/13/2020   Procedure: TRANSURETHRAL RESECTION OF THE PROSTATE (TURP) WITH CYSTOSCOPY;  Surgeon: Renda Glance, MD;  Location: [facility] ORS;  Service: Urology;  Laterality: N/A;    Family History  Problem Relation Age of Onset   Stroke Mother    Breast cancer Neg Hx    Colon cancer Neg Hx    Prostate cancer Neg Hx    Pancreatic cancer Neg Hx     Social History   Socioeconomic History   Marital status: Divorced    Spouse name: Not on file   Number of children: Not on file   Years of education: Not on file   Highest education level: Not on file  Occupational History   Occupation: Art gallery manager  Tobacco Use   Smoking status: Never    Passive exposure: Never   Smokeless tobacco: Never  Vaping Use   Vaping status: Never Used  Substance and Sexual Activity   Alcohol use: Not Currently    Alcohol/week: 1.0 standard drink of alcohol    Types: 1 Glasses of wine per week    Comment: rare   Drug use: Not Currently   Sexual activity: Not Currently    Birth control/protection: None  Other Topics Concern   Not on file  Social History Narrative   ** Merged History Encounter **       Right  handed Drinks caffeine Lives here in [place] for 6 years, came from [place] Lives alone   Social Drivers of Health   Financial Resource Strain: Low Risk  (11/10/2023)   Received from [facility]   Overall Financial Resource Strain (CARDIA)    Difficulty of Paying Living Expenses: Not hard at all  Food Insecurity: Low Risk  (03/14/2024)   Received from [facility]   Hunger Vital Sign    Within the past 12 months, you worried that your food would run out before you got money to buy more: Never true    Within the past 12 months, the food you bought just didn't last and you didn't have money to get more. : Never true  Transportation Needs: No Transportation Needs (03/14/2024)   Received from [employer]    In the past 12 months, has lack of reliable transportation kept you from medical appointments, meetings, work or from getting things needed for daily living? : No  Physical Activity: Sufficiently Active (08/21/2022)   Received from [facility]   Exercise Vital Sign    On average, how many days per week do you engage in moderate to strenuous exercise (like a brisk walk)?: 4 days    On average, how many minutes do you engage in exercise at this level?: 50 min  Stress: No Stress Concern Present (02/02/2024)   Received from [facility]ational [facility] - Occupational Stress Questionnaire    Feeling of Stress : Only a little  Social Connections: Moderately Isolated (02/05/2024)   Social Connection and Isolation Panel    Frequency of Communication with Friends and Family: More than three times a week    Frequency of Social Gatherings with Friends and Family: More than three times a week    Attends Religious Services: Never    Database administrator or Organizations: No    Attends Engineer, structural: More than 4 times per year    Marital Status: Divorced  Catering manager Violence: Not At Risk (02/12/2024)   Humiliation, Afraid, Rape, and Kick questionnaire    Fear of Current or Ex-Partner: No    Emotionally Abused: No    Physically Abused: No    Sexually Abused: No    No Known Allergies  No current facility-administered medications for this encounter.    PHYSICAL EXAM Vitals:   04/04/24 0827 04/04/24 0844  BP: (!) 189/103 (!) 189/103  Pulse: 92 88  Resp: 12 12  Temp: 98.7 F (37.1 C)   TempSrc: Oral   SpO2: 97% 98%   Well appearing elderly man in no distress Regular rate and rhythm Unlabored breathing TDC in right chest without evidence of infection  PERTINENT LABORATORY AND RADIOLOGIC DATA  Most recent CBC    Latest Ref Rng & Units 02/10/2024    3:17 AM 02/09/2024    3:26 AM 02/08/2024    3:34 AM  CBC  WBC 4.0 -  10.5 K/uL 10.0  8.6  10.3   Hemoglobin 13.0 - 17.0 g/dL 89.3  9.7  9.8   Hematocrit 39.0 - 52.0 % 32.0  29.1  30.4   Platelets 150 - 400 K/uL 303  288  274      Most recent CMP    Latest Ref Rng & Units 02/10/2024    3:17 AM 02/09/2024    3:26 AM 02/05/2024    7:42 AM  CMP  Glucose 70 - 99 mg/dL 60  77  97   BUN 8 - 23 mg/dL 35  78  56   Creatinine 0.61 - 1.24 mg/dL 4.81  1.52  2.72   Sodium 135 - 145 mmol/L 135  136  138   Potassium 3.5 - 5.1 mmol/L 3.7  3.9  4.0   Chloride 98 - 111 mmol/L 100  103  101   CO2 22 - 32 mmol/L 26  20  23    Calcium  8.9 - 10.3 mg/dL 9.0  9.2  9.1    Hgb J8r MFr Bld (%)  Date Value  05/14/2023 5.6    LDL Cholesterol  Date Value Ref Range Status  05/14/2023 72 0 - 99 mg/dL Final     Debby SAILOR. Magda, MD FACS [facility] Phone Number: [phone] 04/04/2024 9:57 AM   Total time spent on preparing this encounter including chart review, data review, collecting history, examining the patient, and coordinating care: 30 minutes.  Portions of this report may have been transcribed using voice recognition software.  Every effort has been made to ensure accuracy; however, inadvertent computerized transcription errors may still be present.

## 2024-04-04 NOTE — Op Note (Signed)
 DATE OF SERVICE: 04/04/2024  PATIENT:  Manuel Ortega  73 y.o. male  PRE-OPERATIVE DIAGNOSIS:  ESRD  POST-OPERATIVE DIAGNOSIS:  Same  PROCEDURE:   1) tunneled dialysis catheter exchange (CPT [phone]) 2) Superior vena cava venogram (CPT [phone]) 3) outpatient new patient evaluation - level 3 (CPT 99213)  SURGEON:  Surgeons and Role:    * Magda Debby SAILOR, MD - Primary  ASSISTANT: none  ANESTHESIA:   local  EBL: minimal  BLOOD ADMINISTERED:none  DRAINS: none   LOCAL MEDICATIONS USED:  LIDOCAINE    SPECIMEN:  none  COUNTS: confirmed correct.  TOURNIQUET:  none  PATIENT DISPOSITION:  PACU - hemodynamically stable.   Delay start of Pharmacological VTE agent (>24hrs) due to surgical blood loss or risk of bleeding: no  INDICATION FOR PROCEDURE: Manuel Ortega is a 73 y.o. male with ESRD and malfunctioning TDC. After careful discussion of risks, benefits, and alternatives the patient was offered [facility] exchange. The patient understood and wished to proceed.  OPERATIVE FINDINGS: no central stenosis or fibrin sheath to explain catheter dysfunction on   DESCRIPTION OF PROCEDURE: After identification of the patient in the pre-operative holding area, the patient was transferred to the operating room. The patient was positioned supine on the operating room table. Anesthesia was induced. The right chest was prepped and draped in standard fashion. A surgical pause was performed confirming correct patient, procedure, and operative location.  Generous infiltration 1% lidocaine  was made around the catheter in its cuff.  The cuff was freed from the subcutaneous tissue using Metzenbaum scissor dissection.  The catheter was freed.  A central venogram was performed.  No central venous stenosis was identified.  A zip wire was introduced into the right ventricle.  The catheter was removed over the wire.  A new 23 cm palindrome tunneled dialysis catheter was introduced over the wire with the assistance of  stylette's.  The old catheter was examined on the back table.  No mechanical problems or fibrin sheath was identified.  The new catheter was introduced into position and the stylette is removed.  The catheter was tested.  It was found to function well.  The catheter was sewn in to the chest wall using 2-0 nylon suture.  Dermabond was applied to the exit site.  Upon completion of the case instrument and sharps counts were confirmed correct. The patient was transferred to the PACU in good condition. I was present for all portions of the procedure.  FOLLOW UP PLAN: follow up as needed  Debby SAILOR. Magda, MD [facility] of [facility] Phone Number: [phone] 04/04/2024 10:33 AM

## 2024-05-14 DIAGNOSIS — N2581 Secondary hyperparathyroidism of renal origin: Secondary | ICD-10-CM | POA: Diagnosis not present

## 2024-05-14 DIAGNOSIS — N186 End stage renal disease: Secondary | ICD-10-CM | POA: Diagnosis not present

## 2024-05-14 DIAGNOSIS — Z992 Dependence on renal dialysis: Secondary | ICD-10-CM | POA: Diagnosis not present

## 2024-05-17 ENCOUNTER — Ambulatory Visit: Payer: Self-pay

## 2024-05-17 ENCOUNTER — Ambulatory Visit (INDEPENDENT_AMBULATORY_CARE_PROVIDER_SITE_OTHER): Admitting: Family Medicine

## 2024-05-17 ENCOUNTER — Encounter: Payer: Self-pay | Admitting: Family Medicine

## 2024-05-17 VITALS — BP 142/92 | HR 101 | Ht 73.0 in | Wt 204.4 lb

## 2024-05-17 DIAGNOSIS — I10 Essential (primary) hypertension: Secondary | ICD-10-CM

## 2024-05-17 DIAGNOSIS — H409 Unspecified glaucoma: Secondary | ICD-10-CM | POA: Diagnosis not present

## 2024-05-17 DIAGNOSIS — Z992 Dependence on renal dialysis: Secondary | ICD-10-CM | POA: Diagnosis not present

## 2024-05-17 DIAGNOSIS — N186 End stage renal disease: Secondary | ICD-10-CM | POA: Diagnosis not present

## 2024-05-17 DIAGNOSIS — N2581 Secondary hyperparathyroidism of renal origin: Secondary | ICD-10-CM | POA: Diagnosis not present

## 2024-05-17 MED ORDER — TRAVOPROST (BAK FREE) 0.004 % OP SOLN: 1.0000 [drp] | Freq: Every day | OPHTHALMIC | 0 refills | Status: AC

## 2024-05-17 MED ORDER — TIMOLOL MALEATE 0.5 % OP SOLN: 1.0000 [drp] | Freq: Every morning | OPHTHALMIC | 0 refills | Status: AC

## 2024-05-17 NOTE — Progress Notes (Unsigned)
 Established Patient Office Visit  Subjective    Patient ID: Manuel Ortega, male    DOB: 1951/06/06  Age: 73 y.o. MRN: [id number]  CC:  Chief Complaint  Patient presents with  . Medical Management of Chronic Issues    Pt reports high blood pressure for a few days. Also reports some right sided abdominal pain    HPI Manuel Ortega presents for follow up of hypertension. He is a dialysis patient that has been at a new center for the past couple of weeks. He has noted the elevated readings since the change.   Outpatient Encounter Medications as of 05/17/2024  Medication Sig  . calcitRIOL  (ROCALTROL ) 0.25 MCG capsule Take 5 capsules (1.25 mcg total) by mouth Every Tuesday,Thursday,and Saturday with dialysis.  . cinacalcet  (SENSIPAR ) 30 MG tablet Take 1 tablet (30 mg total) by mouth Every Tuesday,Thursday,and Saturday with dialysis.  . CREON  36000-114000 units CPEP capsule Take 36,000-72,000 Units by mouth See admin instructions. Take 72,000 units by mouth three time a day with meals and 36,000 units three times a day with snacks  . diphenhydrAMINE  (BENADRYL ) 25 mg capsule Take 25 mg by mouth every 4 (four) hours as needed for itching or allergies.  . ferrous sulfate  325 (65 FE) MG EC tablet Take 325 mg by mouth daily with breakfast.  . LUMIGAN  0.01 % SOLN Place 1 drop into both eyes at bedtime.  . Methoxy PEG-Epoetin Beta (MIRCERA IJ) 30 mcg.  . mirtazapine  (REMERON ) 15 MG tablet Take 15 mg by mouth daily.  . montelukast  (SINGULAIR ) 10 MG tablet Take 10 mg by mouth daily.  . pantoprazole  (PROTONIX ) 40 MG tablet Take 1 tablet (40 mg total) by mouth 2 (two) times daily.  . QUEtiapine  Fumarate (SEROQUEL  XR) 150 MG 24 hr tablet Take 300 mg by mouth at bedtime.  . sertraline  (ZOLOFT ) 100 MG tablet Take 100 mg by mouth in the morning and at bedtime.  . sevelamer  carbonate (RENVELA ) 800 MG tablet Take 1,600 mg by mouth 3 (three) times daily.  . sildenafil (VIAGRA) 100 MG tablet Take 100 mg by  mouth as needed for erectile dysfunction.  . Travoprost , BAK Free, (TRAVATAN ) 0.004 % SOLN ophthalmic solution Place 1 drop into both eyes at bedtime.  . TYLENOL  500 MG tablet Take 500-1,000 mg by mouth every 6 (six) hours as needed for mild pain (pain score 1-3) or headache.  . [DISCONTINUED] timolol  (TIMOPTIC ) 0.5 % ophthalmic solution Place 1 drop into both eyes every morning.  . timolol  (TIMOPTIC ) 0.5 % ophthalmic solution Place 1 drop into both eyes every morning.   No facility-administered encounter medications on file as of 05/17/2024.    Past Medical History:  Diagnosis Date  . AKI (acute kidney injury) (HCC) 05/2020   possibly due to prostate blockage  . Anxiety   . Arthritis   . Depression   . Dyspnea   . Glaucoma   . Hypertension   . Prostate cancer (HCC)   . Renal disorder    Dialysis T/th/sat    Past Surgical History:  Procedure Laterality Date  . APPENDECTOMY    . BIOPSY  10/15/2023   Procedure: BIOPSY;  Surgeon: Leigh Elspeth SQUIBB, MD;  Location: [facility] ENDOSCOPY;  Service: Gastroenterology;;  . DIALYSIS/PERMA CATHETER INSERTION N/A 09/23/2023   Procedure: DIALYSIS/PERMA CATHETER INSERTION;  Surgeon: Melia Lynwood ORN, MD;  Location: [facility] INVASIVE CV LAB;  Service: Cardiovascular;  Laterality: N/A;  . DIALYSIS/PERMA CATHETER REMOVAL N/A 09/23/2023   Procedure: DIALYSIS/PERMA CATHETER REMOVAL;  Surgeon: Melia,  Lynwood ORN, MD;  Location: [facility] INVASIVE CV LAB;  Service: Cardiovascular;  Laterality: N/A;  . ESOPHAGOGASTRODUODENOSCOPY (EGD) WITH PROPOFOL  N/A 10/15/2023   Procedure: ESOPHAGOGASTRODUODENOSCOPY (EGD) WITH PROPOFOL ;  Surgeon: Leigh Elspeth SQUIBB, MD;  Location: [facility] ENDOSCOPY;  Service: Gastroenterology;  Laterality: N/A;  . growth in nostril     biopsied negatiove  . IR FLUORO GUIDE CV LINE RIGHT  05/12/2020  . IR FLUORO GUIDE CV LINE RIGHT  05/28/2020  . IR FLUORO GUIDE CV LINE RIGHT  02/09/2024  . IR REMOVAL TUN CV CATH W/O FL  02/07/2024  . IR US  GUIDE VASC ACCESS  RIGHT  05/12/2020  . IR US  GUIDE VASC ACCESS RIGHT  05/28/2020  . IR US  GUIDE VASC ACCESS RIGHT  02/09/2024  . TRANSURETHRAL RESECTION OF PROSTATE N/A 09/13/2020   Procedure: TRANSURETHRAL RESECTION OF THE PROSTATE (TURP) WITH CYSTOSCOPY;  Surgeon: Renda Glance, MD;  Location: [facility] ORS;  Service: Urology;  Laterality: N/A;  . TUNNELLED CATHETER EXCHANGE N/A 04/04/2024   Procedure: TUNNELLED CATHETER EXCHANGE;  Surgeon: Magda Debby SAILOR, MD;  Location: [facility] LAB;  Service: Cardiovascular;  Laterality: N/A;    Family History  Problem Relation Age of Onset  . Stroke Mother   . Breast cancer Neg Hx   . Colon cancer Neg Hx   . Prostate cancer Neg Hx   . Pancreatic cancer Neg Hx     Social History   Socioeconomic History  . Marital status: Divorced    Spouse name: Not on file  . Number of children: Not on file  . Years of education: Not on file  . Highest education level: Not on file  Occupational History  . Occupation: Art gallery manager  Tobacco Use  . Smoking status: Never    Passive exposure: Never  . Smokeless tobacco: Never  Vaping Use  . Vaping status: Never Used  Substance and Sexual Activity  . Alcohol use: Not Currently    Alcohol/week: 1.0 standard drink of alcohol    Types: 1 Glasses of wine per week    Comment: rare  . Drug use: Not Currently  . Sexual activity: Not Currently    Birth control/protection: None  Other Topics Concern  . Not on file  Social History Narrative   ** Merged History Encounter **       Right handed Drinks caffeine Lives here in [place] for 6 years, came from [place] Lives alone   Social Drivers of Health   Financial Resource Strain: Low Risk  (11/10/2023)   Received from [facility]   Overall Financial Resource Strain (CARDIA)   . Difficulty of Paying Living Expenses: Not hard at all  Food Insecurity: Low Risk  (03/14/2024)   Received from [facility]   Hunger Vital Sign   . Within the past 12 months, you worried that your food  would run out before you got money to buy more: Never true   . Within the past 12 months, the food you bought just didn't last and you didn't have money to get more. : Never true  Transportation Needs: No Transportation Needs (03/14/2024)   Received from [employer]   . In the past 12 months, has lack of reliable transportation kept you from medical appointments, meetings, work or from getting things needed for daily living? : No  Physical Activity: Sufficiently Active (08/21/2022)   Received from [facility]   Exercise Vital Sign   . On average, how many days per week do you engage in  moderate to strenuous exercise (like a brisk walk)?: 4 days   . On average, how many minutes do you engage in exercise at this level?: 50 min  Stress: No Stress Concern Present (02/02/2024)   Received from [facility] of Occupational Health - Occupational Stress Questionnaire   . Feeling of Stress : Only a little  Social Connections: Moderately Isolated (02/05/2024)   Social Connection and Isolation Panel   . Frequency of Communication with Friends and Family: More than three times a week   . Frequency of Social Gatherings with Friends and Family: More than three times a week   . Attends Religious Services: Never   . Active Member of Clubs or Organizations: No   . Attends Banker Meetings: More than 4 times per year   . Marital Status: Divorced  Catering manager Violence: Not At Risk (02/12/2024)   Humiliation, Afraid, Rape, and Kick questionnaire   . Fear of Current or Ex-Partner: No   . Emotionally Abused: No   . Physically Abused: No   . Sexually Abused: No    Review of Systems  All other systems reviewed and are negative.       Objective    BP (!) 142/92   Pulse (!) 101   Ht 6' 1 (1.854 m)   Wt 204 lb 6.4 oz (92.7 kg)   SpO2 96%   BMI 26.97 kg/m   Physical Exam Vitals and nursing note reviewed.  Constitutional:      General: He is  not in acute distress. Cardiovascular:     Rate and Rhythm: Normal rate and regular rhythm.  Pulmonary:     Effort: Pulmonary effort is normal.     Breath sounds: Normal breath sounds.  Neurological:     General: No focal deficit present.     Mental Status: He is alert and oriented to person, place, and time.         Assessment & Plan:    1. Essential hypertension (Primary) Patient defers any change of management at this time. Will monitor   2. Glaucoma of both eyes, unspecified glaucoma type Appt with consultant is in September and patient out of meds. Will refill eye drops x once  - timolol  (TIMOPTIC ) 0.5 % ophthalmic solution; Place 1 drop into both eyes every morning.  Dispense: 15 mL; Refill: 0   Return if symptoms worsen or fail to improve.   Tanda Raguel SQUIBB, MD

## 2024-05-17 NOTE — Telephone Encounter (Signed)
 FYI Only or Action Required?: FYI only for provider.  Patient was last seen in primary care on 03/08/2024 by Lorren Greig PARAS, NP.  Called Nurse Triage reporting Hypertension.  Symptoms began has been high at dialysis recently.  Interventions attempted: Nothing.  Symptoms are: gradually worsening.  Triage Disposition: See Physician Within 24 Hours  Patient/caregiver understands and will follow disposition?: Yes       Copied from CRM #[id number]. Topic: Clinical - Medical Advice >> May 17, 2024 10:07 AM Leonette SQUIBB wrote: Reason for CRM: pt called sayng he takes dyalysis and he says his blood pressure is rising.  He did not want to make an appt but is asking foe blood pressure medication.  He is also wanting Timolol  and Travatan  for his eyes.  He said he has an appt at [facility] Sept 11th but is asking for a refill although he said you told him you would not refill it again.  He thinks the Creon  is causing him some abd pain  CB [phone]       Reason for Disposition  Systolic BP >= 180 OR Diastolic >= 110  Answer Assessment - Initial Assessment Questions 1. BLOOD PRESSURE: What is your blood pressure? Did you take at least two measurements 5 minutes apart?     190 systolic while at dialysis  2. ONSET: When did you take your blood pressure?     Today at dialysis  3. HOW: How did you take your blood pressure? (e.g., automatic home BP monitor, visiting nurse)     Nurse took it at dialysis  4. HISTORY: Do you have a history of high blood pressure?     Yes 5. MEDICINES: Are you taking any medicines for blood pressure? Have you missed any doses recently?     Was taken off of medication about a year ago  6. OTHER SYMPTOMS: Do you have any symptoms? (e.g., blurred vision, chest pain, difficulty breathing, headache, weakness)     Mild right sided abdominal/side pain x4 days  Protocols used: Blood Pressure - High-A-AH

## 2024-05-18 ENCOUNTER — Encounter: Payer: Self-pay | Admitting: Family Medicine

## 2024-05-19 DIAGNOSIS — N2581 Secondary hyperparathyroidism of renal origin: Secondary | ICD-10-CM | POA: Diagnosis not present

## 2024-05-19 DIAGNOSIS — N186 End stage renal disease: Secondary | ICD-10-CM | POA: Diagnosis not present

## 2024-05-19 DIAGNOSIS — Z992 Dependence on renal dialysis: Secondary | ICD-10-CM | POA: Diagnosis not present

## 2024-05-21 DIAGNOSIS — N2581 Secondary hyperparathyroidism of renal origin: Secondary | ICD-10-CM | POA: Diagnosis not present

## 2024-05-21 DIAGNOSIS — N186 End stage renal disease: Secondary | ICD-10-CM | POA: Diagnosis not present

## 2024-05-21 DIAGNOSIS — Z992 Dependence on renal dialysis: Secondary | ICD-10-CM | POA: Diagnosis not present

## 2024-05-24 DIAGNOSIS — F331 Major depressive disorder, recurrent, moderate: Secondary | ICD-10-CM | POA: Diagnosis not present

## 2024-05-24 DIAGNOSIS — F5101 Primary insomnia: Secondary | ICD-10-CM | POA: Diagnosis not present

## 2024-05-24 DIAGNOSIS — Z992 Dependence on renal dialysis: Secondary | ICD-10-CM | POA: Diagnosis not present

## 2024-05-24 DIAGNOSIS — F411 Generalized anxiety disorder: Secondary | ICD-10-CM | POA: Diagnosis not present

## 2024-05-24 DIAGNOSIS — N2581 Secondary hyperparathyroidism of renal origin: Secondary | ICD-10-CM | POA: Diagnosis not present

## 2024-05-24 DIAGNOSIS — N186 End stage renal disease: Secondary | ICD-10-CM | POA: Diagnosis not present

## 2024-05-26 DIAGNOSIS — N186 End stage renal disease: Secondary | ICD-10-CM | POA: Diagnosis not present

## 2024-05-26 DIAGNOSIS — Z992 Dependence on renal dialysis: Secondary | ICD-10-CM | POA: Diagnosis not present

## 2024-05-26 DIAGNOSIS — N2581 Secondary hyperparathyroidism of renal origin: Secondary | ICD-10-CM | POA: Diagnosis not present

## 2024-05-28 DIAGNOSIS — N2581 Secondary hyperparathyroidism of renal origin: Secondary | ICD-10-CM | POA: Diagnosis not present

## 2024-05-28 DIAGNOSIS — Z992 Dependence on renal dialysis: Secondary | ICD-10-CM | POA: Diagnosis not present

## 2024-05-28 DIAGNOSIS — N186 End stage renal disease: Secondary | ICD-10-CM | POA: Diagnosis not present

## 2024-05-31 DIAGNOSIS — Z992 Dependence on renal dialysis: Secondary | ICD-10-CM | POA: Diagnosis not present

## 2024-05-31 DIAGNOSIS — N2581 Secondary hyperparathyroidism of renal origin: Secondary | ICD-10-CM | POA: Diagnosis not present

## 2024-05-31 DIAGNOSIS — N186 End stage renal disease: Secondary | ICD-10-CM | POA: Diagnosis not present

## 2024-06-01 ENCOUNTER — Ambulatory Visit: Admitting: Family

## 2024-06-02 DIAGNOSIS — Z992 Dependence on renal dialysis: Secondary | ICD-10-CM | POA: Diagnosis not present

## 2024-06-02 DIAGNOSIS — N2581 Secondary hyperparathyroidism of renal origin: Secondary | ICD-10-CM | POA: Diagnosis not present

## 2024-06-02 DIAGNOSIS — N186 End stage renal disease: Secondary | ICD-10-CM | POA: Diagnosis not present

## 2024-06-04 DIAGNOSIS — N2581 Secondary hyperparathyroidism of renal origin: Secondary | ICD-10-CM | POA: Diagnosis not present

## 2024-06-04 DIAGNOSIS — Z992 Dependence on renal dialysis: Secondary | ICD-10-CM | POA: Diagnosis not present

## 2024-06-04 DIAGNOSIS — N186 End stage renal disease: Secondary | ICD-10-CM | POA: Diagnosis not present

## 2024-06-06 DIAGNOSIS — N2581 Secondary hyperparathyroidism of renal origin: Secondary | ICD-10-CM | POA: Diagnosis not present

## 2024-06-06 DIAGNOSIS — N186 End stage renal disease: Secondary | ICD-10-CM | POA: Diagnosis not present

## 2024-06-06 DIAGNOSIS — Z992 Dependence on renal dialysis: Secondary | ICD-10-CM | POA: Diagnosis not present

## 2024-06-07 DIAGNOSIS — N186 End stage renal disease: Secondary | ICD-10-CM | POA: Diagnosis not present

## 2024-06-07 DIAGNOSIS — Z992 Dependence on renal dialysis: Secondary | ICD-10-CM | POA: Diagnosis not present

## 2024-06-07 DIAGNOSIS — N2581 Secondary hyperparathyroidism of renal origin: Secondary | ICD-10-CM | POA: Diagnosis not present

## 2024-06-09 DIAGNOSIS — N2581 Secondary hyperparathyroidism of renal origin: Secondary | ICD-10-CM | POA: Diagnosis not present

## 2024-06-09 DIAGNOSIS — N186 End stage renal disease: Secondary | ICD-10-CM | POA: Diagnosis not present

## 2024-06-09 DIAGNOSIS — Z992 Dependence on renal dialysis: Secondary | ICD-10-CM | POA: Diagnosis not present

## 2024-06-11 DIAGNOSIS — N2581 Secondary hyperparathyroidism of renal origin: Secondary | ICD-10-CM | POA: Diagnosis not present

## 2024-06-11 DIAGNOSIS — N186 End stage renal disease: Secondary | ICD-10-CM | POA: Diagnosis not present

## 2024-06-11 DIAGNOSIS — Z992 Dependence on renal dialysis: Secondary | ICD-10-CM | POA: Diagnosis not present

## 2024-06-12 DIAGNOSIS — N186 End stage renal disease: Secondary | ICD-10-CM | POA: Diagnosis not present

## 2024-06-12 DIAGNOSIS — N179 Acute kidney failure, unspecified: Secondary | ICD-10-CM | POA: Diagnosis not present

## 2024-06-12 DIAGNOSIS — Z992 Dependence on renal dialysis: Secondary | ICD-10-CM | POA: Diagnosis not present

## 2024-06-14 DIAGNOSIS — N186 End stage renal disease: Secondary | ICD-10-CM | POA: Diagnosis not present

## 2024-06-14 DIAGNOSIS — Z992 Dependence on renal dialysis: Secondary | ICD-10-CM | POA: Diagnosis not present

## 2024-06-14 DIAGNOSIS — N2581 Secondary hyperparathyroidism of renal origin: Secondary | ICD-10-CM | POA: Diagnosis not present

## 2024-06-16 DIAGNOSIS — N2581 Secondary hyperparathyroidism of renal origin: Secondary | ICD-10-CM | POA: Diagnosis not present

## 2024-06-16 DIAGNOSIS — N186 End stage renal disease: Secondary | ICD-10-CM | POA: Diagnosis not present

## 2024-06-16 DIAGNOSIS — Z992 Dependence on renal dialysis: Secondary | ICD-10-CM | POA: Diagnosis not present

## 2024-06-17 NOTE — Telephone Encounter (Signed)
 VM received from pt. TC to pt to explain NAC status due to  Co morbidities (sepsis and pancreatic insufficiency) and risk of infection due to chronic foley catheter . Explained  immunosuppression effect on increased infections post txp and pt has a foley catheter. Pt asked questions about living longer with a txp versus dialysis as pt stated his nephrologist told him this. This Clinical research associate stated that pt's nephrologist can explain that much better than this Clinical research associate. Explained that most pts do live longer with a txp as dialysis can be hard on a pt's body but that txp can also cause problems such a infections and cancers. Pt informed that they may be referred to another transplant center by their nephrologist and verbalized understanding of the above. Pt verbalized understanding and thanked this Clinical research associate for talking with him.

## 2024-06-18 DIAGNOSIS — Z992 Dependence on renal dialysis: Secondary | ICD-10-CM | POA: Diagnosis not present

## 2024-06-18 DIAGNOSIS — N2581 Secondary hyperparathyroidism of renal origin: Secondary | ICD-10-CM | POA: Diagnosis not present

## 2024-06-18 DIAGNOSIS — N186 End stage renal disease: Secondary | ICD-10-CM | POA: Diagnosis not present

## 2024-06-21 DIAGNOSIS — Z992 Dependence on renal dialysis: Secondary | ICD-10-CM | POA: Diagnosis not present

## 2024-06-21 DIAGNOSIS — N186 End stage renal disease: Secondary | ICD-10-CM | POA: Diagnosis not present

## 2024-06-21 DIAGNOSIS — N2581 Secondary hyperparathyroidism of renal origin: Secondary | ICD-10-CM | POA: Diagnosis not present

## 2024-06-23 DIAGNOSIS — N2581 Secondary hyperparathyroidism of renal origin: Secondary | ICD-10-CM | POA: Diagnosis not present

## 2024-06-23 DIAGNOSIS — Z992 Dependence on renal dialysis: Secondary | ICD-10-CM | POA: Diagnosis not present

## 2024-06-23 DIAGNOSIS — N186 End stage renal disease: Secondary | ICD-10-CM | POA: Diagnosis not present

## 2024-06-24 ENCOUNTER — Telehealth: Payer: Self-pay | Admitting: Family

## 2024-06-24 NOTE — Telephone Encounter (Signed)
 Copied from CRM [id number]. Topic: Clinical - Prescription Issue >> Jun 24, 2024 11:15 AM Tonda B wrote: Reason for CRM: patient needs a rx for covid he would like [facility] sent to [facility] [address] [place], [place] [address] Phone: [phone] please call patient if there are any questions [phone] (H)

## 2024-06-25 DIAGNOSIS — N2581 Secondary hyperparathyroidism of renal origin: Secondary | ICD-10-CM | POA: Diagnosis not present

## 2024-06-25 DIAGNOSIS — Z992 Dependence on renal dialysis: Secondary | ICD-10-CM | POA: Diagnosis not present

## 2024-06-25 DIAGNOSIS — N186 End stage renal disease: Secondary | ICD-10-CM | POA: Diagnosis not present

## 2024-06-27 ENCOUNTER — Telehealth: Payer: Self-pay | Admitting: *Deleted

## 2024-06-27 NOTE — Telephone Encounter (Signed)
 I have attempted without success to contact this patient by phone to return their call and I left a message on answering machine.

## 2024-06-27 NOTE — Telephone Encounter (Signed)
 Dr. Tanda if you will please advise. I am unfamiliar with prescribing a Covid vaccination. Thank you.

## 2024-06-28 ENCOUNTER — Telehealth: Payer: Self-pay

## 2024-06-28 DIAGNOSIS — Z992 Dependence on renal dialysis: Secondary | ICD-10-CM | POA: Diagnosis not present

## 2024-06-28 DIAGNOSIS — N186 End stage renal disease: Secondary | ICD-10-CM | POA: Diagnosis not present

## 2024-06-28 DIAGNOSIS — N2581 Secondary hyperparathyroidism of renal origin: Secondary | ICD-10-CM | POA: Diagnosis not present

## 2024-06-28 NOTE — Telephone Encounter (Signed)
 Copied from CRM [id number]. Topic: General - Other >> Jun 28, 2024  8:28 AM Emylou G wrote: Reason for CRM: Patient called checking status of covid script. Pls call patient

## 2024-06-29 ENCOUNTER — Telehealth (INDEPENDENT_AMBULATORY_CARE_PROVIDER_SITE_OTHER): Admitting: Family

## 2024-06-29 DIAGNOSIS — F419 Anxiety disorder, unspecified: Secondary | ICD-10-CM | POA: Diagnosis not present

## 2024-06-29 DIAGNOSIS — F32A Depression, unspecified: Secondary | ICD-10-CM

## 2024-06-29 MED ORDER — SERTRALINE HCL 100 MG PO TABS: 100.0000 mg | ORAL_TABLET | Freq: Two times a day (BID) | ORAL | 0 refills | Status: DC

## 2024-06-29 NOTE — Progress Notes (Signed)
 Virtual Visit via Video Note  I connected with Manuel Ortega, on 06/29/2024 at 9:27 AM by video and verified that I am speaking with the correct person using two identifiers.  Consent: I discussed the limitations, risks, security and privacy concerns of performing an evaluation and management service by video and the availability of in person appointments. I also discussed with the patient that there may be a patient responsible charge related to this service. The patient expressed understanding and agreed to proceed.   Location of Patient: Home  Location of Provider: [facility] at [facility]   Persons participating in Telemedicine visit: Manuel Ortega Greig Lorren, NP Purvis Pepper, CMA   History of Present Illness: Manuel Ortega is a 73 y.o. male who presents for anxiety depression follow-up. He is doing well on Sertraline , no issues/concerns. He is established with Psychiatry and states when they refill Sertraline  for him they only refill for 30 days. He denies thoughts of self-harm, suicidal ideations, homicidal ideations. States he is scheduled for a brain scan and lung specialist follow-up appointments soon.     Past Medical History:  Diagnosis Date   AKI (acute kidney injury) (HCC) 05/2020   possibly due to prostate blockage   Anxiety    Arthritis    Depression    Dyspnea    Glaucoma    Hypertension    Prostate cancer [facility])    Renal disorder    Dialysis T/th/sat   No Known Allergies  Current Outpatient Medications on File Prior to Visit  Medication Sig Dispense Refill   calcitRIOL  (ROCALTROL ) 0.25 MCG capsule Take 5 capsules (1.25 mcg total) by mouth Every Tuesday,Thursday,and Saturday with dialysis. 30 capsule 0   cinacalcet  (SENSIPAR ) 30 MG tablet Take 1 tablet (30 mg total) by mouth Every Tuesday,Thursday,and Saturday with dialysis. 60 tablet 0   CREON  36000-114000 units CPEP capsule Take 36,000-72,000 Units by mouth See admin  instructions. Take 72,000 units by mouth three time a day with meals and 36,000 units three times a day with snacks     diphenhydrAMINE  (BENADRYL ) 25 mg capsule Take 25 mg by mouth every 4 (four) hours as needed for itching or allergies.     ferrous sulfate  325 (65 FE) MG EC tablet Take 325 mg by mouth daily with breakfast.     LUMIGAN  0.01 % SOLN Place 1 drop into both eyes at bedtime. 7.5 mL 0   Methoxy PEG-Epoetin Beta (MIRCERA IJ) 30 mcg.     mirtazapine  (REMERON ) 15 MG tablet Take 15 mg by mouth daily.     montelukast  (SINGULAIR ) 10 MG tablet Take 10 mg by mouth daily.     pantoprazole  (PROTONIX ) 40 MG tablet Take 1 tablet (40 mg total) by mouth 2 (two) times daily. 60 tablet 1   QUEtiapine  Fumarate (SEROQUEL  XR) 150 MG 24 hr tablet Take 300 mg by mouth at bedtime.     sertraline  (ZOLOFT ) 100 MG tablet Take 100 mg by mouth in the morning and at bedtime.     sevelamer  carbonate (RENVELA ) 800 MG tablet Take 1,600 mg by mouth 3 (three) times daily.     sildenafil (VIAGRA) 100 MG tablet Take 100 mg by mouth as needed for erectile dysfunction.     timolol  (TIMOPTIC ) 0.5 % ophthalmic solution Place 1 drop into both eyes every morning. 15 mL 0   Travoprost , BAK Free, (TRAVATAN ) 0.004 % SOLN ophthalmic solution Place 1 drop into both eyes at bedtime. 15 mL 0   TYLENOL  500 MG  tablet Take 500-1,000 mg by mouth every 6 (six) hours as needed for mild pain (pain score 1-3) or headache.     No current facility-administered medications on file prior to visit.    Observations/Objective: Alert and oriented x 3. Not in acute distress. Physical examination not completed as this is a telemedicine visit.  Assessment and Plan: 1. Anxiety and depression (Primary) - Patient denies thoughts of self-harm, suicidal ideations, homicidal ideations. - Continue Sertraline  as prescribed. Counseled on medication adherence/adverse effects.  - Keep all scheduled appointments with established Psychiatry.  - Follow-up  with primary provider as scheduled. - sertraline  (ZOLOFT ) 100 MG tablet; Take 1 tablet (100 mg total) by mouth in the morning and at bedtime.  Dispense: 180 tablet; Refill: 0   Follow Up Instructions: Follow-up with primary provider as scheduled.   Patient was given clear instructions to go to Emergency Department or return to medical center if symptoms don't improve, worsen, or new problems develop.The patient verbalized understanding.  I discussed the assessment and treatment plan with the patient. The patient was provided an opportunity to ask questions and all were answered. The patient agreed with the plan and demonstrated an understanding of the instructions.   The patient was advised to call back or seek an in-person evaluation if the symptoms worsen or if the condition fails to improve as anticipated.   I provided 8 minutes total of non-face-to-face time during this encounter.   Greig JINNY Drones, NP  [facility] [place], [place] [phone] 06/29/2024, 9:27 AM

## 2024-06-30 ENCOUNTER — Other Ambulatory Visit: Payer: Self-pay | Admitting: Family

## 2024-06-30 DIAGNOSIS — Z23 Encounter for immunization: Secondary | ICD-10-CM

## 2024-06-30 DIAGNOSIS — N2581 Secondary hyperparathyroidism of renal origin: Secondary | ICD-10-CM | POA: Diagnosis not present

## 2024-06-30 DIAGNOSIS — N186 End stage renal disease: Secondary | ICD-10-CM | POA: Diagnosis not present

## 2024-06-30 DIAGNOSIS — Z992 Dependence on renal dialysis: Secondary | ICD-10-CM | POA: Diagnosis not present

## 2024-06-30 MED ORDER — COVID-19 MRNA VAC-TRIS(PFIZER) 30 MCG/0.3ML IM SUSY: 0.3000 mL | PREFILLED_SYRINGE | Freq: Once | INTRAMUSCULAR | 0 refills | Status: AC

## 2024-06-30 NOTE — Telephone Encounter (Signed)
 Thank you Dr. Newlin. [place] Covid vaccination prescribed.

## 2024-06-30 NOTE — Telephone Encounter (Signed)
 Dr. Newlin if you will please advise. Patient requests prescription for Covid vaccination. I am unfamiliar with prescribing a Covid vaccination. I consulted with Dr. Tanda who instructed me to ask you for advisement. Thank you.

## 2024-06-30 NOTE — Telephone Encounter (Signed)
 Dr. Juli had sent an email 06/23/2024 with details about placing this order.  In the Add order section you select COVID-vaccine and then pick up the type of vaccine in the order set.  If any indication pops up, select the applicable indication like in this case his age above 84 but you can typically send it to his pharmacy without even checking that box.  Hope this helps.

## 2024-07-01 NOTE — Telephone Encounter (Signed)
 I called patient to let him know that they prescribed his covid vaccine however he stated  he already got it about a week ago

## 2024-07-02 DIAGNOSIS — N186 End stage renal disease: Secondary | ICD-10-CM | POA: Diagnosis not present

## 2024-07-02 DIAGNOSIS — Z992 Dependence on renal dialysis: Secondary | ICD-10-CM | POA: Diagnosis not present

## 2024-07-02 DIAGNOSIS — N2581 Secondary hyperparathyroidism of renal origin: Secondary | ICD-10-CM | POA: Diagnosis not present

## 2024-07-05 DIAGNOSIS — N186 End stage renal disease: Secondary | ICD-10-CM | POA: Diagnosis not present

## 2024-07-05 DIAGNOSIS — Z992 Dependence on renal dialysis: Secondary | ICD-10-CM | POA: Diagnosis not present

## 2024-07-05 DIAGNOSIS — N2581 Secondary hyperparathyroidism of renal origin: Secondary | ICD-10-CM | POA: Diagnosis not present

## 2024-07-07 DIAGNOSIS — N186 End stage renal disease: Secondary | ICD-10-CM | POA: Diagnosis not present

## 2024-07-07 DIAGNOSIS — N2581 Secondary hyperparathyroidism of renal origin: Secondary | ICD-10-CM | POA: Diagnosis not present

## 2024-07-07 DIAGNOSIS — Z992 Dependence on renal dialysis: Secondary | ICD-10-CM | POA: Diagnosis not present

## 2024-07-09 DIAGNOSIS — Z992 Dependence on renal dialysis: Secondary | ICD-10-CM | POA: Diagnosis not present

## 2024-07-09 DIAGNOSIS — N186 End stage renal disease: Secondary | ICD-10-CM | POA: Diagnosis not present

## 2024-07-09 DIAGNOSIS — N2581 Secondary hyperparathyroidism of renal origin: Secondary | ICD-10-CM | POA: Diagnosis not present

## 2024-07-12 DIAGNOSIS — N2581 Secondary hyperparathyroidism of renal origin: Secondary | ICD-10-CM | POA: Diagnosis not present

## 2024-07-12 DIAGNOSIS — Z992 Dependence on renal dialysis: Secondary | ICD-10-CM | POA: Diagnosis not present

## 2024-07-12 DIAGNOSIS — N179 Acute kidney failure, unspecified: Secondary | ICD-10-CM | POA: Diagnosis not present

## 2024-07-12 DIAGNOSIS — N186 End stage renal disease: Secondary | ICD-10-CM | POA: Diagnosis not present

## 2024-07-13 ENCOUNTER — Ambulatory Visit: Admitting: Acute Care

## 2024-07-14 DIAGNOSIS — Z992 Dependence on renal dialysis: Secondary | ICD-10-CM | POA: Diagnosis not present

## 2024-07-14 DIAGNOSIS — N186 End stage renal disease: Secondary | ICD-10-CM | POA: Diagnosis not present

## 2024-07-14 DIAGNOSIS — N2581 Secondary hyperparathyroidism of renal origin: Secondary | ICD-10-CM | POA: Diagnosis not present

## 2024-07-15 ENCOUNTER — Telehealth: Payer: Self-pay

## 2024-07-15 NOTE — Telephone Encounter (Signed)
 Can we call and get this man scheduled for his Ct scan within next week please

## 2024-07-15 NOTE — Telephone Encounter (Signed)
 Patient is aware of CT scan and follow up

## 2024-07-18 ENCOUNTER — Ambulatory Visit: Admitting: Acute Care

## 2024-07-19 DIAGNOSIS — Z992 Dependence on renal dialysis: Secondary | ICD-10-CM | POA: Diagnosis not present

## 2024-07-19 DIAGNOSIS — N2581 Secondary hyperparathyroidism of renal origin: Secondary | ICD-10-CM | POA: Diagnosis not present

## 2024-07-20 ENCOUNTER — Encounter: Payer: Self-pay | Admitting: Neurology

## 2024-07-20 ENCOUNTER — Ambulatory Visit: Admitting: Neurology

## 2024-07-21 DIAGNOSIS — R509 Fever, unspecified: Secondary | ICD-10-CM | POA: Diagnosis not present

## 2024-07-21 DIAGNOSIS — R5383 Other fatigue: Secondary | ICD-10-CM | POA: Diagnosis not present

## 2024-07-21 DIAGNOSIS — N186 End stage renal disease: Secondary | ICD-10-CM | POA: Diagnosis not present

## 2024-07-21 DIAGNOSIS — F32A Depression, unspecified: Secondary | ICD-10-CM | POA: Diagnosis not present

## 2024-07-21 DIAGNOSIS — R0602 Shortness of breath: Secondary | ICD-10-CM | POA: Diagnosis not present

## 2024-07-23 DIAGNOSIS — Z992 Dependence on renal dialysis: Secondary | ICD-10-CM | POA: Diagnosis not present

## 2024-07-23 DIAGNOSIS — N186 End stage renal disease: Secondary | ICD-10-CM | POA: Diagnosis not present

## 2024-07-23 DIAGNOSIS — N2581 Secondary hyperparathyroidism of renal origin: Secondary | ICD-10-CM | POA: Diagnosis not present

## 2024-07-26 ENCOUNTER — Telehealth: Payer: Self-pay | Admitting: *Deleted

## 2024-07-26 NOTE — Telephone Encounter (Signed)
 Patient called and asked about his medication. Patient said his medication has already came and no other concerns about that. Patient said he was recently seen in the ambulance about having SOB . However, he did not go to the hospital but  would like his provider to text him  what the next step should be. I offer patient an appointment to  come in  , he decline and would like a text.I did explain  that providers usually donot text patient but would pass the message on. Patient was ok with that

## 2024-07-27 NOTE — Telephone Encounter (Signed)
 Please note message sent to me on  07/26/24  8:28 AM. I was out of the office beginning 07/14/2024 and returned to the office on 07/27/2024. Schedule appointment. During the interim report to the Emergency Department/Urgent Care/call 911 for immediate medical evaluation.

## 2024-07-28 ENCOUNTER — Encounter (HOSPITAL_COMMUNITY): Payer: Self-pay

## 2024-07-28 ENCOUNTER — Other Ambulatory Visit: Payer: Self-pay

## 2024-07-28 ENCOUNTER — Emergency Department (HOSPITAL_COMMUNITY)

## 2024-07-28 ENCOUNTER — Inpatient Hospital Stay (HOSPITAL_COMMUNITY)
Admission: EM | Admit: 2024-07-28 | Discharge: 2024-08-03 | DRG: 689 | Disposition: A | Discharge status: Home / Self Care | Attending: Internal Medicine | Admitting: Internal Medicine

## 2024-07-28 ENCOUNTER — Ambulatory Visit (HOSPITAL_COMMUNITY)
Admission: RE | Admit: 2024-07-28 | Discharge: 2024-07-28 | Disposition: A | Discharge status: Home / Self Care | Source: Ambulatory Visit | Attending: Acute Care | Admitting: Acute Care

## 2024-07-28 DIAGNOSIS — R918 Other nonspecific abnormal finding of lung field: Secondary | ICD-10-CM

## 2024-07-28 DIAGNOSIS — D631 Anemia in chronic kidney disease: Secondary | ICD-10-CM | POA: Diagnosis present

## 2024-07-28 DIAGNOSIS — Z992 Dependence on renal dialysis: Secondary | ICD-10-CM | POA: Diagnosis not present

## 2024-07-28 DIAGNOSIS — S22080A Wedge compression fracture of T11-T12 vertebra, initial encounter for closed fracture: Secondary | ICD-10-CM | POA: Diagnosis not present

## 2024-07-28 DIAGNOSIS — N3000 Acute cystitis without hematuria: Secondary | ICD-10-CM | POA: Diagnosis not present

## 2024-07-28 DIAGNOSIS — Z8546 Personal history of malignant neoplasm of prostate: Secondary | ICD-10-CM

## 2024-07-28 DIAGNOSIS — R42 Dizziness and giddiness: Secondary | ICD-10-CM | POA: Diagnosis not present

## 2024-07-28 DIAGNOSIS — E860 Dehydration: Secondary | ICD-10-CM | POA: Diagnosis present

## 2024-07-28 DIAGNOSIS — N2581 Secondary hyperparathyroidism of renal origin: Secondary | ICD-10-CM | POA: Diagnosis present

## 2024-07-28 DIAGNOSIS — R509 Fever, unspecified: Secondary | ICD-10-CM | POA: Diagnosis not present

## 2024-07-28 DIAGNOSIS — I120 Hypertensive chronic kidney disease with stage 5 chronic kidney disease or end stage renal disease: Secondary | ICD-10-CM | POA: Diagnosis present

## 2024-07-28 DIAGNOSIS — N390 Urinary tract infection, site not specified: Secondary | ICD-10-CM | POA: Diagnosis present

## 2024-07-28 DIAGNOSIS — R55 Syncope and collapse: Secondary | ICD-10-CM | POA: Diagnosis present

## 2024-07-28 DIAGNOSIS — Z743 Need for continuous supervision: Secondary | ICD-10-CM | POA: Diagnosis not present

## 2024-07-28 DIAGNOSIS — B961 Klebsiella pneumoniae [K. pneumoniae] as the cause of diseases classified elsewhere: Secondary | ICD-10-CM | POA: Diagnosis present

## 2024-07-28 DIAGNOSIS — S22070A Wedge compression fracture of T9-T10 vertebra, initial encounter for closed fracture: Secondary | ICD-10-CM | POA: Diagnosis present

## 2024-07-28 DIAGNOSIS — F32A Depression, unspecified: Secondary | ICD-10-CM | POA: Diagnosis present

## 2024-07-28 DIAGNOSIS — E538 Deficiency of other specified B group vitamins: Secondary | ICD-10-CM | POA: Diagnosis present

## 2024-07-28 DIAGNOSIS — R109 Unspecified abdominal pain: Secondary | ICD-10-CM | POA: Diagnosis present

## 2024-07-28 DIAGNOSIS — Z8711 Personal history of peptic ulcer disease: Secondary | ICD-10-CM | POA: Diagnosis not present

## 2024-07-28 DIAGNOSIS — R0609 Other forms of dyspnea: Secondary | ICD-10-CM | POA: Diagnosis not present

## 2024-07-28 DIAGNOSIS — K861 Other chronic pancreatitis: Secondary | ICD-10-CM | POA: Diagnosis present

## 2024-07-28 DIAGNOSIS — Z823 Family history of stroke: Secondary | ICD-10-CM

## 2024-07-28 DIAGNOSIS — N186 End stage renal disease: Secondary | ICD-10-CM | POA: Diagnosis present

## 2024-07-28 DIAGNOSIS — M4854XA Collapsed vertebra, not elsewhere classified, thoracic region, initial encounter for fracture: Secondary | ICD-10-CM | POA: Diagnosis present

## 2024-07-28 DIAGNOSIS — E663 Overweight: Secondary | ICD-10-CM | POA: Diagnosis present

## 2024-07-28 DIAGNOSIS — H409 Unspecified glaucoma: Secondary | ICD-10-CM | POA: Diagnosis present

## 2024-07-28 DIAGNOSIS — R079 Chest pain, unspecified: Secondary | ICD-10-CM | POA: Diagnosis not present

## 2024-07-28 DIAGNOSIS — R1013 Epigastric pain: Principal | ICD-10-CM

## 2024-07-28 DIAGNOSIS — Z79899 Other long term (current) drug therapy: Secondary | ICD-10-CM

## 2024-07-28 DIAGNOSIS — F419 Anxiety disorder, unspecified: Secondary | ICD-10-CM | POA: Diagnosis present

## 2024-07-28 DIAGNOSIS — R531 Weakness: Secondary | ICD-10-CM | POA: Diagnosis not present

## 2024-07-28 DIAGNOSIS — Z6825 Body mass index (BMI) 25.0-25.9, adult: Secondary | ICD-10-CM

## 2024-07-28 DIAGNOSIS — I700 Atherosclerosis of aorta: Secondary | ICD-10-CM | POA: Diagnosis not present

## 2024-07-28 DIAGNOSIS — R7989 Other specified abnormal findings of blood chemistry: Secondary | ICD-10-CM | POA: Diagnosis present

## 2024-07-28 DIAGNOSIS — R112 Nausea with vomiting, unspecified: Secondary | ICD-10-CM | POA: Diagnosis present

## 2024-07-28 HISTORY — DX: Peptic ulcer, site unspecified, unspecified as acute or chronic, without hemorrhage or perforation: K27.9

## 2024-07-28 HISTORY — DX: End stage renal disease: N18.6

## 2024-07-28 HISTORY — DX: Other chronic pancreatitis: K86.1

## 2024-07-28 LAB — TROPONIN T, HIGH SENSITIVITY
Troponin T High Sensitivity: 62 ng/L — ABNORMAL HIGH (ref 0–19)
Troponin T High Sensitivity: 58 ng/L — ABNORMAL HIGH (ref 0–19)

## 2024-07-28 LAB — CBC WITH DIFFERENTIAL/PLATELET
Abs Immature Granulocytes: 0.07 K/uL (ref 0.00–0.07)
Basophils Absolute: 0.1 K/uL (ref 0.0–0.1)
Basophils Relative: 1 %
Eosinophils Absolute: 0.1 K/uL (ref 0.0–0.5)
Eosinophils Relative: 1 %
HCT: 41.8 % (ref 39.0–52.0)
Hemoglobin: 13.4 g/dL (ref 13.0–17.0)
Immature Granulocytes: 1 %
Lymphocytes Relative: 22 %
Lymphs Abs: 1.9 K/uL (ref 0.7–4.0)
MCH: 29.1 pg (ref 26.0–34.0)
MCHC: 32.1 g/dL (ref 30.0–36.0)
MCV: 90.9 fL (ref 80.0–100.0)
Monocytes Absolute: 0.8 K/uL (ref 0.1–1.0)
Monocytes Relative: 10 %
Neutro Abs: 5.5 K/uL (ref 1.7–7.7)
Neutrophils Relative %: 65 %
Platelets: 226 K/uL (ref 150–400)
RBC: 4.6 MIL/uL (ref 4.22–5.81)
RDW: 14.8 % (ref 11.5–15.5)
WBC: 8.5 K/uL (ref 4.0–10.5)
nRBC: 0 % (ref 0.0–0.2)

## 2024-07-28 LAB — COMPREHENSIVE METABOLIC PANEL WITH GFR
ALT: 7 U/L (ref 0–44)
AST: 18 U/L (ref 15–41)
Albumin: 4.6 g/dL (ref 3.5–5.0)
Alkaline Phosphatase: 87 U/L (ref 38–126)
Anion gap: 19 — ABNORMAL HIGH (ref 5–15)
BUN: 21 mg/dL (ref 8–23)
CO2: 26 mmol/L (ref 22–32)
Calcium: 10.4 mg/dL — ABNORMAL HIGH (ref 8.9–10.3)
Chloride: 93 mmol/L — ABNORMAL LOW (ref 98–111)
Creatinine, Ser: 5.07 mg/dL — ABNORMAL HIGH (ref 0.61–1.24)
GFR, Estimated: 11 mL/min — ABNORMAL LOW (ref >60)
Glucose, Bld: 106 mg/dL — ABNORMAL HIGH (ref 70–99)
Potassium: 3.8 mmol/L (ref 3.5–5.1)
Sodium: 138 mmol/L (ref 135–145)
Total Bilirubin: 1 mg/dL (ref 0.0–1.2)
Total Protein: 7.9 g/dL (ref 6.5–8.1)

## 2024-07-28 LAB — GLUCOSE, CAPILLARY: Glucose-Capillary: 107 mg/dL — ABNORMAL HIGH (ref 70–99)

## 2024-07-28 LAB — LIPASE, BLOOD: Lipase: 79 U/L — ABNORMAL HIGH (ref 11–51)

## 2024-07-28 MED ORDER — ALUM & MAG HYDROXIDE-SIMETH 200-200-20 MG/5ML PO SUSP
30.0000 mL | Freq: Once | ORAL | Status: AC
  Administered 2024-07-28: 30 mL via ORAL
  Filled 2024-07-28: qty 30

## 2024-07-28 MED ORDER — LIDOCAINE VISCOUS HCL 2 % MT SOLN
15.0000 mL | Freq: Once | OROMUCOSAL | Status: AC
  Administered 2024-07-28: 15 mL via ORAL
  Filled 2024-07-28: qty 15

## 2024-07-28 MED ORDER — HYDROMORPHONE HCL 1 MG/ML IJ SOLN
0.5000 mg | Freq: Once | INTRAMUSCULAR | Status: AC
  Administered 2024-07-28: 0.5 mg via INTRAVENOUS
  Filled 2024-07-28: qty 1

## 2024-07-28 MED ORDER — LACTATED RINGERS IV SOLN: INTRAVENOUS | Status: DC

## 2024-07-28 MED ORDER — HYDROMORPHONE HCL 1 MG/ML IJ SOLN
0.5000 mg | Freq: Once | INTRAMUSCULAR | Status: DC
  Filled 2024-07-28: qty 1

## 2024-07-28 MED ORDER — PANTOPRAZOLE SODIUM 40 MG IV SOLR
40.0000 mg | Freq: Once | INTRAVENOUS | Status: AC
  Administered 2024-07-28: 40 mg via INTRAVENOUS
  Filled 2024-07-28: qty 10

## 2024-07-28 MED ORDER — FENTANYL CITRATE (PF) 50 MCG/ML IJ SOSY
50.0000 ug | PREFILLED_SYRINGE | Freq: Once | INTRAMUSCULAR | Status: AC
  Administered 2024-07-28: 50 ug via INTRAVENOUS
  Filled 2024-07-28: qty 1

## 2024-07-28 MED ORDER — LACTATED RINGERS IV BOLUS
1000.0000 mL | Freq: Once | INTRAVENOUS | Status: AC
  Administered 2024-07-28: 1000 mL via INTRAVENOUS

## 2024-07-28 NOTE — Progress Notes (Signed)
 Orthopedic Tech Progress Note Patient Details:  Manuel Ortega 13-Sep-1951 [id number]  Patient ID: [name], male   DOB: [birth year], 73 y.o.   MRN: [id number]  Waylan Thom Loving 07/28/2024, 8:44 PM TLSO not applied  waiting to see if admitted. Pt stated he was in no pain and to wait.

## 2024-07-28 NOTE — ED Notes (Signed)
 Patient ambulatory to bathroom without assistance - even and steady gait. Patient stated ' excuse me' and this RN stated 'yes, sir?' Patient immediately waved his hand at RN, said, 'nothing' and walked into bathroom.

## 2024-07-28 NOTE — ED Triage Notes (Signed)
 Pt was driving to [place] for an outpatient procedure after dialysis today. Started feeling dizzy, SOB. Per rapid response nurse when assessing pt BP 105/62, 5 minutes later 93/62 CBG 107 .

## 2024-07-28 NOTE — ED Notes (Signed)
 Patient called out to secretary that he was cold and Diplomatic Services operational officer took him a blanket, which he refused. JRPRN

## 2024-07-28 NOTE — ED Notes (Signed)
 Patient ambulated out to the nurses station to state he was cold. Nurse offered him a blanket and he states it will only be warm for 15 minutes. He asked that nurse or someone on staff driving him to his car across parking lot to get his coat. This nurse informed him that the staff was not allowed to leave ED or drive patient across the parking lot. Informed member that he could have a blanket or multiple blankets to wrap up in if he would like. He states he is a dialysis patient and stays cold and he needs his jacket. This nurse again offered a blanket and he states well you plan to give me a warm blanket every 15 minutes then and walks off. JRPRN

## 2024-07-28 NOTE — ED Provider Notes (Signed)
 [place] EMERGENCY DEPARTMENT AT [facility] Provider Note   CSN: [id number] Arrival date & time: 07/28/24  1434     History  Chief Complaint  Patient presents with   Dizziness    Manuel Ortega is a 73 y.o. 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  History of ESRD on HD, last treatment was this morning got a full session with no complaints. He reports several days of upper abdominal pain with nausea and vomiting.  He has a history of a bleeding duodenal ulcer several months ago noted on endoscopy for which she was treated with antacids but he states that he thought that those antacids were on an as-needed basis and so has not been taking them every day.  He also has a history of pancreatitis and takes Creon  but thinks it recently has not been working.  He was found in the ER lying on the floor with vomit next to him.  He states that he did feel dizzy but did not fall or lose consciousness and instead lowered himself to the ground because he thought he may be able to vomit there better.  He also states that when he has the pain he has a hard time catching his breath. endorses 7 out of 10 pain at this time.  States that now that he has vomited his nausea has improved.  He denies any hematochezia or melena or any bleeding at this time but states that he did have to receive blood transfusions as a complication of the bleeding ulcer.   Past Medical History:  Diagnosis Date   AKI (acute kidney injury) 05/2020   possibly due to prostate blockage   Anxiety    Arthritis    Depression    Dyspnea    Glaucoma    Hypertension    Prostate cancer [facility])    Renal disorder    Dialysis T/th/sat       Home Medications Prior to Admission medications   Medication Sig Start Date End Date Taking? Authorizing Provider  calcitRIOL  (ROCALTROL )  0.25 MCG capsule Take 5 capsules (1.25 mcg total) by mouth Every Tuesday,Thursday,and Saturday with dialysis. 02/11/24   Elgergawy, Brayton RAMAN, MD  cinacalcet  (SENSIPAR ) 30 MG tablet Take 1 tablet (30 mg total) by mouth Every Tuesday,Thursday,and Saturday with dialysis. 02/11/24   Elgergawy, Brayton RAMAN, MD  CREON  36000-114000 units CPEP capsule Take 36,000-72,000 Units by mouth See admin instructions. Take 72,000 units by mouth three time a day with meals and 36,000 units three times a day with snacks    [provider]  diphenhydrAMINE  (BENADRYL ) 25 mg capsule Take 25 mg by mouth every 4 (four) hours as needed for itching or allergies.    [provider]  ferrous sulfate  325 (65 FE) MG EC tablet Take 325 mg by mouth daily with breakfast.    [provider]  LUMIGAN  0.01 % SOLN Place 1 drop into both eyes at bedtime. 03/08/24   Lorren Greig PARAS, NP  Methoxy PEG-Epoetin Beta (MIRCERA IJ) 30 mcg. 02/16/24 02/14/25  [provider]  mirtazapine  (REMERON ) 15 MG tablet Take 15 mg by mouth daily. 11/22/23   [provider]  montelukast  (SINGULAIR ) 10 MG tablet Take 10 mg by mouth daily. 12/18/23   [provider]  pantoprazole  (PROTONIX ) 40 MG tablet Take 1 tablet (40 mg total) by mouth 2 (two) times daily. 10/15/23  Odell Celinda Balo, MD  QUEtiapine  Fumarate (SEROQUEL  XR) 150 MG 24 hr tablet Take 300 mg by mouth at bedtime. 11/27/23   [provider]  sertraline  (ZOLOFT ) 100 MG tablet Take 1 tablet (100 mg total) by mouth in the morning and at bedtime. 06/29/24 09/27/24  Lorren Greig PARAS, NP  sevelamer  carbonate (RENVELA ) 800 MG tablet Take 1,600 mg by mouth 3 (three) times daily. 02/23/24   [provider]  sildenafil (VIAGRA) 100 MG tablet Take 100 mg by mouth as needed for erectile dysfunction. 12/17/23   [provider]  timolol  (TIMOPTIC ) 0.5 % ophthalmic solution Place 1 drop into both eyes every morning. 05/17/24   Tanda Bleacher, MD   Travoprost , BAK Free, (TRAVATAN ) 0.004 % SOLN ophthalmic solution Place 1 drop into both eyes at bedtime. 05/17/24   Tanda Bleacher, MD  TYLENOL  500 MG tablet Take 500-1,000 mg by mouth every 6 (six) hours as needed for mild pain (pain score 1-3) or headache.    [provider]      Allergies    Patient has no known allergies.    Review of Systems   Review of Systems A 10 point review of systems was performed and is negative unless otherwise reported in HPI.  Physical Exam Updated Vital Signs BP 120/76   Pulse 92   Temp 98 F (36.7 C)   Resp 16   Ht 6' 1 (1.854 m)   Wt 88.5 kg   SpO2 100%   BMI 25.73 kg/m  Physical Exam General: Uncomfortable appearing male, lying in bed.  HEENT: PERRLA, Sclera anicteric, MMM, trachea midline.  Cardiology: RRR, no murmurs/rubs/gallops.  Resp: Normal respiratory rate and effort. CTAB, no wheezes, rhonchi, crackles.  Abd: Soft, tenderness outpatient in the epigastric and left upper quadrant region.  Non-distended. No rebound tenderness or guarding.  GU: Deferred. MSK: No peripheral edema or signs of trauma. Extremities without deformity or TTP. No cyanosis or clubbing. Skin: warm, dry.  Back: No CVA tenderness.  Mild tenderness outpatient in the lower thoracic midline without any step-offs or deformities. Neuro: A&Ox4, CNs II-XII grossly intact. MAEs. Sensation grossly intact.  Psych: Normal mood and affect.   ED Results / Procedures / Treatments   Labs (all labs ordered are listed, but only abnormal results are displayed) Labs Reviewed  COMPREHENSIVE METABOLIC PANEL WITH GFR - Abnormal; Notable for the following components:      Result Value   Chloride 93 (*)    Glucose, Bld 106 (*)    Creatinine, Ser 5.07 (*)    Calcium  10.4 (*)    GFR, Estimated 11 (*)    Anion gap 19 (*)    All other components within normal limits  LIPASE, BLOOD - Abnormal; Notable for the following components:   Lipase 79 (*)    All other components  within normal limits  CBC WITH DIFFERENTIAL/PLATELET - Abnormal; Notable for the following components:   RBC 4.11 (*)    Hemoglobin 12.4 (*)    HCT 38.0 (*)    All other components within normal limits  PRO BRAIN NATRIURETIC PEPTIDE - Abnormal; Notable for the following components:   Pro Brain Natriuretic Peptide 992.0 (*)    All other components within normal limits  TROPONIN T, HIGH SENSITIVITY - Abnormal; Notable for the following components:   Troponin T High Sensitivity 62 (*)    All other components within normal limits  TROPONIN T, HIGH SENSITIVITY - Abnormal; Notable for the following components:   Troponin T  High Sensitivity 58 (*)    All other components within normal limits  CBC WITH DIFFERENTIAL/PLATELET  MAGNESIUM   PHOSPHORUS  HEPATITIS B SURFACE ANTIGEN  HEPATITIS B SURFACE ANTIBODY, QUANTITATIVE    EKG EKG Interpretation Date/Time:  Thursday July 28 2024 16:10:22 EDT Ventricular Rate:  93 PR Interval:  192 QRS Duration:  106 QT Interval:  392 QTC Calculation: 488 R Axis:   -50  Text Interpretation: Sinus rhythm LAD, consider left anterior fascicular block Borderline prolonged QT interval Confirmed by Franklyn Gills [phone]) on 07/28/2024 4:12:00 PM  Radiology CT chest abd pelvis wo contrast: 1. Mild to moderate compression fractures at T10 and T12, as described above. There are new from prior and possibly acute. 2. Otherwise, not acute findings in the chest, abdomen, or pelvis. 3. Additional stable ancillary findings as above.  Procedures Procedures    Medications Ordered in ED Medications  pantoprazole  (PROTONIX ) injection 40 mg (40 mg Intravenous Given 07/28/24 1633)  fentaNYL  (SUBLIMAZE ) injection 50 mcg (50 mcg Intravenous Given 07/28/24 1704)  alum & mag hydroxide-simeth (MAALOX/MYLANTA) 200-200-20 MG/5ML suspension 30 mL (30 mLs Oral Given 07/28/24 2030)    And  lidocaine  (XYLOCAINE ) 2 % viscous mouth solution 15 mL (15 mLs Oral Given 07/28/24  2040)  lactated ringers  bolus 1,000 mL (0 mLs Intravenous Stopped 07/29/24 0004)  HYDROmorphone (DILAUDID) injection 0.5 mg (0.5 mg Intravenous Given 07/28/24 2324)    ED Course/ Medical Decision Making/ A&P                          Medical Decision Making Amount and/or Complexity of Data Reviewed Labs: ordered. Decision-making details documented in ED Course. Radiology: ordered. Decision-making details documented in ED Course.  Risk OTC drugs. Prescription drug management. Decision regarding hospitalization.    This patient presents to the ED for concern of abdominal pain nausea vomiting dizziness, this involves an extensive number of treatment options, and is a complaint that carries with it a high risk of complications and morbidity.  I considered the following differential and admission for this acute, potentially life threatening condition.   MDM:    Patient with a history of peptic ulcer disease and gastritis not compliant with his antacids due to misunderstanding of dosing.  Also with a history of pancreatitis.  Presents with epigastric abdominal pain and nausea and vomiting and dizziness.  Consider most likely gastritis/PUD or pancreatitis and will obtain imaging and labs.  Will give IV Dilaudid as well as IV Protonix .  Patient does have a history of prolonged QT syndrome, EKG w/ borderline prolonged QTc 488 ms.  Also consider other causes including acute hepatobiliary disease, small bowel obstruction. Lower c/f vascular catastrophe. Patient also feels lightheaded, no LOC or head trauma, likely dehydration in s/o GI losses, will replete with volume and reassess. He has no arrhythmia on EKG and his BP is stable.  He is also reporting some shortness of breath but he volunteers that it is mostly when he is having pain that it is hard to catch his breath.  He has clear lungs to auscultation bilaterally and no respiratory distress.  His imaging shows new compression fx at T10 and T12;  patient had not previously complained of back pain but after being informed of this finding he states his back has been hurting recently.  Cannot remember any specific trauma or moment when the pain started.  On exam he does have some mild tenderness with patient in the midline in the lower  thoracic area with no step-offs or deformities.  No neurologic symptoms that would indicate spinal cord injury and no retropulsion on the scan.  Patient's imaging demonstrates no acute findings in the chest abdomen or pelvis.  On reevaluation he states his symptoms have not improved at all after Protonix , fentanyl , fluids.  Given that he does complain of some epigastric pain with symptoms not improving after these treatments, consider ACS but his troponin is mildly elevated but flat.  He is subsequently given a GI cocktail also without improvement and will be given IV Dilaudid.  His lipase is very mildly elevated but he does have a history of chronic pancreatitis and so this could be misleading.  Consider pancreatitis still or gastritis in this patient.  Will admit to the hospitalist for IV pain control and fluids.  Clinical Course as of 07/31/24 9077  Thu Jul 28, 2024  1920 CT CHEST ABDOMEN PELVIS WO CONTRAST 1. Mild to moderate compression fractures at T10 and T12, as described above. There are new from prior and possibly acute. 2. Otherwise, not acute findings in the chest, abdomen, or pelvis. 3. Additional stable ancillary findings as above.   [HN]  2034 Patient reevaluated, states his pain has not improved much.  Will obtain a troponin and give GI cocktail.  CT chest abdomen pelvis is reassuring.consider gastritis or PUD is possible cause.  He does have chronic pancreas problems and takes Creon .  Does have a very mildly elevated lipase and possible he is having pancreatitis.   [HN]  2337 Troponin T High Sensitivity(!): 58 Mildly elevated/flat [HN]    Clinical Course User Index [HN] Franklyn Sid SAILOR, MD     Labs: I Ordered, and personally interpreted labs.  The pertinent results include: Those listed above  Imaging Studies ordered: I ordered imaging studies including CT abdomen pelvis I independently visualized and interpreted imaging. I agree with the radiologist interpretation  Additional history obtained from chart review.    Cardiac Monitoring: The patient was maintained on a cardiac monitor.  I personally viewed and interpreted the cardiac monitored which showed an underlying rhythm of: Normal sinus rhythm  Reevaluation: After the interventions noted above, I reevaluated the patient and found that they have :stayed the same  Social Determinants of Health:  lives independently  Disposition:  Admit to hospitalist  Co morbidities that complicate the patient evaluation  Past Medical History:  Diagnosis Date   AKI (acute kidney injury) 05/2020   possibly due to prostate blockage   Anxiety    Arthritis    Depression    Dyspnea    Glaucoma    Hypertension    Prostate cancer (HCC)    Renal disorder    Dialysis T/th/sat     Medicines Meds ordered this encounter  Medications   HYDROmorphone (DILAUDID) injection 0.5 mg   pantoprazole  (PROTONIX ) injection 40 mg    I have reviewed the patients home medicines and have made adjustments as needed  Problem List / ED Course: Problem List Items Addressed This Visit       Digestive   Nausea and vomiting     Other   * (Principal) Epigastric pain - Primary   Other Visit Diagnoses       Compression fracture of thoracic spine, non-traumatic, initial encounter [facility])                       This note was created using dictation software, which may contain spelling or grammatical errors.  Franklyn Sid SAILOR, MD 07/31/24 [phone]

## 2024-07-28 NOTE — Telephone Encounter (Signed)
 I called patient with recommendations from pcp no one answered so I left a voicemail to return my call.

## 2024-07-28 NOTE — ED Notes (Signed)
 Patient heard yelling 'hello' from nurses station so RN went to investigate upon hearing this. RN went into room to assess patient's needs and realized patient did not have call bell while asking patient why he was yelling and what he needed. Patient became very agitated, interrupting RN saying 'is it not ok to yell when something is wrong with me and theres no buzzer? Is that ok?' As well as dismissing RN, stating 'i'll accept your apology', terrible attitude, terrible response' when RN was trying to obtain an emesis bag for the patient.  Patient requesting new nurse at this time. Patient notified they are not able to obtain a new nurse. Patient then repeated 'you're dismissed' when RN attempted to give patient alcohol swabs for his nausea.

## 2024-07-28 NOTE — Progress Notes (Addendum)
 Responded to rapid response called by Radiology front desk. Patient sitting in wheelchair, leaning to one side, waiting for CT scan. Patient reported he had been dialyzed earlier in the day and had been feeling progressively worse throughout the late morning and into the early afternoon. Patient stated he felt dizzy, nauseated, and generally unwell. Asked patient if he had eaten yet today, patient stated he had not due to a poor appetite. CBG 107. Initial BP 105/62, recheck after five minutes was 93/62. SpO2 100% on room air, RR 30. Patient agreeable to going to the ED, reported off to receiving RN.

## 2024-07-28 NOTE — ED Notes (Signed)
 Patient ambulates to nurse station stating he wants to leave that nothing is being done for him and he wants to leave. That we are not doing the right thing. This nurse informed patient that we are waiting for all his test results to come back and for the doctor to come back in and talk with him. Patient turns and walks back to his room while this nurse is talking to him. JRPRN

## 2024-07-28 NOTE — ED Notes (Addendum)
 Ortho tech in to talk with patient about TLSO brace. This will not be done tonight due to staffing and will be placed on patient tomorrow if he is still in ED or Hospital. Patient verbalized understanding. JRPRN

## 2024-07-28 NOTE — ED Notes (Signed)
 Patient heard vomiting and found lying in the doorway on the floor. Md brought to bedside by another RN while this RN assisted patient. Per patient, he started off vomiting in the sink, then states nothing was coming up so he got down on all fours to vomit in the trash can. However, patient was lying with head on the floor vomiting yellow bile. Patient then able to move to a sitting and then standing position.

## 2024-07-29 ENCOUNTER — Encounter (HOSPITAL_COMMUNITY): Payer: Self-pay | Admitting: Internal Medicine

## 2024-07-29 ENCOUNTER — Observation Stay (HOSPITAL_COMMUNITY)

## 2024-07-29 DIAGNOSIS — R1013 Epigastric pain: Principal | ICD-10-CM | POA: Diagnosis present

## 2024-07-29 DIAGNOSIS — R112 Nausea with vomiting, unspecified: Secondary | ICD-10-CM | POA: Diagnosis present

## 2024-07-29 DIAGNOSIS — R42 Dizziness and giddiness: Secondary | ICD-10-CM

## 2024-07-29 DIAGNOSIS — R531 Weakness: Secondary | ICD-10-CM | POA: Diagnosis not present

## 2024-07-29 DIAGNOSIS — F32A Depression, unspecified: Secondary | ICD-10-CM | POA: Diagnosis not present

## 2024-07-29 DIAGNOSIS — Z743 Need for continuous supervision: Secondary | ICD-10-CM | POA: Diagnosis not present

## 2024-07-29 DIAGNOSIS — R109 Unspecified abdominal pain: Secondary | ICD-10-CM | POA: Diagnosis present

## 2024-07-29 DIAGNOSIS — R0600 Dyspnea, unspecified: Secondary | ICD-10-CM

## 2024-07-29 DIAGNOSIS — K861 Other chronic pancreatitis: Secondary | ICD-10-CM | POA: Diagnosis present

## 2024-07-29 DIAGNOSIS — R0609 Other forms of dyspnea: Secondary | ICD-10-CM | POA: Diagnosis not present

## 2024-07-29 DIAGNOSIS — M4854XA Collapsed vertebra, not elsewhere classified, thoracic region, initial encounter for fracture: Secondary | ICD-10-CM | POA: Diagnosis present

## 2024-07-29 DIAGNOSIS — N186 End stage renal disease: Secondary | ICD-10-CM

## 2024-07-29 DIAGNOSIS — R7989 Other specified abnormal findings of blood chemistry: Secondary | ICD-10-CM | POA: Diagnosis present

## 2024-07-29 DIAGNOSIS — S22080A Wedge compression fracture of T11-T12 vertebra, initial encounter for closed fracture: Secondary | ICD-10-CM | POA: Diagnosis present

## 2024-07-29 DIAGNOSIS — Z992 Dependence on renal dialysis: Secondary | ICD-10-CM

## 2024-07-29 DIAGNOSIS — S22070A Wedge compression fracture of T9-T10 vertebra, initial encounter for closed fracture: Secondary | ICD-10-CM | POA: Diagnosis not present

## 2024-07-29 LAB — COMPREHENSIVE METABOLIC PANEL WITH GFR
ALT: 8 U/L (ref 0–44)
AST: 14 U/L — ABNORMAL LOW (ref 15–41)
Albumin: 4.1 g/dL (ref 3.5–5.0)
Alkaline Phosphatase: 74 U/L (ref 38–126)
Anion gap: 13 (ref 5–15)
BUN: 27 mg/dL — ABNORMAL HIGH (ref 8–23)
CO2: 29 mmol/L (ref 22–32)
Calcium: 9.8 mg/dL (ref 8.9–10.3)
Chloride: 95 mmol/L — ABNORMAL LOW (ref 98–111)
Creatinine, Ser: 6.04 mg/dL — ABNORMAL HIGH (ref 0.61–1.24)
GFR, Estimated: 9 mL/min — ABNORMAL LOW (ref >60)
Glucose, Bld: 93 mg/dL (ref 70–99)
Potassium: 3.6 mmol/L (ref 3.5–5.1)
Sodium: 137 mmol/L (ref 135–145)
Total Bilirubin: 0.8 mg/dL (ref 0.0–1.2)
Total Protein: 6.6 g/dL (ref 6.5–8.1)

## 2024-07-29 LAB — CBC WITH DIFFERENTIAL/PLATELET
Abs Immature Granulocytes: 0.04 K/uL (ref 0.00–0.07)
Basophils Absolute: 0.1 K/uL (ref 0.0–0.1)
Basophils Relative: 1 %
Eosinophils Absolute: 0.1 K/uL (ref 0.0–0.5)
Eosinophils Relative: 2 %
HCT: 38 % — ABNORMAL LOW (ref 39.0–52.0)
Hemoglobin: 12.4 g/dL — ABNORMAL LOW (ref 13.0–17.0)
Immature Granulocytes: 1 %
Lymphocytes Relative: 28 %
Lymphs Abs: 2.1 K/uL (ref 0.7–4.0)
MCH: 30.2 pg (ref 26.0–34.0)
MCHC: 32.6 g/dL (ref 30.0–36.0)
MCV: 92.5 fL (ref 80.0–100.0)
Monocytes Absolute: 0.8 K/uL (ref 0.1–1.0)
Monocytes Relative: 11 %
Neutro Abs: 4.4 K/uL (ref 1.7–7.7)
Neutrophils Relative %: 57 %
Platelets: 196 K/uL (ref 150–400)
RBC: 4.11 MIL/uL — ABNORMAL LOW (ref 4.22–5.81)
RDW: 14.9 % (ref 11.5–15.5)
WBC: 7.5 K/uL (ref 4.0–10.5)
nRBC: 0 % (ref 0.0–0.2)

## 2024-07-29 LAB — ECHOCARDIOGRAM COMPLETE
AR max vel: 1.62 cm2
AV Area VTI: 1.78 cm2
AV Area mean vel: 1.81 cm2
AV Mean grad: 6.5 mmHg
AV Peak grad: 11.6 mmHg
Ao pk vel: 1.7 m/s
Area-P 1/2: 3.89 cm2
Height: 73 in
P 1/2 time: 575 ms
S' Lateral: 2.3 cm
Weight: 3120 [oz_av]

## 2024-07-29 LAB — MAGNESIUM: Magnesium: 2.1 mg/dL (ref 1.7–2.4)

## 2024-07-29 LAB — PHOSPHORUS: Phosphorus: 4 mg/dL (ref 2.5–4.6)

## 2024-07-29 LAB — HEPATITIS B SURFACE ANTIGEN: Hepatitis B Surface Ag: NONREACTIVE

## 2024-07-29 LAB — PRO BRAIN NATRIURETIC PEPTIDE: Pro Brain Natriuretic Peptide: 992 pg/mL — ABNORMAL HIGH (ref <300.0)

## 2024-07-29 MED ORDER — PENTAFLUOROPROP-TETRAFLUOROETH EX AERO: 1.0000 | INHALATION_SPRAY | CUTANEOUS | Status: DC | PRN

## 2024-07-29 MED ORDER — HYDROMORPHONE HCL 1 MG/ML IJ SOLN
0.5000 mg | INTRAMUSCULAR | Status: DC | PRN
  Administered 2024-08-01 – 2024-08-03 (×5): 0.5 mg via INTRAVENOUS
  Filled 2024-07-29 (×7): qty 0.5

## 2024-07-29 MED ORDER — LATANOPROST 0.005 % OP SOLN
1.0000 [drp] | Freq: Every day | OPHTHALMIC | Status: DC
  Administered 2024-07-29 – 2024-08-02 (×5): 1 [drp] via OPHTHALMIC
  Filled 2024-07-29: qty 2.5

## 2024-07-29 MED ORDER — LACTATED RINGERS IV SOLN: INTRAVENOUS | Status: AC

## 2024-07-29 MED ORDER — TIMOLOL MALEATE 0.5 % OP SOLN
1.0000 [drp] | Freq: Every morning | OPHTHALMIC | Status: DC
  Administered 2024-07-30 – 2024-08-03 (×5): 1 [drp] via OPHTHALMIC
  Filled 2024-07-29: qty 5

## 2024-07-29 MED ORDER — HEPARIN SODIUM (PORCINE) 1000 UNIT/ML DIALYSIS: 1000.0000 [IU] | INTRAMUSCULAR | Status: DC | PRN

## 2024-07-29 MED ORDER — ACETAMINOPHEN 325 MG PO TABS
650.0000 mg | ORAL_TABLET | Freq: Four times a day (QID) | ORAL | Status: DC | PRN
  Administered 2024-07-31 – 2024-08-03 (×5): 650 mg via ORAL
  Filled 2024-07-29 (×6): qty 2

## 2024-07-29 MED ORDER — SEVELAMER CARBONATE 800 MG PO TABS
1600.0000 mg | ORAL_TABLET | Freq: Three times a day (TID) | ORAL | Status: DC
Start: 2024-07-29 — End: 2024-08-03
  Administered 2024-07-29 – 2024-08-03 (×12): 1600 mg via ORAL
  Filled 2024-07-29 (×12): qty 2

## 2024-07-29 MED ORDER — SODIUM CHLORIDE 0.9 % IV BOLUS: 1000.0000 mL | Freq: Once | INTRAVENOUS | Status: DC

## 2024-07-29 MED ORDER — CHLORHEXIDINE GLUCONATE CLOTH 2 % EX PADS
6.0000 | MEDICATED_PAD | Freq: Every day | CUTANEOUS | Status: DC
  Administered 2024-07-30 – 2024-08-03 (×4): 6 via TOPICAL

## 2024-07-29 MED ORDER — LIDOCAINE-PRILOCAINE 2.5-2.5 % EX CREA: 1.0000 | TOPICAL_CREAM | CUTANEOUS | Status: DC | PRN

## 2024-07-29 MED ORDER — HEPARIN SODIUM (PORCINE) 1000 UNIT/ML DIALYSIS: 3500.0000 [IU] | Freq: Once | INTRAMUSCULAR | Status: DC

## 2024-07-29 MED ORDER — ANTICOAGULANT SODIUM CITRATE 4% (200MG/5ML) IV SOLN: 5.0000 mL | Status: DC | PRN

## 2024-07-29 MED ORDER — PANCRELIPASE (LIP-PROT-AMYL) 36000-114000 UNITS PO CPEP
72000.0000 [IU] | ORAL_CAPSULE | Freq: Three times a day (TID) | ORAL | Status: DC
  Administered 2024-07-29 – 2024-08-03 (×12): 72000 [IU] via ORAL
  Filled 2024-07-29 (×15): qty 2

## 2024-07-29 MED ORDER — ALTEPLASE 2 MG IJ SOLR
2.0000 mg | Freq: Once | INTRAMUSCULAR | Status: AC | PRN
  Administered 2024-07-30: 2 mg

## 2024-07-29 MED ORDER — FAMOTIDINE 20 MG PO TABS
20.0000 mg | ORAL_TABLET | Freq: Every day | ORAL | Status: DC
  Administered 2024-07-29 – 2024-08-03 (×6): 20 mg via ORAL
  Filled 2024-07-29 (×7): qty 1

## 2024-07-29 MED ORDER — NALOXONE HCL 0.4 MG/ML IJ SOLN: 0.4000 mg | INTRAMUSCULAR | Status: DC | PRN

## 2024-07-29 MED ORDER — MELATONIN 3 MG PO TABS
3.0000 mg | ORAL_TABLET | Freq: Every evening | ORAL | Status: DC | PRN
  Administered 2024-07-29 – 2024-08-01 (×3): 3 mg via ORAL
  Filled 2024-07-29 (×3): qty 1

## 2024-07-29 MED ORDER — SERTRALINE HCL 100 MG PO TABS
100.0000 mg | ORAL_TABLET | Freq: Every day | ORAL | Status: DC
  Administered 2024-07-29 – 2024-08-03 (×6): 100 mg via ORAL
  Filled 2024-07-29 (×3): qty 1
  Filled 2024-07-29: qty 2
  Filled 2024-07-29 (×2): qty 1

## 2024-07-29 MED ORDER — PANTOPRAZOLE SODIUM 40 MG IV SOLR
40.0000 mg | Freq: Two times a day (BID) | INTRAVENOUS | Status: DC
  Administered 2024-07-29 – 2024-08-03 (×10): 40 mg via INTRAVENOUS
  Filled 2024-07-29 (×11): qty 10

## 2024-07-29 MED ORDER — NEPRO/CARBSTEADY PO LIQD: 237.0000 mL | ORAL | Status: DC | PRN

## 2024-07-29 MED ORDER — ONDANSETRON HCL 4 MG/2ML IJ SOLN
4.0000 mg | Freq: Four times a day (QID) | INTRAMUSCULAR | Status: DC | PRN
  Administered 2024-08-01: 4 mg via INTRAVENOUS
  Filled 2024-07-29: qty 2

## 2024-07-29 MED ORDER — CINACALCET HCL 30 MG PO TABS
30.0000 mg | ORAL_TABLET | ORAL | Status: DC
Start: 2024-07-30 — End: 2024-08-03
  Administered 2024-07-30 – 2024-08-02 (×2): 30 mg via ORAL
  Filled 2024-07-29 (×2): qty 1

## 2024-07-29 MED ORDER — HEPARIN SODIUM (PORCINE) 1000 UNIT/ML DIALYSIS: 1500.0000 [IU] | INTRAMUSCULAR | Status: DC | PRN

## 2024-07-29 MED ORDER — LIDOCAINE HCL (PF) 1 % IJ SOLN: 5.0000 mL | INTRAMUSCULAR | Status: DC | PRN

## 2024-07-29 MED ORDER — ACETAMINOPHEN 650 MG RE SUPP: 650.0000 mg | Freq: Four times a day (QID) | RECTAL | Status: DC | PRN

## 2024-07-29 NOTE — Progress Notes (Signed)
 Orthopedic Tech Progress Note Patient Details:  Manuel Ortega 21-Jan-1951 [id number]  Patient ID: Manuel Ortega, male   DOB: Jun 28, 1951, 73 y.o.   MRN: [id number] Pt. Declined back brace. States he has very minimal to no  back pain. Unsure why the brace was ordered. Adine MARLA Blush 07/29/2024, 7:06 PM

## 2024-07-29 NOTE — ED Notes (Signed)
 Diet has been changed to regular diet.

## 2024-07-29 NOTE — Consult Note (Signed)
 Renal Service Consult Note [facility] Manuel JONETTA Fret, MD  Patient: Manuel Ortega Date: 07/29/2024 Requesting Physician: Dr. Sherrill  Reason for Consult: ESRD pt w/ abdominal pain, dizziness and SOB HPI: The patient is a 73 y.o. year-old w/ PMH as below who presented w/ abdominal pain, epigastric, and some SOB. He also has some N/V and poor appetite the prior 2-3 days. Hx PUD w/ bleeding DU in Jan 2025. No nsaids. In ED HR 85, BP 110/70, RR 16, 98% sats on RA.  K+ 3.8, LFT's ok, bun 21, creat 5.0. CT chest/ abd / pelvis was done and chest showed no edema. Pt was started on IV PPI, pain meds, clear liquids. Pt was admitted. We are asked to see for ESRD.     Pt seen in ED room. Has been on HD for 3 years. Moved to [facility] from [place], [place].  Hx as above, no SOB or leg swelling. Says he never swells up because he makes the same amount of urine as prior to being on dialysis. Also wants a regular diet cause of his good UOP.    ROS - denies CP, no joint pain, no HA, no blurry vision, no rash   Past Medical History  Past Medical History:  Diagnosis Date   AKI (acute kidney injury) 05/2020   possibly due to prostate blockage   Anxiety    Arthritis    Chronic pancreatitis (HCC)    Depression    Dyspnea    ESRD (end stage renal disease) (HCC)    on HD (T,Th,Sat)   Glaucoma    Hypertension    Prostate cancer (HCC)    PUD (peptic ulcer disease)    Renal disorder    Dialysis T/th/sat   Past Surgical History  Past Surgical History:  Procedure Laterality Date   APPENDECTOMY     BIOPSY  10/15/2023   Procedure: BIOPSY;  Surgeon: Leigh Elspeth SQUIBB, MD;  Location: [facility] ENDOSCOPY;  Service: Gastroenterology;;   DIALYSIS/PERMA CATHETER INSERTION N/A 09/23/2023   Procedure: DIALYSIS/PERMA CATHETER INSERTION;  Surgeon: Melia Lynwood ORN, MD;  Location: [facility] INVASIVE CV LAB;  Service: Cardiovascular;  Laterality: N/A;   DIALYSIS/PERMA CATHETER REMOVAL N/A 09/23/2023   Procedure:  DIALYSIS/PERMA CATHETER REMOVAL;  Surgeon: Melia Lynwood ORN, MD;  Location: [facility] INVASIVE CV LAB;  Service: Cardiovascular;  Laterality: N/A;   ESOPHAGOGASTRODUODENOSCOPY (EGD) WITH PROPOFOL  N/A 10/15/2023   Procedure: ESOPHAGOGASTRODUODENOSCOPY (EGD) WITH PROPOFOL ;  Surgeon: Leigh Elspeth SQUIBB, MD;  Location: [facility] ENDOSCOPY;  Service: Gastroenterology;  Laterality: N/A;   growth in nostril     biopsied negatiove   IR FLUORO GUIDE CV LINE RIGHT  05/12/2020   IR FLUORO GUIDE CV LINE RIGHT  05/28/2020   IR FLUORO GUIDE CV LINE RIGHT  02/09/2024   IR REMOVAL TUN CV CATH W/O FL  02/07/2024   IR US  GUIDE VASC ACCESS RIGHT  05/12/2020   IR US  GUIDE VASC ACCESS RIGHT  05/28/2020   IR US  GUIDE VASC ACCESS RIGHT  02/09/2024   TRANSURETHRAL RESECTION OF PROSTATE N/A 09/13/2020   Procedure: TRANSURETHRAL RESECTION OF THE PROSTATE (TURP) WITH CYSTOSCOPY;  Surgeon: Renda Glance, MD;  Location: [facility] ORS;  Service: Urology;  Laterality: N/A;   TUNNELLED CATHETER EXCHANGE N/A 04/04/2024   Procedure: TUNNELLED CATHETER EXCHANGE;  Surgeon: Magda Debby SAILOR, MD;  Location: [facility] PV LAB;  Service: Cardiovascular;  Laterality: N/A;   Family History  Family History  Problem Relation Age of Onset   Stroke Mother    Breast cancer  Neg Hx    Colon cancer Neg Hx    Prostate cancer Neg Hx    Pancreatic cancer Neg Hx    Social History  reports that he has never smoked. He has never been exposed to tobacco smoke. He has never used smokeless tobacco. He reports that he does not currently use alcohol after a past usage of about 1.0 standard drink of alcohol per week. He reports that he does not currently use drugs. Allergies No Known Allergies Home medications Prior to Admission medications   Medication Sig Start Date End Date Taking? Authorizing Provider  calcitRIOL  (ROCALTROL ) 0.25 MCG capsule Take 5 capsules (1.25 mcg total) by mouth Every Tuesday,Thursday,and Saturday with dialysis. 02/11/24  Yes Elgergawy, Brayton RAMAN, MD   CREON  36000-114000 units CPEP capsule Take 36,000-72,000 Units by mouth See admin instructions. Take 72,000 units by mouth three time a day with meals and 36,000 units three times a day with snacks   Yes [provider]  diphenhydrAMINE  (BENADRYL ) 25 mg capsule Take 25 mg by mouth every 4 (four) hours as needed for itching or allergies.   Yes [provider]  LUMIGAN  0.01 % SOLN Place 1 drop into both eyes at bedtime. 03/08/24  Yes Lorren, Amy J, NP  QUEtiapine  Fumarate (SEROQUEL  XR) 150 MG 24 hr tablet Take 300 mg by mouth at bedtime. 11/27/23  Yes [provider]  sertraline  (ZOLOFT ) 100 MG tablet Take 1 tablet (100 mg total) by mouth in the morning and at bedtime. Patient taking differently: Take 100 mg by mouth daily. 06/29/24 09/27/24 Yes Lorren, Amy J, NP  sevelamer  carbonate (RENVELA ) 800 MG tablet Take 1,600 mg by mouth 3 (three) times daily. 02/23/24  Yes [provider]  sildenafil (VIAGRA) 100 MG tablet Take 100 mg by mouth as needed for erectile dysfunction. 12/17/23  Yes [provider]  timolol  (TIMOPTIC ) 0.5 % ophthalmic solution Place 1 drop into both eyes every morning. 05/17/24  Yes Tanda Bleacher, MD  TYLENOL  500 MG tablet Take 500-1,000 mg by mouth every 6 (six) hours as needed for mild pain (pain score 1-3) or headache.   Yes [provider]  amLODipine  (NORVASC ) 5 MG tablet Take 1 tablet by mouth daily. Patient not taking: Reported on 07/28/2024 04/12/24   [provider]  cinacalcet  (SENSIPAR ) 30 MG tablet Take 1 tablet (30 mg total) by mouth Every Tuesday,Thursday,and Saturday with dialysis. 02/11/24   Elgergawy, Brayton RAMAN, MD  ferrous sulfate  325 (65 FE) MG EC tablet Take 325 mg by mouth daily with breakfast.    [provider]  Methoxy PEG-Epoetin Beta (MIRCERA IJ) 30 mcg. 02/16/24 02/14/25  [provider]  mirtazapine  (REMERON ) 15 MG tablet Take 15 mg by mouth daily. Patient not taking: Reported on  07/28/2024 11/22/23   [provider]  montelukast  (SINGULAIR ) 10 MG tablet Take 10 mg by mouth daily. Patient not taking: Reported on 07/28/2024 12/18/23   [provider]  pantoprazole  (PROTONIX ) 40 MG tablet Take 1 tablet (40 mg total) by mouth 2 (two) times daily. Patient not taking: Reported on 07/28/2024 10/15/23   Odell Celinda Balo, MD  Travoprost , BAK Free, (TRAVATAN ) 0.004 % SOLN ophthalmic solution Place 1 drop into both eyes at bedtime. Patient not taking: Reported on 07/28/2024 05/17/24   Tanda Bleacher, MD     Vitals:   07/29/24 0339 07/29/24 0715 07/29/24 0956 07/29/24 1338  BP: 127/74 (!) 105/51 127/66 113/70  Pulse: 75 77 84 86  Resp: 19 18 18  18  Temp: (!) 97.5 F (36.4 C) (!) 97.5 F (36.4 C) 97.8 F (36.6 C) 97.8 F (36.6 C)  TempSrc: Oral Oral Oral Oral  SpO2: 98% 99% 100% 100%  Weight:      Height:       Exam Gen alert, no distress, RA Sclera anicteric, throat clear  No jvd or bruits Chest clear bilat to bases RRR no MRG Abd soft ntnd no mass or ascites +bs Ext no LE or UE edema, no other edema Neuro is alert, Ox 3 , nf    RIJ TDC intact  Home bp meds: Norvasc  5 mg qd   OP HD: TTS SW 4h  B400  89.5kg   3K bath  RIJ TDC  Heparin  5000 Last OP HD 10/16, post wt 89kg Usually gets to the dry wt Dry wt lowered recently Goes to all sessions, signs off 15-30 min early 1/2 the time    Assessment/ Plan: Abdominal pain: CT abdomen done. H/o chronic pancreatitis. Per pmd.   ESRD: on HD TTS. No missed HD. [facility] HD Sat at [facility].  HTN/vol: sbp's 100- 125, no need for any bp lowering meds. Got 1 L LR in ED yesterday. May still be a bit dry, will give another 1 L bolus, cont LR at 40 cc/min. No signs of overload. Anemia of esrd: Hb 12-14 here, may be due to dehydration. Follow. No esa needs.    Myer Fret  MD [facility] 07/29/2024, 2:14 PM  Recent Labs  Lab 07/28/24 1622 07/29/24 0500  HGB 13.4 12.4*  ALBUMIN  4.6 4.1  CALCIUM  10.4* 9.8  PHOS   --  4.0  CREATININE 5.07* 6.04*  K 3.8 3.6   Inpatient medications:  [START ON 07/30/2024] cinacalcet   30 mg Oral Q T,Th,Sa-HD   famotidine  20 mg Oral Daily   latanoprost   1 drop Both Eyes QHS   lipase/protease/amylase  36,000-72,000 Units Oral See admin instructions   pantoprazole  (PROTONIX ) IV  40 mg Intravenous Q12H   sertraline   100 mg Oral Daily   sevelamer  carbonate  1,600 mg Oral TID   timolol   1 drop Both Eyes q morning    lactated ringers  40 mL/hr at 07/29/24 0953   acetaminophen  **OR** acetaminophen , HYDROmorphone (DILAUDID) injection, melatonin, naLOXone (NARCAN)  injection, ondansetron  (ZOFRAN ) IV

## 2024-07-29 NOTE — Progress Notes (Signed)
 Care started prior to midnight in the emergency room and the patient was admitted early this AM after midnight by Dr. Eva Pore and I am in current agreement w/ his A and P.  Additional changes to the plan of care been made accordingly.  The patient is a 73 year old African-American Ortega with a past medical history significant for but not limited to peptic ulcer disease, chronic pancreatitis ESRD on HD on Tuesday Thursday Saturday who presented to the hospital for epigastric pain associated with recurrent nausea and 3-4 daily episodes of nonbloody nonbilious emesis.  Additionally he has been having dyspnea and lightheadness with him feeling like he was going to pass out. CT of the chest/abdomen/pelvis was done and showed mild to moderate compression fractures at T10 and T12 which were new from prior and possibly acute but an otherwise, not acute findings in the chest, abdomen, or pelvis.  He was placed on IV PPI twice daily as well as famotidine and given antiemetics and pain medications.  Clear liquid diet being advanced as tolerated slowly to Regular Diet.  Patient is currently being transferred to [facility] given his ESRD and need for hemodialysis TThSat.  Currently being observed for the following but not limited too:  Assessment and Plan:  Abdominal Epigastric Pain associated w/ Vomiting: CLD being advanced to FLD to Regular Diet, Supportive Care. IV PPI and Pepcid. Antiemetics. Gentle IVF w/ LR @ 40 mL/hr (Already s/p 1 L of LR bolus). If not improving or worsening consider GI Evaluation (sees GI at [facility]). Also s/p Maalox/Myland + Lidocain Viscous. Pain control and Analgesia w/ Hydromorphone 0.5 mg po q2hprn and Acetaminophen  650 mg po q6hprn Mild Pain  Dyspnea: Check BNP and ECHO. CT Chest/Abd/Pelvis showed no findings in the chest. Not wearing supplemental O2. Check Ambulatory Home O2 Screen.   Lightheadedness/Near Syncope: Checking ECHO. Troponin Mildly elevated but flat and trended down.  PT/OT. Gentle IVF Hydration as above. Check Orthostatic VS; Monitor on Tele for any Arrhythmias   ESRD on HD TThSat: BUN/Cr Trend: Recent Labs  Lab 10/Manuel/25 1622 07/29/24 0500  BUN 21 27*  CREATININE 5.07* 6.04*  -Gentle IVF as Above. C/w Cinacalcet  30 mg TThSat and Sevelamer  Carbonate 1600 mg po TID -Avoid Nephrotoxic Medications, Contrast Dyes, Hypotension and Dehydration to Ensure Adequate Renal Perfusion and will need to Renally Adjust Meds -Continue to Monitor and Trend Renal Function carefully and repeat CMP in the AM  -Notify Nephrology for Non-Urgent Consult; Patient still urinates some   Chronic Pancreatitis: Lipase Level was 79. C/w Lipase/Protease/Amylase 36,000-72,000 units as indicated. Diet Advanced as above but if Abd Pain and Sx reoccur will need to cut Diet back.   Hypercalcemia: Calcium  Level Trend:  Recent Labs  Lab 10/Manuel/25 1622 07/29/24 0500  CALCIUM  10.4* 9.8  -CTM and Trend and repeat CMP in the AM  T10/T12 Compression Fx:  Asymptomatic. Noted on CT Chest/Abd/Pelvis and noted to be Mild to Moderate and new and possibly acute. TSLO Brace, PT/OT Consults; Pain Control as above if needed. If becomes symptomatic and painful will need MRI of Backand Consideration for Kyphoplasty   Normocytic Anemia/Anemia of Chronic Kidney Disease:  Recent Labs  Lab 10/Manuel/25 1622 07/29/24 0500  HGB 13.4 12.4*  HCT 41.8 38.0*  MCV 90.9 92.5  -Check Anemia Panel in the AM. CTM for S/Sx of Bleeding; No overt bleeding noted. Repeat CBC in the AM  Depression and Anxiety: C/w Sertraline  100 mg po Daily   Glaucoma: C/w Latanoprost  0.005% Ophthalmic Solution qHS and  Timolol  0.5% 1 drop Both Eyes every Morning   Overweight: Complicates overall prognosis and care. Estimated body mass index is 25.73 kg/m as calculated from the following:   Height as of this encounter: 6' 1 (1.854 m).   Weight as of this encounter: 88.5 kg. Weight Loss and Dietary Counseling given

## 2024-07-29 NOTE — Plan of Care (Signed)

## 2024-07-29 NOTE — ED Notes (Addendum)
[facility] has been called for transport.  

## 2024-07-29 NOTE — H&P (Signed)
 History and Physical      Manuel Ortega FMW:[id number] DOB: 02-Jun-1951 DOA: 07/28/2024; DOS: 07/29/2024  PCP: Lorren Greig PARAS, NP  Patient coming from: home   I have personally briefly reviewed patient's old medical records in [facility] Link  Chief Complaint: epigastric pain  HPI: Manuel Ortega is a 73 y.o. male with medical history significant for peptic ulcer disease, chronic pancreatitis, end-stage renal disease on hemodialysis on Tuesday, Thursday, Saturday schedule, who is admitted to [facility] on 07/28/2024 with acute epigastric pain after presenting from home to [facility] ED complaining of epigastric pain.   The patient reports 2 days of persistent sharp, nonradiating epigastric discomfort, worse with palpation and associated with recurrent nausea resulting in at least 3-4 daily episodes of nonbloody, nonbilious emesis over that timeframe.  Not associate with any new diarrhea, melena, or hematochezia.  Not associate with any hematemesis nor any recent subjective fever, chills, rigors, or generalized myalgias.  No recent trauma/fall.  In the context of the patient's recurrent nausea/vomiting, the patient reports significant decline in oral intake over the last 2 days, and notes that he has been unable to tolerate his oral medications over that timeframe.  The patient conveys a history of peptic ulcer disease, diagnosed in January 2025 via EGD.  He reports that the epigastric discomfort with which he presents this evening is very similar in quality, distribution and intensity relative to the epigastric discomfort that he was experiencing at the time of his bleeding duodenal ulcer in January 2025.  However, a notable difference is that he is experiencing currently no associated hematemesis.  Not on any blood thinners as an outpatient, including no aspirin.  He conveys that he typically does not take the daily oral Protonix  that he is prescribed in the context of his history of peptic  ulcer disease.  No NSAID use.  Denies any recent back discomfort.  No recent acute focal weakness. no recent chest pain, shortness of breath.  His medical history is notable for end-stage renal disease on hemodialysis, on Tuesday, Thursday, Saturday schedule.  He conveys that he has noticed a recent hemodialysis sessions, and was able to complete the totality of his scheduled hemodialysis session on Thursday, 07/28/2024.  His next due for routine hemodialysis on Saturday, 07/30/2024.  Medical history also notable for chronic pancreatitis for which she is on Creon  supplementation as an outpatient.    ED Course:  Vital signs in the ED were notable for the following: Afebrile; heart rates in the 80s to 90s; systolic blood pressures in the low 100s to 130s; respiratory rate 16-20, oxygen saturation 97 to 100% on room air.  Labs were notable for the following: CMP was notable for potassium 3.8, bicarbonate 26, glucose 106, calcium  10.4, liver enzymes were within normal limits.  Lipase 79 compared to 99 in April 2025 and compared to 190 in July 2021.  High sensitive troponin T initially was 62, with repeat value trending down to 58, without any prior high-sensitivity troponin T value available for point comparison.  CBC notable for white blood cell count 8500, hemoglobin 13.4.  Per my interpretation, EKG in ED demonstrated the following: EKG shows sinus rhythm with a rate 93, normal intervals, nonspecific T wave flattening in aVL, will demonstrating no evidence of ST changes, including no evidence of ST elevation.  Imaging in the ED, per corresponding formal radiology read, was notable for the following: CT chest without contrast, showed no evidence of acute cardiopulmonary process, including no evidence of infiltrate,  edema, effusion, or pneumothorax.  CT abdomen/pelvis, in comparison to CT abdomen/pelvis performed on 01/16/2024, shows no evidence of acute intra-abdominal or acute intrapelvic process,  including no evidence of acute pancreatic findings nor any evidence of gallbladder wall thickening, pericholecystic fluid, stones, or any evidence of common bile duct dilation or choledocholithiasis.  This imaging also showed no evidence of gastric abnormality, including no overt evidence of gastric ulceration, while showing sigmoid diverticulosis in the absence of acute diverticulitis.  No evidence of bowel obstruction, perforation, or abscess.  CT abdomen/pelvis also showed mild superior endplate compression fracture at T10, as well as moderate superior endplate compression fracture at T12, with approximate 40% height loss, and minimal retropulsion.  While in the ED, the following were administered: Maalox/Mylanta with viscous lidocaine  x 1 dose, fentanyl  50 mcg IV x 1 dose, Dilaudid 0.5 mg IV x 1, Protonix  40 mg IV x 1 dose, lactated Ringer 's x 1 L bolus, followed by initiation of continuous LR running at 100 cc/h.  Subsequently, the patient was admitted for further evaluation management of presenting epigastric discomfort.     Review of Systems: As per HPI otherwise 10 point review of systems negative.   Past Medical History:  Diagnosis Date   AKI (acute kidney injury) 05/2020   possibly due to prostate blockage   Anxiety    Arthritis    Depression    Dyspnea    Glaucoma    Hypertension    Prostate cancer [facility])    Renal disorder    Dialysis T/th/sat    Past Surgical History:  Procedure Laterality Date   APPENDECTOMY     BIOPSY  10/15/2023   Procedure: BIOPSY;  Surgeon: Leigh Elspeth SQUIBB, MD;  Location: [facility] ENDOSCOPY;  Service: Gastroenterology;;   DIALYSIS/PERMA CATHETER INSERTION N/A 09/23/2023   Procedure: DIALYSIS/PERMA CATHETER INSERTION;  Surgeon: Melia Lynwood ORN, MD;  Location: [facility] INVASIVE CV LAB;  Service: Cardiovascular;  Laterality: N/A;   DIALYSIS/PERMA CATHETER REMOVAL N/A 09/23/2023   Procedure: DIALYSIS/PERMA CATHETER REMOVAL;  Surgeon: Melia Lynwood ORN, MD;  Location: [facility]  INVASIVE CV LAB;  Service: Cardiovascular;  Laterality: N/A;   ESOPHAGOGASTRODUODENOSCOPY (EGD) WITH PROPOFOL  N/A 10/15/2023   Procedure: ESOPHAGOGASTRODUODENOSCOPY (EGD) WITH PROPOFOL ;  Surgeon: Leigh Elspeth SQUIBB, MD;  Location: [facility] ENDOSCOPY;  Service: Gastroenterology;  Laterality: N/A;   growth in nostril     biopsied negatiove   IR FLUORO GUIDE CV LINE RIGHT  05/12/2020   IR FLUORO GUIDE CV LINE RIGHT  05/28/2020   IR FLUORO GUIDE CV LINE RIGHT  02/09/2024   IR REMOVAL TUN CV CATH W/O FL  02/07/2024   IR US  GUIDE VASC ACCESS RIGHT  05/12/2020   IR US  GUIDE VASC ACCESS RIGHT  05/28/2020   IR US  GUIDE VASC ACCESS RIGHT  02/09/2024   TRANSURETHRAL RESECTION OF PROSTATE N/A 09/13/2020   Procedure: TRANSURETHRAL RESECTION OF THE PROSTATE (TURP) WITH CYSTOSCOPY;  Surgeon: Renda Glance, MD;  Location: [facility] ORS;  Service: Urology;  Laterality: N/A;   TUNNELLED CATHETER EXCHANGE N/A 04/04/2024   Procedure: TUNNELLED CATHETER EXCHANGE;  Surgeon: Magda Debby SAILOR, MD;  Location: [facility] PV LAB;  Service: Cardiovascular;  Laterality: N/A;    Social History:  reports that he has never smoked. He has never been exposed to tobacco smoke. He has never used smokeless tobacco. He reports that he does not currently use alcohol after a past usage of about 1.0 standard drink of alcohol per week. He reports that he does not currently use drugs.   No  Known Allergies  Family History  Problem Relation Age of Onset   Stroke Mother    Breast cancer Neg Hx    Colon cancer Neg Hx    Prostate cancer Neg Hx    Pancreatic cancer Neg Hx     Family history reviewed and not pertinent    Prior to Admission medications   Medication Sig Start Date End Date Taking? Authorizing Provider  calcitRIOL  (ROCALTROL ) 0.25 MCG capsule Take 5 capsules (1.25 mcg total) by mouth Every Tuesday,Thursday,and Saturday with dialysis. 02/11/24  Yes Elgergawy, Brayton RAMAN, MD  CREON  36000-114000 units CPEP capsule Take 36,000-72,000 Units  by mouth See admin instructions. Take 72,000 units by mouth three time a day with meals and 36,000 units three times a day with snacks   Yes [provider]  diphenhydrAMINE  (BENADRYL ) 25 mg capsule Take 25 mg by mouth every 4 (four) hours as needed for itching or allergies.   Yes [provider]  LUMIGAN  0.01 % SOLN Place 1 drop into both eyes at bedtime. 03/08/24  Yes Lorren, Amy J, NP  QUEtiapine  Fumarate (SEROQUEL  XR) 150 MG 24 hr tablet Take 300 mg by mouth at bedtime. 11/27/23  Yes [provider]  sertraline  (ZOLOFT ) 100 MG tablet Take 1 tablet (100 mg total) by mouth in the morning and at bedtime. Patient taking differently: Take 100 mg by mouth daily. 06/29/24 09/27/24 Yes Lorren, Amy J, NP  sevelamer  carbonate (RENVELA ) 800 MG tablet Take 1,600 mg by mouth 3 (three) times daily. 02/23/24  Yes [provider]  sildenafil (VIAGRA) 100 MG tablet Take 100 mg by mouth as needed for erectile dysfunction. 12/17/23  Yes [provider]  timolol  (TIMOPTIC ) 0.5 % ophthalmic solution Place 1 drop into both eyes every morning. 05/17/24  Yes Tanda Bleacher, MD  TYLENOL  500 MG tablet Take 500-1,000 mg by mouth every 6 (six) hours as needed for mild pain (pain score 1-3) or headache.   Yes [provider]  amLODipine  (NORVASC ) 5 MG tablet Take 1 tablet by mouth daily. Patient not taking: Reported on 07/28/2024 04/12/24   [provider]  cinacalcet  (SENSIPAR ) 30 MG tablet Take 1 tablet (30 mg total) by mouth Every Tuesday,Thursday,and Saturday with dialysis. 02/11/24   Elgergawy, Brayton RAMAN, MD  ferrous sulfate  325 (65 FE) MG EC tablet Take 325 mg by mouth daily with breakfast.    [provider]  Methoxy PEG-Epoetin Beta (MIRCERA IJ) 30 mcg. 02/16/24 02/14/25  [provider]  mirtazapine  (REMERON ) 15 MG tablet Take 15 mg by mouth daily. Patient not taking: Reported on 07/28/2024 11/22/23   [provider]  montelukast  (SINGULAIR )  10 MG tablet Take 10 mg by mouth daily. Patient not taking: Reported on 07/28/2024 12/18/23   [provider]  pantoprazole  (PROTONIX ) 40 MG tablet Take 1 tablet (40 mg total) by mouth 2 (two) times daily. Patient not taking: Reported on 07/28/2024 10/15/23   Odell Celinda Balo, MD  Travoprost , BAK Free, (TRAVATAN ) 0.004 % SOLN ophthalmic solution Place 1 drop into both eyes at bedtime. Patient not taking: Reported on 07/28/2024 05/17/24   Tanda Bleacher, MD     Objective    Physical Exam: Vitals:   07/28/24 1830 07/28/24 2019 07/28/24 2115 07/28/24 2323  BP:  118/78 110/70 107/76  Pulse: 89 88 86 85  Resp: 17 17 16 17   Temp:  99.1 F (37.3 C)  97.9 F (36.6 C)  TempSrc:  Oral  Oral  SpO2: 100% 100% 97% 100%  Weight:      Height:        General: appears to be stated age; alert, oriented Skin: warm, dry, no rash Head:  AT/[place] Mouth:  Oral mucosa membranes appear moist, normal dentition Neck: supple; trachea midline Heart:  RRR; did not appreciate any M/R/G Lungs: CTAB, did not appreciate any wheezes, rales, or rhonchi Abdomen: + BS; soft, ND, tenderness over the epigastrium, in the absence of any associated guarding, rigidity, or rebound tenderness Vascular: 2+ pedal pulses b/l; 2+ radial pulses b/l Extremities: no peripheral edema, no muscle wasting   Labs on Admission: I have personally reviewed following labs and imaging studies  CBC: Recent Labs  Lab 07/28/24 1622  WBC 8.5  NEUTROABS 5.5  HGB 13.4  HCT 41.8  MCV 90.9  PLT 226   Basic Metabolic Panel: Recent Labs  Lab 07/28/24 1622  NA 138  K 3.8  CL 93*  CO2 26  GLUCOSE 106*  BUN 21  CREATININE 5.07*  CALCIUM  10.4*   GFR: Estimated Creatinine Clearance: 14.9 mL/min (A) (by C-G formula based on SCr of 5.07 mg/dL (H)). Liver Function Tests: Recent Labs  Lab 07/28/24 1622  AST 18  ALT 7  ALKPHOS 87  BILITOT 1.0  PROT 7.9  ALBUMIN  4.6   Recent Labs  Lab 07/28/24 1622  LIPASE 79*    No results for input(s): AMMONIA in the last 168 hours. Coagulation Profile: No results for input(s): INR, PROTIME in the last 168 hours. Cardiac Enzymes: No results for input(s): CKTOTAL, CKMB, CKMBINDEX, TROPONINI in the last 168 hours. BNP (last 3 results) No results for input(s): PROBNP in the last 8760 hours. HbA1C: No results for input(s): HGBA1C in the last 72 hours. CBG: Recent Labs  Lab 07/28/24 1430  GLUCAP 107*   Lipid Profile: No results for input(s): CHOL, HDL, LDLCALC, TRIG, CHOLHDL, LDLDIRECT in the last 72 hours. Thyroid Function Tests: No results for input(s): TSH, T4TOTAL, FREET4, T3FREE, THYROIDAB in the last 72 hours. Anemia Panel: No results for input(s): VITAMINB12, FOLATE, FERRITIN, TIBC, IRON , RETICCTPCT in the last 72 hours. Urine analysis:    Component Value Date/Time   COLORURINE STRAW (A) 02/04/2024 2010   APPEARANCEUR CLEAR 02/04/2024 2010   LABSPEC 1.009 02/04/2024 2010   PHURINE 8.0 02/04/2024 2010   GLUCOSEU NEGATIVE 02/04/2024 2010   HGBUR SMALL (A) 02/04/2024 2010   BILIRUBINUR NEGATIVE 02/04/2024 2010   KETONESUR NEGATIVE 02/04/2024 2010   PROTEINUR 100 (A) 02/04/2024 2010   NITRITE NEGATIVE 02/04/2024 2010   LEUKOCYTESUR NEGATIVE 02/04/2024 2010    Radiological Exams on Admission: CT CHEST ABDOMEN PELVIS WO CONTRAST Result Date: 07/28/2024 EXAM: CT CHEST, ABDOMEN AND PELVIS WITHOUT CONTRAST 07/28/2024 06:43:00 PM TECHNIQUE: CT of the chest, abdomen and pelvis was performed without the administration of intravenous contrast. Multiplanar reformatted images are provided for review. Automated exposure control, iterative reconstruction, and/or weight based adjustment of the mA/kV was utilized to reduce the radiation dose to as low as reasonably achievable. COMPARISON: 01/16/2024 CLINICAL HISTORY: Epigastric/CP, nausea/vomiting, dizziness. Epigastric pain, chest pain, nausea/vomiting,  dizziness. FINDINGS: CHEST: MEDIASTINUM AND LYMPH NODES: Heart: Mild 3 vessel coronary atherosclerosis. Pericardium is unremarkable. The central airways are clear. Vessels: Thoracic aortic atherosclerosis. Right IJ dual lumen catheter terminating at the cavoatrial junction. No mediastinal, hilar or axillary lymphadenopathy. LUNGS AND PLEURA: Mild centrilobular and paraseptal emphysematous changes. Small bilateral lower lobe pulmonary nodules measuring up to 5 mm (images 82 and 89), unchanged from 2024, likely benign. No pleural effusion or pneumothorax. ABDOMEN AND  PELVIS: LIVER: The liver is unremarkable. GALLBLADDER AND BILE DUCTS: Gallbladder is unremarkable. No biliary ductal dilatation. SPLEEN: No acute abnormality. PANCREAS: Coarse parenchymal calcifications in the uncinate process, likely reflecting sequelae of prior chronic pancreatitis. ADRENAL GLANDS: No acute abnormality. KIDNEYS, URETERS AND BLADDER: Kidneys: Bilateral renal cysts, including a dominant 3.8 cm simple cyst in the left upper pole of the kidney (image 68), benign (Bosniak 1). Per consensus, no follow-up is needed for simple Bosniak type 1 and 2 renal cysts, unless the patient has a malignancy history or risk factors. No stones in the kidneys. No hydronephrosis. No perinephric stranding. No stones in the ureters. No periureteral stranding. Mildly thick-walled, trabeculated bladder, suggesting sequelae of chronic bladder outlet obstruction. GI AND BOWEL: Stomach demonstrates no acute abnormality. Sigmoid diverticulosis, without evidence of diverticulitis. There is no bowel obstruction. Normal appendix (image 87). REPRODUCTIVE ORGANS: Prostatomegaly, status post TURP. PERITONEUM AND RETROPERITONEUM: No ascites. No free air. VASCULATURE: Atherosclerotic calcifications of the abdominal aorta and branch vessels. ABDOMINAL AND PELVIS LYMPH NODES: No lymphadenopathy. BONES AND SOFT TISSUES: Mild superior endplate compression fracture deformity at  T10 and moderate superior endplate compression fracture deformity at T12 (sagittal image 100), new from prior. Approximately 40 percent loss of height at T12 with minimal retropulsion. Mild degenerative changes of the lower lumbar spine. No focal soft tissue abnormality. IMPRESSION: 1. Mild to moderate compression fractures at T10 and T12, as described above. There are new from prior and possibly acute. 2. Otherwise, not acute findings in the chest, abdomen, or pelvis. 3. Additional stable ancillary findings as above. Electronically signed by: Pinkie Pebbles MD 07/28/2024 07:15 PM EDT RP Workstation: [id number]      Assessment/Plan   Principal Problem:   Epigastric pain Active Problems:   Depression   ESRD on dialysis [facility])   Hypercalcemia   Elevated troponin   Compression fracture of T10 vertebra (HCC)   T12 compression fracture (HCC)   Chronic pancreatitis (HCC)       #) Acute epigastric pain: 2 days of persistent sharp, nonradiating epigastric discomfort, worse with palpation, and associated with recurrent nausea/vomiting associated with nonbloody, nonbilious emesis.  In the setting of a documented history of peptic ulcer disease with duodenal ulcer identified on EGD in December 2025 with interval suboptimal compliance on prescription for outpatient Protonix , differential includes potential recurrence of peptic ulcer disease, although without any evidence of acute upper gastrointestinal bleed.  Exam reveals no evidence of acute peritoneal findings.,  And CT abdomen/pelvis showed no evidence of acute intra-abdominal or acute intrapelvic process, including no evidence of acute gallbladder pathology nor any evidence of obstructing stone.  The patient is also noted to have a history of chronic pancreatitis, although his presenting acute epigastric discomfort over the last few days, appears less suggestive of an acute exacerbation thereof, noting that his lipase continues to trend down, and  appears to be the lowest value we have on file for him, as further quantified above, We will today CT abdomen/pelvis showed no evidence of acute pancreatic findings.  Today CT abdomen/pelvis showed no evidence of bowel perforation, obstruction, or abscess, and clinical presentation is less suggestive of aortic dissection.  CT abdomen/pelvis also shows incidental finding of age-indeterminate compression fracture in T10/T12 with differential for patient's presenting epigastric pain including potential referred discomfort from the interval finding of these compression fractures relative to CT abdomen/pelvis performed in April 2025.  Differential also includes presenting mild hypercalcemia, although the latter appears less likely to be a significant contributor given the very  mild elevation patient's calcium  level.  Plan: Protonix  40 mg IV twice daily.  Pepcid 20 mg p.o. daily.  Emphasized to the patient importance of improved compliance with his outpatient Protonix  prescription, particular given his history of peptic ulcer disease.  Prn IV Dilaudid, as needed IV Zofran .  Clear liquid diet.  Repeat CMP, CBC in the morning.  Check serum magnesium  level.                   #) Hypercalcemia: Mildly elevated presenting calcium  level of 10.4, notable in the context of his new onset epigastric discomfort.  Suspect contribution from outpatient calcitriol , with potential additional contribution from relative dehydration given recurrent nausea/vomiting over the course of the last 2 days, associated with very little oral intake over that timeframe.  Of note, the patient has received approximately 1.3 L of IV fluid at this point.  Will be conservative with additional IV fluids given his history of end-stage renal disease on hemodialysis.  Plan: Hold outpatient calcitriol .  Check serum magnesium  and phosphorus levels.  Will hold additional IV fluids at this time, and further trend calcium  level with repeat CMP  in the morning.  Monitor strict I's and O's and daily weights.                   #) Elevated troponin: Mildly elevated high-sensitivity troponin T of 62, with repeat value trending down to 58.  No prior high-sensitivity troponin T value available for point comparison.  Suspect contribution towards mildly elevated troponin from chronic diminished renal clearance of troponin as a consequence of end-stage renal disease.  ACS appears less likely in the absence of any recent chest pain, while EKG shows no evidence of acute ischemic changes, including no evidence of STEMI, while CT chest shows no evidence of acute cardiopulmonary process, including no evidence of pneumothorax.  Clinically, presentation appears less suggestive of acute pulmonary embolism.  Plan: Monitor on telemetry.                #) Age-indeterminate T10/T12 compression fractures.  Today CT abdomen/pelvis showed mild superior endplate compression fracture at T10 as well as moderate superior endplate compression fracture at T12, with the latter associated with 40% height loss and minimal retropulsion.  These are age-indeterminate findings, although they are reported to be new in their appearance relative to most recent prior CT imaging from April 2025.  No evidence of associated acute red flag signs.  Denies any associated new onset mid or low back discomfort.  It is noted that patient's presenting epigastric discomfort could possibly be as a consequence of referred pain from his multiple compression fractures, although I would have anticipated the preferred nature of discomfort from T10/T12 compression fractures to manifest itself in an inferior distribution of discomfort at the level of the umbilicus or lower.  Will proceed with conservative measures, as outlined below.  It is noted that these findings occurred in the absence of any recent fall or trauma.  Plan: TLSO brace has been ordered.  PT/OT consults to occur  in the morning.  Fall precautions ordered.                  #) Chronic pancreatitis: Documented history of such, with the patient on chronic Creon  supplementation.  No radiographic or laboratory evidence to suggest acute exacerbation thereof, noting that the patient's lipase continues to trend down, while CT abdomen/pelvis showed no evidence of acute pancreatic findings.   Plan: Resume outpatient Creon .                    #)  End-stage renal disease on hemodialysis: On Tuesday, Thursday, Saturday schedule, with most recently completed hemodialysis session occurring on Thursday, 07/28/2024, with next routine hemodialysis session due on Saturday, 07/30/2024.  No clinical or laboratory evidence to need to expedite next hemodialysis session relative to this schedule.  However, his hemodialysis is unavailable at [facility], will admit the patient to [facility] for availability of hemodialysis, given unknown facets to his admission, which include duration of his hospitalization.  Outpatient medications include Sensipar  as well as calcitriol .  Will hold him calcitriol  for now in the context of presenting mild hypercalcemia, as further detailed above.  Plan: Admit to [facility], as above.  Continue Sensipar .  Hold home calcitriol  for now.  Further evaluation management of presenting hypercalcemia, as above.  Check magnesium  and phosphorus levels.  CMP in the morning.  Monitor strict I's and O's and daily weights.                      #) Depression: documented h/o such. On Zoloft  as outpatient.    Plan: Continue outpatient Zoloft .        DVT prophylaxis: SCD's   Code Status: Full code Family Communication: none Disposition Plan: Per Rounding Team Consults called: none;  Ad[facility] status: obs to [facility]     I SPENT GREATER THAN 75  MINUTES IN CLINICAL CARE TIME/MEDICAL DECISION-MAKING IN COMPLETING THIS ADMISSION.      Eva NOVAK Connor Foxworthy  DO [facility]  From 7PM - 7AM   07/29/2024, 12:09 AM

## 2024-07-30 DIAGNOSIS — R1013 Epigastric pain: Secondary | ICD-10-CM | POA: Diagnosis not present

## 2024-07-30 LAB — CBC WITH DIFFERENTIAL/PLATELET
Abs Immature Granulocytes: 0.03 K/uL (ref 0.00–0.07)
Basophils Absolute: 0.1 K/uL (ref 0.0–0.1)
Basophils Relative: 1 %
Eosinophils Absolute: 0.3 K/uL (ref 0.0–0.5)
Eosinophils Relative: 4 %
HCT: 34.6 % — ABNORMAL LOW (ref 39.0–52.0)
Hemoglobin: 11.6 g/dL — ABNORMAL LOW (ref 13.0–17.0)
Immature Granulocytes: 0 %
Lymphocytes Relative: 25 %
Lymphs Abs: 2 K/uL (ref 0.7–4.0)
MCH: 30.5 pg (ref 26.0–34.0)
MCHC: 33.5 g/dL (ref 30.0–36.0)
MCV: 91.1 fL (ref 80.0–100.0)
Monocytes Absolute: 0.8 K/uL (ref 0.1–1.0)
Monocytes Relative: 10 %
Neutro Abs: 4.7 K/uL (ref 1.7–7.7)
Neutrophils Relative %: 60 %
Platelets: 204 K/uL (ref 150–400)
RBC: 3.8 MIL/uL — ABNORMAL LOW (ref 4.22–5.81)
RDW: 14.6 % (ref 11.5–15.5)
WBC: 7.9 K/uL (ref 4.0–10.5)
nRBC: 0 % (ref 0.0–0.2)

## 2024-07-30 LAB — RENAL FUNCTION PANEL
Albumin: 3.4 g/dL — ABNORMAL LOW (ref 3.5–5.0)
Anion gap: 12 (ref 5–15)
BUN: 22 mg/dL (ref 8–23)
CO2: 24 mmol/L (ref 22–32)
Calcium: 9.1 mg/dL (ref 8.9–10.3)
Chloride: 100 mmol/L (ref 98–111)
Creatinine, Ser: 5.08 mg/dL — ABNORMAL HIGH (ref 0.61–1.24)
GFR, Estimated: 11 mL/min — ABNORMAL LOW (ref >60)
Glucose, Bld: 98 mg/dL (ref 70–99)
Phosphorus: 3.2 mg/dL (ref 2.5–4.6)
Potassium: 3.4 mmol/L — ABNORMAL LOW (ref 3.5–5.1)
Sodium: 136 mmol/L (ref 135–145)

## 2024-07-30 LAB — HEPATITIS B SURFACE ANTIBODY, QUANTITATIVE: Hep B S AB Quant (Post): 119 m[IU]/mL

## 2024-07-30 MED ORDER — HEPARIN SODIUM (PORCINE) 1000 UNIT/ML IJ SOLN
INTRAMUSCULAR | Status: AC
  Filled 2024-07-30: qty 5

## 2024-07-30 MED ORDER — ALTEPLASE 2 MG IJ SOLR
INTRAMUSCULAR | Status: AC
  Filled 2024-07-30: qty 2

## 2024-07-30 NOTE — Progress Notes (Signed)
 Pt back on unit from Hemodialysis. Pt alert and oriented X4, in no apparent distress. Tele reconnected, and vital signs obtained.

## 2024-07-30 NOTE — Progress Notes (Signed)
 [facility] Progress Note   Subjective:   Patient seen and examined at bedside in dialysis.  Catheter not working this AM, currently getting cath flow dwell.  Reports feeling better.  Abdominal pain resolved and tolerating diet.  States he wants to make sure everything is ok before he gets discharged.  Denies pain in back.  Surprised to find out he had fracture vertebrae.    Objective Vitals:   07/30/24 0930 07/30/24 1000 07/30/24 1013 07/30/24 1030  BP: 125/83 124/78 118/69 128/76  Pulse: 82 85 79 84  Resp: (!) 21 17 17 17   Temp:      TempSrc:      SpO2: 97% 98% 98% 100%  Weight:      Height:       Physical Exam General:alert male in NAD Heart:RRR Lungs:CTAB, nml WOB on RA Abdomen:soft, NTND Extremities:no LE edema Dialysis Access: [facility]   Filed Weights   07/29/24 1621 07/30/24 0635 07/30/24 0748  Weight: 93.2 kg 86.5 kg 95.2 kg    Intake/Output Summary (Last 24 hours) at 07/30/2024 1108 Last data filed at 07/29/2024 2000 Gross per 24 hour  Intake 515.8 ml  Output --  Net 515.8 ml    Additional Objective Labs: Basic Metabolic Panel: Recent Labs  Lab 07/28/24 1622 07/29/24 0500  NA 138 137  K 3.8 3.6  CL 93* 95*  CO2 26 29  GLUCOSE 106* 93  BUN 21 27*  CREATININE 5.07* 6.04*  CALCIUM  10.4* 9.8  PHOS  --  4.0   Liver Function Tests: Recent Labs  Lab 07/28/24 1622 07/29/24 0500  AST 18 14*  ALT 7 8  ALKPHOS 87 74  BILITOT 1.0 0.8  PROT 7.9 6.6  ALBUMIN  4.6 4.1   Recent Labs  Lab 07/28/24 1622  LIPASE 79*   CBC: Recent Labs  Lab 07/28/24 1622 07/29/24 0500  WBC 8.5 7.5  NEUTROABS 5.5 4.4  HGB 13.4 12.4*  HCT 41.8 38.0*  MCV 90.9 92.5  PLT 226 196   Blood Culture    Component Value Date/Time   SDES CATH TIP 02/06/2024 1213   SPECREQUEST Normal 02/06/2024 1213   CULT  02/06/2024 1213    NO GROWTH 2 DAYS Performed at [facility], [address], [place], [place] [address]    REPTSTATUS 02/09/2024 FINAL  02/06/2024 1213    Studies/Results: ECHOCARDIOGRAM COMPLETE Result Date: 07/29/2024    ECHOCARDIOGRAM REPORT   Patient Name:   Manuel Ortega Date of Exam: 07/29/2024 Medical Rec #:  [id number]      Height:       73.0 in Accession #:    [id number]     Weight:       195.0 lb Date of Birth:  May 10, 1951       BSA:          2.129 m Patient Age:    73 years       BP:           113/60 mmHg Patient Gender: M              HR:           89 bpm. Exam Location:  Inpatient Procedure: 2D Echo, Cardiac Doppler and Color Doppler (Both Spectral and Color            Flow Doppler were utilized during procedure). Indications:    Dyspnea R06.00  History:        Patient has prior history of Echocardiogram examinations, most  recent 06/25/2023. Risk Factors:Hypertension.  Sonographer:    Jayson Gaskins Referring Phys: [id number] OMAIR LATIF [facility] IMPRESSIONS  1. Left ventricular ejection fraction, by estimation, is 65 to 70%. The left ventricle has hyperdynamic function. The left ventricle has no regional wall motion abnormalities. Indeterminate diastolic filling due to E-A fusion.  2. Right ventricular systolic function is normal. The right ventricular size is normal.  3. The mitral valve is grossly normal. Trivial mitral valve regurgitation.  4. The aortic valve was not well visualized. Aortic valve regurgitation is mild.  5. Cannot exclude a small PFO. Comparison(s): A prior study was performed on 06/25/2023. The ejection fraction was 54% by 3D assessment with mild LVH of the septal segment. No other significant changes. FINDINGS  Left Ventricle: Left ventricular ejection fraction, by estimation, is 65 to 70%. The left ventricle has hyperdynamic function. The left ventricle has no regional wall motion abnormalities. The left ventricular internal cavity size was normal in size. There is borderline left ventricular hypertrophy. Indeterminate diastolic filling due to E-A fusion. Right Ventricle: The right ventricular size  is normal. No increase in right ventricular wall thickness. Right ventricular systolic function is normal. Left Atrium: Left atrial size was normal in size. Right Atrium: Right atrial size was normal in size. Pericardium: There is no evidence of pericardial effusion. Mitral Valve: The mitral valve is grossly normal. Trivial mitral valve regurgitation. Tricuspid Valve: The tricuspid valve is grossly normal. Tricuspid valve regurgitation is trivial. Aortic Valve: The aortic valve was not well visualized. Aortic valve regurgitation is mild. Aortic regurgitation PHT measures 575 msec. Aortic valve mean gradient measures 6.5 mmHg. Aortic valve peak gradient measures 11.6 mmHg. Aortic valve area, by VTI  measures 1.78 cm. Pulmonic Valve: The pulmonic valve was grossly normal. Pulmonic valve regurgitation is trivial. Aorta: The aortic root is normal in size and structure. Venous: The inferior vena cava was not well visualized. IAS/Shunts: Cannot exclude a small PFO.  LEFT VENTRICLE PLAX 2D LVIDd:         3.60 cm   Diastology LVIDs:         2.30 cm   LV e' medial:    7.29 cm/s LV PW:         1.00 cm   LV E/e' medial:  11.5 LV IVS:        1.00 cm   LV e' lateral:   9.14 cm/s LVOT diam:     1.90 cm   LV E/e' lateral: 9.1 LV SV:         52 LV SV Index:   25 LVOT Area:     2.84 cm  RIGHT VENTRICLE RV S prime:     17.70 cm/s TAPSE (M-mode): 2.4 cm LEFT ATRIUM             Index        RIGHT ATRIUM           Index LA Vol (A2C):   29.0 ml 13.62 ml/m  RA Area:     15.30 cm LA Vol (A4C):   42.2 ml 19.83 ml/m  RA Volume:   34.70 ml  16.30 ml/m LA Biplane Vol: 36.3 ml 17.05 ml/m  AORTIC VALVE AV Area (Vmax):    1.62 cm AV Area (Vmean):   1.81 cm AV Area (VTI):     1.78 cm AV Vmax:           170.00 cm/s AV Vmean:          123.000 cm/s AV  VTI:            0.295 m AV Peak Grad:      11.6 mmHg AV Mean Grad:      6.5 mmHg LVOT Vmax:         97.30 cm/s LVOT Vmean:        78.700 cm/s LVOT VTI:          0.185 m LVOT/AV VTI ratio: 0.63  AI PHT:            575 msec MITRAL VALVE                TRICUSPID VALVE MV Area (PHT): 3.89 cm     TR Peak grad:   20.2 mmHg MV Decel Time: 195 msec     TR Vmax:        225.00 cm/s MV E velocity: 83.60 cm/s MV A velocity: 111.00 cm/s  SHUNTS MV E/A ratio:  0.75         Systemic VTI:  0.18 m                             Systemic Diam: 1.90 cm Emeline Calender Electronically signed by Emeline Calender Signature Date/Time: 07/29/2024/4:42:59 PM    Final    CT CHEST ABDOMEN PELVIS WO CONTRAST Result Date: 07/28/2024 EXAM: CT CHEST, ABDOMEN AND PELVIS WITHOUT CONTRAST 07/28/2024 06:43:00 PM TECHNIQUE: CT of the chest, abdomen and pelvis was performed without the administration of intravenous contrast. Multiplanar reformatted images are provided for review. Automated exposure control, iterative reconstruction, and/or weight based adjustment of the mA/kV was utilized to reduce the radiation dose to as low as reasonably achievable. COMPARISON: 01/16/2024 CLINICAL HISTORY: Epigastric/CP, nausea/vomiting, dizziness. Epigastric pain, chest pain, nausea/vomiting, dizziness. FINDINGS: CHEST: MEDIASTINUM AND LYMPH NODES: Heart: Mild 3 vessel coronary atherosclerosis. Pericardium is unremarkable. The central airways are clear. Vessels: Thoracic aortic atherosclerosis. Right IJ dual lumen catheter terminating at the cavoatrial junction. No mediastinal, hilar or axillary lymphadenopathy. LUNGS AND PLEURA: Mild centrilobular and paraseptal emphysematous changes. Small bilateral lower lobe pulmonary nodules measuring up to 5 mm (images 82 and 89), unchanged from 2024, likely benign. No pleural effusion or pneumothorax. ABDOMEN AND PELVIS: LIVER: The liver is unremarkable. GALLBLADDER AND BILE DUCTS: Gallbladder is unremarkable. No biliary ductal dilatation. SPLEEN: No acute abnormality. PANCREAS: Coarse parenchymal calcifications in the uncinate process, likely reflecting sequelae of prior chronic pancreatitis. ADRENAL GLANDS: No acute  abnormality. KIDNEYS, URETERS AND BLADDER: Kidneys: Bilateral renal cysts, including a dominant 3.8 cm simple cyst in the left upper pole of the kidney (image 68), benign (Bosniak 1). Per consensus, no follow-up is needed for simple Bosniak type 1 and 2 renal cysts, unless the patient has a malignancy history or risk factors. No stones in the kidneys. No hydronephrosis. No perinephric stranding. No stones in the ureters. No periureteral stranding. Mildly thick-walled, trabeculated bladder, suggesting sequelae of chronic bladder outlet obstruction. GI AND BOWEL: Stomach demonstrates no acute abnormality. Sigmoid diverticulosis, without evidence of diverticulitis. There is no bowel obstruction. Normal appendix (image 87). REPRODUCTIVE ORGANS: Prostatomegaly, status post TURP. PERITONEUM AND RETROPERITONEUM: No ascites. No free air. VASCULATURE: Atherosclerotic calcifications of the abdominal aorta and branch vessels. ABDOMINAL AND PELVIS LYMPH NODES: No lymphadenopathy. BONES AND SOFT TISSUES: Mild superior endplate compression fracture deformity at T10 and moderate superior endplate compression fracture deformity at T12 (sagittal image 100), new from prior. Approximately 40 percent loss of height at T12 with minimal retropulsion. Mild degenerative  changes of the lower lumbar spine. No focal soft tissue abnormality. IMPRESSION: 1. Mild to moderate compression fractures at T10 and T12, as described above. There are new from prior and possibly acute. 2. Otherwise, not acute findings in the chest, abdomen, or pelvis. 3. Additional stable ancillary findings as above. Electronically signed by: Sriyesh Krishnan MD 07/28/2024 07:15 PM EDT RP Workstation: [id number]    Medications:  anticoagulant sodium citrate      sodium chloride       Chlorhexidine  Gluconate Cloth  6 each Topical Q0600   cinacalcet   30 mg Oral Q T,Th,Sa-HD   famotidine  20 mg Oral Daily   heparin   3,500 Units Dialysis Once in dialysis    latanoprost   1 drop Both Eyes QHS   lipase/protease/amylase  72,000 Units Oral TID WC   pantoprazole  (PROTONIX ) IV  40 mg Intravenous Q12H   sertraline   100 mg Oral Daily   sevelamer  carbonate  1,600 mg Oral TID WC   timolol   1 drop Both Eyes q morning    Dialysis Orders: TTS NW 4h  B400  89.5kg   3K bath  RIJ TDC  Heparin  5000 Last OP HD 10/16, post wt 89kg Usually gets to the dry wt Dry wt lowered recently Goes to all sessions, signs off 15-30 min early 1/2 the time       Assessment/ Plan: Abdominal pain w/vomiting: Better. CT abdomen with no acute findings in the abdomen. H/o chronic pancreatitis and PUD. Per pmd.   Lightheaded/near syncope - resolved per patient.  ECHO completed showing improvement in EF to 65-70% and no other significant changes from prior.  T10/T12 compression fracture - seen on CT. Denies pain.  Does not want back brace. Per PMD.  ESRD: on HD TTS. No missed HD. HD today per regular schedule. HTN/vol:BP in goal.  Not on medication.  Got 2 L LR on admission. Does not appear overloaded.  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 Secondary hyperparathyroidism - Calcium  and phos in goal.  Continue home meds.  Not on VDRA.  Nutrition - on regular diet. Monitor labs.   Manuelita Labella, PA-C [facility] 07/30/2024,11:08 AM  LOS: 0 days

## 2024-07-30 NOTE — Plan of Care (Signed)

## 2024-07-30 NOTE — Progress Notes (Signed)
 OT Cancellation Note  Patient Details Name: Manuel Ortega MRN: [id number] DOB: 18-Mar-1951   Cancelled Treatment:    Reason Eval/Treat Not Completed: Patient at procedure or test/ unavailable. At HD. Will return as schedule allows.   [name], OTR/L [facility] Acute Rehabilitation Office: [phone]   [name] 07/30/2024, 8:24 AM

## 2024-07-30 NOTE — Progress Notes (Signed)
 Cathflo instilled in both dialysis catheter ports as per Md order.

## 2024-07-30 NOTE — Progress Notes (Signed)
 PT Cancellation Note  Patient Details Name: Manuel Ortega MRN: [id number] DOB: 12-12-1950   Cancelled Treatment:    Reason Eval/Treat Not Completed: Patient at procedure or test/unavailable (Pt still in HD.  Will see pt tomorrow.)   Stephane JULIANNA Bevel 07/30/2024, 2:08 PM Wilbur Oakland M,PT Acute Rehab Services [phone]

## 2024-07-30 NOTE — Progress Notes (Signed)
 PT Cancellation Note  Patient Details Name: Manuel Ortega MRN: [id number] DOB: [birth year]   Cancelled Treatment:    Reason Eval/Treat Not Completed: Patient at procedure or test/unavailable (Pt in HD. Will return to evaluate pt as able.)   Stephane JULIANNA Bevel 07/30/2024, 9:07 AM Kameshia Madruga M,PT Acute Rehab Services [phone]

## 2024-07-30 NOTE — Progress Notes (Signed)
 0 ultrafiltration, condition stable post hemodialysis and report was given to the primary RN.

## 2024-07-30 NOTE — Progress Notes (Signed)
 PT Cancellation Note  Patient Details Name: Stephfon Bovey MRN: [id number] DOB: [birth year]   Cancelled Treatment:    Reason Eval/Treat Not Completed: Patient at procedure or test/unavailable (Pt still in HD. Will return as able.)   Stephane JULIANNA Bevel 07/30/2024, 12:00 PM Elfrida Pixley M,PT Acute Rehab Services [phone]

## 2024-07-30 NOTE — Progress Notes (Signed)
 PROGRESS NOTE    Manuel Ortega  FMW:[id number] DOB: [birth year] DOA: 07/28/2024 PCP: Lorren Greig PARAS, NP  Outpatient Specialists:     Brief Narrative:  The patient is a 73 year old African-American male with a past medical history significant for peptic ulcer disease, chronic pancreatitis ESRD on HD on Tuesday Thursday Saturday.  Patient presented to the hospital with epigastric pain, associated with recurrent nausea and 3-4 daily episodes of nonbloody nonbilious emesis.  Additionally, patient endorsed dyspnea and lightheadness with him feeling like he was going to pass out. CT of the chest/abdomen/pelvis were done and showed mild to moderate compression fractures at T10 and T12 which were new from prior and possibly acute but an otherwise, not acute findings in the chest, abdomen, or pelvis.  Patient was placed on IV PPI twice daily as well as famotidine and given antiemetics and pain medications.  Patient was transferred to [facility] given his ESRD and need for hemodialysis TThSat.   07/30/2024: Patient seen.  Abdominal pain has improved.  Lightheadedness has resolved.  Patient is not keen on being discharged back home today.   Assessment & Plan:   Principal Problem:   Epigastric pain Active Problems:   Depression   ESRD on dialysis (HCC)   Hypercalcemia   Elevated troponin   Compression fracture of T10 vertebra (HCC)   T12 compression fracture (HCC)   Chronic pancreatitis (HCC)   Abdominal pain   Abdominal Epigastric Pain associated w/ Vomiting:  CLD being advanced to FLD to Regular Diet, Supportive Care. IV PPI and Pepcid. Antiemetics. Gentle IVF w/ LR @ 40 mL/hr (Already s/p 1 L of LR bolus). If not improving or worsening consider GI Evaluation (sees GI at [facility]). Also s/p Maalox/Myland + Lidocain Viscous. Pain control and Analgesia w/ Hydromorphone 0.5 mg po q2hprn and Acetaminophen  650 mg po q6hprn Mild Pain 07/30/2024: Continue IV Protonix .  Likely discharge back home within  the next 24 hours.   Dyspnea: Check BNP and ECHO. CT Chest/Abd/Pelvis showed no findings in the chest. Not wearing supplemental O2. Check Ambulatory Home O2 Screen.  07/30/2024: Resolved significantly.   Lightheadedness/Near Syncope: Checking ECHO. Troponin Mildly elevated but flat and trended down. PT/OT. Gentle IVF Hydration as above. Check Orthostatic VS; Monitor on Tele for any Arrhythmias  07/30/2024: Resolved.   ESRD on HD TThSat: BUN/Cr Trend: Last Labs       Recent Labs  Lab 07/28/24 1622 07/29/24 0500 07/30/24 1635  BUN 21 27* 22  CREATININE 5.07* 6.04* 5.08*    -Gentle IVF as Above. C/w Cinacalcet  30 mg TThSat and Sevelamer  Carbonate 1600 mg po TID -Avoid Nephrotoxic Medications, Contrast Dyes, Hypotension and Dehydration to Ensure Adequate Renal Perfusion and will need to Renally Adjust Meds -Continue to Monitor and Trend Renal Function carefully and repeat CMP in the AM  -Notify Nephrology for Non-Urgent Consult; Patient still urinates some  07/30/2024: Patient underwent hemodialysis today.  Further care as per nephrology team.   Chronic Pancreatitis: Lipase Level was 79. C/w Lipase/Protease/Amylase 36,000-72,000 units as indicated. Diet Advanced as above but if Abd Pain and Sx reoccur will need to cut Diet back.    Hypercalcemia: Calcium  Level Trend:  Last Labs       Recent Labs  Lab 07/28/24 1622 07/29/24 0500 07/30/24 1635  CALCIUM  10.4* 9.8 9.1    -CTM and Trend and repeat CMP in the AM 07/30/2024: Resolved.  Calcium  of 9.1.   T10/T12 Compression Fx:  Asymptomatic. Noted on CT Chest/Abd/Pelvis and noted to be Mild to  Moderate and new and possibly acute. TSLO Brace, PT/OT Consults; Pain Control as above if needed. If becomes symptomatic and painful will need MRI of Backand Consideration for Kyphoplasty  07/30/2024: Patient remains asymptomatic.   Normocytic Anemia/Anemia of Chronic Kidney Disease:  Last Labs       Recent Labs  Lab 07/28/24 1622  07/29/24 0500 07/30/24 1635  HGB 13.4 12.4* 11.6*  HCT 41.8 38.0* 34.6*  MCV 90.9 92.5 91.1    -Check Anemia Panel in the AM. CTM for S/Sx of Bleeding; No overt bleeding noted. Repeat CBC in the AM 07/30/2024: Hemoglobin of 11.6 g/dL.  Hemoglobin level is at goal for ESRD.   Depression and Anxiety: C/w Sertraline  100 mg po Daily    Glaucoma: C/w Latanoprost  0.005% Ophthalmic Solution qHS and Timolol  0.5% 1 drop Both Eyes every Morning    Overweight: Complicates overall prognosis and care. Estimated body mass index is 27.69 kg/m as calculated from the following:   Height as of this encounter: 6' 1 (1.854 m).   Weight as of this encounter: 95.2 kg. Weight Loss and Dietary Counseling given  DVT prophylaxis:  Code Status: Full code. Family Communication:  Disposition Plan: Observation.  Likely discharge back on tomorrow.   Consultants:  Nephrology.    Procedures:  Patient underwent hemodialysis earlier today.  Antimicrobials:  None.   Subjective: Patient continues to report vague abdominal pain. Not keen on being discharged back home today.  Objective: Vitals:   07/30/24 1334 07/30/24 1339 07/30/24 1415 07/30/24 1637  BP: 117/66 112/63 127/76 100/69  Pulse: 87 84  80  Resp: 16 15 20 16   Temp:  98.5 F (36.9 C) 98.9 F (37.2 C) 99.1 F (37.3 C)  TempSrc:   Oral Oral  SpO2: 99% 98%    Weight:      Height:        Intake/Output Summary (Last 24 hours) at 07/30/2024 1900 Last data filed at 07/30/2024 1339 Gross per 24 hour  Intake 240 ml  Output 0 ml  Net 240 ml   Filed Weights   07/29/24 1621 07/30/24 0635 07/30/24 0748  Weight: 93.2 kg 86.5 kg 95.2 kg    Examination:  General exam: Appears calm and comfortable  Respiratory system: Clear to auscultation. Respiratory effort normal. Cardiovascular system: S1 & S2 heard Gastrointestinal system: Abdomen is soft, with vague abdominal discomfort. Central nervous system: Alert and oriented. Extremities: No  leg edema.  Data Reviewed: I have personally reviewed following labs and imaging studies  CBC: Recent Labs  Lab 07/28/24 1622 07/29/24 0500 07/30/24 1635  WBC 8.5 7.5 7.9  NEUTROABS 5.5 4.4 4.7  HGB 13.4 12.4* 11.6*  HCT 41.8 38.0* 34.6*  MCV 90.9 92.5 91.1  PLT 226 196 204   Basic Metabolic Panel: Recent Labs  Lab 07/28/24 1622 07/29/24 0500 07/30/24 1635  NA 138 137 136  K 3.8 3.6 3.4*  CL 93* 95* 100  CO2 26 29 24   GLUCOSE 106* 93 98  BUN 21 27* 22  CREATININE 5.07* 6.04* 5.08*  CALCIUM  10.4* 9.8 9.1  MG  --  2.1  --   PHOS  --  4.0 3.2   GFR: Estimated Creatinine Clearance: 14.9 mL/min (A) (by C-G formula based on SCr of 5.08 mg/dL (H)). Liver Function Tests: Recent Labs  Lab 07/28/24 1622 07/29/24 0500 07/30/24 1635  AST 18 14*  --   ALT 7 8  --   ALKPHOS 87 74  --   BILITOT 1.0 0.8  --  PROT 7.9 6.6  --   ALBUMIN  4.6 4.1 3.4*   Recent Labs  Lab 07/28/24 1622  LIPASE 79*   No results for input(s): AMMONIA in the last 168 hours. Coagulation Profile: No results for input(s): INR, PROTIME in the last 168 hours. Cardiac Enzymes: No results for input(s): CKTOTAL, CKMB, CKMBINDEX, TROPONINI in the last 168 hours. BNP (last 3 results) Recent Labs    07/29/24 0500  PROBNP 992.0*   HbA1C: No results for input(s): HGBA1C in the last 72 hours. CBG: Recent Labs  Lab 07/28/24 1430  GLUCAP 107*   Lipid Profile: No results for input(s): CHOL, HDL, LDLCALC, TRIG, CHOLHDL, LDLDIRECT in the last 72 hours. Thyroid Function Tests: No results for input(s): TSH, T4TOTAL, FREET4, T3FREE, THYROIDAB in the last 72 hours. Anemia Panel: No results for input(s): VITAMINB12, FOLATE, FERRITIN, TIBC, IRON , RETICCTPCT in the last 72 hours. Urine analysis:    Component Value Date/Time   COLORURINE STRAW (A) 02/04/2024 2010   APPEARANCEUR CLEAR 02/04/2024 2010   LABSPEC 1.009 02/04/2024 2010   PHURINE 8.0  02/04/2024 2010   GLUCOSEU NEGATIVE 02/04/2024 2010   HGBUR SMALL (A) 02/04/2024 2010   BILIRUBINUR NEGATIVE 02/04/2024 2010   KETONESUR NEGATIVE 02/04/2024 2010   PROTEINUR 100 (A) 02/04/2024 2010   NITRITE NEGATIVE 02/04/2024 2010   LEUKOCYTESUR NEGATIVE 02/04/2024 2010   Sepsis Labs: @LABRCNTIP (procalcitonin:4,lacticidven:4)  )No results found for this or any previous visit (from the past 240 hours).       Radiology Studies: ECHOCARDIOGRAM COMPLETE Result Date: 07/29/2024    ECHOCARDIOGRAM REPORT   Patient Name:   Manuel Ortega Date of Exam: 07/29/2024 Medical Rec #:  [id number]      Height:       73.0 in Accession #:    [id number]     Weight:       195.0 lb Date of Birth:  1950/12/18       BSA:          2.129 m Patient Age:    73 years       BP:           113/60 mmHg Patient Gender: M              HR:           89 bpm. Exam Location:  Inpatient Procedure: 2D Echo, Cardiac Doppler and Color Doppler (Both Spectral and Color            Flow Doppler were utilized during procedure). Indications:    Dyspnea R06.00  History:        Patient has prior history of Echocardiogram examinations, most                 recent 06/25/2023. Risk Factors:Hypertension.  Sonographer:    Jayson Gaskins Referring Phys: [id number] OMAIR LATIF [facility] IMPRESSIONS  1. Left ventricular ejection fraction, by estimation, is 65 to 70%. The left ventricle has hyperdynamic function. The left ventricle has no regional wall motion abnormalities. Indeterminate diastolic filling due to E-A fusion.  2. Right ventricular systolic function is normal. The right ventricular size is normal.  3. The mitral valve is grossly normal. Trivial mitral valve regurgitation.  4. The aortic valve was not well visualized. Aortic valve regurgitation is mild.  5. Cannot exclude a small PFO. Comparison(s): A prior study was performed on 06/25/2023. The ejection fraction was 54% by 3D assessment with mild LVH of the septal segment. No other significant  changes. FINDINGS  Left Ventricle: Left  ventricular ejection fraction, by estimation, is 65 to 70%. The left ventricle has hyperdynamic function. The left ventricle has no regional wall motion abnormalities. The left ventricular internal cavity size was normal in size. There is borderline left ventricular hypertrophy. Indeterminate diastolic filling due to E-A fusion. Right Ventricle: The right ventricular size is normal. No increase in right ventricular wall thickness. Right ventricular systolic function is normal. Left Atrium: Left atrial size was normal in size. Right Atrium: Right atrial size was normal in size. Pericardium: There is no evidence of pericardial effusion. Mitral Valve: The mitral valve is grossly normal. Trivial mitral valve regurgitation. Tricuspid Valve: The tricuspid valve is grossly normal. Tricuspid valve regurgitation is trivial. Aortic Valve: The aortic valve was not well visualized. Aortic valve regurgitation is mild. Aortic regurgitation PHT measures 575 msec. Aortic valve mean gradient measures 6.5 mmHg. Aortic valve peak gradient measures 11.6 mmHg. Aortic valve area, by VTI  measures 1.78 cm. Pulmonic Valve: The pulmonic valve was grossly normal. Pulmonic valve regurgitation is trivial. Aorta: The aortic root is normal in size and structure. Venous: The inferior vena cava was not well visualized. IAS/Shunts: Cannot exclude a small PFO.  LEFT VENTRICLE PLAX 2D LVIDd:         3.60 cm   Diastology LVIDs:         2.30 cm   LV e' medial:    7.29 cm/s LV PW:         1.00 cm   LV E/e' medial:  11.5 LV IVS:        1.00 cm   LV e' lateral:   9.14 cm/s LVOT diam:     1.90 cm   LV E/e' lateral: 9.1 LV SV:         52 LV SV Index:   25 LVOT Area:     2.84 cm  RIGHT VENTRICLE RV S prime:     17.70 cm/s TAPSE (M-mode): 2.4 cm LEFT ATRIUM             Index        RIGHT ATRIUM           Index LA Vol (A2C):   29.0 ml 13.62 ml/m  RA Area:     15.30 cm LA Vol (A4C):   42.2 ml 19.83 ml/m  RA Volume:    34.70 ml  16.30 ml/m LA Biplane Vol: 36.3 ml 17.05 ml/m  AORTIC VALVE AV Area (Vmax):    1.62 cm AV Area (Vmean):   1.81 cm AV Area (VTI):     1.78 cm AV Vmax:           170.00 cm/s AV Vmean:          123.000 cm/s AV VTI:            0.295 m AV Peak Grad:      11.6 mmHg AV Mean Grad:      6.5 mmHg LVOT Vmax:         97.30 cm/s LVOT Vmean:        78.700 cm/s LVOT VTI:          0.185 m LVOT/AV VTI ratio: 0.63 AI PHT:            575 msec MITRAL VALVE                TRICUSPID VALVE MV Area (PHT): 3.89 cm     TR Peak grad:   20.2 mmHg MV Decel Time: 195 msec  TR Vmax:        225.00 cm/s MV E velocity: 83.60 cm/s MV A velocity: 111.00 cm/s  SHUNTS MV E/A ratio:  0.75         Systemic VTI:  0.18 m                             Systemic Diam: 1.90 cm Emeline Calender Electronically signed by Emeline Calender Signature Date/Time: 07/29/2024/4:42:59 PM    Final         Scheduled Meds:  Chlorhexidine  Gluconate Cloth  6 each Topical Q0600   cinacalcet   30 mg Oral Q T,Th,Sa-HD   famotidine  20 mg Oral Daily   latanoprost   1 drop Both Eyes QHS   lipase/protease/amylase  72,000 Units Oral TID WC   pantoprazole  (PROTONIX ) IV  40 mg Intravenous Q12H   sertraline   100 mg Oral Daily   sevelamer  carbonate  1,600 mg Oral TID WC   timolol   1 drop Both Eyes q morning   Continuous Infusions:  sodium chloride        LOS: 0 days    Time spent: 35 minutes.    Leatrice Chapel, MD  [facility] Pager #: [phone] 7PM-7AM contact night coverage as above

## 2024-07-31 ENCOUNTER — Inpatient Hospital Stay (HOSPITAL_COMMUNITY)

## 2024-07-31 DIAGNOSIS — R7989 Other specified abnormal findings of blood chemistry: Secondary | ICD-10-CM | POA: Diagnosis present

## 2024-07-31 DIAGNOSIS — E538 Deficiency of other specified B group vitamins: Secondary | ICD-10-CM | POA: Diagnosis present

## 2024-07-31 DIAGNOSIS — E860 Dehydration: Secondary | ICD-10-CM | POA: Diagnosis present

## 2024-07-31 DIAGNOSIS — H409 Unspecified glaucoma: Secondary | ICD-10-CM | POA: Diagnosis present

## 2024-07-31 DIAGNOSIS — Z992 Dependence on renal dialysis: Secondary | ICD-10-CM | POA: Diagnosis not present

## 2024-07-31 DIAGNOSIS — N3000 Acute cystitis without hematuria: Secondary | ICD-10-CM | POA: Diagnosis not present

## 2024-07-31 DIAGNOSIS — Z79899 Other long term (current) drug therapy: Secondary | ICD-10-CM | POA: Diagnosis not present

## 2024-07-31 DIAGNOSIS — B961 Klebsiella pneumoniae [K. pneumoniae] as the cause of diseases classified elsewhere: Secondary | ICD-10-CM | POA: Diagnosis present

## 2024-07-31 DIAGNOSIS — Z8546 Personal history of malignant neoplasm of prostate: Secondary | ICD-10-CM | POA: Diagnosis not present

## 2024-07-31 DIAGNOSIS — Z6825 Body mass index (BMI) 25.0-25.9, adult: Secondary | ICD-10-CM | POA: Diagnosis not present

## 2024-07-31 DIAGNOSIS — Z823 Family history of stroke: Secondary | ICD-10-CM | POA: Diagnosis not present

## 2024-07-31 DIAGNOSIS — F32A Depression, unspecified: Secondary | ICD-10-CM | POA: Diagnosis present

## 2024-07-31 DIAGNOSIS — I700 Atherosclerosis of aorta: Secondary | ICD-10-CM | POA: Diagnosis not present

## 2024-07-31 DIAGNOSIS — I120 Hypertensive chronic kidney disease with stage 5 chronic kidney disease or end stage renal disease: Secondary | ICD-10-CM | POA: Diagnosis present

## 2024-07-31 DIAGNOSIS — R1013 Epigastric pain: Secondary | ICD-10-CM | POA: Diagnosis present

## 2024-07-31 DIAGNOSIS — M4854XA Collapsed vertebra, not elsewhere classified, thoracic region, initial encounter for fracture: Secondary | ICD-10-CM | POA: Diagnosis present

## 2024-07-31 DIAGNOSIS — N186 End stage renal disease: Secondary | ICD-10-CM | POA: Diagnosis present

## 2024-07-31 DIAGNOSIS — E663 Overweight: Secondary | ICD-10-CM | POA: Diagnosis present

## 2024-07-31 DIAGNOSIS — N390 Urinary tract infection, site not specified: Secondary | ICD-10-CM | POA: Diagnosis present

## 2024-07-31 DIAGNOSIS — F419 Anxiety disorder, unspecified: Secondary | ICD-10-CM | POA: Diagnosis present

## 2024-07-31 DIAGNOSIS — R509 Fever, unspecified: Secondary | ICD-10-CM | POA: Diagnosis not present

## 2024-07-31 DIAGNOSIS — K861 Other chronic pancreatitis: Secondary | ICD-10-CM | POA: Diagnosis present

## 2024-07-31 DIAGNOSIS — Z8711 Personal history of peptic ulcer disease: Secondary | ICD-10-CM | POA: Diagnosis not present

## 2024-07-31 DIAGNOSIS — D631 Anemia in chronic kidney disease: Secondary | ICD-10-CM | POA: Diagnosis present

## 2024-07-31 DIAGNOSIS — N2581 Secondary hyperparathyroidism of renal origin: Secondary | ICD-10-CM | POA: Diagnosis present

## 2024-07-31 DIAGNOSIS — R109 Unspecified abdominal pain: Secondary | ICD-10-CM | POA: Diagnosis not present

## 2024-07-31 DIAGNOSIS — R55 Syncope and collapse: Secondary | ICD-10-CM | POA: Diagnosis present

## 2024-07-31 LAB — URINALYSIS, COMPLETE (UACMP) WITH MICROSCOPIC
Bacteria, UA: NONE SEEN
Bilirubin Urine: NEGATIVE
Glucose, UA: NEGATIVE mg/dL
Hgb urine dipstick: NEGATIVE
Ketones, ur: NEGATIVE mg/dL
Leukocytes,Ua: NEGATIVE
Nitrite: NEGATIVE
Protein, ur: 100 mg/dL — AB
Specific Gravity, Urine: 1.009 (ref 1.005–1.030)
pH: 9 — ABNORMAL HIGH (ref 5.0–8.0)

## 2024-07-31 MED ORDER — VANCOMYCIN HCL 2000 MG/400ML IV SOLN
2000.0000 mg | Freq: Once | INTRAVENOUS | Status: AC
  Administered 2024-07-31: 2000 mg via INTRAVENOUS
  Filled 2024-07-31: qty 400

## 2024-07-31 MED ORDER — VANCOMYCIN HCL IN DEXTROSE 1-5 GM/200ML-% IV SOLN
1000.0000 mg | INTRAVENOUS | Status: DC
  Administered 2024-08-02: 1000 mg via INTRAVENOUS
  Filled 2024-07-31: qty 200

## 2024-07-31 MED ORDER — SODIUM CHLORIDE 0.9 % IV SOLN
1.0000 g | INTRAVENOUS | Status: DC
  Administered 2024-07-31 – 2024-08-01 (×2): 1 g via INTRAVENOUS
  Filled 2024-07-31 (×2): qty 1

## 2024-07-31 NOTE — Progress Notes (Signed)
 DME Necessity    Pt would benefit from [facility] at d/c, to increase safety with shower transfers and LB bathing. Pt with hx of epigastric pain, and newly discovered T10 & T12 fx. Pt will would benefit greatly from seated showers to adhere to back precautions during LB bathing, and reduce risk for falls.  Elissa Grieshop C, OT  Acute Rehabilitation Services Office [phone] Secure chat preferred

## 2024-07-31 NOTE — Progress Notes (Signed)
 Pharmacy Antibiotic Note  Manuel Ortega is a 73 y.o. male admitted on 07/28/2024 with bacteremia.  Pharmacy has been consulted for Vancomycin  and Ceftazidime dosing.  Plan: Vancomycin  2000 mg x1 followed by vancomycin  1000 mg after each dialysis session Ceftazidime 1 g Q24H  Follow Bcx  Height: 6' 1 (185.4 cm) Weight: 95.2 kg (209 lb 14.1 oz) IBW/kg (Calculated) : 79.9  Temp (24hrs), Avg:99.3 F (37.4 C), Min:97.7 F (36.5 C), Max:101.4 F (38.6 C)  Recent Labs  Lab 07/28/24 1622 07/29/24 0500 07/30/24 1635  WBC 8.5 7.5 7.9  CREATININE 5.07* 6.04* 5.08*    Estimated Creatinine Clearance: 14.9 mL/min (A) (by C-G formula based on SCr of 5.08 mg/dL (H)).    No Known Allergies  Antimicrobials this admission: 10/19 Ceftazidime >>  10/19 Vancomycin  >>   Dose adjustments this admission: Vancomycin  and Ceftazidime dosed for HD  Microbiology results: 10/19 BCx: In process  Thank you for allowing pharmacy to be a part of this patient's care.  Prentice DOROTHA Favors, PharmD PGY1 Health-System Pharmacy Administration and Leadership Resident [facility]  07/31/2024 3:23 PM

## 2024-07-31 NOTE — Evaluation (Signed)
 Occupational Therapy Evaluation & Discharge Patient Details Name: Manuel Ortega MRN: [id number] DOB: Feb 05, 1951 Today's Date: 07/31/2024   History of Present Illness   Pt is a 73 y.o. male admitted 10/16 for epigastric pain after HD. CT showed no abdominal changes, noted mild T10 & T12 compression fx. Pt asymptomatic of back pain, declined TLSO.  PMH: peptic ulcer disease, chronic pancreatitis, ESRD on HD, HTN, glaucoma     Clinical Impressions Pt admitted based on above, and was seen based on problem list below. PTA pt was independent with ADLs and IADLs. Today pt is at his functional baseline for ADLs and mobility. Pt completed standing ADLs, LB dressing, and ambulated 300 ft with no AD, ind. Educated pt on use of back precautions for comfort, and use of shower seat vs BSC to increase safety with shower transfers. Pt with good health literacy and awareness of safety with BP post-HD. No follow up therapy needs. Pt at baseline, no further acute OT needs identified, all education complete, pt verbalized understanding. OT is signing off on this pt.     If plan is discharge home, recommend the following:   Assistance with cooking/housework     Functional Status Assessment   Patient has not had a recent decline in their functional status     Equipment Recommendations   BSC/3in1;Tub/shower seat      Precautions/Restrictions   Precautions Precautions: Back Precaution/Restrictions Comments: Back for comfort Restrictions Weight Bearing Restrictions Per Provider Order: No     Mobility Bed Mobility Overal bed mobility: Independent     General bed mobility comments: Educated on log rolling for comfort if needed    Transfers Overall transfer level: Independent Equipment used: None   General transfer comment: Amb 363ft no AD      Balance Overall balance assessment: Independent       ADL either performed or assessed with clinical judgement   ADL Overall ADL's :  Independent     General ADL Comments: Pt completed standing ADLs, LB dressing, and functional transfers ind     Vision Baseline Vision/History: 1 Wears glasses Patient Visual Report: No change from baseline Vision Assessment?: No apparent visual deficits            Pertinent Vitals/Pain Pain Assessment Pain Assessment: 0-10 Pain Score: 7  Pain Location: Stomach Pain Descriptors / Indicators: Discomfort Pain Intervention(s): Monitored during session     Extremity/Trunk Assessment Upper Extremity Assessment Upper Extremity Assessment: Overall WFL for tasks assessed   Lower Extremity Assessment Lower Extremity Assessment: Overall WFL for tasks assessed   Cervical / Trunk Assessment Cervical / Trunk Assessment: Normal   Communication Communication Communication: No apparent difficulties   Cognition Arousal: Alert Behavior During Therapy: WFL for tasks assessed/performed Cognition: No apparent impairments     Following commands: Intact       Cueing  General Comments   Cueing Techniques: Verbal cues  negative for orthostatics, asymptomatic           Home Living Family/patient expects to be discharged to:: Private residence Living Arrangements: Alone Available Help at Discharge: Friend(s);Available PRN/intermittently Type of Home: House Home Access: Level entry     Home Layout: Two level;1/2 bath on main level Alternate Level Stairs-Number of Steps: Flight Alternate Level Stairs-Rails: Right;Left Bathroom Shower/Tub: Chief Strategy Officer: Standard     Home Equipment: Grab bars - tub/shower          Prior Functioning/Environment Prior Level of Function : Independent/Modified Independent  Mobility Comments: Ind, no AD ADLs Comments: Ind    OT Problem List: Pain (abdominal pain)        OT Goals(Current goals can be found in the care plan section)   Acute Rehab OT Goals Patient Stated Goal: To feel better OT Goal  Formulation: All assessment and education complete, DC therapy Time For Goal Achievement: 08/14/24 Potential to Achieve Goals: Good   AM-PAC OT 6 Clicks Daily Activity     Outcome Measure Help from another person eating meals?: None Help from another person taking care of personal grooming?: None Help from another person toileting, which includes using toliet, bedpan, or urinal?: None Help from another person bathing (including washing, rinsing, drying)?: None Help from another person to put on and taking off regular upper body clothing?: None Help from another person to put on and taking off regular lower body clothing?: None 6 Click Score: 24   End of Session Nurse Communication: Mobility status  Activity Tolerance: Patient tolerated treatment well Patient left: with nursing/sitter in room;with call bell/phone within reach  OT Visit Diagnosis: Other (comment) (Epigastric pain)                Time: 9263-9191 OT Time Calculation (min): 32 min Charges:  OT General Charges $OT Visit: 1 Visit OT Evaluation $OT Eval Low Complexity: 1 Low OT Treatments $Self Care/Home Management : 8-22 mins  Adrianne BROCKS, OT  Acute Rehabilitation Services Office [phone] Secure chat preferred   Adrianne GORMAN Savers 07/31/2024, 8:18 AM

## 2024-07-31 NOTE — Progress Notes (Signed)
 [facility] Progress Note   Subjective:   Patient seen and examined at bedside.  Reports lots of gas this morning. Mostly belching.  Did have 1 loose BM.  Abdominal pain a little better. No nausea/vomiting. Tolerating diet.  TDC worked well yesterday following cath flo dwell.    Objective Vitals:   07/31/24 0032 07/31/24 0422 07/31/24 0500 07/31/24 0756  BP: 98/61 123/65  114/82  Pulse: 85 74  94  Resp: 16 18  13   Temp: 98.2 F (36.8 C) 98.3 F (36.8 C)  97.7 F (36.5 C)  TempSrc: Oral Oral  Oral  SpO2: 100%   98%  Weight:   95.2 kg   Height:       Physical Exam General:well appearing male in NAD Heart:RRR Lungs:nml WOB on RA Abdomen:non distended Extremities:no LE edema Dialysis Access: [facility]   Filed Weights   07/30/24 0635 07/30/24 0748 07/31/24 0500  Weight: 86.5 kg 95.2 kg 95.2 kg    Intake/Output Summary (Last 24 hours) at 07/31/2024 1023 Last data filed at 07/30/2024 2000 Gross per 24 hour  Intake 120 ml  Output 0 ml  Net 120 ml    Additional Objective Labs: Basic Metabolic Panel: Recent Labs  Lab 07/28/24 1622 07/29/24 0500 07/30/24 1635  NA 138 137 136  K 3.8 3.6 3.4*  CL 93* 95* 100  CO2 26 29 24   GLUCOSE 106* 93 98  BUN 21 27* 22  CREATININE 5.07* 6.04* 5.08*  CALCIUM  10.4* 9.8 9.1  PHOS  --  4.0 3.2   Liver Function Tests: Recent Labs  Lab 07/28/24 1622 07/29/24 0500 07/30/24 1635  AST 18 14*  --   ALT 7 8  --   ALKPHOS 87 74  --   BILITOT 1.0 0.8  --   PROT 7.9 6.6  --   ALBUMIN  4.6 4.1 3.4*   Recent Labs  Lab 07/28/24 1622  LIPASE 79*   CBC: Recent Labs  Lab 07/28/24 1622 07/29/24 0500 07/30/24 1635  WBC 8.5 7.5 7.9  NEUTROABS 5.5 4.4 4.7  HGB 13.4 12.4* 11.6*  HCT 41.8 38.0* 34.6*  MCV 90.9 92.5 91.1  PLT 226 196 204   Blood Culture    Component Value Date/Time   SDES CATH TIP 02/06/2024 1213   SPECREQUEST Normal 02/06/2024 1213   CULT  02/06/2024 1213    NO GROWTH 2 DAYS Performed at [facility], [address], [place], [place] [address]    REPTSTATUS 02/09/2024 FINAL 02/06/2024 1213    Studies/Results: ECHOCARDIOGRAM COMPLETE Result Date: 07/29/2024    ECHOCARDIOGRAM REPORT   Patient Name:   HJALMER IOVINO Date of Exam: 07/29/2024 Medical Rec #:  [id number]      Height:       73.0 in Accession #:    [id number]     Weight:       195.0 lb Date of Birth:  [birth year]       BSA:          2.129 m Patient Age:    73 years       BP:           113/60 mmHg Patient Gender: M              HR:           89 bpm. Exam Location:  Inpatient Procedure: 2D Echo, Cardiac Doppler and Color Doppler (Both Spectral and Color  Flow Doppler were utilized during procedure). Indications:    Dyspnea R06.00  History:        Patient has prior history of Echocardiogram examinations, most                 recent 06/25/2023. Risk Factors:Hypertension.  Sonographer:    Jayson Gaskins Referring Phys: [id number] OMAIR LATIF [facility] IMPRESSIONS  1. Left ventricular ejection fraction, by estimation, is 65 to 70%. The left ventricle has hyperdynamic function. The left ventricle has no regional wall motion abnormalities. Indeterminate diastolic filling due to E-A fusion.  2. Right ventricular systolic function is normal. The right ventricular size is normal.  3. The mitral valve is grossly normal. Trivial mitral valve regurgitation.  4. The aortic valve was not well visualized. Aortic valve regurgitation is mild.  5. Cannot exclude a small PFO. Comparison(s): A prior study was performed on 06/25/2023. The ejection fraction was 54% by 3D assessment with mild LVH of the septal segment. No other significant changes. FINDINGS  Left Ventricle: Left ventricular ejection fraction, by estimation, is 65 to 70%. The left ventricle has hyperdynamic function. The left ventricle has no regional wall motion abnormalities. The left ventricular internal cavity size was normal in size. There is borderline left ventricular hypertrophy.  Indeterminate diastolic filling due to E-A fusion. Right Ventricle: The right ventricular size is normal. No increase in right ventricular wall thickness. Right ventricular systolic function is normal. Left Atrium: Left atrial size was normal in size. Right Atrium: Right atrial size was normal in size. Pericardium: There is no evidence of pericardial effusion. Mitral Valve: The mitral valve is grossly normal. Trivial mitral valve regurgitation. Tricuspid Valve: The tricuspid valve is grossly normal. Tricuspid valve regurgitation is trivial. Aortic Valve: The aortic valve was not well visualized. Aortic valve regurgitation is mild. Aortic regurgitation PHT measures 575 msec. Aortic valve mean gradient measures 6.5 mmHg. Aortic valve peak gradient measures 11.6 mmHg. Aortic valve area, by VTI  measures 1.78 cm. Pulmonic Valve: The pulmonic valve was grossly normal. Pulmonic valve regurgitation is trivial. Aorta: The aortic root is normal in size and structure. Venous: The inferior vena cava was not well visualized. IAS/Shunts: Cannot exclude a small PFO.  LEFT VENTRICLE PLAX 2D LVIDd:         3.60 cm   Diastology LVIDs:         2.30 cm   LV e' medial:    7.29 cm/s LV PW:         1.00 cm   LV E/e' medial:  11.5 LV IVS:        1.00 cm   LV e' lateral:   9.14 cm/s LVOT diam:     1.90 cm   LV E/e' lateral: 9.1 LV SV:         52 LV SV Index:   25 LVOT Area:     2.84 cm  RIGHT VENTRICLE RV S prime:     17.70 cm/s TAPSE (M-mode): 2.4 cm LEFT ATRIUM             Index        RIGHT ATRIUM           Index LA Vol (A2C):   29.0 ml 13.62 ml/m  RA Area:     15.30 cm LA Vol (A4C):   42.2 ml 19.83 ml/m  RA Volume:   34.70 ml  16.30 ml/m LA Biplane Vol: 36.3 ml 17.05 ml/m  AORTIC VALVE AV Area (Vmax):    1.62  cm AV Area (Vmean):   1.81 cm AV Area (VTI):     1.78 cm AV Vmax:           170.00 cm/s AV Vmean:          123.000 cm/s AV VTI:            0.295 m AV Peak Grad:      11.6 mmHg AV Mean Grad:      6.5 mmHg LVOT Vmax:          97.30 cm/s LVOT Vmean:        78.700 cm/s LVOT VTI:          0.185 m LVOT/AV VTI ratio: 0.63 AI PHT:            575 msec MITRAL VALVE                TRICUSPID VALVE MV Area (PHT): 3.89 cm     TR Peak grad:   20.2 mmHg MV Decel Time: 195 msec     TR Vmax:        225.00 cm/s MV E velocity: 83.60 cm/s MV A velocity: 111.00 cm/s  SHUNTS MV E/A ratio:  0.75         Systemic VTI:  0.18 m                             Systemic Diam: 1.90 cm Emeline Calender Electronically signed by Emeline Calender Signature Date/Time: 07/29/2024/4:42:59 PM    Final     Medications:  sodium chloride       Chlorhexidine  Gluconate Cloth  6 each Topical Q0600   cinacalcet   30 mg Oral Q T,Th,Sa-HD   famotidine  20 mg Oral Daily   latanoprost   1 drop Both Eyes QHS   lipase/protease/amylase  72,000 Units Oral TID WC   pantoprazole  (PROTONIX ) IV  40 mg Intravenous Q12H   sertraline   100 mg Oral Daily   sevelamer  carbonate  1,600 mg Oral TID WC   timolol   1 drop Both Eyes q morning    Dialysis Orders: TTS NW 4h  B400  89.5kg   3K bath  RIJ TDC  Heparin  5000 Last OP HD 10/16, post wt 89kg Usually gets to the dry wt Dry wt lowered recently Goes to all sessions, signs off 15-30 min early 1/2 the time      Assessment/ Plan: Abdominal pain w/vomiting: Improving.  CT abdomen with no acute findings. H/o chronic pancreatitis and PUD. Per pmd.   Lightheaded/near syncope - resolved per patient.  ECHO completed showing improvement in EF to 65-70% and no other significant changes from prior.  T10/T12 compression fracture - seen on CT. Denies pain.  Does not want back brace. Per PMD.  ESRD: on HD TTS. No missed HD. Next HD on 08/02/24.  HTN/vol:BP in goal.  Not on medication.  Got 2 L LR on admission. Does not appear overloaded.  UF as tolerated.  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 Secondary hyperparathyroidism - Calcium  and phos in goal.  Continue home meds.  Not on VDRA.  Nutrition - on regular diet. Monitor  labs.  Manuelita Labella, PA-C [facility] 07/31/2024,10:23 AM  LOS: 0 days

## 2024-07-31 NOTE — Progress Notes (Signed)
 PT Cancellation Note  Patient Details Name: Arshia Rondon MRN: [id number] DOB: August 23, 1951   Cancelled Treatment:    Reason Eval/Treat Not Completed: PT screened, no needs identified, will sign off (OT reports that pt is ind. Will screen at this time. Please re-consult if further needs arise.)  Dorothyann Maier, DPT, CLT  Acute Rehabilitation Services Office: [phone] (Secure chat preferred)   Dorothyann VEAR Maier 07/31/2024, 9:28 AM

## 2024-07-31 NOTE — TOC Progression Note (Signed)
 Transition of Care [facility]) - Progression Note    Patient Details  Name: Lateef Juncaj MRN: [id number] Date of Birth: [birth year]  [facility] CM/SW Contact  Robynn Eileen Hoose, RN Phone Number: 07/31/2024, 8:36 AM  Clinical Narrative:   Secure message from OT regarding patient needing BSC.  BSC order placed to [employer] through Epic Portal.                      Expected Discharge Plan and Services                                               Social Drivers of Health (SDOH) Interventions SDOH Screenings   Food Insecurity: No Food Insecurity (07/29/2024)  Housing: Low Risk  (07/29/2024)  Transportation Needs: No Transportation Needs (07/29/2024)  Utilities: Not At Risk (07/29/2024)  Alcohol Screen: Low Risk  (01/22/2024)  Depression (PHQ2-9): Medium Risk (03/08/2024)  Financial Resource Strain: Low Risk  (11/10/2023)   Received from [facility]  Physical Activity: Sufficiently Active (08/21/2022)   Received from [facility]  Social Connections: Socially Isolated (07/29/2024)  Stress: No Stress Concern Present (02/02/2024)   Received from [facility]  Tobacco Use: Low Risk  (07/29/2024)  Health Literacy: Adequate Health Literacy (01/22/2024)    Readmission Risk Interventions    01/18/2024    4:03 PM  Readmission Risk Prevention Plan  Transportation Screening Complete  PCP or Specialist Appt within 5-7 Days Complete  Home Care Screening Complete  Medication Review (RN CM) Referral to Pharmacy

## 2024-07-31 NOTE — Progress Notes (Signed)
 PROGRESS NOTE    Manuel Ortega  FMW:[id number] DOB: 1951-04-01 DOA: 07/28/2024 PCP: Lorren Greig PARAS, NP  Outpatient Specialists:     Brief Narrative:  The patient is a 73 year old African-American male with a past medical history significant for peptic ulcer disease, chronic pancreatitis ESRD on HD on Tuesday Thursday Saturday.  Patient presented to the hospital with epigastric pain, associated with recurrent nausea and 3-4 daily episodes of nonbloody nonbilious emesis.  Additionally, patient endorsed dyspnea and lightheadness with him feeling like he was going to pass out. CT of the chest/abdomen/pelvis were done and showed mild to moderate compression fractures at T10 and T12 which were new from prior and possibly acute but an otherwise, not acute findings in the chest, abdomen, or pelvis.  Patient was placed on IV PPI twice daily as well as famotidine and given antiemetics and pain medications.  Patient was transferred to [facility] given his ESRD and need for hemodialysis TThSat.   07/30/2024: Patient seen.  Abdominal pain has improved.  Lightheadedness has resolved.  Patient is not keen on being discharged back home today.  07/31/2024: Patient seen.  Patient developed fever today.  Will panculture patient.  Will start patient on IV vancomycin /ceftazidime.  Discussed with nephrology team.  Low threshold to consult infectious disease team.  Patient has a tunneled dialysis catheter.  Check CBC and renal panel in AM.   Assessment & Plan:   Principal Problem:   Epigastric pain Active Problems:   Depression   ESRD on dialysis (HCC)   Hypercalcemia   Elevated troponin   Compression fracture of T10 vertebra (HCC)   T12 compression fracture (HCC)   Chronic pancreatitis (HCC)   Abdominal pain   Fever: - Blood culture x 2. - Chest x-ray. - Patient has a tunneled dialysis catheter. - Start IV Vanco and ceftazidime after blood cultures. - Have a low threshold to consult infectious  disease team.  Abdominal Epigastric Pain associated w/ Vomiting:  CLD being advanced to FLD to Regular Diet, Supportive Care. IV PPI and Pepcid. Antiemetics. Gentle IVF w/ LR @ 40 mL/hr (Already s/p 1 L of LR bolus). If not improving or worsening consider GI Evaluation (sees GI at [facility]). Also s/p Maalox/Myland + Lidocain Viscous. Pain control and Analgesia w/ Hydromorphone 0.5 mg po q2hprn and Acetaminophen  650 mg po q6hprn Mild Pain 07/30/2024: Continue IV Protonix .  Likely discharge back home within the next 24 hours. 07/31/2024: GI symptoms have resolved significantly.   Dyspnea: Check BNP and ECHO. CT Chest/Abd/Pelvis showed no findings in the chest. Not wearing supplemental O2. Check Ambulatory Home O2 Screen.  07/30/2024: Resolved significantly.   Lightheadedness/Near Syncope: Checking ECHO. Troponin Mildly elevated but flat and trended down. PT/OT. Gentle IVF Hydration as above. Check Orthostatic VS; Monitor on Tele for any Arrhythmias  07/30/2024: Resolved.   ESRD on HD TThSat: BUN/Cr Trend: Last Labs       Recent Labs  Lab 07/28/24 1622 07/29/24 0500 07/30/24 1635  BUN 21 27* 22  CREATININE 5.07* 6.04* 5.08*    -Gentle IVF as Above. C/w Cinacalcet  30 mg TThSat and Sevelamer  Carbonate 1600 mg po TID -Avoid Nephrotoxic Medications, Contrast Dyes, Hypotension and Dehydration to Ensure Adequate Renal Perfusion and will need to Renally Adjust Meds -Continue to Monitor and Trend Renal Function carefully and repeat CMP in the AM  -Notify Nephrology for Non-Urgent Consult; Patient still urinates some  07/30/2024: Patient underwent hemodialysis today.  Further care as per nephrology team.   Chronic Pancreatitis: Lipase Level was  79. C/w Lipase/Protease/Amylase 36,000-72,000 units as indicated. Diet Advanced as above but if Abd Pain and Sx reoccur will need to cut Diet back.    Hypercalcemia: Calcium  Level Trend:  Last Labs       Recent Labs  Lab 07/28/24 1622 07/29/24 0500  07/30/24 1635  CALCIUM  10.4* 9.8 9.1    -CTM and Trend and repeat CMP in the AM 07/30/2024: Resolved.  Calcium  of 9.1.   T10/T12 Compression Fx:  Asymptomatic. Noted on CT Chest/Abd/Pelvis and noted to be Mild to Moderate and new and possibly acute. TSLO Brace, PT/OT Consults; Pain Control as above if needed. If becomes symptomatic and painful will need MRI of Backand Consideration for Kyphoplasty  07/30/2024: Patient remains asymptomatic.   Normocytic Anemia/Anemia of Chronic Kidney Disease:  Last Labs       Recent Labs  Lab 07/28/24 1622 07/29/24 0500 07/30/24 1635  HGB 13.4 12.4* 11.6*  HCT 41.8 38.0* 34.6*  MCV 90.9 92.5 91.1    -Check Anemia Panel in the AM. CTM for S/Sx of Bleeding; No overt bleeding noted. Repeat CBC in the AM 07/30/2024: Hemoglobin of 11.6 g/dL.  Hemoglobin level is at goal for ESRD.   Depression and Anxiety: C/w Sertraline  100 mg po Daily    Glaucoma: C/w Latanoprost  0.005% Ophthalmic Solution qHS and Timolol  0.5% 1 drop Both Eyes every Morning    Overweight: Complicates overall prognosis and care. Estimated body mass index is 27.69 kg/m as calculated from the following:   Height as of this encounter: 6' 1 (1.854 m).   Weight as of this encounter: 95.2 kg. Weight Loss and Dietary Counseling given  DVT prophylaxis:  Code Status: Full code. Family Communication:  Disposition Plan: Observation.  Likely discharge back on tomorrow.   Consultants:  Nephrology.    Procedures:  Patient underwent hemodialysis earlier today.  Antimicrobials:  None.   Subjective: Patient continues to report vague abdominal pain. Not keen on being discharged back home today.  Objective: Vitals:   07/31/24 1415 07/31/24 1558 07/31/24 1740 07/31/24 1826  BP:  (!) 97/57    Pulse:  99    Resp: 17 (!) 21 20 20   Temp: (!) 101.4 F (38.6 C) (!) 100.5 F (38.1 C) (!) 101 F (38.3 C) (!) 100.9 F (38.3 C)  TempSrc: Oral Oral Oral Oral  SpO2:  97%    Weight:       Height:        Intake/Output Summary (Last 24 hours) at 07/31/2024 2016 Last data filed at 07/31/2024 1645 Gross per 24 hour  Intake 717 ml  Output 450 ml  Net 267 ml   Filed Weights   07/30/24 0635 07/30/24 0748 07/31/24 0500  Weight: 86.5 kg 95.2 kg 95.2 kg    Examination:  General exam: Appears calm and comfortable  Respiratory system: Clear to auscultation. Respiratory effort normal. Cardiovascular system: S1 & S2 heard Gastrointestinal system: Abdomen is soft, with vague abdominal discomfort. Central nervous system: Alert and oriented. Extremities: No leg edema.  Data Reviewed: I have personally reviewed following labs and imaging studies  CBC: Recent Labs  Lab 07/28/24 1622 07/29/24 0500 07/30/24 1635  WBC 8.5 7.5 7.9  NEUTROABS 5.5 4.4 4.7  HGB 13.4 12.4* 11.6*  HCT 41.8 38.0* 34.6*  MCV 90.9 92.5 91.1  PLT 226 196 204   Basic Metabolic Panel: Recent Labs  Lab 07/28/24 1622 07/29/24 0500 07/30/24 1635  NA 138 137 136  K 3.8 3.6 3.4*  CL 93* 95* 100  CO2 26 29 24   GLUCOSE 106* 93 98  BUN 21 27* 22  CREATININE 5.07* 6.04* 5.08*  CALCIUM  10.4* 9.8 9.1  MG  --  2.1  --   PHOS  --  4.0 3.2   GFR: Estimated Creatinine Clearance: 14.9 mL/min (A) (by C-G formula based on SCr of 5.08 mg/dL (H)). Liver Function Tests: Recent Labs  Lab 07/28/24 1622 07/29/24 0500 07/30/24 1635  AST 18 14*  --   ALT 7 8  --   ALKPHOS 87 74  --   BILITOT 1.0 0.8  --   PROT 7.9 6.6  --   ALBUMIN  4.6 4.1 3.4*   Recent Labs  Lab 07/28/24 1622  LIPASE 79*   No results for input(s): AMMONIA in the last 168 hours. Coagulation Profile: No results for input(s): INR, PROTIME in the last 168 hours. Cardiac Enzymes: No results for input(s): CKTOTAL, CKMB, CKMBINDEX, TROPONINI in the last 168 hours. BNP (last 3 results) Recent Labs    07/29/24 0500  PROBNP 992.0*   HbA1C: No results for input(s): HGBA1C in the last 72 hours. CBG: Recent Labs   Lab 07/28/24 1430  GLUCAP 107*   Lipid Profile: No results for input(s): CHOL, HDL, LDLCALC, TRIG, CHOLHDL, LDLDIRECT in the last 72 hours. Thyroid Function Tests: No results for input(s): TSH, T4TOTAL, FREET4, T3FREE, THYROIDAB in the last 72 hours. Anemia Panel: No results for input(s): VITAMINB12, FOLATE, FERRITIN, TIBC, IRON , RETICCTPCT in the last 72 hours. Urine analysis:    Component Value Date/Time   COLORURINE YELLOW 07/31/2024 1140   APPEARANCEUR CLEAR 07/31/2024 1140   LABSPEC 1.009 07/31/2024 1140   PHURINE 9.0 (H) 07/31/2024 1140   GLUCOSEU NEGATIVE 07/31/2024 1140   HGBUR NEGATIVE 07/31/2024 1140   BILIRUBINUR NEGATIVE 07/31/2024 1140   KETONESUR NEGATIVE 07/31/2024 1140   PROTEINUR 100 (A) 07/31/2024 1140   NITRITE NEGATIVE 07/31/2024 1140   LEUKOCYTESUR NEGATIVE 07/31/2024 1140   Sepsis Labs: @LABRCNTIP (procalcitonin:4,lacticidven:4)  ) Recent Results (from the past 240 hours)  Culture, blood (Routine X 2) w Reflex to ID Panel     Status: None (Preliminary result)   Collection Time: 07/31/24  2:02 PM   Specimen: BLOOD LEFT HAND  Result Value Ref Range Status   Specimen Description BLOOD LEFT HAND  Final   Special Requests   Final    BOTTLES DRAWN AEROBIC AND ANAEROBIC Blood Culture results may not be optimal due to an inadequate volume of blood received in culture bottles Performed at [facility], [address] [place], [place] [address]    Culture PENDING  Incomplete   Report Status PENDING  Incomplete         Radiology Studies: No results found.       Scheduled Meds:  Chlorhexidine  Gluconate Cloth  6 each Topical Q0600   cinacalcet   30 mg Oral Q T,Th,Sa-HD   famotidine  20 mg Oral Daily   latanoprost   1 drop Both Eyes QHS   lipase/protease/amylase  72,000 Units Oral TID WC   pantoprazole  (PROTONIX ) IV  40 mg Intravenous Q12H   sertraline   100 mg Oral Daily   sevelamer  carbonate  1,600 mg Oral  TID WC   timolol   1 drop Both Eyes q morning   Continuous Infusions:  cefTAZidime (FORTAZ)  IV 1 g (07/31/24 1828)   sodium chloride      [START ON 08/02/2024] vancomycin        LOS: 0 days    Time spent: 35 minutes.  Leatrice Chapel, MD  [facility] Pager #: [phone] 7PM-7AM contact night coverage as above

## 2024-08-01 DIAGNOSIS — R1013 Epigastric pain: Secondary | ICD-10-CM | POA: Diagnosis not present

## 2024-08-01 LAB — CBC WITH DIFFERENTIAL/PLATELET
Abs Immature Granulocytes: 0.05 K/uL (ref 0.00–0.07)
Basophils Absolute: 0 K/uL (ref 0.0–0.1)
Basophils Relative: 1 %
Eosinophils Absolute: 0.1 K/uL (ref 0.0–0.5)
Eosinophils Relative: 2 %
HCT: 33.4 % — ABNORMAL LOW (ref 39.0–52.0)
Hemoglobin: 10.6 g/dL — ABNORMAL LOW (ref 13.0–17.0)
Immature Granulocytes: 1 %
Lymphocytes Relative: 11 %
Lymphs Abs: 0.7 K/uL (ref 0.7–4.0)
MCH: 29.9 pg (ref 26.0–34.0)
MCHC: 31.7 g/dL (ref 30.0–36.0)
MCV: 94.1 fL (ref 80.0–100.0)
Monocytes Absolute: 0.6 K/uL (ref 0.1–1.0)
Monocytes Relative: 9 %
Neutro Abs: 5 K/uL (ref 1.7–7.7)
Neutrophils Relative %: 76 %
Platelets: 191 K/uL (ref 150–400)
RBC: 3.55 MIL/uL — ABNORMAL LOW (ref 4.22–5.81)
RDW: 14.8 % (ref 11.5–15.5)
WBC: 6.5 K/uL (ref 4.0–10.5)
nRBC: 0 % (ref 0.0–0.2)

## 2024-08-01 LAB — RENAL FUNCTION PANEL
Albumin: 2.9 g/dL — ABNORMAL LOW (ref 3.5–5.0)
Anion gap: 17 — ABNORMAL HIGH (ref 5–15)
BUN: 55 mg/dL — ABNORMAL HIGH (ref 8–23)
CO2: 20 mmol/L — ABNORMAL LOW (ref 22–32)
Calcium: 8.5 mg/dL — ABNORMAL LOW (ref 8.9–10.3)
Chloride: 98 mmol/L (ref 98–111)
Creatinine, Ser: 7.53 mg/dL — ABNORMAL HIGH (ref 0.61–1.24)
GFR, Estimated: 7 mL/min — ABNORMAL LOW (ref >60)
Glucose, Bld: 154 mg/dL — ABNORMAL HIGH (ref 70–99)
Phosphorus: 4.1 mg/dL (ref 2.5–4.6)
Potassium: 3.7 mmol/L (ref 3.5–5.1)
Sodium: 135 mmol/L (ref 135–145)

## 2024-08-01 MED ORDER — QUETIAPINE FUMARATE ER 50 MG PO TB24
150.0000 mg | ORAL_TABLET | Freq: Every day | ORAL | Status: DC
  Administered 2024-08-01 – 2024-08-02 (×2): 150 mg via ORAL
  Filled 2024-08-01 (×2): qty 3

## 2024-08-01 MED ORDER — SODIUM CHLORIDE 0.9 % IV SOLN
1.0000 g | INTRAVENOUS | Status: DC
  Administered 2024-08-01 – 2024-08-02 (×2): 1 g via INTRAVENOUS
  Filled 2024-08-01 (×2): qty 10

## 2024-08-01 MED ORDER — CHLORHEXIDINE GLUCONATE CLOTH 2 % EX PADS
6.0000 | MEDICATED_PAD | Freq: Every day | CUTANEOUS | Status: DC
  Administered 2024-08-01 – 2024-08-03 (×2): 6 via TOPICAL

## 2024-08-01 NOTE — Progress Notes (Signed)
 PROGRESS NOTE    Manuel Ortega  FMW:[id number] DOB: 09-01-1951 DOA: 07/28/2024 PCP: Lorren Greig PARAS, NP  Outpatient Specialists:     Brief Narrative:  The patient is a 73 year old African-American male with a past medical history significant for peptic ulcer disease, chronic pancreatitis ESRD on HD on Tuesday Thursday Saturday.  Patient presented to the hospital with epigastric pain, associated with recurrent nausea and 3-4 daily episodes of nonbloody nonbilious emesis.  Additionally, patient endorsed dyspnea and lightheadness with him feeling like he was going to pass out. CT of the chest/abdomen/pelvis were done and showed mild to moderate compression fractures at T10 and T12 which were new from prior and possibly acute but an otherwise, not acute findings in the chest, abdomen, or pelvis.  Patient was placed on IV PPI twice daily as well as famotidine and given antiemetics and pain medications.  Patient was transferred to [facility] given his ESRD and need for hemodialysis TThSat.   07/31/2024: Patient seen.  Patient developed fever today.  Will panculture patient.  Will start patient on IV vancomycin /ceftazidime.  Discussed with nephrology team.  Low threshold to consult infectious disease team.  Patient has a tunneled dialysis catheter.  Check CBC and renal panel in AM.  08/01/2024: Patient seen.  No new complaints.  Urine culture is growing Klebsiella.  Will discontinue ceftazidime.  Start patient on IV Rocephin .  Follow final cultures.  Blood culture result is still pending.  Patient is not a particularly good historian.  Tmax of 101.4 F.  Assessment & Plan:   Principal Problem:   Epigastric pain Active Problems:   Depression   ESRD on dialysis (HCC)   Hypercalcemia   Elevated troponin   Compression fracture of T10 vertebra (HCC)   T12 compression fracture (HCC)   Chronic pancreatitis (HCC)   Abdominal pain   Fever: - Blood culture x 2.  Result is still pending - Chest x-ray  was nonrevealing. -Urine culture is growing Klebsiella. - Patient has a tunneled dialysis catheter. - Start IV Vanco - Discontinue ceftazidime. - Start IV Rocephin . - Follow final cultures.    Abdominal Epigastric Pain associated w/ Vomiting:  -Resolved significantly. - Continue IV Protonix .   07/31/2024: GI symptoms have resolved significantly.   Dyspnea: - Resolved significantly. - Echo revealed normal left ventricular ejection fraction and indeterminate diastolic filling due to E-A fusion.   Lightheadedness/Near Syncope:  - Resolved. - Patient is a poor historian. - PT/OT.    ESRD on HD TThSat: BUN/Cr Trend: Last Labs       Recent Labs  Lab 07/28/24 1622 07/29/24 0500 07/30/24 1635  BUN 21 27* 22  CREATININE 5.07* 6.04* 5.08*    - Continue hemodialysis TTS. - Nephrology input is appreciated. - Follow blood culture result (patient has TDC).   Chronic Pancreatitis: Lipase Level was 79. C/w Lipase/Protease/Amylase 36,000-72,000 units as indicated. Diet Advanced as above but if Abd Pain and Sx reoccur will need to cut Diet back.    Hypercalcemia:  -Calcium  of 8.5 from albumin  of 2.9 today.     T10/T12 Compression Fx: -Asymptomatic. - Noted on CT Chest/Abd/Pelvis and noted to be Mild to Moderate and new and possibly acute.  - Consider TSLO Brace - Optimize pain Control.   Anemia: -Hemoglobin of 10.6 g/dL today.  MCV of 94.1. - Likely multifactorial. -Last vitamin B12 done on 10/10/2023 was 241 and folate level of 12.7. -Repeat iron  panel, vitamin B12 and folate level.   Depression and Anxiety:  - Continue Sertraline   100 mg po Daily    Glaucoma:  - Continue Latanoprost  0.005% Ophthalmic Solution qHS and Timolol  0.5% 1 drop Both Eyes every Morning   Sepsis/SIRS/UTI: -Urine culture is growing Klebsiella pneumonia. - DC ceftazidime. - Start Rocephin . - Follow-up blood cultures.    DVT prophylaxis:  Code Status: Full code. Family Communication:   Disposition Plan: Observation.  Likely discharge back on tomorrow.   Consultants:  Nephrology.    Procedures:  Patient underwent hemodialysis earlier today.  Antimicrobials:  Discontinue IV ceftazidime. Start IV Rocephin . Continue vancomycin    Subjective: -No new complaints. - Patient is a poor historian.  Objective: Vitals:   08/01/24 0858 08/01/24 1023 08/01/24 1114 08/01/24 1623  BP:   121/75 126/77  Pulse:   77 60  Resp: 17 19 16 13   Temp:   98 F (36.7 C) 97.9 F (36.6 C)  TempSrc:   Oral Oral  SpO2:   100% 97%  Weight:      Height:        Intake/Output Summary (Last 24 hours) at 08/01/2024 1723 Last data filed at 08/01/2024 1345 Gross per 24 hour  Intake 480 ml  Output 350 ml  Net 130 ml   Filed Weights   07/30/24 0748 07/31/24 0500 08/01/24 0559  Weight: 95.2 kg 95.2 kg 88.5 kg    Examination:  General exam: Appears calm and comfortable  Respiratory system: Clear to auscultation. Respiratory effort normal. Cardiovascular system: S1 & S2 heard Gastrointestinal system: Abdomen is soft, with vague abdominal discomfort. Central nervous system: Alert and oriented. Extremities: No leg edema.  Data Reviewed: I have personally reviewed following labs and imaging studies  CBC: Recent Labs  Lab 07/28/24 1622 07/29/24 0500 07/30/24 1635 08/01/24 0301  WBC 8.5 7.5 7.9 6.5  NEUTROABS 5.5 4.4 4.7 5.0  HGB 13.4 12.4* 11.6* 10.6*  HCT 41.8 38.0* 34.6* 33.4*  MCV 90.9 92.5 91.1 94.1  PLT 226 196 204 191   Basic Metabolic Panel: Recent Labs  Lab 07/28/24 1622 07/29/24 0500 07/30/24 1635 08/01/24 0301  NA 138 137 136 135  K 3.8 3.6 3.4* 3.7  CL 93* 95* 100 98  CO2 26 29 24  20*  GLUCOSE 106* 93 98 154*  BUN 21 27* 22 55*  CREATININE 5.07* 6.04* 5.08* 7.53*  CALCIUM  10.4* 9.8 9.1 8.5*  MG  --  2.1  --   --   PHOS  --  4.0 3.2 4.1   GFR: Estimated Creatinine Clearance: 10 mL/min (A) (by C-G formula based on SCr of 7.53 mg/dL (H)). Liver  Function Tests: Recent Labs  Lab 07/28/24 1622 07/29/24 0500 07/30/24 1635 08/01/24 0301  AST 18 14*  --   --   ALT 7 8  --   --   ALKPHOS 87 74  --   --   BILITOT 1.0 0.8  --   --   PROT 7.9 6.6  --   --   ALBUMIN  4.6 4.1 3.4* 2.9*   Recent Labs  Lab 07/28/24 1622  LIPASE 79*   No results for input(s): AMMONIA in the last 168 hours. Coagulation Profile: No results for input(s): INR, PROTIME in the last 168 hours. Cardiac Enzymes: No results for input(s): CKTOTAL, CKMB, CKMBINDEX, TROPONINI in the last 168 hours. BNP (last 3 results) Recent Labs    07/29/24 0500  PROBNP 992.0*   HbA1C: No results for input(s): HGBA1C in the last 72 hours. CBG: Recent Labs  Lab 07/28/24 1430  GLUCAP 107*   Lipid Profile:  No results for input(s): CHOL, HDL, LDLCALC, TRIG, CHOLHDL, LDLDIRECT in the last 72 hours. Thyroid Function Tests: No results for input(s): TSH, T4TOTAL, FREET4, T3FREE, THYROIDAB in the last 72 hours. Anemia Panel: No results for input(s): VITAMINB12, FOLATE, FERRITIN, TIBC, IRON , RETICCTPCT in the last 72 hours. Urine analysis:    Component Value Date/Time   COLORURINE YELLOW 07/31/2024 1140   APPEARANCEUR CLEAR 07/31/2024 1140   LABSPEC 1.009 07/31/2024 1140   PHURINE 9.0 (H) 07/31/2024 1140   GLUCOSEU NEGATIVE 07/31/2024 1140   HGBUR NEGATIVE 07/31/2024 1140   BILIRUBINUR NEGATIVE 07/31/2024 1140   KETONESUR NEGATIVE 07/31/2024 1140   PROTEINUR 100 (A) 07/31/2024 1140   NITRITE NEGATIVE 07/31/2024 1140   LEUKOCYTESUR NEGATIVE 07/31/2024 1140   Sepsis Labs: @LABRCNTIP (procalcitonin:4,lacticidven:4)  ) Recent Results (from the past 240 hours)  Culture, OB Urine     Status: Abnormal (Preliminary result)   Collection Time: 07/31/24 11:41 AM   Specimen: Urine, Random  Result Value Ref Range Status   Specimen Description URINE, RANDOM  Final   Special Requests NONE  Final   Culture (A)  Final     >=100,000 COLONIES/mL KLEBSIELLA PNEUMONIAE 70,000 COLONIES/mL KLEBSIELLA OXYTOCA CULTURE REINCUBATED FOR BETTER GROWTH NO GROUP B STREP (S.AGALACTIAE) ISOLATED Performed at [facility], [address], [place], [place] [address]    Report Status PENDING  Incomplete  Culture, blood (Routine X 2) w Reflex to ID Panel     Status: None (Preliminary result)   Collection Time: 07/31/24  2:02 PM   Specimen: BLOOD LEFT HAND  Result Value Ref Range Status   Specimen Description BLOOD LEFT HAND  Final   Special Requests   Final    BOTTLES DRAWN AEROBIC AND ANAEROBIC Blood Culture results may not be optimal due to an inadequate volume of blood received in culture bottles   Culture   Final    NO GROWTH < 24 HOURS Performed at [facility], [address] [place], [place] [address]    Report Status PENDING  Incomplete  Culture, blood (Routine X 2) w Reflex to ID Panel     Status: None (Preliminary result)   Collection Time: 07/31/24  2:05 PM   Specimen: BLOOD LEFT ARM  Result Value Ref Range Status   Specimen Description BLOOD LEFT ARM  Final   Special Requests   Final    BOTTLES DRAWN AEROBIC AND ANAEROBIC Blood Culture adequate volume   Culture   Final    NO GROWTH < 24 HOURS Performed at [facility], [address] [place], [place] [address]    Report Status PENDING  Incomplete         Radiology Studies: DG CHEST PORT 1 VIEW Result Date: 07/31/2024 EXAM: 1 VIEW(S) XRAY OF THE CHEST 07/31/2024 09:39:00 PM COMPARISON: 02/05/2024 CLINICAL HISTORY: Fever FINDINGS: LINES, TUBES AND DEVICES: Stable right IJ CVC catheter in place. LUNGS AND PLEURA: No focal pulmonary opacity. No pulmonary edema. No pleural effusion. No pneumothorax. HEART AND MEDIASTINUM: Aortic atherosclerosis. No acute abnormality of the cardiac and mediastinal silhouettes. BONES AND SOFT TISSUES: No acute fracture or destructive lesion. Multilevel thoracic osteophytosis. IMPRESSION: 1. No acute  cardiopulmonary findings. Electronically signed by: Norman Gatlin MD 07/31/2024 09:55 PM EDT RP Workstation: [id number]         Scheduled Meds:  Chlorhexidine  Gluconate Cloth  6 each Topical Q0600   Chlorhexidine  Gluconate Cloth  6 each Topical Q0600   cinacalcet   30 mg Oral Q T,Th,Sa-HD   famotidine  20 mg Oral Daily   latanoprost   1 drop Both Eyes QHS   lipase/protease/amylase  72,000 Units Oral TID WC   pantoprazole  (PROTONIX ) IV  40 mg Intravenous Q12H   QUEtiapine   150 mg Oral QHS   sertraline   100 mg Oral Daily   sevelamer  carbonate  1,600 mg Oral TID WC   timolol   1 drop Both Eyes q morning   Continuous Infusions:  cefTAZidime (FORTAZ)  IV 1 g (08/01/24 1620)   sodium chloride      [START ON 08/02/2024] vancomycin        LOS: 1 day    Time spent: 35 minutes.    Leatrice Chapel, MD  [facility] Pager #: [phone] 7PM-7AM contact night coverage as above

## 2024-08-01 NOTE — Progress Notes (Signed)
 Pt receives out-pt HD at [facility] [place] TTS 0620. Was transferred a few months ago according to [facility] [place] clinic. Nephrology informed of this change. Will continue to assist.   Lavanda Fredrickson Dialysis navigator [phone]

## 2024-08-01 NOTE — Progress Notes (Signed)
 Mobility Specialist Progress Note:    08/01/24 1140  Mobility  Activity Dangled on edge of bed  Level of Assistance Standby assist, set-up cues, supervision of patient - no hands on  Assistive Device None  Activity Response Tolerated well  Mobility Referral Yes  Mobility visit 1 Mobility  Mobility Specialist Start Time (ACUTE ONLY) 1140  Mobility Specialist Stop Time (ACUTE ONLY) 1149  Mobility Specialist Time Calculation (min) (ACUTE ONLY) 9 min   Pt received in bed, deferred ambulation d/t N/V. Pt agreeable to dangle EOB and perform exercises. Tolerated well, no physical assistance required. Left pt with all needs met, call bell in reach.    Maikayla Beggs Mobility Specialist Please contact via Special educational needs teacher or  Rehab office at [phone]

## 2024-08-01 NOTE — Progress Notes (Signed)
 [facility] Progress Note   Subjective:   Reports feeling better today, SOB is improved. Does not feel febrile. Denies CP, abdominal pain, dizziness.   Objective Vitals:   07/31/24 2200 07/31/24 2302 08/01/24 0559 08/01/24 0747  BP: 116/84 119/77  122/74  Pulse: 100   88  Resp: 18 20 (!) 26 17  Temp: 99.3 F (37.4 C) 98.3 F (36.8 C)  99.9 F (37.7 C)  TempSrc: Oral Oral  Oral  SpO2: 100% 100%    Weight:   88.5 kg   Height:       Physical Exam General: Alert male in NAD Heart: RRR, no murmurs, rubs or gallops Lungs: CTA bilaterally, respirations unlabored on RA Abdomen: Soft, non-distended Extremities: No edema b/l lower extremities Dialysis Access:  [facility]  Additional Objective Labs: Basic Metabolic Panel: Recent Labs  Lab 07/29/24 0500 07/30/24 1635 08/01/24 0301  NA 137 136 135  K 3.6 3.4* 3.7  CL 95* 100 98  CO2 29 24 20*  GLUCOSE 93 98 154*  BUN 27* 22 55*  CREATININE 6.04* 5.08* 7.53*  CALCIUM  9.8 9.1 8.5*  PHOS 4.0 3.2 4.1   Liver Function Tests: Recent Labs  Lab 07/28/24 1622 07/29/24 0500 07/30/24 1635 08/01/24 0301  AST 18 14*  --   --   ALT 7 8  --   --   ALKPHOS 87 74  --   --   BILITOT 1.0 0.8  --   --   PROT 7.9 6.6  --   --   ALBUMIN  4.6 4.1 3.4* 2.9*   Recent Labs  Lab 07/28/24 1622  LIPASE 79*   CBC: Recent Labs  Lab 07/28/24 1622 07/29/24 0500 07/30/24 1635 08/01/24 0301  WBC 8.5 7.5 7.9 6.5  NEUTROABS 5.5 4.4 4.7 5.0  HGB 13.4 12.4* 11.6* 10.6*  HCT 41.8 38.0* 34.6* 33.4*  MCV 90.9 92.5 91.1 94.1  PLT 226 196 204 191   Blood Culture    Component Value Date/Time   SDES BLOOD LEFT ARM 07/31/2024 1405   SPECREQUEST  07/31/2024 1405    BOTTLES DRAWN AEROBIC AND ANAEROBIC Blood Culture adequate volume   CULT  07/31/2024 1405    NO GROWTH < 24 HOURS Performed at [facility], [address], [place], [place] [address]    REPTSTATUS PENDING 07/31/2024 1405    Cardiac Enzymes: No results for  input(s): CKTOTAL, CKMB, CKMBINDEX, TROPONINI in the last 168 hours. CBG: Recent Labs  Lab 07/28/24 1430  GLUCAP 107*   Iron  Studies: No results for input(s): IRON , TIBC, TRANSFERRIN, FERRITIN in the last 72 hours. @lablastinr3 @ Studies/Results: DG CHEST PORT 1 VIEW Result Date: 07/31/2024 EXAM: 1 VIEW(S) XRAY OF THE CHEST 07/31/2024 09:39:00 PM COMPARISON: 02/05/2024 CLINICAL HISTORY: Fever FINDINGS: LINES, TUBES AND DEVICES: Stable right IJ CVC catheter in place. LUNGS AND PLEURA: No focal pulmonary opacity. No pulmonary edema. No pleural effusion. No pneumothorax. HEART AND MEDIASTINUM: Aortic atherosclerosis. No acute abnormality of the cardiac and mediastinal silhouettes. BONES AND SOFT TISSUES: No acute fracture or destructive lesion. Multilevel thoracic osteophytosis. IMPRESSION: 1. No acute cardiopulmonary findings. Electronically signed by: Norman Gatlin MD 07/31/2024 09:55 PM EDT RP Workstation: [id number]   Medications:  cefTAZidime (FORTAZ)  IV 1 g (07/31/24 1828)   sodium chloride      [START ON 08/02/2024] vancomycin       Chlorhexidine  Gluconate Cloth  6 each Topical Q0600   cinacalcet   30 mg Oral Q T,Th,Sa-HD   famotidine  20 mg Oral Daily  latanoprost   1 drop Both Eyes QHS   lipase/protease/amylase  72,000 Units Oral TID WC   pantoprazole  (PROTONIX ) IV  40 mg Intravenous Q12H   sertraline   100 mg Oral Daily   sevelamer  carbonate  1,600 mg Oral TID WC   timolol   1 drop Both Eyes q morning    Dialysis Orders: TTS NW 4h  B400  89.5kg   3K bath  RIJ TDC  Heparin  5000 Last OP HD 10/16, post wt 89kg Usually gets to the dry wt Dry wt lowered recently Goes to all sessions, signs off 15-30 min early 1/2 the time  Assessment/Plan: Abdominal pain w/vomiting: Improving.  CT abdomen with no acute findings. H/o chronic pancreatitis and PUD. Per pmd.   Lightheaded/near syncope - resolved per patient.  ECHO completed showing improvement in EF to 65-70% and no  other significant changes from prior.  ESRD: on HD TTS. No missed HD. Next HD on 08/02/24.  HTN/vol: BP in goal.  Not on medication.  Got 2 L LR on admission. Does not appear overloaded, still makes a lot of urine.  UF as tolerated.  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 Secondary hyperparathyroidism - Calcium  and phos in goal.  Continue home meds.  Not on VDRA.  Nutrition - on regular diet. Monitor labs. Fever- reports fever yesterday. Normal WBC count and cultures NGTD- if positive he will need a line holiday    Lucie Collet, PA-C 08/01/2024, 10:01 AM  [facility] Pager: [phone]

## 2024-08-01 NOTE — Plan of Care (Signed)

## 2024-08-02 ENCOUNTER — Telehealth: Payer: Self-pay

## 2024-08-02 DIAGNOSIS — R109 Unspecified abdominal pain: Secondary | ICD-10-CM

## 2024-08-02 DIAGNOSIS — N3000 Acute cystitis without hematuria: Secondary | ICD-10-CM

## 2024-08-02 DIAGNOSIS — N186 End stage renal disease: Secondary | ICD-10-CM | POA: Diagnosis not present

## 2024-08-02 DIAGNOSIS — R1013 Epigastric pain: Secondary | ICD-10-CM | POA: Diagnosis not present

## 2024-08-02 LAB — CBC
HCT: 31.1 % — ABNORMAL LOW (ref 39.0–52.0)
Hemoglobin: 9.9 g/dL — ABNORMAL LOW (ref 13.0–17.0)
MCH: 29.9 pg (ref 26.0–34.0)
MCHC: 31.8 g/dL (ref 30.0–36.0)
MCV: 94 fL (ref 80.0–100.0)
Platelets: 170 K/uL (ref 150–400)
RBC: 3.31 MIL/uL — ABNORMAL LOW (ref 4.22–5.81)
RDW: 15 % (ref 11.5–15.5)
WBC: 7 K/uL (ref 4.0–10.5)
nRBC: 0 % (ref 0.0–0.2)

## 2024-08-02 LAB — RENAL FUNCTION PANEL
Albumin: 2.7 g/dL — ABNORMAL LOW (ref 3.5–5.0)
Anion gap: 13 (ref 5–15)
BUN: 66 mg/dL — ABNORMAL HIGH (ref 8–23)
CO2: 21 mmol/L — ABNORMAL LOW (ref 22–32)
Calcium: 9 mg/dL (ref 8.9–10.3)
Chloride: 102 mmol/L (ref 98–111)
Creatinine, Ser: 8.67 mg/dL — ABNORMAL HIGH (ref 0.61–1.24)
GFR, Estimated: 6 mL/min — ABNORMAL LOW (ref >60)
Glucose, Bld: 86 mg/dL (ref 70–99)
Phosphorus: 6.2 mg/dL — ABNORMAL HIGH (ref 2.5–4.6)
Potassium: 4.6 mmol/L (ref 3.5–5.1)
Sodium: 136 mmol/L (ref 135–145)

## 2024-08-02 LAB — IRON AND TIBC
Iron: 27 ug/dL — ABNORMAL LOW (ref 45–182)
Saturation Ratios: 16 % — ABNORMAL LOW (ref 17.9–39.5)
TIBC: 165 ug/dL — ABNORMAL LOW (ref 250–450)
UIBC: 138 ug/dL

## 2024-08-02 LAB — FERRITIN: Ferritin: 922 ng/mL — ABNORMAL HIGH (ref 24–336)

## 2024-08-02 LAB — FOLATE: Folate: 9.4 ng/mL (ref >5.9)

## 2024-08-02 LAB — VITAMIN B12: Vitamin B-12: 200 pg/mL (ref 180–914)

## 2024-08-02 MED ORDER — VITAMIN B-12 1000 MCG PO TABS
1000.0000 ug | ORAL_TABLET | Freq: Every day | ORAL | Status: DC
  Administered 2024-08-02 – 2024-08-03 (×2): 1000 ug via ORAL
  Filled 2024-08-02 (×2): qty 1

## 2024-08-02 MED ORDER — HEPARIN SODIUM (PORCINE) 1000 UNIT/ML IJ SOLN
INTRAMUSCULAR | Status: AC
  Filled 2024-08-02: qty 9

## 2024-08-02 MED ORDER — FERROUS SULFATE 325 (65 FE) MG PO TBEC: 325.0000 mg | DELAYED_RELEASE_TABLET | Freq: Every day | ORAL | Status: DC

## 2024-08-02 MED ORDER — HEPARIN SODIUM (PORCINE) 1000 UNIT/ML DIALYSIS: 5000.0000 [IU] | Freq: Once | INTRAMUSCULAR | Status: DC

## 2024-08-02 MED ORDER — PANCRELIPASE (LIP-PROT-AMYL) 36000-114000 UNITS PO CPEP: 36000.0000 [IU] | ORAL_CAPSULE | Freq: Three times a day (TID) | ORAL | Status: DC | PRN

## 2024-08-02 NOTE — Telephone Encounter (Signed)
 Called and spoke with patient in regards current hospital visit patient will back to schedule hfu and ct follow up

## 2024-08-02 NOTE — Plan of Care (Signed)

## 2024-08-02 NOTE — Procedures (Signed)
 Patient seen on Hemodialysis. BP 111/65 (BP Location: Left Arm)   Pulse 67   Temp 97.9 F (36.6 C)   Resp 15   Ht 6' 1 (1.854 m)   Wt 95 kg   SpO2 98%   BMI 27.63 kg/m   QB 400, UF goal 1L Tolerating treatment without complaints at this time.   Gordy Blanch MD [facility]. Office # [phone] Pager # [phone] 10:01 AM

## 2024-08-02 NOTE — Progress Notes (Signed)
 Progress Note    Manuel Ortega   FMW:[id number]  DOB: 06/25/1951  DOA: 07/28/2024     2 PCP: Manuel Greig PARAS, NP  Initial CC: Abd pain  Hospital Course: Manuel Ortega is a 73 year old African-American male with a past medical history significant for peptic ulcer disease, chronic pancreatitis, ESRD on HD. Patient presented to the hospital with epigastric pain, associated with recurrent nausea and 3-4 daily episodes of nonbloody nonbilious emesis.  Additionally, patient endorsed dyspnea and lightheadness with him feeling like he was going to pass out.  CT of the chest/abdomen/pelvis were done and showed mild to moderate compression fractures at T10 and T12 which were new from prior and possibly acute but otherwise, no acute findings in the chest, abdomen, or pelvis.   Patient was placed on IV PPI twice daily as well as famotidine and given antiemetics and pain medications.  Patient was transferred to [facility] given his ESRD and need for hemodialysis.  He developed fever after admission and was started on antibiotics and underwent urine and blood cultures.    Assessment & Plan:     Fever UTI -Oddly, patient endorsing to me today that pain on admission was lower middle abdomen pointing to location of suprapubic area; notes indicate epigastric pain on admission -Given suprapubic discomfort and urinalysis concerning for UTI, possible this was his pain complaints, at least in part - continue rocephin ; d/c vanc - Urine culture growing Kleb pneumo and oxytoca; follow-up for sensitivities - Blood cultures remaining negative from 07/31/2024   Abdominal pain -See reasoning above but also was indicating epigastric pain on admission -Now has overall improved - Continue Protonix    Dyspnea: - Resolved significantly. - Echo revealed normal left ventricular ejection fraction and indeterminate diastolic filling due to E-A fusion.   Lightheadedness/Near Syncope:  - Resolved.  ESRD on HD - HD  per nephrology - underwent HD 10/21; next session 10/23  Chronic Pancreatitis - Lipase Level was 79 - continue creon    Hypercalcemia - resolved    T10/T12 Compression Fx -Asymptomatic. - Noted on CT Chest/Abd/Pelvis and noted to be Mild to Moderate and new and possibly acute.  - no pain - supportive care   Normocytic anemia - baseline Hgb 10-11 g/dL - ACD but also A87 deficiency - B12 level 200; patient also complaining of numbness in his feet (no pain or neuropathic descriptions) - Started on B12   Depression and Anxiety:  - Continue Sertraline  100 mg po Daily    Glaucoma:  - Continue Latanoprost  0.005% Ophthalmic Solution qHS and Timolol  0.5% 1 drop Both Eyes every Morning  Interval History:  No events overnight.  Overall his abdominal pain has improved. Reviewed urine culture results with him today. Awaiting sensitivities to Klebsiella.  Hopeful for discharge tomorrow.  Antimicrobials: Ceftazidime 07/31/2024 >> 08/01/2024 Vancomycin  07/31/2024 >> 08/02/2024 Rocephin  08/01/2024 >> current  DVT prophylaxis:  SCDs Start: 07/29/24 0110   Code Status:   Code Status: Full Code  Mobility Assessment (Last 72 Hours)     Mobility Assessment     Row Name 08/02/24 0730 08/01/24 2000 08/01/24 1615 08/01/24 0745 07/31/24 1615   Does the patient have exclusion criteria? No - Perform mobility assessment No - Perform mobility assessment No - Perform mobility assessment No - Perform mobility assessment No - Perform mobility assessment   What is the highest level of mobility based on the mobility assessment? Level 5 (Ambulates independently) - Balance while walking independently - Complete Level 5 (Ambulates independently) - Balance while walking  independently - Complete Level 5 (Ambulates independently) - Balance while walking independently - Complete Level 5 (Ambulates independently) - Balance while walking independently - Complete Level 5 (Ambulates independently) - Balance while  walking independently - Complete    Row Name 07/31/24 1530 07/31/24 0730 07/31/24 0700 07/31/24 0400 07/31/24 0000   Does the patient have exclusion criteria? No - Perform mobility assessment No - Perform mobility assessment -- No - Perform mobility assessment No - Perform mobility assessment   What is the highest level of mobility based on the mobility assessment? Level 5 (Ambulates independently) - Balance while walking independently - Complete Level 5 (Ambulates independently) - Balance while walking independently - Complete Level 5 (Ambulates independently) - Balance while walking independently - Complete Level 5 (Ambulates independently) - Balance while walking independently - Complete Level 5 (Ambulates independently) - Balance while walking independently - Complete    Row Name 07/30/24 2000           Does the patient have exclusion criteria? No - Perform mobility assessment       What is the highest level of mobility based on the mobility assessment? Level 5 (Ambulates independently) - Balance while walking independently - Complete          Barriers to discharge: none Disposition Plan:  Home HH orders placed: n/a Status is: Inpt  Objective: Blood pressure 113/68, pulse 80, temperature 98.9 F (37.2 C), temperature source Oral, resp. rate 14, height 6' 1 (1.854 m), weight 94 kg, SpO2 96%.  Examination:  Physical Exam Constitutional:      General: He is not in acute distress.    Appearance: Normal appearance.  HENT:     Head: Normocephalic and atraumatic.     Mouth/Throat:     Mouth: Mucous membranes are moist.  Eyes:     Extraocular Movements: Extraocular movements intact.  Cardiovascular:     Rate and Rhythm: Normal rate and regular rhythm.  Pulmonary:     Effort: Pulmonary effort is normal. No respiratory distress.     Breath sounds: Normal breath sounds. No wheezing.  Abdominal:     General: Bowel sounds are normal. There is no distension.     Palpations: Abdomen  is soft.     Tenderness: There is no abdominal tenderness.  Musculoskeletal:        General: Normal range of motion.     Cervical back: Normal range of motion and neck supple.  Skin:    General: Skin is warm and dry.  Neurological:     General: No focal deficit present.     Mental Status: He is alert.  Psychiatric:        Mood and Affect: Mood normal.        Behavior: Behavior normal.      Consultants:  Nephrology  Procedures:    Data Reviewed: Results for orders placed or performed during the hospital encounter of 07/28/24 (from the past 24 hours)  Iron  and TIBC     Status: Abnormal   Collection Time: 08/02/24  3:13 AM  Result Value Ref Range   Iron  27 (L) 45 - 182 ug/dL   TIBC 834 (L) 749 - 549 ug/dL   Saturation Ratios 16 (L) 17.9 - 39.5 %   UIBC 138 ug/dL  Ferritin     Status: Abnormal   Collection Time: 08/02/24  3:13 AM  Result Value Ref Range   Ferritin 922 (H) 24 - 336 ng/mL  Vitamin B12     Status: None  Collection Time: 08/02/24  3:13 AM  Result Value Ref Range   Vitamin B-12 200 180 - 914 pg/mL  Folate     Status: None   Collection Time: 08/02/24  3:13 AM  Result Value Ref Range   Folate 9.4 >5.9 ng/mL  Renal function panel     Status: Abnormal   Collection Time: 08/02/24  7:50 AM  Result Value Ref Range   Sodium 136 135 - 145 mmol/L   Potassium 4.6 3.5 - 5.1 mmol/L   Chloride 102 98 - 111 mmol/L   CO2 21 (L) 22 - 32 mmol/L   Glucose, Bld 86 70 - 99 mg/dL   BUN 66 (H) 8 - 23 mg/dL   Creatinine, Ser 1.32 (H) 0.61 - 1.24 mg/dL   Calcium  9.0 8.9 - 10.3 mg/dL   Phosphorus 6.2 (H) 2.5 - 4.6 mg/dL   Albumin  2.7 (L) 3.5 - 5.0 g/dL   GFR, Estimated 6 (L) >60 mL/min   Anion gap 13 5 - 15  CBC     Status: Abnormal   Collection Time: 08/02/24  7:50 AM  Result Value Ref Range   WBC 7.0 4.0 - 10.5 K/uL   RBC 3.31 (L) 4.22 - 5.81 MIL/uL   Hemoglobin 9.9 (L) 13.0 - 17.0 g/dL   HCT 68.8 (L) 60.9 - 47.9 %   MCV 94.0 80.0 - 100.0 fL   MCH 29.9 26.0 - 34.0  pg   MCHC 31.8 30.0 - 36.0 g/dL   RDW 84.9 88.4 - 84.4 %   Platelets 170 150 - 400 K/uL   nRBC 0.0 0.0 - 0.2 %    I have reviewed pertinent nursing notes, vitals, labs, and images as necessary. I have ordered labwork to follow up on as indicated.  I have reviewed the last notes from staff over past 24 hours. I have discussed patient's care plan and test results with nursing staff, CM/SW, and other staff as appropriate.  Old records reviewed in assessment of this patient  Time spent: Greater than 50% of the 55 minute visit was spent in counseling/coordination of care for the patient as laid out in the A&P.   LOS: 2 days   Alm Apo, MD [facility] 08/02/2024, 4:24 PM

## 2024-08-02 NOTE — Plan of Care (Signed)
 Progressing

## 2024-08-02 NOTE — Hospital Course (Addendum)
 Manuel Ortega is a 73 year old African-American male with a past medical history significant for peptic ulcer disease, chronic pancreatitis, ESRD on HD. Patient presented to the hospital with epigastric pain, associated with recurrent nausea and 3-4 daily episodes of nonbloody nonbilious emesis.  Additionally, patient endorsed dyspnea and lightheadness with him feeling like he was going to pass out.  CT of the chest/abdomen/pelvis were done and showed mild to moderate compression fractures at T10 and T12 which were new from prior and possibly acute but otherwise, no acute findings in the chest, abdomen, or pelvis.   Patient was placed on IV PPI twice daily as well as famotidine and given antiemetics and pain medications.  Patient was transferred to [facility] given his ESRD and need for hemodialysis.  He developed fever after admission and was started on antibiotics and underwent urine and blood cultures.    Assessment & Plan:   Fever UTI -Oddly, patient endorsing to me today that pain on admission was lower middle abdomen pointing to location of suprapubic area; notes indicate epigastric pain on admission -Given suprapubic discomfort and urinalysis concerning for UTI, possible this was his pain complaints, at least in part - continue rocephin ; d/c vanc - Urine culture growing Kleb pneumo and oxytoca; sensitivities reviewed.  Transitioned to ciprofloxacin  to complete course - Blood cultures remaining negative from 07/31/2024   Abdominal pain -See reasoning above but also was indicating epigastric pain on admission -Now has overall improved - Continue Protonix    Dyspnea: - Resolved significantly. - Echo revealed normal left ventricular ejection fraction and indeterminate diastolic filling due to E-A fusion.   Lightheadedness/Near Syncope -resolved  ESRD on HD - HD per nephrology - underwent HD 10/21; next session 10/23  Chronic Pancreatitis - Lipase Level was 79 - continue creon     Hypercalcemia - resolved    T10/T12 Compression Fx -Asymptomatic. - Noted on CT Chest/Abd/Pelvis and noted to be Mild to Moderate and new and possibly acute.  - no pain - supportive care   Normocytic anemia - baseline Hgb 10-11 g/dL - ACD but also A87 deficiency - B12 level 200; patient also complaining of numbness in his feet (no pain or neuropathic descriptions) - Started on B12   Depression and Anxiety:  - Continue Sertraline  100 mg po Daily    Glaucoma:  - Continue Latanoprost  0.005% Ophthalmic Solution qHS and Timolol  0.5% 1 drop Both Eyes every Morning

## 2024-08-02 NOTE — Telephone Encounter (Signed)
 I called patient with pcp recommendations however patient is in the hospital he had acid build up on his stomach.  Patient stated he was feeling much better.

## 2024-08-02 NOTE — Procedures (Signed)
 Received patient in bed to unit.  Alert and oriented.  Informed consent signed and in chart.   TX duration:4hrs  Patient tolerated well.  Transported back to the room  Alert, without acute distress.  Pt c/o left arm pain r/t IV. Hand-off given to patient's nurse.    Access used: R chest catheter Access issues: Reversed lines   Total UF removed: 1.0L Medication(s) given: None Post HD weight: 100.3kg Post HD VS: 112/68   Manuel Ortega S [facility]

## 2024-08-03 ENCOUNTER — Other Ambulatory Visit (HOSPITAL_COMMUNITY): Payer: Self-pay

## 2024-08-03 ENCOUNTER — Ambulatory Visit: Admitting: Acute Care

## 2024-08-03 DIAGNOSIS — N3000 Acute cystitis without hematuria: Secondary | ICD-10-CM | POA: Diagnosis not present

## 2024-08-03 DIAGNOSIS — R1013 Epigastric pain: Secondary | ICD-10-CM | POA: Diagnosis not present

## 2024-08-03 LAB — CULTURE, OB URINE: Culture: >=100000 — AB

## 2024-08-03 MED ORDER — CHLORHEXIDINE GLUCONATE CLOTH 2 % EX PADS
6.0000 | MEDICATED_PAD | Freq: Every day | CUTANEOUS | Status: DC
  Administered 2024-08-03: 6 via TOPICAL

## 2024-08-03 MED ORDER — SEVELAMER CARBONATE 800 MG PO TABS
2400.0000 mg | ORAL_TABLET | Freq: Three times a day (TID) | ORAL | 3 refills | Status: AC
  Filled 2024-08-03: qty 270, 30d supply, fill #0

## 2024-08-03 MED ORDER — CYANOCOBALAMIN 1000 MCG PO TABS
1000.0000 ug | ORAL_TABLET | Freq: Every day | ORAL | 2 refills | Status: AC
  Filled 2024-08-03: qty 30, 30d supply, fill #0

## 2024-08-03 MED ORDER — CIPROFLOXACIN HCL 500 MG PO TABS
500.0000 mg | ORAL_TABLET | Freq: Every day | ORAL | 0 refills | Status: AC
  Filled 2024-08-03: qty 3, 3d supply, fill #0

## 2024-08-03 MED ORDER — CIPROFLOXACIN HCL 500 MG PO TABS
500.0000 mg | ORAL_TABLET | Freq: Every day | ORAL | Status: DC
  Administered 2024-08-03: 500 mg via ORAL
  Filled 2024-08-03: qty 1

## 2024-08-03 MED ORDER — SEVELAMER CARBONATE 800 MG PO TABS: 2400.0000 mg | ORAL_TABLET | Freq: Three times a day (TID) | ORAL | Status: DC

## 2024-08-03 NOTE — Progress Notes (Signed)
 Patient insisted he will do the CHG bath by himself without assistance when he gets up in the morning. Nurse folded it in a towel with new gown and place within his reach. Wil update day shift staff to make sure, it gets done.

## 2024-08-03 NOTE — Progress Notes (Signed)
 Patient ID: Manuel Ortega, male   DOB: 1951-07-18, 73 y.o.   MRN: [id number] [facility] Progress Note   Assessment/ Plan:   1.  Abdominal pain with associated nausea and vomiting: CT scan of the abdomen did not show any acute findings pointing to etiology.  Improving with symptomatic/supportive management as well as treatment of urinary tract infection-now on oral ciprofloxacin . 2. ESRD: Typically on TTS hemodialysis schedule and underwent uneventful hemodialysis yesterday.  Will order for hemodialysis again tomorrow (in case he is still here). 3. Anemia: Poorly explained downward trend of hemoglobin/hematocrit seen since admission (down by almost 4 g) without overt blood loss.  Given his history of peptic ulcer disease, may need to consider FOBT testing. 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 5. Nutrition: Tolerating current diet without any problems, reminded about importance of following renal diet/fluid restriction. 6. Hypertension: Blood pressure currently at goal, continue to monitor with hemodialysis.  Not on any pharmacological therapy.  Subjective:   Continues to have intermittent suprapubic pain.  Distinctly denies any hematochezia or melena.   Objective:   BP 112/63 (BP Location: Right Arm)   Pulse 70   Temp 98.9 F (37.2 C) (Oral)   Resp 18   Ht 6' 1 (1.854 m)   Wt 95.6 kg   SpO2 96%   BMI 27.81 kg/m   Physical Exam: Gen: Comfortably sitting up on the edge of his bed, nurse administering medications to him CVS: Pulse regular rhythm, normal rate, S1 and S2 normal Resp: Clear to auscultation bilaterally, no distinct rales or rhonchi.  Right IJ TDC in place Abd: Soft, mildly distended, some suprapubic tenderness present Ext: No lower extremity edema.  Labs: BMET Recent Labs  Lab 07/28/24 1622 07/29/24 0500 07/30/24 1635 08/01/24 0301 08/02/24 0750  NA 138 137 136 135 136   K 3.8 3.6 3.4* 3.7 4.6  CL 93* 95* 100 98 102  CO2 26 29 24  20* 21*  GLUCOSE 106* 93 98 154* 86  BUN 21 27* 22 55* 66*  CREATININE 5.07* 6.04* 5.08* 7.53* 8.67*  CALCIUM  10.4* 9.8 9.1 8.5* 9.0  PHOS  --  4.0 3.2 4.1 6.2*   CBC Recent Labs  Lab 07/28/24 1622 07/29/24 0500 07/30/24 1635 08/01/24 0301 08/02/24 0750  WBC 8.5 7.5 7.9 6.5 7.0  NEUTROABS 5.5 4.4 4.7 5.0  --   HGB 13.4 12.4* 11.6* 10.6* 9.9*  HCT 41.8 38.0* 34.6* 33.4* 31.1*  MCV 90.9 92.5 91.1 94.1 94.0  PLT 226 196 204 191 170      Medications:     Chlorhexidine  Gluconate Cloth  6 each Topical Q0600   Chlorhexidine  Gluconate Cloth  6 each Topical Q0600   cinacalcet   30 mg Oral Q T,Th,Sa-HD   ciprofloxacin   500 mg Oral Daily   vitamin B-12  1,000 mcg Oral Daily   famotidine  20 mg Oral Daily   latanoprost   1 drop Both Eyes QHS   lipase/protease/amylase  72,000 Units Oral TID WC   pantoprazole  (PROTONIX ) IV  40 mg Intravenous Q12H   QUEtiapine   150 mg Oral QHS   sertraline   100 mg Oral Daily   sevelamer  carbonate  1,600 mg Oral TID WC   timolol   1 drop Both Eyes q morning   Gordy Blanch, MD 08/03/2024, 9:10 AM

## 2024-08-03 NOTE — Care Management Important Message (Signed)
 Important Message  Patient Details  Name: Manuel Ortega MRN: [id number] Date of Birth: Aug 11, 1951   Important Message Given:  Yes - Medicare IM     Vonzell Arrie Sharps 08/03/2024, 9:35 AM

## 2024-08-03 NOTE — TOC Transition Note (Signed)
 Transition of Care (TOC) - Discharge Note Rayfield Gobble RN, BSN Inpatient Care Management Unit 4E- RN Case Manager See Treatment Team for direct phone #   Patient Details  Name: Manuel Ortega MRN: [id number] Date of Birth: [birth year]  Transition of Care [facility] CM/SW Contact:  Gobble Rayfield Hurst, RN Phone Number: 08/03/2024, 10:11 AM   Clinical Narrative:    Pt stable for transition home today, no HH needs noted. DME- BSC ordered and arranged with [employer] per insurance contract.   Spoke with pt this am- pt voiced he does not need BSC as he is able to walk to bathroom - BSC declined by pt on delivery. No further DME needs noted.  Pt is independent   Pt voiced his car is at [facility] and will need assist to get to [facility] to get his car.  Pt can go to d/c lounge when ready and they can assist pt with transportation needs.   No further IP CM needs noted.    Final next level of care: Home/Self Care Barriers to Discharge: No Barriers Identified   Patient Goals and CMS Choice Patient states their goals for this hospitalization and ongoing recovery are:: return home   Choice offered to / list presented to : Patient      Discharge Placement               Home        Discharge Plan and Services Additional resources added to the After Visit Summary for     Discharge Planning Services: CM Consult Post Acute Care Choice: Durable Medical Equipment            DME Agency: [employer] Date DME Agency Contacted: 08/01/24   Representative spoke with at DME Agency: Darlyn HH Arranged: NA HH Agency: NA        Social Drivers of Health (SDOH) Interventions SDOH Screenings   Food Insecurity: No Food Insecurity (07/29/2024)  Housing: Low Risk  (07/29/2024)  Transportation Needs: No Transportation Needs (07/29/2024)  Utilities: Not At Risk (07/29/2024)  Alcohol Screen: Low Risk  (01/22/2024)  Depression (PHQ2-9): Medium Risk (03/08/2024)  Financial Resource Strain: Low Risk  (11/10/2023)    Received from [facility]  Physical Activity: Sufficiently Active (08/21/2022)   Received from [facility]  Social Connections: Socially Isolated (07/29/2024)  Stress: No Stress Concern Present (02/02/2024)   Received from [facility]  Tobacco Use: Low Risk  (07/29/2024)  Health Literacy: Adequate Health Literacy (01/22/2024)     Readmission Risk Interventions    08/03/2024   10:11 AM 01/18/2024    4:03 PM  Readmission Risk Prevention Plan  Transportation Screening Complete Complete  PCP or Specialist Appt within 5-7 Days  Complete  Home Care Screening  Complete  Medication Review (RN CM)  Referral to Pharmacy  Medication Review (RN Care Manager) Complete   HRI or Home Care Consult Complete   SW Recovery Care/Counseling Consult Complete   Palliative Care Screening Not Applicable   Skilled Nursing Facility Not Applicable

## 2024-08-03 NOTE — Progress Notes (Signed)
 [facility] Progress Note   Subjective:   Seen walking around in the halls and later in his room. Reports he is feeling better but still having some abdominal pain. Denies SOB, CP, dizziness, nausea. Reports occasional unilateral headaches, none at present.   Objective Vitals:   08/02/24 1229 08/02/24 2000 08/03/24 0018 08/03/24 0505  BP: 113/68 104/75 (!) 98/59 112/63  Pulse: 80 75 76 70  Resp: 14 16 18 18   Temp: 98.9 F (37.2 C) 99.3 F (37.4 C) 97.9 F (36.6 C) 98.9 F (37.2 C)  TempSrc: Oral Oral Oral Oral  SpO2: 96% 98% 98% 96%  Weight:    95.6 kg  Height:       Physical Exam General: Alert male in NAD Heart: RRR Lungs: CTA bilaterally, respirations unlabored on RA Abdomen: non-distended Extremities: No edema b/l lower extremities Dialysis Access:  [facility]  Additional Objective Labs: Basic Metabolic Panel: Recent Labs  Lab 07/30/24 1635 08/01/24 0301 08/02/24 0750  NA 136 135 136  K 3.4* 3.7 4.6  CL 100 98 102  CO2 24 20* 21*  GLUCOSE 98 154* 86  BUN 22 55* 66*  CREATININE 5.08* 7.53* 8.67*  CALCIUM  9.1 8.5* 9.0  PHOS 3.2 4.1 6.2*   Liver Function Tests: Recent Labs  Lab 07/28/24 1622 07/29/24 0500 07/30/24 1635 08/01/24 0301 08/02/24 0750  AST 18 14*  --   --   --   ALT 7 8  --   --   --   ALKPHOS 87 74  --   --   --   BILITOT 1.0 0.8  --   --   --   PROT 7.9 6.6  --   --   --   ALBUMIN  4.6 4.1 3.4* 2.9* 2.7*   Recent Labs  Lab 07/28/24 1622  LIPASE 79*   CBC: Recent Labs  Lab 07/28/24 1622 07/29/24 0500 07/30/24 1635 08/01/24 0301 08/02/24 0750  WBC 8.5 7.5 7.9 6.5 7.0  NEUTROABS 5.5 4.4 4.7 5.0  --   HGB 13.4 12.4* 11.6* 10.6* 9.9*  HCT 41.8 38.0* 34.6* 33.4* 31.1*  MCV 90.9 92.5 91.1 94.1 94.0  PLT 226 196 204 191 170   Blood Culture    Component Value Date/Time   SDES BLOOD LEFT ARM 07/31/2024 1405   SPECREQUEST  07/31/2024 1405    BOTTLES DRAWN AEROBIC AND ANAEROBIC Blood Culture adequate volume   CULT   07/31/2024 1405    NO GROWTH 3 DAYS Performed at [facility], [address] [place], [place] [address]    REPTSTATUS PENDING 07/31/2024 1405    Cardiac Enzymes: No results for input(s): CKTOTAL, CKMB, CKMBINDEX, TROPONINI in the last 168 hours. CBG: Recent Labs  Lab 07/28/24 1430  GLUCAP 107*   Iron  Studies:  Recent Labs    08/02/24 0313  IRON  27*  TIBC 165*  FERRITIN 922*   @[email or web address] @ Studies/Results: No results found. Medications:  sodium chloride       Chlorhexidine  Gluconate Cloth  6 each Topical Q0600   Chlorhexidine  Gluconate Cloth  6 each Topical Q0600   cinacalcet   30 mg Oral Q T,Th,Sa-HD   ciprofloxacin   500 mg Oral Daily   vitamin B-12  1,000 mcg Oral Daily   famotidine  20 mg Oral Daily   latanoprost   1 drop Both Eyes QHS   lipase/protease/amylase  72,000 Units Oral TID WC   pantoprazole  (PROTONIX ) IV  40 mg Intravenous Q12H   QUEtiapine   150 mg Oral QHS  sertraline   100 mg Oral Daily   sevelamer  carbonate  1,600 mg Oral TID WC   timolol   1 drop Both Eyes q morning    Dialysis Orders:  TTS NW 4h  B400  89.5kg   3K bath  RIJ TDC  Heparin  5000 Last OP HD 10/16, post wt 89kg Usually gets to the dry wt Dry wt lowered recently Goes to all sessions, signs off 15-30 min early 1/2 the time  Assessment/Plan: Abdominal pain w/vomiting: Improving.  CT abdomen with no acute findings. H/o chronic pancreatitis and PUD. Now being treated for UTI and feeling better.   Lightheaded/near syncope - resolved per patient.  ECHO completed showing improvement in EF to 65-70% and no other significant changes from prior.  ESRD: on HD TTS. No missed HD. Next HD on 08/04/24.  HTN/vol: BP in goal.  Not on medication.  Got 2 L LR on admission. Does not appear overloaded, still makes urine.  UF as tolerated.  Anemia of esrd: Hb 9.9, possible d/c today. Will follow and start ESA outpt as indicated Secondary hyperparathyroidism - Calcium  and phos in goal.   Continue home meds.  Not on VDRA.  Nutrition - on regular diet. Monitor labs.     Lucie Collet, PA-C 08/03/2024, 9:12 AM  [facility] Pager: [phone]

## 2024-08-03 NOTE — Progress Notes (Signed)
 D/c noted. Contacted out-pt HD clinic, [facility], to inform of pt d/c and anticipated arrival back at clinic tomorrow. No further support needed.   Dafina Suk [facility] [phone]

## 2024-08-03 NOTE — Discharge Summary (Signed)
 Physician Discharge Summary   Manuel Ortega FMW:[id number] DOB: 18-Nov-1950 DOA: 07/28/2024  PCP: Manuel Greig PARAS, NP  Admit date: 07/28/2024 Discharge date: 08/03/2024  Admitted From: Home  Disposition:  Home  Discharging physician: Alm Apo, MD Barriers to discharge: none   Discharge Condition: stable CODE STATUS: Full  Diet recommendation:  Diet Orders (From admission, onward)     Start     Ordered   07/29/24 0607  Diet regular Fluid consistency: Thin  Diet effective now       Question:  Fluid consistency:  Answer:  Thin   07/29/24 0606            Hospital Course: [name] is a 73 year old African-American male with a past medical history significant for peptic ulcer disease, chronic pancreatitis, ESRD on HD. Patient presented to the hospital with epigastric pain, associated with recurrent nausea and 3-4 daily episodes of nonbloody nonbilious emesis.  Additionally, patient endorsed dyspnea and lightheadness with him feeling like he was going to pass out.  CT of the chest/abdomen/pelvis were done and showed mild to moderate compression fractures at T10 and T12 which were new from prior and possibly acute but otherwise, no acute findings in the chest, abdomen, or pelvis.   Patient was placed on IV PPI twice daily as well as famotidine and given antiemetics and pain medications.  Patient was transferred to [facility] given his ESRD and need for hemodialysis.  He developed fever after admission and was started on antibiotics and underwent urine and blood cultures.    Assessment & Plan:   Fever UTI -Oddly, patient endorsing to me today that pain on admission was lower middle abdomen pointing to location of suprapubic area; notes indicate epigastric pain on admission -Given suprapubic discomfort and urinalysis concerning for UTI, possible this was his pain complaints, at least in part - continue rocephin ; d/c vanc - Urine culture growing Kleb pneumo and oxytoca;  sensitivities reviewed.  Transitioned to ciprofloxacin  to complete course - Blood cultures remaining negative from 07/31/2024   Abdominal pain -See reasoning above but also was indicating epigastric pain on admission -Now has overall improved - Continue Protonix    Dyspnea: - Resolved significantly. - Echo revealed normal left ventricular ejection fraction and indeterminate diastolic filling due to E-A fusion.   Lightheadedness/Near Syncope -resolved  ESRD on HD - HD per nephrology - underwent HD 10/21; next session 10/23  Chronic Pancreatitis - Lipase Level was 79 - continue creon    Hypercalcemia - resolved    T10/T12 Compression Fx -Asymptomatic. - Noted on CT Chest/Abd/Pelvis and noted to be Mild to Moderate and new and possibly acute.  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 On day of discharge, patient was felt deemed stable for discharge. Patient/family member advised to call PCP or come back to ER if needed.   Principal Diagnosis: Epigastric pain  Discharge Diagnoses: Active Hospital Problems   Diagnosis Date Noted   Epigastric pain 07/29/2024    Priority: 1.   Abdominal pain 07/29/2024    Priority: 2.  UTI (urinary tract infection) 06/12/2020    Priority: 2.   ESRD on dialysis (HCC) 10/31/2020    Priority: 3.   Hypercalcemia 07/29/2024   Elevated troponin 07/29/2024   Compression fracture of T10 vertebra (HCC) 07/29/2024   T12 compression fracture (HCC) 07/29/2024   Chronic pancreatitis (HCC) 07/29/2024    Depression 06/11/2020    Resolved Hospital Problems  No resolved problems to display.     Discharge Instructions     Increase activity slowly   Complete by: As directed       Allergies as of 08/03/2024   No Known Allergies      Medication List     STOP taking these medications    amLODipine  5 MG tablet Commonly known as: NORVASC    pantoprazole  40 MG tablet Commonly known as: PROTONIX        TAKE these medications    calcitRIOL  0.25 MCG capsule Commonly known as: ROCALTROL  Take 5 capsules (1.25 mcg total) by mouth Every Tuesday,Thursday,and Saturday with dialysis.   cinacalcet  30 MG tablet Commonly known as: SENSIPAR  Take 1 tablet (30 mg total) by mouth Every Tuesday,Thursday,and Saturday with dialysis.   ciprofloxacin  500 MG tablet Commonly known as: CIPRO  Take 1 tablet (500 mg total) by mouth daily for 3 days. Start taking on: August 04, 2024   Creon  36000-114000 units Cpep capsule Generic drug: lipase/protease/amylase Take 36,000-72,000 Units by mouth See admin instructions. Take 72,000 units by mouth three time a day with meals and 36,000 units three times a day with snacks   cyanocobalamin  1000 MCG tablet Commonly known as: VITAMIN B12 Take 1 tablet (1,000 mcg total) by mouth daily.   diphenhydrAMINE  25 mg capsule Commonly known as: BENADRYL  Take 25 mg by mouth every 4 (four) hours as needed for itching or allergies.   ferrous sulfate  325 (65 FE) MG EC tablet Take 325 mg by mouth daily with breakfast.   Lumigan  0.01 % Soln Generic drug: bimatoprost  Place 1 drop into both eyes at bedtime.   MIRCERA IJ 30 mcg.   mirtazapine  15 MG tablet Commonly known as: REMERON  Take 15 mg by mouth daily.   montelukast  10 MG tablet Commonly known as: SINGULAIR  Take 10 mg by mouth daily.   QUEtiapine  Fumarate 150 MG 24 hr tablet Commonly known as: SEROQUEL  XR Take 300 mg by mouth at bedtime.   sertraline  100 MG tablet Commonly known as: ZOLOFT  Take  1 tablet (100 mg total) by mouth in the morning and at bedtime. What changed: when to take this   sevelamer  carbonate 800 MG tablet Commonly known as: RENVELA  Take 3 tablets (2,400 mg total) by mouth 3 (three) times daily with meals. What changed:  how much to take when to take this   sildenafil 100 MG tablet Commonly known as: VIAGRA Take 100 mg by mouth as needed for erectile dysfunction.   timolol  0.5 % ophthalmic solution Commonly known as: TIMOPTIC  Place 1 drop into both eyes every morning.   Travoprost  (BAK Free) 0.004 % Soln ophthalmic solution Commonly known as: TRAVATAN  Place 1 drop into both eyes at bedtime.   TYLENOL  500 MG tablet Generic drug: acetaminophen  Take 500-1,000 mg by mouth every 6 (six) hours as needed for mild pain (pain score 1-3) or headache.               Durable Medical Equipment  (From admission, onward)           Start     Ordered   07/31/24 0825  For  home use only DME Bedside commode  Once       Comments: Patient in a room without a bathroom.  Question:  Patient needs a bedside commode to treat with the following condition  Answer:  Weakness   07/31/24 0832            Follow-up Information     [facility] Follow up.   Why: (Adapt) Bedside commode arranged- to be delivered to pt prior to discharge Contact information: [address] [place] [address] [phone]                No Known Allergies  Consultations: Nephrology   Procedures:   Discharge Exam: BP 112/63 (BP Location: Right Arm)   Pulse 70   Temp 98.9 F (37.2 C) (Oral)   Resp 18   Ht 6' 1 (1.854 m)   Wt 95.6 kg   SpO2 96%   BMI 27.81 kg/m  Physical Exam Constitutional:      General: He is not in acute distress.    Appearance: Normal appearance.  HENT:     Head: Normocephalic and atraumatic.     Mouth/Throat:     Mouth: Mucous membranes are moist.  Eyes:     Extraocular Movements: Extraocular movements intact.   Cardiovascular:     Rate and Rhythm: Normal rate and regular rhythm.  Pulmonary:     Effort: Pulmonary effort is normal. No respiratory distress.     Breath sounds: Normal breath sounds. No wheezing.  Abdominal:     General: Bowel sounds are normal. There is no distension.     Palpations: Abdomen is soft.     Tenderness: There is no abdominal tenderness.  Musculoskeletal:        General: Normal range of motion.     Cervical back: Normal range of motion and neck supple.  Skin:    General: Skin is warm and dry.  Neurological:     General: No focal deficit present.     Mental Status: He is alert.  Psychiatric:        Mood and Affect: Mood normal.        Behavior: Behavior normal.      The results of significant diagnostics from this hospitalization (including imaging, microbiology, ancillary and laboratory) are listed below for reference.   Microbiology: Recent Results (from the past 240 hours)  Culture, OB Urine     Status: Abnormal   Collection Time: 07/31/24 11:41 AM   Specimen: Urine, Random  Result Value Ref Range Status   Specimen Description URINE, RANDOM  Final   Special Requests NONE  Final   Culture (A)  Final    >=100,000 COLONIES/mL KLEBSIELLA PNEUMONIAE 70,000 COLONIES/mL KLEBSIELLA OXYTOCA NO GROUP B STREP (S.AGALACTIAE) ISOLATED Performed at [facility], [address], [place], [place] [address]    Report Status 08/03/2024 FINAL  Final   Organism ID, Bacteria KLEBSIELLA PNEUMONIAE (A)  Final   Organism ID, Bacteria KLEBSIELLA OXYTOCA (A)  Final      Susceptibility   Klebsiella oxytoca - MIC*    AMPICILLIN RESISTANT Resistant     CEFEPIME  <=0.12 SENSITIVE Sensitive     ERTAPENEM <=0.12 SENSITIVE Sensitive     CEFTRIAXONE  <=0.25 SENSITIVE Sensitive     CIPROFLOXACIN  <=0.06 SENSITIVE Sensitive     GENTAMICIN <=1 SENSITIVE Sensitive     NITROFURANTOIN <=16 SENSITIVE Sensitive     TRIMETH/SULFA <=20 SENSITIVE Sensitive     AMPICILLIN/SULBACTAM <=2  SENSITIVE Sensitive  PIP/TAZO Value in next row Sensitive      <=4 SENSITIVEThis is a modified FDA-approved test that has been validated and its performance characteristics determined by the reporting laboratory.  This laboratory is certified under the Clinical Laboratory Improvement Amendments CLIA as qualified to perform high complexity clinical laboratory testing.    MEROPENEM Value in next row Sensitive      <=4 SENSITIVEThis is a modified FDA-approved test that has been validated and its performance characteristics determined by the reporting laboratory.  This laboratory is certified under the Clinical Laboratory Improvement Amendments CLIA as qualified to perform high complexity clinical laboratory testing.    * 70,000 COLONIES/mL KLEBSIELLA OXYTOCA   Klebsiella pneumoniae - MIC*    AMPICILLIN Value in next row Resistant      <=4 SENSITIVEThis is a modified FDA-approved test that has been validated and its performance characteristics determined by the reporting laboratory.  This laboratory is certified under the Clinical Laboratory Improvement Amendments CLIA as qualified to perform high complexity clinical laboratory testing.    CEFAZOLIN  (URINE) Value in next row Sensitive      2 SENSITIVEThis is a modified FDA-approved test that has been validated and its performance characteristics determined by the reporting laboratory.  This laboratory is certified under the Clinical Laboratory Improvement Amendments CLIA as qualified to perform high complexity clinical laboratory testing.    CEFEPIME  Value in next row Sensitive      2 SENSITIVEThis is a modified FDA-approved test that has been validated and its performance characteristics determined by the reporting laboratory.  This laboratory is certified under the Clinical Laboratory Improvement Amendments CLIA as qualified to perform high complexity clinical laboratory testing.    ERTAPENEM Value in next row Sensitive      2 SENSITIVEThis is a  modified FDA-approved test that has been validated and its performance characteristics determined by the reporting laboratory.  This laboratory is certified under the Clinical Laboratory Improvement Amendments CLIA as qualified to perform high complexity clinical laboratory testing.    CEFTRIAXONE  Value in next row Sensitive      2 SENSITIVEThis is a modified FDA-approved test that has been validated and its performance characteristics determined by the reporting laboratory.  This laboratory is certified under the Clinical Laboratory Improvement Amendments CLIA as qualified to perform high complexity clinical laboratory testing.    CIPROFLOXACIN  Value in next row Sensitive      2 SENSITIVEThis is a modified FDA-approved test that has been validated and its performance characteristics determined by the reporting laboratory.  This laboratory is certified under the Clinical Laboratory Improvement Amendments CLIA as qualified to perform high complexity clinical laboratory testing.    GENTAMICIN Value in next row Sensitive      2 SENSITIVEThis is a modified FDA-approved test that has been validated and its performance characteristics determined by the reporting laboratory.  This laboratory is certified under the Clinical Laboratory Improvement Amendments CLIA as qualified to perform high complexity clinical laboratory testing.    NITROFURANTOIN Value in next row Resistant      2 SENSITIVEThis is a modified FDA-approved test that has been validated and its performance characteristics determined by the reporting laboratory.  This laboratory is certified under the Clinical Laboratory Improvement Amendments CLIA as qualified to perform high complexity clinical laboratory testing.    TRIMETH/SULFA Value in next row Sensitive      2 SENSITIVEThis is a modified FDA-approved test that has been validated and its performance characteristics determined by the reporting laboratory.  This laboratory  is certified under the  Clinical Laboratory Improvement Amendments CLIA as qualified to perform high complexity clinical laboratory testing.    AMPICILLIN/SULBACTAM Value in next row Sensitive      2 SENSITIVEThis is a modified FDA-approved test that has been validated and its performance characteristics determined by the reporting laboratory.  This laboratory is certified under the Clinical Laboratory Improvement Amendments CLIA as qualified to perform high complexity clinical laboratory testing.    PIP/TAZO Value in next row Sensitive      <=4 SENSITIVEThis is a modified FDA-approved test that has been validated and its performance characteristics determined by the reporting laboratory.  This laboratory is certified under the Clinical Laboratory Improvement Amendments CLIA as qualified to perform high complexity clinical laboratory testing.    MEROPENEM Value in next row Sensitive      <=4 SENSITIVEThis is a modified FDA-approved test that has been validated and its performance characteristics determined by the reporting laboratory.  This laboratory is certified under the Clinical Laboratory Improvement Amendments CLIA as qualified to perform high complexity clinical laboratory testing.    * >=100,000 COLONIES/mL KLEBSIELLA PNEUMONIAE  Culture, blood (Routine X 2) w Reflex to ID Panel     Status: None (Preliminary result)   Collection Time: 07/31/24  2:02 PM   Specimen: BLOOD LEFT HAND  Result Value Ref Range Status   Specimen Description BLOOD LEFT HAND  Final   Special Requests   Final    BOTTLES DRAWN AEROBIC AND ANAEROBIC Blood Culture results may not be optimal due to an inadequate volume of blood received in culture bottles   Culture   Final    NO GROWTH 3 DAYS Performed at [facility], [address], [place], [place] [address]    Report Status PENDING  Incomplete  Culture, blood (Routine X 2) w Reflex to ID Panel     Status: None (Preliminary result)   Collection Time: 07/31/24  2:05 PM   Specimen:  BLOOD LEFT ARM  Result Value Ref Range Status   Specimen Description BLOOD LEFT ARM  Final   Special Requests   Final    BOTTLES DRAWN AEROBIC AND ANAEROBIC Blood Culture adequate volume   Culture   Final    NO GROWTH 3 DAYS Performed at [facility], [address], [place], [place] [address]    Report Status PENDING  Incomplete     Labs: BNP (last 3 results) No results for input(s): BNP in the last 8760 hours. Basic Metabolic Panel: Recent Labs  Lab 07/28/24 1622 07/29/24 0500 07/30/24 1635 08/01/24 0301 08/02/24 0750  NA 138 137 136 135 136  K 3.8 3.6 3.4* 3.7 4.6  CL 93* 95* 100 98 102  CO2 26 29 24  20* 21*  GLUCOSE 106* 93 98 154* 86  BUN 21 27* 22 55* 66*  CREATININE 5.07* 6.04* 5.08* 7.53* 8.67*  CALCIUM  10.4* 9.8 9.1 8.5* 9.0  MG  --  2.1  --   --   --   PHOS  --  4.0 3.2 4.1 6.2*   Liver Function Tests: Recent Labs  Lab 07/28/24 1622 07/29/24 0500 07/30/24 1635 08/01/24 0301 08/02/24 0750  AST 18 14*  --   --   --   ALT 7 8  --   --   --   ALKPHOS 87 74  --   --   --   BILITOT 1.0 0.8  --   --   --   PROT 7.9 6.6  --   --   --  ALBUMIN  4.6 4.1 3.4* 2.9* 2.7*   Recent Labs  Lab 07/28/24 1622  LIPASE 79*   No results for input(s): AMMONIA in the last 168 hours. CBC: Recent Labs  Lab 07/28/24 1622 07/29/24 0500 07/30/24 1635 08/01/24 0301 08/02/24 0750  WBC 8.5 7.5 7.9 6.5 7.0  NEUTROABS 5.5 4.4 4.7 5.0  --   HGB 13.4 12.4* 11.6* 10.6* 9.9*  HCT 41.8 38.0* 34.6* 33.4* 31.1*  MCV 90.9 92.5 91.1 94.1 94.0  PLT 226 196 204 191 170   Cardiac Enzymes: No results for input(s): CKTOTAL, CKMB, CKMBINDEX, TROPONINI in the last 168 hours. BNP: Invalid input(s): POCBNP CBG: Recent Labs  Lab 07/28/24 1430  GLUCAP 107*   D-Dimer No results for input(s): DDIMER in the last 72 hours. Hgb A1c No results for input(s): HGBA1C in the last 72 hours. Lipid Profile No results for input(s): CHOL, HDL, LDLCALC, TRIG,  CHOLHDL, LDLDIRECT in the last 72 hours. Thyroid function studies No results for input(s): TSH, T4TOTAL, T3FREE, THYROIDAB in the last 72 hours.  Invalid input(s): FREET3 Anemia work up Recent Labs    08/02/24 0313  VITAMINB12 200  FOLATE 9.4  FERRITIN 922*  TIBC 165*  IRON  27*   Urinalysis    Component Value Date/Time   COLORURINE YELLOW 07/31/2024 1140   APPEARANCEUR CLEAR 07/31/2024 1140   LABSPEC 1.009 07/31/2024 1140   PHURINE 9.0 (H) 07/31/2024 1140   GLUCOSEU NEGATIVE 07/31/2024 1140   HGBUR NEGATIVE 07/31/2024 1140   BILIRUBINUR NEGATIVE 07/31/2024 1140   KETONESUR NEGATIVE 07/31/2024 1140   PROTEINUR 100 (A) 07/31/2024 1140   NITRITE NEGATIVE 07/31/2024 1140   LEUKOCYTESUR NEGATIVE 07/31/2024 1140   Sepsis Labs Recent Labs  Lab 07/29/24 0500 07/30/24 1635 08/01/24 0301 08/02/24 0750  WBC 7.5 7.9 6.5 7.0   Microbiology Recent Results (from the past 240 hours)  Culture, OB Urine     Status: Abnormal   Collection Time: 07/31/24 11:41 AM   Specimen: Urine, Random  Result Value Ref Range Status   Specimen Description URINE, RANDOM  Final   Special Requests NONE  Final   Culture (A)  Final    >=100,000 COLONIES/mL KLEBSIELLA PNEUMONIAE 70,000 COLONIES/mL KLEBSIELLA OXYTOCA NO GROUP B STREP (S.AGALACTIAE) ISOLATED Performed at [facility], [address], [place], [place] [address]    Report Status 08/03/2024 FINAL  Final   Organism ID, Bacteria KLEBSIELLA PNEUMONIAE (A)  Final   Organism ID, Bacteria KLEBSIELLA OXYTOCA (A)  Final      Susceptibility   Klebsiella oxytoca - MIC*    AMPICILLIN RESISTANT Resistant     CEFEPIME  <=0.12 SENSITIVE Sensitive     ERTAPENEM <=0.12 SENSITIVE Sensitive     CEFTRIAXONE  <=0.25 SENSITIVE Sensitive     CIPROFLOXACIN  <=0.06 SENSITIVE Sensitive     GENTAMICIN <=1 SENSITIVE Sensitive     NITROFURANTOIN <=16 SENSITIVE Sensitive     TRIMETH/SULFA <=20 SENSITIVE Sensitive     AMPICILLIN/SULBACTAM <=2  SENSITIVE Sensitive     PIP/TAZO Value in next row Sensitive      <=4 SENSITIVEThis is a modified FDA-approved test that has been validated and its performance characteristics determined by the reporting laboratory.  This laboratory is certified under the Clinical Laboratory Improvement Amendments CLIA as qualified to perform high complexity clinical laboratory testing.    MEROPENEM Value in next row Sensitive      <=4 SENSITIVEThis is a modified FDA-approved test that has been validated and its performance characteristics determined by the reporting laboratory.  This laboratory  is certified under the Clinical Laboratory Improvement Amendments CLIA as qualified to perform high complexity clinical laboratory testing.    * 70,000 COLONIES/mL KLEBSIELLA OXYTOCA   Klebsiella pneumoniae - MIC*    AMPICILLIN Value in next row Resistant      <=4 SENSITIVEThis is a modified FDA-approved test that has been validated and its performance characteristics determined by the reporting laboratory.  This laboratory is certified under the Clinical Laboratory Improvement Amendments CLIA as qualified to perform high complexity clinical laboratory testing.    CEFAZOLIN  (URINE) Value in next row Sensitive      2 SENSITIVEThis is a modified FDA-approved test that has been validated and its performance characteristics determined by the reporting laboratory.  This laboratory is certified under the Clinical Laboratory Improvement Amendments CLIA as qualified to perform high complexity clinical laboratory testing.    CEFEPIME  Value in next row Sensitive      2 SENSITIVEThis is a modified FDA-approved test that has been validated and its performance characteristics determined by the reporting laboratory.  This laboratory is certified under the Clinical Laboratory Improvement Amendments CLIA as qualified to perform high complexity clinical laboratory testing.    ERTAPENEM Value in next row Sensitive      2 SENSITIVEThis is a  modified FDA-approved test that has been validated and its performance characteristics determined by the reporting laboratory.  This laboratory is certified under the Clinical Laboratory Improvement Amendments CLIA as qualified to perform high complexity clinical laboratory testing.    CEFTRIAXONE  Value in next row Sensitive      2 SENSITIVEThis is a modified FDA-approved test that has been validated and its performance characteristics determined by the reporting laboratory.  This laboratory is certified under the Clinical Laboratory Improvement Amendments CLIA as qualified to perform high complexity clinical laboratory testing.    CIPROFLOXACIN  Value in next row Sensitive      2 SENSITIVEThis is a modified FDA-approved test that has been validated and its performance characteristics determined by the reporting laboratory.  This laboratory is certified under the Clinical Laboratory Improvement Amendments CLIA as qualified to perform high complexity clinical laboratory testing.    GENTAMICIN Value in next row Sensitive      2 SENSITIVEThis is a modified FDA-approved test that has been validated and its performance characteristics determined by the reporting laboratory.  This laboratory is certified under the Clinical Laboratory Improvement Amendments CLIA as qualified to perform high complexity clinical laboratory testing.    NITROFURANTOIN Value in next row Resistant      2 SENSITIVEThis is a modified FDA-approved test that has been validated and its performance characteristics determined by the reporting laboratory.  This laboratory is certified under the Clinical Laboratory Improvement Amendments CLIA as qualified to perform high complexity clinical laboratory testing.    TRIMETH/SULFA Value in next row Sensitive      2 SENSITIVEThis is a modified FDA-approved test that has been validated and its performance characteristics determined by the reporting laboratory.  This laboratory is certified under the  Clinical Laboratory Improvement Amendments CLIA as qualified to perform high complexity clinical laboratory testing.    AMPICILLIN/SULBACTAM Value in next row Sensitive      2 SENSITIVEThis is a modified FDA-approved test that has been validated and its performance characteristics determined by the reporting laboratory.  This laboratory is certified under the Clinical Laboratory Improvement Amendments CLIA as qualified to perform high complexity clinical laboratory testing.    PIP/TAZO Value in next row Sensitive      <=4  SENSITIVEThis is a modified FDA-approved test that has been validated and its performance characteristics determined by the reporting laboratory.  This laboratory is certified under the Clinical Laboratory Improvement Amendments CLIA as qualified to perform high complexity clinical laboratory testing.    MEROPENEM Value in next row Sensitive      <=4 SENSITIVEThis is a modified FDA-approved test that has been validated and its performance characteristics determined by the reporting laboratory.  This laboratory is certified under the Clinical Laboratory Improvement Amendments CLIA as qualified to perform high complexity clinical laboratory testing.    * >=100,000 COLONIES/mL KLEBSIELLA PNEUMONIAE  Culture, blood (Routine X 2) w Reflex to ID Panel     Status: None (Preliminary result)   Collection Time: 07/31/24  2:02 PM   Specimen: BLOOD LEFT HAND  Result Value Ref Range Status   Specimen Description BLOOD LEFT HAND  Final   Special Requests   Final    BOTTLES DRAWN AEROBIC AND ANAEROBIC Blood Culture results may not be optimal due to an inadequate volume of blood received in culture bottles   Culture   Final    NO GROWTH 3 DAYS Performed at [facility], [address], [place], [place] [address]    Report Status PENDING  Incomplete  Culture, blood (Routine X 2) w Reflex to ID Panel     Status: None (Preliminary result)   Collection Time: 07/31/24  2:05 PM   Specimen:  BLOOD LEFT ARM  Result Value Ref Range Status   Specimen Description BLOOD LEFT ARM  Final   Special Requests   Final    BOTTLES DRAWN AEROBIC AND ANAEROBIC Blood Culture adequate volume   Culture   Final    NO GROWTH 3 DAYS Performed at [facility], [address], [place], [place] [address]    Report Status PENDING  Incomplete    Procedures/Studies: DG CHEST PORT 1 VIEW Result Date: 07/31/2024 EXAM: 1 VIEW(S) XRAY OF THE CHEST 07/31/2024 09:39:00 PM COMPARISON: 02/05/2024 CLINICAL HISTORY: Fever FINDINGS: LINES, TUBES AND DEVICES: Stable right IJ CVC catheter in place. LUNGS AND PLEURA: No focal pulmonary opacity. No pulmonary edema. No pleural effusion. No pneumothorax. HEART AND MEDIASTINUM: Aortic atherosclerosis. No acute abnormality of the cardiac and mediastinal silhouettes. BONES AND SOFT TISSUES: No acute fracture or destructive lesion. Multilevel thoracic osteophytosis. IMPRESSION: 1. No acute cardiopulmonary findings. Electronically signed by: Norman Gatlin MD 07/31/2024 09:55 PM EDT RP Workstation: [id number]   ECHOCARDIOGRAM COMPLETE Result Date: 07/29/2024    ECHOCARDIOGRAM REPORT   Patient Name:   Manuel Ortega Date of Exam: 07/29/2024 Medical Rec #:  [id number]      Height:       73.0 in Accession #:    [id number]     Weight:       195.0 lb Date of Birth:  [birth year]       BSA:          2.129 m Patient Age:    73 years       BP:           113/60 mmHg Patient Gender: M              HR:           89 bpm. Exam Location:  Inpatient Procedure: 2D Echo, Cardiac Doppler and Color Doppler (Both Spectral and Color            Flow Doppler were utilized during procedure). Indications:    Dyspnea R06.00  History:        Patient has prior history of Echocardiogram examinations, most                 recent 06/25/2023. Risk Factors:Hypertension.  Sonographer:    Jayson Gaskins Referring Phys: [id number] OMAIR LATIF [facility] IMPRESSIONS  1. Left ventricular ejection fraction, by estimation, is 65  to 70%. The left ventricle has hyperdynamic function. The left ventricle has no regional wall motion abnormalities. Indeterminate diastolic filling due to E-A fusion.  2. Right ventricular systolic function is normal. The right ventricular size is normal.  3. The mitral valve is grossly normal. Trivial mitral valve regurgitation.  4. The aortic valve was not well visualized. Aortic valve regurgitation is mild.  5. Cannot exclude a small PFO. Comparison(s): A prior study was performed on 06/25/2023. The ejection fraction was 54% by 3D assessment with mild LVH of the septal segment. No other significant changes. FINDINGS  Left Ventricle: Left ventricular ejection fraction, by estimation, is 65 to 70%. The left ventricle has hyperdynamic function. The left ventricle has no regional wall motion abnormalities. The left ventricular internal cavity size was normal in size. There is borderline left ventricular hypertrophy. Indeterminate diastolic filling due to E-A fusion. Right Ventricle: The right ventricular size is normal. No increase in right ventricular wall thickness. Right ventricular systolic function is normal. Left Atrium: Left atrial size was normal in size. Right Atrium: Right atrial size was normal in size. Pericardium: There is no evidence of pericardial effusion. Mitral Valve: The mitral valve is grossly normal. Trivial mitral valve regurgitation. Tricuspid Valve: The tricuspid valve is grossly normal. Tricuspid valve regurgitation is trivial. Aortic Valve: The aortic valve was not well visualized. Aortic valve regurgitation is mild. Aortic regurgitation PHT measures 575 msec. Aortic valve mean gradient measures 6.5 mmHg. Aortic valve peak gradient measures 11.6 mmHg. Aortic valve area, by VTI  measures 1.78 cm. Pulmonic Valve: The pulmonic valve was grossly normal. Pulmonic valve regurgitation is trivial. Aorta: The aortic root is normal in size and structure. Venous: The inferior vena cava was not well  visualized. IAS/Shunts: Cannot exclude a small PFO.  LEFT VENTRICLE PLAX 2D LVIDd:         3.60 cm   Diastology LVIDs:         2.30 cm   LV e' medial:    7.29 cm/s LV PW:         1.00 cm   LV E/e' medial:  11.5 LV IVS:        1.00 cm   LV e' lateral:   9.14 cm/s LVOT diam:     1.90 cm   LV E/e' lateral: 9.1 LV SV:         52 LV SV Index:   25 LVOT Area:     2.84 cm  RIGHT VENTRICLE RV S prime:     17.70 cm/s TAPSE (M-mode): 2.4 cm LEFT ATRIUM             Index        RIGHT ATRIUM           Index LA Vol (A2C):   29.0 ml 13.62 ml/m  RA Area:     15.30 cm LA Vol (A4C):   42.2 ml 19.83 ml/m  RA Volume:   34.70 ml  16.30 ml/m LA Biplane Vol: 36.3 ml 17.05 ml/m  AORTIC VALVE AV Area (Vmax):    1.62 cm AV Area (Vmean):   1.81 cm AV Area (VTI):  1.78 cm AV Vmax:           170.00 cm/s AV Vmean:          123.000 cm/s AV VTI:            0.295 m AV Peak Grad:      11.6 mmHg AV Mean Grad:      6.5 mmHg LVOT Vmax:         97.30 cm/s LVOT Vmean:        78.700 cm/s LVOT VTI:          0.185 m LVOT/AV VTI ratio: 0.63 AI PHT:            575 msec MITRAL VALVE                TRICUSPID VALVE MV Area (PHT): 3.89 cm     TR Peak grad:   20.2 mmHg MV Decel Time: 195 msec     TR Vmax:        225.00 cm/s MV E velocity: 83.60 cm/s MV A velocity: 111.00 cm/s  SHUNTS MV E/A ratio:  0.75         Systemic VTI:  0.18 m                             Systemic Diam: 1.90 cm Emeline Calender Electronically signed by Emeline Calender Signature Date/Time: 07/29/2024/4:42:59 PM    Final    CT CHEST ABDOMEN PELVIS WO CONTRAST Result Date: 07/28/2024 EXAM: CT CHEST, ABDOMEN AND PELVIS WITHOUT CONTRAST 07/28/2024 06:43:00 PM TECHNIQUE: CT of the chest, abdomen and pelvis was performed without the administration of intravenous contrast. Multiplanar reformatted images are provided for review. Automated exposure control, iterative reconstruction, and/or weight based adjustment of the mA/kV was utilized to reduce the radiation dose to as low as reasonably  achievable. COMPARISON: 01/16/2024 CLINICAL HISTORY: Epigastric/CP, nausea/vomiting, dizziness. Epigastric pain, chest pain, nausea/vomiting, dizziness. FINDINGS: CHEST: MEDIASTINUM AND LYMPH NODES: Heart: Mild 3 vessel coronary atherosclerosis. Pericardium is unremarkable. The central airways are clear. Vessels: Thoracic aortic atherosclerosis. Right IJ dual lumen catheter terminating at the cavoatrial junction. No mediastinal, hilar or axillary lymphadenopathy. LUNGS AND PLEURA: Mild centrilobular and paraseptal emphysematous changes. Small bilateral lower lobe pulmonary nodules measuring up to 5 mm (images 82 and 89), unchanged from 2024, likely benign. No pleural effusion or pneumothorax. ABDOMEN AND PELVIS: LIVER: The liver is unremarkable. GALLBLADDER AND BILE DUCTS: Gallbladder is unremarkable. No biliary ductal dilatation. SPLEEN: No acute abnormality. PANCREAS: Coarse parenchymal calcifications in the uncinate process, likely reflecting sequelae of prior chronic pancreatitis. ADRENAL GLANDS: No acute abnormality. KIDNEYS, URETERS AND BLADDER: Kidneys: Bilateral renal cysts, including a dominant 3.8 cm simple cyst in the left upper pole of the kidney (image 68), benign (Bosniak 1). Per consensus, no follow-up is needed for simple Bosniak type 1 and 2 renal cysts, unless the patient has a malignancy history or risk factors. No stones in the kidneys. No hydronephrosis. No perinephric stranding. No stones in the ureters. No periureteral stranding. Mildly thick-walled, trabeculated bladder, suggesting sequelae of chronic bladder outlet obstruction. GI AND BOWEL: Stomach demonstrates no acute abnormality. Sigmoid diverticulosis, without evidence of diverticulitis. There is no bowel obstruction. Normal appendix (image 87). REPRODUCTIVE ORGANS: Prostatomegaly, status post TURP. PERITONEUM AND RETROPERITONEUM: No ascites. No free air. VASCULATURE: Atherosclerotic calcifications of the abdominal aorta and branch  vessels. ABDOMINAL AND PELVIS LYMPH NODES: No lymphadenopathy. BONES AND SOFT TISSUES: Mild superior endplate compression fracture deformity  at T10 and moderate superior endplate compression fracture deformity at T12 (sagittal image 100), new from prior. Approximately 40 percent loss of height at T12 with minimal retropulsion. Mild degenerative changes of the lower lumbar spine. No focal soft tissue abnormality. IMPRESSION: 1. Mild to moderate compression fractures at T10 and T12, as described above. There are new from prior and possibly acute. 2. Otherwise, not acute findings in the chest, abdomen, or pelvis. 3. Additional stable ancillary findings as above. Electronically signed by: Pinkie Pebbles MD 07/28/2024 07:15 PM EDT RP Workstation: [id number]     Time coordinating discharge: Over 30 minutes    Alm Apo, MD  [facility] 08/03/2024, 3:34 PM

## 2024-08-04 ENCOUNTER — Telehealth: Payer: Self-pay | Admitting: *Deleted

## 2024-08-04 ENCOUNTER — Ambulatory Visit: Payer: Self-pay | Admitting: Family

## 2024-08-04 NOTE — Transitions of Care (Post Inpatient/ED Visit) (Signed)
 08/04/2024  Name: Manuel Ortega MRN: [id number] DOB: [birth year]  Today's TOC FU Call Status: Today's TOC FU Call Status:: Successful TOC FU Call Completed TOC FU Call Complete Date: 08/04/24 Patient's Name and Date of Birth confirmed.  Transition Care Management Follow-up Telephone Call Date of Discharge: 08/03/24 Discharge Facility: [facility]) Type of Discharge: Inpatient Admission Primary Inpatient Discharge Diagnosis:: Epigastric pain How have you been since you were released from the hospital?: Same (Patient reports feeling overwhelmed) Any questions or concerns?: Yes Patient Questions/Concerns:: Patient has questions regarding medications and what each medication treats. Patient Questions/Concerns Addressed: Other: (RNCM attempted to review medications with patient. He was unable to locate his list, became angry and ended the call.)  Items Reviewed: Did you receive and understand the discharge instructions provided?: No (Patient recieved the information, but has not taken the time to review) Medications obtained,verified, and reconciled?: Partial Review Completed Reason for Partial Mediation Review: RNCM attempted to review medications with patient. He was unable to locate his list, became angry and ended the call.  Medications Reviewed Today:Patient declined medication review and ended the call Medications Reviewed Today     Reviewed by Lucky Andrea LABOR, RN (Registered Nurse) on 08/04/24 at [phone]  Med List Status: <None>   Medication Order Taking? Sig Documenting Provider Last Dose Status Informant  calcitRIOL  (ROCALTROL ) 0.25 MCG capsule [id number] No Take 5 capsules (1.25 mcg total) by mouth Every Tuesday,Thursday,and Saturday with dialysis. Elgergawy, Brayton RAMAN, MD 07/28/2024 Morning Active Self, Pharmacy Records  cinacalcet  (SENSIPAR ) 30 MG tablet [id number] No Take 1 tablet (30 mg total) by mouth Every Tuesday,Thursday,and Saturday with dialysis. Elgergawy, Brayton RAMAN, MD  Unknown Active Self, Pharmacy Records  ciprofloxacin  (CIPRO ) 500 MG tablet [id number]  Take 1 tablet (500 mg total) by mouth daily for 3 days. Patsy Lenis, MD  Active   CREON  [phone] units CPEP capsule [id number] No Take 36,000-72,000 Units by mouth See admin instructions. Take 72,000 units by mouth three time a day with meals and 36,000 units three times a day with snacks [provider] 07/27/2024 Evening Active Self, Pharmacy Records           Med Note RENNIS, DUROJAHYE' R   Thu Jul 28, 2024 11:36 PM)    cyanocobalamin  1000 MCG tablet [id number]  Take 1 tablet (1,000 mcg total) by mouth daily. Patsy Lenis, MD  Active   diphenhydrAMINE  (BENADRYL ) 25 mg capsule [id number] No Take 25 mg by mouth every 4 (four) hours as needed for itching or allergies. [provider] Unknown Active Self, Pharmacy Records           Med Note RENNIS, DUROJAHYE' R   Thu Jul 28, 2024 11:39 PM) Only takes during dialysis  ferrous sulfate  325 (65 FE) MG EC tablet [id number]  Take 325 mg by mouth daily with breakfast. [provider]  Active Self, Pharmacy Records  LUMIGAN  0.01 % SOLN [id number] No Place 1 drop into both eyes at bedtime. Lorren Greig PARAS, NP Past Week Active Self, Pharmacy Records  Methoxy PEG-Epoetin Beta [facility] IJ) [id number]  30 mcg. [provider]  Active Self, Pharmacy Records  mirtazapine  (REMERON ) 15 MG tablet [id number] No Take 15 mg by mouth daily.  Patient not taking: Reported on 07/28/2024   [provider] Not Taking Active Self, Pharmacy Records           Med Note [facility], TYELISHA L   Thu Feb 04, 2024  6:13 PM) Patient running out so has been taking scarcely  montelukast  (SINGULAIR ) 10 MG tablet [id number] No Take 10 mg by mouth daily.  Patient not taking: Reported on 07/28/2024   [provider] Not Taking Active Self, Pharmacy Records  QUEtiapine  Fumarate (SEROQUEL  XR) 150 MG 24 hr tablet [id number] No Take 300 mg by mouth  at bedtime. [provider] Past Month Active Self, Pharmacy Records           Med Note EFRAIM, ALFREIDA CROME   Thu Feb 04, 2024  6:11 PM) Patient stated this medication is supposed to be 200mg  but doctor prescribed him 150mg  and he has been taking 2 tablets. Medication running out and needs a refill.  sertraline  (ZOLOFT ) 100 MG tablet [id number] No Take 1 tablet (100 mg total) by mouth in the morning and at bedtime.  Patient taking differently: Take 100 mg by mouth daily.   Lorren Greig PARAS, NP 07/28/2024 Morning Active Self, Pharmacy Records  sevelamer  carbonate (RENVELA ) 800 MG tablet [id number]  Take 3 tablets (2,400 mg total) by mouth 3 (three) times daily with meals. Patsy Lenis, MD  Active   sildenafil (VIAGRA) 100 MG tablet [id number] No Take 100 mg by mouth as needed for erectile dysfunction. [provider] Unknown Active Self, Pharmacy Records  timolol  (TIMOPTIC ) 0.5 % ophthalmic solution [id number] No Place 1 drop into both eyes every morning. Tanda Bleacher, MD Past Week Active Self, Pharmacy Records  Travoprost , BAK Free, (TRAVATAN ) 0.004 % SOLN ophthalmic solution [id number] No Place 1 drop into both eyes at bedtime.  Patient not taking: Reported on 07/28/2024   Tanda Bleacher, MD Not Taking Active Self, Pharmacy Records  TYLENOL  500 MG tablet [id number] No Take 500-1,000 mg by mouth every 6 (six) hours as needed for mild pain (pain score 1-3) or headache. [provider] Unknown Active Self, Pharmacy Records           Med Note RENNIS, DUROJAHYE' R   Thu Jul 28, 2024 11:46 PM) Emily on days he has dialysis  Med List Note Steffi Nian, CPhT 01/16/24 2201): Dialysis Tuesday, Thursday Saturday             Home Care and Equipment/Supplies:    Functional Questionnaire:    Follow up appointments reviewed:    RNCM unable to complete TOC assessment and Medication review. Patient became angry, reported feeling reprimanded after RNCM advised  compliance with Dialysis and ended the call.  Andrea Dimes RN, BSN [place]  [facility] RN Care Manager [phone]

## 2024-08-04 NOTE — Discharge Planning (Signed)
 [facility] Patient Discharge Orders- [facility] CLINIC: [place]  Patient's name: Manuel Ortega Admit/DC Dates: 07/28/2024 - 08/03/2024  Discharge Diagnoses: Fever/UTI   Dyspnea  Aranesp : Given: no   Date and amount of last dose: N/A  Last Hgb: 9.9 PRBC's Given: no Date/# of units: N/A ESA dose for discharge: same dose IV Iron  dose at discharge: none  Heparin  change: no  EDW Change: no New EDW:   [place] Change: no  Access intervention/Change: no Details:  Hectorol/Calcitriol  change: no  Discharge Labs: Calcium  9.0 Phosphorus 6.2 Albumin  2.7 K+ 4.6  IV Antibiotics: no Details:  On Coumadin?: no Last INR: Next INR: Managed By:   OTHER/APPTS/LAB ORDERS:    D/C Meds to be reconciled by nurse after every discharge.  Completed By: Lucie Collet, PA-C 08/04/2024, 9:03 AM  [facility] Pager: [phone]    Reviewed by: MD:______ RN_______

## 2024-08-05 LAB — CULTURE, BLOOD (ROUTINE X 2)
Culture: NO GROWTH
Culture: NO GROWTH
Special Requests: ADEQUATE

## 2024-08-06 DIAGNOSIS — N186 End stage renal disease: Secondary | ICD-10-CM | POA: Diagnosis not present

## 2024-08-06 DIAGNOSIS — Z992 Dependence on renal dialysis: Secondary | ICD-10-CM | POA: Diagnosis not present

## 2024-08-08 ENCOUNTER — Telehealth: Payer: Self-pay | Admitting: Family

## 2024-08-08 ENCOUNTER — Other Ambulatory Visit: Payer: Self-pay | Admitting: Family

## 2024-08-08 DIAGNOSIS — N390 Urinary tract infection, site not specified: Secondary | ICD-10-CM

## 2024-08-08 MED ORDER — CIPROFLOXACIN HCL 500 MG PO TABS: 500.0000 mg | ORAL_TABLET | Freq: Every day | ORAL | 0 refills | Status: AC

## 2024-08-08 NOTE — Telephone Encounter (Signed)
 Pt misplaced medication that was given to him in the hospital. Medications given: cyanocobalamin , sevelamer  carbonate. Pt scheduled for HFU on 11/07. Please advise.

## 2024-08-08 NOTE — Telephone Encounter (Signed)
 Hospital discharge note from 07/28/2024 - 08/03/2024 (6 days) [facility] reviewed. Ciprofloxacin  prescribed per hospital discharge note.

## 2024-08-09 DIAGNOSIS — N2581 Secondary hyperparathyroidism of renal origin: Secondary | ICD-10-CM | POA: Diagnosis not present

## 2024-08-09 DIAGNOSIS — Z992 Dependence on renal dialysis: Secondary | ICD-10-CM | POA: Diagnosis not present

## 2024-08-11 DIAGNOSIS — N186 End stage renal disease: Secondary | ICD-10-CM | POA: Diagnosis not present

## 2024-08-12 DIAGNOSIS — Z992 Dependence on renal dialysis: Secondary | ICD-10-CM | POA: Diagnosis not present

## 2024-08-12 DIAGNOSIS — N186 End stage renal disease: Secondary | ICD-10-CM | POA: Diagnosis not present

## 2024-08-12 DIAGNOSIS — I120 Hypertensive chronic kidney disease with stage 5 chronic kidney disease or end stage renal disease: Secondary | ICD-10-CM | POA: Diagnosis not present

## 2024-08-13 DIAGNOSIS — N2581 Secondary hyperparathyroidism of renal origin: Secondary | ICD-10-CM | POA: Diagnosis not present

## 2024-08-13 DIAGNOSIS — Z992 Dependence on renal dialysis: Secondary | ICD-10-CM | POA: Diagnosis not present

## 2024-08-15 DIAGNOSIS — H2513 Age-related nuclear cataract, bilateral: Secondary | ICD-10-CM | POA: Diagnosis not present

## 2024-08-15 DIAGNOSIS — H401133 Primary open-angle glaucoma, bilateral, severe stage: Secondary | ICD-10-CM | POA: Diagnosis not present

## 2024-08-18 DIAGNOSIS — Z992 Dependence on renal dialysis: Secondary | ICD-10-CM | POA: Diagnosis not present

## 2024-08-18 DIAGNOSIS — Z1211 Encounter for screening for malignant neoplasm of colon: Secondary | ICD-10-CM | POA: Diagnosis not present

## 2024-08-18 DIAGNOSIS — K861 Other chronic pancreatitis: Secondary | ICD-10-CM | POA: Diagnosis not present

## 2024-08-18 DIAGNOSIS — K8689 Other specified diseases of pancreas: Secondary | ICD-10-CM | POA: Diagnosis not present

## 2024-08-18 DIAGNOSIS — R1084 Generalized abdominal pain: Secondary | ICD-10-CM | POA: Diagnosis not present

## 2024-08-18 DIAGNOSIS — K219 Gastro-esophageal reflux disease without esophagitis: Secondary | ICD-10-CM | POA: Diagnosis not present

## 2024-08-18 DIAGNOSIS — R197 Diarrhea, unspecified: Secondary | ICD-10-CM | POA: Diagnosis not present

## 2024-08-18 DIAGNOSIS — N186 End stage renal disease: Secondary | ICD-10-CM | POA: Diagnosis not present

## 2024-08-18 DIAGNOSIS — K862 Cyst of pancreas: Secondary | ICD-10-CM | POA: Diagnosis not present

## 2024-08-18 DIAGNOSIS — N2581 Secondary hyperparathyroidism of renal origin: Secondary | ICD-10-CM | POA: Diagnosis not present

## 2024-08-19 ENCOUNTER — Encounter: Admitting: Family

## 2024-08-19 NOTE — Progress Notes (Signed)
 Erroneous encounter-disregard

## 2024-08-20 DIAGNOSIS — Z992 Dependence on renal dialysis: Secondary | ICD-10-CM | POA: Diagnosis not present

## 2024-08-20 DIAGNOSIS — N186 End stage renal disease: Secondary | ICD-10-CM | POA: Diagnosis not present

## 2024-08-20 DIAGNOSIS — N2581 Secondary hyperparathyroidism of renal origin: Secondary | ICD-10-CM | POA: Diagnosis not present

## 2024-08-22 ENCOUNTER — Other Ambulatory Visit: Payer: Self-pay | Admitting: Family

## 2024-08-22 NOTE — Telephone Encounter (Unsigned)
 Copied from CRM [id number]. Topic: Clinical - Medication Refill >> Aug 22, 2024 12:02 PM Darshell M wrote: Medication:  QUEtiapine  Fumarate (SEROQUEL  XR) 150 MG 24 hr tablet, patient requesting a 90 day supply.  Has the patient contacted their pharmacy? No (Agent: If no, request that the patient contact the pharmacy for the refill. If patient does not wish to contact the pharmacy document the reason why and proceed with request.) (Agent: If yes, when and what did the pharmacy advise?)  This is the patient's preferred pharmacy:  [facility] [address], [place] - [address] [place] [address] Phone: [phone] Fax: [phone]   Is this the correct pharmacy for this prescription? Yes If no, delete pharmacy and type the correct one.   Has the prescription been filled recently? No  Is the patient out of the medication? Yes  Has the patient been seen for an appointment in the last year OR does the patient have an upcoming appointment? Yes  Can we respond through MyChart? Yes  Agent: Please be advised that Rx refills may take up to 3 business days. We ask that you follow-up with your pharmacy.

## 2024-08-23 DIAGNOSIS — N2581 Secondary hyperparathyroidism of renal origin: Secondary | ICD-10-CM | POA: Diagnosis not present

## 2024-08-23 DIAGNOSIS — Z992 Dependence on renal dialysis: Secondary | ICD-10-CM | POA: Diagnosis not present

## 2024-08-24 ENCOUNTER — Other Ambulatory Visit: Payer: Self-pay | Admitting: Family

## 2024-08-24 ENCOUNTER — Ambulatory Visit: Payer: Self-pay

## 2024-08-24 DIAGNOSIS — F32A Depression, unspecified: Secondary | ICD-10-CM

## 2024-08-24 MED ORDER — FLUOXETINE HCL 10 MG PO CAPS: 10.0000 mg | ORAL_CAPSULE | Freq: Every day | ORAL | 0 refills | Status: AC

## 2024-08-24 NOTE — Telephone Encounter (Signed)
 Quetiapine  Fumarate appears as historical medication. Recommendation to follow-up with established Psychiatry (last office visit 05/24/2024). During the interim report to the Emergency Department/Urgent Care/call 911 for immediate medical evaluation.

## 2024-08-24 NOTE — Telephone Encounter (Signed)
 Requested medication (s) are due for refill today: routing for review  Requested medication (s) are on the active medication list: yes  Last refill:  01/16/24  Future visit scheduled: {Yes  Notes to clinic:  Unable to refill per protocol, cannot delegate.      Requested Prescriptions  Pending Prescriptions Disp Refills   QUEtiapine  Fumarate (SEROQUEL  XR) 150 MG 24 hr tablet 30 tablet     Sig: Take 2 tablets (300 mg total) by mouth at bedtime.     Not Delegated - Psychiatry:  Antipsychotics - Second Generation (Atypical) - quetiapine  Failed - 08/24/2024 10:30 AM      Failed - This refill cannot be delegated      Failed - TSH in normal range and within 360 days    No results found for: TSH, POCTSH, TSHREFLEX       Failed - Lipid Panel in normal range within the last 12 months    Cholesterol  Date Value Ref Range Status  05/14/2023 140 0 - 200 mg/dL Final    Comment:    ATP III Classification       Desirable:  < 200 mg/dL               Borderline High:  200 - 239 mg/dL          High:  > = 759 mg/dL   LDL Cholesterol  Date Value Ref Range Status  05/14/2023 72 0 - 99 mg/dL Final   HDL  Date Value Ref Range Status  05/14/2023 45.00 >39.00 mg/dL Final   Triglycerides  Date Value Ref Range Status  05/14/2023 115.0 0.0 - 149.0 mg/dL Final    Comment:    Normal:  <150 mg/dLBorderline High:  150 - 199 mg/dL         Passed - Completed PHQ-2 or PHQ-9 in the last 360 days      Passed - Last BP in normal range    BP Readings from Last 1 Encounters:  08/03/24 112/63         Passed - Last Heart Rate in normal range    Pulse Readings from Last 1 Encounters:  08/03/24 70         Passed - Valid encounter within last 6 months    Recent Outpatient Visits           1 month ago Anxiety and depression   [facility], [place], NP   3 months ago Essential hypertension   [place] [facility], MD   5  months ago Glaucoma of both eyes, unspecified glaucoma type   [facility], [place], NP   6 months ago Hospital discharge follow-up   [facility], Amy J, NP   7 months ago Encounter to establish care   [facility], Amy J, NP              Passed - CBC within normal limits and completed in the last 12 months    WBC  Date Value Ref Range Status  08/02/2024 7.0 4.0 - 10.5 K/uL Final   RBC  Date Value Ref Range Status  08/02/2024 3.31 (L) 4.22 - 5.81 MIL/uL Final   Hemoglobin  Date Value Ref Range Status  08/02/2024 9.9 (L) 13.0 - 17.0 g/dL Final   HCT  Date Value Ref Range  Status  08/02/2024 31.1 (L) 39.0 - 52.0 % Final   MCHC  Date Value Ref Range Status  08/02/2024 31.8 30.0 - 36.0 g/dL Final   [facility]  Date Value Ref Range Status  08/02/2024 29.9 26.0 - 34.0 pg Final   MCV  Date Value Ref Range Status  08/02/2024 94.0 80.0 - 100.0 fL Final   No results found for: PLTCOUNTKUC, LABPLAT, POCPLA RDW  Date Value Ref Range Status  08/02/2024 15.0 11.5 - 15.5 % Final         Passed - CMP within normal limits and completed in the last 12 months    Albumin   Date Value Ref Range Status  08/02/2024 2.7 (L) 3.5 - 5.0 g/dL Final   Alkaline Phosphatase  Date Value Ref Range Status  07/29/2024 74 38 - 126 U/L Final   ALT  Date Value Ref Range Status  07/29/2024 8 0 - 44 U/L Final   AST  Date Value Ref Range Status  07/29/2024 14 (L) 15 - 41 U/L Final   BUN  Date Value Ref Range Status  08/02/2024 66 (H) 8 - 23 mg/dL Final   Calcium   Date Value Ref Range Status  08/02/2024 9.0 8.9 - 10.3 mg/dL Final   Calcium , Ion  Date Value Ref Range Status  10/15/2023 1.14 (L) 1.15 - 1.40 mmol/L Final   CO2  Date Value Ref Range Status  08/02/2024 21 (L) 22 - 32 mmol/L Final   Bicarbonate  Date Value Ref Range Status  05/11/2020 25.4 20.0 - 28.0  mmol/L Final   TCO2  Date Value Ref Range Status  10/15/2023 24 22 - 32 mmol/L Final   Creatinine, Ser  Date Value Ref Range Status  08/02/2024 8.67 (H) 0.61 - 1.24 mg/dL Final   Glucose, Bld  Date Value Ref Range Status  08/02/2024 86 70 - 99 mg/dL Final    Comment:    Glucose reference range applies only to samples taken after fasting for at least 8 hours.   Glucose-Capillary  Date Value Ref Range Status  07/28/2024 107 (H) 70 - 99 mg/dL Final    Comment:    Glucose reference range applies only to samples taken after fasting for at least 8 hours.   Potassium  Date Value Ref Range Status  08/02/2024 4.6 3.5 - 5.1 mmol/L Final   Sodium  Date Value Ref Range Status  08/02/2024 136 135 - 145 mmol/L Final   Total Bilirubin  Date Value Ref Range Status  07/29/2024 0.8 0.0 - 1.2 mg/dL Final   Protein, ur  Date Value Ref Range Status  07/31/2024 100 (A) NEGATIVE mg/dL Final   Total Protein  Date Value Ref Range Status  07/29/2024 6.6 6.5 - 8.1 g/dL Final   GFR calc Af Amer  Date Value Ref Range Status  06/12/2020 7 (L) >60 mL/min Final   GFR, Estimated  Date Value Ref Range Status  08/02/2024 6 (L) >60 mL/min Final    Comment:    (NOTE) Calculated using the CKD-EPI Creatinine Equation (2021)

## 2024-08-24 NOTE — Telephone Encounter (Signed)
-   Fluoxetine  prescribed. Referral to Psychiatry complete. During the interim report to the Emergency Department/Urgent Care/call 911 for immediate medical evaluation. Follow-up with Primary Care. - Please provide additional information regarding patient's referral request to Gastroenterology.

## 2024-08-24 NOTE — Telephone Encounter (Signed)
 FYI Only or Action Required?: Action required by provider: request for appointment, referral request, and update on patient condition.  Patient was last seen in primary care on 06/29/2024 by Jaycee Greig PARAS, NP.  Called Nurse Triage reporting Depression.  Symptoms began a week ago.  Interventions attempted: Nothing.  Symptoms are: gradually worsening.  Triage Disposition: See Physician Within 24 Hours  Patient/caregiver understands and will follow disposition?: Yes    Copied from CRM [id number]. Topic: Clinical - Red Word Triage >> Aug 24, 2024  9:06 AM Myrick T wrote: [facility] that prompted transfer to Nurse Triage: patient said his depression is getting worse and he thinks it's the medication Sertraline  100mg  as it was making him lethargic. Patient wants to get another med. He needs a referral for a GI and therapist within [facility] but will need an appt Reason for Disposition  [1] Depression AND [2] getting worse (e.g., sleeping poorly, less able to do activities of daily living)  Answer Assessment - Initial Assessment Questions Pt states that he decreased his sertraline  to just 100mg  a day in the morning It was making him lethargic. He states it helped him with the depression but he was so lethargic he wasn't doing anything else. He states that he didn't realize he was supposed to be taking 100mg  in the morning and 100mg  at night. He was taking the whole 200mg  in the morning before he decreased it. He states he thinks maybe he needs a different medication. He also would like a referral for a therapist. His was only virtual and he did not like that.   He is trying to switch everything to [place] and would also like a referral sent for GI for a colonoscopy.    1. CONCERN: What happened that made you call today?     Depression increasing 2. DEPRESSION SYMPTOM SCREENING: How are you feeling overall? (e.g., decreased energy, increased sleeping or difficulty sleeping, difficulty  concentrating, feelings of sadness, guilt, hopelessness, or worthlessness)     He states he enjoys sleeping more than being awake.  3. RISK OF HARM - SUICIDAL IDEATION:  Do you ever have thoughts of hurting or killing yourself?  (e.g., yes, no, no but preoccupation with thoughts about death)     no 4. RISK OF HARM - HOMICIDAL IDEATION:  Do you ever have thoughts of hurting or killing someone else?  (e.g., yes, no, no but preoccupation with thoughts about death)     no 5. FUNCTIONAL IMPAIRMENT: How have things been going for you overall? Have you had more difficulty than usual doing your normal daily activities?  (e.g., better, same, worse; self-care, school, work, interactions)     yes 6. SUPPORT: Who is with you now? Who do you live with? Do you have family or friends who you can talk to?      no 7. THERAPIST: Do you have a counselor or therapist? If Yes, ask: What is their name?     No, would like a new therapist.  8. STRESSORS: Has there been any new stress or recent changes in your life?     Complications with daughter  10. OTHER: Do you have any other physical symptoms right now? (e.g., fever)       Just wanting to sleep  Protocols used: Depression-A-AH

## 2024-08-24 NOTE — Telephone Encounter (Signed)
 I called patient and made him aware of pcp recommendations

## 2024-08-25 ENCOUNTER — Ambulatory Visit: Payer: Self-pay

## 2024-08-25 NOTE — Telephone Encounter (Signed)
 FYI Only or Action Required?: Action required by provider: referral request.  Patient was last seen in primary care on 06/29/2024 by Jaycee Greig PARAS, NP.  Called Nurse Triage reporting Referral.  Symptoms began asymptomatic.  Interventions attempted: Other: n/a.  Symptoms are: not reported, declined need for triage.  Triage Disposition: Call PCP Within 24 Hours  Patient/caregiver understands and will follow disposition?: No    Copied from CRM #[id number]. Topic: Clinical - Red Word Triage >> Aug 25, 2024  3:20 PM Victoria B wrote: Patient has severe stomach pain Reason for Disposition  [1] Follow-up call from patient regarding patient's clinical status AND [2] information NON-URGENT    Requesting referral to [facility]  Answer Assessment - Initial Assessment Questions 1. REASON FOR CALL or QUESTION: What is your reason for calling today? or How can I best     Patient called in to request new referral to [facility]. He is currently with [facility] but not happy. He denies current stomach pain and all symptoms to this clinical research associate. He declines triage due to not being ill, requesting change of referral.  Protocols used: PCP Call - No Triage-A-AH

## 2024-08-27 DIAGNOSIS — N2581 Secondary hyperparathyroidism of renal origin: Secondary | ICD-10-CM | POA: Diagnosis not present

## 2024-08-27 DIAGNOSIS — Z992 Dependence on renal dialysis: Secondary | ICD-10-CM | POA: Diagnosis not present

## 2024-08-27 DIAGNOSIS — N186 End stage renal disease: Secondary | ICD-10-CM | POA: Diagnosis not present

## 2024-08-30 DIAGNOSIS — N2581 Secondary hyperparathyroidism of renal origin: Secondary | ICD-10-CM | POA: Diagnosis not present

## 2024-08-30 DIAGNOSIS — Z992 Dependence on renal dialysis: Secondary | ICD-10-CM | POA: Diagnosis not present

## 2024-08-30 DIAGNOSIS — N186 End stage renal disease: Secondary | ICD-10-CM | POA: Diagnosis not present

## 2024-09-01 DIAGNOSIS — N186 End stage renal disease: Secondary | ICD-10-CM | POA: Diagnosis not present

## 2024-09-02 ENCOUNTER — Encounter: Payer: Self-pay | Admitting: Family

## 2024-09-02 ENCOUNTER — Telehealth: Payer: Self-pay | Admitting: Family

## 2024-09-02 ENCOUNTER — Ambulatory Visit: Admitting: Family

## 2024-09-02 VITALS — BP 128/78 | HR 109 | Temp 99.0°F | Resp 16 | Ht 73.0 in | Wt 206.6 lb

## 2024-09-02 DIAGNOSIS — R42 Dizziness and giddiness: Secondary | ICD-10-CM

## 2024-09-02 DIAGNOSIS — K861 Other chronic pancreatitis: Secondary | ICD-10-CM | POA: Diagnosis not present

## 2024-09-02 DIAGNOSIS — Z09 Encounter for follow-up examination after completed treatment for conditions other than malignant neoplasm: Secondary | ICD-10-CM

## 2024-09-02 DIAGNOSIS — M4854XD Collapsed vertebra, not elsewhere classified, thoracic region, subsequent encounter for fracture with routine healing: Secondary | ICD-10-CM | POA: Diagnosis not present

## 2024-09-02 DIAGNOSIS — F418 Other specified anxiety disorders: Secondary | ICD-10-CM

## 2024-09-02 DIAGNOSIS — D649 Anemia, unspecified: Secondary | ICD-10-CM

## 2024-09-02 DIAGNOSIS — R1013 Epigastric pain: Secondary | ICD-10-CM

## 2024-09-02 DIAGNOSIS — N390 Urinary tract infection, site not specified: Secondary | ICD-10-CM | POA: Diagnosis not present

## 2024-09-02 DIAGNOSIS — S22080S Wedge compression fracture of T11-T12 vertebra, sequela: Secondary | ICD-10-CM

## 2024-09-02 DIAGNOSIS — Z1211 Encounter for screening for malignant neoplasm of colon: Secondary | ICD-10-CM

## 2024-09-02 DIAGNOSIS — Z992 Dependence on renal dialysis: Secondary | ICD-10-CM

## 2024-09-02 DIAGNOSIS — N186 End stage renal disease: Secondary | ICD-10-CM

## 2024-09-02 DIAGNOSIS — S22070S Wedge compression fracture of T9-T10 vertebra, sequela: Secondary | ICD-10-CM

## 2024-09-02 DIAGNOSIS — H409 Unspecified glaucoma: Secondary | ICD-10-CM

## 2024-09-02 DIAGNOSIS — R55 Syncope and collapse: Secondary | ICD-10-CM

## 2024-09-02 DIAGNOSIS — R0600 Dyspnea, unspecified: Secondary | ICD-10-CM

## 2024-09-02 DIAGNOSIS — R509 Fever, unspecified: Secondary | ICD-10-CM

## 2024-09-02 DIAGNOSIS — F32A Depression, unspecified: Secondary | ICD-10-CM

## 2024-09-02 DIAGNOSIS — R109 Unspecified abdominal pain: Secondary | ICD-10-CM

## 2024-09-02 NOTE — Progress Notes (Signed)
 Patient ID: Manuel Ortega, male    DOB: 1951/04/07  MRN: [id number]  CC: Hospital Discharge Follow-Up  Subjective: Manuel Ortega is a 73 y.o. male who presents for hospital discharge follow-up.  His concerns today include:  Patient seen on 07/28/2024 - 08/03/2024 (6 days) at [facility] for epigastric pain and additional diagnoses. Denies red flag symptoms. He is doing well on medication regimen, no issues/concerns. States intermittent right eye pain especially if touching/pressing the eye. States he does not have any back-related pain and would like referral to specialist for additional information regarding diagnosis. States he never received call from [facility]. States he does not presently need oxygen therapy but would like to have on standby should he need it. Requests referral to Gastroenterology for routine colon cancer screening. Requests referral to a therapist. He denies thoughts of self-harm, suicidal ideations, homicidal ideations.   Patient Active Problem List   Diagnosis Date Noted   Epigastric pain 07/29/2024   Hypercalcemia 07/29/2024   Elevated troponin 07/29/2024   Compression fracture of T10 vertebra (HCC) 07/29/2024   T12 compression fracture (HCC) 07/29/2024   Chronic pancreatitis (HCC) 07/29/2024   Abdominal pain 07/29/2024   Fever 02/08/2024   Pancreatic cyst 01/17/2024   Lung nodules 01/17/2024   Sepsis (HCC) 01/16/2024   ESRD on dialysis (HCC) 01/16/2024   Personal history of prostate cancer 11/16/2023   Mucosal abnormality of stomach 10/15/2023   Gastric polyp 10/15/2023   Duodenitis 10/15/2023   Duodenal ulcer 10/15/2023   Acute blood loss anemia 10/13/2023   Melanotic stools 10/13/2023   SOB (shortness of breath) 10/11/2023   Pulmonary nodule 10/09/2023   Pre-syncope 10/09/2023   Dyspnea on exertion 10/09/2023   Anxiety 10/09/2023   Pancreatic cyst 10/09/2023   Gastroesophageal reflux disease 09/18/2023   Disorder of phosphorus  metabolism, unspecified 09/11/2023   Seasonal allergies 05/11/2023   Arthralgia of right knee 12/30/2022   Other disorders of phosphorus metabolism 07/14/2022   Diarrhea, unspecified 02/19/2022   Left knee pain 07/29/2021   Glaucoma 04/16/2021   Moderate recurrent major depression (HCC) 04/16/2021   Dependence on renal dialysis 03/30/2021   Pruritus, unspecified 11/29/2020   Prostate cancer (HCC) 11/23/2020   ESRD on dialysis (HCC) 10/31/2020   Bladder outlet obstruction 09/13/2020   Secondary hyperparathyroidism of renal origin 07/30/2020   COVID-19 07/16/2020   Iron  deficiency anemia, unspecified 06/26/2020   UTI (urinary tract infection) 06/12/2020   Essential hypertension 06/11/2020   Acute kidney injury (AKI) with acute tubular necrosis (ATN) 06/11/2020   Gross hematuria 06/11/2020   Depression 06/11/2020   D-dimer, elevated 06/11/2020   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   Prolonged QT interval 06/11/2020   Urinary tract infection associated with catheterization of urinary tract, initial encounter 06/10/2020   Moderate protein-calorie malnutrition 06/05/2020   Allergy, unspecified, sequela 06/02/2020   Anemia in chronic kidney disease 06/02/2020   Coagulation defect, unspecified 06/02/2020   Encounter for immunization 06/02/2020   Pain, unspecified 06/02/2020   Personal history of anaphylaxis 06/02/2020   Acute kidney injury 05/11/2020   Hypokalemia 05/11/2020   Hypocalcemia 05/11/2020   Hypomagnesemia 05/11/2020   Nausea and vomiting 05/11/2020   Primary open angle glaucoma (POAG) of both eyes, severe stage 08/13/2017     Current Outpatient Medications on File Prior to Visit  Medication Sig Dispense Refill   calcitRIOL  (ROCALTROL ) 0.25 MCG capsule Take 5 capsules (1.25 mcg total) by mouth Every Tuesday,Thursday,and Saturday with dialysis. 30 capsule 0   cinacalcet  (  SENSIPAR ) 30 MG tablet Take 1 tablet (30 mg total) by mouth Every  Tuesday,Thursday,and Saturday with dialysis. 60 tablet 0   CREON  36000-114000 units CPEP capsule Take 36,000-72,000 Units by mouth See admin instructions. Take 72,000 units by mouth three time a day with meals and 36,000 units three times a day with snacks     cyanocobalamin  1000 MCG tablet Take 1 tablet (1,000 mcg total) by mouth daily. 90 tablet 2   diphenhydrAMINE  (BENADRYL ) 25 mg capsule Take 25 mg by mouth every 4 (four) hours as needed for itching or allergies.     ferrous sulfate  325 (65 FE) MG EC tablet Take 325 mg by mouth daily with breakfast.     FLUoxetine  (PROZAC ) 10 MG capsule Take 1 capsule (10 mg total) by mouth daily. 90 capsule 0   LUMIGAN  0.01 % SOLN Place 1 drop into both eyes at bedtime. 7.5 mL 0   Methoxy PEG-Epoetin Beta (MIRCERA IJ) 30 mcg.     QUEtiapine  Fumarate (SEROQUEL  XR) 150 MG 24 hr tablet Take 300 mg by mouth at bedtime.     sevelamer  carbonate (RENVELA ) 800 MG tablet Take 3 tablets (2,400 mg total) by mouth 3 (three) times daily with meals. 270 tablet 3   sildenafil (VIAGRA) 100 MG tablet Take 100 mg by mouth as needed for erectile dysfunction.     timolol  (TIMOPTIC ) 0.5 % ophthalmic solution Place 1 drop into both eyes every morning. 15 mL 0   TYLENOL  500 MG tablet Take 500-1,000 mg by mouth every 6 (six) hours as needed for mild pain (pain score 1-3) or headache.     mirtazapine  (REMERON ) 15 MG tablet Take 15 mg by mouth daily. (Patient not taking: Reported on 07/28/2024)     montelukast  (SINGULAIR ) 10 MG tablet Take 10 mg by mouth daily. (Patient not taking: Reported on 07/28/2024)     sertraline  (ZOLOFT ) 100 MG tablet Take 1 tablet (100 mg total) by mouth in the morning and at bedtime. (Patient taking differently: Take 100 mg by mouth daily.) 180 tablet 0   Travoprost , BAK Free, (TRAVATAN ) 0.004 % SOLN ophthalmic solution Place 1 drop into both eyes at bedtime. (Patient not taking: Reported on 07/28/2024) 15 mL 0   No current facility-administered medications  on file prior to visit.    No Known Allergies  Social History   Socioeconomic History   Marital status: Divorced    Spouse name: Not on file   Number of children: Not on file   Years of education: Not on file   Highest education level: Not on file  Occupational History   Occupation: art gallery manager  Tobacco Use   Smoking status: Never    Passive exposure: Never   Smokeless tobacco: Never  Vaping Use   Vaping status: Never Used  Substance and Sexual Activity   Alcohol use: Not Currently    Alcohol/week: 1.0 standard drink of alcohol    Types: 1 Glasses of wine per week    Comment: rare   Drug use: Not Currently   Sexual activity: Not Currently    Birth control/protection: None  Other Topics Concern   Not on file  Social History Narrative   ** Merged History Encounter **       Right handed Drinks caffeine Lives here in [place] for 6 years, came from [place] Lives alone   Social Drivers of Health   Financial Resource Strain: Low Risk  (11/10/2023)   [facility]   Overall Financial Resource Strain (CARDIA)  Difficulty of Paying Living Expenses: Not hard at all  Food Insecurity: No Food Insecurity (07/29/2024)   Hunger Vital Sign    Worried About Running Out of Food in the Last Year: Never true    Ran Out of Food in the Last Year: Never true  Transportation Needs: No Transportation Needs (07/29/2024)   PRAPARE - Administrator, Civil Service (Medical): No    Lack of Transportation (Non-Medical): No  Physical Activity: Sufficiently Active (08/21/2022)   Received from [facility]   Exercise Vital Sign    On average, how many days per week do you engage in moderate to strenuous exercise (like a brisk walk)?: 4 days    On average, how many minutes do you engage in exercise at this level?: 50 min  Stress: No Stress Concern Present (02/02/2024)   Received from [facility] of Occupational Health - Occupational Stress  Questionnaire    Feeling of Stress : Only a little  Social Connections: Socially Isolated (07/29/2024)   Social Connection and Isolation Panel    Frequency of Communication with Friends and Family: Never    Frequency of Social Gatherings with Friends and Family: Never    Attends Religious Services: Never    Database Administrator or Organizations: No    Attends Banker Meetings: Never    Marital Status: Divorced  Catering Manager Violence: Not At Risk (07/29/2024)   Humiliation, Afraid, Rape, and Kick questionnaire    Fear of Current or Ex-Partner: No    Emotionally Abused: No    Physically Abused: No    Sexually Abused: No    Family History  Problem Relation Age of Onset   Stroke Mother    Breast cancer Neg Hx    Colon cancer Neg Hx    Prostate cancer Neg Hx    Pancreatic cancer Neg Hx     Past Surgical History:  Procedure Laterality Date   APPENDECTOMY     BIOPSY  10/15/2023   Procedure: BIOPSY;  Surgeon: Leigh Elspeth SQUIBB, MD;  Location: [facility] ENDOSCOPY;  Service: Gastroenterology;;   DIALYSIS/PERMA CATHETER INSERTION N/A 09/23/2023   Procedure: DIALYSIS/PERMA CATHETER INSERTION;  Surgeon: Melia Lynwood ORN, MD;  Location: [facility] INVASIVE CV LAB;  Service: Cardiovascular;  Laterality: N/A;   DIALYSIS/PERMA CATHETER REMOVAL N/A 09/23/2023   Procedure: DIALYSIS/PERMA CATHETER REMOVAL;  Surgeon: Melia Lynwood ORN, MD;  Location: [facility] INVASIVE CV LAB;  Service: Cardiovascular;  Laterality: N/A;   ESOPHAGOGASTRODUODENOSCOPY (EGD) WITH PROPOFOL  N/A 10/15/2023   Procedure: ESOPHAGOGASTRODUODENOSCOPY (EGD) WITH PROPOFOL ;  Surgeon: Leigh Elspeth SQUIBB, MD;  Location: [facility] ENDOSCOPY;  Service: Gastroenterology;  Laterality: N/A;   growth in nostril     biopsied negatiove   IR FLUORO GUIDE CV LINE RIGHT  05/12/2020   IR FLUORO GUIDE CV LINE RIGHT  05/28/2020   IR FLUORO GUIDE CV LINE RIGHT  02/09/2024   IR REMOVAL TUN CV CATH W/O FL  02/07/2024   IR US  GUIDE VASC ACCESS RIGHT   05/12/2020   IR US  GUIDE VASC ACCESS RIGHT  05/28/2020   IR US  GUIDE VASC ACCESS RIGHT  02/09/2024   TRANSURETHRAL RESECTION OF PROSTATE N/A 09/13/2020   Procedure: TRANSURETHRAL RESECTION OF THE PROSTATE (TURP) WITH CYSTOSCOPY;  Surgeon: Renda Glance, MD;  Location: [facility] ORS;  Service: Urology;  Laterality: N/A;   TUNNELLED CATHETER EXCHANGE N/A 04/04/2024   Procedure: TUNNELLED CATHETER EXCHANGE;  Surgeon: Magda Debby SAILOR, MD;  Location: [facility];  Service: Cardiovascular;  Laterality: N/A;    ROS: Review of Systems Negative except as stated above  PHYSICAL EXAM: BP 128/78   Pulse (!) 109   Temp 99 F (37.2 C) (Oral)   Resp 16   Ht 6' 1 (1.854 m)   Wt 206 lb 9.6 oz (93.7 kg)   SpO2 95%   BMI 27.26 kg/m   Physical Exam HENT:     Head: Normocephalic and atraumatic.     Nose: Nose normal.     Mouth/Throat:     Mouth: Mucous membranes are moist.     Pharynx: Oropharynx is clear.  Eyes:     Extraocular Movements: Extraocular movements intact.     Conjunctiva/sclera: Conjunctivae normal.     Pupils: Pupils are equal, round, and reactive to light.  Cardiovascular:     Rate and Rhythm: Tachycardia present.     Pulses: Normal pulses.     Heart sounds: Normal heart sounds.  Pulmonary:     Effort: Pulmonary effort is normal.     Breath sounds: Normal breath sounds.  Abdominal:     General: Bowel sounds are normal.     Palpations: Abdomen is soft.  Musculoskeletal:        General: Normal range of motion.     Cervical back: Normal range of motion and neck supple.  Neurological:     General: No focal deficit present.     Mental Status: He is alert and oriented to person, place, and time.  Psychiatric:        Mood and Affect: Mood normal.        Behavior: Behavior normal.     ASSESSMENT AND PLAN: 1. Hospital discharge follow-up (Primary) - Reviewed hospital course, current medications, ensured proper follow-up in place, and addressed concerns.   2. Epigastric  pain 3. Urinary tract infection without hematuria, site unspecified 4. Fever, unspecified fever cause 5. Abdominal pain, unspecified abdominal location - Resolved.  6. Dyspnea, unspecified type - Resolved.  - Message sent to RN case manager for update on [facility].  7. Lightheadedness 8. Near syncope - Resolved.  9. ESRD on dialysis [facility]) - Keep all scheduled appointments with established Nephrology.  10. Chronic pancreatitis, unspecified pancreatitis type [facility]) - Referral to Gastroenterology for evaluation/management. - Ambulatory referral to Gastroenterology  11. Hypercalcemia - Resolved prior to hospital discharge.  12. Compression fracture of T10 vertebra, sequela 13. Compression fracture of T12 vertebra, sequela - Referral to Orthopedic Surgery for evaluation/management. - Ambulatory referral to Orthopedic Surgery  14. Normocytic anemia - Continue present management.  - Follow-up with primary provider as scheduled.   15. Anxiety and depression - Patient denies thoughts of self-harm, suicidal ideations, homicidal ideations. - Continue present management.  - Referral to Psychiatry for evaluation/management.  - Follow-up with primary provider as scheduled.  - Ambulatory referral to Psychiatry  16. Glaucoma, unspecified glaucoma type, unspecified laterality - Referral to Ophthalmology for evaluation/management. - Ambulatory referral to Ophthalmology  17. Colon cancer screening - Referral to Gastroenterology for colon cancer screening by colonoscopy. - Ambulatory referral to Gastroenterology  Patient was given the opportunity to ask questions.  Patient verbalized understanding of the plan and was able to repeat key elements of the plan. Patient was given clear instructions to go to Emergency Department or return to medical center if symptoms don't improve, worsen, or new problems develop.The patient verbalized understanding.   Orders Placed This Encounter   Procedures   Ambulatory referral to Psychiatry   Ambulatory referral to Orthopedic Surgery  Ambulatory referral to Gastroenterology   Ambulatory referral to Ophthalmology    Return for Follow-up as needed.  Greig JINNY Chute, NP

## 2024-09-02 NOTE — Progress Notes (Signed)
 Pain around right eye socket,  referral to GI with , needs a referral for mental therapy, referral to ortho had a scan and he had 2 fractures so he wants to know what's going on

## 2024-09-03 DIAGNOSIS — N186 End stage renal disease: Secondary | ICD-10-CM | POA: Diagnosis not present

## 2024-09-05 ENCOUNTER — Ambulatory Visit: Payer: Self-pay

## 2024-09-05 ENCOUNTER — Telehealth: Payer: Self-pay | Admitting: Emergency Medicine

## 2024-09-05 DIAGNOSIS — N186 End stage renal disease: Secondary | ICD-10-CM | POA: Diagnosis not present

## 2024-09-05 NOTE — Telephone Encounter (Signed)
 FYI Only or Action Required?: FYI only for provider: ED advised and refused and call back.  Patient was last seen in primary care on 09/02/2024 by Jaycee Greig PARAS, NP.  Called Nurse Triage reporting Shortness of Breath.  Symptoms began today.  Interventions attempted: Nothing.  Symptoms are: unchanged.  Triage Disposition: Go to ED Now (Notify PCP)  Patient/caregiver understands and will follow disposition?: No, wishes to speak with PCP   Copied from CRM #[id number]. Topic: Clinical - Red Word Triage >> Sep 05, 2024  4:07 PM Myrick T wrote: Red Word that prompted transfer to Nurse Triage: patient called stated he is sob. Reason for Disposition  [1] MODERATE difficulty breathing (e.g., speaks in phrases, SOB even at rest, pulse 100-120) AND [2] NEW-onset or WORSE than normal  Answer Assessment - Initial Assessment Questions Advised ED now, patient declines.  Patient request oxygen at home and call back.  Advised 911 if symptoms worsens.  1. RESPIRATORY STATUS: Describe your breathing? (e.g., wheezing, shortness of breath, unable to speak, severe coughing)      Sob, hard time breathing,  Just came from dialysis and it happens after dialysis 2. ONSET: When did this breathing problem begin?      today 3. PATTERN Does the difficult breathing come and go, or has it been constant since it started?      constant 4. SEVERITY: How bad is your breathing? (e.g., mild, moderate, severe)      severe 5. RECURRENT SYMPTOM: Have you had difficulty breathing before? If Yes, ask: When was the last time? and What happened that time?      yes 6. CARDIAC HISTORY: Do you have any history of heart disease? (e.g., heart attack, angina, bypass surgery, angioplasty)      no 7. LUNG HISTORY: Do you have any history of lung disease?  (e.g., pulmonary embolus, asthma, emphysema)     no 8. CAUSE: What do you think is causing the breathing problem?      Only happens after dialysis 9.  OTHER SYMPTOMS: Do you have any other symptoms? (e.g., chest pain, cough, dizziness, fever, runny nose)     Denies chest pain,  dizziness 10. O2 SATURATION MONITOR:  Do you use an oxygen saturation monitor (pulse oximeter) at home? If Yes, ask: What is your reading (oxygen level) today? What is your usual oxygen saturation reading? (e.g., 95%)       No; has no inhalers  Protocols used: Breathing Difficulty-A-AH

## 2024-09-05 NOTE — Telephone Encounter (Signed)
 I called patient to see what he needed from [facility] and he stated he needs oxygen.  I teams Slater and made her aware

## 2024-09-05 NOTE — Telephone Encounter (Signed)
 Patient spoke to NT earlier today and was advised ED and refused. Patient is calling back now and stated his symptoms are resolving. Patient stated Mylene has now approved benefits for home oxygen. Patient would like to make provider aware that his insurance seeking provider to expedite this request. Please advise. Patient has been advised to go to ED for return/worsening symptoms.

## 2024-09-05 NOTE — Telephone Encounter (Signed)
 I called patient to see if he has heard anything with [facility] and he stated no,  I made patient aware that I would check on it and call him back to update him when I found out any information.

## 2024-09-05 NOTE — Telephone Encounter (Signed)
 I called patient and had to leave a message requesting a call back. I need to clarify what he is expecting from [facility].  Per Rayfield Gobble, RN CM 08/03/2024: Spoke with pt this am- pt voiced he does not need BSC as he is able to walk to bathroom - [facility]SC declined by pt on delivery. No further DME needs noted.

## 2024-09-05 NOTE — Telephone Encounter (Signed)
 noted

## 2024-09-05 NOTE — Telephone Encounter (Signed)
 Report to Emergency Department/Urgent Care/call 911 for immediate medical evaluation. Follow-up with Primary Care.

## 2024-09-05 NOTE — Telephone Encounter (Signed)
 I called and spoke with patient and made him aware that he would need to qualify per Slater. He would need a visit with Amy to address this need; because the last note said he doesn't need it.  During that visit you can do a walk test to determine if he qualifies.  I transferred his call to the front desk to schedule an appointment

## 2024-09-06 ENCOUNTER — Other Ambulatory Visit: Payer: Self-pay | Admitting: Family

## 2024-09-06 DIAGNOSIS — R0600 Dyspnea, unspecified: Secondary | ICD-10-CM

## 2024-09-06 NOTE — Telephone Encounter (Signed)
 Referral to Pulmonology for evaluation/management. Expect call soon with appointment details. Also, please note message sent to RN case manager on 09/02/24  2:41 PM for patient assistance. During the interim report to the Emergency Department/Urgent Care/call 911 for immediate medical evaluation.

## 2024-09-07 ENCOUNTER — Telehealth: Payer: Self-pay

## 2024-09-07 DIAGNOSIS — N2581 Secondary hyperparathyroidism of renal origin: Secondary | ICD-10-CM | POA: Diagnosis not present

## 2024-09-07 DIAGNOSIS — N186 End stage renal disease: Secondary | ICD-10-CM | POA: Diagnosis not present

## 2024-09-07 NOTE — Telephone Encounter (Signed)
 Copied from CRM #[id number]. Topic: Clinical - Order For Equipment >> Sep 06, 2024  4:50 PM Hadassah PARAS wrote: Reason for CRM: Pt is calling to check is PCP has contacted [facility] regarding expediting oxygen equipment. Pt was advised by [facility] that this will need to be sent a expedited/urgent if not it could take an addtl 14 days. Please advise pt #[phone]

## 2024-09-10 DIAGNOSIS — Z992 Dependence on renal dialysis: Secondary | ICD-10-CM | POA: Diagnosis not present

## 2024-09-10 DIAGNOSIS — N2581 Secondary hyperparathyroidism of renal origin: Secondary | ICD-10-CM | POA: Diagnosis not present

## 2024-09-10 DIAGNOSIS — N186 End stage renal disease: Secondary | ICD-10-CM | POA: Diagnosis not present

## 2024-09-11 ENCOUNTER — Other Ambulatory Visit: Payer: Self-pay | Admitting: Family

## 2024-09-11 DIAGNOSIS — F32A Depression, unspecified: Secondary | ICD-10-CM

## 2024-09-11 DIAGNOSIS — I120 Hypertensive chronic kidney disease with stage 5 chronic kidney disease or end stage renal disease: Secondary | ICD-10-CM | POA: Diagnosis not present

## 2024-09-11 DIAGNOSIS — Z992 Dependence on renal dialysis: Secondary | ICD-10-CM | POA: Diagnosis not present

## 2024-09-11 DIAGNOSIS — N186 End stage renal disease: Secondary | ICD-10-CM | POA: Diagnosis not present

## 2024-09-12 ENCOUNTER — Telehealth: Payer: Self-pay

## 2024-09-12 ENCOUNTER — Telehealth: Payer: Self-pay | Admitting: Family

## 2024-09-12 ENCOUNTER — Other Ambulatory Visit: Payer: Self-pay | Admitting: Family

## 2024-09-12 DIAGNOSIS — Z1211 Encounter for screening for malignant neoplasm of colon: Secondary | ICD-10-CM

## 2024-09-12 NOTE — Telephone Encounter (Signed)
 Complete

## 2024-09-12 NOTE — Telephone Encounter (Signed)
 Copied from CRM #[id number]. Topic: Referral - Request for Referral >> Sep 12, 2024 12:42 PM Nathanel BROCKS wrote: Did the patient discuss referral with their provider in the last year? Yes (If No - schedule appointment) (If Yes - send message)  Appointment offered? No  Type of order/referral and detailed reason for visit Gastrointestinal, [place] ([facility])  Preference of office, provider, location: [facility]  If referral order, have you been seen by this specialty before? No (If Yes, this issue or another issue? When? Where?  Can we respond through MyChart? No

## 2024-09-12 NOTE — Telephone Encounter (Signed)
 Slater,   If you will please assist patient with assistance from [employer] in regards to home oxygen equipment. Also, patient referred to Pulmonology on 09/06/24 1059. Please let me know if I can further assist.   Thank you

## 2024-09-12 NOTE — Telephone Encounter (Unsigned)
 Copied from CRM #[id number]. Topic: Clinical - Medication Refill >> Sep 12, 2024 12:55 PM Nathanel C wrote: Medication: QUEtiapine  Fumarate (SEROQUEL  XR) 150 MG 24 hr tablet  Has the patient contacted their pharmacy? No  This is the patient's preferred pharmacy:  [facility] [address], [place] - [address] [place] [address] Phone: [phone] Fax: [phone]  Is this the correct pharmacy for this prescription? Yes If no, delete pharmacy and type the correct one.   Has the prescription been filled recently? Yes  Is the patient out of the medication? Yes  Has the patient been seen for an appointment in the last year OR does the patient have an upcoming appointment? Yes  Can we respond through MyChart? Yes  Agent: Please be advised that Rx refills may take up to 3 business days. We ask that you follow-up with your pharmacy.

## 2024-09-12 NOTE — Telephone Encounter (Unsigned)
 Copied from CRM [id number]. Topic: Clinical - Medical Advice >> Sep 12, 2024 11:13 AM Leonette SQUIBB wrote: Reason for CRM: Patient called for a FU on the question of a referral to mental health, and on incontinence supplies.  He also asked a bout a GI referral.  I told him the GI referral was done.  Please advise. [phone]

## 2024-09-12 NOTE — Telephone Encounter (Unsigned)
 Copied from CRM #[id number]. Topic: Clinical - Medical Advice >> Sep 12, 2024 12:45 PM Nathanel BROCKS wrote: Reason for CRM: PT has called several time in regards to getting his oxygen supplies. Pt stated that his insurance, [employer] has been approved. He is saying that it needs to be expedited or its going to be over  2 weeks to get it. He has told [employer] that he is short of oxygen.

## 2024-09-12 NOTE — Telephone Encounter (Signed)
 I made patient aware of pcp recommendations

## 2024-09-12 NOTE — Telephone Encounter (Signed)
 I first spoke to Dean/  [facility] and he stated that they do not have any orders for O2.  The last order they had for the patient was a rollator in 2021.  I called the patient and he explained that 2-3x/month he feels short of breath, like he will pass out. He noted that this usually occurs when he is returning home from dialysis, sometimes a few hours after dialysis.  He said when he has the O2 he feels better and he said he called [employer] and was told that his O2 would be approved if the provider sends in an order.  I explained to him that receiving O2 is a covered benefit but he would still need to  qualify for that benefit. I also explained to him that O2 was not ordered when he left the hospital and I called [facility] to see if they had an order for O2 and was told no, they do not have an order.   Because his symptoms tend to be related to dialysis, I instructed him to discuss those concerns with the nephrologist and he said he would . I also told him that Amy placed a referral to pulmonary and the pulmonologist would be able to provide more insight into any specific pulmonary issues.  I also told him that the pulmonologist can order O2 if he qualifies while at the pulmonary appointment.    Altames[employer]- can you please try to expedite the pulmonary referral?  In the meantime, I told him that Amy is not able to order the O2 unless he  qualifies for the insurance company.  I explained to him how the walk test is done and I also explained that Amy would need documentation of this need for O2.   Because he has these episodes of shortness of breath after dialysis, I told him that we can schedule the walk test and appointment with Amy after his dialysis session.  He has dialysis TTS so he would like an appointment between 11-12 on a Tues or Thurs or early afternoon on Tues or Thurs.   He was very appreciative of the explanation.    I told him that a representative from [facility] would call him and schedule  that appointment with Amy and a walk test as soon as possible.    Purvis- are you able to call him or have someone else from the office call him with that appointment?

## 2024-09-12 NOTE — Telephone Encounter (Signed)
 I sent Manuel Ortega a message to call patient because he is very upset about oxygen order,.

## 2024-09-13 ENCOUNTER — Telehealth: Payer: Self-pay | Admitting: Acute Care

## 2024-09-13 NOTE — Telephone Encounter (Signed)
 Subject: Follow-up: CT Test Results Request   The patient is asking for a call back to discuss results.

## 2024-09-13 NOTE — Telephone Encounter (Signed)
 Quetiapine  appears as historical medication.

## 2024-09-13 NOTE — Telephone Encounter (Signed)
-   Patient referred to Psychiatry during office visit on 09/02/2024. Please provide patient with referral contact information.  - During office visit on 09/02/2024 incontinence supplies was not discussed. Schedule appointment.

## 2024-09-13 NOTE — Telephone Encounter (Signed)
 Noted

## 2024-09-13 NOTE — Telephone Encounter (Signed)
 Spoke with the pt  He is asking about CT chest results He says he [facility] days [facility]  I can not locate the results  He states he will find out exactly where he went and call back tomorrow  Will hold until he calls back

## 2024-09-13 NOTE — Telephone Encounter (Signed)
 Thank you Erskine Squibb!

## 2024-09-14 ENCOUNTER — Other Ambulatory Visit: Payer: Self-pay | Admitting: Family

## 2024-09-14 ENCOUNTER — Telehealth: Payer: Self-pay | Admitting: Emergency Medicine

## 2024-09-14 DIAGNOSIS — F419 Anxiety disorder, unspecified: Secondary | ICD-10-CM

## 2024-09-14 NOTE — Telephone Encounter (Signed)
 I called patient to make him aware that pcp did not prescribe medication requested and he needs to contact that prescriber for medication refill

## 2024-09-14 NOTE — Telephone Encounter (Signed)
 Quetiapine  appears as historical medication.

## 2024-09-14 NOTE — Telephone Encounter (Signed)
 Manuel Ortega reviewed CT scan nodules are stable will need one more scan,LSF can go in more detail in 12/8 informed of appointment.NFN as of now

## 2024-09-15 ENCOUNTER — Telehealth: Payer: Self-pay

## 2024-09-15 NOTE — Telephone Encounter (Signed)
 I called the patient to discuss scheduling the appointment with Greig Chute, NP and I had to leave a message requesting a call back.   He has an appointment with pulmonary 09/19/2024 and will be able to address his need for O2 with the pulmonologist

## 2024-09-15 NOTE — Telephone Encounter (Signed)
 Copied from CRM #[id number]. Topic: General - Other >> Sep 15, 2024  4:11 PM Winona R wrote: Pt returned Franchot Atlas L, CM I passed along the message from yesterday, please contact the pt if there is anything else needed

## 2024-09-15 NOTE — Telephone Encounter (Signed)
 I called patient no one answered so I left a voicemail to return my call

## 2024-09-16 ENCOUNTER — Encounter

## 2024-09-16 ENCOUNTER — Ambulatory Visit: Payer: Self-pay

## 2024-09-16 ENCOUNTER — Telehealth: Payer: Self-pay

## 2024-09-16 ENCOUNTER — Ambulatory Visit (HOSPITAL_COMMUNITY)
Admission: EM | Admit: 2024-09-16 | Discharge: 2024-09-16 | Disposition: A | Discharge status: Home / Self Care | Attending: Psychiatry | Admitting: Psychiatry

## 2024-09-16 DIAGNOSIS — F329 Major depressive disorder, single episode, unspecified: Secondary | ICD-10-CM

## 2024-09-16 NOTE — Telephone Encounter (Signed)
 FYI Only or Action Required?: Action required by provider: clinical question for provider and update on patient condition.  Patient was last seen in primary care on 09/02/2024 by Jaycee Greig PARAS, NP.  Called Nurse Triage reporting Advice Only.  Symptoms began several months ago.  Interventions attempted: Rest, hydration, or home remedies.  Symptoms are: unchanged.  Triage Disposition: Information or Advice Only Call  Patient/caregiver understands and will follow disposition?: Yes  Reason for Disposition  Health information question, no triage required and triager able to answer question  Answer Assessment - Initial Assessment Questions 1. REASON FOR CALL: What is the main reason for your call? or How can I best help you?     Patient called to check on status of psyh referral and the possibility of getting oxygen at home for use after dialysis.   2. SYMPTOMS : Do you have any symptoms?      Reports occasional shallow breathing after dialysis.  Reports feeling stressed over current health concerns 3. OTHER QUESTIONS: Do you have any other questions?     None.    Explained that there is a psych referral in his chart.  Provided phone number for [facility].  Declined contact information for any mental health help lines.    Will continue to rest when he feels like he has shallow breathing after dialysis.   Will call EMS if symptoms worsen  Will follow up with [facility] on Monday when the office is open.    Patient thanked me for the information and had no other concerns.  Protocols used: Information Only Call - No Triage-A-AH

## 2024-09-16 NOTE — Telephone Encounter (Signed)
 Please refer to my note from 09/16/24  3:27 PM.

## 2024-09-16 NOTE — Telephone Encounter (Signed)
 1st attempt to reach patient, left message with office call back number.   Copied from CRM #[id number]. Topic: Clinical - Red Word Triage >> Sep 16, 2024  9:09 AM Treva T wrote: [facility] that prompted transfer to Nurse Triage: Pt calling states he occasionally after received dialysis treatment he is having episodes where he cannot breathe. He has been calling the ambulance when this happens, but he states he doesn't feel it's necessary for him to call EMS every time as it is an ongoing issue.  Pt reports he has been denied for oxygen equipment via his [employer].  Reports the next time it happens he will just allow himself to faint and not call EMS.  Pt is upset, and wants to speak to nurse. >> Sep 16, 2024  9:21 AM Treva T wrote: Pt hung up while holding for NT, for red word CRM for difficulty breathing, attempted to call pt back, no response.

## 2024-09-16 NOTE — ED Notes (Signed)
 Patient discharged by provider.

## 2024-09-16 NOTE — Telephone Encounter (Addendum)
 Attempted to contact patient x 2 to discuss symptoms; LVM to return call, Will attempt to contact patient at a later time to further discuss concerns.          Copied from CRM #[id number]. Topic: Clinical - Red Word Triage >> Sep 16, 2024  9:09 AM Treva T wrote: [facility] that prompted transfer to Nurse Triage: Pt calling states he occasionally after received dialysis treatment he is having episodes where he cannot breathe. He has been calling the ambulance when this happens, but he states he doesn't feel it's necessary for him to call EMS every time as it is an ongoing issue.   Pt reports he has been denied for oxygen equipment via his [employer].   Reports the next time it happens he will just allow himself to faint and not call EMS.   Pt is upset, and wants to speak to nurse.

## 2024-09-16 NOTE — Telephone Encounter (Signed)
 Attempted to contact patient x 3 to discuss symptoms; will route to clinic for follow-up.           Copied from CRM #[id number]. Topic: Clinical - Red Word Triage >> Sep 16, 2024  9:09 AM Treva T wrote: [facility] that prompted transfer to Nurse Triage: Pt calling states he occasionally after received dialysis treatment he is having episodes where he cannot breathe. He has been calling the ambulance when this happens, but he states he doesn't feel it's necessary for him to call EMS every time as it is an ongoing issue.   Pt reports he has been denied for oxygen equipment via his [employer].   Reports the next time it happens he will just allow himself to faint and not call EMS.   Pt is upset, and wants to speak to nurse. >> Sep 16, 2024  9:21 AM Treva T wrote: Pt hung up while holding for NT, for red word CRM for difficulty breathing, attempted to call pt back, no response.

## 2024-09-16 NOTE — ED Provider Notes (Signed)
 Behavioral Health Urgent Care Medical Screening Exam  Patient Name: Manuel Ortega MRN: [id number] Date of Evaluation: 09/16/24 Chief Complaint:  Depression and stress Diagnosis:  Final diagnoses:  Reactive depression    History of Present illness: Manuel Ortega 73 y.o., male patient presented to [facility] as a voluntary walk in unaccompanied with complaints of worsening depression and stress related to current health concerns and family issues that Manuel Ortega is dealing with. Manuel Ortega, is seen face to face by this provider and chart reviewed on 09/16/24.  Per chart review patient is currently diagnosed with depression and anxiety.  Manuel Ortega is being prescribed fluoxetine  10 mg daily, mirtazapine  15 mg nightly, Seroquel  XR 300 mg nightly and recently discontinued his sertraline  100 mg daily.  Patient has a medical history significant for end-stage renal disease, currently on dialysis 3 days a week, chronic back pain, chronic pancreatitis and has recently began to develop SOB after dialysis treatments.  Patient does see his PCP and specialists regularly.    On evaluation Manuel Ortega reports that Manuel Ortega has been feeling more depressed and stressed out than Manuel Ortega has before.  Patient reports that Manuel Ortega is having some health problems and does not feel that his provider or insurance is truly listening to him.  Manuel Ortega reports that Manuel Ortega currently is on dialysis and has been having shortness of breath after treatments requiring him to call EMS and transferred to hospital.  Patient states that Manuel Ortega requested at home oxygen so that Manuel Ortega can use it when feeling short of breath.  Patient states that initially insurance denied it and then approved it but is making him obtain a lot more testing to determine if it is truly related to pulmonary issue.  Patient has an appointment with pulmonologist on Monday.  Patient states that Manuel Ortega has been on dialysis for about 4 years and that the shortness of breath started at the beginning of this year.   Patient states that Manuel Ortega feels like nobody cares or is listening to his concerns and states I feel like since nobody cares, I need to do something that will force the insurance company and providers to listen to me about my body.  So the next time that I feel like I am short of breath and about to pass out I am just going to let it happen instead of trying to catch myself.  Patient states that Manuel Ortega has been open to the idea of allowing himself to get hurt if that means Manuel Ortega will be listened to by his medical team and provided with the oxygen Manuel Ortega has been requesting.  Patient also reports wanting to find a job, having ongoing issues with his daughter and ex-wife as well as some newfound sexuality interest.  Patient states that Manuel Ortega has recently started dating a man and that the relationship is very supportive and has been extremely helpful for him.  Patient reports that Manuel Ortega sleeps well and has a fluctuating appetite on his dialysis treatment.  Manuel Ortega denies active suicidal ideations but does report feeling like Manuel Ortega could see himself getting to a point of wanting to take his life.  At this time patient describes it more as Manuel Ortega not caring whether Manuel Ortega lives or dies.  Manuel Ortega denies history of suicide attempts and self-harm.  Manuel Ortega denies homicidal ideations and psychotic symptoms.  Patient is able to contract for safety and does not believe that Manuel Ortega wants to harm himself after being able to process through things with provider.  Patient feels that Manuel Ortega does  need a psychotherapist but feels his current medication regimen is effective.  We discussed the option of intensive outpatient program to engage in a more frequent and intensive form of therapy.  Patient encouraged to return to the nearest ED or [facility] if symptoms continue to worsen or suicidal ideations become active.  During evaluation Manuel Ortega is sitting up in lobby, in no acute distress.  Manuel Ortega is alert & oriented x 4, calm, cooperative and attentive for this  assessment.  His mood is dysphoric with congruent affect.  Manuel Ortega has normal speech, and behavior.  Objectively there is no evidence of psychosis/mania or delusional thinking. Pt does not appear to be responding to internal or external stimuli.  Patient is able to converse coherently, goal directed thoughts, no distractibility, or pre-occupation.  Manuel Ortega denies current suicidal ideations, homicidal ideation, psychosis, and paranoia.  Patient answered question appropriately.      Flowsheet Row ED from 09/16/2024 in [facility] ED to Hosp-Admission (Discharged) from 07/28/2024 in [facility] 4E CV SURGICAL PROGRESSIVE CARE ED to Hosp-Admission (Discharged) from 02/04/2024 in [facility] 5W Medical Specialty PCU  C-SSRS RISK CATEGORY No Risk No Risk No Risk    Psychiatric Specialty Exam  Presentation  General Appearance:Casual  Eye Contact:Fair  Speech:Clear and Coherent  Speech Volume:Normal  Handedness:Right   Mood and Affect  Mood: Depressed; Dysphoric  Affect: Congruent; Depressed   Thought Process  Thought Processes: Coherent  Descriptions of Associations:Intact  Orientation:Full (Time, Place and Person)  Thought Content:WDL    Hallucinations:None  Ideas of Reference:None  Suicidal Thoughts:No  Homicidal Thoughts:No   Sensorium  Memory: Recent Fair; Immediate Good  Judgment: Fair  Insight: Fair; Good   Executive Functions  Concentration: Good  Attention Span: Fair; Good  Recall: FairMERL Metta Ortega of Knowledge: Fair; Good  Language: Fair; Good   Psychomotor Activity  Psychomotor Activity: Normal   Assets  Assets: Communication Skills; Desire for Improvement; Financial Resources/Insurance; Housing; Resilience; Social Support; Intimacy; Leisure Time; Talents/Skills; Transportation   Sleep  Sleep: Good  Number of hours:  8   Physical Exam: Physical Exam Vitals and nursing note reviewed.  Constitutional:       Appearance: Normal appearance.  HENT:     Head: Normocephalic.     Nose: Nose normal.  Eyes:     Extraocular Movements: Extraocular movements intact.  Cardiovascular:     Rate and Rhythm: Normal rate.  Pulmonary:     Effort: Pulmonary effort is normal.  Musculoskeletal:        General: Normal range of motion.     Cervical back: Normal range of motion.  Neurological:     General: No focal deficit present.     Mental Status: Manuel Ortega is alert and oriented to person, place, and time.  Psychiatric:        Behavior: Behavior normal.        Thought Content: Thought content normal.    Review of Systems  Constitutional: Negative.   HENT: Negative.    Eyes: Negative.   Respiratory: Negative.    Cardiovascular: Negative.   Gastrointestinal: Negative.   Genitourinary: Negative.   Musculoskeletal: Negative.   Neurological: Negative.   Endo/Heme/Allergies: Negative.   Psychiatric/Behavioral:  Positive for depression. The patient is nervous/anxious.    Blood pressure (!) 144/94, pulse (!) 118, temperature 98 F (36.7 C), temperature source Oral, resp. rate 17, SpO2 99%. There is no height or weight on file to calculate BMI.  Musculoskeletal: Strength & Muscle  Tone: within normal limits Gait & Station: normal Patient leans: N/A   [facility] MSE Discharge Disposition for Follow up and Recommendations: Based on my evaluation the patient does not appear to have an emergency medical condition and can be discharged with resources and follow up care in outpatient services for Partial Hospitalization Program Patient is discharged from [facility] with referral sent to behavioral health intensive outpatient program.  Patient also provided with multiple outpatient therapy resources.  Patient denies any recent attempts to self-harm.  Manuel Ortega denies current suicidal ideations and is able to contract for safety.  Patient displays goal oriented thinking such as wanting to get a job, plans to meet his boyfriend in [place]  soon and wanting to teach basic engineering soon.  Patient to continue with current medication regimen and IOP referral has been completed. Safety Plan Manuel Ortega will reach out to his boyfriend, call 911 or call mobile crisis, or go to nearest emergency room if condition worsens or if suicidal thoughts become active Patients' will follow up with [facility] for outpatient psychiatric services (therapy/medication management).  The suicide prevention education provided includes the following: Suicide risk factors Suicide prevention and interventions National Suicide Hotline telephone number [facility] assessment telephone number [facility] Emergency Assistance 911 [facility] and/or Residential Mobile Crisis Unit telephone number Request made of family/significant other to:   Remove weapons (e.g., guns, rifles, knives), all items previously/currently identified as safety concern.   Remove drugs/medications (over the counter, prescriptions, illicit drugs), all items previously/currently identified as a safety concern.   Manuel JAYSON Mcardle, NP 09/16/2024, 6:18 PM

## 2024-09-16 NOTE — Discharge Instructions (Addendum)
 Discharge recommendations:   Medications: Patient is to take medications as prescribed. The patient or patient's guardian is to contact a medical professional and/or outpatient provider to address any new side effects that develop. The patient or the patient's guardian should update outpatient providers of any new medications and/or medication changes.    Outpatient Follow up: Please review list of outpatient resources for psychiatry and counseling. Please follow up with your primary care provider for all medical related needs.    Therapy: Referral sent for Intensive Outpatient Program. We recommend that patient participate in individual therapy to address mental health concerns.   Atypical antipsychotics: If you are prescribed an atypical antipsychotic, it is recommended that your height, weight, BMI, blood pressure, fasting lipid panel, and fasting blood sugar be monitored by your outpatient providers.  Safety:   The following safety precautions should be taken:   No sharp objects. This includes scissors, razors, scrapers, and putty knives.   Chemicals should be removed and locked up.   Medications should be removed and locked up.   Weapons should be removed and locked up. This includes firearms, knives and instruments that can be used to cause injury.   The patient should abstain from use of illicit substances/drugs and abuse of any medications.  If symptoms worsen or do not continue to improve or if the patient becomes actively suicidal or homicidal then it is recommended that the patient return to the closest hospital emergency department, the [facility], or call 911 for further evaluation and treatment. National Suicide Prevention Lifeline 1-800-SUICIDE or 939-461-3542.  About 988 988 offers 24/7 access to trained crisis counselors who can help people experiencing mental health-related distress. People can call or text 988 or chat 988lifeline.org  for themselves or if they are worried about a loved one who may need crisis support.

## 2024-09-16 NOTE — Progress Notes (Signed)
   09/16/24 1518  [facility] Triage Screening (Walk-ins at [facility] only)  How Did You Hear About Us ? Self  What Is the Reason for Your Visit/Call Today? Manuel Ortega is a 73 year old male presenting to [name] unaccompanied. Pt states he is having ongoing mental stress. Pt reports that he is having thoughts of harming himself. Pt states he has no plan to end his life, but just suicidal thoughts. Pt has no past suicide attempts. Pt states he is taking Prozac  and saraquil at this time. Pt reports he is looking to establish therapy services at this time. Pt denies substance use, Hi and AVH.  How Long Has This Been Causing You Problems? <Week  Have You Recently Had Any Thoughts About Hurting Yourself? Yes  How long ago did you have thoughts about hurting yourself? today  Are You Planning to Commit Suicide/Harm Yourself At This time? No  Have you Recently Had Thoughts About Hurting Someone Sherral? No  Are You Planning To Harm Someone At This Time? No  Physical Abuse Denies  Verbal Abuse Denies  Sexual Abuse Denies  Exploitation of patient/patient's resources Denies  Self-Neglect Denies  Possible abuse reported to: Other (Comment)  Are you currently experiencing any auditory, visual or other hallucinations? No  Have You Used Any Alcohol or Drugs in the Past 24 Hours? No  Do you have any current medical co-morbidities that require immediate attention? No  What Do You Feel Would Help You the Most Today? Medication(s)  If access to [facility] was not available, would you have sought care in the Emergency Department? No  Determination of Need Routine (7 days)  Options For Referral Intensive Outpatient Therapy;Medication Management

## 2024-09-16 NOTE — Telephone Encounter (Signed)
 Copied from CRM #[id number]. Topic: Appointments - Scheduling Inquiry for Clinic >> Sep 16, 2024 11:11 AM Manuel Ortega wrote: Reason for CRM: Pt would like to know what his December 8th appt will entail in regards to treatment, pt would like to know if he will have a breathing treatment done and would like a f/u call back regarding this.

## 2024-09-16 NOTE — Telephone Encounter (Signed)
-   Patient referred to Psychiatry during office visit on 09/02/2024. Please provide patient with referral contact information.  - Please refer to note from Slater Diesel, RN from 09/12/24  5:01 PM. - Please refer to note from Altamese Adler from 09/13/24 11:28 AM. - During the interim report to the Emergency Department/Urgent Care/call 911 for immediate medical evaluation.

## 2024-09-18 NOTE — Progress Notes (Incomplete)
 New Patient Pulmonology Office Visit   Subjective:  Patient ID: Manuel Ortega, male    DOB: [birth year]  MRN: [id number]  Referred by: Jaycee Greig PARAS, NP  CC: No chief complaint on file.   HPI Manuel Ortega is a 73 y.o. 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 He was followed by pulmonary due to pulmonary nodule  A CT scan from [facility] in April 2025 showed a stable 6 mm noncalcified pulmonary nodule in the left lower lobe and a stable 6 mm subpleural nodule in the right lower lobe.  Patient is an acupuncturist.  Patient has never smoked and denies significant exposure to secondhand smoke, chemicals, toxins or textiles. There is a family history of rheumatoid arthritis, however.   He is currently not experiencing any respiratory symptoms such as shortness of breath.  DYSPNBE  {PULM QUESTIONNAIRES (Optional):33196}  ROS  Allergies: Patient has no known allergies.  Current Outpatient Medications:    calcitRIOL  (ROCALTROL ) 0.25 MCG capsule, Take 5 capsules (1.25 mcg total) by mouth Every Tuesday,Thursday,and Saturday with dialysis., Disp: 30 capsule, Rfl: 0   cinacalcet  (SENSIPAR ) 30 MG tablet, Take 1 tablet (30 mg total) by mouth Every Tuesday,Thursday,and Saturday with dialysis., Disp: 60 tablet, Rfl: 0   CREON  36000-114000 units CPEP capsule, Take 36,000-72,000 Units by mouth See admin instructions. Take 72,000 units by mouth three time a day with meals and 36,000 units three times a day with snacks, Disp: , Rfl:    cyanocobalamin  1000 MCG tablet, Take 1 tablet (1,000 mcg total) by mouth daily., Disp: 90 tablet, Rfl: 2   diphenhydrAMINE  (BENADRYL ) 25 mg capsule, Take 25 mg by mouth every 4 (four) hours as needed for itching or allergies., Disp: , Rfl:    ferrous sulfate  325 (65 FE) MG EC tablet, Take 325 mg by mouth daily with breakfast., Disp: , Rfl:    FLUoxetine  (PROZAC ) 10 MG capsule, Take 1 capsule (10 mg total) by  mouth daily., Disp: 90 capsule, Rfl: 0   LUMIGAN  0.01 % SOLN, Place 1 drop into both eyes at bedtime., Disp: 7.5 mL, Rfl: 0   Methoxy PEG-Epoetin Beta (MIRCERA IJ), 30 mcg., Disp: , Rfl:    mirtazapine  (REMERON ) 15 MG tablet, Take 15 mg by mouth daily. (Patient not taking: Reported on 07/28/2024), Disp: , Rfl:    montelukast  (SINGULAIR ) 10 MG tablet, Take 10 mg by mouth daily. (Patient not taking: Reported on 07/28/2024), Disp: , Rfl:    QUEtiapine  Fumarate (SEROQUEL  XR) 150 MG 24 hr tablet, Take 300 mg by mouth at bedtime., Disp: , Rfl:    sertraline  (ZOLOFT ) 100 MG tablet, TAKE 1 TABLET (100 MG TOTAL) BY MOUTH IN THE MORNING AND AT BEDTIME., Disp: 180 tablet, Rfl: 0   sevelamer  carbonate (RENVELA ) 800 MG tablet, Take 3 tablets (2,400 mg total) by mouth 3 (three) times daily with meals., Disp: 270 tablet, Rfl: 3   sildenafil (VIAGRA) 100 MG tablet, Take 100 mg by mouth as needed for erectile dysfunction., Disp: , Rfl:    timolol  (TIMOPTIC ) 0.5 % ophthalmic solution, Place 1 drop into both eyes every morning., Disp: 15 mL, Rfl: 0   Travoprost , BAK Free, (TRAVATAN ) 0.004 % SOLN ophthalmic solution, Place 1 drop into both eyes at bedtime. (Patient not taking: Reported on 07/28/2024), Disp: 15 mL, Rfl: 0   TYLENOL  500 MG tablet, Take 500-1,000 mg by mouth every 6 (six) hours as needed for mild pain (pain score 1-3) or headache., Disp: , Rfl:  Past Medical History:  Diagnosis Date   Anxiety    Arthritis    Chronic pancreatitis (HCC)    Depression    Dyspnea    ESRD (end stage renal disease) (HCC)    on HD (T,Th,Sat)   Glaucoma    Hypertension    Prostate cancer (HCC)    PUD (peptic ulcer disease)    Past Surgical History:  Procedure Laterality Date   APPENDECTOMY     BIOPSY  10/15/2023   Procedure: BIOPSY;  Surgeon: Leigh Elspeth SQUIBB, MD;  Location: [facility] ENDOSCOPY;  Service: Gastroenterology;;   DIALYSIS/PERMA CATHETER INSERTION N/A 09/23/2023   Procedure: DIALYSIS/PERMA CATHETER  INSERTION;  Surgeon: Melia Lynwood ORN, MD;  Location: [facility] INVASIVE CV LAB;  Service: Cardiovascular;  Laterality: N/A;   DIALYSIS/PERMA CATHETER REMOVAL N/A 09/23/2023   Procedure: DIALYSIS/PERMA CATHETER REMOVAL;  Surgeon: Melia Lynwood ORN, MD;  Location[facility] INVASIVE CV LAB;  Service: Cardiovascular;  Laterality: N/A;   ESOPHAGOGASTRODUODENOSCOPY (EGD) WITH PROPOFOL  N/A 10/15/2023   Procedure: ESOPHAGOGASTRODUODENOSCOPY (EGD) WITH PROPOFOL ;  Surgeon: Leigh Elspeth SQUIBB, MD;  Location: [facility] ENDOSCOPY;  Service: Gastroenterology;  Laterality: N/A;   growth in nostril     biopsied negatiove   IR FLUORO GUIDE CV LINE RIGHT  05/12/2020   IR FLUORO GUIDE CV LINE RIGHT  05/28/2020   IR FLUORO GUIDE CV LINE RIGHT  02/09/2024   IR REMOVAL TUN CV CATH W/O FL  02/07/2024   IR US  GUIDE VASC ACCESS RIGHT  05/12/2020   IR US  GUIDE VASC ACCESS RIGHT  05/28/2020   IR US  GUIDE VASC ACCESS RIGHT  02/09/2024   TRANSURETHRAL RESECTION OF PROSTATE N/A 09/13/2020   Procedure: TRANSURETHRAL RESECTION OF THE PROSTATE (TURP) WITH CYSTOSCOPY;  Surgeon: Renda Glance, MD;  Location: [facility] ORS;  Service: Urology;  Laterality: N/A;   TUNNELLED CATHETER EXCHANGE N/A 04/04/2024   Procedure: TUNNELLED CATHETER EXCHANGE;  Surgeon: Magda Debby SAILOR, MD;  Location: [facility] LAB;  Service: Cardiovascular;  Laterality: N/A;   Family History  Problem Relation Age of Onset   Stroke Mother    Breast cancer Neg Hx    Colon cancer Neg Hx    Prostate cancer Neg Hx    Pancreatic cancer Neg Hx    Social History   Socioeconomic History   Marital status: Divorced    Spouse name: Not on file   Number of children: Not on file   Years of education: Not on file   Highest education level: Not on file  Occupational History   Occupation: art gallery manager  Tobacco Use   Smoking status: Never    Passive exposure: Never   Smokeless tobacco: Never  Vaping Use   Vaping status: Never Used  Substance and Sexual Activity   Alcohol use: Not Currently     Alcohol/week: 1.0 standard drink of alcohol    Types: 1 Glasses of wine per week    Comment: rare   Drug use: Not Currently   Sexual activity: Not Currently    Birth control/protection: None  Other Topics Concern   Not on file  Social History Narrative   ** Merged History Encounter **       Right handed Drinks caffeine Lives here in [place] for 6 years, came from [place] Lives alone   Social Drivers of Health   Financial Resource Strain: Low Risk (11/10/2023)   Received from [facility]   Overall Financial Resource Strain (CARDIA)    Difficulty of Paying Living Expenses: Not hard at all  Food Insecurity:  No Food Insecurity (07/29/2024)   Hunger Vital Sign    Worried About Running Out of Food in the Last Year: Never true    Ran Out of Food in the Last Year: Never true  Transportation Needs: No Transportation Needs (07/29/2024)   PRAPARE - Administrator, Civil Service (Medical): No    Lack of Transportation (Non-Medical): No  Physical Activity: Sufficiently Active (08/21/2022)   [facility]   Exercise Vital Sign    On average, how many days per week do you engage in moderate to strenuous exercise (like a brisk walk)?: 4 days    On average, how many minutes do you engage in exercise at this level?: 50 min  Stress: No Stress Concern Present (02/02/2024)   Received from [facility] - Occupational Stress Questionnaire    Feeling of Stress : Only a little  Social Connections: Socially Isolated (07/29/2024)   Social Connection and Isolation Panel    Frequency of Communication with Friends and Family: Never    Frequency of Social Gatherings with Friends and Family: Never    Attends Religious Services: Never    Database Administrator or Organizations: No    Attends Banker Meetings: Never    Marital Status: Divorced  Catering Manager Violence: Not At Risk (07/29/2024)   Humiliation, Afraid,  Rape, and Kick questionnaire    Fear of Current or Ex-Partner: No    Emotionally Abused: No    Physically Abused: No    Sexually Abused: No       Objective:  There were no vitals taken for this visit. {Pulm Vitals (Optional):32837}  Physical Exam  Diagnostic Review:  {Labs (Optional):32838}   CT chest 07/28/24 Mild centrilobular and paraseptal emphysematous changes. Small bilateral lower lobe pulmonary nodules measuring up to 5 mm (images 82 and 89), unchanged from 2024, likely benign. No pleural effusion or pneumothorax.  CT chest 01/16/24 Stable 6 mm noncalcified pulmonary nodule within the left lower lobe, axial image # 96/4. Stable 6 mm subpleural right lower lobe pulmonary nodule when accounting for partial atelectasis of the right lower lobe, axial image # 103/4. Lungs a otherwise re clear. No pleural effusion or pneumothorax.  CT chest 10/09/23  No consolidation, pneumothorax or effusion. Minimal areas of scarring atelectatic changes identified. Mild lower lung bronchiectasis on the right. There is a calcified nodule right lower lobe on series 6, image 104 consistent with old granulomatous disease. Slight basilar atelectasis or scar as well. Noncalcified right lower lobe lung nodule measures 7 mm on series 6, image 93. 7 mm left lower lobe lung nodule noncalcified series 6, image 77 as well.    Assessment & Plan:   Assessment & Plan   Small bilateral lower lobe pulmonary nodules found incidentally during GI workups Never smoker Family history of rheumatoid arthritis No occupational exposures Plan We will do a 6 month follow up scan to monitor the two lung nodules in the bases of your lungs.  This will be due 07/2024. You will get a call to get this scheduled  closer to the time. You will follow up with me 1-2 weeks after the scan to review the results and determine next best steps.  Call if you need us  sooner.  Please contact office for sooner follow up if  symptoms do not improve or worsen or seek emergency care    No follow-ups on file.    Marny Patch, MD Pulmonary  and Critical Care Medicine [facility]

## 2024-09-19 ENCOUNTER — Ambulatory Visit

## 2024-09-19 VITALS — BP 123/71 | HR 99 | Temp 98.4°F | Ht 73.0 in | Wt 208.0 lb

## 2024-09-19 DIAGNOSIS — R918 Other nonspecific abnormal finding of lung field: Secondary | ICD-10-CM

## 2024-09-19 NOTE — Telephone Encounter (Signed)
 I called patient with pcp recommendations and patient stated he is not incontinent

## 2024-09-19 NOTE — Telephone Encounter (Signed)
 Patient had appointment today to discuss.NFN

## 2024-09-19 NOTE — Patient Instructions (Addendum)
 Dear [name];   I think your shortness of breath is related to the amount of fluid they are removing during dialysis sessions. I recommend to talk to your nephrologist to adjust that.   The ultrasound of your heart is normal. Your CT lung scan showed normal lungs, and the small nodules are unchanged. I will repeat a CT scan around 06/2025 to make sure they remain the same.   I will see you around September, after your CT scan results.

## 2024-09-19 NOTE — Telephone Encounter (Signed)
 I called patient with pcp recommendations

## 2024-09-20 ENCOUNTER — Telehealth (HOSPITAL_COMMUNITY): Payer: Self-pay | Admitting: Psychiatry

## 2024-09-20 NOTE — Telephone Encounter (Signed)
 D:  Alan Mcardle, NP referred pt to virtual MH-IOP.  A:  Placed call to orient pt, but there was no answer.  Left vm requesting patient to call the case manager back.

## 2024-09-20 NOTE — Telephone Encounter (Signed)
 D:  Pt returned the MH-IOP Case Mgr's phone call.  A:  Oriented pt.  Pt declined stating he is wanting individual therapy.  Transferred pt to the front desk.  R:  pt receptive.

## 2024-09-21 ENCOUNTER — Other Ambulatory Visit: Payer: Self-pay | Admitting: Family

## 2024-09-21 DIAGNOSIS — H409 Unspecified glaucoma: Secondary | ICD-10-CM

## 2024-09-22 NOTE — Telephone Encounter (Signed)
I called and spoke with patient.

## 2024-09-23 NOTE — Telephone Encounter (Signed)
 Please refer Assessment and Plan from office visit on 03/08/2024:  ASSESSMENT AND PLAN: Note - Plan of care discussed with Raguel Blush, MD.    1. Glaucoma of both eyes, unspecified glaucoma type (Primary) - Continue  Lumigan   ophthalmic solution and Timolol  ophthalmic solution as prescribed. Counseled on medication adherence/adverse effects. - I discussed with patient in detail no future refills of  Lumigan   ophthalmic solution and Timolol  ophthalmic solution will be prescribed and the importance of scheduling an appointment with Ophthalmology for continue care. Patient verbalized understanding and agreement. -  LUMIGAN   0.01 % SOLN; Place 1 drop into both eyes at bedtime.  Dispense: 7.5 mL; Refill: 0 - timolol  (TIMOPTIC ) 0.5 % ophthalmic solution; Place 1 drop into both eyes every morning.  Dispense: 15 mL; Refill: 0

## 2024-09-26 ENCOUNTER — Telehealth: Payer: Self-pay | Admitting: Emergency Medicine

## 2024-09-26 NOTE — Telephone Encounter (Unsigned)
 Copied from CRM [id number]. Topic: Clinical - Medication Refill >> Sep 26, 2024 11:11 AM Olam RAMAN wrote: Medication: QUEtiapine  Fumarate (SEROQUEL  XR) 150 MG 24 hr tablet   Has the patient contacted their pharmacy? Yes (Agent: If no, request that the patient contact the pharmacy for the refill. If patient does not wish to contact the pharmacy document the reason why and proceed with request.) (Agent: If yes, when and what did the pharmacy advise?)  This is the patient's preferred pharmacy:  [facility] - [place], [place] - [address] [place] [address] Phone: [phone] Fax: [phone]  Is this the correct pharmacy for this prescription? Yes If no, delete pharmacy and type the correct one.   Has the prescription been filled recently? Yes  Is the patient out of the medication? Yes  Has the patient been seen for an appointment in the last year OR does the patient have an upcoming appointment? Yes  Can we respond through MyChart? {yes/no:20286}  Agent: Please be advised that Rx refills may take up to 3 business days. We ask that you follow-up with your pharmacy.

## 2024-09-26 NOTE — Telephone Encounter (Signed)
 Requested medication (s) are due for refill today - unsure  Requested medication (s) are on the active medication list -yes  Future visit scheduled -yes  Last refill: lumigan - OV states patient needs f/u with ophthalmology                  Quetiapine - 11/27/23- listed as historical, non delegated Rx  Notes to clinic: see above  Requested Prescriptions  Pending Prescriptions Disp Refills   LUMIGAN  0.01 % SOLN [Pharmacy Med Name: Lumigan  0.01 % Ophthalmic Solution] 9 mL 0    Sig: INSTILL 1 DROP INTO EACH EYE AT BEDTIME     Ophthalmology:  Glaucoma Passed - 09/26/2024 11:24 AM      Passed - Valid encounter within last 12 months    Recent Outpatient Visits           3 weeks ago Hospital discharge follow-up   [facility], [place], [place]   2 months ago Anxiety and depression   [place] [facility] at [facility], Amy J, NP   4 months ago Essential hypertension   [place] [facility], MD   6 months ago Glaucoma of both eyes, unspecified glaucoma type   [facility], Amy J, NP   7 months ago Hospital discharge follow-up   [facility] at [facility], Amy J, NP               QUEtiapine  Fumarate (SEROQUEL  XR) 150 MG 24 hr tablet 30 tablet     Sig: Take 2 tablets (300 mg total) by mouth at bedtime.     Not Delegated - Psychiatry:  Antipsychotics - Second Generation (Atypical) - quetiapine  Failed - 09/26/2024 11:24 AM      Failed - This refill cannot be delegated      Failed - TSH in normal range and within 360 days    No results found for: TSH, POCTSH, TSHREFLEX       Failed - Lipid Panel in normal range within the last 12 months    Cholesterol  Date Value Ref Range Status  05/14/2023 140 0 - 200 mg/dL Final    Comment:    ATP III Classification       Desirable:  < 200 mg/dL               Borderline High:  200 - 239 mg/dL           High:  > = 759 mg/dL   LDL Cholesterol  Date Value Ref Range Status  05/14/2023 72 0 - 99 mg/dL Final   HDL  Date Value Ref Range Status  05/14/2023 45.00 >39.00 mg/dL Final   Triglycerides  Date Value Ref Range Status  05/14/2023 115.0 0.0 - 149.0 mg/dL Final    Comment:    Normal:  <150 mg/dLBorderline High:  150 - 199 mg/dL         Passed - Completed PHQ-2 or PHQ-9 in the last 360 days      Passed - Last BP in normal range    BP Readings from Last 1 Encounters:  09/19/24 123/71         Passed - Last Heart Rate in normal range    Pulse Readings from Last 1 Encounters:  09/19/24 99         Passed - Valid encounter within last 6 months  Recent Outpatient Visits           3 weeks ago Hospital discharge follow-up   [place] [facility] at [facility], [place] J, NP   2 months ago Anxiety and depression   [place] [facility] at [facility], Amy J, NP   4 months ago Essential hypertension   [place] [facility] at [facility], [place]   6 months ago Glaucoma of both eyes, unspecified glaucoma type   [place] [facility] at [facility] [place], NP   7 months ago Hospital discharge follow-up   [facility] at [facility], Amy J, NP              Passed - CBC within normal limits and completed in the last 12 months    WBC  Date Value Ref Range Status  08/02/2024 7.0 4.0 - 10.5 K/uL Final   RBC  Date Value Ref Range Status  08/02/2024 3.31 (L) 4.22 - 5.81 MIL/uL Final   Hemoglobin  Date Value Ref Range Status  08/02/2024 9.9 (L) 13.0 - 17.0 g/dL Final   HCT  Date Value Ref Range Status  08/02/2024 31.1 (L) 39.0 - 52.0 % Final   MCHC  Date Value Ref Range Status  08/02/2024 31.8 30.0 - 36.0 g/dL Final   [facility]  Date Value Ref Range Status  08/02/2024 29.9 26.0 - 34.0 pg Final   MCV  Date Value Ref Range Status  08/02/2024 94.0 80.0 - 100.0 fL Final   No  results found for: PLTCOUNTKUC, LABPLAT, POCPLA RDW  Date Value Ref Range Status  08/02/2024 15.0 11.5 - 15.5 % Final         Passed - CMP within normal limits and completed in the last 12 months    Albumin   Date Value Ref Range Status  08/02/2024 2.7 (L) 3.5 - 5.0 g/dL Final   Alkaline Phosphatase  Date Value Ref Range Status  07/29/2024 74 38 - 126 U/L Final   ALT  Date Value Ref Range Status  07/29/2024 8 0 - 44 U/L Final   AST  Date Value Ref Range Status  07/29/2024 14 (L) 15 - 41 U/L Final   BUN  Date Value Ref Range Status  08/02/2024 66 (H) 8 - 23 mg/dL Final   Calcium   Date Value Ref Range Status  08/02/2024 9.0 8.9 - 10.3 mg/dL Final   Calcium , Ion  Date Value Ref Range Status  10/15/2023 1.14 (L) 1.15 - 1.40 mmol/L Final   CO2  Date Value Ref Range Status  08/02/2024 21 (L) 22 - 32 mmol/L Final   Bicarbonate  Date Value Ref Range Status  05/11/2020 25.4 20.0 - 28.0 mmol/L Final   TCO2  Date Value Ref Range Status  10/15/2023 24 22 - 32 mmol/L Final   Creatinine, Ser  Date Value Ref Range Status  08/02/2024 8.67 (H) 0.61 - 1.24 mg/dL Final   Glucose, Bld  Date Value Ref Range Status  08/02/2024 86 70 - 99 mg/dL Final    Comment:    Glucose reference range applies only to samples taken after fasting for at least 8 hours.   Glucose-Capillary  Date Value Ref Range Status  07/28/2024 107 (H) 70 - 99 mg/dL Final    Comment:    Glucose reference range applies only to samples taken after fasting for at least 8 hours.   Potassium  Date Value Ref Range Status  08/02/2024 4.6 3.5 - 5.1 mmol/L Final   Sodium  Date Value Ref Range Status  08/02/2024 136 135 - 145 mmol/L Final   Total Bilirubin  Date Value Ref Range Status  07/29/2024 0.8 0.0 - 1.2 mg/dL Final   Protein, ur  Date Value Ref Range Status  07/31/2024 100 (A) NEGATIVE mg/dL Final   Total Protein  Date Value Ref Range Status  07/29/2024 6.6 6.5 - 8.1 g/dL Final   GFR  calc Af Amer  Date Value Ref Range Status  06/12/2020 7 (L) >60 mL/min Final   GFR, Estimated  Date Value Ref Range Status  08/02/2024 6 (L) >60 mL/min Final    Comment:    (NOTE) Calculated using the CKD-EPI Creatinine Equation (2021)             Requested Prescriptions  Pending Prescriptions Disp Refills   LUMIGAN  0.01 % SOLN [Pharmacy Med Name: Lumigan  0.01 % Ophthalmic Solution] 9 mL 0    Sig: INSTILL 1 DROP INTO EACH EYE AT BEDTIME     Ophthalmology:  Glaucoma Passed - 09/26/2024 11:24 AM      Passed - Valid encounter within last 12 months    Recent Outpatient Visits           3 weeks ago Hospital discharge follow-up   [facility], [place], [place]   2 months ago Anxiety and depression   [place] [facility] at [facility], [place], [place]   4 months ago Essential hypertension   [place] [facility] at [facility], MD   6 months ago Glaucoma of both eyes, unspecified glaucoma type   [facility], [place], [place]   7 months ago Hospital discharge follow-up   [facility] at [facility], Amy J, NP               QUEtiapine  Fumarate (SEROQUEL  XR) 150 MG 24 hr tablet 30 tablet     Sig: Take 2 tablets (300 mg total) by mouth at bedtime.     Not Delegated - Psychiatry:  Antipsychotics - Second Generation (Atypical) - quetiapine  Failed - 09/26/2024 11:24 AM      Failed - This refill cannot be delegated      Failed - TSH in normal range and within 360 days    No results found for: TSH, POCTSH, TSHREFLEX       Failed - Lipid Panel in normal range within the last 12 months    Cholesterol  Date Value Ref Range Status  05/14/2023 140 0 - 200 mg/dL Final    Comment:    ATP III Classification       Desirable:  < 200 mg/dL               Borderline High:  200 - 239 mg/dL          High:  > = 759 mg/dL   LDL Cholesterol  Date Value Ref Range  Status  05/14/2023 72 0 - 99 mg/dL Final   HDL  Date Value Ref Range Status  05/14/2023 45.00 >39.00 mg/dL Final   Triglycerides  Date Value Ref Range Status  05/14/2023 115.0 0.0 - 149.0 mg/dL Final    Comment:    Normal:  <150 mg/dLBorderline High:  150 - 199 mg/dL         Passed - Completed PHQ-2 or PHQ-9 in the last 360  days      Passed - Last BP in normal range    BP Readings from Last 1 Encounters:  09/19/24 123/71         Passed - Last Heart Rate in normal range    Pulse Readings from Last 1 Encounters:  09/19/24 99         Passed - Valid encounter within last 6 months    Recent Outpatient Visits           3 weeks ago Hospital discharge follow-up   [facility] [place], Amy J, NP   2 months ago Anxiety and depression   [place] [facility] at [facility], Amy J, NP   4 months ago Essential hypertension   [place] [facility] at [facility], [place]   6 months ago Glaucoma of both eyes, unspecified glaucoma type   [place] [facility] at [facility], Amy J, NP   7 months ago Hospital discharge follow-up   [facility], Amy J, NP              Passed - CBC within normal limits and completed in the last 12 months    WBC  Date Value Ref Range Status  08/02/2024 7.0 4.0 - 10.5 K/uL Final   RBC  Date Value Ref Range Status  08/02/2024 3.31 (L) 4.22 - 5.81 MIL/uL Final   Hemoglobin  Date Value Ref Range Status  08/02/2024 9.9 (L) 13.0 - 17.0 g/dL Final   HCT  Date Value Ref Range Status  08/02/2024 31.1 (L) 39.0 - 52.0 % Final   MCHC  Date Value Ref Range Status  08/02/2024 31.8 30.0 - 36.0 g/dL Final   [facility]  Date Value Ref Range Status  08/02/2024 29.9 26.0 - 34.0 pg Final   MCV  Date Value Ref Range Status  08/02/2024 94.0 80.0 - 100.0 fL Final   No results found for: PLTCOUNTKUC, LABPLAT, POCPLA RDW  Date Value Ref  Range Status  08/02/2024 15.0 11.5 - 15.5 % Final         Passed - CMP within normal limits and completed in the last 12 months    Albumin   Date Value Ref Range Status  08/02/2024 2.7 (L) 3.5 - 5.0 g/dL Final   Alkaline Phosphatase  Date Value Ref Range Status  07/29/2024 74 38 - 126 U/L Final   ALT  Date Value Ref Range Status  07/29/2024 8 0 - 44 U/L Final   AST  Date Value Ref Range Status  07/29/2024 14 (L) 15 - 41 U/L Final   BUN  Date Value Ref Range Status  08/02/2024 66 (H) 8 - 23 mg/dL Final   Calcium   Date Value Ref Range Status  08/02/2024 9.0 8.9 - 10.3 mg/dL Final   Calcium , Ion  Date Value Ref Range Status  10/15/2023 1.14 (L) 1.15 - 1.40 mmol/L Final   CO2  Date Value Ref Range Status  08/02/2024 21 (L) 22 - 32 mmol/L Final   Bicarbonate  Date Value Ref Range Status  05/11/2020 25.4 20.0 - 28.0 mmol/L Final   TCO2  Date Value Ref Range Status  10/15/2023 24 22 - 32 mmol/L Final   Creatinine, Ser  Date Value Ref Range Status  08/02/2024 8.67 (H) 0.61 - 1.24 mg/dL Final   Glucose, Bld  Date Value Ref Range Status  08/02/2024 86 70 - 99 mg/dL Final  Comment:    Glucose reference range applies only to samples taken after fasting for at least 8 hours.   Glucose-Capillary  Date Value Ref Range Status  07/28/2024 107 (H) 70 - 99 mg/dL Final    Comment:    Glucose reference range applies only to samples taken after fasting for at least 8 hours.   Potassium  Date Value Ref Range Status  08/02/2024 4.6 3.5 - 5.1 mmol/L Final   Sodium  Date Value Ref Range Status  08/02/2024 136 135 - 145 mmol/L Final   Total Bilirubin  Date Value Ref Range Status  07/29/2024 0.8 0.0 - 1.2 mg/dL Final   Protein, ur  Date Value Ref Range Status  07/31/2024 100 (A) NEGATIVE mg/dL Final   Total Protein  Date Value Ref Range Status  07/29/2024 6.6 6.5 - 8.1 g/dL Final   GFR calc Af Amer  Date Value Ref Range Status  06/12/2020 7 (L) >60 mL/min  Final   GFR, Estimated  Date Value Ref Range Status  08/02/2024 6 (L) >60 mL/min Final    Comment:    (NOTE) Calculated using the CKD-EPI Creatinine Equation (2021)

## 2024-09-26 NOTE — Telephone Encounter (Unsigned)
 Copied from CRM [id number]. Topic: Clinical - Prescription Issue >> Sep 26, 2024  2:12 PM Sophia H wrote: Reason for CRM: Patient called in upset, states that he requested a fill on his QUEtiapine  Fumarate (SEROQUEL  XR) 150 MG 24 hr tablet  - was told by someone else that PCP will not prescribe as she is not the original prescriber. Patient states pharmacy told him that PCP Amy Jaycee did send in a refill for him before, wanting to know if she will fill for him or not. Wants to speak directly to the clinic, does not want to speak with [id number], patient disconnected line. # [phone]

## 2024-09-27 NOTE — Telephone Encounter (Signed)
 Please refer Assessment and Plan from office visit on 03/08/2024:  ASSESSMENT AND PLAN: Note - Plan of care discussed with Raguel Blush, MD.    1. Glaucoma of both eyes, unspecified glaucoma type (Primary) - Continue  Lumigan   ophthalmic solution and Timolol  ophthalmic solution as prescribed. Counseled on medication adherence/adverse effects. - I discussed with patient in detail no future refills of  Lumigan   ophthalmic solution and Timolol  ophthalmic solution will be prescribed and the importance of scheduling an appointment with Ophthalmology for continue care. Patient verbalized understanding and agreement. -  LUMIGAN   0.01 % SOLN; Place 1 drop into both eyes at bedtime.  Dispense: 7.5 mL; Refill: 0 - timolol  (TIMOPTIC ) 0.5 % ophthalmic solution; Place 1 drop into both eyes every morning.  Dispense: 15 mL; Refill: 0

## 2024-09-27 NOTE — Telephone Encounter (Signed)
 Please refer to my note from 09/27/24 11:32 AM.

## 2024-09-27 NOTE — Telephone Encounter (Signed)
 Quetiapine  appears as a historical medication. Report to Emergency Department/Urgent Care/call 911 for immediate medical evaluation. Patient may also consider requesting from established Psychiatry (last office visit 05/24/2024).

## 2024-09-29 NOTE — Telephone Encounter (Signed)
 Noted

## 2024-09-29 NOTE — Telephone Encounter (Signed)
 Please refer to my note from 09/27/24 11:32 AM.

## 2024-10-04 ENCOUNTER — Other Ambulatory Visit: Payer: Self-pay | Admitting: Family

## 2024-10-04 DIAGNOSIS — H409 Unspecified glaucoma: Secondary | ICD-10-CM

## 2024-10-18 ENCOUNTER — Ambulatory Visit: Admitting: Orthopedic Surgery

## 2024-10-18 ENCOUNTER — Other Ambulatory Visit: Payer: Self-pay

## 2024-10-18 VITALS — BP 154/79 | HR 102 | Ht 73.0 in | Wt 208.0 lb

## 2024-10-18 DIAGNOSIS — M8000XA Age-related osteoporosis with current pathological fracture, unspecified site, initial encounter for fracture: Secondary | ICD-10-CM | POA: Diagnosis not present

## 2024-10-18 DIAGNOSIS — S22080A Wedge compression fracture of T11-T12 vertebra, initial encounter for closed fracture: Secondary | ICD-10-CM | POA: Diagnosis not present

## 2024-10-18 NOTE — Progress Notes (Signed)
 Orthopedic Spine Surgery Office Note  Assessment: Patient is a 74 y.o. male with asymptomatic thoracic compression fractures   Plan: -Patient does have a history of prostate cancer with no known metastases.  He is not having any red flag symptoms.  He has been followed by urology.  Since he is not having any pain or red flag symptoms, I am not concerned about metastatic disease at this point.  Told him to come back if he does develop pain and we would investigate that further as that is a potential cause of a atraumatic compression fracture -I told him that his compression fracture from no history of trauma is consistent with osteoporosis.  This is diagnostic fracture.  I ordered a DEXA scan purely for monitoring purposes.  I placed a referral to our osteoporosis clinic -Patient should return to office on an as needed basis   Patient expressed understanding of the plan and all questions were answered to the patient's satisfaction.   ___________________________________________________________________________   History:  Patient is a 74 y.o. male who presents today for thoracic spine.  Patient reports that he was getting a CT for other reasons when 2 compression fractures were found on his thoracic spine.  He told doctors came in with braces and he told me that he needed to wear that.  He refused the braces because he was not having any pain or issues with the spine.  He comes in today for follow-up on these fractures.  He is not having any pain in his back.  He is not having any radiating leg pain.  He is not taking any medications for pain.   Weakness: Denies Symptoms of imbalance: Denies Paresthesias and numbness: Denies Bowel or bladder incontinence: Denies Saddle anesthesia: Denies  Treatments tried: None  Review of systems: Denies fevers and chills, night sweats, unexplained weight loss, pain that wakes him at night. Has a history of prostate cancer  Past medical  history: Depression/anxiety Dyspnea Glaucoma HTN Prostate cancer ESRD  Allergies: NKDA  Past surgical history:  Appendectomy  Social history: Denies use of nicotine product (smoking, vaping, patches, smokeless) Alcohol use: Yes, approximately 1 drink per week Denies recreational drug use   Physical Exam:  BMI of 27.4  General: no acute distress, appears stated age Neurologic: alert, answering questions appropriately, following commands Respiratory: unlabored breathing on room air, symmetric chest rise Psychiatric: appropriate affect, normal cadence to speech   MSK (spine):  -Strength exam      Left  Right EHL    5/5  5/5 TA    5/5  5/5 GSC    5/5  5/5 Knee extension  5/5  5/5 Hip flexion   5/5  5/5  -Sensory exam    Sensation intact to light touch in L3-S1 nerve distributions of bilateral lower extremities   Imaging: XRs of the thoracic spine from 10/18/2024 were independently reviewed and interpreted, showing compression fracture at T12.  No other compression fracture seen.  No dislocation seen.  CT of the abdomen and pelvis from 07/2024 showed compression fractures at T10 and T12.  No lucency seen within the vertebra.  No hyperdensity seen within the vertebrae either    Patient name: Manuel Ortega Patient MRN: [id number] Date of visit: 10/18/2024

## 2024-11-02 ENCOUNTER — Encounter: Admitting: Family

## 2024-11-02 NOTE — Progress Notes (Signed)
 Erroneous encounter-disregard

## 2024-11-14 ENCOUNTER — Encounter: Admitting: Physician Assistant

## 2024-11-16 NOTE — Telephone Encounter (Signed)
 Recommendation to refer to adult catch-up vaccination schedule for measles.

## 2024-11-16 NOTE — Telephone Encounter (Signed)
 Communication  Never had a measles vaccine as a child and would like to know if he should have one now as an adult especially with the rise in cases

## 2024-11-17 ENCOUNTER — Encounter (HOSPITAL_BASED_OUTPATIENT_CLINIC_OR_DEPARTMENT_OTHER): Payer: Self-pay

## 2024-11-17 ENCOUNTER — Ambulatory Visit: Payer: Self-pay

## 2024-11-17 ENCOUNTER — Ambulatory Visit
Admission: RE | Admit: 2024-11-17 | Discharge: 2024-11-17 | Disposition: A | Discharge status: Home / Self Care | Source: Ambulatory Visit

## 2024-11-17 ENCOUNTER — Emergency Department (HOSPITAL_BASED_OUTPATIENT_CLINIC_OR_DEPARTMENT_OTHER)

## 2024-11-17 ENCOUNTER — Emergency Department (HOSPITAL_BASED_OUTPATIENT_CLINIC_OR_DEPARTMENT_OTHER)
Admission: EM | Admit: 2024-11-17 | Discharge: 2024-11-17 | Disposition: A | Discharge status: Home / Self Care | Source: Home / Self Care | Attending: Emergency Medicine | Admitting: Emergency Medicine

## 2024-11-17 ENCOUNTER — Other Ambulatory Visit: Payer: Self-pay

## 2024-11-17 VITALS — BP 142/79 | HR 104 | Temp 99.4°F | Resp 18

## 2024-11-17 DIAGNOSIS — M25522 Pain in left elbow: Secondary | ICD-10-CM

## 2024-11-17 DIAGNOSIS — W19XXXA Unspecified fall, initial encounter: Secondary | ICD-10-CM

## 2024-11-17 DIAGNOSIS — S52135A Nondisplaced fracture of neck of left radius, initial encounter for closed fracture: Secondary | ICD-10-CM

## 2024-11-17 HISTORY — DX: Dependence on renal dialysis: Z99.2

## 2024-11-17 MED ORDER — ACETAMINOPHEN 325 MG PO TABS
650.0000 mg | ORAL_TABLET | Freq: Once | ORAL | Status: AC
  Administered 2024-11-17: 650 mg via ORAL
  Filled 2024-11-17: qty 2

## 2024-11-17 MED ORDER — OXYCODONE-ACETAMINOPHEN 5-325 MG PO TABS: 1.0000 | ORAL_TABLET | Freq: Four times a day (QID) | ORAL | 0 refills | Status: AC | PRN

## 2024-11-17 NOTE — ED Provider Notes (Signed)
 " Manuel Ortega Provider Note   Manuel Ortega Arrival date & time: 11/17/24  9277     Patient presents with: [name]   Manuel Ortega is a 74 y.o. male.    Fall   74 year old male with a history of end-stage renal disease on dialysis presents after a fall.  He was walking to the dialysis center and slipped on black ice, he fell and landed on his left side.  He complains of pain at the left cheek and left elbow.  He did not lose consciousness and does not take a blood thinner.  No neck or back pain.  Ambulatory with no pain.  No neurologic symptoms.    Prior to Admission medications  Medication Sig Start Date End Date Taking? Authorizing Provider  calcitRIOL  (ROCALTROL ) 0.25 MCG capsule Take 5 capsules (1.25 mcg total) by mouth Every Tuesday,Thursday,and Saturday with dialysis. Patient not taking: Reported on 09/19/2024 02/11/24   Elgergawy, Brayton RAMAN, MD  cinacalcet  (SENSIPAR ) 30 MG tablet Take 1 tablet (30 mg total) by mouth Every Tuesday,Thursday,and Saturday with dialysis. Patient not taking: Reported on 09/19/2024 02/11/24   Elgergawy, Brayton RAMAN, MD  CREON  [phone] units CPEP capsule Take 36,000-72,000 Units by mouth See admin instructions. Take 72,000 units by mouth three time a day with meals and 36,000 units three times a day with snacks    [provider]  cyanocobalamin  1000 MCG tablet Take 1 tablet (1,000 mcg total) by mouth daily. 08/03/24   Patsy Lenis, MD  diphenhydrAMINE  (BENADRYL ) 25 mg capsule Take 25 mg by mouth every 4 (four) hours as needed for itching or allergies.    [provider]  ferrous sulfate  325 (65 FE) MG EC tablet Take 325 mg by mouth daily with breakfast.    [provider]  FLUoxetine  (PROZAC ) 10 MG capsule Take 1 capsule (10 mg total) by mouth daily. 08/24/24   Jaycee Greig PARAS, NP  LUMIGAN  0.01 % SOLN Place 1 drop into both eyes at bedtime. 03/08/24   Jaycee Greig PARAS, NP  Methoxy  PEG-Epoetin Beta (MIRCERA IJ) 30 mcg. 02/16/24 02/14/25  [provider]  mirtazapine  (REMERON ) 15 MG tablet Take 15 mg by mouth daily. 11/22/23   [provider]  montelukast  (SINGULAIR ) 10 MG tablet Take 10 mg by mouth daily. Patient not taking: Reported on 09/19/2024 12/18/23   [provider]  pantoprazole  (PROTONIX ) 40 MG tablet Take 40 mg by mouth 2 (two) times daily.    [provider]  QUEtiapine  Fumarate (SEROQUEL  XR) 150 MG 24 hr tablet Take 300 mg by mouth at bedtime. Patient taking differently: Take 300 mg by mouth at bedtime. 150 at pm 11/27/23   [provider]  sertraline  (ZOLOFT ) 100 MG tablet TAKE 1 TABLET (100 MG TOTAL) BY MOUTH IN THE MORNING AND AT BEDTIME. Patient not taking: Reported on 09/19/2024 09/12/24 12/11/24  Jaycee Greig PARAS, NP  sevelamer  carbonate (RENVELA ) 800 MG tablet Take 3 tablets (2,400 mg total) by mouth 3 (three) times daily with meals. Patient not taking: Reported on 09/19/2024 08/03/24   Patsy Lenis, MD  sildenafil (VIAGRA) 100 MG tablet Take 100 mg by mouth as needed for erectile dysfunction. 12/17/23   [provider]  timolol  (TIMOPTIC ) 0.5 % ophthalmic solution Place 1 drop into both eyes every morning. 05/17/24   Tanda Bleacher, MD  Travoprost , BAK Free, (TRAVATAN ) 0.004 % SOLN ophthalmic solution Place 1 drop into both eyes at bedtime. 05/17/24   Tanda Bleacher,  MD  TYLENOL  500 MG tablet Take 500-1,000 mg by mouth every 6 (six) hours as needed for mild pain (pain score 1-3) or headache.    [provider]    Allergies: Patient has no known allergies.    Review of Systems Positive for left cheek and left elbow pain Updated Vital Signs BP (!) 156/86   Pulse 77   Temp 97.7 F (36.5 C) (Oral)   Resp 18   Ht 6' 1 (1.854 m)   Wt 86.2 kg   SpO2 99%   BMI 25.07 kg/m   Physical Exam Vitals and nursing note reviewed.  Constitutional:      General: He is not in acute distress.    Appearance: He is  well-developed.  HENT:     Head: Normocephalic.     Comments: Slight redness and tenderness of the left cheek with no bony Ortega tenderness.    Nose: Nose normal.     Mouth/Throat:     Mouth: Mucous membranes are moist.     Comments: No loose teeth or jaw malocclusion Eyes:     Conjunctiva/sclera: Conjunctivae normal.     Pupils: Pupils are equal, round, and reactive to light.  Neck:     Comments: No midline tenderness Cardiovascular:     Rate and Rhythm: Normal rate and regular rhythm.     Heart sounds: No murmur heard. Pulmonary:     Effort: Pulmonary effort is normal. No respiratory distress.     Breath sounds: Normal breath sounds.  Abdominal:     Palpations: Abdomen is soft.     Tenderness: There is no abdominal tenderness.  Musculoskeletal:        General: Swelling present.     Cervical back: Normal range of motion and neck supple.     Comments: Slight swelling and tenderness of left elbow with good range of motion.  Neurovascular intact.  Normal remainder of left upper extremity exam.  Skin:    General: Skin is warm and dry.     Capillary Refill: Capillary refill takes less than 2 seconds.  Neurological:     General: No focal deficit present.     Mental Status: He is alert and oriented to person, place, and time.  Psychiatric:        Mood and Affect: Mood normal.     (all labs ordered are listed, but only abnormal results are displayed) Labs Reviewed - No data to display  EKG: None  Radiology: CT Cervical Spine Wo Contrast Result Date: 11/17/2024 EXAM: CT CERVICAL SPINE WITHOUT CONTRAST 11/17/2024 08:43:00 AM TECHNIQUE: CT of the cervical spine was performed without the administration of intravenous contrast. Multiplanar reformatted images are provided for review. Automated exposure control, iterative reconstruction, and/or weight based adjustment of the mA/kV was utilized to reduce the radiation dose to as low as reasonably achievable. COMPARISON: CT head and face  reported separately today. CT chest, abdomen, and pelvis 07/28/2024. CLINICAL HISTORY: 74 year old male. Neck trauma. Fell on ice while going to dialysis. FINDINGS: BONES AND ALIGNMENT: Straightening of cervical lordosis. Mild T2 inferior endplate compression is chronic and stable. No acute fracture or traumatic malalignment. DEGENERATIVE CHANGES: Advanced chronic disc and endplate degeneration throughout the cervical spine. Subsequent multifactorial spinal stenosis appears maximal at C3-C4, likely moderate (series 5, image 53). SOFT TISSUES: Right lower neck, right IJ associated dual lumen dialysis type catheter is partially visible. Otherwise negative visible non-contrast thoracic inlet. Carotid atherosclerosis. Negative visible non-contrast deep soft tissue spaces of the neck. No  prevertebral soft tissue swelling. IMPRESSION: 1. No acute traumatic injury identified in the cervical spine. 2. Advanced chronic disc and endplate degeneration with likely moderate spinal stenosis at C3-C4. Electronically signed by: Helayne Hurst MD 11/17/2024 09:04 AM EST RP Workstation: [id number]   CT Maxillofacial WO CM Result Date: 11/17/2024 EXAM: CT OF THE FACE WITHOUT CONTRAST 11/17/2024 08:43:00 AM TECHNIQUE: CT of the face was performed without the administration of intravenous contrast. Multiplanar reformatted images are provided for review. Automated exposure control, iterative reconstruction, and/or weight based adjustment of the mA/kV was utilized to reduce the radiation dose to as low as reasonably achievable. COMPARISON: CT head and cervical spine reported separately today. CT paranasal sinuses 02/04/2024. CLINICAL HISTORY: 74 year old male. Fell on ice while going to dialysis. Head trauma, moderate-severe. FINDINGS: FACIAL BONES: No acute osseous abnormality identified. Orbital walls appear intact. ORBITS: Globes are intact. No acute traumatic injury. No inflammatory change. SINUSES AND MASTOIDS: Advanced chronic  paranasal sinus disease with subtotal opacification due to mucoperiosteal thickening in the left maxillary and right frontal sinuses not changed from last year. No superimposed layering sinus hemorrhage is identified. There is an unchanged small volume inflammatory appearing layering right maxillary sinus fluid. Visible tympanic cavities and mastoids are aerated. SOFT TISSUES: Broad based subcutaneous hematoma / contusion overlying the left face below the malar eminence (series 3 image 59). Underlying left masticator space remains within normal limits. Negative visible non-contrast larynx, pharynx, peripharyngeal spaces, retropharyngeal space, sublingual space, submandibular spaces, masticator and parotid spaces. No soft tissue gas. IMPRESSION: 1. Subcutaneous hematoma/contusion of the left face. 2. No acute facial fracture. 3. Advanced chronic paranasal sinus disease stable from last year. Electronically signed by: Helayne Hurst MD 11/17/2024 08:59 AM EST RP Workstation: [id number]   DG Elbow Complete Left Result Date: 11/17/2024 EXAM: 4 VIEW(S) XRAY OF THE LEFT ELBOW COMPARISON: None available. CLINICAL HISTORY: 74 year old male. Fell on ice while going to dialysis. FINDINGS: BONES AND JOINTS: Subtle cortical disruption of the radial neck. No malalignment. SOFT TISSUES: Small elbow joint effusion. Otherwise unremarkable. IMPRESSION: 1. Nondisplaced left radial neck fracture.  Small left elbow joint effusion. Electronically signed by: Helayne Hurst MD 11/17/2024 08:55 AM EST RP Workstation: [id number]   CT Head Wo Contrast Result Date: 11/17/2024 EXAM: CT HEAD WITHOUT CONTRAST 11/17/2024 08:43:00 AM TECHNIQUE: CT of the head was performed without the administration of intravenous contrast. Automated exposure control, iterative reconstruction, and/or weight based adjustment of the mA/kV was utilized to reduce the radiation dose to as low as reasonably achievable. COMPARISON: CT face and cervical spine reported  separately today. Brain MRI 05/05/2023. CLINICAL HISTORY: 74 year old male. Head trauma, moderate-severe. Fell on ice while going to dialysis. FINDINGS: BRAIN AND VENTRICLES: No acute hemorrhage. No evidence of acute infarct. No hydrocephalus. No extra-axial collection. No mass effect or midline shift. Brain volume is stable since November 28, 2022. Patchy bilateral cerebral white matter hypodensity is moderate and stable. No suspicious intracranial vascular hyperdensity. Calcified atherosclerosis at the skull base. ORBITS: No acute abnormality. SINUSES: Chronic paranasal sinus disease appears stable since 28-Nov-2022. Pronounced chronic mucoperiosteal thickening in the left maxillary and right frontal sinuses. Tympanic cavities and mastoids are well aerated. SOFT TISSUES AND SKULL: No acute soft tissue abnormality. No skull fracture. IMPRESSION: 1. No acute traumatic injury identified. 2. 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 3. No new intracranial abnormality. Electronically signed by: Helayne Hurst MD 11/17/2024 08:54 AM EST RP Workstation: [id number]     .Ortho Injury Treatment  Date/Time: 11/17/2024 10:09 AM  Performed by:  Armando Bukhari Hima, MD Authorized by: Fredia Rosette Kirsch, MD   Consent:    Consent obtained:  Verbal   Consent given by:  PatientInjury location: elbow Location details: left elbow Injury type: fracture Fracture type: radial neck Pre-procedure neurovascular assessment: neurovascularly intact Pre-procedure distal perfusion: normal Pre-procedure neurological function: normal Pre-procedure range of motion: reduced  Anesthesia: Local anesthesia used: no  Patient sedated: NoManipulation performed: no Immobilization: splint Splint Applied by: ED Tech Supplies used: cotton padding, elastic bandage and Ortho-Glass Post-procedure neurovascular assessment: post-procedure neurovascularly intact Post-procedure distal perfusion: normal Post-procedure neurological function:  normal Post-procedure range of motion: unchanged      Medications Ordered in the ED  acetaminophen  (TYLENOL ) tablet 650 mg (650 mg Oral Given 11/17/24 0818)                                    Medical Decision Making Amount and/or Complexity of Data Reviewed Radiology: ordered.  Risk OTC drugs.   74 year old male, end-stage renal disease presents after mechanical fall.  Has an isolated left radial neck fracture that is closed and neurovascular intact.  Other imaging is negative for acute injury and I have discussed chronic findings with the patient.  Splint applied, see above procedure note.  Patient will follow-up with his dialysis doctor to reschedule.  Here appears well with no evidence of emergent need for dialysis.  All results reviewed with patient.  All questions answered.  Careful return precautions have been given.  Close follow-up advised. Patient is happy with plan and requests discharge.      Final diagnoses:  Fall, initial encounter  Closed nondisplaced fracture of neck of left radius, initial encounter    ED Discharge Orders     None          Fredia Rosette Kirsch, MD 11/17/24 1013  "

## 2024-11-17 NOTE — Telephone Encounter (Signed)
 FYI Only or Action Required?: FYI only for provider: Going to UC today.  Patient was last seen in primary care on 09/02/2024 by Jaycee Greig PARAS, NP.  Called Nurse Triage reporting Elbow Injury.  Symptoms began today.  Interventions attempted: Nothing.  Symptoms are: gradually worsening.  Triage Disposition: See HCP Within 4 Hours (Or PCP Triage)  Patient/caregiver understands and will follow disposition?: Yes   Pt slipped and fell on ice in dialysis parking lot this morning. Hit left side side and left elbow. Went to ED today, xrays show fractured elbow. No pain at that time. Split applied. Pain now 9/10, constant thumping pain, radiates down to hand. No numbness in hand. Appt scheduled for UC today. Advised ED for worsening symptoms.    Called pt, LVMTCB d/t disconnected prior to NT transfer.   Reason for Triage: PT Went to Emergency and had xray done on LT Elbow due to fall from slippery ice. States now in extreme amount of pain in LT elbow. Everything happened 11/17/24    Reason for Disposition  [1] SEVERE pain (e.g., excruciating) AND [2] not improved 2 hours after pain medicine/ice packs  Answer Assessment - Initial Assessment Questions 1. MECHANISM: How did the injury happen?     Slipped on ice  2. ONSET: When did the injury happen? (e.g., minutes, hours ago)      This morning  3. LOCATION: What part of the elbow is injured?      Left elbow  4. APPEARANCE of INJURY: What does the injury look like?      Unsure arm is splinted in a sling  5. SEVERITY: Can you use the elbow normally?  Can you bend it and straighten it fully?     Arm is splinted in a sling  6. SIZE: For cuts, bruises, or swelling, ask: How large is it? (e.g., inches or centimeters; entire joint)      Unsure arm is splinted in a sling  7. PAIN: Is there pain? If Yes, ask: How bad is the pain?  (Scale 0-10; or none, mild, moderate, severe)     9/10, constant thumping pain  8.  TETANUS: For any breaks in the skin, ask: When was your last tetanus booster?     No breaks in the skin  9. OTHER SYMPTOMS: Do you have any other symptoms?  (e.g., numbness in hand)     No  Protocols used: Elbow Injury-A-AH

## 2024-11-17 NOTE — Discharge Instructions (Addendum)
 Please schedule Tylenol  at 500 mg - 650 mg once every 6 hours as needed for aches and pains.  If you still have pain despite taking Tylenol  regularly, this is breakthrough pain.  You can use oxycodone  once every 6 hours for this.  Once your pain is better controlled, switch back to just Tylenol .

## 2024-11-17 NOTE — ED Triage Notes (Signed)
 Pt reports pain in the left elbow. Reports he fell on ice this morning when he was on his way to dialysis. Pt was seeing at the ED today. Pt fractured the left elbow.

## 2024-11-17 NOTE — Discharge Instructions (Signed)
 You were seen in the emergency department after a fall.  Your brain, face, and neck imaging were negative for any acute injury but did show some chronic findings you should discuss with your primary care doctor.  You have a fracture of your left elbow and we placed you in a splint.  I am referring you to orthopedics.  Ice your elbow for 10 minutes every 1-2 hours while awake.  Elevate above heart while at rest.  Use Tylenol  as directed for any pain.

## 2024-11-17 NOTE — ED Triage Notes (Addendum)
 Pt arrives with complaints of falling this morning. Pt was walking into the dialysis center and landed on his left side of this face. Pt voices  pain on face and left arm. Pt states that he did not loose consciousness.

## 2024-11-17 NOTE — Telephone Encounter (Signed)
 Called pt, LVMTCB d/t disconnected prior to NT transfer.   Reason for Triage: PT Went to Emergency and had xray done on LT Elbow due to fall from slippery ice. States now in extreme amount of pain in LT elbow. Everything happened 11/17/24

## 2024-11-17 NOTE — ED Provider Notes (Signed)
 " Producer, Television/film/video - [facility]  Note:  This document was prepared using Conservation officer, historic buildings and may include unintentional dictation errors.  MRN: [id number] DOB: 17-Aug-1951  Subjective:   Manuel Ortega is a 74 y.o. male with PMH of ESRD on dialysis presenting for acute onset of left arm/elbow pain.  Patient was seen through the emergency room overnight.  Was discharged without any prescriptions for pain control despite having a closed nondisplaced fracture of the neck of the left radius.  Patient was splinted.  Presents today for pain management.  Current Outpatient Medications  Medication Instructions   calcitRIOL  (ROCALTROL ) 1.25 mcg, Oral, Every T-Th-Sa (Hemodialysis)   cinacalcet  (SENSIPAR ) 30 mg, Oral, Every T-Th-Sa (Hemodialysis)   CREON  36000-114000 units CPEP capsule 36,000-72,000 Units, See admin instructions   cyanocobalamin  (VITAMIN B12) 1,000 mcg, Oral, Daily   diphenhydrAMINE  (BENADRYL ) 25 mg, Every 4 hours PRN   ferrous sulfate  325 mg, Daily with breakfast   FLUoxetine  (PROZAC ) 10 mg, Oral, Daily   LUMIGAN  0.01 % SOLN 1 drop, Both Eyes, Daily at bedtime   Methoxy PEG-Epoetin Beta (MIRCERA IJ) 30 mcg   mirtazapine  (REMERON ) 15 mg, Daily   montelukast  (SINGULAIR ) 10 mg, Daily   pantoprazole  (PROTONIX ) 40 mg, 2 times daily   QUEtiapine  Fumarate (SEROQUEL  XR) 300 mg, Daily at bedtime   sertraline  (ZOLOFT ) 100 mg, Oral, 2 times daily   sevelamer  carbonate (RENVELA ) 2,400 mg, Oral, 3 times daily with meals   sildenafil (VIAGRA) 100 mg, As needed   timolol  (TIMOPTIC ) 0.5 % ophthalmic solution 1 drop, Both Eyes, Every morning   Travoprost , BAK Free, (TRAVATAN ) 0.004 % SOLN ophthalmic solution 1 drop, Both Eyes, Daily at bedtime   TYLENOL  500-1,000 mg, Every 6 hours PRN    Allergies[1]  Past Medical History:  Diagnosis Date   Anxiety    Arthritis    Chronic pancreatitis (HCC)    Depression    Dialysis patient    Dyspnea    ESRD (end stage renal  disease) (HCC)    on HD (T,Th,Sat)   Glaucoma    Hypertension    Prostate cancer (HCC)    PUD (peptic ulcer disease)      Past Surgical History:  Procedure Laterality Date   APPENDECTOMY     BIOPSY  10/15/2023   Procedure: BIOPSY;  Surgeon: Leigh Elspeth SQUIBB, MD;  Location: [facility] ENDOSCOPY;  Service: Gastroenterology;;   DIALYSIS/PERMA CATHETER INSERTION N/A 09/23/2023   Procedure: DIALYSIS/PERMA CATHETER INSERTION;  Surgeon: Melia Lynwood ORN, MD;  Location: [facility] INVASIVE CV [facility];  Service: Cardiovascular;  Laterality: N/A;   DIALYSIS/PERMA CATHETER REMOVAL N/A 09/23/2023   Procedure: DIALYSIS/PERMA CATHETER REMOVAL;  Surgeon: Melia Lynwood ORN, MD;  Location: [facility] INVASIVE CV LAB;  Service: Cardiovascular;  Laterality: N/A;   ESOPHAGOGASTRODUODENOSCOPY (EGD) WITH PROPOFOL  N/A 10/15/2023   Procedure: ESOPHAGOGASTRODUODENOSCOPY (EGD) WITH PROPOFOL ;  Surgeon: Leigh Elspeth SQUIBB, MD;  Location: [facility] ENDOSCOPY;  Service: Gastroenterology;  Laterality: N/A;   growth in nostril     biopsied negatiove   IR FLUORO GUIDE CV LINE RIGHT  05/12/2020   IR FLUORO GUIDE CV LINE RIGHT  05/28/2020   IR FLUORO GUIDE CV LINE RIGHT  02/09/2024   IR REMOVAL TUN CV CATH W/O FL  02/07/2024   IR US  GUIDE VASC ACCESS RIGHT  05/12/2020   IR US  GUIDE VASC ACCESS RIGHT  05/28/2020   IR US  GUIDE VASC ACCESS RIGHT  02/09/2024   TRANSURETHRAL RESECTION OF PROSTATE N/A 09/13/2020   Procedure: TRANSURETHRAL RESECTION OF  THE PROSTATE (TURP) WITH CYSTOSCOPY;  Surgeon: Renda Glance, MD;  Location: [facility] ORS;  Service: Urology;  Laterality: N/A;   TUNNELLED CATHETER EXCHANGE N/A 04/04/2024   Procedure: TUNNELLED CATHETER EXCHANGE;  Surgeon: Magda Debby SAILOR, MD;  Location: [facility];  Service: Cardiovascular;  Laterality: N/A;    Family History  Problem Relation Age of Onset   Stroke Mother    Breast cancer Neg Hx    Colon cancer Neg Hx    Prostate cancer Neg Hx    Pancreatic cancer Neg Hx     Social History   Occupational  History   Occupation: art gallery manager  Tobacco Use   Smoking status: Never    Passive exposure: Never   Smokeless tobacco: Never  Vaping Use   Vaping status: Never Used  Substance and Sexual Activity   Alcohol use: Not Currently    Alcohol/week: 1.0 standard drink of alcohol    Types: 1 Glasses of wine per week    Comment: rare   Drug use: Not Currently   Sexual activity: Not Currently    Birth control/protection: None     ROS   Objective:   Vitals: BP (!) 142/79 (BP Location: Right Arm)   Pulse (!) 104   Temp 99.4 F (37.4 C) (Oral)   Resp 18   SpO2 94%   Physical Exam Constitutional:      General: He is not in acute distress.    Appearance: Normal appearance. He is well-developed and normal weight. He is not ill-appearing, toxic-appearing or diaphoretic.  HENT:     Head: Normocephalic and atraumatic.     Right Ear: External ear normal.     Left Ear: External ear normal.     Nose: Nose normal.     Mouth/Throat:     Pharynx: Oropharynx is clear.  Eyes:     General: No scleral icterus.       Right eye: No discharge.        Left eye: No discharge.     Extraocular Movements: Extraocular movements intact.  Cardiovascular:     Rate and Rhythm: Normal rate.  Pulmonary:     Effort: Pulmonary effort is normal.  Musculoskeletal:     Cervical back: Normal range of motion.  Neurological:     Mental Status: He is alert and oriented to person, place, and time.  Psychiatric:        Mood and Affect: Mood normal.        Behavior: Behavior normal.        Thought Content: Thought content normal.        Judgment: Judgment normal.     CT Cervical Spine Wo Contrast Result Date: 11/17/2024 EXAM: CT CERVICAL SPINE WITHOUT CONTRAST 11/17/2024 08:43:00 AM TECHNIQUE: CT of the cervical spine was performed without the administration of intravenous contrast. Multiplanar reformatted images are provided for review. Automated exposure control, iterative reconstruction, and/or weight based  adjustment of the mA/kV was utilized to reduce the radiation dose to as low as reasonably achievable. COMPARISON: CT head and face reported separately today. CT chest, abdomen, and pelvis 07/28/2024. CLINICAL HISTORY: 74 year old male. Neck trauma. Fell on ice while going to dialysis. FINDINGS: BONES AND ALIGNMENT: Straightening of cervical lordosis. Mild T2 inferior endplate compression is chronic and stable. No acute fracture or traumatic malalignment. DEGENERATIVE CHANGES: Advanced chronic disc and endplate degeneration throughout the cervical spine. Subsequent multifactorial spinal stenosis appears maximal at C3-C4, likely moderate (series 5, image 53). SOFT TISSUES: Right lower neck, right  IJ associated dual lumen dialysis type catheter is partially visible. Otherwise negative visible non-contrast thoracic inlet. Carotid atherosclerosis. Negative visible non-contrast deep soft tissue spaces of the neck. No prevertebral soft tissue swelling. IMPRESSION: 1. No acute traumatic injury identified in the cervical spine. 2. Advanced chronic disc and endplate degeneration with likely moderate spinal stenosis at C3-C4. Electronically signed by: Helayne Hurst MD 11/17/2024 09:04 AM EST RP Workstation: [id number]   CT Maxillofacial WO CM Result Date: 11/17/2024 EXAM: CT OF THE FACE WITHOUT CONTRAST 11/17/2024 08:43:00 AM TECHNIQUE: CT of the face was performed without the administration of intravenous contrast. Multiplanar reformatted images are provided for review. Automated exposure control, iterative reconstruction, and/or weight based adjustment of the mA/kV was utilized to reduce the radiation dose to as low as reasonably achievable. COMPARISON: CT head and cervical spine reported separately today. CT paranasal sinuses 02/04/2024. CLINICAL HISTORY: 74 year old male. Fell on ice while going to dialysis. Head trauma, moderate-severe. FINDINGS: FACIAL BONES: No acute osseous abnormality identified. Orbital walls appear  intact. ORBITS: Globes are intact. No acute traumatic injury. No inflammatory change. SINUSES AND MASTOIDS: Advanced chronic paranasal sinus disease with subtotal opacification due to mucoperiosteal thickening in the left maxillary and right frontal sinuses not changed from last year. No superimposed layering sinus hemorrhage is identified. There is an unchanged small volume inflammatory appearing layering right maxillary sinus fluid. Visible tympanic cavities and mastoids are aerated. SOFT TISSUES: Broad based subcutaneous hematoma / contusion overlying the left face below the malar eminence (series 3 image 59). Underlying left masticator space remains within normal limits. Negative visible non-contrast larynx, pharynx, peripharyngeal spaces, retropharyngeal space, sublingual space, submandibular spaces, masticator and parotid spaces. No soft tissue gas. IMPRESSION: 1. Subcutaneous hematoma/contusion of the left face. 2. No acute facial fracture. 3. Advanced chronic paranasal sinus disease stable from last year. Electronically signed by: Helayne Hurst MD 11/17/2024 08:59 AM EST RP Workstation: [id number]   DG Elbow Complete Left Result Date: 11/17/2024 EXAM: 4 VIEW(S) XRAY OF THE LEFT ELBOW COMPARISON: None available. CLINICAL HISTORY: 73 year old male. Fell on ice while going to dialysis. FINDINGS: BONES AND JOINTS: Subtle cortical disruption of the radial neck. No malalignment. SOFT TISSUES: Small elbow joint effusion. Otherwise unremarkable. IMPRESSION: 1. Nondisplaced left radial neck fracture.  Small left elbow joint effusion. Electronically signed by: Helayne Hurst MD 11/17/2024 08:55 AM EST RP Workstation: [id number]   CT Head Wo Contrast Result Date: 11/17/2024 EXAM: CT HEAD WITHOUT CONTRAST 11/17/2024 08:43:00 AM TECHNIQUE: CT of the head was performed without the administration of intravenous contrast. Automated exposure control, iterative reconstruction, and/or weight based adjustment of the mA/kV was  utilized to reduce the radiation dose to as low as reasonably achievable. COMPARISON: CT face and cervical spine reported separately today. Brain MRI 05/05/2023. CLINICAL HISTORY: 74 year old male. Head trauma, moderate-severe. Fell on ice while going to dialysis. FINDINGS: BRAIN AND VENTRICLES: No acute hemorrhage. No evidence of acute infarct. No hydrocephalus. No extra-axial collection. No mass effect or midline shift. Brain volume is stable since December 18, 2022. Patchy bilateral cerebral white matter hypodensity is moderate and stable. No suspicious intracranial vascular hyperdensity. Calcified atherosclerosis at the skull base. ORBITS: No acute abnormality. SINUSES: Chronic paranasal sinus disease appears stable since 18-Dec-2022. Pronounced chronic mucoperiosteal thickening in the left maxillary and right frontal sinuses. Tympanic cavities and mastoids are well aerated. SOFT TISSUES AND SKULL: No acute soft tissue abnormality. No skull fracture. IMPRESSION: 1. No acute traumatic injury identified. 2. 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 3. No new intracranial  abnormality. Electronically signed by: Helayne Hurst MD 11/17/2024 08:54 AM EST RP Workstation: [id number]     Assessment and Plan :   I have reviewed the PDMP during this encounter.  1. Left elbow pain   2. Closed nondisplaced fracture of neck of left radius, initial encounter    Scheduled Tylenol , use of oxycodone  for breakthrough pain.  Follow-up with the orthopedist as previously recommended.  Counseled patient on potential for adverse effects with medications prescribed/recommended today, ER and return-to-clinic precautions discussed, patient verbalized understanding.      [1] No Known Allergies    Christopher Savannah, [place] 11/17/24 [id number]  "

## 2024-11-18 ENCOUNTER — Ambulatory Visit (HOSPITAL_BASED_OUTPATIENT_CLINIC_OR_DEPARTMENT_OTHER): Admitting: Student

## 2024-11-18 ENCOUNTER — Ambulatory Visit (HOSPITAL_BASED_OUTPATIENT_CLINIC_OR_DEPARTMENT_OTHER)

## 2024-11-18 DIAGNOSIS — G8929 Other chronic pain: Secondary | ICD-10-CM

## 2024-11-18 NOTE — Telephone Encounter (Signed)
Pt was seen at [facility] yesterday.

## 2024-11-21 ENCOUNTER — Ambulatory Visit (HOSPITAL_COMMUNITY): Admitting: Student in an Organized Health Care Education/Training Program

## 2024-11-25 ENCOUNTER — Encounter: Admitting: Physician Assistant

## 2024-12-02 ENCOUNTER — Ambulatory Visit (HOSPITAL_BASED_OUTPATIENT_CLINIC_OR_DEPARTMENT_OTHER): Admitting: Student

## 2024-12-08 ENCOUNTER — Encounter: Admitting: Physician Assistant
